# Patient Record
Sex: Male | Born: 1940
Health system: Southern US, Community
[De-identification: ages and names within clinical notes are randomized; demographics above are authoritative.]

## PROBLEM LIST (undated history)

## (undated) DIAGNOSIS — I82409 Acute embolism and thrombosis of unspecified deep veins of unspecified lower extremity: Secondary | ICD-10-CM

## (undated) DIAGNOSIS — I10 Essential (primary) hypertension: Secondary | ICD-10-CM

## (undated) DIAGNOSIS — M1990 Unspecified osteoarthritis, unspecified site: Secondary | ICD-10-CM

## (undated) DIAGNOSIS — I350 Nonrheumatic aortic (valve) stenosis: Secondary | ICD-10-CM

## (undated) DIAGNOSIS — I739 Peripheral vascular disease, unspecified: Secondary | ICD-10-CM

## (undated) DIAGNOSIS — R079 Chest pain, unspecified: Secondary | ICD-10-CM

## (undated) DIAGNOSIS — L98499 Non-pressure chronic ulcer of skin of other sites with unspecified severity: Secondary | ICD-10-CM

## (undated) DIAGNOSIS — H9319 Tinnitus, unspecified ear: Secondary | ICD-10-CM

## (undated) DIAGNOSIS — Z8719 Personal history of other diseases of the digestive system: Secondary | ICD-10-CM

## (undated) DIAGNOSIS — R011 Cardiac murmur, unspecified: Secondary | ICD-10-CM

## (undated) DIAGNOSIS — Z973 Presence of spectacles and contact lenses: Secondary | ICD-10-CM

## (undated) DIAGNOSIS — IMO0002 Reserved for concepts with insufficient information to code with codable children: Secondary | ICD-10-CM

## (undated) DIAGNOSIS — I2699 Other pulmonary embolism without acute cor pulmonale: Secondary | ICD-10-CM

## (undated) DIAGNOSIS — I4891 Unspecified atrial fibrillation: Secondary | ICD-10-CM

## (undated) DIAGNOSIS — Z972 Presence of dental prosthetic device (complete) (partial): Secondary | ICD-10-CM

## (undated) DIAGNOSIS — Z86718 Personal history of other venous thrombosis and embolism: Secondary | ICD-10-CM

## (undated) DIAGNOSIS — I639 Cerebral infarction, unspecified: Secondary | ICD-10-CM

## (undated) DIAGNOSIS — N400 Enlarged prostate without lower urinary tract symptoms: Secondary | ICD-10-CM

## (undated) DIAGNOSIS — M329 Systemic lupus erythematosus, unspecified: Secondary | ICD-10-CM

## (undated) DIAGNOSIS — K219 Gastro-esophageal reflux disease without esophagitis: Secondary | ICD-10-CM

## (undated) DIAGNOSIS — B029 Zoster without complications: Secondary | ICD-10-CM

## (undated) HISTORY — DX: Chest pain, unspecified: R07.9

## (undated) HISTORY — PX: HERNIA REPAIR: SHX51

## (undated) HISTORY — DX: Benign prostatic hyperplasia without lower urinary tract symptoms: N40.0

## (undated) HISTORY — PX: KNEE ARTHROSCOPY: SUR90

## (undated) HISTORY — DX: Peripheral vascular disease, unspecified: I73.9

## (undated) HISTORY — DX: Nonrheumatic aortic (valve) stenosis: I35.0

## (undated) HISTORY — PX: IVC FILTER INSERTION: CATH118245

## (undated) HISTORY — DX: Essential (primary) hypertension: I10

## (undated) HISTORY — DX: Personal history of other venous thrombosis and embolism: Z86.718

## (undated) HISTORY — PX: MULTIPLE TOOTH EXTRACTIONS: SHX2053

## (undated) HISTORY — PX: OTHER SURGICAL HISTORY: SHX169

---

## 2006-05-22 ENCOUNTER — Ambulatory Visit (HOSPITAL_COMMUNITY): Admission: RE | Admit: 2006-05-22 | Discharge: 2006-05-22 | Payer: Self-pay | Admitting: General Surgery

## 2007-06-19 ENCOUNTER — Ambulatory Visit (HOSPITAL_BASED_OUTPATIENT_CLINIC_OR_DEPARTMENT_OTHER): Admission: RE | Admit: 2007-06-19 | Discharge: 2007-06-19 | Payer: Self-pay | Admitting: Orthopedic Surgery

## 2008-10-01 DIAGNOSIS — Z8673 Personal history of transient ischemic attack (TIA), and cerebral infarction without residual deficits: Secondary | ICD-10-CM

## 2008-10-01 DIAGNOSIS — I639 Cerebral infarction, unspecified: Secondary | ICD-10-CM

## 2008-10-01 HISTORY — DX: Personal history of transient ischemic attack (TIA), and cerebral infarction without residual deficits: Z86.73

## 2008-10-01 HISTORY — DX: Cerebral infarction, unspecified: I63.9

## 2009-03-13 ENCOUNTER — Ambulatory Visit: Payer: Self-pay | Admitting: Cardiovascular Disease

## 2009-03-13 ENCOUNTER — Inpatient Hospital Stay (HOSPITAL_COMMUNITY): Admission: EM | Admit: 2009-03-13 | Discharge: 2009-03-16 | Payer: Self-pay | Admitting: Cardiology

## 2009-03-13 ENCOUNTER — Encounter: Payer: Self-pay | Admitting: Cardiology

## 2009-03-14 ENCOUNTER — Encounter: Payer: Self-pay | Admitting: Cardiology

## 2009-03-24 ENCOUNTER — Telehealth (INDEPENDENT_AMBULATORY_CARE_PROVIDER_SITE_OTHER): Payer: Self-pay

## 2009-03-28 ENCOUNTER — Encounter: Payer: Self-pay | Admitting: Cardiovascular Disease

## 2009-03-28 ENCOUNTER — Ambulatory Visit: Payer: Self-pay

## 2009-03-30 ENCOUNTER — Ambulatory Visit: Payer: Self-pay | Admitting: Cardiology

## 2009-06-07 ENCOUNTER — Encounter: Admission: RE | Admit: 2009-06-07 | Discharge: 2009-06-07 | Payer: Self-pay | Admitting: Rheumatology

## 2009-06-22 DIAGNOSIS — R079 Chest pain, unspecified: Secondary | ICD-10-CM

## 2009-06-22 DIAGNOSIS — I10 Essential (primary) hypertension: Secondary | ICD-10-CM | POA: Insufficient documentation

## 2009-11-25 ENCOUNTER — Ambulatory Visit (HOSPITAL_COMMUNITY): Admission: RE | Admit: 2009-11-25 | Discharge: 2009-11-25 | Payer: Self-pay | Admitting: General Surgery

## 2010-12-20 LAB — BASIC METABOLIC PANEL
Calcium: 9.5 mg/dL (ref 8.4–10.5)
GFR calc Af Amer: >60 mL/min (ref >60)
GFR calc non Af Amer: >60 mL/min (ref >60)
Sodium: 138 mEq/L (ref 135–145)

## 2010-12-20 LAB — CBC
Hemoglobin: 12.8 g/dL — ABNORMAL LOW (ref 13.0–17.0)
RBC: 4.3 MIL/uL (ref 4.22–5.81)
RDW: 15.6 % — ABNORMAL HIGH (ref 11.5–15.5)
WBC: 4.8 10*3/uL (ref 4.0–10.5)

## 2010-12-20 LAB — APTT: aPTT: 33 seconds (ref 24–37)

## 2010-12-20 LAB — PROTIME-INR: INR: 1.16 (ref 0.00–1.49)

## 2011-01-08 LAB — LIPID PANEL
HDL: 23 mg/dL — ABNORMAL LOW (ref >39)
LDL Cholesterol: 69 mg/dL (ref 0–99)
Total CHOL/HDL Ratio: 4.5 RATIO
Triglycerides: 54 mg/dL (ref <150)
VLDL: 11 mg/dL (ref 0–40)

## 2011-01-08 LAB — CARDIAC PANEL(CRET KIN+CKTOT+MB+TROPI)
CK, MB: 8 ng/mL — ABNORMAL HIGH (ref 0.3–4.0)
Relative Index: 6.5 — ABNORMAL HIGH (ref 0.0–2.5)
Relative Index: 6.8 — ABNORMAL HIGH (ref 0.0–2.5)
Total CK: 164 U/L (ref 7–232)
Troponin I: 0.01 ng/mL (ref 0.00–0.06)

## 2011-01-08 LAB — URINALYSIS, ROUTINE W REFLEX MICROSCOPIC
Nitrite: NEGATIVE
Specific Gravity, Urine: 1.023 (ref 1.005–1.030)
Urobilinogen, UA: 0.2 mg/dL (ref 0.0–1.0)
pH: 6 (ref 5.0–8.0)

## 2011-01-08 LAB — COMPREHENSIVE METABOLIC PANEL
ALT: 39 U/L (ref 0–53)
Alkaline Phosphatase: 57 U/L (ref 39–117)
CO2: 26 mEq/L (ref 19–32)
Calcium: 8.9 mg/dL (ref 8.4–10.5)
GFR calc non Af Amer: >60 mL/min (ref >60)
Glucose, Bld: 135 mg/dL — ABNORMAL HIGH (ref 70–99)
Potassium: 3.7 mEq/L (ref 3.5–5.1)
Sodium: 135 mEq/L (ref 135–145)

## 2011-01-08 LAB — BASIC METABOLIC PANEL
BUN: 10 mg/dL (ref 6–23)
BUN: 19 mg/dL (ref 6–23)
Calcium: 8.1 mg/dL — ABNORMAL LOW (ref 8.4–10.5)
GFR calc Af Amer: >60 mL/min (ref >60)
GFR calc non Af Amer: >60 mL/min (ref >60)
GFR calc non Af Amer: >60 mL/min (ref >60)
Glucose, Bld: 111 mg/dL — ABNORMAL HIGH (ref 70–99)
Potassium: 3.5 mEq/L (ref 3.5–5.1)
Potassium: 3.6 mEq/L (ref 3.5–5.1)
Sodium: 138 mEq/L (ref 135–145)
Sodium: 133 mEq/L — ABNORMAL LOW (ref 135–145)

## 2011-01-08 LAB — CBC
HCT: 32.7 % — ABNORMAL LOW (ref 39.0–52.0)
Hemoglobin: 11.3 g/dL — ABNORMAL LOW (ref 13.0–17.0)
MCHC: 34.2 g/dL (ref 30.0–36.0)
MCHC: 34.5 g/dL (ref 30.0–36.0)
Platelets: 140 10*3/uL — ABNORMAL LOW (ref 150–400)
RDW: 14.9 % (ref 11.5–15.5)
RDW: 15.2 % (ref 11.5–15.5)

## 2011-01-08 LAB — ANA: Anti Nuclear Antibody(ANA): POSITIVE — AB

## 2011-01-08 LAB — DIFFERENTIAL
Basophils Absolute: 0 10*3/uL (ref 0.0–0.1)
Basophils Absolute: 0 10*3/uL (ref 0.0–0.1)
Basophils Relative: 0 % (ref 0–1)
Basophils Relative: 0 % (ref 0–1)
Eosinophils Relative: 2 % (ref 0–5)
Lymphocytes Relative: 15 % (ref 12–46)
Monocytes Absolute: 0.8 10*3/uL (ref 0.1–1.0)
Neutro Abs: 6.4 10*3/uL (ref 1.7–7.7)
Neutrophils Relative %: 76 % (ref 43–77)

## 2011-01-08 LAB — PROTIME-INR: Prothrombin Time: 14.1 seconds (ref 11.6–15.2)

## 2011-01-08 LAB — HEPARIN LEVEL (UNFRACTIONATED)
Heparin Unfractionated: 0.23 IU/mL — ABNORMAL LOW (ref 0.30–0.70)
Heparin Unfractionated: 0.43 IU/mL (ref 0.30–0.70)

## 2011-01-08 LAB — CULTURE, BLOOD (ROUTINE X 2)

## 2011-01-08 LAB — MAGNESIUM: Magnesium: 2 mg/dL (ref 1.5–2.5)

## 2011-01-08 LAB — ANTI-NUCLEAR AB-TITER (ANA TITER)

## 2011-02-13 NOTE — Op Note (Signed)
NAME:  Jose Bush, Jose Bush NO.:  [id number]   MEDICAL RECORD NO.:  [id number]          PATIENT TYPE:  AMB   LOCATION:  [facility]                         FACILITY:  [facility]   PHYSICIAN:  Marlowe Kays, M.D.  DATE OF BIRTH:  July 20, 1941   DATE OF PROCEDURE:  06/19/2007  DATE OF DISCHARGE:                               OPERATIVE REPORT   PREOPERATIVE DIAGNOSES:  1. Torn medial meniscus.  2. Osteoarthritis right knee.   POSTOPERATIVE DIAGNOSES:  1. Torn medial meniscus.  2. Osteoarthritis right knee.   OPERATION:  Right knee arthroscopy with  1. Partial medial meniscectomy.  2. Generalized joint debridement and removal of several floaters.   SURGEON:  Marlowe Kays, M.D.   ASSISTANT:  Nurse.   ANESTHESIA:  General anesthesia, Burnett Corrente, M.D.   DESCRIPTION OF PROCEDURE:  He has bilaterally painful knees.  Recent MRI  of the right knee has demonstrated osteoarthritis and a posterior horn  tear of the medial meniscus.  Accordingly, he is here today for the  above-mentioned surgery.  It was thoroughly discussed with him and his  wife that the arthroscopic surgery was designed to take care of the  problems created by the torn medial meniscus and his knee may not be  appreciably improved with regard to the arthritis.   PROCEDURE:  Satisfactory general anesthesia, Ace wrap and knee support  to left lower extremity, pneumatic tourniquet to right lower extremity,  the leg Esmarch out non sterilely, thigh stabilizer applied.  Right leg  was prepped with DuraPrep from stabilizer to ankle and draped in a  sterile field.  Time-out performed.  Superior medial saline inflow.  First through an anteromedial portal, the lateral compartment of the  knee joint was evaluated.  His lateral joint was quite tight.  He had  some grade 1 to 2 wear of the lateral femoral condyle and lateral tibial  plateau, both of which I debrided down.  Looking up the lateral gutter  and  suprapatellar area, he had at least one loose fragment of articular  cartilage floating which I removed and of good bit of wear on the  superior portion of the lateral femoral condyle and the intercondylar  notch which I shaved down.  Pictures were taken. His patella did not  require any treatment.  I then reversed portals.  He had some scuffing  of the anterior third of the medial meniscus which was stable on  probing.  He had grade 3/4 wear of the medial femoral condyle which I  shaved down until smooth.  Posteriorly, he had significant fraying of a  major portion of the posterior half of the medial meniscus which I  shaved down to a stable rim on probing.  The knee joint was then  irrigated until clear and all fluid possible removed.  The drainage  portals were closed with 4-0 nylon.  I injected 20 mL of 0.25% Marcaine  with adrenalin and 4 mg of  morphine through the inflow apparatus which was removed and this portal  closed with 4-0 nylon as well.  Betadine and Adaptic and dry  sterile  dressing were applied.  Tourniquet was released.  He tolerated the  procedure well and at time of dictation was on the way to the recovery  room satisfactory condition with no known complications.           ______________________________  Marlowe Kays, M.D.     JA/MEDQ  D:  06/19/2007  T:  06/19/2007  Job:  [id number]

## 2011-02-13 NOTE — Discharge Summary (Signed)
NAME:  Jose Bush, Jose Bush NO.:  [id number]   MEDICAL RECORD NO.:  [id number]          PATIENT TYPE:  INP   LOCATION:  6526                         FACILITY:  [facility]   PHYSICIAN:  Rollene Rotunda, MD, FACCDATE OF BIRTH:  1940-11-12   DATE OF ADMISSION:  03/13/2009  DATE OF DISCHARGE:  03/16/2009                               DISCHARGE SUMMARY   PROCEDURES:  1. CT angiogram of the chest.  2. A 2-D echocardiogram.   PRIMARY FINAL DISCHARGE DIAGNOSIS:  Pneumonia.   SECONDARY DIAGNOSES:  1. Atypical chest pain with elevated CK-MBs and normal troponins,      outpatient stress testing plans.  2. Remote history of peptic ulcer disease.  3. Family history of coronary artery disease in his brother.  4. Remote history of tobacco use.  5. Hypertension.  6. Hyponatremia, resolved.   TIME OF DISCHARGE:  35 minutes.   HOSPITAL COURSE:  Jose Bush is a 70 year old male with no previous  history of coronary artery disease.  He went to [facility] with  left-sided chest pain and was transferred to [facility] for further  evaluation and treatment.   His chest x-ray showed possible pneumonia.  There was concern for PE  because his pain was atypical, so a CT angiogram of the chest was  performed.  There was no evidence of PE, but he had emphysema with  dependent, left greater than right lower lobe airspace opacities felt to  represent atelectasis or pneumonia with small amount of pleural fluid on  the left.  He was febrile and was started on antibiotics.   His fevers gradually improved.  He was coughing some, but this gradually  improved.  An ANA was positive and CRP was also elevated at 6.3.  This  could be followed as an outpatient.  His BNP was less than 30.  A sed  rate was also mildly elevated at 28.  A lipid profile showed total  cholesterol 103, triglycerides 54, HDL 23, LDL 69, so increasing his  activity is preferable with no medical therapy at this time.  An ANA  titer was 1:320.  The pattern was speckled.  The result may be  clinically significant and this will be followed as an outpatient.   By March 16, 2009, his O2 saturation was 90% or better on room air even  with ambulation.  He had no temperatures greater than 100 Fahrenheit.  His hyponatremia had resolved.  He had a beta-blocker added to his  medication regimen and was tolerating this well.  He was evaluated by  Dr. Antoine Poche who felt he could be changed to p.o. antibiotics (from  Zosyn to Avelox) and safely discharged home with outpatient stress  testing and followup.   DISCHARGE INSTRUCTIONS:  1. His activity level is to be increased gradually.  2. He is to do no lifting for 2 weeks.  3. He is to stick to a low-sodium heart-healthy diet.  4. He is to follow up with Dr. Antoine Poche in [place] on April 18, 2009      at 4:15.  5. He is to follow  up with his family physician as needed.  6. He is to get a stress test on March 28, 2009 at 8 a.m. with no food      or drink after midnight or before, but meds are okay with a sip of      water.   DISCHARGE MEDICATIONS:  1. Prilosec 20 mg daily.  2. Avelox 400 mg daily for 5 days.  3. Toprol-XL 25 mg a day.  4. Aspirin 81 mg a day.  5. Lisinopril HCT 20/25 mg daily.      Theodore Demark, PA-C      Rollene Rotunda, MD, [facility]  Electronically Signed    RB/MEDQ  D:  03/16/2009  T:  03/17/2009  Job:  [id number]

## 2011-02-13 NOTE — H&P (Signed)
NAME:  Jose Bush, Jose Bush NO.:  [id number]   MEDICAL RECORD NO.:  [id number]          PATIENT TYPE:  INP   LOCATION:  2910                         FACILITY:  [facility]   PHYSICIAN:  Brayton El, MD    DATE OF BIRTH:  12-May-1941   DATE OF ADMISSION:  03/13/2009  DATE OF DISCHARGE:                              HISTORY & PHYSICAL   CHIEF COMPLAINT:  Chest pain.   HISTORY OF PRESENT ILLNESS:  Mr. Jose Bush is a 70 year old white male with  past medical history significant for hypertension and history of tobacco  use who is presenting upon transfer from [facility] after 12  hours of unremitting left-sided chest discomfort.  The patient states  that the pain started this morning over his left anterior and lateral  chest.  He states that it has been sharp in nature and unremitting.  It  does not radiate and does not have any associated symptoms.  The patient  states that it is significantly worsened by deep inspiration.  The  patient denies any history of similar pain prior to today and has  otherwise been in his normal state of health.  Specifically, he denies  any lower extremity edema, PND, orthopnea, fevers, chills, cough,  hematochezia or melena.  In [facility] emergency room, the patient was  given aspirin, bolused with heparin and started on a heparin drip for  possible acute coronary syndrome as the patient's CK-MB was 19.  However, the patient's troponin was 0.01.  His EKG at that time was  unremarkable.  Upon arrival here to [facility], the patient complaining  of persistent 7/10 left-sided chest discomfort that has been somewhat  responsive to morphine and nitroglycerin.   PAST MEDICAL HISTORY:  As above.  In addition, the patient states he was  diagnosed with ulcer several years ago but has not had any difficulty  with that recently.   FAMILY HISTORY:  He has a brother who had bypass surgery in his early  51s.  However, there does not appear to be premature  coronary disease.   SOCIAL HISTORY:  Patient has extensive smoking history.  However, he  quit smoking in 1985.  He does not drink alcohol.   ALLERGIES:  NO KNOWN DRUG ALLERGIES ALTHOUGH HE STATES THAT HE THINKS HE  WAS ALLERGIC TO A TETANUS SHOT IN THE PAST.   MEDICATIONS:  The patient takes Prilosec and a blood pressure medicine  that he is unable to name.   REVIEW OF SYSTEMS:  As in HPI.  All other systems were reviewed and are  negative.   PHYSICAL EXAMINATION:  VITAL SIGNS:  Temperature 100.8, pulse 83, blood  pressure 138/77.  He is satting 96% on 2 liters nasal cannula.  GENERAL:  He is mildly uncomfortable appearing.  HEENT:  Nonfocal.  NECK:  There is no JVD.  There are no carotid bruits.  There is no  lymphadenopathy.  HEART:  Regular rate and rhythm without murmur, rub or gallop.  S1-S2  are normal.  LUNGS: There are bibasilar crackles.  ABDOMEN:  Soft, nontender, nondistended.  EXTREMITIES:  Without  edema.  SKIN:  Warm and dry.  NEUROLOGICAL:  Exam is nonfocal.  Pulses:  The patient is 2+ bilateral  carotid and radial pulses.  PSYCHIATRIC:  The patient is appropriate with normal levels of insight.   LABORATORY DATA:  From the outside hospital, sodium 135, potassium 4.1,  chloride 102, BUN 10, creatinine 0.77, glucose 102.  White count 7.7,  hemoglobin 14.0 rheumatic 40.7, platelet count 170.  His total CK was  219, CK-MB was 19.7.  His troponin was 0.01.  EKG independently  interpreted by myself demonstrates some baseline artifact, normal sinus  rhythm, and nonspecific T-wave abnormality.   ASSESSMENT:  A 70 year old white male with tobacco use and hypertension  as risk factors for coronary disease presenting with atypical chest  pain, an increased CK-MB, normal troponin and unremarkable EKG.  The  differential diagnosis includes acute coronary syndrome, pulmonary  embolism, dissection or musculoskeletal origin.  The patient's  symptomatology is not really  consistent with acute coronary syndrome.  However, it does remain on the differential.  The patient's low grade  temperature does raise concern for infectious etiology such as  pneumonia.  However, low grade fever could also be secondary to  pulmonary embolism.   PLAN:  1. Cardiac.  We will rule the patient out for myocardial infarction.      He has received aspirin, heparin bolus and is on the nitroglycerin      drip.  If the patient's troponin were to become positive, we would      initiate dual antiplatelet therapy by adding on a 2b/3 inhibitor.      Will also, at this time, add a statin to his medical regimen.  If      the patient were to rule in with positive troponin (which he should      upon next lab draw if this is indeed secondary to acute coronary      syndrome), he would be a suitable candidate for left heart      catheterization in the morning.  Aortic dissection is on the      differential, but much more lower down.  We will, however, check      blood pressures in bilateral arms at this time.  2. Pulmonary.  If the patient's first troponin is not significantly      positive, we will order a contrasted CT scan of the chest in order      to rule out pulmonary embolism.  A CT scan will also be helpful to      identify any potential infiltrate or pleural effusion that could be      the source of the patient's chest pain and/or low grade fever.  The      patient is currently on heparin which would be treating a pulmonary      embolism if indeed this is the etiology for his pain.  3. Low grade fever.  The patient has a temperature of 100.8.  This may      be secondary to an infectious etiology or prior pulmonary embolism.      We will check blood cultures x2, urinalysis and start with a chest      x-ray.  If the patient's pulmonary imaging reveals an infiltrate,      we will start antibiotic coverage for community-acquired pneumonia.  4. Prophylaxis.  The patient is currently on  IV heparin which will      service as his DVT prophylaxis.  Brayton El, MD  Electronically Signed     SGA/MEDQ  D:  03/13/2009  T:  03/14/2009  Job:  [id number]

## 2011-02-13 NOTE — Assessment & Plan Note (Signed)
[facility]                          [facility] OFFICE NOTE   Jose Bush, Jose Bush                      MRN:          [id number]  DATE:03/30/2009                            DOB:          [birth year]    REASON FOR PRESENTATION:  Evaluate the patient with chest pain.   HISTORY OF PRESENT ILLNESS:  The patient was hospitalized on March 13, 2009, with chest discomfort.  He had a negative troponin though his CK-  MB was slightly elevated during that hospitalization.  However, he had  no evidence of EKG changes consistent with obstructive coronary artery  disease.  His pain was quite pleuritic in nature.  There was a  questionable pneumonia on chest x-ray and he was treated for this with  initial improvement in his chest pain.  He was relatively painfree at  the time of discharge.  He did have an angiogram, which demonstrated no  evidence of pulmonary embolism.  Since discharge, he did have a stress  perfusion study demonstrating well-preserved ejection fraction of 70%  with no evidence of ischemia or infarct.  Of note, the patient did have  an echocardiogram as well on March 14, 2009, demonstrating no pericardial  effusion or regional wall motion abnormalities.   Since going home, he has had some left-sided chest pain.  He points to  the small lump under his skin.  He describes that from there, which is  an area below his left rib in axillary line, he has pain that radiates  up his chest.  It can be 5/6 in intensity.  It was severe the night  before this presentation.  It is positional.  It seems to be better when  he lies flat.  It is worse lying on his left side.  He has taken some  Tylenol to get rid of this.  He is noticing at night, but not during the  day.  He cannot bring it on with activity.  Three days ago, he thought  he had a low grade temperature and chills.  He has had mild cough  nonproductive.  He is not short of breath.  He is able to  use his  spirometer at home without difficulties.  He has not had any nausea or  vomiting.  He has had no edema or weight gain.   PAST MEDICAL HISTORY:  1. Peptic ulcer disease.  2. Hypertension.  3. Previous hyponatremia.   ALLERGIES:  None, although he thought he had a reaction to TETANUS SHOT  in the past.   MEDICATIONS:  Prilosec 20 mg over-the-counter, Toprol 25 mg daily,  aspirin 81 mg daily, lisinopril and hydrochlorothiazide 20/25 daily,  multivitamin, saw palmetto, fish oil.   REVIEW OF SYSTEMS:  As stated in the HPI, otherwise negative for all  other systems.   PHYSICAL EXAMINATION:  GENERAL:  The patient is in no distress.  VITAL SIGNS:  Blood pressure 130/72, heart rate 85 and regular, 96%  saturation on room air, weight 200 pounds.  HEENT:  Eyelids unremarkable.  Pupils equal, round, and reactive to  light.  Fundi  not visualized.  Oral mucosa unremarkable.  NECK:  No jugular venous distention at 45 degrees.  Carotid upstroke  brisk and symmetrical.  No bruits.  No thyromegaly.  LYMPHATICS:  No cervical, axillary, inguinal adenopathy.  LUNGS:  Clear to auscultation bilaterally.  BACK:  No costovertebral angle tenderness.  CHEST:  There is a slight soft 2 x 3 cm nodule in the axillary line  below the ribs, most likely a lipoma.  HEART:  PMI not displaced or sustained.  S1 and S2 within normal limits.  No S3.  No S4.  No clicks.  No rubs.  No murmurs.  ABDOMEN:  Flat, positive bowel sounds, normal in frequency and pitch.  No bruits.  No rebound, guarding, or midline pulsatile mass.  No  hepatomegaly or splenomegaly.  SKIN:  No rashes.  No nodules.  EXTREMITIES:  2+ pulses throughout.  No edema.  No cyanosis.  No  clubbing.  NEUROLOGIC:  Oriented to person, place, and time.  Cranial nerves II  through XII grossly intact.  Motor grossly intact.   EKG:  Sinus rhythm, rate 83, axis within normal limits, intervals within  normal limits, probable early repolarization  pattern, no acute ST-T wave  changes.   ASSESSMENT AND PLAN:  1. Chest discomfort.  The patient has chest discomfort.  He does not      have any anginal quality to it.  He has a negative stress perfusion      study.  For now, it seems to be more musculoskeletal.  However, if      this persists and there is no other clear etiology, I would want to      reevaluate him.  I do not believe there is any evidence of      pericarditis.  There is no rub.  There are no clear ST changes on      EKG though there is a slight early repolarization pattern.  I will      discuss this with Dr. Dimas Aguas.  I have asked the patient to call and      have a followup with him.  If his pain persists or recurs, I would      be happy to see him back.  2. Hypertension.  Blood pressure is well controlled and he will      continue the medications as listed.     Rollene Rotunda, MD, [facility]  Electronically Signed    JH/MedQ  DD: 03/30/2009  DT: 03/31/2009  Job #: [id number]

## 2011-02-16 NOTE — H&P (Signed)
NAME:  KEYGAN, DUMOND NO.:  [id number]   MEDICAL RECORD NO.:  [id number]         PATIENT TYPE:  AMB   LOCATION:                                FACILITY:  [facility]   PHYSICIAN:  Dalia Heading, M.D.  DATE OF BIRTH:  1940/10/25   DATE OF ADMISSION:  DATE OF DISCHARGE:  LH                                HISTORY & PHYSICAL   CHIEF COMPLAINT:  Dysphagia.   HISTORY OF PRESENT ILLNESS:  The patient is a 70 year old white male who is  referred for endoscopic evaluation.  He needs an EGD for dysphagia.  He has  been having dysphagia with some solid food intermittently for over a year.  He does not smoke.  Does have a history of GERD.  He has never been treated  for this.  No abdominal pain, weight loss, nausea, vomiting, diarrhea,  constipation, melena, hematochezia have been noted.  He has had a  colonoscopy in the past.   CURRENT MEDICATIONS:  None.   ALLERGIES:  NO KNOWN DRUG ALLERGIES.   PAST MEDICAL HISTORY:  Unremarkable.   PAST SURGICAL HISTORY:  Unremarkable.   REVIEW OF SYSTEMS:  Noncontributory.   PHYSICAL EXAMINATION:  GENERAL APPEARANCE:  The patient is a well-developed,  well-nourished white male in no acute distress.  NECK:  Neck is supple without lymphadenopathy.  LUNGS:  Clear to auscultation with equal breath sounds bilaterally.  HEART:  Examination reveals a regular rate and rhythm without S3, S4 or  murmurs.  ABDOMEN:  The abdomen is soft, nontender, nondistended.  No  hepatosplenomegaly or masses noted.   IMPRESSION:  Dysphagia.   PLAN:  The patient is scheduled for an EGD with possible dilatation on  May 22, 2006.  The risks and benefits of the procedure including bleeding  and perforation were fully explained to the patient, gave informed consent.      Dalia Heading, M.D.  Electronically Signed     MAJ/MEDQ  D:  05/16/2006  T:  05/16/2006  Job:  [id number]   cc:   Selinda Flavin  Fax: [phone]

## 2011-03-28 ENCOUNTER — Encounter: Payer: Self-pay | Admitting: Cardiology

## 2011-07-12 LAB — I-STAT 8, (EC8 V) (CONVERTED LAB)
BUN: 21
Bicarbonate: 22.6
Glucose, Bld: 93
TCO2: 24
pCO2, Ven: 31.2 — ABNORMAL LOW
pH, Ven: 7.469 — ABNORMAL HIGH

## 2012-11-28 ENCOUNTER — Other Ambulatory Visit (HOSPITAL_COMMUNITY): Payer: Self-pay | Admitting: Rheumatology

## 2012-12-02 ENCOUNTER — Ambulatory Visit (HOSPITAL_COMMUNITY)
Admission: RE | Admit: 2012-12-02 | Discharge: 2012-12-02 | Disposition: A | Discharge status: Home / Self Care | Payer: Medicare Other | Source: Ambulatory Visit | Attending: Rheumatology | Admitting: Rheumatology

## 2012-12-02 DIAGNOSIS — Z13820 Encounter for screening for osteoporosis: Secondary | ICD-10-CM | POA: Insufficient documentation

## 2012-12-02 DIAGNOSIS — IMO0002 Reserved for concepts with insufficient information to code with codable children: Secondary | ICD-10-CM | POA: Insufficient documentation

## 2013-02-01 ENCOUNTER — Encounter (HOSPITAL_COMMUNITY): Payer: Self-pay | Admitting: Emergency Medicine

## 2013-02-01 ENCOUNTER — Emergency Department (HOSPITAL_COMMUNITY): Payer: Medicare Other

## 2013-02-01 ENCOUNTER — Inpatient Hospital Stay (HOSPITAL_COMMUNITY)
Admission: EM | Admit: 2013-02-01 | Discharge: 2013-02-06 | DRG: 803 | Disposition: A | Discharge status: Home / Self Care | Payer: Medicare Other | Attending: Internal Medicine | Admitting: Internal Medicine

## 2013-02-01 DIAGNOSIS — M329 Systemic lupus erythematosus, unspecified: Secondary | ICD-10-CM | POA: Diagnosis present

## 2013-02-01 DIAGNOSIS — R5383 Other fatigue: Secondary | ICD-10-CM

## 2013-02-01 DIAGNOSIS — IMO0002 Reserved for concepts with insufficient information to code with codable children: Secondary | ICD-10-CM | POA: Diagnosis present

## 2013-02-01 DIAGNOSIS — D696 Thrombocytopenia, unspecified: Secondary | ICD-10-CM | POA: Diagnosis present

## 2013-02-01 DIAGNOSIS — D709 Neutropenia, unspecified: Principal | ICD-10-CM | POA: Diagnosis present

## 2013-02-01 DIAGNOSIS — R5081 Fever presenting with conditions classified elsewhere: Secondary | ICD-10-CM | POA: Diagnosis present

## 2013-02-01 DIAGNOSIS — I82409 Acute embolism and thrombosis of unspecified deep veins of unspecified lower extremity: Secondary | ICD-10-CM | POA: Diagnosis present

## 2013-02-01 DIAGNOSIS — R509 Fever, unspecified: Secondary | ICD-10-CM

## 2013-02-01 DIAGNOSIS — R410 Disorientation, unspecified: Secondary | ICD-10-CM

## 2013-02-01 DIAGNOSIS — Z8249 Family history of ischemic heart disease and other diseases of the circulatory system: Secondary | ICD-10-CM

## 2013-02-01 DIAGNOSIS — R270 Ataxia, unspecified: Secondary | ICD-10-CM

## 2013-02-01 DIAGNOSIS — D61818 Other pancytopenia: Secondary | ICD-10-CM | POA: Diagnosis present

## 2013-02-01 DIAGNOSIS — R6889 Other general symptoms and signs: Secondary | ICD-10-CM | POA: Diagnosis present

## 2013-02-01 DIAGNOSIS — R51 Headache: Secondary | ICD-10-CM | POA: Diagnosis present

## 2013-02-01 DIAGNOSIS — R531 Weakness: Secondary | ICD-10-CM | POA: Diagnosis present

## 2013-02-01 DIAGNOSIS — R519 Headache, unspecified: Secondary | ICD-10-CM | POA: Diagnosis present

## 2013-02-01 DIAGNOSIS — Z87891 Personal history of nicotine dependence: Secondary | ICD-10-CM

## 2013-02-01 DIAGNOSIS — Z86718 Personal history of other venous thrombosis and embolism: Secondary | ICD-10-CM

## 2013-02-01 DIAGNOSIS — I82402 Acute embolism and thrombosis of unspecified deep veins of left lower extremity: Secondary | ICD-10-CM

## 2013-02-01 DIAGNOSIS — Z7901 Long term (current) use of anticoagulants: Secondary | ICD-10-CM

## 2013-02-01 DIAGNOSIS — I10 Essential (primary) hypertension: Secondary | ICD-10-CM | POA: Diagnosis present

## 2013-02-01 HISTORY — DX: Systemic lupus erythematosus, unspecified: M32.9

## 2013-02-01 HISTORY — DX: Reserved for concepts with insufficient information to code with codable children: IMO0002

## 2013-02-01 HISTORY — DX: Acute embolism and thrombosis of unspecified deep veins of unspecified lower extremity: I82.409

## 2013-02-01 LAB — CBC WITH DIFFERENTIAL/PLATELET
Basophils Relative: 1 % (ref 0–1)
Eosinophils Absolute: 0 10*3/uL (ref 0.0–0.7)
Eosinophils Relative: 0 % (ref 0–5)
HCT: 38.1 % — ABNORMAL LOW (ref 39.0–52.0)
Hemoglobin: 13.3 g/dL (ref 13.0–17.0)
MCH: 30.4 pg (ref 26.0–34.0)
MCHC: 34.9 g/dL (ref 30.0–36.0)
MCV: 87.2 fL (ref 78.0–100.0)
Monocytes Absolute: 0.2 10*3/uL (ref 0.1–1.0)
Monocytes Relative: 5 % (ref 3–12)
Neutro Abs: 3.6 10*3/uL (ref 1.7–7.7)
RDW: 15.1 % (ref 11.5–15.5)

## 2013-02-01 LAB — URINALYSIS, ROUTINE W REFLEX MICROSCOPIC
Bilirubin Urine: NEGATIVE
Hgb urine dipstick: NEGATIVE
Leukocytes, UA: NEGATIVE
Nitrite: NEGATIVE
Protein, ur: NEGATIVE mg/dL
Specific Gravity, Urine: 1.019 (ref 1.005–1.030)

## 2013-02-01 LAB — COMPREHENSIVE METABOLIC PANEL
ALT: 53 U/L (ref 0–53)
AST: 61 U/L — ABNORMAL HIGH (ref 0–37)
Albumin: 3.4 g/dL — ABNORMAL LOW (ref 3.5–5.2)
CO2: 22 mEq/L (ref 19–32)
Calcium: 9 mg/dL (ref 8.4–10.5)
Chloride: 100 mEq/L (ref 96–112)
GFR calc non Af Amer: 82 mL/min — ABNORMAL LOW (ref >90)
Sodium: 134 mEq/L — ABNORMAL LOW (ref 135–145)

## 2013-02-01 LAB — PROTIME-INR: INR: 1.72 — ABNORMAL HIGH (ref 0.00–1.49)

## 2013-02-01 LAB — POCT I-STAT TROPONIN I: Troponin i, poc: 0.01 ng/mL (ref 0.00–0.08)

## 2013-02-01 MED ORDER — WARFARIN SODIUM 2.5 MG PO TABS
2.5000 mg | ORAL_TABLET | Freq: Once | ORAL | Status: AC
  Administered 2013-02-02: 2.5 mg via ORAL
  Filled 2013-02-01: qty 1

## 2013-02-01 MED ORDER — SODIUM CHLORIDE 0.9 % IV SOLN
INTRAVENOUS | Status: DC
Start: 2013-02-01 — End: 2013-02-06
  Administered 2013-02-02 – 2013-02-03 (×3): via INTRAVENOUS
  Administered 2013-02-04: 1000 mL via INTRAVENOUS
  Administered 2013-02-05: 23:00:00 via INTRAVENOUS

## 2013-02-01 MED ORDER — WARFARIN - PHARMACIST DOSING INPATIENT
Freq: Every day | Status: DC
  Administered 2013-02-02: 19:00:00

## 2013-02-01 MED ORDER — FISH OIL 1200 MG PO CAPS: 1.0000 | ORAL_CAPSULE | Freq: Every morning | ORAL | Status: DC

## 2013-02-01 MED ORDER — DOXYCYCLINE HYCLATE 100 MG PO TABS
100.0000 mg | ORAL_TABLET | Freq: Two times a day (BID) | ORAL | Status: DC
  Administered 2013-02-02 – 2013-02-04 (×6): 100 mg via ORAL
  Filled 2013-02-01 (×7): qty 1

## 2013-02-01 MED ORDER — NITROGLYCERIN 0.4 MG SL SUBL: 0.4000 mg | SUBLINGUAL_TABLET | SUBLINGUAL | Status: DC | PRN

## 2013-02-01 MED ORDER — OMEGA-3-ACID ETHYL ESTERS 1 G PO CAPS
1.0000 g | ORAL_CAPSULE | Freq: Every day | ORAL | Status: DC
  Administered 2013-02-02 – 2013-02-06 (×5): 1 g via ORAL
  Filled 2013-02-01 (×5): qty 1

## 2013-02-01 MED ORDER — LISINOPRIL 10 MG PO TABS
10.0000 mg | ORAL_TABLET | Freq: Every day | ORAL | Status: DC
  Administered 2013-02-02 – 2013-02-06 (×5): 10 mg via ORAL
  Filled 2013-02-01 (×5): qty 1

## 2013-02-01 MED ORDER — PANTOPRAZOLE SODIUM 40 MG PO TBEC
40.0000 mg | DELAYED_RELEASE_TABLET | Freq: Every day | ORAL | Status: DC
  Administered 2013-02-02 – 2013-02-06 (×5): 40 mg via ORAL
  Filled 2013-02-01 (×5): qty 1

## 2013-02-01 MED ORDER — HYDROXYCHLOROQUINE SULFATE 200 MG PO TABS
200.0000 mg | ORAL_TABLET | Freq: Two times a day (BID) | ORAL | Status: DC
  Administered 2013-02-02 – 2013-02-04 (×5): 200 mg via ORAL
  Filled 2013-02-01 (×6): qty 1

## 2013-02-01 NOTE — H&P (Signed)
[facility] History and Physical  Jose Bush ZOX:[id number] DOB: 07-14-1941 DOA: 02/01/2013  Referring physician: ER Physician. PCP: Selinda Flavin, MD  Specialists: Dr. Kellie Simmering rheumatologist.  Chief Complaint: Weakness.  HPI: Jose Bush is a 72 y.o. male history of lupus, DVT on Coumadin, hypertension was brought to the ER after patient has been having weakness headache. Earlier today around 8:30 AM patient had gone to the church and over there patient was found to be confused weak and slow in doing things. Patient's gait was not normal. But patient did not lose consciousness or did not have any loss of function of the upper or lower extremities. Patient has been experiencing headache and neck pain for the last 3 days and subjective feeling of chills and rigors. His headache is mostly on the frontal area. Denies any associated blurred vision difficulty speaking or swallowing. Patient states that he has been experiencing chills and rigors last 2 nights but there was no recorded fever. Denies any chest pain has had mild shortness of breath denies any productive cough nausea vomiting abdominal pain diarrhea or dysuria. Denies any insect bites. In the ER patient was found to be afebrile and nonfocal and his symptoms are largely resolved. CT head was negative for any acute. Labs revealed mild thrombocytopenia. UA chest x-ray unremarkable and EKG shows normal sinus rhythm. Patient at this time will be admitted for further management.  Patient has history of lupus and was under remission until 3 months ago when he started having chest pain and as per patient chest x-ray showed infiltrates and patient was placed on steroids and was eventually started on Plaquenil and is taken off steroids recently last one month ago.  Review of Systems: As presented in the history of presenting illness, rest negative.  Past Medical History  Diagnosis Date  . HTN (hypertension)   . Chest pain, unspecified    . DVT (deep venous thrombosis)   . Lupus    Past Surgical History  Procedure Laterality Date  . Hernia repair     Social History:  reports that he has quit smoking. He does not have any smokeless tobacco history on file. He reports that he does not drink alcohol or use illicit drugs. Lives at home. where does patient live-- Can do ADLs. Can patient participate in ADLs?  No Known Allergies  Family History  Problem Relation Age of Onset  . Hypertension Other   . Stroke Mother       Prior to Admission medications   Medication Sig Start Date End Date Taking? Authorizing Provider  Calcium Carbonate-Vitamin D (CALCIUM + D PO) Take 1 tablet by mouth every morning.   Yes Historical Provider, MD  hydroxychloroquine (PLAQUENIL) 200 MG tablet Take 200 mg by mouth 2 (two) times daily.   Yes Historical Provider, MD  lisinopril-hydrochlorothiazide (PRINZIDE,ZESTORETIC) 10-12.5 MG per tablet Take 1 tablet by mouth every morning.   Yes Historical Provider, MD  Multiple Vitamin (MULTIVITAMIN WITH MINERALS) TABS Take 1 tablet by mouth every morning.   Yes Historical Provider, MD  nitroGLYCERIN (NITROSTAT) 0.4 MG SL tablet Place 0.4 mg under the tongue every 5 (five) minutes as needed for chest pain. x3 doses as needed for chest pain   Yes Historical Provider, MD  Omega-3 Fatty Acids (FISH OIL) 1200 MG CAPS Take 1 capsule by mouth every morning.   Yes Historical Provider, MD  omeprazole (PRILOSEC) 20 MG capsule Take 20 mg by mouth every morning.   Yes Historical Provider, MD  warfarin (COUMADIN) 5 MG tablet Take 5 mg by mouth every morning.   Yes Historical Provider, MD   Physical Exam: Filed Vitals:   02/01/13 2000 02/01/13 2100 02/01/13 2130 02/01/13 2200  BP:  104/47 126/69 137/73  Pulse: 75 67 73 81  Temp:      TempSrc:      Resp: 21 26 24 25   SpO2: 96% 97% 99% 99%     General:  Well-developed and nourished.  Eyes: Anicteric no pallor.  ENT: No discharge from the ears eyes nose or  mouth.  Neck: No neck rigidity. No mass felt.  Cardiovascular: S1-S2 heard.  Respiratory: No rhonchi or crepitations.  Abdomen: Soft nontender bowel sounds present.  Skin: No rash.  Musculoskeletal: No edema.  Psychiatric: Appears normal.  Neurologic: Alert awake oriented to time place and person. Moves all extremities 5 x 5. No facial symmetry. Tongue is midline. PERRLA positive.  Labs on Admission:  Basic Metabolic Panel:  Recent Labs Lab 02/01/13 1734  NA 134*  K 3.7  CL 100  CO2 22  GLUCOSE 138*  BUN 21  CREATININE 0.94  CALCIUM 9.0   Liver Function Tests:  Recent Labs Lab 02/01/13 1734  AST 61*  ALT 53  ALKPHOS 46  BILITOT 0.6  PROT 6.8  ALBUMIN 3.4*   No results found for this basename: LIPASE, AMYLASE,  in the last 168 hours No results found for this basename: AMMONIA,  in the last 168 hours CBC:  Recent Labs Lab 02/01/13 1734  WBC 4.1  NEUTROABS 3.6  HGB 13.3  HCT 38.1*  MCV 87.2  PLT 100*   Cardiac Enzymes: No results found for this basename: CKTOTAL, CKMB, CKMBINDEX, TROPONINI,  in the last 168 hours  BNP (last 3 results) No results found for this basename: PROBNP,  in the last 8760 hours CBG: No results found for this basename: GLUCAP,  in the last 168 hours  Radiological Exams on Admission: Dg Chest 2 View  02/01/2013  *RADIOLOGY REPORT*  Clinical Data: Altered mental status.  CHEST - 2 VIEW  Comparison: 03/25/2012  Findings: Heart is normal size.  Stable mild chronic linear densities of the lungs, likely scarring.  No acute opacities or effusions.  No acute bony abnormality.  IMPRESSION: No acute cardiopulmonary disease.   Original Report Authenticated By: Charlett Nose, M.D.    Ct Head Wo Contrast  02/01/2013  *RADIOLOGY REPORT*  Clinical Data: Headache, dizziness  CT HEAD WITHOUT CONTRAST  Technique:  Contiguous axial images were obtained from the base of the skull through the vertex without contrast.  Comparison: Brain MRI 01/05/2010   Findings: No skull fracture is noted.  Paranasal sinuses mastoid air cells are unremarkable.  No intracranial hemorrhage, mass effect or midline shift.  No acute infarction.  No mass lesion is noted on this unenhanced scan.  Moderate cerebral atrophy. Small lacunar infarct left parietal lobe posteriorly is stable.  Stable periventricular and patchy subcortical chronic white matter disease.  IMPRESSION: No acute intracranial abnormality.  Stable atrophy and chronic white matter disease.   Original Report Authenticated By: Natasha Mead, M.D.     EKG: Independently reviewed. Normal sinus rhythm.  Assessment/Plan Principal Problem:   Weakness Active Problems:   HYPERTENSION, UNSPECIFIED   DVT (deep venous thrombosis)   Rigors   Headache   Lupus   Thrombocytopenia   1. Weakness with chills and rigors - patient is afebrile and nonfocal. At this time we have to primarily rule out any infectious source for  which blood cultures have been ordered. Since patient also has thrombocytopenia I have ordered Lyme titers and [facility] spotted fever titers and placed patient empirically on doxycycline. Given the history of lupus we will also check for any Lupus exacerbation for which C3 and C4 levels along with sedimentation rate has been ordered. TIA/stroke is also on the differentials. I have discussed with neurologist on-call Dr. Amada Jupiter. At this time they have recommended MRI brain and if positive for strokes in consult neurology. Since patient has generalized weakness and nonfocal this time stroke may be less likely but will be in the differential. Closely follow neuro checks and swallow evaluation. 2. Thrombocytopenia - see #1. Closely follow CBC with differentials. Patient is on doxycycline for now. Patient's headache is mild and on exam patient has no neck rigidity and patient is afebrile as explained earlier. Closely follow clinically. 3. Hypertension  - continue home medications. 4. History of DVT  - Coumadin per pharmacy.    Code Status: Full code.  Family Communication: Patient's wife at the bedside.  Disposition Plan: Admit to inpatient.    Cordera Stineman N. [facility] Pager [phone].  If 7PM-7AM, please contact night-coverage [email or web address] Password [facility] 02/01/2013, 10:21 PM

## 2013-02-01 NOTE — Progress Notes (Signed)
ANTICOAGULATION CONSULT NOTE - Initial Consult  Pharmacy Consult for warfarin Indication: H/o DVT  No Known Allergies  Patient Measurements:    Vital Signs: Temp: 98.4 F (36.9 C) (05/04 1700) Temp src: Oral (05/04 1700) BP: 137/73 mmHg (05/04 2200) Pulse Rate: 81 (05/04 2200)  Labs:  Recent Labs  02/01/13 1734  HGB 13.3  HCT 38.1*  PLT 100*  LABPROT 19.6*  INR 1.72*  CREATININE 0.94    The CrCl is unknown because both a height and weight (above a minimum accepted value) are required for this calculation.   Medical History: Past Medical History  Diagnosis Date  . HTN (hypertension)   . Chest pain, unspecified   . DVT (deep venous thrombosis)   . Lupus     Medications:  Scheduled:  . doxycycline  100 mg Oral Q12H  . [START ON 02/02/2013] hydroxychloroquine  200 mg Oral BID  . [START ON 02/02/2013] lisinopril  10 mg Oral Daily  . [START ON 02/02/2013] omega-3 acid ethyl esters  1 g Oral Daily  . [START ON 02/02/2013] pantoprazole  40 mg Oral Daily  . [DISCONTINUED] Fish Oil  1 capsule Oral q morning - 10a    Assessment: 72 yo male with h/o DVT (2010) presented with weakness and headache. Pharmacy to manage Coumadin. INR 1.72. Home Coumadin regimen of 5mg  daily with last dose taken earlier today.   Goal of Therapy:  INR 2-3 Monitor platelets by anticoagulation protocol: Yes   Plan:  1. Coumadin 2.5mg  po today (total of 7.5mg  Coumadin for today) 2. Daily PT / INR 3. Coumadin education with pharmacist 4. Could consider bridging with heparin until INR is at-goal.  Emeline Gins 02/01/2013,11:23 PM

## 2013-02-01 NOTE — ED Provider Notes (Signed)
History     CSN: [id number]  Arrival date & time 02/01/13  1654   First MD Initiated Contact with Patient 02/01/13 1702      Chief Complaint  Patient presents with  . Weakness  . Headache    (Consider location/radiation/quality/duration/timing/severity/associated sxs/prior treatment) Patient is a 72 y.o. male presenting with weakness and headaches. The history is provided by the patient.  Weakness Associated symptoms include chest pain and headaches. Pertinent negatives include no abdominal pain and no shortness of breath.  Headache Associated symptoms: fatigue and nausea   Associated symptoms: no abdominal pain, no back pain, no diarrhea, no pain, no neck stiffness, no numbness and no vomiting    patient states he's been feeling sick for the last 3 days. He states he has had a dull headache. He states it comes and goes. States will come on and go away. He states he's also had episodes of shaking in his whole body. He states he will come and go. He states he feels as if he has chills. States he also has had some confusion. His wife states that his been having more difficulty picking out clothes than he normally would.no difficulty seen. No cough. He states he has had some urinary frequency. No abdominal pain. He states he has had some occasional dull chest pain. He is on Coumadin for previous DVT. He is also had difficulty walking. Family member states he was shuffling in his gait.  Past Medical History  Diagnosis Date  . HTN (hypertension)   . Chest pain, unspecified   . DVT (deep venous thrombosis)     History reviewed. No pertinent past surgical history.  Family History  Problem Relation Age of Onset  . Hypertension Other     History  Substance Use Topics  . Smoking status: Former Games developer  . Smokeless tobacco: Not on file  . Alcohol Use: No      Review of Systems  Constitutional: Positive for chills and fatigue. Negative for activity change and appetite change.  HENT:  Negative for neck stiffness.   Eyes: Negative for pain.  Respiratory: Negative for chest tightness and shortness of breath.   Cardiovascular: Positive for chest pain. Negative for leg swelling.  Gastrointestinal: Positive for nausea. Negative for vomiting, abdominal pain and diarrhea.  Genitourinary: Negative for flank pain.  Musculoskeletal: Negative for back pain.  Skin: Negative for rash.  Neurological: Positive for weakness and headaches. Negative for numbness.  Psychiatric/Behavioral: Negative for behavioral problems.    Allergies  Review of patient's allergies indicates no known allergies.  Home Medications   Current Outpatient Rx  Name  Route  Sig  Dispense  Refill  . Calcium Carbonate-Vitamin D (CALCIUM + D PO)   Oral   Take 1 tablet by mouth every morning.         . hydroxychloroquine (PLAQUENIL) 200 MG tablet   Oral   Take 200 mg by mouth 2 (two) times daily.         Marland Kitchen lisinopril-hydrochlorothiazide (PRINZIDE,ZESTORETIC) 10-12.5 MG per tablet   Oral   Take 1 tablet by mouth every morning.         . Multiple Vitamin (MULTIVITAMIN WITH MINERALS) TABS   Oral   Take 1 tablet by mouth every morning.         . nitroGLYCERIN (NITROSTAT) 0.4 MG SL tablet   Sublingual   Place 0.4 mg under the tongue every 5 (five) minutes as needed for chest pain. x3 doses as needed for chest  pain         . Omega-3 Fatty Acids (FISH OIL) 1200 MG CAPS   Oral   Take 1 capsule by mouth every morning.         Marland Kitchen omeprazole (PRILOSEC) 20 MG capsule   Oral   Take 20 mg by mouth every morning.         . warfarin (COUMADIN) 5 MG tablet   Oral   Take 5 mg by mouth every morning.           BP 126/65  Pulse 69  Temp(Src) 98.4 F (36.9 C) (Oral)  Resp 26  SpO2 97%  Physical Exam  Nursing note and vitals reviewed. Constitutional: He is oriented to person, place, and time. He appears well-developed and well-nourished.  HENT:  Head: Normocephalic and atraumatic.   Bilateral TMs normal  Eyes: EOM are normal. Pupils are equal, round, and reactive to light.  Neck: Normal range of motion. Neck supple.  No meningeal signs.  Cardiovascular: Normal rate, regular rhythm and normal heart sounds.   No murmur heard. Pulmonary/Chest: Effort normal and breath sounds normal.  Abdominal: Soft. Bowel sounds are normal. He exhibits no distension and no mass. There is no tenderness. There is no rebound and no guarding.  Musculoskeletal: Normal range of motion. He exhibits edema.  Minimal bilateral lower extremity pitting edema.  Neurological: He is alert and oriented to person, place, and time. No cranial nerve deficit.  No nystagmus. Finger-nose negative bilaterally. No Romberg.  Skin: Skin is warm and dry.  Psychiatric: He has a normal mood and affect.    ED Course  Procedures (including critical care time)  Labs Reviewed  CBC WITH DIFFERENTIAL - Abnormal; Notable for the following:    HCT 38.1 (*)    Platelets 100 (*)    Neutrophils Relative 87 (*)    Lymphocytes Relative 8 (*)    Lymphs Abs 0.3 (*)    All other components within normal limits  COMPREHENSIVE METABOLIC PANEL - Abnormal; Notable for the following:    Sodium 134 (*)    Glucose, Bld 138 (*)    Albumin 3.4 (*)    AST 61 (*)    GFR calc non Af Amer 82 (*)    All other components within normal limits  PROTIME-INR - Abnormal; Notable for the following:    Prothrombin Time 19.6 (*)    INR 1.72 (*)    All other components within normal limits  URINALYSIS, ROUTINE W REFLEX MICROSCOPIC  POCT I-STAT TROPONIN I   Dg Chest 2 View  02/01/2013  *RADIOLOGY REPORT*  Clinical Data: Altered mental status.  CHEST - 2 VIEW  Comparison: 03/25/2012  Findings: Heart is normal size.  Stable mild chronic linear densities of the lungs, likely scarring.  No acute opacities or effusions.  No acute bony abnormality.  IMPRESSION: No acute cardiopulmonary disease.   Original Report Authenticated By: Charlett Nose,  M.D.    Ct Head Wo Contrast  02/01/2013  *RADIOLOGY REPORT*  Clinical Data: Headache, dizziness  CT HEAD WITHOUT CONTRAST  Technique:  Contiguous axial images were obtained from the base of the skull through the vertex without contrast.  Comparison: Brain MRI 01/05/2010  Findings: No skull fracture is noted.  Paranasal sinuses mastoid air cells are unremarkable.  No intracranial hemorrhage, mass effect or midline shift.  No acute infarction.  No mass lesion is noted on this unenhanced scan.  Moderate cerebral atrophy. Small lacunar infarct left parietal lobe posteriorly is stable.  Stable periventricular and patchy subcortical chronic white matter disease.  IMPRESSION: No acute intracranial abnormality.  Stable atrophy and chronic white matter disease.   Original Report Authenticated By: Natasha Mead, M.D.      No diagnosis found.   Date: 02/01/2013  Rate: 93  Rhythm: normal sinus rhythm  QRS Axis: normal  Intervals: normal  ST/T Wave abnormalities: normal  Conduction Disutrbances:none  Narrative Interpretation:   Old EKG Reviewed: none available    MDM  Patient presents with some altered mental status, headache, and previous shuffling gait. Lab work is overall reassuring. Head CT is negative. With previous altered mental status and also the shuffling gait patient will need evaluation by internal medicine and likely admission.        Juliet Rude. Rubin Payor, MD 02/04/13 [phone]

## 2013-02-01 NOTE — ED Notes (Signed)
Pt sts balance problems with frontal HA and chills x 3 days; pt sts recent treatment for DVT on left side and having some weakness per norm in that leg; no obvious neuro deficits

## 2013-02-01 NOTE — ED Notes (Signed)
Pt reports since Friday night he started to get chills and generalized weakness, then started to feel a little better yesterday but then last night it came back. This morning having same symptoms, but felt like he was walking slower than normal and unable to walk as far. Pt in nad, skin warm and dry, resp e/u.

## 2013-02-02 ENCOUNTER — Inpatient Hospital Stay (HOSPITAL_COMMUNITY): Payer: Medicare Other

## 2013-02-02 DIAGNOSIS — F29 Unspecified psychosis not due to a substance or known physiological condition: Secondary | ICD-10-CM

## 2013-02-02 DIAGNOSIS — M329 Systemic lupus erythematosus, unspecified: Secondary | ICD-10-CM

## 2013-02-02 DIAGNOSIS — R509 Fever, unspecified: Secondary | ICD-10-CM | POA: Diagnosis present

## 2013-02-02 DIAGNOSIS — D709 Neutropenia, unspecified: Secondary | ICD-10-CM | POA: Diagnosis present

## 2013-02-02 LAB — RAPID URINE DRUG SCREEN, HOSP PERFORMED
Barbiturates: NOT DETECTED
Tetrahydrocannabinol: NOT DETECTED

## 2013-02-02 LAB — CBC WITH DIFFERENTIAL/PLATELET
Basophils Absolute: 0 10*3/uL (ref 0.0–0.1)
HCT: 37.2 % — ABNORMAL LOW (ref 39.0–52.0)
Lymphocytes Relative: 16 % (ref 12–46)
Lymphs Abs: 0.4 10*3/uL — ABNORMAL LOW (ref 0.7–4.0)
Neutro Abs: 1.8 10*3/uL (ref 1.7–7.7)
Platelets: 71 10*3/uL — ABNORMAL LOW (ref 150–400)
RBC: 4.29 MIL/uL (ref 4.22–5.81)
RDW: 15.1 % (ref 11.5–15.5)
WBC: 2.4 10*3/uL — ABNORMAL LOW (ref 4.0–10.5)

## 2013-02-02 LAB — COMPREHENSIVE METABOLIC PANEL
ALT: 65 U/L — ABNORMAL HIGH (ref 0–53)
AST: 79 U/L — ABNORMAL HIGH (ref 0–37)
Alkaline Phosphatase: 45 U/L (ref 39–117)
CO2: 25 mEq/L (ref 19–32)
Chloride: 101 mEq/L (ref 96–112)
GFR calc non Af Amer: 80 mL/min — ABNORMAL LOW (ref >90)
Glucose, Bld: 116 mg/dL — ABNORMAL HIGH (ref 70–99)
Sodium: 135 mEq/L (ref 135–145)
Total Bilirubin: 0.9 mg/dL (ref 0.3–1.2)

## 2013-02-02 LAB — HEMOGLOBIN A1C
Hgb A1c MFr Bld: 5.5 % (ref <5.7)
Mean Plasma Glucose: 111 mg/dL (ref <117)

## 2013-02-02 LAB — LIPID PANEL
Cholesterol: 124 mg/dL (ref 0–200)
VLDL: 25 mg/dL (ref 0–40)

## 2013-02-02 LAB — PROTIME-INR: INR: 1.5 — ABNORMAL HIGH (ref 0.00–1.49)

## 2013-02-02 MED ORDER — WARFARIN SODIUM 7.5 MG PO TABS
7.5000 mg | ORAL_TABLET | Freq: Once | ORAL | Status: AC
  Administered 2013-02-02: 7.5 mg via ORAL
  Filled 2013-02-02: qty 1

## 2013-02-02 MED ORDER — ACETAMINOPHEN 325 MG PO TABS
325.0000 mg | ORAL_TABLET | Freq: Once | ORAL | Status: AC
  Administered 2013-02-02: 325 mg via ORAL

## 2013-02-02 MED ORDER — ACETAMINOPHEN 325 MG PO TABS
650.0000 mg | ORAL_TABLET | Freq: Four times a day (QID) | ORAL | Status: DC | PRN
  Administered 2013-02-02 – 2013-02-05 (×4): 650 mg via ORAL
  Filled 2013-02-02 (×4): qty 2

## 2013-02-02 MED ORDER — ACETAMINOPHEN 325 MG PO TABS
975.0000 mg | ORAL_TABLET | Freq: Once | ORAL | Status: AC
  Administered 2013-02-02: 975 mg via ORAL
  Filled 2013-02-02: qty 3

## 2013-02-02 NOTE — Progress Notes (Signed)
Pt being observed to have two episodes of rigor with temperature of 98.5, Tyelenol given,reassured and will continue to monitor.

## 2013-02-02 NOTE — Progress Notes (Signed)
Pt's temp read 103 this morning,PA Randall Reidler( on call) paged and notified,ordered tab tylenol 925mg  same given at 0624,pt put on oxygen at 2LNC,will continue to monitor,family at bedside. Jose Bush, Jose Bush

## 2013-02-02 NOTE — Progress Notes (Signed)
ANTICOAGULATION CONSULT NOTE - Follow-up  Pharmacy Consult for warfarin Indication: H/o DVT  No Known Allergies  Patient Measurements: Height: 5\' 8"  (172.7 cm) Weight: 211 lb 13.8 oz (96.1 kg) IBW/kg (Calculated) : 68.4  Vital Signs: Temp: 98.9 F (37.2 C) (05/05 1120) Temp src: Oral (05/05 1120) BP: 116/59 mmHg (05/05 1120) Pulse Rate: 80 (05/05 1120)  Labs:  Recent Labs  02/01/13 1734 02/02/13 0530  HGB 13.3 13.1  HCT 38.1* 37.2*  PLT 100* 71*  LABPROT 19.6* 17.7*  INR 1.72* 1.50*  CREATININE 0.94 0.99    Estimated Creatinine Clearance: 75.8 ml/min (by C-G formula based on Cr of 0.99).  Assessment: 72 yo male with h/o DVT (2010) presented with weakness and headache. He continues on coumadin but his INR is low at 1.5. No bleeding noted. Pt is anemic and thromboctyopenic. Of note, he was started on doxycycline which may increase the INR.   Goal of Therapy:  INR 2-3   Plan:  1. Repeat coumadin 7.5mg  PO x 1 tonight 2. F/u AM INR  Lysle Pearl, PharmD, BCPS Pager # [phone] 02/02/2013 11:49 AM

## 2013-02-02 NOTE — Progress Notes (Signed)
[facility] PROGRESS NOTE Assessment/Plan: Neutropenia/ Fever/Weakness: -  Spiking fevers over night. - Vanc zosyn 5.4.2014. Neutropenic. - blood cultu pending, CXR, U/A negative till date. - C3 and C4 complement along with ESR pending.   - feels better today  Lupus - cont plaquenil.   Thrombocytopenia - stable, follow up with rheumatologist.   DVT (deep venous thrombosis) - cont coumadin.  HYPERTENSION, UNSPECIFIED - stable cont home meds.   Code Status: full Family Communication: none  Disposition Plan: inpatient 3-4 days.   Consultants:  none  Procedures:  CXR  CT head  BC    Antibiotics:  Vanc and zosyn 5.4.2014  HPI/Subjective: No complains feels better today  Objective: Filed Vitals:   02/01/13 2130 02/01/13 2200 02/02/13 0026 02/02/13 0554  BP: 126/69 137/73 138/68 127/66  Pulse: 73 81 91 98  Temp:   98.3 F (36.8 C) 103 F (39.4 C)  TempSrc:   Oral Oral  Resp: 24 25 26 24   Height:   5\' 8"  (1.727 m)   Weight:   96.1 kg (211 lb 13.8 oz)   SpO2: 99% 99% 99% 97%    Intake/Output Summary (Last 24 hours) at 02/02/13 1022 Last data filed at 02/02/13 0659  Gross per 24 hour  Intake    170 ml  Output    320 ml  Net   -150 ml   Filed Weights   02/02/13 0026  Weight: 96.1 kg (211 lb 13.8 oz)    Exam:  General: Alert, awake, oriented x3, in no acute distress.slow in his answers.  HEENT: No bruits, no goiter.  Heart: Regular rate and rhythm, without murmurs, rubs, gallops.  Lungs: Good air movement, bilateral air movement.  Abdomen: Soft, nontender, nondistended, positive bowel sounds.  Neuro: Grossly intact, nonfocal.   Data Reviewed: Basic Metabolic Panel:  Recent Labs Lab 02/01/13 1734 02/02/13 0530  NA 134* 135  K 3.7 4.1  CL 100 101  CO2 22 25  GLUCOSE 138* 116*  BUN 21 20  CREATININE 0.94 0.99  CALCIUM 9.0 8.6   Liver Function Tests:  Recent Labs Lab 02/01/13 1734 02/02/13 0530  AST 61* 79*  ALT 53  65*  ALKPHOS 46 45  BILITOT 0.6 0.9  PROT 6.8 6.5  ALBUMIN 3.4* 3.3*   No results found for this basename: LIPASE, AMYLASE,  in the last 168 hours No results found for this basename: AMMONIA,  in the last 168 hours CBC:  Recent Labs Lab 02/01/13 1734 02/02/13 0530  WBC 4.1 2.4*  NEUTROABS 3.6 1.8  HGB 13.3 13.1  HCT 38.1* 37.2*  MCV 87.2 86.7  PLT 100* 71*   Cardiac Enzymes: No results found for this basename: CKTOTAL, CKMB, CKMBINDEX, TROPONINI,  in the last 168 hours BNP (last 3 results) No results found for this basename: PROBNP,  in the last 8760 hours CBG: No results found for this basename: GLUCAP,  in the last 168 hours  No results found for this or any previous visit (from the past 240 hour(s)).   Studies: Dg Chest 2 View  02/01/2013  *RADIOLOGY REPORT*  Clinical Data: Altered mental status.  CHEST - 2 VIEW  Comparison: 03/25/2012  Findings: Heart is normal size.  Stable mild chronic linear densities of the lungs, likely scarring.  No acute opacities or effusions.  No acute bony abnormality.  IMPRESSION: No acute cardiopulmonary disease.   Original Report Authenticated By: Charlett Nose, M.D.    Ct Head Wo Contrast  02/01/2013  *RADIOLOGY REPORT*  Clinical Data: Headache, dizziness  CT HEAD WITHOUT CONTRAST  Technique:  Contiguous axial images were obtained from the base of the skull through the vertex without contrast.  Comparison: Brain MRI 01/05/2010  Findings: No skull fracture is noted.  Paranasal sinuses mastoid air cells are unremarkable.  No intracranial hemorrhage, mass effect or midline shift.  No acute infarction.  No mass lesion is noted on this unenhanced scan.  Moderate cerebral atrophy. Small lacunar infarct left parietal lobe posteriorly is stable.  Stable periventricular and patchy subcortical chronic white matter disease.  IMPRESSION: No acute intracranial abnormality.  Stable atrophy and chronic white matter disease.   Original Report Authenticated By: Natasha Mead, M.D.     Scheduled Meds: . doxycycline  100 mg Oral Q12H  . hydroxychloroquine  200 mg Oral BID  . lisinopril  10 mg Oral Daily  . omega-3 acid ethyl esters  1 g Oral Daily  . pantoprazole  40 mg Oral Daily  . Warfarin - Pharmacist Dosing Inpatient   Does not apply q1800   Continuous Infusions: . sodium chloride 75 mL/hr at 02/02/13 0008     [name]  [facility] Pager [phone]. If 8PM-8AM, please contact night-coverage at [email or web address], password [facility] 02/02/2013, 10:22 AM  LOS: 1 day

## 2013-02-03 ENCOUNTER — Inpatient Hospital Stay (HOSPITAL_COMMUNITY): Payer: Medicare Other

## 2013-02-03 ENCOUNTER — Encounter (HOSPITAL_COMMUNITY): Payer: Self-pay | Admitting: Radiology

## 2013-02-03 DIAGNOSIS — I82409 Acute embolism and thrombosis of unspecified deep veins of unspecified lower extremity: Secondary | ICD-10-CM

## 2013-02-03 LAB — GLUCOSE, CAPILLARY
Glucose-Capillary: 121 mg/dL — ABNORMAL HIGH (ref 70–99)
Glucose-Capillary: 89 mg/dL (ref 70–99)

## 2013-02-03 LAB — PROTIME-INR
INR: 1.29 (ref 0.00–1.49)
Prothrombin Time: 15.8 seconds — ABNORMAL HIGH (ref 11.6–15.2)

## 2013-02-03 MED ORDER — IOHEXOL 350 MG/ML SOLN
100.0000 mL | Freq: Once | INTRAVENOUS | Status: AC | PRN
  Administered 2013-02-03: 100 mL via INTRAVENOUS

## 2013-02-03 MED ORDER — ENOXAPARIN SODIUM 100 MG/ML ~~LOC~~ SOLN
100.0000 mg | Freq: Two times a day (BID) | SUBCUTANEOUS | Status: DC
  Administered 2013-02-03: 20 mg via SUBCUTANEOUS
  Filled 2013-02-03 (×3): qty 1

## 2013-02-03 MED ORDER — WARFARIN SODIUM 10 MG PO TABS
10.0000 mg | ORAL_TABLET | Freq: Once | ORAL | Status: AC
  Administered 2013-02-03: 10 mg via ORAL
  Filled 2013-02-03: qty 1

## 2013-02-03 MED ORDER — ENOXAPARIN SODIUM 40 MG/0.4ML ~~LOC~~ SOLN
40.0000 mg | SUBCUTANEOUS | Status: DC
  Administered 2013-02-03: 40 mg via SUBCUTANEOUS
  Filled 2013-02-03: qty 0.4

## 2013-02-03 MED ORDER — ENOXAPARIN SODIUM 80 MG/0.8ML ~~LOC~~ SOLN: 80.0000 mg | Freq: Once | SUBCUTANEOUS | Status: DC

## 2013-02-03 MED ORDER — ENOXAPARIN SODIUM 80 MG/0.8ML ~~LOC~~ SOLN
80.0000 mg | Freq: Once | SUBCUTANEOUS | Status: AC
  Administered 2013-02-03: 80 mg via SUBCUTANEOUS
  Filled 2013-02-03: qty 0.8

## 2013-02-03 NOTE — Progress Notes (Signed)
[facility] PROGRESS NOTE Assessment/Plan: Neutropenia/ Fever/Weakness: -  Spiking fevers over night. - Vanc zosyn 5.4.2014. Neutropenic. - blood cultu pending, CXR no infiltrate, U/A negative till date. - C3 and C4 complement along with ESR 7 -  Unclear etiology sub therapeutic INR on admission rule out DVT, PE. - serum quatifieron, HIV.  Lupus - cont plaquenil.   Thrombocytopenia - stable, follow up with rheumatologist.   DVT (deep venous thrombosis) - cont coumadin.  HYPERTENSION, UNSPECIFIED - stable cont home meds.   Code Status: full Family Communication: none  Disposition Plan: inpatient 3-4 days.   Consultants:  none  Procedures:  CXR  CT head  BC    Antibiotics:  Vanc and zosyn 5.4.2014  HPI/Subjective: No complains feels better today  Objective: Filed Vitals:   02/02/13 1840 02/02/13 2200 02/03/13 0200 02/03/13 0600  BP: 138/65 159/85 120/59 122/74  Pulse: 101 62 71 88  Temp: 101 F (38.3 C) 100.1 F (37.8 C) 98.7 F (37.1 C) 99.7 F (37.6 C)  TempSrc: Oral Oral Oral Oral  Resp: 20 20 20 20   Height:      Weight:      SpO2: 97% 100% 98% 100%    Intake/Output Summary (Last 24 hours) at 02/03/13 1016 Last data filed at 02/03/13 0845  Gross per 24 hour  Intake   3170 ml  Output    301 ml  Net   2869 ml   Filed Weights   02/02/13 0026  Weight: 96.1 kg (211 lb 13.8 oz)    Exam:  General: Alert, awake, oriented x3, in no acute distress.slow in his answers.  HEENT: No bruits, no goiter.  Heart: Regular rate and rhythm, without murmurs, rubs, gallops.  Lungs: Good air movement, bilateral air movement.  Abdomen: Soft, nontender, nondistended, positive bowel sounds.  Neuro: Grossly intact, nonfocal.   Data Reviewed: Basic Metabolic Panel:  Recent Labs Lab 02/01/13 1734 02/02/13 0530  NA 134* 135  K 3.7 4.1  CL 100 101  CO2 22 25  GLUCOSE 138* 116*  BUN 21 20  CREATININE 0.94 0.99  CALCIUM 9.0 8.6   Liver  Function Tests:  Recent Labs Lab 02/01/13 1734 02/02/13 0530  AST 61* 79*  ALT 53 65*  ALKPHOS 46 45  BILITOT 0.6 0.9  PROT 6.8 6.5  ALBUMIN 3.4* 3.3*   No results found for this basename: LIPASE, AMYLASE,  in the last 168 hours No results found for this basename: AMMONIA,  in the last 168 hours CBC:  Recent Labs Lab 02/01/13 1734 02/02/13 0530  WBC 4.1 2.4*  NEUTROABS 3.6 1.8  HGB 13.3 13.1  HCT 38.1* 37.2*  MCV 87.2 86.7  PLT 100* 71*   Cardiac Enzymes: No results found for this basename: CKTOTAL, CKMB, CKMBINDEX, TROPONINI,  in the last 168 hours BNP (last 3 results) No results found for this basename: PROBNP,  in the last 8760 hours CBG:  Recent Labs Lab 02/02/13 2116 02/03/13 0640  GLUCAP 121* 89    Recent Results (from the past 240 hour(s))  CULTURE, BLOOD (ROUTINE X 2)     Status: None   Collection Time    02/01/13 10:10 PM      Result Value Range Status   Specimen Description BLOOD RIGHT ARM   Final   Special Requests BOTTLES DRAWN AEROBIC AND ANAEROBIC 10CC EACH   Final   Culture  Setup Time 02/02/2013 02:05   Final   Culture     Final   Value:  BLOOD CULTURE RECEIVED NO GROWTH TO DATE CULTURE WILL BE HELD FOR 5 DAYS BEFORE ISSUING A FINAL NEGATIVE REPORT   Report Status PENDING   Incomplete  CULTURE, BLOOD (ROUTINE X 2)     Status: None   Collection Time    02/01/13 10:20 PM      Result Value Range Status   Specimen Description BLOOD RIGHT HAND   Final   Special Requests BOTTLES DRAWN AEROBIC ONLY 10CC   Final   Culture  Setup Time 02/02/2013 02:05   Final   Culture     Final   Value:        BLOOD CULTURE RECEIVED NO GROWTH TO DATE CULTURE WILL BE HELD FOR 5 DAYS BEFORE ISSUING A FINAL NEGATIVE REPORT   Report Status PENDING   Incomplete     Studies: Dg Chest 2 View  02/01/2013  *RADIOLOGY REPORT*  Clinical Data: Altered mental status.  CHEST - 2 VIEW  Comparison: 03/25/2012  Findings: Heart is normal size.  Stable mild chronic linear  densities of the lungs, likely scarring.  No acute opacities or effusions.  No acute bony abnormality.  IMPRESSION: No acute cardiopulmonary disease.   Original Report Authenticated By: Charlett Nose, M.D.    Ct Head Wo Contrast  02/01/2013  *RADIOLOGY REPORT*  Clinical Data: Headache, dizziness  CT HEAD WITHOUT CONTRAST  Technique:  Contiguous axial images were obtained from the base of the skull through the vertex without contrast.  Comparison: Brain MRI 01/05/2010  Findings: No skull fracture is noted.  Paranasal sinuses mastoid air cells are unremarkable.  No intracranial hemorrhage, mass effect or midline shift.  No acute infarction.  No mass lesion is noted on this unenhanced scan.  Moderate cerebral atrophy. Small lacunar infarct left parietal lobe posteriorly is stable.  Stable periventricular and patchy subcortical chronic white matter disease.  IMPRESSION: No acute intracranial abnormality.  Stable atrophy and chronic white matter disease.   Original Report Authenticated By: Natasha Mead, M.D.    Mri Brain Without Contrast  02/02/2013  *RADIOLOGY REPORT*  Clinical Data: Episode of confusion and delayed response.  Stroke.  MRI HEAD WITHOUT CONTRAST  Technique:  Multiplanar, multiecho pulse sequences of the brain and surrounding structures were obtained according to standard protocol without intravenous contrast.  Comparison: CT head without contrast 02/01/2013.  Findings: No acute infarct, hemorrhage, mass lesion is present. The atrophy and white matter disease is advanced for age.  The ventricles are proportionate to the degree of atrophy.  No hemorrhage or mass lesion is evident.  Flow is present in the major intracranial arteries.  The globes and orbits are intact.  Fluid levels present in the left maxillary sinus.  Mucosal thickening is asymmetric in the left anterior ethmoid air cells and frontal sinus.  The sphenoid sinuses are clear.  There is some fluid in the right mastoid air cells.  The visualized  salivary glands are within normal limits.  IMPRESSION:  1.  Atrophy and advanced white matter disease bilaterally.  This is nonspecific, but likely reflects the sequelae of chronic microvascular ischemia. 2.  No acute intracranial abnormality. 3.  Left anterior paranasal sinus disease with a fluid level on the left maxillary sinus suggesting acute sinusitis. 4.  Right mastoid effusion.  No obstructing nasopharyngeal lesion is evident.   Original Report Authenticated By: Marin Roberts, M.D.     Scheduled Meds: . doxycycline  100 mg Oral Q12H  . hydroxychloroquine  200 mg Oral BID  . lisinopril  10 mg  Oral Daily  . omega-3 acid ethyl esters  1 g Oral Daily  . pantoprazole  40 mg Oral Daily  . warfarin  10 mg Oral Once  . Warfarin - Pharmacist Dosing Inpatient   Does not apply q1800   Continuous Infusions: . sodium chloride 75 mL/hr at 02/02/13 1807     [name]  [facility] Pager [phone]. If 8PM-8AM, please contact night-coverage at [email or web address], password [facility] 02/03/2013, 10:16 AM  LOS: 2 days

## 2013-02-03 NOTE — Progress Notes (Signed)
*  Preliminary Results* Bilateral lower extremity venous duplex completed. There is evidence of chronic vs subacute deep vein thrombosis involving the left popliteal and left posterior veins. There is no evidence of acute deep vein thrombosis involving bilateral lower extremities.  02/03/2013 1:34 PM Gertie Fey, RDMS, RDCS

## 2013-02-03 NOTE — Progress Notes (Addendum)
ANTICOAGULATION CONSULT NOTE - Follow-up  Pharmacy Consult for warfarin Indication: H/o DVT  No Known Allergies  Patient Measurements: Height: 5\' 8"  (172.7 cm) Weight: 211 lb 13.8 oz (96.1 kg) IBW/kg (Calculated) : 68.4  Vital Signs: Temp: 99.7 F (37.6 C) (05/06 0600) Temp src: Oral (05/06 0600) BP: 122/74 mmHg (05/06 0600) Pulse Rate: 88 (05/06 0600)  Labs:  Recent Labs  02/01/13 1734 02/02/13 0530 02/03/13 0540  HGB 13.3 13.1  --   HCT 38.1* 37.2*  --   PLT 100* 71*  --   LABPROT 19.6* 17.7* 15.8*  INR 1.72* 1.50* 1.29  CREATININE 0.94 0.99  --     Estimated Creatinine Clearance: 75.8 ml/min (by C-G formula based on Cr of 0.99).  Assessment: 73 yo male with h/o DVT (2010) presented with weakness and headache. He continues on coumadin but his INR is dropping and is now 1.29. No bleeding noted. Pt is anemic and thromboctyopenic as of 5/5. Unclear why pts INR is dropping. He is been received a higher dose of coumadin than he normally takes and he is on doxycycline which can increase the INR.   Goal of Therapy:  INR 2-3   Plan:  1. Coumadin 10mg  PO x 1 - give at noon to better assess effect on INR 2. F/u AM INR 3. Consider bridging but pt is thrombocytopenic (plts 71) as of 5/5  Lysle Pearl, PharmD, BCPS Pager # [phone] 02/03/2013 10:16 AM  Addendum: Adding lovenox for VTE prophylaxis. Will need to watch closely due to thrombocytopenia.   Plan: 1. Lovenox 40mg  SQ Q24H 2. CBC Q72H while on lovenox  Lysle Pearl, PharmD, BCPS Pager # [phone] 02/03/2013 11:06 AM  Addendum:  Bilateral LE venous doppler showed chronic vs subacute DVT involving the left popliteal and left posterior veins. Pharmacy is consulted to change lovenox from VTE prophylactic dose to treatment dose. Patient received lovenox 40mg  sq at 1200, and 10mg  coumadin at 1200. Scr 0.99, est. Crcl ~ 75 ml/min  Plan:  - Lovenox 100mg  sq q 12hrs first dose 2200 tonight - F/u AM INR and CBC,  watch plt closely.

## 2013-02-04 DIAGNOSIS — D61811 Other drug-induced pancytopenia: Secondary | ICD-10-CM

## 2013-02-04 DIAGNOSIS — D61818 Other pancytopenia: Secondary | ICD-10-CM

## 2013-02-04 LAB — CBC
HCT: 34.3 % — ABNORMAL LOW (ref 39.0–52.0)
Hemoglobin: 11.9 g/dL — ABNORMAL LOW (ref 13.0–17.0)
MCV: 86.4 fL (ref 78.0–100.0)
RBC: 3.97 MIL/uL — ABNORMAL LOW (ref 4.22–5.81)
WBC: 1.9 10*3/uL — ABNORMAL LOW (ref 4.0–10.5)

## 2013-02-04 LAB — SAVE SMEAR

## 2013-02-04 LAB — QUANTIFERON TB GOLD ASSAY (BLOOD)
Interferon Gamma Release Assay: NEGATIVE
Mitogen value: 8.71 IU/mL
Quantiferon Nil Value: 2.23 IU/mL
TB Ag value: 2.32 IU/mL
TB Antigen Minus Nil Value: 0.09 IU/mL

## 2013-02-04 LAB — GLUCOSE, CAPILLARY

## 2013-02-04 LAB — PROTIME-INR
INR: 1.58 — ABNORMAL HIGH (ref 0.00–1.49)
Prothrombin Time: 18.4 s — ABNORMAL HIGH (ref 11.6–15.2)

## 2013-02-04 MED ORDER — WARFARIN SODIUM 10 MG PO TABS
10.0000 mg | ORAL_TABLET | Freq: Once | ORAL | Status: AC
  Administered 2013-02-04: 10 mg via ORAL
  Filled 2013-02-04: qty 1

## 2013-02-04 MED ORDER — LEVOFLOXACIN IN D5W 750 MG/150ML IV SOLN
750.0000 mg | INTRAVENOUS | Status: DC
  Administered 2013-02-04 – 2013-02-05 (×2): 750 mg via INTRAVENOUS
  Filled 2013-02-04 (×3): qty 150

## 2013-02-04 MED ORDER — VANCOMYCIN HCL 10 G IV SOLR
1250.0000 mg | Freq: Two times a day (BID) | INTRAVENOUS | Status: DC
  Filled 2013-02-04 (×2): qty 1250

## 2013-02-04 NOTE — Progress Notes (Signed)
Patient evaluated for long-term disease management services with [facility] as a benefit of his [employer]. Spoke with patient and wife at bedside to explain services. However, both do not feel patient needs [facility] Care Management at current time. Left brochure with contact information for them to call in future if they should change their mind.  Raiford Noble, MSN-Ed, RN,BSN, [facility], [phone]

## 2013-02-04 NOTE — Progress Notes (Signed)
[facility] PROGRESS NOTE  Jose Bush OZH:[id number] DOB: 1940-12-02 DOA: 02/01/2013 PCP: Selinda Flavin, MD And Assessment/Plan: #1. Fever/neutropenic fever Questionable etiology. Patient with decreasing cell lines. Patient still with fevers. Urinalysis was negative. Blood cultures with no growth to date. CT of the chest was negative for any acute infiltrate. Will repeat UA with cultures and sensitivities. Will check blood cultures x2. Will discontinue doxycycline secondary to pancytopenia. Will start patient on IV Levaquin empirically. Follow. If patient continues to spike fevers without any known source May need to consult with ID for further evaluation and management.  #2 pancytopenia Patient does have a pancytopenia with his counts have been dropping since admission. Patient's white count is at 1.9 today. Hemoglobin is 11.9. And platelets at 47. Will discontinue full dose Lovenox and Coumadin for now. LDH is elevated at 464 and haptoglobin is decreased at 41.? Hemolysis. Peripheral smear is pending. Will discontinue doxycycline as this may lead to hemolytic anemia, neutropenia, thrombocytopenia. Will discontinue plaquenil for now. We'll consult with hematology for further evaluation and management. If no significant improvement in his counts in the next couple a days may consider bone marrow biopsy per interventional radiology.  #3 history of lupus Will hold plaquenil for now secondary to pancytopenia. Once counts have improved may resume.  #4 history of DVT Patient with prior history of DVT and also history of lupus was on Coumadin. However patient's platelet count is at 47 today. Will discontinue Coumadin. Will discontinue full dose Lovenox. We'll eyes I are to place an IVC filter for now. Follow.  #5 hypertension Stable. Continue current regimen.   Code Status: Full. Family Communication: Updated patient and wife at bedside. Disposition Plan: Home when medically  stable.   Consultants:  Hematology: Dr. Arlis Porta  02/04/2013  Procedures:  CT anterior chest 02/03/2013  CT head 02/01/2013  MRI of the head 02/02/2013  Lower extremity Dopplers 02/03/2013  Chest x-ray 02/01/2013  Antibiotics:  Doxycycline 02/01/2013 2 02/04/2013  HPI/Subjective: No complaints. Patient denies any bleeding. Patient states last night he had urinary frequency.  Objective: Filed Vitals:   02/04/13 0150 02/04/13 0502 02/04/13 0955 02/04/13 1335  BP: 105/62 135/71 127/61 113/64  Pulse: 70 85 89 79  Temp: 99.4 F (37.4 C) 98.6 F (37 C) 99 F (37.2 C) 100.6 F (38.1 C)  TempSrc: Oral Oral Oral Oral  Resp: 20 22 20 20   Height:      Weight:      SpO2: 99% 98% 98% 100%    Intake/Output Summary (Last 24 hours) at 02/04/13 1731 Last data filed at 02/04/13 1300  Gross per 24 hour  Intake    600 ml  Output      0 ml  Net    600 ml   Filed Weights   02/02/13 0026  Weight: 96.1 kg (211 lb 13.8 oz)    Exam:   General:  NAD  Cardiovascular: RRR  Respiratory: CTAB  Abdomen: Soft, nontender, nondistended, positive bowel sounds.  Extremities: No clubbing cyanosis or edema.  Data Reviewed: Basic Metabolic Panel:  Recent Labs Lab 02/01/13 1734 02/02/13 0530  NA 134* 135  K 3.7 4.1  CL 100 101  CO2 22 25  GLUCOSE 138* 116*  BUN 21 20  CREATININE 0.94 0.99  CALCIUM 9.0 8.6   Liver Function Tests:  Recent Labs Lab 02/01/13 1734 02/02/13 0530  AST 61* 79*  ALT 53 65*  ALKPHOS 46 45  BILITOT 0.6 0.9  PROT 6.8 6.5  ALBUMIN  3.4* 3.3*   No results found for this basename: LIPASE, AMYLASE,  in the last 168 hours No results found for this basename: AMMONIA,  in the last 168 hours CBC:  Recent Labs Lab 02/01/13 1734 02/02/13 0530 02/04/13 0500  WBC 4.1 2.4* 1.9*  NEUTROABS 3.6 1.8  --   HGB 13.3 13.1 11.9*  HCT 38.1* 37.2* 34.3*  MCV 87.2 86.7 86.4  PLT 100* 71* 47*   Cardiac Enzymes: No results found for this  basename: CKTOTAL, CKMB, CKMBINDEX, TROPONINI,  in the last 168 hours BNP (last 3 results) No results found for this basename: PROBNP,  in the last 8760 hours CBG:  Recent Labs Lab 02/03/13 1627 02/03/13 2100 02/04/13 0637 02/04/13 1142 02/04/13 1648  GLUCAP 119* 127* 96 118* 133*    Recent Results (from the past 240 hour(s))  CULTURE, BLOOD (ROUTINE X 2)     Status: None   Collection Time    02/01/13 10:10 PM      Result Value Range Status   Specimen Description BLOOD RIGHT ARM   Final   Special Requests BOTTLES DRAWN AEROBIC AND ANAEROBIC 10CC EACH   Final   Culture  Setup Time 02/02/2013 02:05   Final   Culture     Final   Value:        BLOOD CULTURE RECEIVED NO GROWTH TO DATE CULTURE WILL BE HELD FOR 5 DAYS BEFORE ISSUING A FINAL NEGATIVE REPORT   Report Status PENDING   Incomplete  CULTURE, BLOOD (ROUTINE X 2)     Status: None   Collection Time    02/01/13 10:20 PM      Result Value Range Status   Specimen Description BLOOD RIGHT HAND   Final   Special Requests BOTTLES DRAWN AEROBIC ONLY 10CC   Final   Culture  Setup Time 02/02/2013 02:05   Final   Culture     Final   Value:        BLOOD CULTURE RECEIVED NO GROWTH TO DATE CULTURE WILL BE HELD FOR 5 DAYS BEFORE ISSUING A FINAL NEGATIVE REPORT   Report Status PENDING   Incomplete     Studies: Ct Angio Chest Pe W/cm &/or Wo Cm  02/03/2013  *RADIOLOGY REPORT*  Clinical Data: Fever, tachycardia  CT ANGIOGRAPHY CHEST  Technique:  Multidetector CT imaging of the chest using the standard protocol during bolus administration of intravenous contrast. Multiplanar reconstructed images including MIPs were obtained and reviewed to evaluate the vascular anatomy.  Contrast: OMNIPAQUE IOHEXOL 350 MG/ML SOLN  Comparison: Chest x-ray obtained 02/01/2013; chest CTs 03/09/2012  Findings:  Mediastinum: Unremarkable CT appearance of the thyroid gland.  No suspicious mediastinal or hilar adenopathy.  No soft tissue mediastinal mass.  The  thoracic esophagus is unremarkable. Nonspecific 9 mm para esophageal lymph node just above the GE junction remains stable dating back to January 2011 and is therefore likely reactive/benign.  Heart/Vascular: Adequate opacification of the pulmonary arteries to the proximal subsegmental level.  No central filling defect to suggest acute pulmonary embolus.  Conventional three-vessel arch anatomy.  No aneurysmal dilatation.  Normal-caliber central and main pulmonary arteries.  Scattered atherosclerotic calcifications in the transverse aorta and also within the coronary arteries. Mild cardiomegaly.  Lungs/Pleura: Mild to moderate combined paraseptal and centrilobular emphysema predominately affecting the upper lungs with some focal bullous change versus pulmonary cyst formation in the medial posterior right lower lobe.  Linear atelectasis versus scarring noted in the bilateral upper and lower lobes.  Mild lower  lobe and central bronchial wall thickening.  Upper Abdomen: Stable 2.6 cm hypoattenuating lesion in the medial dome of the liver dating back to at least January 2011.  Bones: No acute fracture or aggressive appearing lytic or blastic osseous lesion.  IMPRESSION:  1.  Negative for pulmonary embolus, pneumonia or other acute cardiopulmonary process. 2.  Stable areas of chronic atelectasis versus scarring in the bilateral upper and lower lobes 3.  Mild - moderate emphysema and chronic bronchial wall thickening consistent with COPD 4.  Atherosclerosis including coronary artery disease 5.  Mild cardiomegaly   Original Report Authenticated By: Malachy Moan, M.D.     Scheduled Meds: . doxycycline  100 mg Oral Q12H  . hydroxychloroquine  200 mg Oral BID  . levofloxacin (LEVAQUIN) IV  750 mg Intravenous Q24H  . lisinopril  10 mg Oral Daily  . omega-3 acid ethyl esters  1 g Oral Daily  . pantoprazole  40 mg Oral Daily   Continuous Infusions: . sodium chloride 75 mL/hr at 02/03/13 1045    Principal  Problem:   Weakness Active Problems:   HYPERTENSION, UNSPECIFIED   DVT (deep venous thrombosis)   Rigors   Headache   Lupus   Thrombocytopenia   Fever   Neutropenia   Other pancytopenia    Time spent: > 35 mins    [facility]  [facility] Pager [phone]. If 7PM-7AM, please contact night-coverage at [email or web address], password [facility] 02/04/2013, 5:31 PM  LOS: 3 days

## 2013-02-04 NOTE — Progress Notes (Addendum)
ANTIBIOTIC CONSULT NOTE - INITIAL  Pharmacy Consult for Vancomycin Indication: Neutropenic Fever  No Known Allergies  Patient Measurements: Height: 5\' 8"  (172.7 cm) Weight: 211 lb 13.8 oz (96.1 kg) IBW/kg (Calculated) : 68.4   Vital Signs: Temp: 100.6 F (38.1 C) (05/07 1335) Temp src: Oral (05/07 1335) BP: 113/64 mmHg (05/07 1335) Pulse Rate: 79 (05/07 1335)    Recent Labs  02/02/13 0530 02/04/13 0500  WBC 2.4* 1.9*  HGB 13.1 11.9*  PLT 71* 47*  CREATININE 0.99  --    Estimated Creatinine Clearance: 75.8 ml/min (by C-G formula based on Cr of 0.99).   Medical History: Past Medical History  Diagnosis Date  . HTN (hypertension)   . Chest pain, unspecified   . DVT (deep venous thrombosis)   . Lupus     Assessment: 71yoM being started on Vancomycin for neutropenic fever. WBC 1.9, Tmax 101.1'F, Scr 0.99, Wt 96.1kg.  Antibiotics: Doxycycline (PO)  5/5 >> Vancomycin 5/7 >> Levaquin 5/7 >>  Cultures: Blood x2 (5/4) - pending  Blood x 2 (5/7) - sent  Goal of Therapy:  Vancomycin trough 15-20 mcg/ml Clinical improvement  Plan:  1) Vancomycin 1250mg  IV Q12 hours 2) Monitor renal function, clinical status, cultures, LOT, vancomycin trough at steady state   Benjaman Pott, PharmD, BCPS 02/04/2013   5:52 PM

## 2013-02-04 NOTE — Progress Notes (Signed)
Patient has been asking all day to speak with his doctor about his progress.  Dr. Has not seen patient according to patient.  Paged Dr. Janee Morn to let him know patient is requesting to speak with him.  Lance Bosch, RN

## 2013-02-04 NOTE — Consult Note (Signed)
Jose Bush  [phone]   ONCOLOGY  HOSPITAL CONSULTATION NOTE  Jose Bush                                MR#: [id number]  DOB: 30-Aug-1941                       CSN#: [id number]  Referring MD: [facility]  Teaching Service  Dr.   Valera Castle MD: Julieanne Manson Rheumatology: Dr. Kellie Simmering  Reason for Consult: Thrombocytopenia   NWG:NFAOZHY Jose Bush Jose Bush is a 72 y.o. white male  With multiple medical issues listed below, including a recent history of LLE  DVT in 2010 requiring coumadin 5 mg daily since, managed by his PCP, Lupus on Plaquenil 200 mg bid (and steroids until 4 weeks ago) asked to see for evaluation of thrombocytopenia. In review, he was admitted on 02/01/2013 with generalized weakness and severe frontal headache complicated with confusion,fever of 101 F and rigors 2 nights prior to admission. CT of the head without contrast  And MRI brain w/o contrast were negative for acute disease. CXR was negative for active disease.EKG was wnl. Labs taken at the ED were remarkable for thrombocytopenia, with platelet count of 100k. WBC was 4.1 with ANC 3.6, normal mCV 87.2, H/H 13.3/38.1. Bili was normal at 0.6. INR was 1.72 (subtherapeutic). Coumadin 2.5 mg was added   After cultures drawn, he was initiated on Doxycycline, until RMSF returns negative.   Vanco IV is given  as well.    Bilateral LE venous doppler showed chronic vs subacute DVT involving the left popliteal and left posterior veins.  CT angio chest on 5/6 was negative for PE. NO masses. Pharmacy changed Lovenox from VTE prophylactic dose to treatment dose( Patient had  received lovenox 40mg  sq at 1200, and 10mg  coumadin at 1200 prior to the findings) , while INR was still subtherapeutic at 1.29. On 5/5 his CBC values dropped as follows: WBC 2.4, ANC 1.8, H/H 13.1/37.2 and platelets 71k.ESR was 7. TB titer was negative.  Today, his counts continued to drop. WBC 1.9, H/H 11.9/34.3, platelets 47. INR  improved to 1.58. HIV negative.  serum quantiferon pending. DIC and HIT are pending. Haptoglobin is 41.LDH is 464. In looking at data available through 2010, platelets were in the range of 150-164. No other information is available for review at this time.    Smear has been ordered for review.   No family history of hematological disorders. No gum bleed. No epistaxis or hemoptysis. Denies easy bruising. He takes a baby ASA daily on chronic basis. Patient states that has never had a hematological evaluation prior to this admission. Never had a bone marrow biopsy. Patient states that labs drawn as OP were esentially normal.   No blood in urine or in stool.  We were kindly asked to see the patient with recommendations  PMH:  Past Medical History  Diagnosis Date  . HTN (hypertension)   . Chest pain, unspecified   . DVT (deep venous thrombosis)   . Lupus 2010       PUD  03/2010  Surgeries:  Past Surgical History  Procedure Laterality Date  . Hernia repair R 2011    R partial meniscectomy 06/2007  Allergies: No Known Allergies  Medications:   . doxycycline  100 mg Oral Q12H  . hydroxychloroquine  200 mg Oral BID  . lisinopril  10  mg Oral Daily  . omega-3 acid ethyl esters  1 g Oral Daily  . pantoprazole  40 mg Oral Daily  . warfarin  10 mg Oral Once  . Warfarin - Pharmacist Dosing Inpatient   Does not apply q1800   ZOX:[id number], nitroGLYCERIN  ROS: See HPI for significant positives,rest of ROS negative.  Family History:    Family History  Problem Relation Age of Onset  . Hypertension Other   . Stroke Mother     No family history of bleeding disorders.  Social History:  reports that he quit smoking about 34 years ago. His smoking use included Cigarettes. He smoked 0.00 packs per day. He has never used smokeless tobacco. He reports that he does not drink alcohol or use illicit drugs. Married, 2 children, retired Scientist, water quality. Lives in [place],  [place]  Physical Exam    Filed Vitals:   02/04/13 1335  BP: 113/64  Pulse: 79  Temp: 100.6 F (38.1 C)  Resp: 20     Filed Weights   02/02/13 0026  Weight: 211 lb 13.8 oz (96.1 kg)    General:  43 -year-old  in no acute distress A. and O. x3  well-developed and ill-appearing HEENT: Normocephalic, atraumatic, PERRLA. Oral cavity without thrush or lesions. No gingival bleeding. Neck supple. no thyromegaly, no cervical or supraclavicular adenopathy  Lungs clear bilaterally . No wheezing, rhonchi or rales. No axillary masses. Breasts: not examined. Cardiac regular rate and rhythm,no murmur , rubs or gallops Abdomen obese, soft nontender , bowel sounds x4. No HSM. No masses palpable.  GU/rectal: deferred. Extremities no clubbing cyanosis or edema. No bruising or petechial rash. No cord.  Musculoskeletal: no spinal tenderness.  Neuro: Non Focal  Labs:     Recent Labs Lab 02/01/13 1734 02/02/13 0530 02/04/13 0500  WBC 4.1 2.4* 1.9*  HGB 13.3 13.1 11.9*  HCT 38.1* 37.2* 34.3*  PLT 100* 71* 47*  MCV 87.2 86.7 86.4  MCH 30.4 30.5 30.0  MCHC 34.9 35.2 34.7  RDW 15.1 15.1 15.5  LYMPHSABS 0.3* 0.4*  --   MONOABS 0.2 0.2  --   EOSABS 0.0 0.0  --   BASOSABS 0.0 0.0  --          Recent Labs Lab 02/01/13 1734 02/02/13 0530  NA 134* 135  K 3.7 4.1  CL 100 101  CO2 22 25  GLUCOSE 138* 116*  BUN 21 20  CREATININE 0.94 0.99  CALCIUM 9.0 8.6  AST 61* 79*  ALT 53 65*  ALKPHOS 46 45  BILITOT 0.6 0.9        Component Value Date/Time   BILITOT 0.9 02/02/2013 0530      Recent Labs Lab 02/01/13 1734 02/02/13 0530 02/03/13 0540 02/04/13 0500  INR 1.72* 1.50* 1.29 1.58*    No results found for this basename: DDIMER,  in the last 72 hours   Anemia panel:  No results found for this basename: VITAMINB12, FOLATE, FERRITIN, TIBC, IRON, RETICCTPCT,  in the last 72 hours  Urinalysis    Component Value Date/Time   COLORURINE YELLOW 02/01/2013 1900   APPEARANCEUR  CLEAR 02/01/2013 1900   LABSPEC 1.019 02/01/2013 1900   PHURINE 6.0 02/01/2013 1900   GLUCOSEU NEGATIVE 02/01/2013 1900   HGBUR NEGATIVE 02/01/2013 1900   BILIRUBINUR NEGATIVE 02/01/2013 1900   KETONESUR NEGATIVE 02/01/2013 1900   PROTEINUR NEGATIVE 02/01/2013 1900   UROBILINOGEN 0.2 02/01/2013 1900   NITRITE NEGATIVE 02/01/2013 1900   LEUKOCYTESUR NEGATIVE 02/01/2013  1900    Drugs of Abuse     Component Value Date/Time   LABOPIA NONE DETECTED 02/01/2013 2334   COCAINSCRNUR NONE DETECTED 02/01/2013 2334   LABBENZ NONE DETECTED 02/01/2013 2334   AMPHETMU NONE DETECTED 02/01/2013 2334   THCU NONE DETECTED 02/01/2013 2334   LABBARB NONE DETECTED 02/01/2013 2334     Imaging Studies:  Dg Chest 2 View  02/01/2013  *RADIOLOGY REPORT*  Clinical Data: Altered mental status.  CHEST - 2 VIEW  Comparison: 03/25/2012  Findings: Heart is normal size.  Stable mild chronic linear densities of the lungs, likely scarring.  No acute opacities or effusions.  No acute bony abnormality.  IMPRESSION: No acute cardiopulmonary disease.   Original Report Authenticated By: Charlett Nose, M.D.    Ct Head Wo Contrast  02/01/2013  *RADIOLOGY REPORT*  Clinical Data: Headache, dizziness  CT HEAD WITHOUT CONTRAST  Technique:  Contiguous axial images were obtained from the base of the skull through the vertex without contrast.  Comparison: Brain MRI 01/05/2010  Findings: No skull fracture is noted.  Paranasal sinuses mastoid air cells are unremarkable.  No intracranial hemorrhage, mass effect or midline shift.  No acute infarction.  No mass lesion is noted on this unenhanced scan.  Moderate cerebral atrophy. Small lacunar infarct left parietal lobe posteriorly is stable.  Stable periventricular and patchy subcortical chronic white matter disease.  IMPRESSION: No acute intracranial abnormality.  Stable atrophy and chronic white matter disease.   Original Report Authenticated By: Natasha Mead, M.D.    Ct Angio Chest Pe W/cm &/or Wo Cm  02/03/2013  *RADIOLOGY  REPORT*  Clinical Data: Fever, tachycardia  CT ANGIOGRAPHY CHEST  Technique:  Multidetector CT imaging of the chest using the standard protocol during bolus administration of intravenous contrast. Multiplanar reconstructed images including MIPs were obtained and reviewed to evaluate the vascular anatomy.  Contrast: OMNIPAQUE IOHEXOL 350 MG/ML SOLN  Comparison: Chest x-ray obtained 02/01/2013; chest CTs 03/09/2012  Findings:  Mediastinum: Unremarkable CT appearance of the thyroid gland.  No suspicious mediastinal or hilar adenopathy.  No soft tissue mediastinal mass.  The thoracic esophagus is unremarkable. Nonspecific 9 mm para esophageal lymph node just above the GE junction remains stable dating back to January 2011 and is therefore likely reactive/benign.  Heart/Vascular: Adequate opacification of the pulmonary arteries to the proximal subsegmental level.  No central filling defect to suggest acute pulmonary embolus.  Conventional three-vessel arch anatomy.  No aneurysmal dilatation.  Normal-caliber central and main pulmonary arteries.  Scattered atherosclerotic calcifications in the transverse aorta and also within the coronary arteries. Mild cardiomegaly.  Lungs/Pleura: Mild to moderate combined paraseptal and centrilobular emphysema predominately affecting the upper lungs with some focal bullous change versus pulmonary cyst formation in the medial posterior right lower lobe.  Linear atelectasis versus scarring noted in the bilateral upper and lower lobes.  Mild lower lobe and central bronchial wall thickening.  Upper Abdomen: Stable 2.6 cm hypoattenuating lesion in the medial dome of the liver dating back to at least January 2011.  Bones: No acute fracture or aggressive appearing lytic or blastic osseous lesion.  IMPRESSION:  1.  Negative for pulmonary embolus, pneumonia or other acute cardiopulmonary process. 2.  Stable areas of chronic atelectasis versus scarring in the bilateral upper and lower lobes  3.  Mild - moderate emphysema and chronic bronchial wall thickening consistent with COPD 4.  Atherosclerosis including coronary artery disease 5.  Mild cardiomegaly   Original Report Authenticated By: Malachy Moan, M.D.  Mri Brain Without Contrast  02/02/2013  *RADIOLOGY REPORT*  Clinical Data: Episode of confusion and delayed response.  Stroke.  MRI HEAD WITHOUT CONTRAST  Technique:  Multiplanar, multiecho pulse sequences of the brain and surrounding structures were obtained according to standard protocol without intravenous contrast.  Comparison: CT head without contrast 02/01/2013.  Findings: No acute infarct, hemorrhage, mass lesion is present. The atrophy and white matter disease is advanced for age.  The ventricles are proportionate to the degree of atrophy.  No hemorrhage or mass lesion is evident.  Flow is present in the major intracranial arteries.  The globes and orbits are intact.  Fluid levels present in the left maxillary sinus.  Mucosal thickening is asymmetric in the left anterior ethmoid air cells and frontal sinus.  The sphenoid sinuses are clear.  There is some fluid in the right mastoid air cells.  The visualized salivary glands are within normal limits.  IMPRESSION:  1.  Atrophy and advanced white matter disease bilaterally.  This is nonspecific, but likely reflects the sequelae of chronic microvascular ischemia. 2.  No acute intracranial abnormality. 3.  Left anterior paranasal sinus disease with a fluid level on the left maxillary sinus suggesting acute sinusitis. 4.  Right mastoid effusion.  No obstructing nasopharyngeal lesion is evident.   Original Report Authenticated By: Marin Roberts, M.D.       A/P: 72 y.o. male   Admitted with recent history of DVT in 2010 requiring coumadin 5 mg daily, Lupus on Plaquenil 200 mg bid (and steroids until 4 weeks ago) asked to see for evaluation of thrombocytopenia.  Contributing factors include acute illness (cultures pending,  plus/minus ?viral source), anticoagulation among other etiologies.Smear has been ordered for review. DIC panel,   HIT panel pending.   Dr. Arbutus Ped   is to see the patient following this consult with recommendations regarding diagnosis and  further workup studies. An addendum to this note is to be written.  Thank you for the referral.  Jose Bush 02/04/2013 2:38 PM  Hematology/oncology attending: The patient seen and examined. I agree with the above note. His wife is at the bedside. He was admitted a few days ago with fever as well as fatigue and weakness. He was started on treatment with doxycycline for questionable Lyme disease. He also has swelling of the left lower extremity and repeat duplex of the lower extremity showed evidence of chronic versus subacute deep venous thrombosis of the left popliteal and posterior tibial veins. The patient is on chronic treatment with anticoagulation with Coumadin for history of deep venous thrombosis and lupus. He was switched to Lovenox on admission. On admission his CBC on 02/01/2013 showed normal white blood count of 4.1, normal hemoglobin 13.3 and hematocrit 38.1 as well as platelets count of 100,000. Has also been on treatment with Plaquenil for the lupus. Repeat CBC on 02/02/2013 showed good and his white blood count to 2.4 and platelets to 71,000, then on 02/04/2013 his white blood count this further down to 1.9, hemoglobin was down to 11.9, hematocrit 34.3 and platelets count further down to 4700.  I was consulted today to evaluate this patient and give recommendation regarding his pancytopenia. Peripheral blood smear today showed low white blood count as well as neutrophil count in addition to low platelets count but no other significant abnormalities specifically no schistocytes or blasts.  The patient had several studies performed today including haptoglobin that was slightly lower than the normal range, HIT panel is still pending, but Lovenox was  discontinued. The etiology of his pancytopenia is unclear at this point but this most likely drug-induced plus/minus.  Doxycycline can cause neutropenia, thrombocytopenia as well as hemolytic anemia and I advised Dr. Janee Morn to discontinue this medication. The patient is currently on Levaquin for the questionable sepsis. He is scheduled for her IVC filter placement by interventional radiology for the recurrent deep venous thrombosis while on anticoagulation. I will continue to monitor the patient closely for the next 1- 2 days and with no improvement or worsening of his pancytopenia, I would consider the patient for a bone marrow biopsy and aspirate by interventional radiology. I would consider sending the bone marrow sample for her morphology, flow cytometry and cytogenetics. Continue to hold Plaquenil. I discussed my recommendation with the patient and his wife as well as Dr. Janee Morn. I will continue to monitor his blood counts closely and intervene when needed. Thank you so much for allowing me to participate in the care of Mr. Studstill. I will continue to follow the patient with you on as-needed basis.

## 2013-02-04 NOTE — Progress Notes (Signed)
Pt. Refusing to have safety alarms even after education.   Thane Edu, RN

## 2013-02-04 NOTE — Progress Notes (Signed)
ANTICOAGULATION CONSULT NOTE - Follow-up  Pharmacy Consult for warfarin Indication: H/o DVT  No Known Allergies  Patient Measurements: Height: 5\' 8"  (172.7 cm) Weight: 211 lb 13.8 oz (96.1 kg) IBW/kg (Calculated) : 68.4  Vital Signs: Temp: 98.6 F (37 C) (05/07 0502) Temp src: Oral (05/07 0502) BP: 135/71 mmHg (05/07 0502) Pulse Rate: 85 (05/07 0502)  Labs:  Recent Labs  02/01/13 1734 02/02/13 0530 02/03/13 0540 02/04/13 0500  HGB 13.3 13.1  --  11.9*  HCT 38.1* 37.2*  --  34.3*  PLT 100* 71*  --  47*  LABPROT 19.6* 17.7* 15.8* 18.4*  INR 1.72* 1.50* 1.29 1.58*  CREATININE 0.94 0.99  --   --     Estimated Creatinine Clearance: 75.8 ml/min (by C-G formula based on Cr of 0.99).  Assessment: 72 yo male with h/o DVT (2010) presented with weakness and headache. He continues on coumadin but INR remains low at 1.58. No bleeding noted. Pt is anemic and plts are continuing to drop. Plts are 47 today. Full dose lovenox has been discontinued.   Goal of Therapy:  INR 2-3   Plan:  1. Repeat Coumadin 10mg  PO x 1 - give at noon to better assess effect on INR 2. F/u AM INR  Lysle Pearl, PharmD, BCPS Pager # [phone] 02/04/2013 8:25 AM

## 2013-02-05 ENCOUNTER — Encounter (HOSPITAL_COMMUNITY): Payer: Self-pay | Admitting: Radiology

## 2013-02-05 ENCOUNTER — Inpatient Hospital Stay (HOSPITAL_COMMUNITY): Payer: Medicare Other

## 2013-02-05 LAB — CBC WITH DIFFERENTIAL/PLATELET
Basophils Relative: 2 % — ABNORMAL HIGH (ref 0–1)
Eosinophils Absolute: 0 10*3/uL (ref 0.0–0.7)
Eosinophils Relative: 2 % (ref 0–5)
HCT: 31.4 % — ABNORMAL LOW (ref 39.0–52.0)
Hemoglobin: 11 g/dL — ABNORMAL LOW (ref 13.0–17.0)
MCH: 30.1 pg (ref 26.0–34.0)
MCHC: 35 g/dL (ref 30.0–36.0)
MCV: 86 fL (ref 78.0–100.0)
Monocytes Absolute: 0.4 10*3/uL (ref 0.1–1.0)
Neutro Abs: 1.1 10*3/uL — ABNORMAL LOW (ref 1.7–7.7)
Neutrophils Relative %: 43 % (ref 43–77)
RBC: 3.65 MIL/uL — ABNORMAL LOW (ref 4.22–5.81)

## 2013-02-05 LAB — URINALYSIS, ROUTINE W REFLEX MICROSCOPIC
Bilirubin Urine: NEGATIVE
Ketones, ur: NEGATIVE mg/dL
Leukocytes, UA: NEGATIVE
Nitrite: NEGATIVE
Specific Gravity, Urine: 1.014 (ref 1.005–1.030)
Urobilinogen, UA: 1 mg/dL (ref 0.0–1.0)
pH: 6.5 (ref 5.0–8.0)

## 2013-02-05 LAB — BASIC METABOLIC PANEL
BUN: 14 mg/dL (ref 6–23)
CO2: 22 mEq/L (ref 19–32)
Chloride: 110 mEq/L (ref 96–112)
Glucose, Bld: 99 mg/dL (ref 70–99)
Potassium: 3.7 mEq/L (ref 3.5–5.1)
Sodium: 140 mEq/L (ref 135–145)

## 2013-02-05 LAB — GLUCOSE, CAPILLARY
Glucose-Capillary: 88 mg/dL (ref 70–99)
Glucose-Capillary: 116 mg/dL — ABNORMAL HIGH (ref 70–99)
Glucose-Capillary: 92 mg/dL (ref 70–99)

## 2013-02-05 LAB — PROTIME-INR
INR: 1.94 — ABNORMAL HIGH (ref 0.00–1.49)
Prothrombin Time: 21.4 seconds — ABNORMAL HIGH (ref 11.6–15.2)

## 2013-02-05 MED ORDER — FENTANYL CITRATE 0.05 MG/ML IJ SOLN
INTRAMUSCULAR | Status: AC | PRN
  Administered 2013-02-05: 50 ug via INTRAVENOUS

## 2013-02-05 MED ORDER — MIDAZOLAM HCL 2 MG/2ML IJ SOLN
INTRAMUSCULAR | Status: AC | PRN
  Administered 2013-02-05: 1 mg via INTRAVENOUS

## 2013-02-05 MED ORDER — IOHEXOL 300 MG/ML  SOLN
100.0000 mL | Freq: Once | INTRAMUSCULAR | Status: AC | PRN
  Administered 2013-02-05: 40 mL via INTRAVENOUS

## 2013-02-05 NOTE — H&P (Signed)
Jose Bush is an 72 y.o. male.   Chief Complaint: Known LLE DVT 2010 Treated with coumadin New LLE swelling; recent doppler shows chronic vs subacute  Thrombus at L popliteal and L posterior tibial veins Switched to Lovenox on admission; off Lovenox now secondary to pancytopenia Restarted coumadin yesterday Request for retrievable Inferior Vena Cava Filter placement Admitted for fever; neutropenia HPI: HTN; DVT; Lupus  Past Medical History  Diagnosis Date  . HTN (hypertension)   . Chest pain, unspecified   . DVT (deep venous thrombosis)   . Lupus     Past Surgical History  Procedure Laterality Date  . Hernia repair      Family History  Problem Relation Age of Onset  . Hypertension Other   . Stroke Mother    Social History:  reports that he quit smoking about 34 years ago. His smoking use included Cigarettes. He smoked 0.00 packs per day. He has never used smokeless tobacco. He reports that he does not drink alcohol or use illicit drugs.  Allergies: No Known Allergies  Medications Prior to Admission  Medication Sig Dispense Refill  . Calcium Carbonate-Vitamin D (CALCIUM + D PO) Take 1 tablet by mouth every morning.      . hydroxychloroquine (PLAQUENIL) 200 MG tablet Take 200 mg by mouth 2 (two) times daily.      Marland Kitchen lisinopril-hydrochlorothiazide (PRINZIDE,ZESTORETIC) 10-12.5 MG per tablet Take 1 tablet by mouth every morning.      . Multiple Vitamin (MULTIVITAMIN WITH MINERALS) TABS Take 1 tablet by mouth every morning.      . nitroGLYCERIN (NITROSTAT) 0.4 MG SL tablet Place 0.4 mg under the tongue every 5 (five) minutes as needed for chest pain. x3 doses as needed for chest pain      . Omega-3 Fatty Acids (FISH OIL) 1200 MG CAPS Take 1 capsule by mouth every morning.      Marland Kitchen omeprazole (PRILOSEC) 20 MG capsule Take 20 mg by mouth every morning.      . warfarin (COUMADIN) 5 MG tablet Take 5 mg by mouth every morning.        Results for orders placed during the hospital  encounter of 02/01/13 (from the past 48 hour(s))  HIV ANTIBODY (ROUTINE TESTING)     Status: None   Collection Time    02/03/13 11:00 AM      Result Value Range   HIV NON REACTIVE  NON REACTIVE  QUANTIFERON TB GOLD ASSAY     Status: None   Collection Time    02/03/13 11:00 AM      Result Value Range   Interferon Gamma Release Assay NEGATIVE  NEGATIVE   Comment:     TB Ag value 2.32     Quantiferon Nil Value 2.23     Mitogen value 8.71     TB Antigen Minus Nil Value 0.09     Comment: (NOTE)     A positive result is indicated by a TB Ag value minus Nil value     greater than or equal to 0.35 IU/mL and the TB Ag value minus Nil     value must be greater than or equal to 25% of the Nil value. There may     be insufficient information in these values to differentiate between     some negative and some indeterminate test values.     The QuaniFERON TB Gold assay is intended for use as an aid in the     diagnosis of TB  infection. Negative results suggest that there is no     TB infection. In patients with high suspicion of exposure, a negative     test should be repeated. A positive test can support the diagnosis of     infection with Mycobacterium tuberculosis. Among individuals without     tuberculosis infection, a positive test may be due to exposure to M.     kansasii, M. szulgai or M. marinum.     The performance of the FDA approved QuantiFERON(R)-TB Gold test has     not been extensively evaluated with specimens from the following     groups of individuals:     A. Individuals younger than 73 years of age.     B. Pregnant women.     C. Individuals who have impaired or altered immune function such as     those who have HIV infection or AIDS, those who have     transplantation managed with immunosuppressive drugs (e.g. managed     with immunosuppressive drugs (e.g. corticosteroids, methotrexate,     azathioprine, cancer chemotherapy), and those who have other     clinical  conditions: diabetes, silicosis, chronic renal failure,     hematological disorders (e.g., leukemia and lymphomas), and other     specific malignancies (e.g., carcinoma of the head and neck or     lung).  GLUCOSE, CAPILLARY     Status: Abnormal   Collection Time    02/03/13 11:59 AM      Result Value Range   Glucose-Capillary 112 (*) 70 - 99 mg/dL  GLUCOSE, CAPILLARY     Status: Abnormal   Collection Time    02/03/13  4:27 PM      Result Value Range   Glucose-Capillary 119 (*) 70 - 99 mg/dL   Comment 1 Documented in Chart     Comment 2 Notify RN    GLUCOSE, CAPILLARY     Status: Abnormal   Collection Time    02/03/13  9:00 PM      Result Value Range   Glucose-Capillary 127 (*) 70 - 99 mg/dL   Comment 1 Documented in Chart     Comment 2 Notify RN    PROTIME-INR     Status: Abnormal   Collection Time    02/04/13  5:00 AM      Result Value Range   Prothrombin Time 18.4 (*) 11.6 - 15.2 seconds   INR 1.58 (*) 0.00 - 1.49  CBC     Status: Abnormal   Collection Time    02/04/13  5:00 AM      Result Value Range   WBC 1.9 (*) 4.0 - 10.5 K/uL   RBC 3.97 (*) 4.22 - 5.81 MIL/uL   Hemoglobin 11.9 (*) 13.0 - 17.0 g/dL   HCT 24.4 (*) 01.0 - 27.2 %   MCV 86.4  78.0 - 100.0 fL   MCH 30.0  26.0 - 34.0 pg   MCHC 34.7  30.0 - 36.0 g/dL   RDW 53.6  64.4 - 03.4 %   Platelets 47 (*) 150 - 400 K/uL   Comment: PLATELET COUNT CONFIRMED BY SMEAR     REPEATED TO VERIFY  GLUCOSE, CAPILLARY     Status: None   Collection Time    02/04/13  6:37 AM      Result Value Range   Glucose-Capillary 96  70 - 99 mg/dL   Comment 1 Documented in Chart     Comment 2 Notify RN  LACTATE DEHYDROGENASE     Status: Abnormal   Collection Time    02/04/13 10:12 AM      Result Value Range   LDH 464 (*) 94 - 250 U/L   Comment: HEMOLYSIS AT THIS LEVEL MAY AFFECT RESULT  HAPTOGLOBIN     Status: Abnormal   Collection Time    02/04/13 10:12 AM      Result Value Range   Haptoglobin 41 (*) 45 - 215 mg/dL  SAVE  SMEAR     Status: None   Collection Time    02/04/13 10:12 AM      Result Value Range   Smear Review SMEAR STAINED AND AVAILABLE FOR REVIEW    GLUCOSE, CAPILLARY     Status: Abnormal   Collection Time    02/04/13 11:42 AM      Result Value Range   Glucose-Capillary 118 (*) 70 - 99 mg/dL  GLUCOSE, CAPILLARY     Status: Abnormal   Collection Time    02/04/13  4:48 PM      Result Value Range   Glucose-Capillary 133 (*) 70 - 99 mg/dL   Comment 1 Notify RN     Comment 2 Documented in Chart    GLUCOSE, CAPILLARY     Status: Abnormal   Collection Time    02/04/13  9:29 PM      Result Value Range   Glucose-Capillary 116 (*) 70 - 99 mg/dL   Comment 1 Documented in Chart     Comment 2 Notify RN    URINALYSIS, ROUTINE W REFLEX MICROSCOPIC     Status: None   Collection Time    02/04/13 11:45 PM      Result Value Range   Color, Urine YELLOW  YELLOW   APPearance CLEAR  CLEAR   Specific Gravity, Urine 1.014  1.005 - 1.030   pH 6.5  5.0 - 8.0   Glucose, UA NEGATIVE  NEGATIVE mg/dL   Hgb urine dipstick NEGATIVE  NEGATIVE   Bilirubin Urine NEGATIVE  NEGATIVE   Ketones, ur NEGATIVE  NEGATIVE mg/dL   Protein, ur NEGATIVE  NEGATIVE mg/dL   Urobilinogen, UA 1.0  0.0 - 1.0 mg/dL   Nitrite NEGATIVE  NEGATIVE   Leukocytes, UA NEGATIVE  NEGATIVE   Comment: MICROSCOPIC NOT DONE ON URINES WITH NEGATIVE PROTEIN, BLOOD, LEUKOCYTES, NITRITE, OR GLUCOSE <1000 mg/dL.  GLUCOSE, CAPILLARY     Status: None   Collection Time    02/05/13  6:38 AM      Result Value Range   Glucose-Capillary 96  70 - 99 mg/dL   Comment 1 Documented in Chart     Comment 2 Notify RN    PROTIME-INR     Status: Abnormal   Collection Time    02/05/13  6:40 AM      Result Value Range   Prothrombin Time 21.4 (*) 11.6 - 15.2 seconds   INR 1.94 (*) 0.00 - 1.49  CBC WITH DIFFERENTIAL     Status: Abnormal (Preliminary result)   Collection Time    02/05/13  6:40 AM      Result Value Range   WBC 2.4 (*) 4.0 - 10.5 K/uL   RBC  3.65 (*) 4.22 - 5.81 MIL/uL   Hemoglobin 11.0 (*) 13.0 - 17.0 g/dL   HCT 16.1 (*) 09.6 - 04.5 %   MCV 86.0  78.0 - 100.0 fL   MCH 30.1  26.0 - 34.0 pg   MCHC 35.0  30.0 - 36.0 g/dL  RDW 15.5  11.5 - 15.5 %   Platelets 53 (*) 150 - 400 K/uL   Neutrophils Relative PENDING  43 - 77 %   Neutro Abs PENDING  1.7 - 7.7 K/uL   Band Neutrophils PENDING  0 - 10 %   Lymphocytes Relative PENDING  12 - 46 %   Lymphs Abs PENDING  0.7 - 4.0 K/uL   Monocytes Relative PENDING  3 - 12 %   Monocytes Absolute PENDING  0.1 - 1.0 K/uL   Eosinophils Relative PENDING  0 - 5 %   Eosinophils Absolute PENDING  0.0 - 0.7 K/uL   Basophils Relative PENDING  0 - 1 %   Basophils Absolute PENDING  0.0 - 0.1 K/uL   WBC Morphology PENDING     RBC Morphology PENDING     Smear Review PENDING     nRBC PENDING  0 /100 WBC   Metamyelocytes Relative PENDING     Myelocytes PENDING     Promyelocytes Absolute PENDING     Blasts PENDING     Ct Angio Chest Pe W/cm &/or Wo Cm  02/03/2013  *RADIOLOGY REPORT*  Clinical Data: Fever, tachycardia  CT ANGIOGRAPHY CHEST  Technique:  Multidetector CT imaging of the chest using the standard protocol during bolus administration of intravenous contrast. Multiplanar reconstructed images including MIPs were obtained and reviewed to evaluate the vascular anatomy.  Contrast: OMNIPAQUE IOHEXOL 350 MG/ML SOLN  Comparison: Chest x-ray obtained 02/01/2013; chest CTs 03/09/2012  Findings:  Mediastinum: Unremarkable CT appearance of the thyroid gland.  No suspicious mediastinal or hilar adenopathy.  No soft tissue mediastinal mass.  The thoracic esophagus is unremarkable. Nonspecific 9 mm para esophageal lymph node just above the GE junction remains stable dating back to January 2011 and is therefore likely reactive/benign.  Heart/Vascular: Adequate opacification of the pulmonary arteries to the proximal subsegmental level.  No central filling defect to suggest acute pulmonary embolus.   Conventional three-vessel arch anatomy.  No aneurysmal dilatation.  Normal-caliber central and main pulmonary arteries.  Scattered atherosclerotic calcifications in the transverse aorta and also within the coronary arteries. Mild cardiomegaly.  Lungs/Pleura: Mild to moderate combined paraseptal and centrilobular emphysema predominately affecting the upper lungs with some focal bullous change versus pulmonary cyst formation in the medial posterior right lower lobe.  Linear atelectasis versus scarring noted in the bilateral upper and lower lobes.  Mild lower lobe and central bronchial wall thickening.  Upper Abdomen: Stable 2.6 cm hypoattenuating lesion in the medial dome of the liver dating back to at least January 2011.  Bones: No acute fracture or aggressive appearing lytic or blastic osseous lesion.  IMPRESSION:  1.  Negative for pulmonary embolus, pneumonia or other acute cardiopulmonary process. 2.  Stable areas of chronic atelectasis versus scarring in the bilateral upper and lower lobes 3.  Mild - moderate emphysema and chronic bronchial wall thickening consistent with COPD 4.  Atherosclerosis including coronary artery disease 5.  Mild cardiomegaly   Original Report Authenticated By: Malachy Moan, M.D.     Review of Systems  Constitutional: Negative for fever.  Respiratory: Negative for shortness of breath.   Cardiovascular: Negative for chest pain.  Gastrointestinal: Negative for nausea, vomiting and abdominal pain.  Neurological: Negative for headaches.    Blood pressure 108/67, pulse 70, temperature 99.4 F (37.4 C), temperature source Oral, resp. rate 20, height 5\' 8"  (1.727 m), weight 211 lb 13.8 oz (96.1 kg), SpO2 98.00%. Physical Exam  Constitutional: He is oriented to person,  place, and time. He appears well-developed and well-nourished.  Cardiovascular: Normal rate, regular rhythm and normal heart sounds.   No murmur heard. Respiratory: Effort normal and breath sounds normal. He  has no wheezes.  GI: Soft. Bowel sounds are normal. There is no tenderness.  Musculoskeletal: Normal range of motion.  Neurological: He is alert and oriented to person, place, and time.  Skin: Skin is warm and dry.  Psychiatric: He has a normal mood and affect. His behavior is normal. Judgment and thought content normal.     Assessment/Plan New LLE swelling on coumadin Doppler - chronic vs subacute LLE thrombus Switched to Lovenox but pancytopenia so dc'd Being restarted on coumadin; but subtherapeutic Scheduled for retrievable IVC filter placement Pt and wife aware of procedure benefits and risks and agreeable to proceed consent signed and in chart    Monico Sudduth A 02/05/2013, 8:13 AM

## 2013-02-05 NOTE — ED Notes (Signed)
O2 d/c'd 

## 2013-02-05 NOTE — ED Notes (Signed)
MD at bedside. Procedure explained.  Questions answered.

## 2013-02-05 NOTE — H&P (Signed)
Agree 

## 2013-02-05 NOTE — Progress Notes (Signed)
[facility] PROGRESS NOTE    Assessment/Plan: Fever/neutropenic fever  - Questionable etiology. Patient with decreasing cell lines.  - Afebrile. Urinalysis was negative. Blood cultures with no growth to date.  - CT of the chest was negative for any acute infiltrate.  - dc'd doxycycline secondary to pancytopenia. Started on IV Levaquin empirically.  - ct angio 5.6.2014 negative for PE, doppler positive for subacute DVT. IV filter 5.8.2014.  Pancytopenia  - White count is at 1.9  5.7.2014 improved today after discontinuation of doxy. -  Hemoglobin is 11.0. Platelets at increased.  LDH is elevated at 464 and haptoglobin is decreased at 41.? Hemolysis. Peripheral smear is pending. - lyme titers negative. - hematology, rec BM bx and IVC fliter for recurrent DVT while on anticoagulation.  History of lupus  - Will hold plaquenil for now secondary to pancytopenia. Once counts have improved may resume.   Hypertension  Stable. Continue current regimen  Code Status: full  Family Communication: none  Disposition Plan: home in am    Consultants:  Hematology: Dr. Arlis Porta 02/04/2013 Procedures:  CT anterior chest 02/03/2013  CT head 02/01/2013  MRI of the head 02/02/2013  Lower extremity Dopplers 02/03/2013  Chest x-ray 02/01/2013 Antibiotics:  Doxycycline 02/01/2013 2 02/04/2013 levaquin 5.7.2014  HPI/Subjective: Feels good, want to go home  Objective: Filed Vitals:   02/04/13 2126 02/05/13 0120 02/05/13 0519 02/05/13 1003  BP: 126/65 119/64 108/67 125/70  Pulse: 69 70 70 69  Temp: 98.6 F (37 C) 99.1 F (37.3 C) 99.4 F (37.4 C)   TempSrc: Oral Oral Oral   Resp: 18 20 20 16   Height:      Weight:      SpO2: 98% 97% 98% 97%    Intake/Output Summary (Last 24 hours) at 02/05/13 1014 Last data filed at 02/04/13 1300  Gross per 24 hour  Intake    240 ml  Output      0 ml  Net    240 ml   Filed Weights   02/02/13 0026  Weight: 96.1 kg (211 lb 13.8  oz)    Exam:  General: Alert, awake, oriented x3, in no acute distress.  HEENT: No bruits, no goiter.  Heart: Regular rate and rhythm, without murmurs, rubs, gallops.  Lungs: Good air movement, clear to auscultation. Abdomen: Soft, nontender, nondistended, positive bowel sounds.  Neuro: Grossly intact, nonfocal.   Data Reviewed: Basic Metabolic Panel:  Recent Labs Lab 02/01/13 1734 02/02/13 0530 02/05/13 0640  NA 134* 135 140  K 3.7 4.1 3.7  CL 100 101 110  CO2 22 25 22   GLUCOSE 138* 116* 99  BUN 21 20 14   CREATININE 0.94 0.99 0.75  CALCIUM 9.0 8.6 8.1*   Liver Function Tests:  Recent Labs Lab 02/01/13 1734 02/02/13 0530  AST 61* 79*  ALT 53 65*  ALKPHOS 46 45  BILITOT 0.6 0.9  PROT 6.8 6.5  ALBUMIN 3.4* 3.3*   No results found for this basename: LIPASE, AMYLASE,  in the last 168 hours No results found for this basename: AMMONIA,  in the last 168 hours CBC:  Recent Labs Lab 02/01/13 1734 02/02/13 0530 02/04/13 0500 02/05/13 0640  WBC 4.1 2.4* 1.9* 2.4*  NEUTROABS 3.6 1.8  --  1.1*  HGB 13.3 13.1 11.9* 11.0*  HCT 38.1* 37.2* 34.3* 31.4*  MCV 87.2 86.7 86.4 86.0  PLT 100* 71* 47* 53*   Cardiac Enzymes: No results found for this basename: CKTOTAL, CKMB, CKMBINDEX, TROPONINI,  in the last  168 hours BNP (last 3 results) No results found for this basename: PROBNP,  in the last 8760 hours CBG:  Recent Labs Lab 02/04/13 0637 02/04/13 1142 02/04/13 1648 02/04/13 2129 02/05/13 0638  GLUCAP 96 118* 133* 116* 96    Recent Results (from the past 240 hour(s))  CULTURE, BLOOD (ROUTINE X 2)     Status: None   Collection Time    02/01/13 10:10 PM      Result Value Range Status   Specimen Description BLOOD RIGHT ARM   Final   Special Requests BOTTLES DRAWN AEROBIC AND ANAEROBIC 10CC EACH   Final   Culture  Setup Time 02/02/2013 02:05   Final   Culture     Final   Value:        BLOOD CULTURE RECEIVED NO GROWTH TO DATE CULTURE WILL BE HELD FOR 5 DAYS  BEFORE ISSUING A FINAL NEGATIVE REPORT   Report Status PENDING   Incomplete  CULTURE, BLOOD (ROUTINE X 2)     Status: None   Collection Time    02/01/13 10:20 PM      Result Value Range Status   Specimen Description BLOOD RIGHT HAND   Final   Special Requests BOTTLES DRAWN AEROBIC ONLY 10CC   Final   Culture  Setup Time 02/02/2013 02:05   Final   Culture     Final   Value:        BLOOD CULTURE RECEIVED NO GROWTH TO DATE CULTURE WILL BE HELD FOR 5 DAYS BEFORE ISSUING A FINAL NEGATIVE REPORT   Report Status PENDING   Incomplete     Studies: Ct Angio Chest Pe W/cm &/or Wo Cm  02/03/2013  *RADIOLOGY REPORT*  Clinical Data: Fever, tachycardia  CT ANGIOGRAPHY CHEST  Technique:  Multidetector CT imaging of the chest using the standard protocol during bolus administration of intravenous contrast. Multiplanar reconstructed images including MIPs were obtained and reviewed to evaluate the vascular anatomy.  Contrast: OMNIPAQUE IOHEXOL 350 MG/ML SOLN  Comparison: Chest x-ray obtained 02/01/2013; chest CTs 03/09/2012  Findings:  Mediastinum: Unremarkable CT appearance of the thyroid gland.  No suspicious mediastinal or hilar adenopathy.  No soft tissue mediastinal mass.  The thoracic esophagus is unremarkable. Nonspecific 9 mm para esophageal lymph node just above the GE junction remains stable dating back to January 2011 and is therefore likely reactive/benign.  Heart/Vascular: Adequate opacification of the pulmonary arteries to the proximal subsegmental level.  No central filling defect to suggest acute pulmonary embolus.  Conventional three-vessel arch anatomy.  No aneurysmal dilatation.  Normal-caliber central and main pulmonary arteries.  Scattered atherosclerotic calcifications in the transverse aorta and also within the coronary arteries. Mild cardiomegaly.  Lungs/Pleura: Mild to moderate combined paraseptal and centrilobular emphysema predominately affecting the upper lungs with some focal bullous  change versus pulmonary cyst formation in the medial posterior right lower lobe.  Linear atelectasis versus scarring noted in the bilateral upper and lower lobes.  Mild lower lobe and central bronchial wall thickening.  Upper Abdomen: Stable 2.6 cm hypoattenuating lesion in the medial dome of the liver dating back to at least January 2011.  Bones: No acute fracture or aggressive appearing lytic or blastic osseous lesion.  IMPRESSION:  1.  Negative for pulmonary embolus, pneumonia or other acute cardiopulmonary process. 2.  Stable areas of chronic atelectasis versus scarring in the bilateral upper and lower lobes 3.  Mild - moderate emphysema and chronic bronchial wall thickening consistent with COPD 4.  Atherosclerosis including coronary artery  disease 5.  Mild cardiomegaly   Original Report Authenticated By: Malachy Moan, M.D.     Scheduled Meds: . levofloxacin (LEVAQUIN) IV  750 mg Intravenous Q24H  . lisinopril  10 mg Oral Daily  . omega-3 acid ethyl esters  1 g Oral Daily  . pantoprazole  40 mg Oral Daily   Continuous Infusions: . sodium chloride 1,000 mL (02/04/13 1906)     Marinda Elk  [facility] Pager [phone]. If 8PM-8AM, please contact night-coverage at [email or web address], password [facility] 02/05/2013, 10:14 AM  LOS: 4 days

## 2013-02-05 NOTE — Procedures (Signed)
Procedure:  IVC filter placement Findings:  Normally patent IVC.  [place] IVC filter placed in infrarenal IVC.

## 2013-02-06 LAB — GLUCOSE, CAPILLARY: Glucose-Capillary: 101 mg/dL — ABNORMAL HIGH (ref 70–99)

## 2013-02-06 LAB — HEPARIN INDUCED THROMBOCYTOPENIA PNL
Heparin Induced Plt Ab: NEGATIVE
Patient O.D.: 0.092
UFH Low Dose 0.1 IU/mL: 0 % Release
UFH Low Dose 0.5 IU/mL: 0 % Release

## 2013-02-06 LAB — URINE CULTURE

## 2013-02-06 LAB — PROTIME-INR: INR: 2.08 — ABNORMAL HIGH (ref 0.00–1.49)

## 2013-02-06 MED ORDER — LEVOFLOXACIN 500 MG PO TABS: 500.0000 mg | ORAL_TABLET | Freq: Every day | ORAL | Status: AC

## 2013-02-06 NOTE — Care Management Note (Signed)
    Page 1 of 1   02/06/2013     3:58:12 PM   CARE MANAGEMENT NOTE 02/06/2013  Patient:  Jose Bush, Jose Bush   Account Number:  [id number]  Date Initiated:  02/02/2013  Documentation initiated by:  [facility]  Subjective/Objective Assessment:   admitted with weakness, confusion  lives with spouse     Action/Plan:   iVC filter placed 02/05/13   Anticipated DC Date:  02/05/2013   Anticipated DC Plan:  HOME/SELF CARE      DC Planning Services  CM consult      Choice offered to / List presented to:             Status of service:  Completed, signed off Medicare Important Message given?   (If response is "NO", the following Medicare IM given date fields will be blank) Date Medicare IM given:   Date Additional Medicare IM given:    Discharge Disposition:  HOME/SELF CARE  Per UR Regulation:  Reviewed for med. necessity/level of care/duration of stay  If discussed at Long Length of Stay Meetings, dates discussed:    Comments:

## 2013-02-06 NOTE — Progress Notes (Signed)
D/c instructions reviewed with pt and wife. Copy of instructions and script given to pt. Education handout given on thrombocytopenia, pt admitted with fever and weakness, dx with DVT and placement of IVC filter placed. (no stroke event). See MD progress notes and H&P. Pt d/c via wheelchair with belongings with wife, escorted by unit NT.

## 2013-02-06 NOTE — Discharge Summary (Signed)
Physician Discharge Summary  Jose Bush:[id number] DOB: 07/01/1941 DOA: 02/01/2013  PCP: Selinda Flavin, MD  Admit date: 02/01/2013 Discharge date: 02/06/2013  Time spent: 60 minutes  Recommendations for Outpatient Follow-up:  1. Patient is to followup with PCP one week post discharge. On followup and she'll need a CBC checked to followup on his pancytopenia. Patient's PCP will need to decide whether to resume patient's Coumadin as this has been discontinued secondary to his thrombocytopenia. Patient had an IVC filter placed during this hospitalization. 2. Patient is to followup with his rheumatologist Dr. Kellie Simmering, one week post discharge. Patient's Plaquenil was discontinued secondary to his pancytopenia. Patient's Coumadin was also discontinued as well. Will defer resumption of patient's Plaquenil to his rheumatologist.  Discharge Diagnoses:  Principal Problem:   Fever Active Problems:   HYPERTENSION, UNSPECIFIED   DVT (deep venous thrombosis)   Weakness   Rigors   Headache   Lupus   Thrombocytopenia   Neutropenia   Other pancytopenia   Discharge Condition: Stable and improved  Diet recommendation: Regular  Filed Weights   02/02/13 0026  Weight: 96.1 kg (211 lb 13.8 oz)    History of present illness:  Jose Bush is a 72 y.o. male history of lupus, DVT on Coumadin, hypertension was brought to the ER after patient has been having weakness headache. Earlier today around 8:30 AM patient had gone to the church and over there patient was found to be confused weak and slow in doing things. Patient's gait was not normal. But patient did not lose consciousness or did not have any loss of function of the upper or lower extremities. Patient has been experiencing headache and neck pain for the last 3 days and subjective feeling of chills and rigors. His headache is mostly on the frontal area. Denies any associated blurred vision difficulty speaking or swallowing. Patient states  that he has been experiencing chills and rigors last 2 nights but there was no recorded fever. Denies any chest pain has had mild shortness of breath denies any productive cough nausea vomiting abdominal pain diarrhea or dysuria. Denies any insect bites. In the ER patient was found to be afebrile and nonfocal and his symptoms are largely resolved. CT head was negative for any acute. Labs revealed mild thrombocytopenia. UA chest x-ray unremarkable and EKG shows normal sinus rhythm. Patient at this time will be admitted for further management.  Patient has history of lupus and was under remission until 3 months ago when he started having chest pain and as per patient chest x-ray showed infiltrates and patient was placed on steroids and was eventually started on Plaquenil and is taken off steroids recently last one month ago   Hospital Course:  #1. Fever/neutropenic fever  Questionable etiology. Patient was admitted with confusion generalized weakness some chills riders and fever. Patient did not have any nuchal rigidity. Patient was pan cultured. Blood cultures which were obtained with no growth to date. Urinalysis which was done was negative. Repeat blood cultures were obtained with no growth to date. CT of the chest was negative for any acute infiltrate. HIV was negative. IgM for RMSF was also negative. Lyme titers were also negative. MRI of the head and CT scan of the head which were done were negative. Patient during the hospitalization was noted to have a pancytopenia as well. He was initially placed on doxycycline on admission. Doxycycline was discontinued as was felt this could have led to patient's pancytopenia as well as his back and now.  Patient was empirically started on IV Levaquin. Patient's fevers resolved and was afebrile x48 hours prior to discharge. Patient be discharged home on 5 more days of oral Levaquin to complete a one-week course of antibiotic therapy. Patient will followup with PCP as  outpatient.  #2 pancytopenia  During the hospitalization patient was noted to have a pancytopenia with a decrease in all cell lines since admission. Patient's platelets drop to as low was 47. His white count went as low as 1.9. In a hemoglobin of 11.9. Patient was noted to be on full dose Lovenox on admission due to a subtherapeutic INR. Lovenox was subsequently discontinued. Patient's Coumadin was also discontinued. And a HITT panel was obtained.  LDH is elevated at 464 and haptoglobin is decreased at 41.? Hemolysis. Peripheral smear with 53 platelets. Doxycycline and plaquenil were discontinued. Patient did not have any bleeding during the hospitalization. Hematology consultation was obtained and patient was seen in consultation by Dr. Arlis Porta. Patient improved and patient's pancytopenia improved. Patient's white count went up to 2.4 and his platelet count went up to 53 with a hemoglobin of 11. Patient did not have any signs or symptoms of bleeding. Patient will be discharged home off his Plaquenil and Coumadin. Patient will followup with PCP as outpatient. On followup she'll need a CBC done. Will defer resumption of patient's Coumadin and Plaquenil to his PCP and his rheumatologist. An IVC filter was placed during this hospitalization. #3 history of lupus  Remained stable throughout the hospitalization. Due to patient's pancytopenia during the hospitalization patient's Plaquenil was discontinued. Patient will followup with his rheumatologist as outpatient who will decide when to resume his plaquenil. #4 history of DVT  Patient with prior history of DVT and also history of lupus was on Coumadin. However patient's platelet count dropped to 47 and patient was pancytopenic. Patient on admission was started on full dose Lovenox due to a subtherapeutic INR. However due to patient's thrombocytopenia Lovenox and his Coumadin was discontinued. An IVC filter was subsequently placed on 02/05/2013. Patient  will be discharged of his Coumadin. Patient will followup with PCP one week post discharge. His CBC will need to be obtained on followup to followup on patient's platelets. He'll be deferred to patient's PCP as to whether to resume patient's Coumadin.  #5 hypertension  Stable. Continue current regimen.   The rest of patient's chronic medical issues remained stable throughout the hospitalization.     Procedures: CT anterior chest 02/03/2013  CT head 02/01/2013  MRI of the head 02/02/2013  Lower extremity Dopplers 02/03/2013  Chest x-ray 02/01/2013 IVC filter 02/05/2013 Dr. Fredia Sorrow     Consultations: Hematology: Dr. Arlis Porta 02/04/2013   Discharge Exam: Filed Vitals:   02/05/13 2120 02/06/13 0131 02/06/13 0500 02/06/13 0900  BP: 123/77 133/67 116/59 131/69  Pulse: 61 62  66  Temp: 98 F (36.7 C) 98 F (36.7 C) 97.9 F (36.6 C) 98 F (36.7 C)  TempSrc: Oral Oral Oral Oral  Resp: 18 18 18 16   Height:      Weight:      SpO2: 99% 98% 98% 100%    General: NAD Cardiovascular: RRR Respiratory: CTAB  Discharge Instructions      Discharge Orders   Future Orders Complete By Expires     Diet general  As directed     Discharge instructions  As directed     Comments:      Follow up with Selinda Flavin, MD in 1 week. Follow up with Dr  Truslow in 1 week.    Increase activity slowly  As directed         Medication List    STOP taking these medications       hydroxychloroquine 200 MG tablet  Commonly known as:  PLAQUENIL     warfarin 5 MG tablet  Commonly known as:  COUMADIN      TAKE these medications       CALCIUM + D PO  Take 1 tablet by mouth every morning.     Fish Oil 1200 MG Caps  Take 1 capsule by mouth every morning.     levofloxacin 500 MG tablet  Commonly known as:  LEVAQUIN  Take 1 tablet (500 mg total) by mouth daily. Take for 5 days then stop.     lisinopril-hydrochlorothiazide 10-12.5 MG per tablet  Commonly known as:   PRINZIDE,ZESTORETIC  Take 1 tablet by mouth every morning.     multivitamin with minerals Tabs  Take 1 tablet by mouth every morning.     NITROSTAT 0.4 MG SL tablet  Generic drug:  nitroGLYCERIN  Place 0.4 mg under the tongue every 5 (five) minutes as needed for chest pain. x3 doses as needed for chest pain     omeprazole 20 MG capsule  Commonly known as:  PRILOSEC  Take 20 mg by mouth every morning.       No Known Allergies Follow-up Information   Follow up with Selinda Flavin, MD. Schedule an appointment as soon as possible for a visit in 1 week.   Contact information:   [address] [place] [address] [phone]       Follow up with Donnal Moat, MD. Schedule an appointment as soon as possible for a visit in 1 week.   Contact information:   [address] [place] [address] [phone]        The results of significant diagnostics from this hospitalization (including imaging, microbiology, ancillary and laboratory) are listed below for reference.    Significant Diagnostic Studies: Dg Chest 2 View  02/01/2013  *RADIOLOGY REPORT*  Clinical Data: Altered mental status.  CHEST - 2 VIEW  Comparison: 03/25/2012  Findings: Heart is normal size.  Stable mild chronic linear densities of the lungs, likely scarring.  No acute opacities or effusions.  No acute bony abnormality.  IMPRESSION: No acute cardiopulmonary disease.   Original Report Authenticated By: Charlett Nose, M.D.    Ct Head Wo Contrast  02/01/2013  *RADIOLOGY REPORT*  Clinical Data: Headache, dizziness  CT HEAD WITHOUT CONTRAST  Technique:  Contiguous axial images were obtained from the base of the skull through the vertex without contrast.  Comparison: Brain MRI 01/05/2010  Findings: No skull fracture is noted.  Paranasal sinuses mastoid air cells are unremarkable.  No intracranial hemorrhage, mass effect or midline shift.  No acute infarction.  No mass lesion is noted on this unenhanced scan.  Moderate  cerebral atrophy. Small lacunar infarct left parietal lobe posteriorly is stable.  Stable periventricular and patchy subcortical chronic white matter disease.  IMPRESSION: No acute intracranial abnormality.  Stable atrophy and chronic white matter disease.   Original Report Authenticated By: Natasha Mead, M.D.    Ct Angio Chest Pe W/cm &/or Wo Cm  02/03/2013  *RADIOLOGY REPORT*  Clinical Data: Fever, tachycardia  CT ANGIOGRAPHY CHEST  Technique:  Multidetector CT imaging of the chest using the standard protocol during bolus administration of intravenous contrast. Multiplanar reconstructed images including MIPs were obtained and reviewed to evaluate  the vascular anatomy.  Contrast: OMNIPAQUE IOHEXOL 350 MG/ML SOLN  Comparison: Chest x-ray obtained 02/01/2013; chest CTs 03/09/2012  Findings:  Mediastinum: Unremarkable CT appearance of the thyroid gland.  No suspicious mediastinal or hilar adenopathy.  No soft tissue mediastinal mass.  The thoracic esophagus is unremarkable. Nonspecific 9 mm para esophageal lymph node just above the GE junction remains stable dating back to January 2011 and is therefore likely reactive/benign.  Heart/Vascular: Adequate opacification of the pulmonary arteries to the proximal subsegmental level.  No central filling defect to suggest acute pulmonary embolus.  Conventional three-vessel arch anatomy.  No aneurysmal dilatation.  Normal-caliber central and main pulmonary arteries.  Scattered atherosclerotic calcifications in the transverse aorta and also within the coronary arteries. Mild cardiomegaly.  Lungs/Pleura: Mild to moderate combined paraseptal and centrilobular emphysema predominately affecting the upper lungs with some focal bullous change versus pulmonary cyst formation in the medial posterior right lower lobe.  Linear atelectasis versus scarring noted in the bilateral upper and lower lobes.  Mild lower lobe and central bronchial wall thickening.  Upper Abdomen: Stable 2.6 cm  hypoattenuating lesion in the medial dome of the liver dating back to at least January 2011.  Bones: No acute fracture or aggressive appearing lytic or blastic osseous lesion.  IMPRESSION:  1.  Negative for pulmonary embolus, pneumonia or other acute cardiopulmonary process. 2.  Stable areas of chronic atelectasis versus scarring in the bilateral upper and lower lobes 3.  Mild - moderate emphysema and chronic bronchial wall thickening consistent with COPD 4.  Atherosclerosis including coronary artery disease 5.  Mild cardiomegaly   Original Report Authenticated By: Malachy Moan, M.D.    Mri Brain Without Contrast  02/02/2013  *RADIOLOGY REPORT*  Clinical Data: Episode of confusion and delayed response.  Stroke.  MRI HEAD WITHOUT CONTRAST  Technique:  Multiplanar, multiecho pulse sequences of the brain and surrounding structures were obtained according to standard protocol without intravenous contrast.  Comparison: CT head without contrast 02/01/2013.  Findings: No acute infarct, hemorrhage, mass lesion is present. The atrophy and white matter disease is advanced for age.  The ventricles are proportionate to the degree of atrophy.  No hemorrhage or mass lesion is evident.  Flow is present in the major intracranial arteries.  The globes and orbits are intact.  Fluid levels present in the left maxillary sinus.  Mucosal thickening is asymmetric in the left anterior ethmoid air cells and frontal sinus.  The sphenoid sinuses are clear.  There is some fluid in the right mastoid air cells.  The visualized salivary glands are within normal limits.  IMPRESSION:  1.  Atrophy and advanced white matter disease bilaterally.  This is nonspecific, but likely reflects the sequelae of chronic microvascular ischemia. 2.  No acute intracranial abnormality. 3.  Left anterior paranasal sinus disease with a fluid level on the left maxillary sinus suggesting acute sinusitis. 4.  Right mastoid effusion.  No obstructing nasopharyngeal  lesion is evident.   Original Report Authenticated By: Marin Roberts, M.D.    Ir Ivc Filter Plmt / S&i /img Guid/mod Sed  02/05/2013  *RADIOLOGY REPORT*  Clinical Data:  Left lower extremity DVT despite anticoagulation. There is contraindication to Lovenox and heparin therapy due to development of pancytopenia and significant thrombocytopenia.  The patient requires an IVC filter.  1.  ULTRASOUND GUIDANCE FOR VASCULAR ACCESS OF THE RIGHT INTERNAL JUGULAR VEIN. 2.  IVC VENOGRAM 3.  PERCUTANEOUS IVC FILTER PLACEMENT  Sedation:  1.0 mg IV Versed; 50 mcg IV  Fentanyl.  Total Moderate Sedation Time:  11 minutes.  Contrast:  40 ml Omnipaque-300  Fluoroscopy Time: 1 minute and 12 seconds  Procedure:  The procedure, risks, benefits, and alternatives were explained to the patient.  Questions regarding the procedure were encouraged and answered.  The patient understands and consents to the procedure.  The right neck was prepped with Betadine in a sterile fashion, and a sterile drape was applied covering the operative field.  A sterile gown and sterile gloves were used for the procedure.  Local anesthesia was provided with 1% Lidocaine.  Under direct ultrasound guidance, a 21 gauge needle was advanced into the right internal jugular vein with ultrasound image documentation performed.  After securing access with a micropuncture dilator, a guidewire was advanced into the inferior vena cava.  A deployment sheath was advanced over the guidewire. This was utilized to perform IVC venography.  The deployment sheath was further positioned in an appropriate location for filter deployment.  A Bard Denali IVC filter was then advanced in the sheath.  This was then fully deployed in the infrarenal IVC.  Final filter position was confirmed with a fluoroscopic spot image.  Contrast injection was also performed through the sheath under fluoroscopy to confirm patency of the IVC at the level of the filter.  After the procedure the sheath  was removed and hemostasis obtained with manual compression.  Complications: None  Findings:  IVC venography demonstrates a normal caliber IVC with no evidence of thrombus.  Renal veins are identified bilaterally.  The IVC filter was successfully positioned below the level of the renal veins and is appropriately oriented.  This IVC filter has both permanent and retrievable indications.  IMPRESSION:  Placement of percutaneous IVC filter in infrarenal IVC.  IVC venogram shows no evidence of IVC thrombus and normal caliber of the inferior vena cava.  This filter does have both permanent and retrievable indications.   Original Report Authenticated By: Irish Lack, M.D.     Microbiology: Recent Results (from the past 240 hour(s))  CULTURE, BLOOD (ROUTINE X 2)     Status: None   Collection Time    02/01/13 10:10 PM      Result Value Range Status   Specimen Description BLOOD RIGHT ARM   Final   Special Requests BOTTLES DRAWN AEROBIC AND ANAEROBIC 10CC EACH   Final   Culture  Setup Time 02/02/2013 02:05   Final   Culture     Final   Value:        BLOOD CULTURE RECEIVED NO GROWTH TO DATE CULTURE WILL BE HELD FOR 5 DAYS BEFORE ISSUING A FINAL NEGATIVE REPORT   Report Status PENDING   Incomplete  CULTURE, BLOOD (ROUTINE X 2)     Status: None   Collection Time    02/01/13 10:20 PM      Result Value Range Status   Specimen Description BLOOD RIGHT HAND   Final   Special Requests BOTTLES DRAWN AEROBIC ONLY 10CC   Final   Culture  Setup Time 02/02/2013 02:05   Final   Culture     Final   Value:        BLOOD CULTURE RECEIVED NO GROWTH TO DATE CULTURE WILL BE HELD FOR 5 DAYS BEFORE ISSUING A FINAL NEGATIVE REPORT   Report Status PENDING   Incomplete  CULTURE, BLOOD (ROUTINE X 2)     Status: None   Collection Time    02/04/13  7:46 PM      Result Value  Range Status   Specimen Description BLOOD LEFT ARM   Final   Special Requests BOTTLES DRAWN AEROBIC AND ANAEROBIC 10CC   Final   Culture  Setup Time  02/05/2013 06:03   Final   Culture     Final   Value:        BLOOD CULTURE RECEIVED NO GROWTH TO DATE CULTURE WILL BE HELD FOR 5 DAYS BEFORE ISSUING A FINAL NEGATIVE REPORT   Report Status PENDING   Incomplete  CULTURE, BLOOD (ROUTINE X 2)     Status: None   Collection Time    02/04/13  7:52 PM      Result Value Range Status   Specimen Description BLOOD LEFT HAND   Final   Special Requests BOTTLES DRAWN AEROBIC ONLY 10CC   Final   Culture  Setup Time 02/05/2013 06:03   Final   Culture     Final   Value:        BLOOD CULTURE RECEIVED NO GROWTH TO DATE CULTURE WILL BE HELD FOR 5 DAYS BEFORE ISSUING A FINAL NEGATIVE REPORT   Report Status PENDING   Incomplete  URINE CULTURE     Status: None   Collection Time    02/04/13 11:45 PM      Result Value Range Status   Specimen Description URINE, RANDOM   Final   Special Requests NONE   Final   Culture  Setup Time 02/05/2013 01:20   Final   Colony Count NO GROWTH   Final   Culture NO GROWTH   Final   Report Status 02/06/2013 FINAL   Final     Labs: Basic Metabolic Panel:  Recent Labs Lab 02/01/13 1734 02/02/13 0530 02/05/13 0640  NA 134* 135 140  K 3.7 4.1 3.7  CL 100 101 110  CO2 22 25 22   GLUCOSE 138* 116* 99  BUN 21 20 14   CREATININE 0.94 0.99 0.75  CALCIUM 9.0 8.6 8.1*   Liver Function Tests:  Recent Labs Lab 02/01/13 1734 02/02/13 0530  AST 61* 79*  ALT 53 65*  ALKPHOS 46 45  BILITOT 0.6 0.9  PROT 6.8 6.5  ALBUMIN 3.4* 3.3*   No results found for this basename: LIPASE, AMYLASE,  in the last 168 hours No results found for this basename: AMMONIA,  in the last 168 hours CBC:  Recent Labs Lab 02/01/13 1734 02/02/13 0530 02/04/13 0500 02/05/13 0640  WBC 4.1 2.4* 1.9* 2.4*  NEUTROABS 3.6 1.8  --  1.1*  HGB 13.3 13.1 11.9* 11.0*  HCT 38.1* 37.2* 34.3* 31.4*  MCV 87.2 86.7 86.4 86.0  PLT 100* 71* 47* 53*   Cardiac Enzymes: No results found for this basename: CKTOTAL, CKMB, CKMBINDEX, TROPONINI,  in the last  168 hours BNP: BNP (last 3 results) No results found for this basename: PROBNP,  in the last 8760 hours CBG:  Recent Labs Lab 02/05/13 1117 02/05/13 1630 02/05/13 2123 02/06/13 0812 02/06/13 1150  GLUCAP 88 116* 92 117* 101*       Signed:  Donyell Ding  [facility] 02/06/2013, 2:33 PM

## 2013-02-08 LAB — CULTURE, BLOOD (ROUTINE X 2): Culture: NO GROWTH

## 2013-02-11 LAB — CULTURE, BLOOD (ROUTINE X 2): Culture: NO GROWTH

## 2013-05-28 ENCOUNTER — Ambulatory Visit (HOSPITAL_COMMUNITY)
Admission: RE | Admit: 2013-05-28 | Discharge: 2013-05-28 | Disposition: A | Discharge status: Home / Self Care | Payer: Medicare Other | Source: Ambulatory Visit | Attending: Rheumatology | Admitting: Rheumatology

## 2013-05-28 ENCOUNTER — Other Ambulatory Visit (HOSPITAL_COMMUNITY): Payer: Self-pay | Admitting: Rheumatology

## 2013-05-28 DIAGNOSIS — M5137 Other intervertebral disc degeneration, lumbosacral region: Secondary | ICD-10-CM | POA: Insufficient documentation

## 2013-05-28 DIAGNOSIS — M545 Low back pain, unspecified: Secondary | ICD-10-CM | POA: Insufficient documentation

## 2013-05-28 DIAGNOSIS — M51379 Other intervertebral disc degeneration, lumbosacral region without mention of lumbar back pain or lower extremity pain: Secondary | ICD-10-CM | POA: Insufficient documentation

## 2013-10-20 ENCOUNTER — Ambulatory Visit
Admission: RE | Admit: 2013-10-20 | Discharge: 2013-10-20 | Disposition: A | Discharge status: Home / Self Care | Payer: Medicare HMO | Source: Ambulatory Visit | Attending: Rheumatology | Admitting: Rheumatology

## 2013-10-20 ENCOUNTER — Other Ambulatory Visit: Payer: Self-pay | Admitting: Rheumatology

## 2013-10-20 DIAGNOSIS — M25562 Pain in left knee: Principal | ICD-10-CM

## 2013-10-20 DIAGNOSIS — M25561 Pain in right knee: Secondary | ICD-10-CM

## 2014-10-04 DIAGNOSIS — I82499 Acute embolism and thrombosis of other specified deep vein of unspecified lower extremity: Secondary | ICD-10-CM | POA: Diagnosis not present

## 2014-10-07 ENCOUNTER — Emergency Department (HOSPITAL_COMMUNITY): Payer: Commercial Managed Care - HMO

## 2014-10-07 ENCOUNTER — Encounter (HOSPITAL_COMMUNITY): Payer: Self-pay | Admitting: *Deleted

## 2014-10-07 ENCOUNTER — Emergency Department (HOSPITAL_COMMUNITY)
Admission: EM | Admit: 2014-10-07 | Discharge: 2014-10-07 | Disposition: A | Discharge status: Home / Self Care | Payer: Commercial Managed Care - HMO | Attending: Emergency Medicine | Admitting: Emergency Medicine

## 2014-10-07 DIAGNOSIS — Z79899 Other long term (current) drug therapy: Secondary | ICD-10-CM | POA: Insufficient documentation

## 2014-10-07 DIAGNOSIS — R079 Chest pain, unspecified: Secondary | ICD-10-CM | POA: Diagnosis not present

## 2014-10-07 DIAGNOSIS — J439 Emphysema, unspecified: Secondary | ICD-10-CM | POA: Diagnosis not present

## 2014-10-07 DIAGNOSIS — Z86718 Personal history of other venous thrombosis and embolism: Secondary | ICD-10-CM | POA: Insufficient documentation

## 2014-10-07 DIAGNOSIS — Z8739 Personal history of other diseases of the musculoskeletal system and connective tissue: Secondary | ICD-10-CM | POA: Insufficient documentation

## 2014-10-07 DIAGNOSIS — Z7901 Long term (current) use of anticoagulants: Secondary | ICD-10-CM | POA: Diagnosis not present

## 2014-10-07 DIAGNOSIS — R0789 Other chest pain: Secondary | ICD-10-CM | POA: Insufficient documentation

## 2014-10-07 DIAGNOSIS — Z7952 Long term (current) use of systemic steroids: Secondary | ICD-10-CM | POA: Diagnosis not present

## 2014-10-07 DIAGNOSIS — I10 Essential (primary) hypertension: Secondary | ICD-10-CM | POA: Diagnosis not present

## 2014-10-07 DIAGNOSIS — J9811 Atelectasis: Secondary | ICD-10-CM | POA: Diagnosis not present

## 2014-10-07 DIAGNOSIS — Z87891 Personal history of nicotine dependence: Secondary | ICD-10-CM | POA: Diagnosis not present

## 2014-10-07 LAB — PROTIME-INR
INR: 3.04 — ABNORMAL HIGH (ref 0.00–1.49)
Prothrombin Time: 31.7 seconds — ABNORMAL HIGH (ref 11.6–15.2)

## 2014-10-07 LAB — BASIC METABOLIC PANEL
ANION GAP: 5 (ref 5–15)
BUN: 19 mg/dL (ref 6–23)
CHLORIDE: 106 meq/L (ref 96–112)
CO2: 25 mmol/L (ref 19–32)
CREATININE: 0.73 mg/dL (ref 0.50–1.35)
Calcium: 9.2 mg/dL (ref 8.4–10.5)
GFR calc Af Amer: >90 mL/min (ref >90)
GFR calc non Af Amer: 89 mL/min — ABNORMAL LOW (ref >90)
GLUCOSE: 110 mg/dL — AB (ref 70–99)
Potassium: 3.9 mmol/L (ref 3.5–5.1)
Sodium: 136 mmol/L (ref 135–145)

## 2014-10-07 LAB — CBC
HCT: 41.9 % (ref 39.0–52.0)
Hemoglobin: 13.9 g/dL (ref 13.0–17.0)
MCH: 29.7 pg (ref 26.0–34.0)
MCHC: 33.2 g/dL (ref 30.0–36.0)
MCV: 89.5 fL (ref 78.0–100.0)
Platelets: 150 10*3/uL (ref 150–400)
RBC: 4.68 MIL/uL (ref 4.22–5.81)
RDW: 14.7 % (ref 11.5–15.5)
WBC: 7.8 10*3/uL (ref 4.0–10.5)

## 2014-10-07 LAB — TROPONIN I: Troponin I: <0.03 ng/mL (ref <0.031)

## 2014-10-07 MED ORDER — SODIUM CHLORIDE 0.9 % IV SOLN
INTRAVENOUS | Status: DC
  Administered 2014-10-07: 13:00:00 via INTRAVENOUS

## 2014-10-07 MED ORDER — HYDROCODONE-ACETAMINOPHEN 5-325 MG PO TABS: 1.0000 | ORAL_TABLET | Freq: Four times a day (QID) | ORAL | Status: DC | PRN

## 2014-10-07 MED ORDER — IOHEXOL 350 MG/ML SOLN
100.0000 mL | Freq: Once | INTRAVENOUS | Status: AC | PRN
  Administered 2014-10-07: 100 mL via INTRAVENOUS

## 2014-10-07 NOTE — ED Notes (Signed)
C/o pain to right chest for one week.  Pt says pain increases when turning in bed or moving suddenly or with cough.  Rates pain 2.  History of DVT to left leg.  Pt currently on Coumadin 5 mg daily.  Last INR was Monday and was 2.7.

## 2014-10-07 NOTE — ED Notes (Signed)
Patient reports CP x 1 week, right sided; worse with movement or deep breaths

## 2014-10-07 NOTE — ED Notes (Signed)
MD at bedside. 

## 2014-10-07 NOTE — ED Provider Notes (Signed)
This chart was scribed for Jose Maw Jayra Choyce, DO by Tonye Royalty, ED Scribe. This patient was seen in room [facility]18/[facility]18   TIME SEEN: 11:23 AM  CHIEF COMPLAINT: chest pain  HPI: Jose Bush is a 74 y.o. male with history of lupus, hypertension, left lower extremity DVT on Coumadin who presents to the Emergency Department complaining of right-sided chest pain with onset 5 days ago. He notes prior DVT to his left leg for which he takes warfarin; he denies prior PE. He describes the pain as aching and states it feels similar to prior tendonitis to his neck. He states pain becomes sharper with movement of his right arm, palpation, and cough, but states it is not painful while he is at rest at this time. He denies pain with deep breath or exertion. He states his cough is not productive. He denies SOB, nausea, vomiting, diaphoresis, dizziness, or fever.  ROS: See HPI Constitutional: no fever, diaphoresis Eyes: no drainage  ENT: no runny nose   Cardiovascular:  positive chest pain  Resp: no SOB, positive cough GI: no nausea, vomiting GU: no dysuria Integumentary: no rash  Allergy: no hives  Musculoskeletal: no leg swelling  Neurological: no slurred speech, dizziness ROS otherwise negative  PAST MEDICAL HISTORY/PAST SURGICAL HISTORY:  Past Medical History  Diagnosis Date  . HTN (hypertension)   . Chest pain, unspecified   . DVT (deep venous thrombosis)   . Lupus     MEDICATIONS:  Prior to Admission medications   Medication Sig Start Date End Date Taking? Authorizing Provider  Calcium Carbonate-Vitamin D (CALCIUM + D PO) Take 1 tablet by mouth every morning.   Yes Historical Provider, MD  hydroxychloroquine (PLAQUENIL) 200 MG tablet Take 1 tablet by mouth 2 (two) times daily. 09/27/14  Yes Historical Provider, MD  lisinopril-hydrochlorothiazide (PRINZIDE,ZESTORETIC) 10-12.5 MG per tablet Take 1 tablet by mouth every morning.   Yes Historical Provider, MD  Multiple Vitamin (MULTIVITAMIN WITH  MINERALS) TABS Take 1 tablet by mouth every morning.   Yes Historical Provider, MD  nitroGLYCERIN (NITROSTAT) 0.4 MG SL tablet Place 0.4 mg under the tongue every 5 (five) minutes as needed for chest pain. x3 doses as needed for chest pain   Yes Historical Provider, MD  omeprazole (PRILOSEC) 20 MG capsule Take 20 mg by mouth every morning.   Yes Historical Provider, MD  predniSONE (DELTASONE) 10 MG tablet Take 1 tablet by mouth 2 (two) times daily. 09/21/14  Yes Historical Provider, MD  tamsulosin (FLOMAX) 0.4 MG CAPS capsule Take 1 capsule by mouth daily. 09/22/14  Yes Historical Provider, MD  traMADol (ULTRAM) 50 MG tablet Take 1 tablet by mouth 2 (two) times daily as needed. 09/23/14  Yes Historical Provider, MD  warfarin (COUMADIN) 5 MG tablet Take 5 mg by mouth daily.  08/30/14  Yes Historical Provider, MD    ALLERGIES:  No Known Allergies  SOCIAL HISTORY:  History  Substance Use Topics  . Smoking status: Former Smoker    Types: Cigarettes    Quit date: 02/02/1979  . Smokeless tobacco: Never Used  . Alcohol Use: No    FAMILY HISTORY: Family History  Problem Relation Age of Onset  . Hypertension Other   . Stroke Mother     EXAM: BP 149/75 mmHg  Pulse 62  Resp 19  SpO2 99% CONSTITUTIONAL: Alert and oriented and responds appropriately to questions. Well-appearing; well-nourished HEAD: Normocephalic EYES: Conjunctivae clear, PERRL ENT: normal nose; no rhinorrhea; moist mucous membranes; pharynx without lesions noted NECK:  Supple, no meningismus, no LAD  CARD: RRR; S1 and S2 appreciated; no murmurs, no clicks, no rubs, no gallops; Mild tenderness to palpation over anterior lateral right chest wall at nipple line; no erythema, ecchymosis, crepitus, or deformity RESP: Normal chest excursion without splinting or tachypnea; breath sounds clear and equal bilaterally; no wheezes, no rhonchi, no rales,  ABD/GI: Normal bowel sounds; non-distended; soft, non-tender, no rebound, no  guarding BACK:  The back appears normal and is non-tender to palpation, there is no CVA tenderness EXT: Normal ROM in all joints; non-tender to palpation; no edema; normal capillary refill; no cyanosis; no calf tenderness or swelling   SKIN: Normal color for age and race; warm NEURO: Moves all extremities equally PSYCH: The patient's mood and manner are appropriate. Grooming and personal hygiene are appropriate.  MEDICAL DECISION MAKING: Patient here with right-sided chest pain. States he was concerned he may have a pulmonary embolus. No signs or symptoms of infection. He does have lupus and is on Plaquenil. Will obtain labs, CT of his chest. Patient declines pain medicine at this time.  ED PROGRESS: Patient's labs are unremarkable. Troponin negative.  EKG nonischemic. INR is therapeutic at 3.04. CT of his chest shows no pulmonary embolus, infiltrate or edema. Likely musculoskeletal chest wall pain. We'll discharge with prescription for Vicodin. Discussed return precautions and supportive care instructions. Patient verbalizes understanding and is comfortable with plan.     EKG Interpretation  Date/Time:  Thursday October 07 2014 10:39:38 EST Ventricular Rate:  60 PR Interval:  207 QRS Duration: 90 QT Interval:  407 QTC Calculation: 407 R Axis:   28 Text Interpretation:  Sinus rhythm RSR' in V1 or V2, probably normal variant Borderline ST elevation, lateral leads Baseline wander in lead(s) V1 No significant change since last tracing Confirmed by Rahmel Nedved,  DO, Johnchristopher Sarvis [phone]([id number]) on 10/07/2014 2:37:16 PM        I personally performed the services described in this documentation, which was scribed in my presence. The recorded information has been reviewed and is accurate.    Jose MawKristen N Kayly Kriegel, DO 10/07/14 1501

## 2014-10-07 NOTE — Discharge Instructions (Signed)

## 2014-11-01 DIAGNOSIS — M1712 Unilateral primary osteoarthritis, left knee: Secondary | ICD-10-CM | POA: Diagnosis not present

## 2014-11-11 ENCOUNTER — Ambulatory Visit: Payer: Self-pay | Admitting: Orthopedic Surgery

## 2014-11-11 NOTE — Progress Notes (Signed)
Preoperative surgical orders have been place into the Epic hospital system for Clearnce HastenWilliam L Dobransky on 11/11/2014, 12:39 PM  by Patrica DuelPERKINS, Jasmeen Fritsch for surgery on 12-13-2014.  Preop Total Knee orders including Experal, IV Tylenol, and IV Decadron as long as there are no contraindications to the above medications. Avel Peacerew Braeden Dolinski, PA-C

## 2014-11-17 DIAGNOSIS — I10 Essential (primary) hypertension: Secondary | ICD-10-CM | POA: Diagnosis not present

## 2014-11-17 DIAGNOSIS — M1990 Unspecified osteoarthritis, unspecified site: Secondary | ICD-10-CM | POA: Diagnosis not present

## 2014-11-17 DIAGNOSIS — I82499 Acute embolism and thrombosis of other specified deep vein of unspecified lower extremity: Secondary | ICD-10-CM | POA: Diagnosis not present

## 2014-11-19 DIAGNOSIS — M3210 Systemic lupus erythematosus, organ or system involvement unspecified: Secondary | ICD-10-CM | POA: Diagnosis not present

## 2014-11-19 DIAGNOSIS — H3531 Nonexudative age-related macular degeneration: Secondary | ICD-10-CM | POA: Diagnosis not present

## 2014-12-06 NOTE — Progress Notes (Signed)
Ct angio 1/16 epic ekg 1/16 epic Clearance Dr Dimas AguasHoward with note 2/16 chart Clearance Dr Kellie Simmeringruslow with note 12/15 on chart

## 2014-12-06 NOTE — Patient Instructions (Addendum)
Your procedure is scheduled on:  12/13/14  MONDAY  Report to [facility]-- MAIN ENTRANCE- FOLLOW SIGNS TO SHORT STAY CENTER Short Stay Center at   0940    AM.   Call this number if you have problems the morning of surgery: [phone]        Do not eat food  after Midnight. Sunday NIGHT--- MAY HAVE CLEAR LIQUIDS Monday MORNING UNTIL 0640 AM-- THEN NOTHING BY MOUTH   Take these medicines the morning of surgery with A SIP OF WATER:Omeprazole May take Nitroglycerin/ Tramadol if needed  .  Contacts, dentures or partial plates, or metal hairpins  can not be worn to surgery. Your family will be responsible for glasses, dentures, hearing aides while you are in surgery  Leave suitcase in the car. After surgery it may be brought to your room.  For patients admitted to the hospital, checkout time is 11:00 AM day of  discharge.         [facility] IS NOT RESPONSIBLE FOR ANY VALUABLES  Patients discharged the day of surgery will not be allowed to drive home. IF going home the day of surgery, you must have a driver and someone to stay with you for the first 24 hours                                                                  [facility] - Preparing for Surgery Before surgery, you can play an important role.  Because skin is not sterile, your skin needs to be as free of germs as possible.  You can reduce the number of germs on your skin by washing with CHG (chlorahexidine gluconate) soap before surgery.  CHG is an antiseptic cleaner which kills germs and bonds with the skin to continue killing germs even after washing. Please DO NOT use if you have an allergy to CHG or antibacterial soaps.  If your skin becomes reddened/irritated stop using the CHG and inform your nurse when you arrive at Short Stay. Do not shave (including legs and underarms) for at least 48 hours prior to the first CHG shower.  You may shave your face/neck. Please follow these instructions carefully:  1.  Shower with  CHG Soap the night before surgery and the  morning of Surgery.  2.  If you choose to wash your hair, wash your hair first as usual with your  normal  shampoo.  3.  After you shampoo, rinse your hair and body thoroughly to remove the  shampoo.                           4.  Use CHG as you would any other liquid soap.  You can apply chg directly  to the skin and wash                       Gently with a scrungie or clean washcloth.  5.  Apply the CHG Soap to your body ONLY FROM THE NECK DOWN.   Do not use on face/ open  Wound or open sores. Avoid contact with eyes, ears mouth and genitals (private parts).                       Wash face,  Genitals (private parts) with your normal soap.             6.  Wash thoroughly, paying special attention to the area where your surgery  will be performed.  7.  Thoroughly rinse your body with warm water from the neck down.  8.  DO NOT shower/wash with your normal soap after using and rinsing off  the CHG Soap.                9.  Pat yourself dry with a clean towel.            10.  Wear clean pajamas.            11.  Place clean sheets on your bed the night of your first shower and do not  sleep with pets. Day of Surgery : Do not apply any lotions/deodorants the morning of surgery.  Please wear clean clothes to the hospital/surgery center.  FAILURE TO FOLLOW THESE INSTRUCTIONS MAY RESULT IN THE CANCELLATION OF YOUR SURGERY PATIENT SIGNATURE_________________________________  NURSE SIGNATURE__________________________________  ________________________________________________________________________    CLEAR LIQUID DIET   Foods Allowed                                                                     Foods Excluded  Coffee and tea, regular and decaf                             liquids that you cannot  Plain Jell-O in any flavor                                             see through such as: Fruit ices (not with fruit pulp)                                      milk, soups, orange juice  Iced Popsicles                                    All solid food Carbonated beverages, regular and diet                                    Cranberry, grape and apple juices Sports drinks like Gatorade Lightly seasoned clear broth or consume(fat free) Sugar, honey syrup  Sample Menu Breakfast                                Lunch  Supper Cranberry juice                    Beef broth                            Chicken broth Jell-O                                     Grape juice                           Apple juice Coffee or tea                        Jell-O                                      Popsicle                                                Coffee or tea                        Coffee or tea  _____________________________________________________________________    Incentive Spirometer  An incentive spirometer is a tool that can help keep your lungs clear and active. This tool measures how well you are filling your lungs with each breath. Taking long deep breaths may help reverse or decrease the chance of developing breathing (pulmonary) problems (especially infection) following:  A long period of time when you are unable to move or be active. BEFORE THE PROCEDURE   If the spirometer includes an indicator to show your best effort, your nurse or respiratory therapist will set it to a desired goal.  If possible, sit up straight or lean slightly forward. Try not to slouch.  Hold the incentive spirometer in an upright position. INSTRUCTIONS FOR USE  1. Sit on the edge of your bed if possible, or sit up as far as you can in bed or on a chair. 2. Hold the incentive spirometer in an upright position. 3. Breathe out normally. 4. Place the mouthpiece in your mouth and seal your lips tightly around it. 5. Breathe in slowly and as deeply as possible, raising the piston or the ball toward the top of  the column. 6. Hold your breath for 3-5 seconds or for as long as possible. Allow the piston or ball to fall to the bottom of the column. 7. Remove the mouthpiece from your mouth and breathe out normally. 8. Rest for a few seconds and repeat Steps 1 through 7 at least 10 times every 1-2 hours when you are awake. Take your time and take a few normal breaths between deep breaths. 9. The spirometer may include an indicator to show your best effort. Use the indicator as a goal to work toward during each repetition. 10. After each set of 10 deep breaths, practice coughing to be sure your lungs are clear. If you have an incision (the cut made at the time of surgery), support your incision when coughing by placing a pillow or rolled up towels firmly against  it. Once you are able to get out of bed, walk around indoors and cough well. You may stop using the incentive spirometer when instructed by your caregiver.  RISKS AND COMPLICATIONS  Take your time so you do not get dizzy or light-headed.  If you are in pain, you may need to take or ask for pain medication before doing incentive spirometry. It is harder to take a deep breath if you are having pain. AFTER USE  Rest and breathe slowly and easily.  It can be helpful to keep track of a log of your progress. Your caregiver can provide you with a simple table to help with this. If you are using the spirometer at home, follow these instructions: SEEK MEDICAL CARE IF:   You are having difficultly using the spirometer.  You have trouble using the spirometer as often as instructed.  Your pain medication is not giving enough relief while using the spirometer.  You develop fever of 100.5 F (38.1 C) or higher. SEEK IMMEDIATE MEDICAL CARE IF:   You cough up bloody sputum that had not been present before.  You develop fever of 102 F (38.9 C) or greater.  You develop worsening pain at or near the incision site. MAKE SURE YOU:   Understand these  instructions.  Will watch your condition.  Will get help right away if you are not doing well or get worse. Document Released: 01/28/2007 Document Revised: 12/10/2011 Document Reviewed: 03/31/2007 [employer] Patient Information 2014 [employer]   ________________________________________________________________________ [facility] Before surgery, you can play an important role.  Because skin is not sterile, your skin needs to be as free of germs as possible.  You can reduce the number of germs on your skin by washing with CHG (chlorahexidine gluconate) soap before surgery.  CHG is an antiseptic cleaner which kills germs and bonds with the skin to continue killing germs even after washing. Please DO NOT use if you have an allergy to CHG or antibacterial soaps.  If your skin becomes reddened/irritated stop using the CHG and inform your nurse when you arrive at Short Stay. Do not shave (including legs and underarms) for at least 48 hours prior to the first CHG shower.  You may shave your face/neck. Please follow these instructions carefully:  1.  Shower with CHG Soap the night before surgery and the  morning of Surgery.  2.  If you choose to wash your hair, wash your hair first as usual with your  normal  shampoo.  3.  After you shampoo, rinse your hair and body thoroughly to remove the  shampoo.                           4.  Use CHG as you would any other liquid soap.  You can apply chg directly  to the skin and wash                       Gently with a scrungie or clean washcloth.  5.  Apply the CHG Soap to your body ONLY FROM THE NECK DOWN.   Do not use on face/ open                           Wound or open sores. Avoid contact with eyes, ears mouth and genitals (private parts).  Wash face,  Genitals (private parts) with your normal soap.             6.  Wash thoroughly, paying special attention to the area where your surgery  will be performed.  7.   Thoroughly rinse your body with warm water from the neck down.  8.  DO NOT shower/wash with your normal soap after using and rinsing off  the CHG Soap.                9.  Pat yourself dry with a clean towel.            10.  Wear clean pajamas.            11.  Place clean sheets on your bed the night of your first shower and do not  sleep with pets. Day of Surgery : Do not apply any lotions/deodorants the morning of surgery.  Please wear clean clothes to the hospital/surgery center.  FAILURE TO FOLLOW THESE INSTRUCTIONS MAY RESULT IN THE CANCELLATION OF YOUR SURGERY PATIENT SIGNATURE_________________________________  NURSE SIGNATURE__________________________________  ________________________________________________________________________

## 2014-12-07 ENCOUNTER — Encounter (INDEPENDENT_AMBULATORY_CARE_PROVIDER_SITE_OTHER): Payer: Self-pay

## 2014-12-07 ENCOUNTER — Encounter (HOSPITAL_COMMUNITY)
Admission: RE | Admit: 2014-12-07 | Discharge: 2014-12-07 | Disposition: A | Discharge status: Home / Self Care | Payer: Commercial Managed Care - HMO | Source: Ambulatory Visit | Attending: Orthopedic Surgery | Admitting: Orthopedic Surgery

## 2014-12-07 ENCOUNTER — Encounter (HOSPITAL_COMMUNITY): Payer: Self-pay

## 2014-12-07 DIAGNOSIS — Z01818 Encounter for other preprocedural examination: Secondary | ICD-10-CM | POA: Insufficient documentation

## 2014-12-07 DIAGNOSIS — M179 Osteoarthritis of knee, unspecified: Secondary | ICD-10-CM | POA: Insufficient documentation

## 2014-12-07 HISTORY — DX: Gastro-esophageal reflux disease without esophagitis: K21.9

## 2014-12-07 HISTORY — DX: Unspecified osteoarthritis, unspecified site: M19.90

## 2014-12-07 HISTORY — DX: Cerebral infarction, unspecified: I63.9

## 2014-12-07 LAB — CBC
HEMATOCRIT: 39 % (ref 39.0–52.0)
Hemoglobin: 13.1 g/dL (ref 13.0–17.0)
MCH: 29.9 pg (ref 26.0–34.0)
MCHC: 33.6 g/dL (ref 30.0–36.0)
MCV: 89 fL (ref 78.0–100.0)
PLATELETS: 148 10*3/uL — AB (ref 150–400)
RBC: 4.38 MIL/uL (ref 4.22–5.81)
RDW: 14.3 % (ref 11.5–15.5)
WBC: 5.4 10*3/uL (ref 4.0–10.5)

## 2014-12-07 LAB — COMPREHENSIVE METABOLIC PANEL
ALBUMIN: 4.1 g/dL (ref 3.5–5.2)
ALT: 33 U/L (ref 0–53)
ANION GAP: 4 — AB (ref 5–15)
AST: 34 U/L (ref 0–37)
Alkaline Phosphatase: 59 U/L (ref 39–117)
BILIRUBIN TOTAL: 0.5 mg/dL (ref 0.3–1.2)
BUN: 15 mg/dL (ref 6–23)
CO2: 26 mmol/L (ref 19–32)
CREATININE: 0.71 mg/dL (ref 0.50–1.35)
Calcium: 9.1 mg/dL (ref 8.4–10.5)
Chloride: 108 mmol/L (ref 96–112)
GFR calc Af Amer: >90 mL/min (ref >90)
GFR calc non Af Amer: >90 mL/min (ref >90)
GLUCOSE: 95 mg/dL (ref 70–99)
Potassium: 4.1 mmol/L (ref 3.5–5.1)
SODIUM: 138 mmol/L (ref 135–145)
Total Protein: 6.9 g/dL (ref 6.0–8.3)

## 2014-12-07 LAB — SURGICAL PCR SCREEN
MRSA, PCR: NEGATIVE
Staphylococcus aureus: NEGATIVE

## 2014-12-07 LAB — PROTIME-INR
INR: 2.3 — ABNORMAL HIGH (ref 0.00–1.49)
Prothrombin Time: 25.5 seconds — ABNORMAL HIGH (ref 11.6–15.2)

## 2014-12-07 LAB — URINALYSIS, ROUTINE W REFLEX MICROSCOPIC
BILIRUBIN URINE: NEGATIVE
Glucose, UA: NEGATIVE mg/dL
HGB URINE DIPSTICK: NEGATIVE
Ketones, ur: NEGATIVE mg/dL
Leukocytes, UA: NEGATIVE
NITRITE: NEGATIVE
PROTEIN: NEGATIVE mg/dL
SPECIFIC GRAVITY, URINE: 1.012 (ref 1.005–1.030)
UROBILINOGEN UA: 0.2 mg/dL (ref 0.0–1.0)
pH: 6 (ref 5.0–8.0)

## 2014-12-07 LAB — APTT: APTT: 41 s — AB (ref 24–37)

## 2014-12-07 NOTE — Progress Notes (Signed)
States was given instructions regarding last dose of coumadin by Dr Aluisio/ Kenard Gowerrew.  To stop all meds but prolisec and bp meds after today

## 2014-12-07 NOTE — Progress Notes (Signed)
   12/07/14 1040  OBSTRUCTIVE SLEEP APNEA  Have you ever been diagnosed with sleep apnea through a sleep study? No  Do you snore loudly (loud enough to be heard through closed doors)?  0  Do you often feel tired, fatigued, or sleepy during the daytime? 0  Has anyone observed you stop breathing during your sleep? 0  Do you have, or are you being treated for high blood pressure? 1  BMI more than 35 kg/m2? 0  Age over 75 years old? 1  Neck circumference greater than 40 cm/16 inches? 1  Gender: 1

## 2014-12-08 LAB — ABO/RH: ABO/RH(D): O POS

## 2014-12-12 ENCOUNTER — Ambulatory Visit: Payer: Self-pay | Admitting: Orthopedic Surgery

## 2014-12-12 NOTE — H&P (Signed)
Jose Bush DOB: October 07, 1940 Married / Language: English / Race: White Male Date of Admission:  12/13/2014 CC:  Left Knee Pain History of Present Illness (Alexzandrew L. Perkins III PA-C; 11/28/2014 5:16 PM) The patient is a 74 year old male who comes in for a preoperative History and Physical. The patient is scheduled for a left total knee arthroplasty to be performed by Dr. Gus Rankin. Aluisio, MD at [facility] on 12-13-2014. The patient is a 74 year old male who presents for follow up of their knee. The patient is being followed for their left (worse than right) knee pain and osteoarthritis. They are now several months out from Euflexxa series in the right knee. Symptoms reported today include: pain and aching. The patient feels that they are doing poorly and report their pain level to be moderate to severe. Current treatment includes: pain medications (tramadol and Tylenol). The patient has not gotten any relief of their symptoms with viscosupplementation. He is ready to proceed with knee replacement and would like to start with the left knee. Unfortunately, both knees are getting worse. The Visco supplement injections in the right knee did not help. He wants to get the right knee fixed also after he gets the left one done. They have been treated conservatively in the past for the above stated problem and despite conservative measures, they continue to have progressive pain and severe functional limitations and dysfunction. They have failed non-operative management including home exercise, medications, and injections. It is felt that they would benefit from undergoing total joint replacement. Risks and benefits of the procedure have been discussed with the patient and they elect to proceed with surgery. There are no active contraindications to surgery such as ongoing infection or rapidly progressive neurological disease.   Problem List/Past Medical (Alexzandrew Tessie Fass, III PA-C;  11/28/2014 5:17 PM) Lupus (M32.9) Primary osteoarthritis of left knee (M17.12) Gastroesophageal Reflux Disease Hiatal Hernia Heart murmur High blood pressure Blood Clot DVT Left Leg Cerebrovascular Accident Ulcer disease Lupus Anticoagulant Disorder Tinnitus Shingles  Allergies  No Known Drug Allergies  Family History (Alexzandrew L Perkins, III PA-C; 11/19/2014 3:54 PM) Cerebrovascular Accident Mother. Congestive Heart Failure Father.  Social History (Alexzandrew Tessie Fass, III PA-C; 11/28/2014 5:12 PM) Tobacco use Former smoker. 11/23/2013: smoke(d) 1/2 pack(s) per day Current work status retired Biochemist, clinical weekly; does running / walking and other Children 2 Former drinker 11/23/2013: In the past drank Not under pain contract Tobacco / smoke exposure 11/23/2013: no No history of drug/alcohol rehab Living situation live with spouse Marital status married Advance Directives Living Will, Healthcare POA  Medication History (Alexzandrew Tessie Fass, III PA-C; 11/19/2014 3:54 PM) Tylenol (  Capsule, Oral) Active. Flomax (0.4MG  Capsule, Oral) Active. TraMADol HCl (  Tablet, Oral) Active. Lisinopril-Hydrochlorothiazide (10-12.5MG  Tablet, Oral) Active. Omeprazole (  Capsule DR, Oral) Active. Warfarin Sodium (  Tablet, Oral) Active. Aspirin Adult Low Strength (  Tablet Chewable, Oral) Active. Calcium "900" w/D (Oral) Active. Nasal Allergy Control (5.2MG /ACT Aerosol Soln, Nasal as needed) Active. Hydroxychloroquine Sulfate (  Tablet, Oral) Active. Saline (as needed) Active.  Past Surgical History (Alexzandrew Tessie Fass, III PA-C; 11/19/2014 3:56 PM) Arthroscopy of Knee Date: 2007. right Inguinal Hernia Repair Date: 2010. laparoscopic: right Vena Cava Filter Placement Date: 01/2013.   Review of Systems (Alexzandrew L. Perkins III PA-C; 11/19/2014 3:54 PM) General Not Present- Chills, Fatigue, Fever, Memory Loss,  Night Sweats, Weight Gain and Weight Loss. Skin Not Present- Eczema, Hives, Itching, Lesions and Rash. HEENT Not Present- Dentures, Double Vision, Headache,  Hearing Loss, Tinnitus and Visual Loss. Respiratory Not Present- Allergies, Chronic Cough, Coughing up blood, Shortness of breath at rest and Shortness of breath with exertion. Cardiovascular Not Present- Chest Pain, Difficulty Breathing Lying Down, Murmur, Palpitations, Racing/skipping heartbeats and Swelling. Gastrointestinal Not Present- Abdominal Pain, Bloody Stool, Constipation, Diarrhea, Difficulty Swallowing, Heartburn, Jaundice, Loss of appetitie, Nausea and Vomiting. Male Genitourinary Not Present- Blood in Urine, Discharge, Flank Pain, Incontinence, Painful Urination, Urgency, Urinary frequency, Urinary Retention, Urinating at Night and Weak urinary stream. Musculoskeletal Present- Joint Pain. Not Present- Back Pain, Joint Swelling, Morning Stiffness, Muscle Pain, Muscle Weakness and Spasms. Neurological Not Present- Blackout spells, Difficulty with balance, Dizziness, Paralysis, Tremor and Weakness. Psychiatric Not Present- Insomnia.   Vitals (Alexzandrew L. Perkins III PA-C; 11/19/2014 4:00 PM) 11/19/2014 3:59 PM Weight: 205 lb Height: 69in Body Surface Area: 2.09 m Body Mass Index: 30.27 kg/m  BP: 122/64 (Sitting, Right Arm, Standard)   Physical Exam (Alexzandrew L. Perkins III PA-C; 11/28/2014 5:13 PM) General Mental Status -Alert, cooperative and good historian. General Appearance-pleasant, Not in acute distress. Orientation-Oriented X3. Build & Nutrition-Well nourished and Well developed.  Head and Neck Head-normocephalic, atraumatic . Neck Global Assessment - supple, no bruit auscultated on the right, no bruit auscultated on the left. Note: upper and lower denture plates   Eye Vision-Wears corrective lenses. Pupil - Bilateral-Regular and Round. Motion - Bilateral-EOMI.  Chest and Lung  Exam Auscultation Breath sounds - clear at anterior chest wall and clear at posterior chest wall. Adventitious sounds - No Adventitious sounds.  Cardiovascular Auscultation Rhythm - Regular rate and rhythm. Heart Sounds - S1 WNL and S2 WNL. Murmurs & Other Heart Sounds - Auscultation of the heart reveals - No Murmurs.  Abdomen Palpation/Percussion Tenderness - Abdomen is non-tender to palpation. Rigidity (guarding) - Abdomen is soft. Auscultation Auscultation of the abdomen reveals - Bowel sounds normal.  Male Genitourinary Note: Not done, not pertinent to present illness   Musculoskeletal Note: On exam he is alert and oriented in no apparent distress. Both knees show no effusion. There is marked crepitus on range of motion of both knees. Range being about 5 to 115. There is no instability noted.   Assessment & Plan (Alexzandrew L. Perkins III PA-C; 11/28/2014 5:14 PM) Primary osteoarthritis of left knee (M17.12) Primary osteoarthritis of right knee (M17.11) Note:Surgical Plans: Left Total Knee Repalcement  Disposition: Home  PCP: Dr. Selinda FlavinKevin Howard - Patient has been seen preoperatively and felt to be stable for surgery.  Topical TXA - Hisotry of Blood Clot  Anesthesia Issues: None  Signed electronically by Lauraine RinneAlexzandrew L Perkins, III PA-C

## 2014-12-13 ENCOUNTER — Encounter (HOSPITAL_COMMUNITY)
Admission: RE | Disposition: A | Discharge status: Home Health | Payer: Self-pay | Source: Ambulatory Visit | Attending: Orthopedic Surgery

## 2014-12-13 ENCOUNTER — Inpatient Hospital Stay (HOSPITAL_COMMUNITY)
Admission: RE | Admit: 2014-12-13 | Discharge: 2014-12-15 | DRG: 470 | Disposition: A | Discharge status: Home Health | Payer: Commercial Managed Care - HMO | Source: Ambulatory Visit | Attending: Orthopedic Surgery | Admitting: Orthopedic Surgery

## 2014-12-13 ENCOUNTER — Inpatient Hospital Stay (HOSPITAL_COMMUNITY): Payer: Commercial Managed Care - HMO | Admitting: Anesthesiology

## 2014-12-13 ENCOUNTER — Encounter (HOSPITAL_COMMUNITY): Payer: Self-pay | Admitting: *Deleted

## 2014-12-13 DIAGNOSIS — Z86718 Personal history of other venous thrombosis and embolism: Secondary | ICD-10-CM | POA: Diagnosis not present

## 2014-12-13 DIAGNOSIS — M25562 Pain in left knee: Secondary | ICD-10-CM | POA: Diagnosis present

## 2014-12-13 DIAGNOSIS — Z7901 Long term (current) use of anticoagulants: Secondary | ICD-10-CM | POA: Diagnosis not present

## 2014-12-13 DIAGNOSIS — Z7982 Long term (current) use of aspirin: Secondary | ICD-10-CM | POA: Diagnosis not present

## 2014-12-13 DIAGNOSIS — M329 Systemic lupus erythematosus, unspecified: Secondary | ICD-10-CM | POA: Diagnosis not present

## 2014-12-13 DIAGNOSIS — I10 Essential (primary) hypertension: Secondary | ICD-10-CM | POA: Diagnosis present

## 2014-12-13 DIAGNOSIS — Z8673 Personal history of transient ischemic attack (TIA), and cerebral infarction without residual deficits: Secondary | ICD-10-CM | POA: Diagnosis not present

## 2014-12-13 DIAGNOSIS — K219 Gastro-esophageal reflux disease without esophagitis: Secondary | ICD-10-CM | POA: Diagnosis not present

## 2014-12-13 DIAGNOSIS — M179 Osteoarthritis of knee, unspecified: Secondary | ICD-10-CM | POA: Diagnosis present

## 2014-12-13 DIAGNOSIS — Z87891 Personal history of nicotine dependence: Secondary | ICD-10-CM | POA: Diagnosis not present

## 2014-12-13 DIAGNOSIS — M1710 Unilateral primary osteoarthritis, unspecified knee: Secondary | ICD-10-CM | POA: Diagnosis present

## 2014-12-13 DIAGNOSIS — M1712 Unilateral primary osteoarthritis, left knee: Secondary | ICD-10-CM | POA: Diagnosis not present

## 2014-12-13 DIAGNOSIS — M170 Bilateral primary osteoarthritis of knee: Secondary | ICD-10-CM | POA: Diagnosis not present

## 2014-12-13 HISTORY — PX: TOTAL KNEE ARTHROPLASTY: SHX125

## 2014-12-13 LAB — PROTIME-INR
INR: 1.07 (ref 0.00–1.49)
Prothrombin Time: 14 seconds (ref 11.6–15.2)

## 2014-12-13 LAB — TYPE AND SCREEN
ABO/RH(D): O POS
Antibody Screen: NEGATIVE

## 2014-12-13 SURGERY — ARTHROPLASTY, KNEE, TOTAL
Anesthesia: Spinal | Site: Knee | Laterality: Left

## 2014-12-13 MED ORDER — TAMSULOSIN HCL 0.4 MG PO CAPS
0.4000 mg | ORAL_CAPSULE | Freq: Every morning | ORAL | Status: DC
  Administered 2014-12-14 – 2014-12-15 (×2): 0.4 mg via ORAL
  Filled 2014-12-13 (×2): qty 1

## 2014-12-13 MED ORDER — SODIUM CHLORIDE 0.9 % IJ SOLN
INTRAMUSCULAR | Status: DC | PRN
  Administered 2014-12-13: 30 mL

## 2014-12-13 MED ORDER — SODIUM CHLORIDE 0.9 % IV SOLN
INTRAVENOUS | Status: DC
  Administered 2014-12-13 – 2014-12-14 (×2): via INTRAVENOUS

## 2014-12-13 MED ORDER — DEXTROSE 5 % IV SOLN
500.0000 mg | Freq: Four times a day (QID) | INTRAVENOUS | Status: DC | PRN
  Administered 2014-12-13: 500 mg via INTRAVENOUS
  Filled 2014-12-13 (×2): qty 5

## 2014-12-13 MED ORDER — CEFAZOLIN SODIUM-DEXTROSE 2-3 GM-% IV SOLR
INTRAVENOUS | Status: AC
  Filled 2014-12-13: qty 50

## 2014-12-13 MED ORDER — PROPOFOL 10 MG/ML IV BOLUS
INTRAVENOUS | Status: AC
  Filled 2014-12-13: qty 20

## 2014-12-13 MED ORDER — WARFARIN SODIUM 5 MG PO TABS
5.0000 mg | ORAL_TABLET | Freq: Once | ORAL | Status: AC
  Administered 2014-12-13: 5 mg via ORAL
  Filled 2014-12-13: qty 1

## 2014-12-13 MED ORDER — FENTANYL CITRATE 0.05 MG/ML IJ SOLN
INTRAMUSCULAR | Status: DC | PRN
  Administered 2014-12-13: 50 ug via INTRAVENOUS

## 2014-12-13 MED ORDER — FLEET ENEMA 7-19 GM/118ML RE ENEM: 1.0000 | ENEMA | Freq: Once | RECTAL | Status: AC | PRN

## 2014-12-13 MED ORDER — SODIUM CHLORIDE 0.9 % IV SOLN
INTRAVENOUS | Status: DC
  Administered 2014-12-13 (×3): via INTRAVENOUS

## 2014-12-13 MED ORDER — TRANEXAMIC ACID 100 MG/ML IV SOLN
2000.0000 mg | Freq: Once | INTRAVENOUS | Status: DC
  Filled 2014-12-13: qty 20

## 2014-12-13 MED ORDER — ACETAMINOPHEN 650 MG RE SUPP: 650.0000 mg | Freq: Four times a day (QID) | RECTAL | Status: DC | PRN

## 2014-12-13 MED ORDER — DIPHENHYDRAMINE HCL 12.5 MG/5ML PO ELIX: 12.5000 mg | ORAL_SOLUTION | ORAL | Status: DC | PRN

## 2014-12-13 MED ORDER — TRANEXAMIC ACID 100 MG/ML IV SOLN
2000.0000 mg | INTRAVENOUS | Status: DC | PRN
  Administered 2014-12-13: 2000 mg via TOPICAL

## 2014-12-13 MED ORDER — BUPIVACAINE LIPOSOME 1.3 % IJ SUSP
INTRAMUSCULAR | Status: DC | PRN
  Administered 2014-12-13: 20 mL

## 2014-12-13 MED ORDER — MORPHINE SULFATE 2 MG/ML IJ SOLN
1.0000 mg | INTRAMUSCULAR | Status: DC | PRN
  Administered 2014-12-13 – 2014-12-14 (×2): 2 mg via INTRAVENOUS
  Administered 2014-12-15: 1 mg via INTRAVENOUS
  Filled 2014-12-13 (×4): qty 1

## 2014-12-13 MED ORDER — OXYCODONE HCL 5 MG PO TABS
5.0000 mg | ORAL_TABLET | ORAL | Status: DC | PRN
  Administered 2014-12-13 – 2014-12-15 (×13): 10 mg via ORAL
  Filled 2014-12-13 (×13): qty 2

## 2014-12-13 MED ORDER — FENTANYL CITRATE 0.05 MG/ML IJ SOLN
INTRAMUSCULAR | Status: AC
  Filled 2014-12-13: qty 2

## 2014-12-13 MED ORDER — DEXAMETHASONE SODIUM PHOSPHATE 10 MG/ML IJ SOLN
INTRAMUSCULAR | Status: AC
  Filled 2014-12-13: qty 1

## 2014-12-13 MED ORDER — MIDAZOLAM HCL 5 MG/5ML IJ SOLN
INTRAMUSCULAR | Status: DC | PRN
  Administered 2014-12-13: 2 mg via INTRAVENOUS

## 2014-12-13 MED ORDER — METHOCARBAMOL 500 MG PO TABS
500.0000 mg | ORAL_TABLET | Freq: Four times a day (QID) | ORAL | Status: DC | PRN
  Administered 2014-12-13 – 2014-12-15 (×3): 500 mg via ORAL
  Filled 2014-12-13 (×3): qty 1

## 2014-12-13 MED ORDER — TRAMADOL HCL 50 MG PO TABS
50.0000 mg | ORAL_TABLET | Freq: Four times a day (QID) | ORAL | Status: DC | PRN
  Administered 2014-12-15: 100 mg via ORAL
  Filled 2014-12-13: qty 2

## 2014-12-13 MED ORDER — PANTOPRAZOLE SODIUM 40 MG PO TBEC
40.0000 mg | DELAYED_RELEASE_TABLET | Freq: Every day | ORAL | Status: DC
  Filled 2014-12-13: qty 1

## 2014-12-13 MED ORDER — MEPERIDINE HCL 50 MG/ML IJ SOLN: 6.2500 mg | INTRAMUSCULAR | Status: DC | PRN

## 2014-12-13 MED ORDER — PROPOFOL INFUSION 10 MG/ML OPTIME
INTRAVENOUS | Status: DC | PRN
  Administered 2014-12-13: 140 ug/kg/min via INTRAVENOUS

## 2014-12-13 MED ORDER — BUPIVACAINE LIPOSOME 1.3 % IJ SUSP
20.0000 mL | Freq: Once | INTRAMUSCULAR | Status: DC
Start: 2014-12-13 — End: 2014-12-13
  Filled 2014-12-13: qty 20

## 2014-12-13 MED ORDER — ACETAMINOPHEN 325 MG PO TABS: 650.0000 mg | ORAL_TABLET | Freq: Four times a day (QID) | ORAL | Status: DC | PRN

## 2014-12-13 MED ORDER — POLYETHYLENE GLYCOL 3350 17 G PO PACK
17.0000 g | PACK | Freq: Every day | ORAL | Status: DC | PRN
Start: 2014-12-13 — End: 2014-12-15
  Administered 2014-12-14: 17 g via ORAL
  Filled 2014-12-13: qty 1

## 2014-12-13 MED ORDER — SODIUM CHLORIDE 0.9 % IV SOLN
INTRAVENOUS | Status: DC
  Administered 2014-12-13: 1000 mL via INTRAVENOUS

## 2014-12-13 MED ORDER — BISACODYL 10 MG RE SUPP: 10.0000 mg | Freq: Every day | RECTAL | Status: DC | PRN

## 2014-12-13 MED ORDER — BUPIVACAINE HCL (PF) 0.25 % IJ SOLN
INTRAMUSCULAR | Status: AC
  Filled 2014-12-13: qty 30

## 2014-12-13 MED ORDER — DEXAMETHASONE SODIUM PHOSPHATE 10 MG/ML IJ SOLN
10.0000 mg | Freq: Once | INTRAMUSCULAR | Status: AC
  Administered 2014-12-14: 10 mg via INTRAVENOUS
  Filled 2014-12-13: qty 1

## 2014-12-13 MED ORDER — SODIUM CHLORIDE 0.9 % IJ SOLN
INTRAMUSCULAR | Status: AC
  Filled 2014-12-13: qty 50

## 2014-12-13 MED ORDER — CEFAZOLIN SODIUM-DEXTROSE 2-3 GM-% IV SOLR
2.0000 g | Freq: Four times a day (QID) | INTRAVENOUS | Status: AC
  Administered 2014-12-13 – 2014-12-14 (×2): 2 g via INTRAVENOUS
  Filled 2014-12-13 (×2): qty 50

## 2014-12-13 MED ORDER — ONDANSETRON HCL 4 MG/2ML IJ SOLN: 4.0000 mg | Freq: Four times a day (QID) | INTRAMUSCULAR | Status: DC | PRN

## 2014-12-13 MED ORDER — BUPIVACAINE IN DEXTROSE 0.75-8.25 % IT SOLN
INTRATHECAL | Status: DC | PRN
  Administered 2014-12-13: 1.6 mL via INTRATHECAL

## 2014-12-13 MED ORDER — ONDANSETRON HCL 4 MG PO TABS: 4.0000 mg | ORAL_TABLET | Freq: Four times a day (QID) | ORAL | Status: DC | PRN

## 2014-12-13 MED ORDER — ACETAMINOPHEN 500 MG PO TABS
1000.0000 mg | ORAL_TABLET | Freq: Four times a day (QID) | ORAL | Status: AC
  Administered 2014-12-13 – 2014-12-14 (×4): 1000 mg via ORAL
  Filled 2014-12-13 (×4): qty 2

## 2014-12-13 MED ORDER — DOCUSATE SODIUM 100 MG PO CAPS
100.0000 mg | ORAL_CAPSULE | Freq: Two times a day (BID) | ORAL | Status: DC
  Administered 2014-12-13 – 2014-12-15 (×4): 100 mg via ORAL

## 2014-12-13 MED ORDER — WARFARIN - PHARMACIST DOSING INPATIENT: Freq: Every day | Status: DC

## 2014-12-13 MED ORDER — DEXAMETHASONE SODIUM PHOSPHATE 10 MG/ML IJ SOLN: 10.0000 mg | Freq: Once | INTRAMUSCULAR | Status: DC

## 2014-12-13 MED ORDER — BUPIVACAINE HCL 0.25 % IJ SOLN
INTRAMUSCULAR | Status: DC | PRN
  Administered 2014-12-13: 20 mL

## 2014-12-13 MED ORDER — CEFAZOLIN SODIUM-DEXTROSE 2-3 GM-% IV SOLR
2.0000 g | INTRAVENOUS | Status: AC
  Administered 2014-12-13: 2 g via INTRAVENOUS

## 2014-12-13 MED ORDER — PROMETHAZINE HCL 25 MG/ML IJ SOLN: 6.2500 mg | INTRAMUSCULAR | Status: DC | PRN

## 2014-12-13 MED ORDER — ACETAMINOPHEN 10 MG/ML IV SOLN
1000.0000 mg | Freq: Once | INTRAVENOUS | Status: AC
  Administered 2014-12-13: 1000 mg via INTRAVENOUS
  Filled 2014-12-13: qty 100

## 2014-12-13 MED ORDER — NITROGLYCERIN 0.4 MG SL SUBL: 0.4000 mg | SUBLINGUAL_TABLET | SUBLINGUAL | Status: DC | PRN

## 2014-12-13 MED ORDER — METOCLOPRAMIDE HCL 5 MG/ML IJ SOLN
5.0000 mg | Freq: Three times a day (TID) | INTRAMUSCULAR | Status: DC | PRN
Start: 2014-12-13 — End: 2014-12-15

## 2014-12-13 MED ORDER — ENOXAPARIN SODIUM 30 MG/0.3ML ~~LOC~~ SOLN
30.0000 mg | Freq: Two times a day (BID) | SUBCUTANEOUS | Status: DC
  Administered 2014-12-14 – 2014-12-15 (×3): 30 mg via SUBCUTANEOUS
  Filled 2014-12-13 (×5): qty 0.3

## 2014-12-13 MED ORDER — MENTHOL 3 MG MT LOZG: 1.0000 | LOZENGE | OROMUCOSAL | Status: DC | PRN

## 2014-12-13 MED ORDER — PROPOFOL 10 MG/ML IV BOLUS
INTRAVENOUS | Status: DC | PRN
  Administered 2014-12-13: 30 mg via INTRAVENOUS

## 2014-12-13 MED ORDER — PHENOL 1.4 % MT LIQD
1.0000 | OROMUCOSAL | Status: DC | PRN
  Filled 2014-12-13: qty 177

## 2014-12-13 MED ORDER — METOCLOPRAMIDE HCL 10 MG PO TABS
5.0000 mg | ORAL_TABLET | Freq: Three times a day (TID) | ORAL | Status: DC | PRN
Start: 2014-12-13 — End: 2014-12-15

## 2014-12-13 MED ORDER — SODIUM CHLORIDE 0.9 % IR SOLN
Status: DC | PRN
  Administered 2014-12-13: 1000 mL

## 2014-12-13 MED ORDER — FENTANYL CITRATE 0.05 MG/ML IJ SOLN
25.0000 ug | INTRAMUSCULAR | Status: DC | PRN
  Administered 2014-12-13: 25 ug via INTRAVENOUS
  Administered 2014-12-13: 50 ug via INTRAVENOUS

## 2014-12-13 MED ORDER — MIDAZOLAM HCL 2 MG/2ML IJ SOLN
INTRAMUSCULAR | Status: AC
  Filled 2014-12-13: qty 2

## 2014-12-13 SURGICAL SUPPLY — 66 items
BAG DECANTER FOR FLEXI CONT (MISCELLANEOUS) ×3 IMPLANT
BAG SPEC THK2 15X12 ZIP CLS (MISCELLANEOUS) ×1
BAG ZIPLOCK 12X15 (MISCELLANEOUS) ×3 IMPLANT
BANDAGE ELASTIC 6 VELCRO ST LF (GAUZE/BANDAGES/DRESSINGS) ×3 IMPLANT
BANDAGE ESMARK 6X9 LF (GAUZE/BANDAGES/DRESSINGS) ×1 IMPLANT
BLADE SAG 18X100X1.27 (BLADE) ×3 IMPLANT
BLADE SAW SGTL 11.0X1.19X90.0M (BLADE) ×3 IMPLANT
BNDG CMPR 9X6 STRL LF SNTH (GAUZE/BANDAGES/DRESSINGS) ×1
BNDG ESMARK 6X9 LF (GAUZE/BANDAGES/DRESSINGS) ×3
BOWL SMART MIX CTS (DISPOSABLE) ×3 IMPLANT
CAP KNEE TOTAL 3 SIGMA ×2 IMPLANT
CEMENT HV SMART SET (Cement) ×6 IMPLANT
CLOSURE WOUND 1/2 X4 (GAUZE/BANDAGES/DRESSINGS) ×1
CUFF TOURN SGL QUICK 34 (TOURNIQUET CUFF) ×3
CUFF TRNQT CYL 34X4X40X1 (TOURNIQUET CUFF) ×1 IMPLANT
DECANTER SPIKE VIAL GLASS SM (MISCELLANEOUS) ×3 IMPLANT
DRAPE EXTREMITY T 121X128X90 (DRAPE) ×3 IMPLANT
DRAPE POUCH INSTRU U-SHP 10X18 (DRAPES) ×3 IMPLANT
DRAPE U-SHAPE 47X51 STRL (DRAPES) ×3 IMPLANT
DRSG ADAPTIC 3X8 NADH LF (GAUZE/BANDAGES/DRESSINGS) ×3 IMPLANT
DRSG PAD ABDOMINAL 8X10 ST (GAUZE/BANDAGES/DRESSINGS) IMPLANT
DURAPREP 26ML APPLICATOR (WOUND CARE) ×3 IMPLANT
ELECT REM PT RETURN 9FT ADLT (ELECTROSURGICAL) ×3
ELECTRODE REM PT RTRN 9FT ADLT (ELECTROSURGICAL) ×1 IMPLANT
EVACUATOR 1/8 PVC DRAIN (DRAIN) ×3 IMPLANT
FACESHIELD WRAPAROUND (MASK) ×15 IMPLANT
FACESHIELD WRAPAROUND OR TEAM (MASK) ×5 IMPLANT
GAUZE SPONGE 4X4 12PLY STRL (GAUZE/BANDAGES/DRESSINGS) ×3 IMPLANT
GLOVE BIO SURGEON STRL SZ7.5 (GLOVE) IMPLANT
GLOVE BIO SURGEON STRL SZ8 (GLOVE) ×3 IMPLANT
GLOVE BIOGEL PI IND STRL 6.5 (GLOVE) IMPLANT
GLOVE BIOGEL PI IND STRL 8 (GLOVE) ×1 IMPLANT
GLOVE BIOGEL PI INDICATOR 6.5 (GLOVE)
GLOVE BIOGEL PI INDICATOR 8 (GLOVE) ×2
GLOVE SURG SS PI 6.5 STRL IVOR (GLOVE) IMPLANT
GOWN STRL REUS W/TWL LRG LVL3 (GOWN DISPOSABLE) ×3 IMPLANT
GOWN STRL REUS W/TWL XL LVL3 (GOWN DISPOSABLE) IMPLANT
HANDPIECE INTERPULSE COAX TIP (DISPOSABLE) ×3
IMMOBILIZER KNEE 20 (SOFTGOODS) ×2 IMPLANT
IMMOBILIZER KNEE 20 THIGH 36 (SOFTGOODS) IMPLANT
KIT BASIN OR (CUSTOM PROCEDURE TRAY) ×3 IMPLANT
MANIFOLD NEPTUNE II (INSTRUMENTS) ×3 IMPLANT
NDL SAFETY ECLIPSE 18X1.5 (NEEDLE) ×2 IMPLANT
NEEDLE HYPO 18GX1.5 SHARP (NEEDLE) ×6
NS IRRIG 1000ML POUR BTL (IV SOLUTION) ×3 IMPLANT
PACK TOTAL JOINT (CUSTOM PROCEDURE TRAY) ×3 IMPLANT
PAD ABD 8X10 STRL (GAUZE/BANDAGES/DRESSINGS) ×2 IMPLANT
PADDING CAST COTTON 6X4 STRL (CAST SUPPLIES) ×5 IMPLANT
PEN SKIN MARKING BROAD (MISCELLANEOUS) ×3 IMPLANT
POSITIONER SURGICAL ARM (MISCELLANEOUS) ×3 IMPLANT
SET HNDPC FAN SPRY TIP SCT (DISPOSABLE) ×1 IMPLANT
STRIP CLOSURE SKIN 1/2X4 (GAUZE/BANDAGES/DRESSINGS) ×3 IMPLANT
SUCTION FRAZIER 12FR DISP (SUCTIONS) ×3 IMPLANT
SUT MNCRL AB 4-0 PS2 18 (SUTURE) ×3 IMPLANT
SUT VIC AB 2-0 CT1 27 (SUTURE) ×9
SUT VIC AB 2-0 CT1 TAPERPNT 27 (SUTURE) ×3 IMPLANT
SUT VLOC 180 0 24IN GS25 (SUTURE) ×3 IMPLANT
SYR 20CC LL (SYRINGE) ×3 IMPLANT
SYR 50ML LL SCALE MARK (SYRINGE) ×3 IMPLANT
TOWEL OR 17X26 10 PK STRL BLUE (TOWEL DISPOSABLE) ×3 IMPLANT
TOWEL OR NON WOVEN STRL DISP B (DISPOSABLE) IMPLANT
TRAY FOLEY CATH 14FRSI W/METER (CATHETERS) ×1 IMPLANT
TRAY FOLEY CATH 16FRSI W/METER (SET/KITS/TRAYS/PACK) ×2 IMPLANT
WATER STERILE IRR 1500ML POUR (IV SOLUTION) ×3 IMPLANT
WRAP KNEE MAXI GEL POST OP (GAUZE/BANDAGES/DRESSINGS) ×3 IMPLANT
YANKAUER SUCT BULB TIP 10FT TU (MISCELLANEOUS) ×3 IMPLANT

## 2014-12-13 NOTE — Progress Notes (Addendum)
ANTICOAGULATION CONSULT NOTE - Initial Consult  Pharmacy Consult for warfarin Indication: VTE prophylaxis  No Known Allergies  Patient Measurements: Height:  (172.7 cm) Weight: 200 lb (90.719 kg) IBW/kg (Calculated) : 68.4 Heparin Dosing Weight: n/a  Vital Signs: Temp: 98.3 F (36.8 C) (03/14 2155) Temp Source: Oral (03/14 2155) BP: 111/57 mmHg (03/14 2155) Pulse Rate: 64 (03/14 2155)  Labs:  Recent Labs  12/13/14 0950  LABPROT 14.0  INR 1.07    Estimated Creatinine Clearance: 88.6 mL/min (by C-G formula based on Cr of 0.71).   Medical History: Past Medical History  Diagnosis Date  . HTN (hypertension)   . Chest pain, unspecified   . DVT (deep venous thrombosis)   . Lupus   . Stroke 2010    'MILD" per pt-  weakness left side  . GERD (gastroesophageal reflux disease)   . Arthritis     Medications:  Prescriptions prior to admission  Medication Sig Dispense Refill Last Dose  . acetaminophen (TYLENOL) 500 MG tablet Take 1,000 mg by mouth every 6 (six) hours as needed for moderate pain or headache.   Past Week at Unknown time  . Calcium Carbonate-Vitamin D (CALCIUM + D PO) Take 1 tablet by mouth every morning.   Past Week at Unknown time  . hydroxychloroquine (PLAQUENIL) 200 MG tablet Take 1 tablet by mouth 2 (two) times daily.   12/01/2014 at 2000  . lisinopril-hydrochlorothiazide (PRINZIDE,ZESTORETIC) 10-12.5 MG per tablet Take 1 tablet by mouth every morning.   12/13/2014 at 0630  . Menthol, Topical Analgesic, (ICY HOT EX) Apply 1 each topically daily as needed (left knee.). Patch.   Past Week at Unknown time  . Multiple Vitamin (MULTIVITAMIN WITH MINERALS) TABS Take 1 tablet by mouth every morning.   Past Week at Unknown time  . nitroGLYCERIN (NITROSTAT) 0.4 MG SL tablet Place 0.4 mg under the tongue every 5 (five) minutes as needed for chest pain. x3 doses as needed for chest pain   never  . omeprazole (PRILOSEC) 20 MG capsule Take 20 mg by mouth every  morning.   Past Week at Unknown time  . tamsulosin (FLOMAX) 0.4 MG CAPS capsule Take 1 capsule by mouth every morning.    Past Week at Unknown time  . traMADol (ULTRAM) 50 MG tablet Take 1 tablet by mouth 2 (two) times daily as needed for moderate pain.    Past Week at Unknown time  . warfarin (COUMADIN) 5 MG tablet Take 5 mg by mouth every morning. STATES DAST DOSE WILL BE 12/08/14   12/09/2014 at 0800  . HYDROcodone-acetaminophen (NORCO/VICODIN) 5-325 MG per tablet Take 1 tablet by mouth every 6 (six) hours as needed. (Patient taking differently: Take 1 tablet by mouth every 6 (six) hours as needed for moderate pain. ) 15 tablet 0   . predniSONE (DELTASONE) 10 MG tablet Take 1 tablet by mouth 2 (two) times daily as needed (lupus flare up.).    More than a month at Unknown time   Scheduled:  . acetaminophen  1,000 mg Oral 4 times per day  .  ceFAZolin (ANCEF) IV  2 g Intravenous Q6H  . [START ON 12/14/2014] dexamethasone  10 mg Intravenous Once  . docusate sodium  100 mg Oral BID  . [START ON 12/14/2014] enoxaparin (LOVENOX) injection  30 mg Subcutaneous Q12H  . fentaNYL      . [START ON 12/14/2014] pantoprazole  40 mg Oral Daily  . [START ON 12/14/2014] tamsulosin  0.4 mg Oral q morning -  10a  . Warfarin - Pharmacist Dosing Inpatient   Does not apply q1800   Infusions:  . sodium chloride 100 mL/hr at 12/13/14 1536   PRN: [START ON 12/14/2014] acetaminophen **OR** [START ON 12/14/2014] acetaminophen, bisacodyl, diphenhydrAMINE, menthol-cetylpyridinium **OR** phenol, methocarbamol **OR** methocarbamol (ROBAXIN)  IV, metoCLOPramide **OR** metoCLOPramide (REGLAN) injection, morphine injection, nitroGLYCERIN, ondansetron **OR** ondansetron (ZOFRAN) IV, oxyCODONE, polyethylene glycol, sodium phosphate, traMADol  Assessment: 73yoM s/p R TKA on 3/14.  Pt with lupus anticoagulant syndrome and history of DVT, on warfarin 5 mg daily at home.  Last dose 3/9.  To resume warfarin per pharmacy for VTE  prophylaxis.  CBC from 3/8 wnl INR subtherapeutic today Also receiving enoxaparin 30 mg bid, to start 3/15   Goal of Therapy:  INR 2-3   Plan:  Warfarin 5 mg PO x 1 tonight Daily INR, CBC at least q72h while on warfarin Monitor for signs of bleeding   Bernadene Personrew Jossette Zirbel, PharmD Pager: [phone] 12/13/2014, 10:45 PM

## 2014-12-13 NOTE — Anesthesia Procedure Notes (Signed)
Spinal Patient location during procedure: OR Staffing Anesthesiologist: Haidyn Chadderdon Performed by: anesthesiologist  Preanesthetic Checklist Completed: patient identified, site marked, surgical consent, pre-op evaluation, timeout performed, IV checked, risks and benefits discussed and monitors and equipment checked Spinal Block Patient position: sitting Prep: Betadine Patient monitoring: heart rate, continuous pulse ox and blood pressure Approach: right paramedian Location: L3-4 Injection technique: single-shot Needle Needle type: Spinocan  Needle gauge: 22 G Needle length: 9 cm Additional Notes Expiration date of kit checked and confirmed. Patient tolerated procedure well, without complications.     

## 2014-12-13 NOTE — Progress Notes (Signed)
Utilization review completed.  

## 2014-12-13 NOTE — Op Note (Signed)
Pre-operative diagnosis- Osteoarthritis  Left knee(s)  Post-operative diagnosis- Osteoarthritis Left knee(s)  Procedure-  Left  Total Knee Arthroplasty  Surgeon- Jose RankinFrank V. Kalil Woessner, MD  Assistant- Avel Peacerew perkins, PA-C   Anesthesia-  Spinal  EBL-* No blood loss amount entered *   Drains Hemovac  Tourniquet time-  Total Tourniquet Time Documented: Thigh (Left) - 32 minutes Total: Thigh (Left) - 32 minutes     Complications- None  Condition-PACU - hemodynamically stable.   Brief Clinical Note   Jose Bush is a 74 y.o. year old male with end stage OA of his left knee with progressively worsening pain and dysfunction. He has constant pain, with activity and at rest and significant functional deficits with difficulties even with ADLs. He has had extensive non-op management including analgesics, injections of cortisone and viscosupplements, and home exercise program, but remains in significant pain with significant dysfunction. Radiographs show bone on bone arthritis medial and patellofemoral. He presents now for left Total Knee Arthroplasty.     Procedure in detail---   The patient is brought into the operating room and positioned supine on the operating table. After successful administration of  Spinal,   a tourniquet is placed high on the  Left thigh(s) and the lower extremity is prepped and draped in the usual sterile fashion. Time out is performed by the operating team and then the  Left lower extremity is wrapped in Esmarch, knee flexed and the tourniquet inflated to 300 mmHg.       A midline incision is made with a ten blade through the subcutaneous tissue to the level of the extensor mechanism. A fresh blade is used to make a medial parapatellar arthrotomy. Soft tissue over the proximal medial tibia is subperiosteally elevated to the joint line with a knife and into the semimembranosus bursa with a Cobb elevator. Soft tissue over the proximal lateral tibia is elevated with attention  being paid to avoiding the patellar tendon on the tibial tubercle. The patella is everted, knee flexed 90 degrees and the ACL and PCL are removed. Findings are bone on bone medial and patellofemoral with large global osteophytes.        The drill is used to create a starting hole in the distal femur and the canal is thoroughly irrigated with sterile saline to remove the fatty contents. The 5 degree Bilateral  valgus alignment guide is placed into the femoral canal and the distal femoral cutting block is pinned to remove 10 mm off the distal femur. Resection is made with an oscillating saw.      The tibia is subluxed forward and the menisci are removed. The extramedullary alignment guide is placed referencing proximally at the medial aspect of the tibial tubercle and distally along the second metatarsal axis and tibial crest. The block is pinned to remove 2mm off the more deficient medial  side. Resection is made with an oscillating saw. Size 5is the most appropriate size for the tibia and the proximal tibia is prepared with the modular drill and keel punch for that size.      The femoral sizing guide is placed and size 5 is most appropriate. Rotation is marked off the epicondylar axis and confirmed by creating a rectangular flexion gap at 90 degrees. The size 5 cutting block is pinned in this rotation and the anterior, posterior and chamfer cuts are made with the oscillating saw. The intercondylar block is then placed and that cut is made.      Trial size 5  tibial component, trial size 5 posterior stabilized femur and a 10  mm posterior stabilized rotating platform insert trial is placed. Full extension is achieved with excellent varus/valgus and anterior/posterior balance throughout full range of motion. The patella is everted and thickness measured to be 27  mm. Free hand resection is taken to 15 mm, a 41 template is placed, lug holes are drilled, trial patella is placed, and it tracks normally. Osteophytes  are removed off the posterior femur with the trial in place. All trials are removed and the cut bone surfaces prepared with pulsatile lavage. Cement is mixed and once ready for implantation, the size 5 tibial implant, size  5 posterior stabilized femoral component, and the size 41 patella are cemented in place and the patella is held with the clamp. The trial insert is placed and the knee held in full extension. The Exparel (20 ml mixed with 30 ml saline) and .25% Bupivicaine, are injected into the extensor mechanism, posterior capsule, medial and lateral gutters and subcutaneous tissues.  All extruded cement is removed and once the cement is hard the permanent 10 mm posterior stabilized rotating platform insert is placed into the tibial tray.      The wound is copiously irrigated with saline solution and the extensor mechanism closed over a hemovac drain with #1 V-loc suture. The tourniquet is released for a total tourniquet time of 32  minutes. Flexion against gravity is 140 degrees and the patella tracks normally. Subcutaneous tissue is closed with 2.0 vicryl and subcuticular with running 4.0 Monocryl. The incision is cleaned and dried and steri-strips and a bulky sterile dressing are applied. The limb is placed into a knee immobilizer and the patient is awakened and transported to recovery in stable condition.      Please note that a surgical assistant was a medical necessity for this procedure in order to perform it in a safe and expeditious manner. Surgical assistant was necessary to retract the ligaments and vital neurovascular structures to prevent injury to them and also necessary for proper positioning of the limb to allow for anatomic placement of the prosthesis.   Jose Rankin Kentley Cedillo, MD    12/13/2014, 1:35 PM

## 2014-12-13 NOTE — Interval H&P Note (Signed)
History and Physical Interval Note:  12/13/2014 11:36 AM  Jose Bush  has presented today for surgery, with the diagnosis of OA OF LEFT KNEE  The various methods of treatment have been discussed with the patient and family. After consideration of risks, benefits and other options for treatment, the patient has consented to  Procedure(s): LEFT TOTAL KNEE ARTHROPLASTY (Left) as a surgical intervention .  The patient's history has been reviewed, patient examined, no change in status, stable for surgery.  I have reviewed the patient's chart and labs.  Questions were answered to the patient's satisfaction.     Loanne DrillingALUISIO,Camira Geidel V

## 2014-12-13 NOTE — H&P (View-Only) (Signed)
Jose Bush DOB: 01/08/1941 Married / Language: English / Race: White Male Date of Admission:  12/13/2014 CC:  Left Knee Pain History of Present Illness (Jose Bush; 11/28/2014 5:16 PM) The patient is a 73 year old male who comes in for a preoperative History and Physical. The patient is scheduled for a left total knee arthroplasty to be performed by Dr. Frank V. Aluisio, MD at [facility] on 12-13-2014. The patient is a 73 year old male who presents for follow up of their knee. The patient is being followed for their left (worse than right) knee pain and osteoarthritis. They are now several months out from Euflexxa series in the right knee. Symptoms reported today include: pain and aching. The patient feels that they are doing poorly and report their pain level to be moderate to severe. Current treatment includes: pain medications (tramadol and Tylenol). The patient has not gotten any relief of their symptoms with viscosupplementation. He is ready to proceed with knee replacement and would like to start with the left knee. Unfortunately, both knees are getting worse. The Visco supplement injections in the right knee did not help. He wants to get the right knee fixed also after he gets the left one done. They have been treated conservatively in the past for the above stated problem and despite conservative measures, they continue to have progressive pain and severe functional limitations and dysfunction. They have failed non-operative management including home exercise, medications, and injections. It is felt that they would benefit from undergoing total joint replacement. Risks and benefits of the procedure have been discussed with the patient and they elect to proceed with surgery. There are no active contraindications to surgery such as ongoing infection or rapidly progressive neurological disease.   Problem List/Past Medical (Jose L Perkins, III Bush;  11/28/2014 5:17 PM) Lupus (M32.9) Primary osteoarthritis of left knee (M17.12) Gastroesophageal Reflux Disease Hiatal Hernia Heart murmur High blood pressure Blood Clot DVT Left Leg Cerebrovascular Accident Ulcer disease Lupus Anticoagulant Disorder Tinnitus Shingles  Allergies  No Known Drug Allergies  Family History (Jose L Perkins, III Bush; 11/19/2014 3:54 PM) Cerebrovascular Accident Mother. Congestive Heart Failure Father.  Social History (Jose L Perkins, III Bush; 11/28/2014 5:12 PM) Tobacco use Former smoker. 11/23/2013: smoke(d) 1/2 pack(s) per day Current work status retired Exercise Exercises weekly; does running / walking and other Children 2 Former drinker 11/23/2013: In the past drank Not under pain contract Tobacco / smoke exposure 11/23/2013: no No history of drug/alcohol rehab Living situation live with spouse Marital status married Advance Directives Living Will, Healthcare POA  Medication History (Jose L Perkins, III Bush; 11/19/2014 3:54 PM) Tylenol (500MG Capsule, Oral) Active. Flomax (0.4MG Capsule, Oral) Active. TraMADol HCl (50MG Tablet, Oral) Active. Lisinopril-Hydrochlorothiazide (10-12.5MG Tablet, Oral) Active. Omeprazole (20MG Capsule DR, Oral) Active. Warfarin Sodium (5MG Tablet, Oral) Active. Aspirin Adult Low Strength (81MG Tablet Chewable, Oral) Active. Calcium "900" w/D (Oral) Active. Nasal Allergy Control (5.2MG/ACT Aerosol Soln, Nasal as needed) Active. Hydroxychloroquine Sulfate (200MG Tablet, Oral) Active. Saline (as needed) Active.  Past Surgical History (Jose L Perkins, III Bush; 11/19/2014 3:56 PM) Arthroscopy of Knee Date: 2007. right Inguinal Hernia Repair Date: 2010. laparoscopic: right Vena Cava Filter Placement Date: 01/2013.   Review of Systems (Jose Bush; 11/19/2014 3:54 PM) General Not Present- Chills, Fatigue, Fever, Memory Loss,  Night Sweats, Weight Gain and Weight Loss. Skin Not Present- Eczema, Hives, Itching, Lesions and Rash. HEENT Not Present- Dentures, Double Vision, Headache,   Hearing Loss, Tinnitus and Visual Loss. Respiratory Not Present- Allergies, Chronic Cough, Coughing up blood, Shortness of breath at rest and Shortness of breath with exertion. Cardiovascular Not Present- Chest Pain, Difficulty Breathing Lying Down, Murmur, Palpitations, Racing/skipping heartbeats and Swelling. Gastrointestinal Not Present- Abdominal Pain, Bloody Stool, Constipation, Diarrhea, Difficulty Swallowing, Heartburn, Jaundice, Loss of appetitie, Nausea and Vomiting. Male Genitourinary Not Present- Blood in Urine, Discharge, Flank Pain, Incontinence, Painful Urination, Urgency, Urinary frequency, Urinary Retention, Urinating at Night and Weak urinary stream. Musculoskeletal Present- Joint Pain. Not Present- Back Pain, Joint Swelling, Morning Stiffness, Muscle Pain, Muscle Weakness and Spasms. Neurological Not Present- Blackout spells, Difficulty with balance, Dizziness, Paralysis, Tremor and Weakness. Psychiatric Not Present- Insomnia.   Vitals (Jose Bush; 11/19/2014 4:00 PM) 11/19/2014 3:59 PM Weight: 205 lb Height: 69in Body Surface Area: 2.09 m Body Mass Index: 30.27 kg/m  BP: 122/64 (Sitting, Right Arm, Standard)   Physical Exam (Jose Bush; 11/28/2014 5:13 PM) General Mental Status -Alert, cooperative and good historian. General Appearance-pleasant, Not in acute distress. Orientation-Oriented X3. Build & Nutrition-Well nourished and Well developed.  Head and Neck Head-normocephalic, atraumatic . Neck Global Assessment - supple, no bruit auscultated on the right, no bruit auscultated on the left. Note: upper and lower denture plates   Eye Vision-Wears corrective lenses. Pupil - Bilateral-Regular and Round. Motion - Bilateral-EOMI.  Chest and Lung  Exam Auscultation Breath sounds - clear at anterior chest wall and clear at posterior chest wall. Adventitious sounds - No Adventitious sounds.  Cardiovascular Auscultation Rhythm - Regular rate and rhythm. Heart Sounds - S1 WNL and S2 WNL. Murmurs & Other Heart Sounds - Auscultation of the heart reveals - No Murmurs.  Abdomen Palpation/Percussion Tenderness - Abdomen is non-tender to palpation. Rigidity (guarding) - Abdomen is soft. Auscultation Auscultation of the abdomen reveals - Bowel sounds normal.  Male Genitourinary Note: Not done, not pertinent to present illness   Musculoskeletal Note: On exam he is alert and oriented in no apparent distress. Both knees show no effusion. There is marked crepitus on range of motion of both knees. Range being about 5 to 115. There is no instability noted.   Assessment & Plan (Jose Bush; 11/28/2014 5:14 PM) Primary osteoarthritis of left knee (M17.12) Primary osteoarthritis of right knee (M17.11) Note:Surgical Plans: Left Total Knee Repalcement  Disposition: Home  PCP: Dr. Kevin Howard - Patient has been seen preoperatively and felt to be stable for surgery.  Topical TXA - Hisotry of Blood Clot  Anesthesia Issues: None  Signed electronically by Jose L Perkins, III Bush 

## 2014-12-13 NOTE — Transfer of Care (Signed)
Immediate Anesthesia Transfer of Care Note  Patient: Jose Bush  Procedure(s) Performed: Procedure(s): LEFT TOTAL KNEE ARTHROPLASTY (Left)  Patient Location: PACU  Anesthesia Type:Regional and Spinal  Level of Consciousness: awake, sedated and patient cooperative  Airway & Oxygen Therapy: Patient Spontanous Breathing and Patient connected to face mask oxygen  Post-op Assessment: Report given to RN and Post -op Vital signs reviewed and stable  Post vital signs: Reviewed and stable  Last Vitals:  Filed Vitals:   12/13/14 0928  BP: 135/65  Pulse: 63  Temp: 36.4 C  Resp: 20    Complications: No apparent anesthesia complications

## 2014-12-13 NOTE — Anesthesia Postprocedure Evaluation (Signed)
  Anesthesia Post-op Note  Patient: Jose Bush  Procedure(s) Performed: Procedure(s) (LRB): LEFT TOTAL KNEE ARTHROPLASTY (Left)  Patient Location: PACU  Anesthesia Type: Spinal  Level of Consciousness: awake and alert   Airway and Oxygen Therapy: Patient Spontanous Breathing  Post-op Pain: mild  Post-op Assessment: Post-op Vital signs reviewed, Patient's Cardiovascular Status Stable, Respiratory Function Stable, Patent Airway and No signs of Nausea or vomiting  Last Vitals:  Filed Vitals:   12/13/14 1530  BP: 131/65  Pulse: 58  Temp: 36.8 C  Resp: 16    Post-op Vital Signs: stable   Complications: No apparent anesthesia complications

## 2014-12-13 NOTE — Anesthesia Preprocedure Evaluation (Signed)
Anesthesia Evaluation  Patient identified by MRN, date of birth, ID band Patient awake    Reviewed: Allergy & Precautions, NPO status , Patient's Chart, lab work & pertinent test results  Airway Mallampati: II  TM Distance: >3 FB Neck ROM: Full    Dental no notable dental hx.    Pulmonary neg pulmonary ROS, former smoker,  breath sounds clear to auscultation  Pulmonary exam normal       Cardiovascular hypertension, Pt. on medications DVT Rhythm:Regular Rate:Normal     Neuro/Psych CVA, Residual Symptoms negative psych ROS   GI/Hepatic negative GI ROS, Neg liver ROS,   Endo/Other  negative endocrine ROS  Renal/GU negative Renal ROS  negative genitourinary   Musculoskeletal Lupus   Abdominal   Peds negative pediatric ROS (+)  Hematology Lupus anticoagulant   Anesthesia Other Findings   Reproductive/Obstetrics negative OB ROS                             Anesthesia Physical Anesthesia Plan  ASA: III  Anesthesia Plan: Spinal   Post-op Pain Management:    Induction:   Airway Management Planned: Simple Face Mask  Additional Equipment:   Intra-op Plan:   Post-operative Plan:   Informed Consent: I have reviewed the patients History and Physical, chart, labs and discussed the procedure including the risks, benefits and alternatives for the proposed anesthesia with the patient or authorized representative who has indicated his/her understanding and acceptance.   Dental advisory given  Plan Discussed with: CRNA  Anesthesia Plan Comments:         Anesthesia Quick Evaluation

## 2014-12-14 ENCOUNTER — Encounter (HOSPITAL_COMMUNITY): Payer: Self-pay | Admitting: Orthopedic Surgery

## 2014-12-14 LAB — CBC
HCT: 32.5 % — ABNORMAL LOW (ref 39.0–52.0)
Hemoglobin: 10.9 g/dL — ABNORMAL LOW (ref 13.0–17.0)
MCH: 29.9 pg (ref 26.0–34.0)
MCHC: 33.5 g/dL (ref 30.0–36.0)
MCV: 89.3 fL (ref 78.0–100.0)
PLATELETS: 155 10*3/uL (ref 150–400)
RBC: 3.64 MIL/uL — ABNORMAL LOW (ref 4.22–5.81)
RDW: 14.3 % (ref 11.5–15.5)
WBC: 9.4 10*3/uL (ref 4.0–10.5)

## 2014-12-14 LAB — BASIC METABOLIC PANEL
Anion gap: 8 (ref 5–15)
BUN: 14 mg/dL (ref 6–23)
CO2: 23 mmol/L (ref 19–32)
Calcium: 8.4 mg/dL (ref 8.4–10.5)
Chloride: 110 mmol/L (ref 96–112)
Creatinine, Ser: 0.75 mg/dL (ref 0.50–1.35)
GFR calc non Af Amer: 88 mL/min — ABNORMAL LOW (ref >90)
Glucose, Bld: 154 mg/dL — ABNORMAL HIGH (ref 70–99)
POTASSIUM: 3.7 mmol/L (ref 3.5–5.1)
Sodium: 141 mmol/L (ref 135–145)

## 2014-12-14 LAB — PROTIME-INR
INR: 1.24 (ref 0.00–1.49)
PROTHROMBIN TIME: 15.7 s — AB (ref 11.6–15.2)

## 2014-12-14 MED ORDER — TRAMADOL HCL 50 MG PO TABS: 50.0000 mg | ORAL_TABLET | Freq: Four times a day (QID) | ORAL | Status: DC | PRN

## 2014-12-14 MED ORDER — OXYCODONE HCL 5 MG PO TABS: 5.0000 mg | ORAL_TABLET | ORAL | Status: DC | PRN

## 2014-12-14 MED ORDER — OMEPRAZOLE 20 MG PO CPDR
20.0000 mg | DELAYED_RELEASE_CAPSULE | Freq: Every morning | ORAL | Status: DC
  Administered 2014-12-14 – 2014-12-15 (×2): 20 mg via ORAL
  Filled 2014-12-14 (×2): qty 1

## 2014-12-14 MED ORDER — WARFARIN SODIUM 5 MG PO TABS
5.0000 mg | ORAL_TABLET | Freq: Once | ORAL | Status: AC
  Administered 2014-12-14: 5 mg via ORAL
  Filled 2014-12-14: qty 1

## 2014-12-14 MED ORDER — ENOXAPARIN SODIUM 30 MG/0.3ML ~~LOC~~ SOLN: 30.0000 mg | Freq: Two times a day (BID) | SUBCUTANEOUS | Status: DC

## 2014-12-14 MED ORDER — METHOCARBAMOL 500 MG PO TABS: 500.0000 mg | ORAL_TABLET | Freq: Four times a day (QID) | ORAL | Status: DC | PRN

## 2014-12-14 MED ORDER — WARFARIN SODIUM 5 MG PO TABS: 5.0000 mg | ORAL_TABLET | Freq: Every morning | ORAL | Status: DC

## 2014-12-14 NOTE — Progress Notes (Signed)
   Subjective: 1 Day Post-Op Procedure(s) (LRB): LEFT TOTAL KNEE ARTHROPLASTY (Left) Patient reports pain as mild.   Patient seen in rounds with Dr. Lequita HaltAluisio. Rough night with no sleep. Patient is well, but has had some minor complaints of pain in the knee, requiring pain medications We will start therapy today.  Plan is to go Home after hospital stay.  Objective: Vital signs in last 24 hours: Temp:  [97.2 F (36.2 C)-98.3 F (36.8 C)] 97.6 F (36.4 C) (03/15 0655) Pulse Rate:  [51-78] 70 (03/15 0655) Resp:  [10-20] 18 (03/15 0739) BP: (110-140)/(57-72) 123/69 mmHg (03/15 0655) SpO2:  [96 %-100 %] 97 % (03/15 0739) Weight:  [90.719 kg (200 lb)-94.348 kg (208 lb)] 90.719 kg (200 lb) (03/14 1557)  Intake/Output from previous day:  Intake/Output Summary (Last 24 hours) at 12/14/14 0845 Last data filed at 12/14/14 0750  Gross per 24 hour  Intake   4500 ml  Output   2780 ml  Net   1720 ml    Intake/Output this shift: Total I/O In: -  Out: 1080 [Urine:1075; Drains:5]  Labs:  Recent Labs  12/14/14 0440  HGB 10.9*    Recent Labs  12/14/14 0440  WBC 9.4  RBC 3.64*  HCT 32.5*  PLT 155    Recent Labs  12/14/14 0440  NA 141  K 3.7  CL 110  CO2 23  BUN 14  CREATININE 0.75  GLUCOSE 154*  CALCIUM 8.4    Recent Labs  12/13/14 0950 12/14/14 0440  INR 1.07 1.24    EXAM General - Patient is Alert, Appropriate and Oriented Extremity - Neurovascular intact Sensation intact distally Dorsiflexion/Plantar flexion intact Dressing - dressing C/D/I Motor Function - intact, moving foot and toes well on exam.  Hemovac pulled without difficulty.  Past Medical History  Diagnosis Date  . HTN (hypertension)   . Chest pain, unspecified   . DVT (deep venous thrombosis)   . Lupus   . Stroke 2010    'MILD" per pt-  weakness left side  . GERD (gastroesophageal reflux disease)   . Arthritis     Assessment/Plan: 1 Day Post-Op Procedure(s) (LRB): LEFT TOTAL KNEE  ARTHROPLASTY (Left) Principal Problem:   OA (osteoarthritis) of knee  Estimated body mass index is 30.42 kg/(m^2) as calculated from the following:   Height as of this encounter: 5\' 8"  (1.727 m).   Weight as of this encounter: 90.719 kg (200 lb). Advance diet Up with therapy Plan for discharge tomorrow Discharge home with home health  DVT Prophylaxis - Lovenox and Coumadin  History of Blood Clot Lupus Anticoagulant Disorder Weight-Bearing as tolerated to left leg D/C O2 and Pulse OX and try on Room Air  Avel Peacerew Perkins, PA-C Orthopaedic Surgery 12/14/2014, 8:45 AM

## 2014-12-14 NOTE — Evaluation (Signed)
Physical Therapy Evaluation Patient Details Name: Jose Bush MRN: [id number] DOB: May 20, 1941 Today's Date: 12/14/2014   History of Present Illness  74 yo male s/p L TKA  Clinical Impression  Pt admitted with above diagnosis. Pt currently with functional limitations due to the deficits listed below (see PT Problem List).  Pt will benefit from skilled PT to increase their independence and safety with mobility to allow discharge to the venue listed below.  Plan is for home with HHPT, wife assist prn     Follow Up Recommendations Home health PT    Equipment Recommendations  None recommended by PT    Recommendations for Other Services       Precautions / Restrictions Precautions Precautions: Knee Precaution Comments: I SLR today, KI not used Required Braces or Orthoses: Knee Immobilizer - Left Knee Immobilizer - Left: Discontinue once straight leg raise with < 10 degree lag Restrictions Weight Bearing Restrictions: No Other Position/Activity Restrictions: WBAT      Mobility  Bed Mobility Overal bed mobility: Needs Assistance Bed Mobility: Supine to Sit     Supine to sit: Min guard     General bed mobility comments: cues for technique  Transfers Overall transfer level: Needs assistance Equipment used: Rolling walker (2 wheeled) Transfers: Sit to/from Stand Sit to Stand: Min assist;Min guard         General transfer comment: cues for hand placement and LLE position  Ambulation/Gait Ambulation/Gait assistance: Min guard Ambulation Distance (Feet): 90 Feet Assistive device: Rolling walker (2 wheeled) Gait Pattern/deviations: Antalgic;Step-to pattern     General Gait Details: cues for step length and RW position  Stairs            Wheelchair Mobility    Modified Rankin (Stroke Patients Only)       Balance                                             Pertinent Vitals/Pain Pain Assessment: 0-10 Pain Score: 2  Pain  Location: L knee Pain Descriptors / Indicators: Discomfort Pain Intervention(s): Limited activity within patient's tolerance;Monitored during session;Repositioned;Premedicated before session;Ice applied    Home Living Family/patient expects to be discharged to:: Private residence Living Arrangements: Spouse/significant other     Home Access: Stairs to enter Entrance Stairs-Rails: None (suction handle on side) Entrance Stairs-Number of Steps: 2 Home Layout: One level Home Equipment: Toilet riser;Walker - 2 wheels;Bedside commode;Cane - single point      Prior Function     Gait / Transfers Assistance Needed: cane and wlaker just before surgery           Hand Dominance        Extremity/Trunk Assessment   Upper Extremity Assessment: Defer to OT evaluation           Lower Extremity Assessment: LLE deficits/detail   LLE Deficits / Details: hip flexion and knee extension 2+/5; ankle WFL     Communication   Communication: No difficulties  Cognition Arousal/Alertness: Awake/alert Behavior During Therapy: WFL for tasks assessed/performed Overall Cognitive Status: Within Functional Limits for tasks assessed                      General Comments      Exercises Total Joint Exercises Ankle Circles/Pumps: AROM;Both;10 reps Quad Sets: 10 reps;Both;Strengthening Straight Leg Raises: AROM;Left;10 reps      Assessment/Plan  PT Assessment Patient needs continued PT services  PT Diagnosis Difficulty walking   PT Problem List Decreased strength;Decreased range of motion;Decreased mobility;Decreased knowledge of use of DME  PT Treatment Interventions DME instruction;Gait training;Functional mobility training;Stair training;Therapeutic activities;Patient/family education;Therapeutic exercise   PT Goals (Current goals can be found in the Care Plan section) Acute Rehab PT Goals Patient Stated Goal: back to I, active lifestyle PT Goal Formulation: With  patient Time For Goal Achievement: 12/17/14 Potential to Achieve Goals: Good    Frequency 7X/week   Barriers to discharge        Co-evaluation               End of Session Equipment Utilized During Treatment: Gait belt Activity Tolerance: Patient tolerated treatment well Patient left: in chair;with call bell/phone within reach;with family/visitor present           Time: 0917-0942 PT Time Calculation (min) (ACUTE ONLY): 25 min   Charges:   PT Evaluation $Initial PT Evaluation Tier I: 1 Procedure PT Treatments $Gait Training: 8-22 mins   PT G Codes:        Jose Bush 2014/12/25, 10:36 AM

## 2014-12-14 NOTE — Progress Notes (Signed)
Physical Therapy Treatment Patient Details Name: Jose Bush MRN: [id number] DOB: [birth year] Today's Date: 12/14/2014    History of Present Illness 74 yo male s/p L TKA    PT Comments    Pt making good progress.  Cues for increasing range during gait cycle.  Stair training tomorrow and con't to recommend HHPT.  Follow Up Recommendations  Home health PT     Equipment Recommendations  None recommended by PT    Recommendations for Other Services       Precautions / Restrictions Precautions Precautions: Knee Precaution Comments: I SLR today, KI not used Required Braces or Orthoses: Knee Immobilizer - Left Knee Immobilizer - Left: Discontinue once straight leg raise with < 10 degree lag Restrictions Weight Bearing Restrictions: No Other Position/Activity Restrictions: WBAT    Mobility  Bed Mobility Overal bed mobility: Needs Assistance Bed Mobility: Sit to Supine      Sit to supine: Min assist   General bed mobility comments: A for lifting L leg onto bed  Transfers Overall transfer level: Needs assistance Equipment used: Rolling walker (2 wheeled) Transfers: Sit to/from Stand Sit to Stand: Min guard;Supervision         General transfer comment: min/guard from recliner, S from toilet  Ambulation/Gait Ambulation/Gait assistance: Min guard Ambulation Distance (Feet): 100 Feet Assistive device: Rolling walker (2 wheeled) Gait Pattern/deviations: Step-to pattern;Antalgic Gait velocity: decreased   General Gait Details: cues to push RW and not pick up.  Keeps knee in flexed position throughout gait cycle and needs cues for increase range   Stairs            Wheelchair Mobility    Modified Rankin (Stroke Patients Only)       Balance                                    Cognition Arousal/Alertness: Awake/alert Behavior During Therapy: WFL for tasks assessed/performed Overall Cognitive Status: Within Functional Limits for tasks  assessed                      Exercises Total Joint Exercises Ankle Circles/Pumps: AROM;Both;10 reps Quad Sets: 10 reps;Both;Strengthening Heel Slides: AROM;Left;10 reps;Supine Straight Leg Raises: AROM;Left;10 reps    General Comments General comments (skin integrity, edema, etc.): min assist standing for clothing management.      Pertinent Vitals/Pain Pain Assessment: 0-10 Pain Score: 8  Pain Location: L knee Pain Descriptors / Indicators: Aching;Sore Pain Intervention(s): Repositioned;Ice applied;Patient requesting pain meds-RN notified    Home Living Family/patient expects to be discharged to:: Private residence Living Arrangements: Spouse/significant other     Home Access: Stairs to enter Entrance Stairs-Rails: None (suction handle on side) Home Layout: One level Home Equipment: Toilet riser;Walker - 2 wheels;Cane - single point;Shower seat      Prior Function    Gait / Transfers Assistance Needed: cane and wlaker just before surgery       PT Goals (current goals can now be found in the care plan section) Acute Rehab PT Goals Patient Stated Goal: return to independence. PT Goal Formulation: With patient Time For Goal Achievement: 12/17/14 Potential to Achieve Goals: Good Progress towards PT goals: Progressing toward goals    Frequency  7X/week    PT Plan Current plan remains appropriate    Co-evaluation             End of Session Equipment Utilized During Treatment:  Gait belt Activity Tolerance: Patient tolerated treatment well Patient left: in bed;with call bell/phone within reach;with family/visitor present;Other (comment) (with ortho tech in to set up on CPM)     Time: 1610-9604 PT Time Calculation (min) (ACUTE ONLY): 22 min  Charges:  $Gait Training: 8-22 mins                    G Codes:      Shelli Portilla LUBECK 12/14/2014, 2:26 PM

## 2014-12-14 NOTE — Care Management Note (Addendum)
    Page 1 of 2   12/15/2014     11:03:13 AM CARE MANAGEMENT NOTE 12/15/2014  Patient:  Jose Bush, Jose Bush   Account Number:  [id number]  Date Initiated:  12/14/2014  Documentation initiated by:  [facility]  Subjective/Objective Assessment:   adm: LEFT TOTAL KNEE ARTHROPLASTY (Left)     Action/Plan:   discharge planning   Anticipated DC Date:  12/15/2014   Anticipated DC Plan:  [place]  CM consult      [facility] Choice  HOME HEALTH   Choice offered to / List presented to:  C-1 Patient        [place] arranged  [place]      [place].   Status of service:  Completed, signed off Medicare Important Message given?   (If response is "NO", the following Medicare IM given date fields will be blank) Date Medicare IM given:   Medicare IM given by:   Date Additional Medicare IM given:   Additional Medicare IM given by:    Discharge Disposition:  [place]  Per UR Regulation:    If discussed at Long Length of Stay Meetings, dates discussed:    Comments:  12/15/14 10:55 CM received call from RN to please add on [facility] for INR checks per [facility] protocol.  CM called referral to [facility] rep, Stephanie.  No other CM needs were communicated. Mariane Masters, BSN, Jearld Lesch [phone]. 12/14/14 08:30 CM met with pt in room to offer choice of home health agency.  Pt chooses [facility] to render HHPT. Address and contact information verified with pt.  Pt has rolling walker, 3n1, and shower stool at home.  Referral called to [facility] rep, Kristen.  No other CM needs were communicated. Mariane Masters, BSN, CM [phone].

## 2014-12-14 NOTE — Evaluation (Signed)
Occupational Therapy Evaluation Patient Details Name: Jose Bush MRN: [id number] DOB: [birth year] Today's Date: 12/14/2014    History of Present Illness 74 yo male s/p L TKA   Clinical Impression   Pt doing well. Practiced up to 3in1. Will follow on acute to progress ADL independence.     Follow Up Recommendations  No OT follow up;Supervision/Assistance - 24 hour    Equipment Recommendations  None recommended by OT    Recommendations for Other Services       Precautions / Restrictions Precautions Precautions: Knee Precaution Comments: I SLR today, KI not used Required Braces or Orthoses: Knee Immobilizer - Left Knee Immobilizer - Left: Discontinue once straight leg raise with < 10 degree lag Restrictions Weight Bearing Restrictions: No Other Position/Activity Restrictions: WBAT      Mobility Bed Mobility             Transfers Overall transfer level: Needs assistance Equipment used: Rolling walker (2 wheeled) Transfers: Sit to/from Stand Sit to Stand: Min guard;Min assist         General transfer comment: cues for hand placement and LLE position    Balance                                            ADL Overall ADL's : Needs assistance/impaired Eating/Feeding: Independent;Sitting   Grooming: Wash/dry hands;Set up;Sitting   Upper Body Bathing: Set up;Sitting   Lower Body Bathing: Minimal assistance;Sit to/from stand   Upper Body Dressing : Set up;Sitting   Lower Body Dressing: Minimal assistance;Sit to/from stand   Toilet Transfer: Minimal assistance;Ambulation;BSC;RW   Toileting- Clothing Manipulation and Hygiene: Minimal assistance;Sit to/from stand         General ADL Comments: Pt has a toilet riser with handles at home and states he has sat on it and it works well and his feet do touch the floor. discussed importance of feet touching the floor when he sits on riser to help with safety with transfers on and off  riser. He has a shower chair that also has handles. Educated on sequence for LB dressing and pt able to don/doff L sock with some increased time. Pt doing well. Wife present.      Vision     Perception     Praxis      Pertinent Vitals/Pain Pain Assessment: 0-10 Pain Score: 6  Pain Location: L knee Pain Descriptors / Indicators: Aching Pain Intervention(s): Repositioned;Patient requesting pain meds-RN notified;Ice applied     Hand Dominance     Extremity/Trunk Assessment Upper Extremity Assessment Upper Extremity Assessment: Overall WFL for tasks assessed          Communication Communication Communication: No difficulties   Cognition Arousal/Alertness: Awake/alert Behavior During Therapy: WFL for tasks assessed/performed Overall Cognitive Status: Within Functional Limits for tasks assessed                     General Comments       Exercises       Shoulder Instructions      Home Living Family/patient expects to be discharged to:: Private residence Living Arrangements: Spouse/significant other     Home Access: Stairs to enter Secretary/administratorntrance Stairs-Number of Steps: 2 Entrance Stairs-Rails: None (suction handle on side) Home Layout: One level     Bathroom Shower/Tub: Producer, television/film/videoWalk-in shower   Bathroom Toilet: Standard     Home  Equipment: Toilet riser;Walker - 2 wheels;Cane - single point;Shower seat          Prior Functioning/Environment    Gait / Transfers Assistance Needed: cane and wlaker just before surgery          OT Diagnosis: Generalized weakness   OT Problem List: Decreased strength;Decreased knowledge of use of DME or AE   OT Treatment/Interventions: Self-care/ADL training;Patient/family education;Therapeutic activities;DME and/or AE instruction    OT Goals(Current goals can be found in the care plan section) Acute Rehab OT Goals Patient Stated Goal: return to independence. OT Goal Formulation: With patient/family Time For Goal  Achievement: 12/21/14 Potential to Achieve Goals: Good  OT Frequency: Min 2X/week   Barriers to D/C:            Co-evaluation              End of Session Equipment Utilized During Treatment: Rolling walker  Activity Tolerance: Patient tolerated treatment well Patient left: in chair;with call bell/phone within reach;with family/visitor present   Time: 5784-6962 OT Time Calculation (min): 23 min Charges:  OT General Charges $OT Visit: 1 Procedure OT Evaluation $Initial OT Evaluation Tier I: 1 Procedure G-Codes:    Lennox Laity  [phone] 12/14/2014, 12:28 PM

## 2014-12-14 NOTE — Progress Notes (Signed)
ANTICOAGULATION CONSULT NOTE - Follow Up Consult  Pharmacy Consult for coumadin Indication: VTE prophylaxis; lupus anticoagulant syndrome and history of DVT  No Known Allergies  Patient Measurements: Height: 5\' 8"  (172.7 cm) Weight: 200 lb (90.719 kg) IBW/kg (Calculated) : 68.4  Vital Signs: Temp: 97.8 F (36.6 C) (03/15 1030) Temp Source: Oral (03/15 1030) BP: 147/65 mmHg (03/15 1030) Pulse Rate: 63 (03/15 1030)  Labs:  Recent Labs  12/13/14 0950 12/14/14 0440  HGB  --  10.9*  HCT  --  32.5*  PLT  --  155  LABPROT 14.0 15.7*  INR 1.07 1.24  CREATININE  --  0.75    Estimated Creatinine Clearance: 88.6 mL/min (by C-G formula based on Cr of 0.75).  Assessment: Patient is a 74 y.o M on coumadin PTA for lupus anticoagulant syndrome and history of DVT.  S/p R TKA on 3/14.  Coumadin resumed post procedure on 3/14.  INR 1.24 today. Hgb down 10.9, plt ok, no bleeding documented.  Home coumadin regimen: 5mg  daily  Goal of Therapy:  INR 2-3    Plan:  - coumadin 5mg  PO x1 today - continue lovenox 30mg  SQ q12h until INR > or = 1.8  Reggie Bise P 12/14/2014,10:59 AM

## 2014-12-15 LAB — CBC
HEMATOCRIT: 31.4 % — AB (ref 39.0–52.0)
Hemoglobin: 10.4 g/dL — ABNORMAL LOW (ref 13.0–17.0)
MCH: 29.8 pg (ref 26.0–34.0)
MCHC: 33.1 g/dL (ref 30.0–36.0)
MCV: 90 fL (ref 78.0–100.0)
PLATELETS: 149 10*3/uL — AB (ref 150–400)
RBC: 3.49 MIL/uL — ABNORMAL LOW (ref 4.22–5.81)
RDW: 14.5 % (ref 11.5–15.5)
WBC: 8.2 10*3/uL (ref 4.0–10.5)

## 2014-12-15 LAB — BASIC METABOLIC PANEL
ANION GAP: 10 (ref 5–15)
BUN: 13 mg/dL (ref 6–23)
CALCIUM: 8.7 mg/dL (ref 8.4–10.5)
CHLORIDE: 109 mmol/L (ref 96–112)
CO2: 23 mmol/L (ref 19–32)
CREATININE: 0.71 mg/dL (ref 0.50–1.35)
Glucose, Bld: 145 mg/dL — ABNORMAL HIGH (ref 70–99)
Potassium: 3.6 mmol/L (ref 3.5–5.1)
Sodium: 142 mmol/L (ref 135–145)

## 2014-12-15 LAB — PROTIME-INR
INR: 1.35 (ref 0.00–1.49)
PROTHROMBIN TIME: 16.8 s — AB (ref 11.6–15.2)

## 2014-12-15 MED ORDER — HYDROCHLOROTHIAZIDE 12.5 MG PO CAPS
12.5000 mg | ORAL_CAPSULE | Freq: Every day | ORAL | Status: DC
  Administered 2014-12-15: 12.5 mg via ORAL
  Filled 2014-12-15: qty 1

## 2014-12-15 MED ORDER — LISINOPRIL 10 MG PO TABS
10.0000 mg | ORAL_TABLET | Freq: Every day | ORAL | Status: DC
  Administered 2014-12-15: 10 mg via ORAL
  Filled 2014-12-15: qty 1

## 2014-12-15 MED ORDER — WARFARIN SODIUM 5 MG PO TABS
5.0000 mg | ORAL_TABLET | Freq: Once | ORAL | Status: DC
  Filled 2014-12-15: qty 1

## 2014-12-15 MED ORDER — LISINOPRIL-HYDROCHLOROTHIAZIDE 10-12.5 MG PO TABS: 1.0000 | ORAL_TABLET | Freq: Every morning | ORAL | Status: DC

## 2014-12-15 NOTE — Progress Notes (Signed)
Pt being d/c home with [facility]. No equipment needs. Pt capable of verbalizing medications, dressing changes, signs and symptoms of infection, and follow-up appointments. Remains hemodynamically stable. No signs and symptoms of distress. Educated pt to return to ER in the case of SOB, dizziness, or chest pain.

## 2014-12-15 NOTE — Progress Notes (Signed)
ANTICOAGULATION CONSULT NOTE - Follow Up  Pharmacy Consult for Coumadin Indication: VTE prophylaxis; lupus anticoagulant syndrome and history of DVT  No Known Allergies  Patient Measurements: Height: 5\' 8"  (172.7 cm) Weight: 200 lb (90.719 kg) IBW/kg (Calculated) : 68.4  Vital Signs: Temp: 98.8 F (37.1 C) (03/16 0641) Temp Source: Oral (03/16 0641) BP: 161/72 mmHg (03/16 0654) Pulse Rate: 83 (03/16 0654)  Labs:  Recent Labs  12/13/14 0950 12/14/14 0440 12/15/14 0444  HGB  --  10.9* 10.4*  HCT  --  32.5* 31.4*  PLT  --  155 149*  LABPROT 14.0 15.7* 16.8*  INR 1.07 1.24 1.35  CREATININE  --  0.75 0.71    Estimated Creatinine Clearance: 88.6 mL/min (by C-G formula based on Cr of 0.71).  Assessment: Patient is a 74 y.o M on coumadin PTA for lupus anticoagulant syndrome and history of DVT.  S/p R TKA on 3/14.  Coumadin resumed post procedure on 3/14.  Home coumadin regimen: 5mg  daily.  3/15: INR 1.24. Coumadin 5mg .  Today, 12/15/2014:  INR rising on home regimen.   CBC stable. No bleeding reported/documented.  Tolerating diet.  Goal of Therapy:  INR 2-3   Plan:  Repeat Coumadin 5mg  PO x1 today. Continue lovenox 30mg  SQ q12h until INR > or = 1.8. Reinforced the importance of close monitoring of INR in this post-op period.  Charolotte Ekeom Bently Wyss, PharmD, pager [phone]. 12/15/2014,9:21 AM.

## 2014-12-15 NOTE — Progress Notes (Signed)
   Subjective: 2 Days Post-Op Procedure(s) (LRB): LEFT TOTAL KNEE ARTHROPLASTY (Left) Patient reports pain as mild.   Patient seen in rounds with Dr. Lequita HaltAluisio. Patient is well, but has had some minor complaints of pain in the knee, requiring pain medications Patient is ready to go home  Objective: Vital signs in last 24 hours: Temp:  [97.8 F (36.6 C)-98.8 F (37.1 C)] 98.8 F (37.1 C) (03/16 0641) Pulse Rate:  [63-91] 83 (03/16 0654) Resp:  [16-18] 16 (03/16 0800) BP: (141-180)/(65-76) 161/72 mmHg (03/16 0654) SpO2:  [96 %-99 %] 98 % (03/16 0641)  Intake/Output from previous day:  Intake/Output Summary (Last 24 hours) at 12/15/14 0818 Last data filed at 12/14/14 2200  Gross per 24 hour  Intake 1743.83 ml  Output    475 ml  Net 1268.83 ml   Labs:  Recent Labs  12/14/14 0440 12/15/14 0444  HGB 10.9* 10.4*    Recent Labs  12/14/14 0440 12/15/14 0444  WBC 9.4 8.2  RBC 3.64* 3.49*  HCT 32.5* 31.4*  PLT 155 149*    Recent Labs  12/14/14 0440 12/15/14 0444  NA 141 142  K 3.7 3.6  CL 110 109  CO2 23 23  BUN 14 13  CREATININE 0.75 0.71  GLUCOSE 154* 145*  CALCIUM 8.4 8.7    Recent Labs  12/14/14 0440 12/15/14 0444  INR 1.24 1.35    EXAM: General - Patient is Alert, Appropriate and Oriented Extremity - Neurovascular intact Sensation intact distally Incision - clean, dry, no drainage Motor Function - intact, moving foot and toes well on exam.   Assessment/Plan: 2 Days Post-Op Procedure(s) (LRB): LEFT TOTAL KNEE ARTHROPLASTY (Left) Procedure(s) (LRB): LEFT TOTAL KNEE ARTHROPLASTY (Left) Past Medical History  Diagnosis Date  . HTN (hypertension)   . Chest pain, unspecified   . DVT (deep venous thrombosis)   . Lupus   . Stroke 2010    'MILD" per pt-  weakness left side  . GERD (gastroesophageal reflux disease)   . Arthritis    Principal Problem:   OA (osteoarthritis) of knee  Estimated body mass index is 30.42 kg/(m^2) as calculated  from the following:   Height as of this encounter: 5\' 8"  (1.727 m).   Weight as of this encounter: 90.719 kg (200 lb). Up with therapy Discharge home with home health Diet - Cardiac diet Follow up - in 2 weeks Activity - WBAT Disposition - Home Condition Upon Discharge - Good D/C Meds - See DC Summary DVT Prophylaxis - Lovenox and Coumadin  Jose Peacerew Perkins, PA-C Orthopaedic Surgery 12/15/2014, 8:18 AM

## 2014-12-15 NOTE — Discharge Summary (Signed)
Physician Discharge Summary   Patient ID: Jose Bush MRN: [id number] DOB/AGE: Jul 18, 1941 74 y.o.  Admit date: 12/13/2014 Discharge date: 12/15/2014  Primary Diagnosis:  Osteoarthritis Left knee(s) Admission Diagnoses:  Past Medical History  Diagnosis Date  . HTN (hypertension)   . Chest pain, unspecified   . DVT (deep venous thrombosis)   . Lupus   . Stroke 2010    'MILD" per pt-  weakness left side  . GERD (gastroesophageal reflux disease)   . Arthritis    Discharge Diagnoses:   Principal Problem:   OA (osteoarthritis) of knee  Estimated body mass index is 30.42 kg/(m^2) as calculated from the following:   Height as of this encounter: _0  (1.727 m).   Weight as of this encounter: 90.719 kg (200 lb).  Procedure:  Procedure(s) (LRB): LEFT TOTAL KNEE ARTHROPLASTY (Left)   Consults: None  HPI: Jose Bush is a 74 y.o. year old male with end stage OA of his left knee with progressively worsening pain and dysfunction. He has constant pain, with activity and at rest and significant functional deficits with difficulties even with ADLs. He has had extensive non-op management including analgesics, injections of cortisone and viscosupplements, and home exercise program, but remains in significant pain with significant dysfunction. Radiographs show bone on bone arthritis medial and patellofemoral. He presents now for left Total Knee Arthroplasty.  Laboratory Data: Admission on 12/13/2014, Discharged on 12/15/2014  Component Date Value Ref Range Status  . Prothrombin Time 12/13/2014 14.0  11.6 - 15.2 seconds Final  . INR 12/13/2014 1.07  0.00 - 1.49 Final  . WBC 12/14/2014 9.4  4.0 - 10.5 K/uL Final  . RBC 12/14/2014 3.64* 4.22 - 5.81 MIL/uL Final  . Hemoglobin 12/14/2014 10.9* 13.0 - 17.0 g/dL Final  . HCT 12/14/2014 32.5* 39.0 - 52.0 % Final  . MCV 12/14/2014 89.3  78.0 - 100.0 fL Final  . MCH 12/14/2014 29.9  26.0 - 34.0 pg Final  . MCHC 12/14/2014 33.5  30.0 - 36.0  g/dL Final  . RDW 12/14/2014 14.3  11.5 - 15.5 % Final  . Platelets 12/14/2014 155  150 - 400 K/uL Final  . Sodium 12/14/2014 141  135 - 145 mmol/L Final  . Potassium 12/14/2014 3.7  3.5 - 5.1 mmol/L Final  . Chloride 12/14/2014 110  96 - 112 mmol/L Final  . CO2 12/14/2014 23  19 - 32 mmol/L Final  . Glucose, Bld 12/14/2014 154* 70 - 99 mg/dL Final  . BUN 12/14/2014 14  6 - 23 mg/dL Final  . Creatinine, Ser 12/14/2014 0.75  0.50 - 1.35 mg/dL Final  . Calcium 12/14/2014 8.4  8.4 - 10.5 mg/dL Final  . GFR calc non Af Amer 12/14/2014 88* >90 mL/min Final  . GFR calc Af Amer 12/14/2014 >90  >90 mL/min Final   Comment: (NOTE) The eGFR has been calculated using the CKD EPI equation. This calculation has not been validated in all clinical situations. eGFR's persistently <90 mL/min signify possible Chronic Kidney Disease.   . Anion gap 12/14/2014 8  5 - 15 Final  . Prothrombin Time 12/14/2014 15.7* 11.6 - 15.2 seconds Final  . INR 12/14/2014 1.24  0.00 - 1.49 Final  . WBC 12/15/2014 8.2  4.0 - 10.5 K/uL Final  . RBC 12/15/2014 3.49* 4.22 - 5.81 MIL/uL Final  . Hemoglobin 12/15/2014 10.4* 13.0 - 17.0 g/dL Final  . HCT 12/15/2014 31.4* 39.0 - 52.0 % Final  . MCV 12/15/2014 90.0  78.0 - 100.0 fL  Final  . MCH 12/15/2014 29.8  26.0 - 34.0 pg Final  . MCHC 12/15/2014 33.1  30.0 - 36.0 g/dL Final  . RDW 12/15/2014 14.5  11.5 - 15.5 % Final  . Platelets 12/15/2014 149* 150 - 400 K/uL Final  . Sodium 12/15/2014 142  135 - 145 mmol/L Final  . Potassium 12/15/2014 3.6  3.5 - 5.1 mmol/L Final  . Chloride 12/15/2014 109  96 - 112 mmol/L Final  . CO2 12/15/2014 23  19 - 32 mmol/L Final  . Glucose, Bld 12/15/2014 145* 70 - 99 mg/dL Final  . BUN 12/15/2014 13  6 - 23 mg/dL Final  . Creatinine, Ser 12/15/2014 0.71  0.50 - 1.35 mg/dL Final  . Calcium 12/15/2014 8.7  8.4 - 10.5 mg/dL Final  . GFR calc non Af Amer 12/15/2014 >90  >90 mL/min Final  . GFR calc Af Amer 12/15/2014 >90  >90 mL/min Final    Comment: (NOTE) The eGFR has been calculated using the CKD EPI equation. This calculation has not been validated in all clinical situations. eGFR's persistently <90 mL/min signify possible Chronic Kidney Disease.   . Anion gap 12/15/2014 10  5 - 15 Final  . Prothrombin Time 12/15/2014 16.8* 11.6 - 15.2 seconds Final  . INR 12/15/2014 1.35  0.00 - 1.49 Final  Hospital Outpatient Visit on 12/07/2014  Component Date Value Ref Range Status  . aPTT 12/07/2014 41* 24 - 37 seconds Final   Comment:        IF BASELINE aPTT IS ELEVATED, SUGGEST PATIENT RISK ASSESSMENT BE USED TO DETERMINE APPROPRIATE ANTICOAGULANT THERAPY.   . WBC 12/07/2014 5.4  4.0 - 10.5 K/uL Final  . RBC 12/07/2014 4.38  4.22 - 5.81 MIL/uL Final  . Hemoglobin 12/07/2014 13.1  13.0 - 17.0 g/dL Final  . HCT 12/07/2014 39.0  39.0 - 52.0 % Final  . MCV 12/07/2014 89.0  78.0 - 100.0 fL Final  . MCH 12/07/2014 29.9  26.0 - 34.0 pg Final  . MCHC 12/07/2014 33.6  30.0 - 36.0 g/dL Final  . RDW 12/07/2014 14.3  11.5 - 15.5 % Final  . Platelets 12/07/2014 148* 150 - 400 K/uL Final  . Sodium 12/07/2014 138  135 - 145 mmol/L Final  . Potassium 12/07/2014 4.1  3.5 - 5.1 mmol/L Final  . Chloride 12/07/2014 108  96 - 112 mmol/L Final  . CO2 12/07/2014 26  19 - 32 mmol/L Final  . Glucose, Bld 12/07/2014 95  70 - 99 mg/dL Final  . BUN 12/07/2014 15  6 - 23 mg/dL Final  . Creatinine, Ser 12/07/2014 0.71  0.50 - 1.35 mg/dL Final  . Calcium 12/07/2014 9.1  8.4 - 10.5 mg/dL Final  . Total Protein 12/07/2014 6.9  6.0 - 8.3 g/dL Final  . Albumin 12/07/2014 4.1  3.5 - 5.2 g/dL Final  . AST 12/07/2014 34  0 - 37 U/L Final  . ALT 12/07/2014 33  0 - 53 U/L Final  . Alkaline Phosphatase 12/07/2014 59  39 - 117 U/L Final  . Total Bilirubin 12/07/2014 0.5  0.3 - 1.2 mg/dL Final  . GFR calc non Af Amer 12/07/2014 >90  >90 mL/min Final  . GFR calc Af Amer 12/07/2014 >90  >90 mL/min Final   Comment: (NOTE) The eGFR has been calculated using the  CKD EPI equation. This calculation has not been validated in all clinical situations. eGFR's persistently <90 mL/min signify possible Chronic Kidney Disease.   . Anion gap 12/07/2014 4* 5 -  15 Final  . Prothrombin Time 12/07/2014 25.5* 11.6 - 15.2 seconds Final  . INR 12/07/2014 2.30* 0.00 - 1.49 Final  . ABO/RH(D) 12/07/2014 O POS   Final  . Antibody Screen 12/07/2014 NEG   Final  . Sample Expiration 12/07/2014 12/16/2014   Final  . MRSA, PCR 12/07/2014 NEGATIVE  NEGATIVE Final  . Staphylococcus aureus 12/07/2014 NEGATIVE  NEGATIVE Final   Comment:        The Xpert SA Assay (FDA approved for NASAL specimens in patients over 64 years of age), is one component of a comprehensive surveillance program.  Test performance has been validated by [facility] for patients greater than or equal to 70 year old. It is not intended to diagnose infection nor to guide or monitor treatment. Performed at [facility]   . Color, Urine 12/07/2014 YELLOW  YELLOW Final  . APPearance 12/07/2014 CLEAR  CLEAR Final  . Specific Gravity, Urine 12/07/2014 1.012  1.005 - 1.030 Final  . pH 12/07/2014 6.0  5.0 - 8.0 Final  . Glucose, UA 12/07/2014 NEGATIVE  NEGATIVE mg/dL Final  . Hgb urine dipstick 12/07/2014 NEGATIVE  NEGATIVE Final  . Bilirubin Urine 12/07/2014 NEGATIVE  NEGATIVE Final  . Ketones, ur 12/07/2014 NEGATIVE  NEGATIVE mg/dL Final  . Protein, ur 12/07/2014 NEGATIVE  NEGATIVE mg/dL Final  . Urobilinogen, UA 12/07/2014 0.2  0.0 - 1.0 mg/dL Final  . Nitrite 12/07/2014 NEGATIVE  NEGATIVE Final  . Leukocytes, UA 12/07/2014 NEGATIVE  NEGATIVE Final   MICROSCOPIC NOT DONE ON URINES WITH NEGATIVE PROTEIN, BLOOD, LEUKOCYTES, NITRITE, OR GLUCOSE <1000 mg/dL.  . ABO/RH(D) 12/07/2014 O POS   Final     X-Rays:No results found.  EKG: Orders placed or performed during the hospital encounter of 10/07/14  . EKG 12-Lead  . EKG 12-Lead  . ED EKG (<22mns upon arrival to the ED)  . ED EKG  (<147ms upon arrival to the ED)  . EKG     Hospital Course: Jose Bush a [age].[facility]. [facility]2014 and underwent Procedure(s): LEFT TOTAL KNEE ARTHROPLASTY.  Patient tolerated the procedure well and was later transferred to the recovery room and then to the orthopaedic floor for postoperative care.  They were given PO and IV analgesics for pain control following their surgery.  They were given 24 hours of postoperative antibiotics of  Anti-infectives    Start     Dose/Rate Route Frequency Ordered Stop   12/13/14 1830  ceFAZolin (ANCEF) IVPB 2 g/50 mL premix     2 g 100 mL/hr over 30 Minutes Intravenous Every 6 hours 12/13/14 1524 12/14/14 0046   12/13/14 0928  ceFAZolin (ANCEF) IVPB 2 g/50 mL premix     2 g 100 mL/hr over 30 Minutes Intravenous On call to O.R. 12/13/14 0985923/14/16 1225     and started on DVT prophylaxis in the form of Lovenox and Coumadin.   PT and OT were ordered for total joint protocol.  Discharge planning consulted to help with postop disposition and equipment needs.  Patient had a rough night on the evening of surgery with no sleep.  They started to get up OOB with therapy on day one. Hemovac drain was pulled without difficulty.  Continued to work with therapy into day two.  Dressing was changed on day two and the incision was healing well.  Patient was seen in rounds and was ready to go home.  Discharge home with home health Diet - Cardiac diet Follow up - in 2 weeks Activity - WBAT Disposition - Home Condition Upon Discharge - Good D/C Meds - See DC Summary DVT Prophylaxis - Lovenox and Coumadin      Discharge Instructions    Call MD / Call 911    Complete by:  As directed   If you experience chest pain or shortness of breath, CALL 911 and be transported to the hospital emergency room.  If you develope a fever above 101 F, pus (white drainage) or increased drainage or redness  at the wound, or calf pain, call your surgeon's office.     Change dressing    Complete by:  As directed   Change dressing daily with sterile 4 x 4 inch gauze dressing and apply TED hose. Do not submerge the incision under water.     Constipation Prevention    Complete by:  As directed   Drink plenty of fluids.  Prune juice may be helpful.  You may use a stool softener, such as Colace (over the counter) 100 mg twice a day.  Use MiraLax (over the counter) for constipation as needed.     Diet - low sodium heart healthy    Complete by:  As directed      Discharge instructions    Complete by:  As directed   Pick up stool softner and laxative for home use following surgery while on pain medications. Do not submerge incision under water. Please use good hand washing techniques while changing dressing each day. May shower starting three days after surgery. Please use a clean towel to pat the incision dry following showers. Continue to use ice for pain and swelling after surgery. Do not use any lotions or creams on the incision until instructed by your surgeon.  Postoperative Constipation Protocol  Constipation - defined medically as fewer than three stools per week and severe constipation as less than one stool per week.  One of the most common issues patients have following surgery is constipation.  Even if you have a regular bowel pattern at home, your normal regimen is likely to be disrupted due to multiple reasons following surgery.  Combination of anesthesia, postoperative narcotics, change in appetite and fluid intake all can affect your bowels.  In order to avoid complications following surgery, here are some recommendations in order to help you during your recovery period.  Colace (docusate) - Pick up an over-the-counter form of Colace or another stool softener and take twice a day as long as you are requiring postoperative pain medications.  Take with a full glass of water daily.  If you  experience loose stools or diarrhea, hold the colace until you stool forms back up.  If your symptoms do not get better within 1 week or if they get worse, check with your doctor.  Dulcolax (bisacodyl) - Pick up over-the-counter and take as directed by the product packaging as needed to assist with the movement of your bowels.  Take with a full glass of water.  Use this product as needed if not relieved by Colace only.   MiraLax (polyethylene glycol) - Pick up over-the-counter to have on hand.  MiraLax is a solution that will increase the amount of water in your bowels to assist with bowel movements.  Take as directed and can mix with a glass of water, juice, soda, coffee, or tea.  Take if you go more than two days without a movement. Do not  use MiraLax more than once per day. Call your doctor if you are still constipated or irregular after using this medication for 7 days in a row.  If you continue to have problems with postoperative constipation, please contact the office for further assistance and recommendations.  If you experience "the worst abdominal pain ever" or develop nausea or vomiting, please contact the office immediatly for further recommendations for treatment.  Take Coumadin for three weeks for postoperative protocol and then the patient may resume their previous Coumadin home regimen.  The dose may need to be adjusted based upon the INR.  Please follow the INR and titrate Coumadin dose for a therapeutic range between 2.0 and 3.0 INR.  After completing the three weeks of Coumadin, the patient may resume their previous Coumadin home regimen.  Continue Lovenox injections until the INR is therapeutic at or greater than 2.0.  When INR reaches the therapeutic level of equal to or greater than 2.0, the patient may discontinue the Lovenox injections.     Do not put a pillow under the knee. Place it under the heel.    Complete by:  As directed      Do not sit on low chairs, stoools or toilet  seats, as it may be difficult to get up from low surfaces    Complete by:  As directed      Driving restrictions    Complete by:  As directed   No driving until released by the physician.     Increase activity slowly as tolerated    Complete by:  As directed      Lifting restrictions    Complete by:  As directed   No lifting until released by the physician.     Patient may shower    Complete by:  As directed   You may shower without a dressing once there is no drainage.  Do not wash over the wound.  If drainage remains, do not shower until drainage stops.     TED hose    Complete by:  As directed   Use stockings (TED hose) for 3 weeks on both leg(s).  You may remove them at night for sleeping.     Weight bearing as tolerated    Complete by:  As directed   Laterality:  left  Extremity:  Lower            Medication List    STOP taking these medications        CALCIUM + D PO     HYDROcodone-acetaminophen 5-325 MG per tablet  Commonly known as:  NORCO/VICODIN     hydroxychloroquine 200 MG tablet  Commonly known as:  PLAQUENIL     multivitamin with minerals Tabs tablet      TAKE these medications        acetaminophen 500 MG tablet  Commonly known as:  TYLENOL  Take 1,000 mg by mouth every 6 (six) hours as needed for moderate pain or headache.     enoxaparin 30 MG/0.3ML injection  Commonly known as:  LOVENOX  Inject 0.3 mLs (30 mg total) into the skin every 12 (twelve) hours. Continue Lovenox injections until the INR is therapeutic at or greater than 2.0.  When INR reaches the therapeutic level of equal to or greater than 2.0, the patient may discontinue the Lovenox injections.     ICY HOT EX  Apply 1 each topically daily as needed (left knee.). Patch.  Notes to Patient:  Do not  apply over incision.     lisinopril-hydrochlorothiazide 10-12.5 MG per tablet  Commonly known as:  PRINZIDE,ZESTORETIC  Take 1 tablet by mouth every morning.     methocarbamol 500 MG tablet    Commonly known as:  ROBAXIN  Take 1 tablet (500 mg total) by mouth every 6 (six) hours as needed for muscle spasms.     NITROSTAT 0.4 MG SL tablet  Generic drug:  nitroGLYCERIN  Place 0.4 mg under the tongue every 5 (five) minutes as needed for chest pain. x3 doses as needed for chest pain     omeprazole 20 MG capsule  Commonly known as:  PRILOSEC  Take 20 mg by mouth every morning.     oxyCODONE 5 MG immediate release tablet  Commonly known as:  Oxy IR/ROXICODONE  Take 1-2 tablets (5-10 mg total) by mouth every 3 (three) hours as needed for moderate pain, severe pain or breakthrough pain.     predniSONE 10 MG tablet  Commonly known as:  DELTASONE  Take 1 tablet by mouth 2 (two) times daily as needed (lupus flare up.).     tamsulosin 0.4 MG Caps capsule  Commonly known as:  FLOMAX  Take 1 capsule by mouth every morning.     traMADol 50 MG tablet  Commonly known as:  ULTRAM  Take 1-2 tablets (50-100 mg total) by mouth every 6 (six) hours as needed (mild pain).     warfarin 5 MG tablet  Commonly known as:  COUMADIN  Take 1 tablet (5 mg total) by mouth every morning. Take Coumadin for three weeks for postoperative protocol and then the patient may resume their previous Coumadin home regimen.  The dose may need to be adjusted based upon the INR.  Please follow the INR and titrate Coumadin dose for a therapeutic range between 2.0 and 3.0 INR.  After completing the three weeks of Coumadin, the patient may resume their previous Coumadin home regimen.       Follow-up Information    Follow up with [facility].   Why:  home health physical therapy and nurse for INR checks   Contact information:   [address] [place] [address] [phone]       Follow up with Gearlean Alf, MD. Schedule an appointment as soon as possible for a visit in 2 weeks.   Specialty:  Orthopedic Surgery   Why:  Call office at [phone] to setup appointment in two weeks with  Dr. Denman George information:   [address] [place] [address] [place] [address] [phone]       Signed: Arlee Muslim, PA-C Orthopaedic Surgery 12/23/2014, 10:13 AM

## 2014-12-15 NOTE — Progress Notes (Signed)
Physical Therapy Treatment Patient Details Name: Jose Bush MRN: [id number] DOB: 1941/08/20 Today's Date: 12/15/2014    History of Present Illness 74 yo male s/p L TKA    PT Comments    Pt issued illustrated handout and reviewed HEP, stairs, and use of ice with patient and wife.  Pt able to negotiate 2 stairs with rail and cane with min/guard.  Pt is scheduled to be d/c today.  Follow Up Recommendations  Home health PT     Equipment Recommendations  None recommended by PT    Recommendations for Other Services       Precautions / Restrictions Precautions Precautions: Knee Precaution Booklet Issued: Yes (comment) Precaution Comments: I SLR today, KI not used Required Braces or Orthoses: Knee Immobilizer - Left Knee Immobilizer - Left: Discontinue once straight leg raise with < 10 degree lag Restrictions Weight Bearing Restrictions: No Other Position/Activity Restrictions: WBAT    Mobility  Bed Mobility                  Transfers   Equipment used: Rolling walker (2 wheeled) Transfers: Sit to/from Stand Sit to Stand: Supervision            Ambulation/Gait Ambulation/Gait assistance: Min guard Ambulation Distance (Feet): 115 Feet Assistive device: Rolling walker (2 wheeled) Gait Pattern/deviations: Step-to pattern;Decreased stance time - left Gait velocity: decreased   General Gait Details: Cues to keep RW on ground and for heel to toe pattern.  Pt tends to walk with L LE "stiff legged"  gait pattern but with cues it improved  as gait progressed.   Stairs Stairs: Yes Stairs assistance: Min guard Stair Management: One rail Left;With cane;Step to pattern Number of Stairs: 2 General stair comments: cues for proper sequence  Wheelchair Mobility    Modified Rankin (Stroke Patients Only)       Balance                                    Cognition Arousal/Alertness: Awake/alert Behavior During Therapy: WFL for tasks  assessed/performed Overall Cognitive Status: Within Functional Limits for tasks assessed                      Exercises Total Joint Exercises Ankle Circles/Pumps: AROM;Both;10 reps Quad Sets: 10 reps;Both;Strengthening Heel Slides: AROM;Left;5 reps Hip ABduction/ADduction: AROM;Left;5 reps Straight Leg Raises: AROM;Left;10 reps    General Comments        Pertinent Vitals/Pain Pain Score: 4  Pain Location: L knee Pain Descriptors / Indicators: Sore Pain Intervention(s): Limited activity within patient's tolerance;Ice applied    Home Living                      Prior Function            PT Goals (current goals can now be found in the care plan section) Acute Rehab PT Goals Patient Stated Goal: return to independence. PT Goal Formulation: With patient Time For Goal Achievement: 12/17/14 Potential to Achieve Goals: Good Progress towards PT goals: Progressing toward goals    Frequency  7X/week    PT Plan Current plan remains appropriate    Co-evaluation             End of Session Equipment Utilized During Treatment: Gait belt Activity Tolerance: Patient tolerated treatment well Patient left: in chair;with call bell/phone within reach;with family/visitor present  Time: [id number] PT Time Calculation (min) (ACUTE ONLY): 25 min  Charges:  $Gait Training: 8-22 mins $Therapeutic Exercise: 8-22 mins                    G Codes:      Alazay Leicht LUBECK 12/15/2014, 9:57 AM

## 2014-12-15 NOTE — Discharge Instructions (Signed)
° °Dr. Frank Aluisio °Total Joint Specialist °[facility] °[address] °[place], [place] [address] °[phone] ° °TOTAL KNEE REPLACEMENT POSTOPERATIVE DIRECTIONS ° ° ° °Knee Rehabilitation, Guidelines Following Surgery  °Results after knee surgery are often greatly improved when you follow the exercise, range of motion and muscle strengthening exercises prescribed by your doctor. Safety measures are also important to protect the knee from further injury. Any time any of these exercises cause you to have increased pain or swelling in your knee joint, decrease the amount until you are comfortable again and slowly increase them. If you have problems or questions, call your caregiver or physical therapist for advice.  ° °HOME CARE INSTRUCTIONS  °Remove items at home which could result in a fall. This includes throw rugs or furniture in walking pathways.  °Continue medications as instructed at time of discharge. °You may have some home medications which will be placed on hold until you complete the course of blood thinner medication.  °You may start showering once you are discharged home but do not submerge the incision under water. Just pat the incision dry and apply a dry gauze dressing on daily. °Walk with walker as instructed.  °You may resume a sexual relationship in one month or when given the OK by  your doctor.  °· Use walker as long as suggested by your caregivers. °· Avoid periods of inactivity such as sitting longer than an hour when not asleep. This helps prevent blood clots.  °You may put full weight on your legs and walk as much as is comfortable.  °You may return to work once you are cleared by your doctor.  °Do not drive a car for 6 weeks or until released by you surgeon.  °· Do not drive while taking narcotics.  °Wear the elastic stockings for three weeks following surgery during the day but you may remove then at night. °Make sure you keep all of your appointments after your  operation with all of your doctors and caregivers. You should call the office at the above phone number and make an appointment for approximately two weeks after the date of your surgery. °Change the dressing daily and reapply a dry dressing each time. °Please pick up a stool softener and laxative for home use as long as you are requiring pain medications. °· ICE to the affected knee every three hours for 30 minutes at a time and then as needed for pain and swelling.  Continue to use ice on the knee for pain and swelling from surgery. You may notice swelling that will progress down to the foot and ankle.  This is normal after surgery.  Elevate the leg when you are not up walking on it.   °It is important for you to complete the blood thinner medication as prescribed by your doctor. °· Continue to use the breathing machine which will help keep your temperature down.  It is common for your temperature to cycle up and down following surgery, especially at night when you are not up moving around and exerting yourself.  The breathing machine keeps your lungs expanded and your temperature down. ° °RANGE OF MOTION AND STRENGTHENING EXERCISES  °Rehabilitation of the knee is important following a knee injury or an operation. After just a few days of immobilization, the muscles of the thigh which control the knee become weakened and shrink (atrophy). Knee exercises are designed to build up the tone and strength of the thigh muscles and to improve knee   motion. Often times heat used for twenty to thirty minutes before working out will loosen up your tissues and help with improving the range of motion but do not use heat for the first two weeks following surgery. These exercises can be done on a training (exercise) mat, on the floor, on a table or on a bed. Use what ever works the best and is most comfortable for you Knee exercises include:  °Leg Lifts - While your knee is still immobilized in a splint or cast, you can do  straight leg raises. Lift the leg to 60 degrees, hold for 3 sec, and slowly lower the leg. Repeat 10-20 times 2-3 times daily. Perform this exercise against resistance later as your knee gets better.  °Quad and Hamstring Sets - Tighten up the muscle on the front of the thigh (Quad) and hold for 5-10 sec. Repeat this 10-20 times hourly. Hamstring sets are done by pushing the foot backward against an object and holding for 5-10 sec. Repeat as with quad sets.  °A rehabilitation program following serious knee injuries can speed recovery and prevent re-injury in the future due to weakened muscles. Contact your doctor or a physical therapist for more information on knee rehabilitation.  ° °SKILLED REHAB INSTRUCTIONS: °If the patient is transferred to a skilled rehab facility following release from the hospital, a list of the current medications will be sent to the facility for the patient to continue.  When discharged from the skilled rehab facility, please have the facility set up the patient's Home Health Physical Therapy prior to being released. Also, the skilled facility will be responsible for providing the patient with their medications at time of release from the facility to include their pain medication, the muscle relaxants, and their blood thinner medication. If the patient is still at the rehab facility at time of the two week follow up appointment, the skilled rehab facility will also need to assist the patient in arranging follow up appointment in our office and any transportation needs. ° °MAKE SURE YOU:  °Understand these instructions.  °Will watch your condition.  °Will get help right away if you are not doing well or get worse.  ° ° °Pick up stool softner and laxative for home use following surgery while on pain medications. °Do not submerge incision under water. °Please use good hand washing techniques while changing dressing each day. °May shower starting three days after surgery. °Please use a clean  towel to pat the incision dry following showers. °Continue to use ice for pain and swelling after surgery. °Do not use any lotions or creams on the incision until instructed by your surgeon. ° °Take Coumadin for three weeks for postoperative protocol and then the patient may resume their previous Coumadin home regimen.  The dose may need to be adjusted based upon the INR.  Please follow the INR and titrate Coumadin dose for a therapeutic range between 2.0 and 3.0 INR.  After completing the three weeks of Coumadin, the patient may resume their previous Coumadin home regimen. ° °Continue Lovenox injections until the INR is therapeutic at or greater than 2.0.  When INR reaches the therapeutic level of equal to or greater than 2.0, the patient may discontinue the Lovenox injections. ° °Postoperative Constipation Protocol ° °Constipation - defined medically as fewer than three stools per week and severe constipation as less than one stool per week. ° °One of the most common issues patients have following surgery is constipation.  Even if you have   a regular bowel pattern at home, your normal regimen is likely to be disrupted due to multiple reasons following surgery.  Combination of anesthesia, postoperative narcotics, change in appetite and fluid intake all can affect your bowels.  In order to avoid complications following surgery, here are some recommendations in order to help you during your recovery period. ° °Colace (docusate) - Pick up an over-the-counter form of Colace or another stool softener and take twice a day as long as you are requiring postoperative pain medications.  Take with a full glass of water daily.  If you experience loose stools or diarrhea, hold the colace until you stool forms back up.  If your symptoms do not get better within 1 week or if they get worse, check with your doctor. ° °Dulcolax (bisacodyl) - Pick up over-the-counter and take as directed by the product packaging as needed to assist  with the movement of your bowels.  Take with a full glass of water.  Use this product as needed if not relieved by Colace only.  ° °MiraLax (polyethylene glycol) - Pick up over-the-counter to have on hand.  MiraLax is a solution that will increase the amount of water in your bowels to assist with bowel movements.  Take as directed and can mix with a glass of water, juice, soda, coffee, or tea.  Take if you go more than two days without a movement. °Do not use MiraLax more than once per day. Call your doctor if you are still constipated or irregular after using this medication for 7 days in a row. ° °If you continue to have problems with postoperative constipation, please contact the office for further assistance and recommendations.  If you experience "the worst abdominal pain ever" or develop nausea or vomiting, please contact the office immediatly for further recommendations for treatment. ° °

## 2014-12-15 NOTE — Progress Notes (Signed)
Occupational Therapy Treatment Patient Details Name: Jose Bush MRN: [id number] DOB: 1940/12/01 Today's Date: 12/15/2014    History of present illness 74 yo male s/p L TKA   OT comments  Pt moving well. For d/c today. All education completed with pt and wife. Wife able to help PRN at d/c. No further questions by pt regarding ADL.   Follow Up Recommendations  No OT follow up;Supervision/Assistance - 24 hour    Equipment Recommendations  None recommended by OT    Recommendations for Other Services      Precautions / Restrictions Precautions Precautions: Knee Precaution Booklet Issued: Yes (comment) Precaution Comments: I SLR today, KI not used Required Braces or Orthoses: Knee Immobilizer - Left Knee Immobilizer - Left: Discontinue once straight leg raise with < 10 degree lag Restrictions Weight Bearing Restrictions: No Other Position/Activity Restrictions: WBAT       Mobility Bed Mobility                  Transfers Overall transfer level: Needs assistance Equipment used: Rolling walker (2 wheeled) Transfers: Sit to/from Stand Sit to Stand: Min guard         General transfer comment: verbal cues for hand placement.    Balance                                   ADL                           Toilet Transfer: Min guard;Ambulation;RW       Tub/ Shower Transfer: Walk-in shower;Min Systems analystguard;Rolling walker     General ADL Comments: Educated pt and wife on functional transfers including toilet transfer and pt is min guard assist with walker. Min cues to not pick up the walker and for proper walker distance to self with taking steps. Practiced shower transfer and discussed placement of shower chair and how wife should assist.       Vision                     Perception     Praxis      Cognition   Behavior During Therapy: WFL for tasks assessed/performed Overall Cognitive Status: Within Functional Limits for tasks  assessed                       Extremity/Trunk Assessment                  Shoulder Instructions       General Comments      Pertinent Vitals/ Pain       Pain Assessment: 0-10 Pain Score: 6  Pain Location: L knee Pain Descriptors / Indicators: Aching Pain Intervention(s): Repositioned;Ice applied  Home Living                                          Prior Functioning/Environment              Frequency Min 2X/week     Progress Toward Goals  OT Goals(current goals can now be found in the care plan section)  Progress towards OT goals: Progressing toward goals  Acute Rehab OT Goals Patient Stated Goal: return to independence.  Plan Discharge plan remains appropriate  Co-evaluation                 End of Session Equipment Utilized During Treatment: Rolling walker CPM Left Knee CPM Left Knee: Off   Activity Tolerance Patient tolerated treatment well   Patient Left in chair;with call bell/phone within reach;with family/visitor present   Nurse Communication          Time: 1010-1026 OT Time Calculation (min): 16 min  Charges: OT General Charges $OT Visit: 1 Procedure OT Treatments $Therapeutic Activity: 8-22 mins  Lennox Laity  [phone] 12/15/2014, 10:29 AM

## 2014-12-16 DIAGNOSIS — I10 Essential (primary) hypertension: Secondary | ICD-10-CM | POA: Diagnosis not present

## 2014-12-16 DIAGNOSIS — Z96652 Presence of left artificial knee joint: Secondary | ICD-10-CM | POA: Diagnosis not present

## 2014-12-16 DIAGNOSIS — Z471 Aftercare following joint replacement surgery: Secondary | ICD-10-CM | POA: Diagnosis not present

## 2014-12-16 DIAGNOSIS — M25562 Pain in left knee: Secondary | ICD-10-CM | POA: Diagnosis not present

## 2014-12-16 DIAGNOSIS — Z7901 Long term (current) use of anticoagulants: Secondary | ICD-10-CM | POA: Diagnosis not present

## 2014-12-16 DIAGNOSIS — D6862 Lupus anticoagulant syndrome: Secondary | ICD-10-CM | POA: Diagnosis not present

## 2014-12-16 DIAGNOSIS — M1711 Unilateral primary osteoarthritis, right knee: Secondary | ICD-10-CM | POA: Diagnosis not present

## 2014-12-16 DIAGNOSIS — M329 Systemic lupus erythematosus, unspecified: Secondary | ICD-10-CM | POA: Diagnosis not present

## 2014-12-16 DIAGNOSIS — Z5181 Encounter for therapeutic drug level monitoring: Secondary | ICD-10-CM | POA: Diagnosis not present

## 2014-12-17 DIAGNOSIS — D6862 Lupus anticoagulant syndrome: Secondary | ICD-10-CM | POA: Diagnosis not present

## 2014-12-17 DIAGNOSIS — M329 Systemic lupus erythematosus, unspecified: Secondary | ICD-10-CM | POA: Diagnosis not present

## 2014-12-17 DIAGNOSIS — Z7901 Long term (current) use of anticoagulants: Secondary | ICD-10-CM | POA: Diagnosis not present

## 2014-12-17 DIAGNOSIS — Z5181 Encounter for therapeutic drug level monitoring: Secondary | ICD-10-CM | POA: Diagnosis not present

## 2014-12-17 DIAGNOSIS — Z471 Aftercare following joint replacement surgery: Secondary | ICD-10-CM | POA: Diagnosis not present

## 2014-12-17 DIAGNOSIS — Z96652 Presence of left artificial knee joint: Secondary | ICD-10-CM | POA: Diagnosis not present

## 2014-12-17 DIAGNOSIS — I10 Essential (primary) hypertension: Secondary | ICD-10-CM | POA: Diagnosis not present

## 2014-12-17 DIAGNOSIS — M25562 Pain in left knee: Secondary | ICD-10-CM | POA: Diagnosis not present

## 2014-12-17 DIAGNOSIS — M1711 Unilateral primary osteoarthritis, right knee: Secondary | ICD-10-CM | POA: Diagnosis not present

## 2014-12-18 DIAGNOSIS — M1711 Unilateral primary osteoarthritis, right knee: Secondary | ICD-10-CM | POA: Diagnosis not present

## 2014-12-18 DIAGNOSIS — I10 Essential (primary) hypertension: Secondary | ICD-10-CM | POA: Diagnosis not present

## 2014-12-18 DIAGNOSIS — Z7901 Long term (current) use of anticoagulants: Secondary | ICD-10-CM | POA: Diagnosis not present

## 2014-12-18 DIAGNOSIS — Z5181 Encounter for therapeutic drug level monitoring: Secondary | ICD-10-CM | POA: Diagnosis not present

## 2014-12-18 DIAGNOSIS — M329 Systemic lupus erythematosus, unspecified: Secondary | ICD-10-CM | POA: Diagnosis not present

## 2014-12-18 DIAGNOSIS — D6862 Lupus anticoagulant syndrome: Secondary | ICD-10-CM | POA: Diagnosis not present

## 2014-12-18 DIAGNOSIS — Z471 Aftercare following joint replacement surgery: Secondary | ICD-10-CM | POA: Diagnosis not present

## 2014-12-18 DIAGNOSIS — M25562 Pain in left knee: Secondary | ICD-10-CM | POA: Diagnosis not present

## 2014-12-18 DIAGNOSIS — Z96652 Presence of left artificial knee joint: Secondary | ICD-10-CM | POA: Diagnosis not present

## 2014-12-20 DIAGNOSIS — Z5181 Encounter for therapeutic drug level monitoring: Secondary | ICD-10-CM | POA: Diagnosis not present

## 2014-12-20 DIAGNOSIS — D6862 Lupus anticoagulant syndrome: Secondary | ICD-10-CM | POA: Diagnosis not present

## 2014-12-20 DIAGNOSIS — Z7901 Long term (current) use of anticoagulants: Secondary | ICD-10-CM | POA: Diagnosis not present

## 2014-12-20 DIAGNOSIS — I10 Essential (primary) hypertension: Secondary | ICD-10-CM | POA: Diagnosis not present

## 2014-12-20 DIAGNOSIS — Z96652 Presence of left artificial knee joint: Secondary | ICD-10-CM | POA: Diagnosis not present

## 2014-12-20 DIAGNOSIS — Z471 Aftercare following joint replacement surgery: Secondary | ICD-10-CM | POA: Diagnosis not present

## 2014-12-20 DIAGNOSIS — M1711 Unilateral primary osteoarthritis, right knee: Secondary | ICD-10-CM | POA: Diagnosis not present

## 2014-12-20 DIAGNOSIS — M25562 Pain in left knee: Secondary | ICD-10-CM | POA: Diagnosis not present

## 2014-12-20 DIAGNOSIS — M329 Systemic lupus erythematosus, unspecified: Secondary | ICD-10-CM | POA: Diagnosis not present

## 2014-12-21 DIAGNOSIS — M25562 Pain in left knee: Secondary | ICD-10-CM | POA: Diagnosis not present

## 2014-12-21 DIAGNOSIS — Z5181 Encounter for therapeutic drug level monitoring: Secondary | ICD-10-CM | POA: Diagnosis not present

## 2014-12-21 DIAGNOSIS — Z471 Aftercare following joint replacement surgery: Secondary | ICD-10-CM | POA: Diagnosis not present

## 2014-12-21 DIAGNOSIS — I10 Essential (primary) hypertension: Secondary | ICD-10-CM | POA: Diagnosis not present

## 2014-12-21 DIAGNOSIS — M329 Systemic lupus erythematosus, unspecified: Secondary | ICD-10-CM | POA: Diagnosis not present

## 2014-12-21 DIAGNOSIS — M1711 Unilateral primary osteoarthritis, right knee: Secondary | ICD-10-CM | POA: Diagnosis not present

## 2014-12-21 DIAGNOSIS — Z96652 Presence of left artificial knee joint: Secondary | ICD-10-CM | POA: Diagnosis not present

## 2014-12-21 DIAGNOSIS — Z7901 Long term (current) use of anticoagulants: Secondary | ICD-10-CM | POA: Diagnosis not present

## 2014-12-21 DIAGNOSIS — D6862 Lupus anticoagulant syndrome: Secondary | ICD-10-CM | POA: Diagnosis not present

## 2014-12-23 DIAGNOSIS — M329 Systemic lupus erythematosus, unspecified: Secondary | ICD-10-CM | POA: Diagnosis not present

## 2014-12-23 DIAGNOSIS — M1711 Unilateral primary osteoarthritis, right knee: Secondary | ICD-10-CM | POA: Diagnosis not present

## 2014-12-23 DIAGNOSIS — I10 Essential (primary) hypertension: Secondary | ICD-10-CM | POA: Diagnosis not present

## 2014-12-23 DIAGNOSIS — Z471 Aftercare following joint replacement surgery: Secondary | ICD-10-CM | POA: Diagnosis not present

## 2014-12-23 DIAGNOSIS — Z5181 Encounter for therapeutic drug level monitoring: Secondary | ICD-10-CM | POA: Diagnosis not present

## 2014-12-23 DIAGNOSIS — Z96652 Presence of left artificial knee joint: Secondary | ICD-10-CM | POA: Diagnosis not present

## 2014-12-23 DIAGNOSIS — D6862 Lupus anticoagulant syndrome: Secondary | ICD-10-CM | POA: Diagnosis not present

## 2014-12-23 DIAGNOSIS — M25562 Pain in left knee: Secondary | ICD-10-CM | POA: Diagnosis not present

## 2014-12-23 DIAGNOSIS — Z7901 Long term (current) use of anticoagulants: Secondary | ICD-10-CM | POA: Diagnosis not present

## 2014-12-24 DIAGNOSIS — Z5181 Encounter for therapeutic drug level monitoring: Secondary | ICD-10-CM | POA: Diagnosis not present

## 2014-12-24 DIAGNOSIS — Z7901 Long term (current) use of anticoagulants: Secondary | ICD-10-CM | POA: Diagnosis not present

## 2014-12-24 DIAGNOSIS — Z471 Aftercare following joint replacement surgery: Secondary | ICD-10-CM | POA: Diagnosis not present

## 2014-12-24 DIAGNOSIS — M329 Systemic lupus erythematosus, unspecified: Secondary | ICD-10-CM | POA: Diagnosis not present

## 2014-12-24 DIAGNOSIS — M1711 Unilateral primary osteoarthritis, right knee: Secondary | ICD-10-CM | POA: Diagnosis not present

## 2014-12-24 DIAGNOSIS — D6862 Lupus anticoagulant syndrome: Secondary | ICD-10-CM | POA: Diagnosis not present

## 2014-12-24 DIAGNOSIS — Z96652 Presence of left artificial knee joint: Secondary | ICD-10-CM | POA: Diagnosis not present

## 2014-12-24 DIAGNOSIS — M25562 Pain in left knee: Secondary | ICD-10-CM | POA: Diagnosis not present

## 2014-12-24 DIAGNOSIS — I10 Essential (primary) hypertension: Secondary | ICD-10-CM | POA: Diagnosis not present

## 2014-12-27 DIAGNOSIS — M329 Systemic lupus erythematosus, unspecified: Secondary | ICD-10-CM | POA: Diagnosis not present

## 2014-12-27 DIAGNOSIS — Z7901 Long term (current) use of anticoagulants: Secondary | ICD-10-CM | POA: Diagnosis not present

## 2014-12-27 DIAGNOSIS — Z5181 Encounter for therapeutic drug level monitoring: Secondary | ICD-10-CM | POA: Diagnosis not present

## 2014-12-27 DIAGNOSIS — M25562 Pain in left knee: Secondary | ICD-10-CM | POA: Diagnosis not present

## 2014-12-27 DIAGNOSIS — Z96652 Presence of left artificial knee joint: Secondary | ICD-10-CM | POA: Diagnosis not present

## 2014-12-27 DIAGNOSIS — M1711 Unilateral primary osteoarthritis, right knee: Secondary | ICD-10-CM | POA: Diagnosis not present

## 2014-12-27 DIAGNOSIS — D6862 Lupus anticoagulant syndrome: Secondary | ICD-10-CM | POA: Diagnosis not present

## 2014-12-27 DIAGNOSIS — I10 Essential (primary) hypertension: Secondary | ICD-10-CM | POA: Diagnosis not present

## 2014-12-27 DIAGNOSIS — Z471 Aftercare following joint replacement surgery: Secondary | ICD-10-CM | POA: Diagnosis not present

## 2014-12-29 DIAGNOSIS — Z5181 Encounter for therapeutic drug level monitoring: Secondary | ICD-10-CM | POA: Diagnosis not present

## 2014-12-29 DIAGNOSIS — M25562 Pain in left knee: Secondary | ICD-10-CM | POA: Diagnosis not present

## 2014-12-29 DIAGNOSIS — M1711 Unilateral primary osteoarthritis, right knee: Secondary | ICD-10-CM | POA: Diagnosis not present

## 2014-12-29 DIAGNOSIS — D6862 Lupus anticoagulant syndrome: Secondary | ICD-10-CM | POA: Diagnosis not present

## 2014-12-29 DIAGNOSIS — Z471 Aftercare following joint replacement surgery: Secondary | ICD-10-CM | POA: Diagnosis not present

## 2014-12-29 DIAGNOSIS — M329 Systemic lupus erythematosus, unspecified: Secondary | ICD-10-CM | POA: Diagnosis not present

## 2014-12-29 DIAGNOSIS — I10 Essential (primary) hypertension: Secondary | ICD-10-CM | POA: Diagnosis not present

## 2014-12-29 DIAGNOSIS — Z7901 Long term (current) use of anticoagulants: Secondary | ICD-10-CM | POA: Diagnosis not present

## 2014-12-29 DIAGNOSIS — Z96652 Presence of left artificial knee joint: Secondary | ICD-10-CM | POA: Diagnosis not present

## 2014-12-30 DIAGNOSIS — M25562 Pain in left knee: Secondary | ICD-10-CM | POA: Diagnosis not present

## 2014-12-30 DIAGNOSIS — M329 Systemic lupus erythematosus, unspecified: Secondary | ICD-10-CM | POA: Diagnosis not present

## 2014-12-30 DIAGNOSIS — Z5181 Encounter for therapeutic drug level monitoring: Secondary | ICD-10-CM | POA: Diagnosis not present

## 2014-12-30 DIAGNOSIS — Z7901 Long term (current) use of anticoagulants: Secondary | ICD-10-CM | POA: Diagnosis not present

## 2014-12-30 DIAGNOSIS — I10 Essential (primary) hypertension: Secondary | ICD-10-CM | POA: Diagnosis not present

## 2014-12-30 DIAGNOSIS — Z96652 Presence of left artificial knee joint: Secondary | ICD-10-CM | POA: Diagnosis not present

## 2014-12-30 DIAGNOSIS — D6862 Lupus anticoagulant syndrome: Secondary | ICD-10-CM | POA: Diagnosis not present

## 2014-12-30 DIAGNOSIS — Z471 Aftercare following joint replacement surgery: Secondary | ICD-10-CM | POA: Diagnosis not present

## 2014-12-30 DIAGNOSIS — M1711 Unilateral primary osteoarthritis, right knee: Secondary | ICD-10-CM | POA: Diagnosis not present

## 2014-12-31 DIAGNOSIS — Z471 Aftercare following joint replacement surgery: Secondary | ICD-10-CM | POA: Diagnosis not present

## 2014-12-31 DIAGNOSIS — M25562 Pain in left knee: Secondary | ICD-10-CM | POA: Diagnosis not present

## 2014-12-31 DIAGNOSIS — Z7901 Long term (current) use of anticoagulants: Secondary | ICD-10-CM | POA: Diagnosis not present

## 2014-12-31 DIAGNOSIS — M329 Systemic lupus erythematosus, unspecified: Secondary | ICD-10-CM | POA: Diagnosis not present

## 2014-12-31 DIAGNOSIS — Z96652 Presence of left artificial knee joint: Secondary | ICD-10-CM | POA: Diagnosis not present

## 2014-12-31 DIAGNOSIS — Z5181 Encounter for therapeutic drug level monitoring: Secondary | ICD-10-CM | POA: Diagnosis not present

## 2014-12-31 DIAGNOSIS — M1711 Unilateral primary osteoarthritis, right knee: Secondary | ICD-10-CM | POA: Diagnosis not present

## 2014-12-31 DIAGNOSIS — D6862 Lupus anticoagulant syndrome: Secondary | ICD-10-CM | POA: Diagnosis not present

## 2014-12-31 DIAGNOSIS — I10 Essential (primary) hypertension: Secondary | ICD-10-CM | POA: Diagnosis not present

## 2015-01-04 DIAGNOSIS — Z471 Aftercare following joint replacement surgery: Secondary | ICD-10-CM | POA: Diagnosis not present

## 2015-01-04 DIAGNOSIS — Z96652 Presence of left artificial knee joint: Secondary | ICD-10-CM | POA: Diagnosis not present

## 2015-01-04 DIAGNOSIS — R2689 Other abnormalities of gait and mobility: Secondary | ICD-10-CM | POA: Diagnosis not present

## 2015-01-06 DIAGNOSIS — I82499 Acute embolism and thrombosis of other specified deep vein of unspecified lower extremity: Secondary | ICD-10-CM | POA: Diagnosis not present

## 2015-01-07 DIAGNOSIS — R2689 Other abnormalities of gait and mobility: Secondary | ICD-10-CM | POA: Diagnosis not present

## 2015-01-07 DIAGNOSIS — Z471 Aftercare following joint replacement surgery: Secondary | ICD-10-CM | POA: Diagnosis not present

## 2015-01-07 DIAGNOSIS — Z96652 Presence of left artificial knee joint: Secondary | ICD-10-CM | POA: Diagnosis not present

## 2015-01-11 DIAGNOSIS — R2689 Other abnormalities of gait and mobility: Secondary | ICD-10-CM | POA: Diagnosis not present

## 2015-01-11 DIAGNOSIS — Z471 Aftercare following joint replacement surgery: Secondary | ICD-10-CM | POA: Diagnosis not present

## 2015-01-11 DIAGNOSIS — Z96652 Presence of left artificial knee joint: Secondary | ICD-10-CM | POA: Diagnosis not present

## 2015-01-13 DIAGNOSIS — R2689 Other abnormalities of gait and mobility: Secondary | ICD-10-CM | POA: Diagnosis not present

## 2015-01-13 DIAGNOSIS — Z471 Aftercare following joint replacement surgery: Secondary | ICD-10-CM | POA: Diagnosis not present

## 2015-01-13 DIAGNOSIS — Z96652 Presence of left artificial knee joint: Secondary | ICD-10-CM | POA: Diagnosis not present

## 2015-01-18 DIAGNOSIS — M1711 Unilateral primary osteoarthritis, right knee: Secondary | ICD-10-CM | POA: Diagnosis not present

## 2015-01-18 DIAGNOSIS — Z471 Aftercare following joint replacement surgery: Secondary | ICD-10-CM | POA: Diagnosis not present

## 2015-01-18 DIAGNOSIS — Z96652 Presence of left artificial knee joint: Secondary | ICD-10-CM | POA: Diagnosis not present

## 2015-01-18 DIAGNOSIS — R2689 Other abnormalities of gait and mobility: Secondary | ICD-10-CM | POA: Diagnosis not present

## 2015-01-21 DIAGNOSIS — Z96652 Presence of left artificial knee joint: Secondary | ICD-10-CM | POA: Diagnosis not present

## 2015-01-21 DIAGNOSIS — Z471 Aftercare following joint replacement surgery: Secondary | ICD-10-CM | POA: Diagnosis not present

## 2015-01-21 DIAGNOSIS — R2689 Other abnormalities of gait and mobility: Secondary | ICD-10-CM | POA: Diagnosis not present

## 2015-01-25 DIAGNOSIS — I82499 Acute embolism and thrombosis of other specified deep vein of unspecified lower extremity: Secondary | ICD-10-CM | POA: Diagnosis not present

## 2015-01-25 DIAGNOSIS — M329 Systemic lupus erythematosus, unspecified: Secondary | ICD-10-CM | POA: Diagnosis not present

## 2015-02-01 DIAGNOSIS — I82499 Acute embolism and thrombosis of other specified deep vein of unspecified lower extremity: Secondary | ICD-10-CM | POA: Diagnosis not present

## 2015-02-18 ENCOUNTER — Ambulatory Visit: Payer: Self-pay | Admitting: Orthopedic Surgery

## 2015-02-18 NOTE — H&P (Signed)
Jose Bush DOB: 05/13/1941 Married / Language: English / Race: White Male Date of Admission: 03/21/2015 CC: Right Knee Pain History of Present Illness  The patient is a 73 year old male who comes in today for a preoperative History and Physical. The patient is scheduled for a right total knee arthroplasty to be performed by Dr. Frank V. Aluisio, MD on 03-21-2015. The patient is a 73 year old male presenting for a post-operative visit and his H&P for the upcoming surgery. The patient comes in now 9 weeks out from left total knee arthroplasty. The patient states that he is doing very well at this time. The pain is under excellent control at this time and describe their pain as mild and mostly on the inner side. They are currently on Tylenol for their pain. He was completed his outpatient physical therapy. The patient feels that they are progressing well at this time. He is having more pain in the right knee than the left. He is really pleased with how his left knee is doing. The right knee is the main thing giving him trouble. He feels good about the left knee and is looking forward to getting the right knee operated on at this time. He is ready to proceed at this time. They have been treated conservatively in the past for the above stated problem and despite conservative measures, they continue to have progressive pain and severe functional limitations and dysfunction. They have failed non-operative management including home exercise, medications, and injections. It is felt that they would benefit from undergoing total joint replacement. Risks and benefits of the procedure have been discussed with the patient and they elect to proceed with surgery. There are no active contraindications to surgery such as ongoing infection or rapidly progressive neurological disease.   Problem List/Past Medical  Lupus (M32.9) Primary osteoarthritis of left knee (M17.12) Aftercare following left knee joint  replacement surgery (Z47.1) Status post total left knee replacement (Z96.652) Tinnitus Hiatal Hernia Shingles Ulcer disease Cerebrovascular Accident Lupus Anticoagulant Disorder Blood Clot DVT Left Leg High blood pressure Gastroesophageal Reflux Disease Heart murmur  Allergies  No Known Drug Allergies  Family History Cerebrovascular Accident Mother. Congestive Heart Failure Father.  Social History  Exercise Exercises weekly; does running / walking and other Children 2 Former drinker 11/23/2013: In the past drank Current work status retired Tobacco use Former smoker. 11/23/2013: smoke(d) 1/2 pack(s) per day Not under pain contract Advance Directives Living Will, Healthcare POA Marital status married Tobacco / smoke exposure 11/23/2013: no No history of drug/alcohol rehab Living situation live with spouse  Medication History Tylenol (500MG Capsule, Oral) Active. Flomax (0.4MG Capsule, Oral) Active. Lisinopril-Hydrochlorothiazide (10-12.5MG Tablet, Oral) Active. Omeprazole (20MG Capsule DR, Oral) Active. Warfarin Sodium (5MG Tablet, Oral) Active. Nasal Allergy Control (5.2MG/ACT Aerosol Soln, Nasal as needed) Active. Hydroxychloroquine Sulfate (200MG Tablet, Oral) Active. Aspirin EC (81MG Tablet DR, Oral) Active.  Past Surgical History Inguinal Hernia Repair Date: 2010. laparoscopic: right Vena Cava Filter Placement Date: 01/2013. Arthroscopy of Knee Date: 2007. right Total Knee Replacement - Left Date: 2016.   Review of Systems General Not Present- Chills, Fatigue, Fever, Memory Loss, Night Sweats, Weight Gain and Weight Loss. Skin Not Present- Eczema, Hives, Itching, Lesions and Rash. HEENT Not Present- Dentures, Double Vision, Headache, Hearing Loss, Tinnitus and Visual Loss. Respiratory Not Present- Allergies, Chronic Cough, Coughing up blood, Shortness of breath at rest and Shortness of breath with exertion. Cardiovascular  Not Present- Chest Pain, Difficulty Breathing Lying Down, Murmur, Palpitations, Racing/skipping   heartbeats and Swelling. Gastrointestinal Not Present- Abdominal Pain, Bloody Stool, Constipation, Diarrhea, Difficulty Swallowing, Heartburn, Jaundice, Loss of appetitie, Nausea and Vomiting. Male Genitourinary Not Present- Blood in Urine, Discharge, Flank Pain, Incontinence, Painful Urination, Urgency, Urinary frequency, Urinary Retention, Urinating at Night and Weak urinary stream. Musculoskeletal Present- Joint Pain and Muscle Weakness. Not Present- Back Pain, Joint Swelling, Morning Stiffness, Muscle Pain and Spasms. Neurological Not Present- Blackout spells, Difficulty with balance, Dizziness, Paralysis, Tremor and Weakness. Psychiatric Not Present- Insomnia.   Vitals Weight: 200 lb Height: 68in Body Surface Area: 2.04 m Body Mass Index: 30.41 kg/m  BP: 128/62 (Sitting, Right Arm, Standard)  Physical Exam General Mental Status -Alert, cooperative and good historian. General Appearance-pleasant, Not in acute distress. Orientation-Oriented X3. Build & Nutrition-Well nourished and Well developed.  Head and Neck Head-normocephalic, atraumatic . Neck Global Assessment - supple, no bruit auscultated on the right, no bruit auscultated on the left. Note: upper and lower denture plates   Eye Vision-Wears corrective lenses. Pupil - Bilateral-Regular and Round. Motion - Bilateral-EOMI.  Chest and Lung Exam Auscultation Breath sounds - clear at anterior chest wall and clear at posterior chest wall. Adventitious sounds - No Adventitious sounds.  Cardiovascular Auscultation Rhythm - Regular rate and rhythm. Heart Sounds - S1 WNL and S2 WNL. Murmurs & Other Heart Sounds - Auscultation of the heart reveals - No Murmurs.  Abdomen Palpation/Percussion Tenderness - Abdomen is non-tender to palpation. Rigidity (guarding) - Abdomen is soft. Auscultation Auscultation  of the abdomen reveals - Bowel sounds normal.  Male Genitourinary Note: Not done, not pertinent to present illness   Musculoskeletal Note: Well-developed male, in no distress. His left knee looks excellent. There is no swelling. His range is 5 to 120. There is no tenderness or instability. Incision has healed well. Right knee marked crepitus on range of motion range 10 to 125, tender medial greater than lateral, no instability.  RADIOGRAPHS AP and lateral of the left knee shows prosthesis to be in excellent position with no periprosthetic abnormalities.  Assessment & Plan Primary osteoarthritis of right knee (M17.11) Status post total left knee replacement (Z96.652) Note:Surgical Plans: Right Total Knee Replacement  Disposition: Home  PCP: Dr. Kevin Howard  Topical TXA  Anesthesia Issues: None  Signed electronically by Alexzandrew L Perkins, III PA-C 

## 2015-02-18 NOTE — H&P (Signed)
Jose Bush DOB: 03/06/1941 Married / Language: English / Race: White Male Date of Admission: 03/21/2015 CC: Right Knee Pain History of Present Illness  The patient is a 74 year old male who comes in today for a preoperative History and Physical. The patient is scheduled for a right total knee arthroplasty to be performed by Dr. Gus RankinFrank V. Aluisio, MD on 03-21-2015. The patient is a 74 year old male presenting for a post-operative visit and his H&P for the upcoming surgery. The patient comes in now 9 weeks out from left total knee arthroplasty. The patient states that he is doing very well at this time. The pain is under excellent control at this time and describe their pain as mild and mostly on the inner side. They are currently on Tylenol for their pain. He was completed his outpatient physical therapy. The patient feels that they are progressing well at this time. He is having more pain in the right knee than the left. He is really pleased with how his left knee is doing. The right knee is the main thing giving him trouble. He feels good about the left knee and is looking forward to getting the right knee operated on at this time. He is ready to proceed at this time. They have been treated conservatively in the past for the above stated problem and despite conservative measures, they continue to have progressive pain and severe functional limitations and dysfunction. They have failed non-operative management including home exercise, medications, and injections. It is felt that they would benefit from undergoing total joint replacement. Risks and benefits of the procedure have been discussed with the patient and they elect to proceed with surgery. There are no active contraindications to surgery such as ongoing infection or rapidly progressive neurological disease.   Problem List/Past Medical  Lupus (M32.9) Primary osteoarthritis of left knee (M17.12) Aftercare following left knee joint  replacement surgery (Z47.1) Status post total left knee replacement (E45.409(Z96.652) Tinnitus Hiatal Hernia Shingles Ulcer disease Cerebrovascular Accident Lupus Anticoagulant Disorder Blood Clot DVT Left Leg High blood pressure Gastroesophageal Reflux Disease Heart murmur  Allergies  No Known Drug Allergies  Family History Cerebrovascular Accident Mother. Congestive Heart Failure Father.  Social History  Exercise Exercises weekly; does running / walking and other Children 2 Former drinker 11/23/2013: In the past drank Current work status retired Tobacco use Former smoker. 11/23/2013: smoke(d) 1/2 pack(s) per day Not under pain contract Advance Directives Living Will, Healthcare POA Marital status married Tobacco / smoke exposure 11/23/2013: no No history of drug/alcohol rehab Living situation live with spouse  Medication History Tylenol (500MG  Capsule, Oral) Active. Flomax (0.4MG  Capsule, Oral) Active. Lisinopril-Hydrochlorothiazide (10-12.5MG  Tablet, Oral) Active. Omeprazole (20MG  Capsule DR, Oral) Active. Warfarin Sodium (5MG  Tablet, Oral) Active. Nasal Allergy Control (5.2MG /ACT Aerosol Soln, Nasal as needed) Active. Hydroxychloroquine Sulfate (200MG  Tablet, Oral) Active. Aspirin EC (81MG  Tablet DR, Oral) Active.  Past Surgical History Inguinal Hernia Repair Date: 2010. laparoscopic: right Vena Cava Filter Placement Date: 01/2013. Arthroscopy of Knee Date: 2007. right Total Knee Replacement - Left Date: 2016.   Review of Systems General Not Present- Chills, Fatigue, Fever, Memory Loss, Night Sweats, Weight Gain and Weight Loss. Skin Not Present- Eczema, Hives, Itching, Lesions and Rash. HEENT Not Present- Dentures, Double Vision, Headache, Hearing Loss, Tinnitus and Visual Loss. Respiratory Not Present- Allergies, Chronic Cough, Coughing up blood, Shortness of breath at rest and Shortness of breath with exertion. Cardiovascular  Not Present- Chest Pain, Difficulty Breathing Lying Down, Murmur, Palpitations, Racing/skipping  heartbeats and Swelling. Gastrointestinal Not Present- Abdominal Pain, Bloody Stool, Constipation, Diarrhea, Difficulty Swallowing, Heartburn, Jaundice, Loss of appetitie, Nausea and Vomiting. Male Genitourinary Not Present- Blood in Urine, Discharge, Flank Pain, Incontinence, Painful Urination, Urgency, Urinary frequency, Urinary Retention, Urinating at Night and Weak urinary stream. Musculoskeletal Present- Joint Pain and Muscle Weakness. Not Present- Back Pain, Joint Swelling, Morning Stiffness, Muscle Pain and Spasms. Neurological Not Present- Blackout spells, Difficulty with balance, Dizziness, Paralysis, Tremor and Weakness. Psychiatric Not Present- Insomnia.   Vitals Weight: 200 lb Height: 68in Body Surface Area: 2.04 m Body Mass Index: 30.41 kg/m  BP: 128/62 (Sitting, Right Arm, Standard)  Physical Exam General Mental Status -Alert, cooperative and good historian. General Appearance-pleasant, Not in acute distress. Orientation-Oriented X3. Build & Nutrition-Well nourished and Well developed.  Head and Neck Head-normocephalic, atraumatic . Neck Global Assessment - supple, no bruit auscultated on the right, no bruit auscultated on the left. Note: upper and lower denture plates   Eye Vision-Wears corrective lenses. Pupil - Bilateral-Regular and Round. Motion - Bilateral-EOMI.  Chest and Lung Exam Auscultation Breath sounds - clear at anterior chest wall and clear at posterior chest wall. Adventitious sounds - No Adventitious sounds.  Cardiovascular Auscultation Rhythm - Regular rate and rhythm. Heart Sounds - S1 WNL and S2 WNL. Murmurs & Other Heart Sounds - Auscultation of the heart reveals - No Murmurs.  Abdomen Palpation/Percussion Tenderness - Abdomen is non-tender to palpation. Rigidity (guarding) - Abdomen is soft. Auscultation Auscultation  of the abdomen reveals - Bowel sounds normal.  Male Genitourinary Note: Not done, not pertinent to present illness   Musculoskeletal Note: Well-developed male, in no distress. His left knee looks excellent. There is no swelling. His range is 5 to 120. There is no tenderness or instability. Incision has healed well. Right knee marked crepitus on range of motion range 10 to 125, tender medial greater than lateral, no instability.  RADIOGRAPHS AP and lateral of the left knee shows prosthesis to be in excellent position with no periprosthetic abnormalities.  Assessment & Plan Primary osteoarthritis of right knee (M17.11) Status post total left knee replacement (Y78.295(Z96.652) Note:Surgical Plans: Right Total Knee Replacement  Disposition: Home  PCP: Dr. Selinda FlavinKevin Howard  Topical TXA  Anesthesia Issues: None  Signed electronically by Lauraine RinneAlexzandrew L Allysia Ingles, III PA-C

## 2015-03-02 DIAGNOSIS — G629 Polyneuropathy, unspecified: Secondary | ICD-10-CM | POA: Diagnosis not present

## 2015-03-02 DIAGNOSIS — I82499 Acute embolism and thrombosis of other specified deep vein of unspecified lower extremity: Secondary | ICD-10-CM | POA: Diagnosis not present

## 2015-03-02 DIAGNOSIS — M1711 Unilateral primary osteoarthritis, right knee: Secondary | ICD-10-CM | POA: Diagnosis not present

## 2015-03-02 DIAGNOSIS — I10 Essential (primary) hypertension: Secondary | ICD-10-CM | POA: Diagnosis not present

## 2015-03-08 ENCOUNTER — Ambulatory Visit: Payer: Self-pay | Admitting: Orthopedic Surgery

## 2015-03-08 NOTE — Progress Notes (Signed)
Please put orders in Epic surgery 03-21-15 pre op 03-14-15 Thanks 

## 2015-03-14 ENCOUNTER — Encounter (HOSPITAL_COMMUNITY): Payer: Self-pay

## 2015-03-14 ENCOUNTER — Encounter (HOSPITAL_COMMUNITY)
Admission: RE | Admit: 2015-03-14 | Discharge: 2015-03-14 | Disposition: A | Discharge status: Home / Self Care | Payer: Commercial Managed Care - HMO | Source: Ambulatory Visit | Attending: Orthopedic Surgery | Admitting: Orthopedic Surgery

## 2015-03-14 DIAGNOSIS — Z0183 Encounter for blood typing: Secondary | ICD-10-CM | POA: Insufficient documentation

## 2015-03-14 DIAGNOSIS — M179 Osteoarthritis of knee, unspecified: Secondary | ICD-10-CM | POA: Diagnosis not present

## 2015-03-14 DIAGNOSIS — Z01812 Encounter for preprocedural laboratory examination: Secondary | ICD-10-CM | POA: Insufficient documentation

## 2015-03-14 HISTORY — DX: Personal history of other diseases of the digestive system: Z87.19

## 2015-03-14 HISTORY — DX: Zoster without complications: B02.9

## 2015-03-14 HISTORY — DX: Tinnitus, unspecified ear: H93.19

## 2015-03-14 HISTORY — DX: Non-pressure chronic ulcer of skin of other sites with unspecified severity: L98.499

## 2015-03-14 HISTORY — DX: Cardiac murmur, unspecified: R01.1

## 2015-03-14 LAB — COMPREHENSIVE METABOLIC PANEL
ALT: 30 U/L (ref 17–63)
AST: 33 U/L (ref 15–41)
Albumin: 4 g/dL (ref 3.5–5.0)
Alkaline Phosphatase: 56 U/L (ref 38–126)
Anion gap: 5 (ref 5–15)
BUN: 15 mg/dL (ref 6–20)
CALCIUM: 9.2 mg/dL (ref 8.9–10.3)
CO2: 27 mmol/L (ref 22–32)
CREATININE: 0.73 mg/dL (ref 0.61–1.24)
Chloride: 109 mmol/L (ref 101–111)
GFR calc non Af Amer: >60 mL/min (ref >60)
GLUCOSE: 75 mg/dL (ref 65–99)
Potassium: 4.2 mmol/L (ref 3.5–5.1)
Sodium: 141 mmol/L (ref 135–145)
Total Bilirubin: 0.5 mg/dL (ref 0.3–1.2)
Total Protein: 6.9 g/dL (ref 6.5–8.1)

## 2015-03-14 LAB — URINALYSIS, ROUTINE W REFLEX MICROSCOPIC
BILIRUBIN URINE: NEGATIVE
GLUCOSE, UA: NEGATIVE mg/dL
HGB URINE DIPSTICK: NEGATIVE
Ketones, ur: NEGATIVE mg/dL
Leukocytes, UA: NEGATIVE
Nitrite: NEGATIVE
Protein, ur: NEGATIVE mg/dL
SPECIFIC GRAVITY, URINE: 1.01 (ref 1.005–1.030)
Urobilinogen, UA: 0.2 mg/dL (ref 0.0–1.0)
pH: 6 (ref 5.0–8.0)

## 2015-03-14 LAB — CBC
HEMATOCRIT: 37.3 % — AB (ref 39.0–52.0)
HEMOGLOBIN: 12 g/dL — AB (ref 13.0–17.0)
MCH: 27.8 pg (ref 26.0–34.0)
MCHC: 32.2 g/dL (ref 30.0–36.0)
MCV: 86.5 fL (ref 78.0–100.0)
PLATELETS: 181 10*3/uL (ref 150–400)
RBC: 4.31 MIL/uL (ref 4.22–5.81)
RDW: 14.9 % (ref 11.5–15.5)
WBC: 5.3 10*3/uL (ref 4.0–10.5)

## 2015-03-14 LAB — PROTIME-INR
INR: 2.34 — AB (ref 0.00–1.49)
PROTHROMBIN TIME: 25.4 s — AB (ref 11.6–15.2)

## 2015-03-14 LAB — SURGICAL PCR SCREEN
MRSA, PCR: NEGATIVE
STAPHYLOCOCCUS AUREUS: NEGATIVE

## 2015-03-14 LAB — APTT: APTT: 43 s — AB (ref 24–37)

## 2015-03-14 NOTE — Progress Notes (Signed)
PT and PTT results in epic per PAT visit 03/14/2015 sent to Dr Lequita Halt

## 2015-03-14 NOTE — Patient Instructions (Signed)
DIMARI CONFER  03/14/2015   Your procedure is scheduled on: Monday March 21, 2015   Report to [facility] Main  Entrance and follow signs to               Short Stay Center arrive at 5:00 AM.  Call this number if you have problems the morning of surgery [phone]   Remember: ONLY 1 PERSON MAY GO WITH YOU TO SHORT STAY TO GET  READY MORNING OF YOUR SURGERY.  Do not eat food or drink liquids :After Midnight.     Take these medicines the morning of surgery with A SIP OF WATER: Omeprazole (Prilosec)                               You may not have any metal on your body including hair pins and              piercings  Do not wear jewelry,colognes, lotions, powders or  deodorant             Men may shave face and neck.   Do not bring valuables to the hospital. [facility] IS NOT             RESPONSIBLE   FOR VALUABLES.  Contacts, dentures or bridgework may not be worn into surgery.  Leave suitcase in the car. After surgery it may be brought to your room.                  Please read over the following fact sheets you were given:MRSA FACT SHEET;INCENTIVE SPIROMETER;BLOOD TRANSFUSION FACT SHEET              DO NOT REMOVE BLUE BLOOD BRACLET AFTER BEING PLACED ON WRIST TILL AFTER SURGERY _____________________________________________________________________             [facility] - Preparing for Surgery Before surgery, you can play an important role.  Because skin is not sterile, your skin needs to be as free of germs as possible.  You can reduce the number of germs on your skin by washing with CHG (chlorahexidine gluconate) soap before surgery.  CHG is an antiseptic cleaner which kills germs and bonds with the skin to continue killing germs even after washing. Please DO NOT use if you have an allergy to CHG or antibacterial soaps.  If your skin becomes reddened/irritated stop using the CHG and inform your nurse when you arrive at Short Stay. Do not shave  (including legs and underarms) for at least 48 hours prior to the first CHG shower.  You may shave your face/neck. Please follow these instructions carefully:  1.  Shower with CHG Soap the night before surgery and the  morning of Surgery.  2.  If you choose to wash your hair, wash your hair first as usual with your  normal  shampoo.  3.  After you shampoo, rinse your hair and body thoroughly to remove the  shampoo.                           4.  Use CHG as you would any other liquid soap.  You can apply chg directly  to the skin and wash  Gently with a scrungie or clean washcloth.  5.  Apply the CHG Soap to your body ONLY FROM THE NECK DOWN.   Do not use on face/ open                           Wound or open sores. Avoid contact with eyes, ears mouth and genitals (private parts).                       Wash face,  Genitals (private parts) with your normal soap.             6.  Wash thoroughly, paying special attention to the area where your surgery  will be performed.  7.  Thoroughly rinse your body with warm water from the neck down.  8.  DO NOT shower/wash with your normal soap after using and rinsing off  the CHG Soap.                9.  Pat yourself dry with a clean towel.            10.  Wear clean pajamas.            11.  Place clean sheets on your bed the night of your first shower and do not  sleep with pets. Day of Surgery : Do not apply any lotions/deodorants the morning of surgery.  Please wear clean clothes to the hospital/surgery center.  FAILURE TO FOLLOW THESE INSTRUCTIONS MAY RESULT IN THE CANCELLATION OF YOUR SURGERY PATIENT SIGNATURE_________________________________  NURSE SIGNATURE__________________________________  ________________________________________________________________________   Adam Phenix  An incentive spirometer is a tool that can help keep your lungs clear and active. This tool measures how well you are filling your lungs with  each breath. Taking long deep breaths may help reverse or decrease the chance of developing breathing (pulmonary) problems (especially infection) following:  A long period of time when you are unable to move or be active. BEFORE THE PROCEDURE   If the spirometer includes an indicator to show your best effort, your nurse or respiratory therapist will set it to a desired goal.  If possible, sit up straight or lean slightly forward. Try not to slouch.  Hold the incentive spirometer in an upright position. INSTRUCTIONS FOR USE   Sit on the edge of your bed if possible, or sit up as far as you can in bed or on a chair.  Hold the incentive spirometer in an upright position.  Breathe out normally.  Place the mouthpiece in your mouth and seal your lips tightly around it.  Breathe in slowly and as deeply as possible, raising the piston or the ball toward the top of the column.  Hold your breath for 3-5 seconds or for as long as possible. Allow the piston or ball to fall to the bottom of the column.  Remove the mouthpiece from your mouth and breathe out normally.  Rest for a few seconds and repeat Steps 1 through 7 at least 10 times every 1-2 hours when you are awake. Take your time and take a few normal breaths between deep breaths.  The spirometer may include an indicator to show your best effort. Use the indicator as a goal to work toward during each repetition.  After each set of 10 deep breaths, practice coughing to be sure your lungs are clear. If you have an incision (the cut made at the time of surgery),  support your incision when coughing by placing a pillow or rolled up towels firmly against it. Once you are able to get out of bed, walk around indoors and cough well. You may stop using the incentive spirometer when instructed by your caregiver.  RISKS AND COMPLICATIONS  Take your time so you do not get dizzy or light-headed.  If you are in pain, you may need to take or ask for  pain medication before doing incentive spirometry. It is harder to take a deep breath if you are having pain. AFTER USE  Rest and breathe slowly and easily.  It can be helpful to keep track of a log of your progress. Your caregiver can provide you with a simple table to help with this. If you are using the spirometer at home, follow these instructions: [place] IF:   You are having difficultly using the spirometer.  You have trouble using the spirometer as often as instructed.  Your pain medication is not giving enough relief while using the spirometer.  You develop fever of 100.5 F (38.1 C) or higher. SEEK IMMEDIATE MEDICAL CARE IF:   You cough up bloody sputum that had not been present before.  You develop fever of 102 F (38.9 C) or greater.  You develop worsening pain at or near the incision site. MAKE SURE YOU:   Understand these instructions.  Will watch your condition.  Will get help right away if you are not doing well or get worse. Document Released: 01/28/2007 Document Revised: 12/10/2011 Document Reviewed: 03/31/2007 ExitCare Patient Information 2014 ExitCare, [place].   ________________________________________________________________________  WHAT IS A BLOOD TRANSFUSION? Blood Transfusion Information  A transfusion is the replacement of blood or some of its parts. Blood is made up of multiple cells which provide different functions.  Red blood cells carry oxygen and are used for blood loss replacement.  White blood cells fight against infection.  Platelets control bleeding.  Plasma helps clot blood.  Other blood products are available for specialized needs, such as hemophilia or other clotting disorders. BEFORE THE TRANSFUSION  Who gives blood for transfusions?   Healthy volunteers who are fully evaluated to make sure their blood is safe. This is blood bank blood. Transfusion therapy is the safest it has ever been in the practice of medicine.  Before blood is taken from a donor, a complete history is taken to make sure that person has no history of diseases nor engages in risky social behavior (examples are intravenous drug use or sexual activity with multiple partners). The donor's travel history is screened to minimize risk of transmitting infections, such as malaria. The donated blood is tested for signs of infectious diseases, such as HIV and hepatitis. The blood is then tested to be sure it is compatible with you in order to minimize the chance of a transfusion reaction. If you or a relative donates blood, this is often done in anticipation of surgery and is not appropriate for emergency situations. It takes many days to process the donated blood. RISKS AND COMPLICATIONS Although transfusion therapy is very safe and saves many lives, the main dangers of transfusion include:   Getting an infectious disease.  Developing a transfusion reaction. This is an allergic reaction to something in the blood you were given. Every precaution is taken to prevent this. The decision to have a blood transfusion has been considered carefully by your caregiver before blood is given. Blood is not given unless the benefits outweigh the risks. AFTER THE TRANSFUSION  Right after receiving a blood transfusion, you will usually feel much better and more energetic. This is especially true if your red blood cells have gotten low (anemic). The transfusion raises the level of the red blood cells which carry oxygen, and this usually causes an energy increase.  The nurse administering the transfusion will monitor you carefully for complications. HOME CARE INSTRUCTIONS  No special instructions are needed after a transfusion. You may find your energy is better. Speak with your caregiver about any limitations on activity for underlying diseases you may have. SEEK MEDICAL CARE IF:   Your condition is not improving after your transfusion.  You develop redness or  irritation at the intravenous (IV) site. SEEK IMMEDIATE MEDICAL CARE IF:  Any of the following symptoms occur over the next 12 hours:  Shaking chills.  You have a temperature by mouth above 102 F (38.9 C), not controlled by medicine.  Chest, back, or muscle pain.  People around you feel you are not acting correctly or are confused.  Shortness of breath or difficulty breathing.  Dizziness and fainting.  You get a rash or develop hives.  You have a decrease in urine output.  Your urine turns a dark color or changes to pink, red, or brown. Any of the following symptoms occur over the next 10 days:  You have a temperature by mouth above 102 F (38.9 C), not controlled by medicine.  Shortness of breath.  Weakness after normal activity.  The white part of the eye turns yellow (jaundice).  You have a decrease in the amount of urine or are urinating less often.  Your urine turns a dark color or changes to pink, red, or brown. Document Released: 09/14/2000 Document Revised: 12/10/2011 Document Reviewed: 05/03/2008 [facility] Patient Information 2014 [place], [place].  _______________________________________________________________________

## 2015-03-14 NOTE — Progress Notes (Signed)
EKG / epic 10/07/2014 CXR epic 10/07/2014  ECHO 03/14/2009 per epic  Surgical clearance per chart per Dr Selinda Flavin 03/02/2015  OV note per chart per [facility] 03/02/2015

## 2015-03-20 ENCOUNTER — Encounter (HOSPITAL_COMMUNITY): Payer: Self-pay | Admitting: Anesthesiology

## 2015-03-20 NOTE — Anesthesia Preprocedure Evaluation (Addendum)
Anesthesia Evaluation  Patient identified by MRN, date of birth, ID band Patient awake    Reviewed: Allergy & Precautions, NPO status , Patient's Chart, lab work & pertinent test results  Airway Mallampati: II  TM Distance: >3 FB Neck ROM: Full    Dental no notable dental hx.    Pulmonary neg pulmonary ROS, former smoker,  breath sounds clear to auscultation  Pulmonary exam normal       Cardiovascular hypertension, Pt. on medications Normal cardiovascular exam+ Valvular Problems/Murmurs Rhythm:Regular Rate:Normal     Neuro/Psych  Headaches, CVA negative psych ROS   GI/Hepatic Neg liver ROS, hiatal hernia, GERD-  ,  Endo/Other  negative endocrine ROS  Renal/GU negative Renal ROS  negative genitourinary   Musculoskeletal  (+) Arthritis -,   Abdominal   Peds negative pediatric ROS (+)  Hematology  (+) anemia ,   Anesthesia Other Findings   Reproductive/Obstetrics negative OB ROS                          Anesthesia Physical Anesthesia Plan  ASA: III  Anesthesia Plan: Spinal   Post-op Pain Management:    Induction: Intravenous  Airway Management Planned: Natural Airway  Additional Equipment:   Intra-op Plan:   Post-operative Plan:   Informed Consent: I have reviewed the patients History and Physical, chart, labs and discussed the procedure including the risks, benefits and alternatives for the proposed anesthesia with the patient or authorized representative who has indicated his/her understanding and acceptance.   Dental advisory given  Plan Discussed with: CRNA  Anesthesia Plan Comments: (Discussed risks and benefits of and differences between spinal and general. Discussed risks of spinal including headache, backache, failure, bleeding, infection, and nerve damage. Patient consents to spinal. Questions answered. Coagulation studies and platelet count acceptable.)         Anesthesia Quick Evaluation

## 2015-03-21 ENCOUNTER — Inpatient Hospital Stay (HOSPITAL_COMMUNITY): Payer: Commercial Managed Care - HMO | Admitting: Anesthesiology

## 2015-03-21 ENCOUNTER — Encounter (HOSPITAL_COMMUNITY)
Admission: RE | Disposition: A | Discharge status: Home / Self Care | Payer: Self-pay | Source: Ambulatory Visit | Attending: Orthopedic Surgery

## 2015-03-21 ENCOUNTER — Encounter (HOSPITAL_COMMUNITY): Payer: Self-pay | Admitting: *Deleted

## 2015-03-21 ENCOUNTER — Inpatient Hospital Stay (HOSPITAL_COMMUNITY)
Admission: RE | Admit: 2015-03-21 | Discharge: 2015-03-23 | DRG: 470 | Disposition: A | Discharge status: Home / Self Care | Payer: Commercial Managed Care - HMO | Source: Ambulatory Visit | Attending: Orthopedic Surgery | Admitting: Orthopedic Surgery

## 2015-03-21 DIAGNOSIS — Z7982 Long term (current) use of aspirin: Secondary | ICD-10-CM | POA: Diagnosis not present

## 2015-03-21 DIAGNOSIS — M1711 Unilateral primary osteoarthritis, right knee: Secondary | ICD-10-CM | POA: Diagnosis not present

## 2015-03-21 DIAGNOSIS — M170 Bilateral primary osteoarthritis of knee: Secondary | ICD-10-CM | POA: Diagnosis not present

## 2015-03-21 DIAGNOSIS — Z7901 Long term (current) use of anticoagulants: Secondary | ICD-10-CM

## 2015-03-21 DIAGNOSIS — Z96653 Presence of artificial knee joint, bilateral: Secondary | ICD-10-CM | POA: Diagnosis not present

## 2015-03-21 DIAGNOSIS — M329 Systemic lupus erythematosus, unspecified: Secondary | ICD-10-CM | POA: Diagnosis not present

## 2015-03-21 DIAGNOSIS — M179 Osteoarthritis of knee, unspecified: Secondary | ICD-10-CM | POA: Diagnosis present

## 2015-03-21 DIAGNOSIS — Z8673 Personal history of transient ischemic attack (TIA), and cerebral infarction without residual deficits: Secondary | ICD-10-CM

## 2015-03-21 DIAGNOSIS — M1710 Unilateral primary osteoarthritis, unspecified knee: Secondary | ICD-10-CM | POA: Diagnosis present

## 2015-03-21 DIAGNOSIS — M25561 Pain in right knee: Secondary | ICD-10-CM | POA: Diagnosis present

## 2015-03-21 DIAGNOSIS — I10 Essential (primary) hypertension: Secondary | ICD-10-CM | POA: Diagnosis not present

## 2015-03-21 DIAGNOSIS — Z87891 Personal history of nicotine dependence: Secondary | ICD-10-CM

## 2015-03-21 DIAGNOSIS — K219 Gastro-esophageal reflux disease without esophagitis: Secondary | ICD-10-CM | POA: Diagnosis not present

## 2015-03-21 DIAGNOSIS — Z86718 Personal history of other venous thrombosis and embolism: Secondary | ICD-10-CM

## 2015-03-21 DIAGNOSIS — R011 Cardiac murmur, unspecified: Secondary | ICD-10-CM | POA: Diagnosis not present

## 2015-03-21 HISTORY — PX: TOTAL KNEE ARTHROPLASTY: SHX125

## 2015-03-21 LAB — TYPE AND SCREEN
ABO/RH(D): O POS
ANTIBODY SCREEN: NEGATIVE

## 2015-03-21 LAB — APTT: APTT: 29 s (ref 24–37)

## 2015-03-21 LAB — PROTIME-INR
INR: 1.21 (ref 0.00–1.49)
Prothrombin Time: 15.5 seconds — ABNORMAL HIGH (ref 11.6–15.2)

## 2015-03-21 SURGERY — ARTHROPLASTY, KNEE, TOTAL
Anesthesia: Spinal | Site: Knee | Laterality: Right

## 2015-03-21 MED ORDER — FENTANYL CITRATE (PF) 100 MCG/2ML IJ SOLN
INTRAMUSCULAR | Status: DC | PRN
  Administered 2015-03-21: 100 ug via INTRAVENOUS

## 2015-03-21 MED ORDER — METHOCARBAMOL 1000 MG/10ML IJ SOLN
500.0000 mg | Freq: Four times a day (QID) | INTRAMUSCULAR | Status: DC | PRN
  Administered 2015-03-21: 500 mg via INTRAVENOUS
  Filled 2015-03-21 (×2): qty 5

## 2015-03-21 MED ORDER — DOCUSATE SODIUM 100 MG PO CAPS
100.0000 mg | ORAL_CAPSULE | Freq: Two times a day (BID) | ORAL | Status: DC
  Administered 2015-03-21 – 2015-03-23 (×4): 100 mg via ORAL

## 2015-03-21 MED ORDER — OXYCODONE HCL 5 MG PO TABS
5.0000 mg | ORAL_TABLET | ORAL | Status: DC | PRN
  Administered 2015-03-21 (×3): 10 mg via ORAL
  Administered 2015-03-21: 5 mg via ORAL
  Administered 2015-03-21: 10 mg via ORAL
  Administered 2015-03-21: 5 mg via ORAL
  Administered 2015-03-22 – 2015-03-23 (×7): 10 mg via ORAL
  Filled 2015-03-21 (×2): qty 2
  Filled 2015-03-21: qty 1
  Filled 2015-03-21 (×5): qty 2
  Filled 2015-03-21: qty 1
  Filled 2015-03-21 (×4): qty 2

## 2015-03-21 MED ORDER — CHLORHEXIDINE GLUCONATE 4 % EX LIQD: 60.0000 mL | Freq: Once | CUTANEOUS | Status: DC

## 2015-03-21 MED ORDER — LACTATED RINGERS IV SOLN: INTRAVENOUS | Status: DC

## 2015-03-21 MED ORDER — MORPHINE SULFATE 2 MG/ML IJ SOLN
1.0000 mg | INTRAMUSCULAR | Status: DC | PRN
  Administered 2015-03-21 (×2): 1 mg via INTRAVENOUS
  Administered 2015-03-21 – 2015-03-22 (×2): 2 mg via INTRAVENOUS
  Filled 2015-03-21 (×4): qty 1

## 2015-03-21 MED ORDER — METOCLOPRAMIDE HCL 10 MG PO TABS: 5.0000 mg | ORAL_TABLET | Freq: Three times a day (TID) | ORAL | Status: DC | PRN

## 2015-03-21 MED ORDER — BUPIVACAINE LIPOSOME 1.3 % IJ SUSP
20.0000 mL | Freq: Once | INTRAMUSCULAR | Status: DC
  Filled 2015-03-21: qty 20

## 2015-03-21 MED ORDER — SODIUM CHLORIDE 0.9 % IV SOLN
INTRAVENOUS | Status: DC
  Administered 2015-03-21 (×2): via INTRAVENOUS

## 2015-03-21 MED ORDER — CEFAZOLIN SODIUM-DEXTROSE 2-3 GM-% IV SOLR
2.0000 g | Freq: Four times a day (QID) | INTRAVENOUS | Status: AC
  Administered 2015-03-21 (×2): 2 g via INTRAVENOUS
  Filled 2015-03-21 (×2): qty 50

## 2015-03-21 MED ORDER — METHOCARBAMOL 500 MG PO TABS
500.0000 mg | ORAL_TABLET | Freq: Four times a day (QID) | ORAL | Status: DC | PRN
  Administered 2015-03-21 – 2015-03-23 (×6): 500 mg via ORAL
  Filled 2015-03-21 (×6): qty 1

## 2015-03-21 MED ORDER — PANTOPRAZOLE SODIUM 40 MG PO TBEC
40.0000 mg | DELAYED_RELEASE_TABLET | Freq: Every day | ORAL | Status: DC
  Administered 2015-03-21 – 2015-03-22 (×2): 40 mg via ORAL
  Filled 2015-03-21 (×3): qty 1

## 2015-03-21 MED ORDER — CEFAZOLIN SODIUM-DEXTROSE 2-3 GM-% IV SOLR
2.0000 g | INTRAVENOUS | Status: AC
  Administered 2015-03-21: 2 g via INTRAVENOUS

## 2015-03-21 MED ORDER — MIDAZOLAM HCL 5 MG/5ML IJ SOLN
INTRAMUSCULAR | Status: DC | PRN
  Administered 2015-03-21: 2 mg via INTRAVENOUS

## 2015-03-21 MED ORDER — FLEET ENEMA 7-19 GM/118ML RE ENEM: 1.0000 | ENEMA | Freq: Once | RECTAL | Status: AC | PRN

## 2015-03-21 MED ORDER — MEPERIDINE HCL 50 MG/ML IJ SOLN: 6.2500 mg | INTRAMUSCULAR | Status: DC | PRN

## 2015-03-21 MED ORDER — DEXAMETHASONE SODIUM PHOSPHATE 10 MG/ML IJ SOLN
INTRAMUSCULAR | Status: AC
  Filled 2015-03-21: qty 1

## 2015-03-21 MED ORDER — PROPOFOL 10 MG/ML IV BOLUS
INTRAVENOUS | Status: AC
  Filled 2015-03-21: qty 20

## 2015-03-21 MED ORDER — ACETAMINOPHEN 10 MG/ML IV SOLN
INTRAVENOUS | Status: AC
  Filled 2015-03-21: qty 100

## 2015-03-21 MED ORDER — TRAMADOL HCL 50 MG PO TABS
50.0000 mg | ORAL_TABLET | Freq: Four times a day (QID) | ORAL | Status: DC | PRN
  Administered 2015-03-22 – 2015-03-23 (×4): 100 mg via ORAL
  Filled 2015-03-21 (×4): qty 2

## 2015-03-21 MED ORDER — ONDANSETRON HCL 4 MG PO TABS: 4.0000 mg | ORAL_TABLET | Freq: Four times a day (QID) | ORAL | Status: DC | PRN

## 2015-03-21 MED ORDER — SODIUM CHLORIDE 0.9 % IV SOLN
INTRAVENOUS | Status: DC
Start: 2015-03-21 — End: 2015-03-21

## 2015-03-21 MED ORDER — STERILE WATER FOR IRRIGATION IR SOLN
Status: DC | PRN
  Administered 2015-03-21: 1500 mL

## 2015-03-21 MED ORDER — NITROGLYCERIN 0.4 MG SL SUBL: 0.4000 mg | SUBLINGUAL_TABLET | SUBLINGUAL | Status: DC | PRN

## 2015-03-21 MED ORDER — FENTANYL CITRATE (PF) 100 MCG/2ML IJ SOLN
INTRAMUSCULAR | Status: AC
  Filled 2015-03-21: qty 2

## 2015-03-21 MED ORDER — PROMETHAZINE HCL 25 MG/ML IJ SOLN: 6.2500 mg | INTRAMUSCULAR | Status: DC | PRN

## 2015-03-21 MED ORDER — BISACODYL 10 MG RE SUPP: 10.0000 mg | Freq: Every day | RECTAL | Status: DC | PRN

## 2015-03-21 MED ORDER — MIDAZOLAM HCL 2 MG/2ML IJ SOLN
INTRAMUSCULAR | Status: AC
  Filled 2015-03-21: qty 2

## 2015-03-21 MED ORDER — WARFARIN - PHARMACIST DOSING INPATIENT: Freq: Every day | Status: DC

## 2015-03-21 MED ORDER — TAMSULOSIN HCL 0.4 MG PO CAPS
0.4000 mg | ORAL_CAPSULE | Freq: Every morning | ORAL | Status: DC
  Administered 2015-03-22 – 2015-03-23 (×2): 0.4 mg via ORAL
  Filled 2015-03-21 (×2): qty 1

## 2015-03-21 MED ORDER — BUPIVACAINE LIPOSOME 1.3 % IJ SUSP
INTRAMUSCULAR | Status: DC | PRN
  Administered 2015-03-21: 20 mL

## 2015-03-21 MED ORDER — FENTANYL CITRATE (PF) 100 MCG/2ML IJ SOLN: 25.0000 ug | INTRAMUSCULAR | Status: DC | PRN

## 2015-03-21 MED ORDER — PROPOFOL INFUSION 10 MG/ML OPTIME
INTRAVENOUS | Status: DC | PRN
  Administered 2015-03-21: 100 ug/kg/min via INTRAVENOUS

## 2015-03-21 MED ORDER — CEFAZOLIN SODIUM-DEXTROSE 2-3 GM-% IV SOLR
INTRAVENOUS | Status: AC
  Filled 2015-03-21: qty 50

## 2015-03-21 MED ORDER — SODIUM CHLORIDE 0.9 % IJ SOLN
INTRAMUSCULAR | Status: AC
  Filled 2015-03-21: qty 50

## 2015-03-21 MED ORDER — PHENOL 1.4 % MT LIQD
1.0000 | OROMUCOSAL | Status: DC | PRN
  Filled 2015-03-21: qty 177

## 2015-03-21 MED ORDER — ENOXAPARIN SODIUM 30 MG/0.3ML ~~LOC~~ SOLN
30.0000 mg | Freq: Two times a day (BID) | SUBCUTANEOUS | Status: DC
  Administered 2015-03-22 – 2015-03-23 (×3): 30 mg via SUBCUTANEOUS
  Filled 2015-03-21 (×5): qty 0.3

## 2015-03-21 MED ORDER — ACETAMINOPHEN 10 MG/ML IV SOLN
1000.0000 mg | Freq: Once | INTRAVENOUS | Status: AC
  Administered 2015-03-21: 1000 mg via INTRAVENOUS
  Filled 2015-03-21: qty 100

## 2015-03-21 MED ORDER — BUPIVACAINE IN DEXTROSE 0.75-8.25 % IT SOLN
INTRATHECAL | Status: DC | PRN
  Administered 2015-03-21: 1.8 mL via INTRATHECAL

## 2015-03-21 MED ORDER — 0.9 % SODIUM CHLORIDE (POUR BTL) OPTIME
TOPICAL | Status: DC | PRN
  Administered 2015-03-21: 1000 mL

## 2015-03-21 MED ORDER — METOCLOPRAMIDE HCL 5 MG/ML IJ SOLN
5.0000 mg | Freq: Three times a day (TID) | INTRAMUSCULAR | Status: DC | PRN
  Administered 2015-03-21: 10 mg via INTRAVENOUS
  Filled 2015-03-21: qty 2

## 2015-03-21 MED ORDER — BUPIVACAINE HCL (PF) 0.25 % IJ SOLN
INTRAMUSCULAR | Status: AC
  Filled 2015-03-21: qty 30

## 2015-03-21 MED ORDER — WARFARIN SODIUM 7.5 MG PO TABS
7.5000 mg | ORAL_TABLET | Freq: Once | ORAL | Status: AC
  Administered 2015-03-21: 7.5 mg via ORAL
  Filled 2015-03-21: qty 1

## 2015-03-21 MED ORDER — ACETAMINOPHEN 325 MG PO TABS: 650.0000 mg | ORAL_TABLET | Freq: Four times a day (QID) | ORAL | Status: DC | PRN

## 2015-03-21 MED ORDER — LACTATED RINGERS IV SOLN
INTRAVENOUS | Status: DC | PRN
Start: 2015-03-21 — End: 2015-03-21
  Administered 2015-03-21 (×2): via INTRAVENOUS

## 2015-03-21 MED ORDER — ONDANSETRON HCL 4 MG/2ML IJ SOLN: 4.0000 mg | Freq: Four times a day (QID) | INTRAMUSCULAR | Status: DC | PRN

## 2015-03-21 MED ORDER — SODIUM CHLORIDE 0.9 % IJ SOLN
INTRAMUSCULAR | Status: DC | PRN
  Administered 2015-03-21: 30 mL

## 2015-03-21 MED ORDER — DEXAMETHASONE SODIUM PHOSPHATE 10 MG/ML IJ SOLN
10.0000 mg | Freq: Once | INTRAMUSCULAR | Status: AC
  Administered 2015-03-21: 10 mg via INTRAVENOUS

## 2015-03-21 MED ORDER — MENTHOL 3 MG MT LOZG: 1.0000 | LOZENGE | OROMUCOSAL | Status: DC | PRN

## 2015-03-21 MED ORDER — ACETAMINOPHEN 500 MG PO TABS
1000.0000 mg | ORAL_TABLET | Freq: Four times a day (QID) | ORAL | Status: AC
  Administered 2015-03-21 – 2015-03-22 (×4): 1000 mg via ORAL
  Filled 2015-03-21 (×4): qty 2

## 2015-03-21 MED ORDER — TRANEXAMIC ACID 1000 MG/10ML IV SOLN
2000.0000 mg | Freq: Once | INTRAVENOUS | Status: DC
  Filled 2015-03-21: qty 20

## 2015-03-21 MED ORDER — DIPHENHYDRAMINE HCL 12.5 MG/5ML PO ELIX: 12.5000 mg | ORAL_SOLUTION | ORAL | Status: DC | PRN

## 2015-03-21 MED ORDER — ACETAMINOPHEN 650 MG RE SUPP: 650.0000 mg | Freq: Four times a day (QID) | RECTAL | Status: DC | PRN

## 2015-03-21 MED ORDER — POLYETHYLENE GLYCOL 3350 17 G PO PACK
17.0000 g | PACK | Freq: Every day | ORAL | Status: DC | PRN
  Administered 2015-03-23: 17 g via ORAL
  Filled 2015-03-21: qty 1

## 2015-03-21 MED ORDER — DEXAMETHASONE SODIUM PHOSPHATE 10 MG/ML IJ SOLN
10.0000 mg | Freq: Once | INTRAMUSCULAR | Status: AC
  Administered 2015-03-22: 10 mg via INTRAVENOUS
  Filled 2015-03-21 (×2): qty 1

## 2015-03-21 SURGICAL SUPPLY — 68 items
BAG DECANTER FOR FLEXI CONT (MISCELLANEOUS) ×3 IMPLANT
BAG SPEC THK2 15X12 ZIP CLS (MISCELLANEOUS) ×1
BAG ZIPLOCK 12X15 (MISCELLANEOUS) ×3 IMPLANT
BANDAGE ELASTIC 6 VELCRO ST LF (GAUZE/BANDAGES/DRESSINGS) ×3 IMPLANT
BANDAGE ESMARK 6X9 LF (GAUZE/BANDAGES/DRESSINGS) ×1 IMPLANT
BLADE SAG 18X100X1.27 (BLADE) ×3 IMPLANT
BLADE SAW SGTL 11.0X1.19X90.0M (BLADE) ×3 IMPLANT
BNDG CMPR 82X61 PLY HI ABS (GAUZE/BANDAGES/DRESSINGS) ×1
BNDG CMPR 9X6 STRL LF SNTH (GAUZE/BANDAGES/DRESSINGS) ×1
BNDG CONFORM 6X.82 1P STRL (GAUZE/BANDAGES/DRESSINGS) ×2 IMPLANT
BNDG ESMARK 6X9 LF (GAUZE/BANDAGES/DRESSINGS) ×3
BOWL SMART MIX CTS (DISPOSABLE) ×3 IMPLANT
CAP KNEE TOTAL 3 SIGMA ×2 IMPLANT
CEMENT HV SMART SET (Cement) ×6 IMPLANT
CLOSURE STERI-STRIP 1/4X4 (GAUZE/BANDAGES/DRESSINGS) ×2 IMPLANT
CLOSURE WOUND 1/2 X4 (GAUZE/BANDAGES/DRESSINGS) ×2
CUFF TOURN SGL QUICK 34 (TOURNIQUET CUFF) ×3
CUFF TRNQT CYL 34X4X40X1 (TOURNIQUET CUFF) ×1 IMPLANT
DECANTER SPIKE VIAL GLASS SM (MISCELLANEOUS) ×3 IMPLANT
DRAPE EXTREMITY T 121X128X90 (DRAPE) ×3 IMPLANT
DRAPE POUCH INSTRU U-SHP 10X18 (DRAPES) ×3 IMPLANT
DRAPE U-SHAPE 47X51 STRL (DRAPES) ×3 IMPLANT
DRSG ADAPTIC 3X8 NADH LF (GAUZE/BANDAGES/DRESSINGS) ×3 IMPLANT
DRSG PAD ABDOMINAL 8X10 ST (GAUZE/BANDAGES/DRESSINGS) ×3 IMPLANT
DURAPREP 26ML APPLICATOR (WOUND CARE) ×3 IMPLANT
ELECT REM PT RETURN 9FT ADLT (ELECTROSURGICAL) ×3
ELECTRODE REM PT RTRN 9FT ADLT (ELECTROSURGICAL) ×1 IMPLANT
EVACUATOR 1/8 PVC DRAIN (DRAIN) ×3 IMPLANT
FACESHIELD WRAPAROUND (MASK) ×15 IMPLANT
FACESHIELD WRAPAROUND OR TEAM (MASK) ×5 IMPLANT
GAUZE SPONGE 4X4 12PLY STRL (GAUZE/BANDAGES/DRESSINGS) ×3 IMPLANT
GLOVE BIO SURGEON STRL SZ7.5 (GLOVE) IMPLANT
GLOVE BIO SURGEON STRL SZ8 (GLOVE) ×3 IMPLANT
GLOVE BIOGEL PI IND STRL 6.5 (GLOVE) IMPLANT
GLOVE BIOGEL PI IND STRL 8 (GLOVE) ×1 IMPLANT
GLOVE BIOGEL PI INDICATOR 6.5 (GLOVE)
GLOVE BIOGEL PI INDICATOR 8 (GLOVE) ×2
GLOVE SURG SS PI 6.5 STRL IVOR (GLOVE) IMPLANT
GOWN STRL REUS W/TWL LRG LVL3 (GOWN DISPOSABLE) ×3 IMPLANT
GOWN STRL REUS W/TWL XL LVL3 (GOWN DISPOSABLE) IMPLANT
HANDPIECE INTERPULSE COAX TIP (DISPOSABLE) ×3
IMMOBILIZER KNEE 20 (SOFTGOODS) ×3
IMMOBILIZER KNEE 20 THIGH 36 (SOFTGOODS) ×1 IMPLANT
KIT BASIN OR (CUSTOM PROCEDURE TRAY) ×3 IMPLANT
MANIFOLD NEPTUNE II (INSTRUMENTS) ×3 IMPLANT
NDL SAFETY ECLIPSE 18X1.5 (NEEDLE) ×2 IMPLANT
NEEDLE HYPO 18GX1.5 SHARP (NEEDLE) ×6
NS IRRIG 1000ML POUR BTL (IV SOLUTION) ×3 IMPLANT
PACK TOTAL JOINT (CUSTOM PROCEDURE TRAY) ×3 IMPLANT
PAD ABD 8X10 STRL (GAUZE/BANDAGES/DRESSINGS) ×3 IMPLANT
PADDING CAST COTTON 6X4 STRL (CAST SUPPLIES) ×9 IMPLANT
PEN SKIN MARKING BROAD (MISCELLANEOUS) ×3 IMPLANT
POSITIONER SURGICAL ARM (MISCELLANEOUS) ×3 IMPLANT
SET HNDPC FAN SPRY TIP SCT (DISPOSABLE) ×1 IMPLANT
STRIP CLOSURE SKIN 1/2X4 (GAUZE/BANDAGES/DRESSINGS) ×4 IMPLANT
SUCTION FRAZIER 12FR DISP (SUCTIONS) ×3 IMPLANT
SUT MNCRL AB 4-0 PS2 18 (SUTURE) ×3 IMPLANT
SUT VIC AB 2-0 CT1 27 (SUTURE) ×9
SUT VIC AB 2-0 CT1 TAPERPNT 27 (SUTURE) ×3 IMPLANT
SUT VLOC 180 0 24IN GS25 (SUTURE) ×3 IMPLANT
SYR 20CC LL (SYRINGE) ×3 IMPLANT
SYR 50ML LL SCALE MARK (SYRINGE) ×3 IMPLANT
TOWEL OR 17X26 10 PK STRL BLUE (TOWEL DISPOSABLE) ×3 IMPLANT
TOWEL OR NON WOVEN STRL DISP B (DISPOSABLE) IMPLANT
TRAY FOLEY W/METER SILVER 14FR (SET/KITS/TRAYS/PACK) ×3 IMPLANT
WATER STERILE IRR 1500ML POUR (IV SOLUTION) ×3 IMPLANT
WRAP KNEE MAXI GEL POST OP (GAUZE/BANDAGES/DRESSINGS) ×3 IMPLANT
YANKAUER SUCT BULB TIP 10FT TU (MISCELLANEOUS) ×3 IMPLANT

## 2015-03-21 NOTE — Interval H&P Note (Signed)
History and Physical Interval Note:  03/21/2015 6:47 AM  Jose Bush  has presented today for surgery, with the diagnosis of right knee osteoarthritis  The various methods of treatment have been discussed with the patient and family. After consideration of risks, benefits and other options for treatment, the patient has consented to  Procedure(s): RIGHT TOTAL KNEE ARTHROPLASTY (Right) as a surgical intervention .  The patient's history has been reviewed, patient examined, no change in status, stable for surgery.  I have reviewed the patient's chart and labs.  Questions were answered to the patient's satisfaction.     Loanne Drilling

## 2015-03-21 NOTE — Anesthesia Postprocedure Evaluation (Signed)
  Anesthesia Post-op Note  Patient: Jose Bush  Procedure(s) Performed: Procedure(s) (LRB): RIGHT TOTAL KNEE ARTHROPLASTY (Right)  Patient Location: PACU  Anesthesia Type: Spinal  Level of Consciousness: awake and alert   Airway and Oxygen Therapy: Patient Spontanous Breathing  Post-op Pain: mild  Post-op Assessment: Post-op Vital signs reviewed, Patient's Cardiovascular Status Stable, Respiratory Function Stable, Patent Airway and No signs of Nausea or vomiting  Last Vitals:  Filed Vitals:   03/21/15 1152  BP: 145/74  Pulse: 60  Temp: 36.7 C  Resp: 16    Post-op Vital Signs: stable   Complications: No apparent anesthesia complications

## 2015-03-21 NOTE — Anesthesia Procedure Notes (Signed)
Spinal  Start time: 03/21/2015 7:29 AM Staffing Anesthesiologist: Phillips Grout Resident/CRNA: [place], Haillie Radu C Performed by: resident/CRNA  Preanesthetic Checklist Completed: patient identified, site marked, surgical consent, pre-op evaluation, timeout performed, IV checked, risks and benefits discussed and monitors and equipment checked Spinal Block Patient position: sitting Prep: Betadine and site prepped and draped Patient monitoring: heart rate, cardiac monitor, continuous pulse ox and blood pressure Approach: right paramedian Location: L3-4 Injection technique: single-shot Needle Needle gauge: 22 G

## 2015-03-21 NOTE — Transfer of Care (Signed)
Immediate Anesthesia Transfer of Care Note  Patient: Jose Bush  Procedure(s) Performed: Procedure(s): RIGHT TOTAL KNEE ARTHROPLASTY (Right)  Patient Location: PACU  Anesthesia Type:Spinal  Level of Consciousness: awake, alert  and oriented  Airway & Oxygen Therapy: Patient Spontanous Breathing and Patient connected to face mask oxygen  Post-op Assessment: Report given to RN and Post -op Vital signs reviewed and stable  Post vital signs: Reviewed and stable  Last Vitals:  Filed Vitals:   03/21/15 0550  BP: 133/65  Pulse: 66  Temp: 36.4 C  Resp: 16    Complications: No apparent anesthesia complications

## 2015-03-21 NOTE — Op Note (Signed)
Pre-operative diagnosis- Osteoarthritis  Right knee(s)  Post-operative diagnosis- Osteoarthritis Right knee(s)  Procedure-  Right  Total Knee Arthroplasty  Surgeon- Jose Rankin. Precious Segall, MD  Assistant- Dimitri Ped, PA-C   Anesthesia-  Spinal  EBL-* No blood loss amount entered *   Drains Hemovac  Tourniquet time-  Total Tourniquet Time Documented: Thigh (Right) - 32 minutes Total: Thigh (Right) - 32 minutes     Complications- None  Condition-PACU - hemodynamically stable.   Brief Clinical Note  Jose Bush is a 74 y.o. year old male with end stage OA of his right knee with progressively worsening pain and dysfunction. He has constant pain, with activity and at rest and significant functional deficits with difficulties even with ADLs. He has had extensive non-op management including analgesics, injections of cortisone and viscosupplements, and home exercise program, but remains in significant pain with significant dysfunction. Radiographs show bone on bone arthritis medial and patellofemoral. He presents now for right Total Knee Arthroplasty.    Procedure in detail---   The patient is brought into the operating room and positioned supine on the operating table. After successful administration of  Spinal,   a tourniquet is placed high on the  Right thigh(s) and the lower extremity is prepped and draped in the usual sterile fashion. Time out is performed by the operating team and then the  Right lower extremity is wrapped in Esmarch, knee flexed and the tourniquet inflated to 300 mmHg.       A midline incision is made with a ten blade through the subcutaneous tissue to the level of the extensor mechanism. A fresh blade is used to make a medial parapatellar arthrotomy. Soft tissue over the proximal medial tibia is subperiosteally elevated to the joint line with a knife and into the semimembranosus bursa with a Cobb elevator. Soft tissue over the proximal lateral tibia is elevated with  attention being paid to avoiding the patellar tendon on the tibial tubercle. The patella is everted, knee flexed 90 degrees and the ACL and PCL are removed. Findings are bone on bone medial and patellofemoral with large global osteophytes.        The drill is used to create a starting hole in the distal femur and the canal is thoroughly irrigated with sterile saline to remove the fatty contents. The 5 degree Right  valgus alignment guide is placed into the femoral canal and the distal femoral cutting block is pinned to remove 10 mm off the distal femur. Resection is made with an oscillating saw.      The tibia is subluxed forward and the menisci are removed. The extramedullary alignment guide is placed referencing proximally at the medial aspect of the tibial tubercle and distally along the second metatarsal axis and tibial crest. The block is pinned to remove 38mm off the more deficient medial  side. Resection is made with an oscillating saw. Size 5is the most appropriate size for the tibia and the proximal tibia is prepared with the modular drill and keel punch for that size.      The femoral sizing guide is placed and size 5 is most appropriate. Rotation is marked off the epicondylar axis and confirmed by creating a rectangular flexion gap at 90 degrees. The size 5 cutting block is pinned in this rotation and the anterior, posterior and chamfer cuts are made with the oscillating saw. The intercondylar block is then placed and that cut is made.      Trial size 5 tibial component,  trial size 5 posterior stabilized femur and a 12.5  mm posterior stabilized rotating platform insert trial is placed. Full extension is achieved with excellent varus/valgus and anterior/posterior balance throughout full range of motion. The patella is everted and thickness measured to be 27  mm. Free hand resection is taken to 15 mm, a 41 template is placed, lug holes are drilled, trial patella is placed, and it tracks normally.  Osteophytes are removed off the posterior femur with the trial in place. All trials are removed and the cut bone surfaces prepared with pulsatile lavage. Cement is mixed and once ready for implantation, the size 4 tibial implant, size  5 posterior stabilized femoral component, and the size 41 patella are cemented in place and the patella is held with the clamp. The trial insert is placed and the knee held in full extension. The Exparel (20 ml mixed with 30 ml saline) and .25% Bupivicaine, are injected into the extensor mechanism, posterior capsule, medial and lateral gutters and subcutaneous tissues.  All extruded cement is removed and once the cement is hard the permanent 12.5 mm posterior stabilized rotating platform insert is placed into the tibial tray.      The wound is copiously irrigated with saline solution and the extensor mechanism closed over a hemovac drain with #1 V-loc suture. The tourniquet is released for a total tourniquet time of 32  minutes. Flexion against gravity is 140 degrees and the patella tracks normally. Subcutaneous tissue is closed with 2.0 vicryl and subcuticular with running 4.0 Monocryl. The incision is cleaned and dried and steri-strips and a bulky sterile dressing are applied. The limb is placed into a knee immobilizer and the patient is awakened and transported to recovery in stable condition.      Please note that a surgical assistant was a medical necessity for this procedure in order to perform it in a safe and expeditious manner. Surgical assistant was necessary to retract the ligaments and vital neurovascular structures to prevent injury to them and also necessary for proper positioning of the limb to allow for anatomic placement of the prosthesis.   Jose Rankin Laberta Wilbon, MD    03/21/2015, 8:21 AM

## 2015-03-21 NOTE — Evaluation (Signed)
Physical Therapy Evaluation Patient Details Name: Jose Bush MRN: [id number] DOB: 24-May-1941 Today's Date: 03/21/2015   History of Present Illness  Pt is a 74 year old male s/p R TKA with hx of L TKA 3 months ago.  Clinical Impression  Pt is s/p R TKA resulting in the deficits listed below (see PT Problem List).  Pt will benefit from skilled PT to increase their independence and safety with mobility to allow discharge to the venue listed below.  Pt mobilizing well POD #0 and plans to d/c home with spouse.     Follow Up Recommendations Home health PT    Equipment Recommendations  None recommended by PT    Recommendations for Other Services       Precautions / Restrictions Precautions Precautions: Knee Required Braces or Orthoses: Knee Immobilizer - Right Restrictions Other Position/Activity Restrictions: WBAT      Mobility  Bed Mobility Overal bed mobility: Needs Assistance Bed Mobility: Supine to Sit     Supine to sit: Min assist;HOB elevated     General bed mobility comments: assist for R LE  Transfers Overall transfer level: Needs assistance Equipment used: Rolling walker (2 wheeled) Transfers: Sit to/from Stand Sit to Stand: Min assist         General transfer comment: verbal cue for UE and LE positioning, assist to rise and steady  Ambulation/Gait Ambulation/Gait assistance: Min guard Ambulation Distance (Feet): 40 Feet Assistive device: Rolling walker (2 wheeled) Gait Pattern/deviations: Step-to pattern;Antalgic     General Gait Details: nausea with ambulation limiting distance, verbal cues for RW positioning and sequence  Stairs            Wheelchair Mobility    Modified Rankin (Stroke Patients Only)       Balance                                             Pertinent Vitals/Pain Pain Assessment: 0-10 Pain Score: 3  Pain Location: R knee Pain Descriptors / Indicators: Aching Pain Intervention(s): Limited  activity within patient's tolerance;Monitored during session;Repositioned    Home Living Family/patient expects to be discharged to:: Private residence Living Arrangements: Spouse/significant other Available Help at Discharge: Family Type of Home: House Home Access: Stairs to enter Entrance Stairs-Rails: Right Entrance Stairs-Number of Steps: 2 Home Layout: One level Home Equipment: Toilet riser;Walker - 2 wheels;Cane - single point;Shower seat      Prior Function Level of Independence: Independent               Hand Dominance        Extremity/Trunk Assessment               Lower Extremity Assessment: RLE deficits/detail RLE Deficits / Details: unable to perform SLR, fair quad contraction, maintained KI       Communication   Communication: No difficulties  Cognition Arousal/Alertness: Awake/alert Behavior During Therapy: WFL for tasks assessed/performed Overall Cognitive Status: Within Functional Limits for tasks assessed                      General Comments      Exercises        Assessment/Plan    PT Assessment Patient needs continued PT services  PT Diagnosis Difficulty walking;Acute pain   PT Problem List Decreased strength;Decreased range of motion;Decreased mobility;Decreased knowledge of use of DME;Pain  PT Treatment Interventions Functional mobility training;Stair training;Gait training;DME instruction;Patient/family education;Therapeutic activities;Therapeutic exercise   PT Goals (Current goals can be found in the Care Plan section) Acute Rehab PT Goals PT Goal Formulation: With patient Time For Goal Achievement: 03/26/15 Potential to Achieve Goals: Good    Frequency 7X/week   Barriers to discharge        Co-evaluation               End of Session Equipment Utilized During Treatment: Gait belt;Right knee immobilizer Activity Tolerance: Patient tolerated treatment well Patient left: in chair;with call bell/phone  within reach;with family/visitor present Nurse Communication: Mobility status         Time: 0352-4818 PT Time Calculation (min) (ACUTE ONLY): 20 min   Charges:   PT Evaluation $Initial PT Evaluation Tier I: 1 Procedure     PT G Codes:        Porshea Janowski,KATHrine E 03/21/2015, 3:12 PM Zenovia Jarred, PT, DPT 03/21/2015 Pager: [phone]

## 2015-03-21 NOTE — Progress Notes (Signed)
ANTICOAGULATION CONSULT NOTE - Initial Consult  Pharmacy Consult for warfarin Indication: DVT and VTE prophylaxis  No Known Allergies  Patient Measurements: Height: 5\' 8"  (172.7 cm) Weight: 209 lb (94.802 kg) IBW/kg (Calculated) : 68.4  Vital Signs: Temp: 97.7 F (36.5 C) (06/20 0954) Temp Source: Oral (06/20 0954) BP: 138/69 mmHg (06/20 0954) Pulse Rate: 57 (06/20 0954)  Labs:  Recent Labs  03/21/15 0545  APTT 29  LABPROT 15.5*  INR 1.21    Estimated Creatinine Clearance: 90.5 mL/min (by C-G formula based on Cr of 0.73).   Medical History: Past Medical History  Diagnosis Date  . HTN (hypertension)   . Chest pain, unspecified   . DVT (deep venous thrombosis)   . Lupus   . Stroke 2010    'MILD" per pt-  weakness left side  . GERD (gastroesophageal reflux disease)   . Arthritis   . Heart murmur     hx of   . Tinnitus   . Lupus   . History of hiatal hernia   . Shingles     hx of   . Ulcer of abdomen wall     Medications:  Scheduled:  . acetaminophen  1,000 mg Oral 4 times per day  .  ceFAZolin (ANCEF) IV  2 g Intravenous Q6H  . [START ON 03/22/2015] dexamethasone  10 mg Intravenous Once  . docusate sodium  100 mg Oral BID  . [START ON 03/22/2015] enoxaparin (LOVENOX) injection  30 mg Subcutaneous Q12H  . pantoprazole  40 mg Oral Daily  . [START ON 03/22/2015] tamsulosin  0.4 mg Oral q morning - 10a   Infusions:  . sodium chloride 100 mL/hr at 03/21/15 7017    Assessment: 74 yo s/p R TKA on 6/20 was on warfarin PTA for hx DVT at a dose of 5mg  daily to restart post op. Also to start Lovenox 30mg  q12 until INR >= 1.8. Baseline labs good  Goal of Therapy:  INR 2-3 Monitor platelets by anticoagulation protocol: Yes   Plan:  1) 7.5mg  warfarin tonight at 6pm 2) Daily INR   Hessie Knows, PharmD, BCPS Pager [phone] 03/21/2015 10:37 AM

## 2015-03-21 NOTE — Progress Notes (Signed)
Utilization review completed.  

## 2015-03-22 ENCOUNTER — Encounter (HOSPITAL_COMMUNITY): Payer: Self-pay | Admitting: Orthopedic Surgery

## 2015-03-22 LAB — CBC
HCT: 29.9 % — ABNORMAL LOW (ref 39.0–52.0)
HEMOGLOBIN: 9.9 g/dL — AB (ref 13.0–17.0)
MCH: 28.2 pg (ref 26.0–34.0)
MCHC: 33.1 g/dL (ref 30.0–36.0)
MCV: 85.2 fL (ref 78.0–100.0)
Platelets: 167 10*3/uL (ref 150–400)
RBC: 3.51 MIL/uL — ABNORMAL LOW (ref 4.22–5.81)
RDW: 15.2 % (ref 11.5–15.5)
WBC: 9.6 10*3/uL (ref 4.0–10.5)

## 2015-03-22 LAB — BASIC METABOLIC PANEL
Anion gap: 9 (ref 5–15)
BUN: 17 mg/dL (ref 6–20)
CO2: 24 mmol/L (ref 22–32)
Calcium: 8.4 mg/dL — ABNORMAL LOW (ref 8.9–10.3)
Chloride: 104 mmol/L (ref 101–111)
Creatinine, Ser: 0.69 mg/dL (ref 0.61–1.24)
GFR calc non Af Amer: >60 mL/min (ref >60)
Glucose, Bld: 144 mg/dL — ABNORMAL HIGH (ref 65–99)
POTASSIUM: 3.6 mmol/L (ref 3.5–5.1)
Sodium: 137 mmol/L (ref 135–145)

## 2015-03-22 LAB — PROTIME-INR
INR: 1.27 (ref 0.00–1.49)
Prothrombin Time: 16.1 seconds — ABNORMAL HIGH (ref 11.6–15.2)

## 2015-03-22 MED ORDER — WARFARIN SODIUM 7.5 MG PO TABS
7.5000 mg | ORAL_TABLET | Freq: Once | ORAL | Status: AC
  Administered 2015-03-22: 7.5 mg via ORAL
  Filled 2015-03-22: qty 1

## 2015-03-22 MED ORDER — METHOCARBAMOL 500 MG PO TABS: 500.0000 mg | ORAL_TABLET | Freq: Four times a day (QID) | ORAL | Status: DC | PRN

## 2015-03-22 MED ORDER — WARFARIN SODIUM 5 MG PO TABS: 5.0000 mg | ORAL_TABLET | Freq: Every morning | ORAL | Status: DC

## 2015-03-22 MED ORDER — OXYCODONE HCL 5 MG PO TABS: 5.0000 mg | ORAL_TABLET | ORAL | Status: DC | PRN

## 2015-03-22 MED ORDER — NON FORMULARY
20.0000 mg | Freq: Every day | Status: DC
Start: 2015-03-22 — End: 2015-03-22

## 2015-03-22 MED ORDER — OMEPRAZOLE 20 MG PO CPDR
20.0000 mg | DELAYED_RELEASE_CAPSULE | Freq: Every day | ORAL | Status: DC
  Administered 2015-03-22 – 2015-03-23 (×2): 20 mg via ORAL
  Filled 2015-03-22 (×2): qty 1

## 2015-03-22 MED ORDER — TRAMADOL HCL 50 MG PO TABS: 50.0000 mg | ORAL_TABLET | Freq: Four times a day (QID) | ORAL | Status: DC | PRN

## 2015-03-22 MED ORDER — ENOXAPARIN SODIUM 30 MG/0.3ML ~~LOC~~ SOLN: 30.0000 mg | Freq: Two times a day (BID) | SUBCUTANEOUS | Status: DC

## 2015-03-22 NOTE — Plan of Care (Signed)
Problem: Consults Goal: Diagnosis- Total Joint Replacement Outcome: Completed/Met Date Met:  03/22/15 Primary Total Knee RIGHT     

## 2015-03-22 NOTE — Care Management Note (Signed)
Case Management Note  Patient Details  Name: Jose Bush MRN: [id number] Date of Birth: May 30, 1941  Subjective/Objective:                   RIGHT TOTAL KNEE ARTHROPLASTY (Right)  Action/Plan: Discharge planning  Expected Discharge Date:  03/23/15               Expected Discharge Plan:  [place]  In-House Referral:     Discharge planning Services  CM Consult  Post Acute Care Choice:  Home Health Choice offered to:  Patient  DME Arranged:    DME Agency:  [place]:  PT [facility] Agency:  [place]  Status of Service:  Completed, signed off  Medicare Important Message Given:    Date Medicare IM Given:    Medicare IM give by:    Date Additional Medicare IM Given:    Additional Medicare Important Message give by:     If discussed at [place] of Stay Meetings, dates discussed:    Additional Comments: CM met with pt to offer choice of home health agency.  Pt chooses [facility] to render HHPT and specifically Jose Bush and Jose Bush as therapists.  Address and contact information verified by pt.  Referral called to [facility] rep, Kristen.  Pt has both rolling walker and 3n1 at home.  No other CM needs were communicated. Dellie Catholic, RN 03/22/2015, 10:42 AM

## 2015-03-22 NOTE — Progress Notes (Signed)
Physical Therapy Treatment Patient Details Name: Jose Bush MRN: [id number] DOB: January 04, 1941 Today's Date: 03/22/2015    History of Present Illness Pt is a 74 year old male s/p R TKA with hx of L TKA 3 months ago.    PT Comments    POD # 1 pm session.  Assisted with amb a second time a greater distance.  Increased c/o pain as meds are due now.  Amb to nurses station and back.  Applied ICE.  Notified RN request for pain meds.   Follow Up Recommendations  Home health PT     Equipment Recommendations  None recommended by PT (has equipment from prior)    Recommendations for Other Services       Precautions / Restrictions Precautions Precautions: Knee Precaution Comments: pt able to perform SLR do did not use KI Required Braces or Orthoses: Knee Immobilizer - Right Restrictions Weight Bearing Restrictions: No Other Position/Activity Restrictions: WBAT    Mobility  Bed Mobility               General bed mobility comments: Pt up in chair  Transfers Overall transfer level: Needs assistance Equipment used: Rolling walker (2 wheeled) Transfers: Sit to/from Stand Sit to Stand: Min guard;Supervision Stand pivot transfers: Supervision       General transfer comment: one VC on safety with turns  Ambulation/Gait Ambulation/Gait assistance: Min guard Ambulation Distance (Feet): 65 Feet Assistive device: Rolling walker (2 wheeled) Gait Pattern/deviations: Step-to pattern;Decreased stance time - right;Trunk flexed Gait velocity: decreased   General Gait Details: 50% VC's on proper sequencing and proper walker to self distance.    Stairs            Wheelchair Mobility    Modified Rankin (Stroke Patients Only)       Balance Overall balance assessment: Needs assistance                                  Cognition Arousal/Alertness: Awake/alert Behavior During Therapy: WFL for tasks assessed/performed Overall Cognitive Status: Within  Functional Limits for tasks assessed                      Exercises      General Comments        Pertinent Vitals/Pain Pain Assessment: 0-10 Pain Score: 2  Pain Location: R knee Pain Descriptors / Indicators: Aching;Sore Pain Intervention(s): Premedicated before session;Monitored during session;Repositioned;Ice applied    Home Living Family/patient expects to be discharged to:: Private residence Living Arrangements: Spouse/significant other Available Help at Discharge: Family Type of Home: House Home Access: Stairs to enter Entrance Stairs-Rails: Right Home Layout: One level Home Equipment: Toilet riser;Walker - 2 wheels;Cane - single point;Shower seat;Grab bars - tub/shower;Hand held shower head      Prior Function Level of Independence: Independent          PT Goals (current goals can now be found in the care plan section) Progress towards PT goals: Progressing toward goals    Frequency  7X/week    PT Plan      Co-evaluation             End of Session Equipment Utilized During Treatment: Gait belt Activity Tolerance: Patient tolerated treatment well Patient left: in chair;with call bell/phone within reach;with family/visitor present     Time: 4098-1191 PT Time Calculation (min) (ACUTE ONLY): 12 min  Charges:  $Gait Training: 8-22 mins  G Codes:      Rica Koyanagi  PTA [facility]  Acute  Rehab Pager      [phone]

## 2015-03-22 NOTE — Discharge Summary (Signed)
Physician Discharge Summary   Patient ID: SEVERN GODDARD MRN: [id number] DOB/AGE: [id number] 75 y.o.  Admit date: 03/21/2015 Discharge date:   Primary Diagnosis: Primary osteoarthritis right knee  Admission Diagnoses:  Past Medical History  Diagnosis Date  . HTN (hypertension)   . Chest pain, unspecified   . DVT (deep venous thrombosis)   . Lupus   . Stroke 2010    'MILD" per pt-  weakness left side  . GERD (gastroesophageal reflux disease)   . Arthritis   . Heart murmur     hx of   . Tinnitus   . Lupus   . History of hiatal hernia   . Shingles     hx of   . Ulcer of abdomen wall    Discharge Diagnoses:   Principal Problem:   OA (osteoarthritis) of knee  Estimated body mass index is 31.79 kg/(m^2) as calculated from the following:   Height as of this encounter: $RemoveBeforeD'5\' 8"'[id number]$  (1.727 m).   Weight as of this encounter: 94.802 kg (209 lb).  Procedure:  Procedure(s) (LRB): RIGHT TOTAL KNEE ARTHROPLASTY (Right)   Consults: None  HPI: The patient is a 73 year old male presenting for a post-operative visit and his H&P for the upcoming surgery. The patient comes in now 9 weeks out from left total knee arthroplasty. The patient states that he is doing very well at this time. The pain is under excellent control at this time and describe their pain as mild and mostly on the inner side. They are currently on Tylenol for their pain. He was completed his outpatient physical therapy. The patient feels that they are progressing well at this time. He is having more pain in the right knee than the left. He is really pleased with how his left knee is doing. The right knee is the main thing giving him trouble. He feels good about the left knee and is looking forward to getting the right knee operated on at this time. He is ready to proceed at this time. Laboratory Data: Admission on 03/21/2015  Component Date Value Ref Range Status  . aPTT 03/21/2015 29  24 - 37 seconds Final  . Prothrombin Time  03/21/2015 15.5* 11.6 - 15.2 seconds Final  . INR 03/21/2015 1.21  0.00 - 1.49 Final  . WBC 03/22/2015 9.6  4.0 - 10.5 K/uL Final  . RBC 03/22/2015 3.51* 4.22 - 5.81 MIL/uL Final  . Hemoglobin 03/22/2015 9.9* 13.0 - 17.0 g/dL Final  . HCT 03/22/2015 29.9* 39.0 - 52.0 % Final  . MCV 03/22/2015 85.2  78.0 - 100.0 fL Final  . MCH 03/22/2015 28.2  26.0 - 34.0 pg Final  . MCHC 03/22/2015 33.1  30.0 - 36.0 g/dL Final  . RDW 03/22/2015 15.2  11.5 - 15.5 % Final  . Platelets 03/22/2015 167  150 - 400 K/uL Final  . Sodium 03/22/2015 137  135 - 145 mmol/L Final  . Potassium 03/22/2015 3.6  3.5 - 5.1 mmol/L Final  . Chloride 03/22/2015 104  101 - 111 mmol/L Final  . CO2 03/22/2015 24  22 - 32 mmol/L Final  . Glucose, Bld 03/22/2015 144* 65 - 99 mg/dL Final  . BUN 03/22/2015 17  6 - 20 mg/dL Final  . Creatinine, Ser 03/22/2015 0.69  0.61 - 1.24 mg/dL Final  . Calcium 03/22/2015 8.4* 8.9 - 10.3 mg/dL Final  . GFR calc non Af Amer 03/22/2015 >60  >60 mL/min Final  . GFR calc Af Amer 03/22/2015 >60  >  60 mL/min Final   Comment: (NOTE) The eGFR has been calculated using the CKD EPI equation. This calculation has not been validated in all clinical situations. eGFR's persistently <60 mL/min signify possible Chronic Kidney Disease.   . Anion gap 03/22/2015 9  5 - 15 Final  . Prothrombin Time 03/22/2015 16.1* 11.6 - 15.2 seconds Final  . INR 03/22/2015 1.27  0.00 - 1.49 Final  Hospital Outpatient Visit on 03/14/2015  Component Date Value Ref Range Status  . aPTT 03/14/2015 43* 24 - 37 seconds Final   Comment:        IF BASELINE aPTT IS ELEVATED, SUGGEST PATIENT RISK ASSESSMENT BE USED TO DETERMINE APPROPRIATE ANTICOAGULANT THERAPY.   . WBC 03/14/2015 5.3  4.0 - 10.5 K/uL Final  . RBC 03/14/2015 4.31  4.22 - 5.81 MIL/uL Final  . Hemoglobin 03/14/2015 12.0* 13.0 - 17.0 g/dL Final  . HCT 03/14/2015 37.3* 39.0 - 52.0 % Final  . MCV 03/14/2015 86.5  78.0 - 100.0 fL Final  . MCH 03/14/2015 27.8   26.0 - 34.0 pg Final  . MCHC 03/14/2015 32.2  30.0 - 36.0 g/dL Final  . RDW 03/14/2015 14.9  11.5 - 15.5 % Final  . Platelets 03/14/2015 181  150 - 400 K/uL Final  . Sodium 03/14/2015 141  135 - 145 mmol/L Final  . Potassium 03/14/2015 4.2  3.5 - 5.1 mmol/L Final  . Chloride 03/14/2015 109  101 - 111 mmol/L Final  . CO2 03/14/2015 27  22 - 32 mmol/L Final  . Glucose, Bld 03/14/2015 75  65 - 99 mg/dL Final  . BUN 03/14/2015 15  6 - 20 mg/dL Final  . Creatinine, Ser 03/14/2015 0.73  0.61 - 1.24 mg/dL Final  . Calcium 03/14/2015 9.2  8.9 - 10.3 mg/dL Final  . Total Protein 03/14/2015 6.9  6.5 - 8.1 g/dL Final  . Albumin 03/14/2015 4.0  3.5 - 5.0 g/dL Final  . AST 03/14/2015 33  15 - 41 U/L Final  . ALT 03/14/2015 30  17 - 63 U/L Final  . Alkaline Phosphatase 03/14/2015 56  38 - 126 U/L Final  . Total Bilirubin 03/14/2015 0.5  0.3 - 1.2 mg/dL Final  . GFR calc non Af Amer 03/14/2015 >60  >60 mL/min Final  . GFR calc Af Amer 03/14/2015 >60  >60 mL/min Final   Comment: (NOTE) The eGFR has been calculated using the CKD EPI equation. This calculation has not been validated in all clinical situations. eGFR's persistently <60 mL/min signify possible Chronic Kidney Disease.   . Anion gap 03/14/2015 5  5 - 15 Final  . Prothrombin Time 03/14/2015 25.4* 11.6 - 15.2 seconds Final  . INR 03/14/2015 2.34* 0.00 - 1.49 Final  . ABO/RH(D) 03/14/2015 O POS   Final  . Antibody Screen 03/14/2015 NEG   Final  . Sample Expiration 03/14/2015 03/24/2015   Final  . Color, Urine 03/14/2015 YELLOW  YELLOW Final  . APPearance 03/14/2015 CLEAR  CLEAR Final  . Specific Gravity, Urine 03/14/2015 1.010  1.005 - 1.030 Final  . pH 03/14/2015 6.0  5.0 - 8.0 Final  . Glucose, UA 03/14/2015 NEGATIVE  NEGATIVE mg/dL Final  . Hgb urine dipstick 03/14/2015 NEGATIVE  NEGATIVE Final  . Bilirubin Urine 03/14/2015 NEGATIVE  NEGATIVE Final  . Ketones, ur 03/14/2015 NEGATIVE  NEGATIVE mg/dL Final  . Protein, ur 03/14/2015  NEGATIVE  NEGATIVE mg/dL Final  . Urobilinogen, UA 03/14/2015 0.2  0.0 - 1.0 mg/dL Final  . Nitrite 03/14/2015 NEGATIVE  NEGATIVE Final  . Leukocytes, UA 03/14/2015 NEGATIVE  NEGATIVE Final   MICROSCOPIC NOT DONE ON URINES WITH NEGATIVE PROTEIN, BLOOD, LEUKOCYTES, NITRITE, OR GLUCOSE <1000 mg/dL.  Marland Kitchen MRSA, PCR 03/14/2015 NEGATIVE  NEGATIVE Final  . Staphylococcus aureus 03/14/2015 NEGATIVE  NEGATIVE Final   Comment:        The Xpert SA Assay (FDA approved for NASAL specimens in patients over 48 years of age), is one component of a comprehensive surveillance program.  Test performance has been validated by [facility] for patients greater than or equal to 48 year old. It is not intended to diagnose infection nor to guide or monitor treatment.      X-Rays:No results found.  EKG: Orders placed or performed during the hospital encounter of 10/07/14  . EKG 12-Lead  . EKG 12-Lead  . ED EKG (<57mns upon arrival to the ED)  . ED EKG (<174ms upon arrival to the ED)  . EKG     Hospital Course: WiCLESTER CHLEBOWSKIs a [age].[facility]. [facility] and underwent Procedure(s): RIGHT TOTAL KNEE ARTHROPLASTY.  Patient tolerated the procedure well and was later transferred to the recovery room and then to the orthopaedic floor for postoperative care.  They were given PO and IV analgesics for pain control following their surgery.  They were given 24 hours of postoperative antibiotics of  Anti-infectives    Start     Dose/Rate Route Frequency Ordered Stop   03/21/15 1400  ceFAZolin (ANCEF) IVPB 2 g/50 mL premix     2 g 100 mL/hr over 30 Minutes Intravenous Every 6 hours 03/21/15 1002 03/21/15 2111   03/21/15 0519  ceFAZolin (ANCEF) IVPB 2 g/50 mL premix     2 g 100 mL/hr over 30 Minutes Intravenous On call to O.R. 03/21/15 0596756/20/16 0732     and started on DVT prophylaxis in the form of Coumadin.   PT and OT were  ordered for total joint protocol.  Discharge planning consulted to help with postop disposition and equipment needs.  Patient had a good night on the evening of surgery.  They started to get up OOB with therapy on day one. Hemovac drain was pulled without difficulty.  Continued to work with therapy into day two.  Dressing was changed on day two and the incision was clean without erythema or exudate.  By day three, the patient had progressed with therapy and meeting their goals.  Incision was healing well.  Patient was seen in rounds and was ready to go home.   Diet: Regular diet Activity:WBAT Follow-up:in 2 weeks Disposition - Home Discharged Condition: good   Discharge Instructions    Call MD / Call 911    Complete by:  As directed   If you experience chest pain or shortness of breath, CALL 911 and be transported to the hospital emergency room.  If you develope a fever above 101 F, pus (white drainage) or increased drainage or redness at the wound, or calf pain, call your surgeon's office.     Change dressing    Complete by:  As directed   Change dressing daily with sterile 4 x 4 inch gauze dressing and apply TED hose. Do not submerge the incision under water.     Constipation Prevention    Complete by:  As directed   Drink plenty of fluids.  Prune juice may be helpful.  You may use a stool  softener, such as Colace (over the counter) 100 mg twice a day.  Use MiraLax (over the counter) for constipation as needed.     Diet - low sodium heart healthy    Complete by:  As directed      Discharge instructions    Complete by:  As directed   Pick up stool softner and laxative for home use following surgery while on pain medications. Do not submerge incision under water. Please use good hand washing techniques while changing dressing each day. May shower starting three days after surgery. Please use a clean towel to pat the incision dry following showers. Continue to use ice for pain and swelling  after surgery. Do not use any lotions or creams on the incision until instructed by your surgeon.  Take Coumadin for three weeks for postoperative protocol and then the patient may resume their previous Coumadin home regimen.  The dose may need to be adjusted based upon the INR.  Please follow the INR and titrate Coumadin dose for a therapeutic range between 2.0 and 3.0 INR.  After completing the three weeks of Coumadin, the patient may resume their previous Coumadin home regimen.  Continue Lovenox injections until the INR is therapeutic at or greater than 2.0.  When INR reaches the therapeutic level of equal to or greater than 2.0, the patient may discontinue the Lovenox injections.  Postoperative Constipation Protocol  Constipation - defined medically as fewer than three stools per week and severe constipation as less than one stool per week.  One of the most common issues patients have following surgery is constipation.  Even if you have a regular bowel pattern at home, your normal regimen is likely to be disrupted due to multiple reasons following surgery.  Combination of anesthesia, postoperative narcotics, change in appetite and fluid intake all can affect your bowels.  In order to avoid complications following surgery, here are some recommendations in order to help you during your recovery period.  Colace (docusate) - Pick up an over-the-counter form of Colace or another stool softener and take twice a day as long as you are requiring postoperative pain medications.  Take with a full glass of water daily.  If you experience loose stools or diarrhea, hold the colace until you stool forms back up.  If your symptoms do not get better within 1 week or if they get worse, check with your doctor.  Dulcolax (bisacodyl) - Pick up over-the-counter and take as directed by the product packaging as needed to assist with the movement of your bowels.  Take with a full glass of water.  Use this product as  needed if not relieved by Colace only.   MiraLax (polyethylene glycol) - Pick up over-the-counter to have on hand.  MiraLax is a solution that will increase the amount of water in your bowels to assist with bowel movements.  Take as directed and can mix with a glass of water, juice, soda, coffee, or tea.  Take if you go more than two days without a movement. Do not use MiraLax more than once per day. Call your doctor if you are still constipated or irregular after using this medication for 7 days in a row.  If you continue to have problems with postoperative constipation, please contact the office for further assistance and recommendations.  If you experience "the worst abdominal pain ever" or develop nausea or vomiting, please contact the office immediatly for further recommendations for treatment.     Do not put  a pillow under the knee. Place it under the heel.    Complete by:  As directed      Do not sit on low chairs, stoools or toilet seats, as it may be difficult to get up from low surfaces    Complete by:  As directed      Driving restrictions    Complete by:  As directed   No driving until released by the physician.     Increase activity slowly as tolerated    Complete by:  As directed      Lifting restrictions    Complete by:  As directed   No lifting until released by the physician.     Patient may shower    Complete by:  As directed   You may shower without a dressing once there is no drainage.  Do not wash over the wound.  If drainage remains, do not shower until drainage stops.     TED hose    Complete by:  As directed   Use stockings (TED hose) for 3 weeks on both leg(s).  You may remove them at night for sleeping.     Weight bearing as tolerated    Complete by:  As directed   Laterality:  right  Extremity:  Lower            Medication List    STOP taking these medications        PLAQUENIL 200 MG tablet  Generic drug:  hydroxychloroquine      TAKE these  medications        acetaminophen 500 MG tablet  Commonly known as:  TYLENOL  Take 1,000 mg by mouth every 6 (six) hours as needed for moderate pain or headache.     aspirin 81 MG tablet  Take 81 mg by mouth daily.     enoxaparin 30 MG/0.3ML injection  Commonly known as:  LOVENOX  Inject 0.3 mLs (30 mg total) into the skin every 12 (twelve) hours. Continue Lovenox injections until the INR is therapeutic at or greater than 2.0.  When INR reaches the therapeutic level of equal to or greater than 2.0, the patient may discontinue the Lovenox injections.     ICY HOT EX  Apply 1 each topically daily as needed (left knee.).     lisinopril-hydrochlorothiazide 10-12.5 MG per tablet  Commonly known as:  PRINZIDE,ZESTORETIC  Take 1 tablet by mouth every morning.     methocarbamol 500 MG tablet  Commonly known as:  ROBAXIN  Take 1 tablet (500 mg total) by mouth every 6 (six) hours as needed for muscle spasms.     NITROSTAT 0.4 MG SL tablet  Generic drug:  nitroGLYCERIN  Place 0.4 mg under the tongue every 5 (five) minutes as needed for chest pain. x3 doses as needed for chest pain     omeprazole 20 MG capsule  Commonly known as:  PRILOSEC  Take 20 mg by mouth every morning.     oxyCODONE 5 MG immediate release tablet  Commonly known as:  Oxy IR/ROXICODONE  Take 1-2 tablets (5-10 mg total) by mouth every 3 (three) hours as needed for moderate pain, severe pain or breakthrough pain.     tamsulosin 0.4 MG Caps capsule  Commonly known as:  FLOMAX  Take 1 capsule by mouth every morning.     traMADol 50 MG tablet  Commonly known as:  ULTRAM  Take 1-2 tablets (50-100 mg total) by mouth every 6 (six) hours as  needed (mild pain).     warfarin 5 MG tablet  Commonly known as:  COUMADIN  Take 1 tablet (5 mg total) by mouth every morning. Take Coumadin for three weeks for postoperative protocol and then the patient may resume their previous Coumadin home regimen.  The dose may need to be adjusted  based upon the INR.  Please follow the INR and titrate Coumadin dose for a therapeutic range between 2.0 and 3.0 INR.  After completing the three weeks of Coumadin, the patient may resume their previous Coumadin home regimen.           Follow-up Information    Follow up with [facility].   Why:  home health physical therapy   Contact information:   [address] [place] [address] [phone]       Follow up with Gearlean Alf, MD. Schedule an appointment as soon as possible for a visit on 04/07/2015.   Specialty:  Orthopedic Surgery   Why:  Call office at [phone] to setup appointment on Thursday 04/07/2015 with Dr. Wynelle Link.   Contact information:   [address] [place] [address] [phone]       Signed: Arlee Muslim, PA-C Orthopaedic Surgery 03/22/2015, 8:57 PM

## 2015-03-22 NOTE — Discharge Instructions (Signed)
° °Dr. Frank Aluisio °Total Joint Specialist °[facility] °[address] °[place], [place] [address] °[phone] ° °TOTAL KNEE REPLACEMENT POSTOPERATIVE DIRECTIONS ° °Knee Rehabilitation, Guidelines Following Surgery  °Results after knee surgery are often greatly improved when you follow the exercise, range of motion and muscle strengthening exercises prescribed by your doctor. Safety measures are also important to protect the knee from further injury. Any time any of these exercises cause you to have increased pain or swelling in your knee joint, decrease the amount until you are comfortable again and slowly increase them. If you have problems or questions, call your caregiver or physical therapist for advice.  ° °HOME CARE INSTRUCTIONS  °Remove items at home which could result in a fall. This includes throw rugs or furniture in walking pathways.  °· ICE to the affected knee every three hours for 30 minutes at a time and then as needed for pain and swelling.  Continue to use ice on the knee for pain and swelling from surgery. You may notice swelling that will progress down to the foot and ankle.  This is normal after surgery.  Elevate the leg when you are not up walking on it.   °· Continue to use the breathing machine which will help keep your temperature down.  It is common for your temperature to cycle up and down following surgery, especially at night when you are not up moving around and exerting yourself.  The breathing machine keeps your lungs expanded and your temperature down. °· Do not place pillow under knee, focus on keeping the knee straight while resting ° °DIET °You may resume your previous home diet once your are discharged from the hospital. ° °DRESSING / WOUND CARE / SHOWERING °You may shower 3 days after surgery, but keep the wounds dry during showering.  You may use an occlusive plastic wrap (Press'n Seal for example), NO SOAKING/SUBMERGING IN THE BATHTUB.  If the  bandage gets wet, change with a clean dry gauze.  If the incision gets wet, pat the wound dry with a clean towel. °You may start showering once you are discharged home but do not submerge the incision under water. Just pat the incision dry and apply a dry gauze dressing on daily. °Change the surgical dressing daily and reapply a dry dressing each time. ° °ACTIVITY °Walk with your walker as instructed. °Use walker as long as suggested by your caregivers. °Avoid periods of inactivity such as sitting longer than an hour when not asleep. This helps prevent blood clots.  °You may resume a sexual relationship in one month or when given the OK by your doctor.  °You may return to work once you are cleared by your doctor.  °Do not drive a car for 6 weeks or until released by you surgeon.  °Do not drive while taking narcotics. ° °WEIGHT BEARING °Weight bearing as tolerated with assist device (walker, cane, etc) as directed, use it as long as suggested by your surgeon or therapist, typically at least 4-6 weeks. ° °POSTOPERATIVE CONSTIPATION PROTOCOL °Constipation - defined medically as fewer than three stools per week and severe constipation as less than one stool per week. ° °One of the most common issues patients have following surgery is constipation.  Even if you have a regular bowel pattern at home, your normal regimen is likely to be disrupted due to multiple reasons following surgery.  Combination of anesthesia, postoperative narcotics, change in appetite and fluid intake all can affect your bowels.    In order to avoid complications following surgery, here are some recommendations in order to help you during your recovery period. ° °Colace (docusate) - Pick up an over-the-counter form of Colace or another stool softener and take twice a day as long as you are requiring postoperative pain medications.  Take with a full glass of water daily.  If you experience loose stools or diarrhea, hold the colace until you stool forms  back up.  If your symptoms do not get better within 1 week or if they get worse, check with your doctor. ° °Dulcolax (bisacodyl) - Pick up over-the-counter and take as directed by the product packaging as needed to assist with the movement of your bowels.  Take with a full glass of water.  Use this product as needed if not relieved by Colace only.  ° °MiraLax (polyethylene glycol) - Pick up over-the-counter to have on hand.  MiraLax is a solution that will increase the amount of water in your bowels to assist with bowel movements.  Take as directed and can mix with a glass of water, juice, soda, coffee, or tea.  Take if you go more than two days without a movement. °Do not use MiraLax more than once per day. Call your doctor if you are still constipated or irregular after using this medication for 7 days in a row. ° °If you continue to have problems with postoperative constipation, please contact the office for further assistance and recommendations.  If you experience "the worst abdominal pain ever" or develop nausea or vomiting, please contact the office immediatly for further recommendations for treatment. ° °ITCHING ° If you experience itching with your medications, try taking only a single pain pill, or even half a pain pill at a time.  You can also use Benadryl over the counter for itching or also to help with sleep.  ° °TED HOSE STOCKINGS °Wear the elastic stockings on both legs for three weeks following surgery during the day but you may remove then at night for sleeping. ° °MEDICATIONS °See your medication summary on the “After Visit Summary” that the nursing staff will review with you prior to discharge.  You may have some home medications which will be placed on hold until you complete the course of blood thinner medication.  It is important for you to complete the blood thinner medication as prescribed by your surgeon.  Continue your approved medications as instructed at time of  discharge. ° °PRECAUTIONS °If you experience chest pain or shortness of breath - call 911 immediately for transfer to the hospital emergency department.  °If you develop a fever greater that 101 F, purulent drainage from wound, increased redness or drainage from wound, foul odor from the wound/dressing, or calf pain - CONTACT YOUR SURGEON.   °                                                °FOLLOW-UP APPOINTMENTS °Make sure you keep all of your appointments after your operation with your surgeon and caregivers. You should call the office at the above phone number and make an appointment for approximately two weeks after the date of your surgery or on the date instructed by your surgeon outlined in the "After Visit Summary". ° ° °RANGE OF MOTION AND STRENGTHENING EXERCISES  °Rehabilitation of the knee is important following a knee injury or   an operation. After just a few days of immobilization, the muscles of the thigh which control the knee become weakened and shrink (atrophy). Knee exercises are designed to build up the tone and strength of the thigh muscles and to improve knee motion. Often times heat used for twenty to thirty minutes before working out will loosen up your tissues and help with improving the range of motion but do not use heat for the first two weeks following surgery. These exercises can be done on a training (exercise) mat, on the floor, on a table or on a bed. Use what ever works the best and is most comfortable for you Knee exercises include:  °Leg Lifts - While your knee is still immobilized in a splint or cast, you can do straight leg raises. Lift the leg to 60 degrees, hold for 3 sec, and slowly lower the leg. Repeat 10-20 times 2-3 times daily. Perform this exercise against resistance later as your knee gets better.  °Quad and Hamstring Sets - Tighten up the muscle on the front of the thigh (Quad) and hold for 5-10 sec. Repeat this 10-20 times hourly. Hamstring sets are done by pushing the  foot backward against an object and holding for 5-10 sec. Repeat as with quad sets.  °· Leg Slides: Lying on your back, slowly slide your foot toward your buttocks, bending your knee up off the floor (only go as far as is comfortable). Then slowly slide your foot back down until your leg is flat on the floor again. °· Angel Wings: Lying on your back spread your legs to the side as far apart as you can without causing discomfort.  °A rehabilitation program following serious knee injuries can speed recovery and prevent re-injury in the future due to weakened muscles. Contact your doctor or a physical therapist for more information on knee rehabilitation.  ° °IF YOU ARE TRANSFERRED TO A SKILLED REHAB FACILITY °If the patient is transferred to a skilled rehab facility following release from the hospital, a list of the current medications will be sent to the facility for the patient to continue.  When discharged from the skilled rehab facility, please have the facility set up the patient's Home Health Physical Therapy prior to being released. Also, the skilled facility will be responsible for providing the patient with their medications at time of release from the facility to include their pain medication, the muscle relaxants, and their blood thinner medication. If the patient is still at the rehab facility at time of the two week follow up appointment, the skilled rehab facility will also need to assist the patient in arranging follow up appointment in our office and any transportation needs. ° °MAKE SURE YOU:  °Understand these instructions.  °Get help right away if you are not doing well or get worse.  ° ° °Pick up stool softner and laxative for home use following surgery while on pain medications. °Do not submerge incision under water. °Please use good hand washing techniques while changing dressing each day. °May shower starting three days after surgery. °Please use a clean towel to pat the incision dry following  showers. °Continue to use ice for pain and swelling after surgery. °Do not use any lotions or creams on the incision until instructed by your surgeon. ° °Take Coumadin for three weeks for postoperative protocol and then the patient may resume their previous Coumadin home regimen.  The dose may need to be adjusted based upon the INR.  Please follow the INR   and titrate Coumadin dose for a therapeutic range between 2.0 and 3.0 INR.  After completing the three weeks of Coumadin, the patient may resume their previous Coumadin home regimen. ° °Continue Lovenox injections until the INR is therapeutic at or greater than 2.0.  When INR reaches the therapeutic level of equal to or greater than 2.0, the patient may discontinue the Lovenox injections. ° °

## 2015-03-22 NOTE — Progress Notes (Signed)
   Subjective: 1 Day Post-Op Procedure(s) (LRB): RIGHT TOTAL KNEE ARTHROPLASTY (Right) Patient reports pain as moderate.   Patient seen in rounds with Dr. Lequita Halt.  Patient had a rough night withy very little sleep. Patient is well, but has had some minor complaints of pain in the knee, requiring pain medications We will resume therapy today. He was able to walk about 40 feet yesterday, the day of surgery. Plan is to go Home after hospital stay.  Objective: Vital signs in last 24 hours: Temp:  [97.3 F (36.3 C)-98.7 F (37.1 C)] 98.1 F (36.7 C) (06/21 0656) Pulse Rate:  [51-76] 76 (06/21 0656) Resp:  [12-18] 16 (06/21 0656) BP: (110-156)/(57-90) 126/72 mmHg (06/21 0656) SpO2:  [96 %-100 %] 98 % (06/21 0656) Weight:  [94.802 kg (209 lb)] 94.802 kg (209 lb) (06/20 0954)  Intake/Output from previous day:  Intake/Output Summary (Last 24 hours) at 03/22/15 0825 Last data filed at 03/22/15 0700  Gross per 24 hour  Intake   3970 ml  Output   3325 ml  Net    645 ml    Intake/Output this shift: UOP over 1000 since MN  Labs:  Recent Labs  03/22/15 0455  HGB 9.9*    Recent Labs  03/22/15 0455  WBC 9.6  RBC 3.51*  HCT 29.9*  PLT 167    Recent Labs  03/22/15 0455  NA 137  K 3.6  CL 104  CO2 24  BUN 17  CREATININE 0.69  GLUCOSE 144*  CALCIUM 8.4*    Recent Labs  03/21/15 0545 03/22/15 0455  INR 1.21 1.27    EXAM General - Patient is Alert, Appropriate and Oriented Extremity - Neurovascular intact Sensation intact distally Dorsiflexion/Plantar flexion intact Dressing - dressing C/D/I Motor Function - intact, moving foot and toes well on exam.  Hemovac pulled without difficulty.  Past Medical History  Diagnosis Date  . HTN (hypertension)   . Chest pain, unspecified   . DVT (deep venous thrombosis)   . Lupus   . Stroke 2010    'MILD" per pt-  weakness left side  . GERD (gastroesophageal reflux disease)   . Arthritis   . Heart murmur     hx of    . Tinnitus   . Lupus   . History of hiatal hernia   . Shingles     hx of   . Ulcer of abdomen wall     Assessment/Plan: 1 Day Post-Op Procedure(s) (LRB): RIGHT TOTAL KNEE ARTHROPLASTY (Right) Principal Problem:   OA (osteoarthritis) of knee  Estimated body mass index is 31.79 kg/(m^2) as calculated from the following:   Height as of this encounter: 5\' 8"  (1.727 m).   Weight as of this encounter: 94.802 kg (209 lb). Advance diet Up with therapy Plan for discharge tomorrow Discharge home with home health  DVT Prophylaxis - Lovenox and Coumadin Weight-Bearing as tolerated to right leg D/C O2 and Pulse OX and try on Room Air  Avel Peace, PA-C Orthopaedic Surgery 03/22/2015, 8:25 AM

## 2015-03-22 NOTE — Progress Notes (Signed)
Physical Therapy Treatment Patient Details Name: Jose Bush MRN: [id number] DOB: September 16, 1941 Today's Date: 03/22/2015    History of Present Illness Pt is a 74 year old male s/p R TKA with hx of L TKA 3 months ago.    PT Comments    POD # 1 am session.  Pt OOB in recliner able to perform SLR so did not apply KI.  Assisted with amb a greater distance in hallway then returned to room to perform TKR TE's followed by ICE.  Pt plans to D/C to home tomorrow.  Follow Up Recommendations  Home health PT     Equipment Recommendations  None recommended by PT (has equipment from prior)    Recommendations for Other Services       Precautions / Restrictions Precautions Precautions: Knee Precaution Comments: pt able to perform SLR do did not use KI Required Braces or Orthoses: Knee Immobilizer - Right Restrictions Weight Bearing Restrictions: No Other Position/Activity Restrictions: WBAT    Mobility  Bed Mobility               General bed mobility comments: Pt up in chair  Transfers Overall transfer level: Needs assistance Equipment used: Rolling walker (2 wheeled) Transfers: Sit to/from Stand Sit to Stand: Min guard;Supervision Stand pivot transfers: Supervision       General transfer comment: one VC on safety with turns  Ambulation/Gait Ambulation/Gait assistance: Min guard Ambulation Distance (Feet): 50 Feet Assistive device: Rolling walker (2 wheeled) Gait Pattern/deviations: Step-to pattern;Decreased stance time - right;Trunk flexed Gait velocity: decreased   General Gait Details: 50% VC's on proper sequencing and proper walker to self distance.    Stairs            Wheelchair Mobility    Modified Rankin (Stroke Patients Only)       Balance Overall balance assessment: Needs assistance                                  Cognition Arousal/Alertness: Awake/alert Behavior During Therapy: WFL for tasks assessed/performed Overall  Cognitive Status: Within Functional Limits for tasks assessed                      Exercises   Total Knee Replacement TE's 10 reps B LE ankle pumps 10 reps towel squeezes 10 reps knee presses 10 reps heel slides  10 reps SAQ's 10 reps SLR's 10 reps ABD Followed by ICE     General Comments        Pertinent Vitals/Pain Pain Assessment: 0-10 Pain Score: 2  Pain Location: R knee Pain Descriptors / Indicators: Aching;Sore Pain Intervention(s): Premedicated before session;Monitored during session;Repositioned;Ice applied    Home Living Family/patient expects to be discharged to:: Private residence Living Arrangements: Spouse/significant other Available Help at Discharge: Family Type of Home: House Home Access: Stairs to enter Entrance Stairs-Rails: Right Home Layout: One level Home Equipment: Toilet riser;Walker - 2 wheels;Cane - single point;Shower seat;Grab bars - tub/shower;Hand held shower head      Prior Function Level of Independence: Independent          PT Goals (current goals can now be found in the care plan section) Progress towards PT goals: Progressing toward goals    Frequency  7X/week    PT Plan      Co-evaluation             End of Session Equipment Utilized During  Treatment: Gait belt Activity Tolerance: Patient tolerated treatment well Patient left: in chair;with call bell/phone within reach;with family/visitor present     Time: 1975-8832 PT Time Calculation (min) (ACUTE ONLY): 25 min  Charges:  $Gait Training: 8-22 mins $Therapeutic Exercise: 8-22 mins                    G Codes:      Felecia Shelling  PTA [facility]  Acute  Rehab Pager      [phone]

## 2015-03-22 NOTE — Progress Notes (Signed)
Occupational Therapy Evaluation Patient Details Name: Jose Bush MRN: [id number] DOB: 1941/04/12 Today's Date: 03/22/2015    History of Present Illness Pt is a 74 year old male s/p R TKA with hx of L TKA 3 months ago.   Clinical Impression   Completed all education regarding compensatory techniques for ADL and functional mobility for ADL. Pt able to return demonstrate. No further OT needed. OT signing off.     Follow Up Recommendations  No OT follow up;Supervision - Intermittent    Equipment Recommendations  None recommended by OT    Recommendations for Other Services       Precautions / Restrictions Precautions Precautions: Knee Required Braces or Orthoses: Knee Immobilizer - Right Restrictions Weight Bearing Restrictions: No Other Position/Activity Restrictions: WBAT      Mobility Bed Mobility               General bed mobility comments: Pt up in chair  Transfers Overall transfer level: Needs assistance Equipment used: Standard walker Transfers: Sit to/from Stand;Stand Pivot Transfers Sit to Stand: Supervision Stand pivot transfers: Supervision       General transfer comment: good hand positioning    Balance Overall balance assessment: Needs assistance           Standing balance-Leahy Scale: Fair                              ADL Overall ADL's : Needs assistance/impaired                                     Functional mobility during ADLs: Supervision/safety;Rolling walker General ADL Comments: wife assisted with ADL this am. Completed education on shower transers. cues for safety . Pt able to return demonstrate safer technique     Vision     Perception     Praxis      Pertinent Vitals/Pain Pain Assessment: 0-10 Pain Score: 3  Pain Location: R knee Pain Descriptors / Indicators: Aching Pain Intervention(s): Limited activity within patient's tolerance;Repositioned;Ice applied     Hand Dominance      Extremity/Trunk Assessment Upper Extremity Assessment Upper Extremity Assessment: Overall WFL for tasks assessed   Lower Extremity Assessment Lower Extremity Assessment: Defer to PT evaluation   Cervical / Trunk Assessment Cervical / Trunk Assessment: Normal   Communication Communication Communication: No difficulties   Cognition Arousal/Alertness: Awake/alert Behavior During Therapy: WFL for tasks assessed/performed Overall Cognitive Status: Within Functional Limits for tasks assessed                     General Comments       Exercises       Shoulder Instructions      Home Living Family/patient expects to be discharged to:: Private residence Living Arrangements: Spouse/significant other Available Help at Discharge: Family Type of Home: House Home Access: Stairs to enter Secretary/administrator of Steps: 2 Entrance Stairs-Rails: Right Home Layout: One level     Bathroom Shower/Tub: Producer, television/film/video: Standard Bathroom Accessibility: Yes How Accessible: Accessible via walker Home Equipment: Toilet riser;Walker - 2 wheels;Cane - single point;Shower seat;Grab bars - tub/shower;Hand held shower head          Prior Functioning/Environment Level of Independence: Independent             OT Diagnosis: Generalized weakness;Acute pain   OT  Problem List: Decreased strength;Decreased range of motion;Decreased activity tolerance;Impaired balance (sitting and/or standing);Decreased safety awareness;Decreased knowledge of use of DME or AE;Pain   OT Treatment/Interventions:      OT Goals(Current goals can be found in the care plan section) Acute Rehab OT Goals OT Goal Formulation: All assessment and education complete, DC therapy  OT Frequency:     Barriers to D/C:            Co-evaluation              End of Session Equipment Utilized During Treatment: Rolling walker CPM Right Knee CPM Right Knee: Off Nurse Communication:  Mobility status  Activity Tolerance: Patient tolerated treatment well Patient left: in chair;with call bell/phone within reach;with family/visitor present   Time: 0902-0917 OT Time Calculation (min): 15 min Charges:  OT General Charges $OT Visit: 1 Procedure OT Evaluation $Initial OT Evaluation Tier I: 1 Procedure G-Codes:    Jose Bush,Jose Bush 04-12-15, 9:24 AM   Jose Bush, OTR/L  [phone] April 12, 2015

## 2015-03-22 NOTE — Progress Notes (Signed)
ANTICOAGULATION CONSULT NOTE - Follow Up  Pharmacy Consult for warfarin Indication: DVT and VTE prophylaxis  No Known Allergies  Patient Measurements: Height: 5\' 8"  (172.7 cm) Weight: 209 lb (94.802 kg) IBW/kg (Calculated) : 68.4  Vital Signs: Temp: 98.1 F (36.7 C) (06/21 0656) Temp Source: Oral (06/21 0656) BP: 126/72 mmHg (06/21 0656) Pulse Rate: 76 (06/21 0656)  Labs:  Recent Labs  03/21/15 0545 03/22/15 0455  HGB  --  9.9*  HCT  --  29.9*  PLT  --  167  APTT 29  --   LABPROT 15.5* 16.1*  INR 1.21 1.27  CREATININE  --  0.69    Estimated Creatinine Clearance: 90.5 mL/min (by C-G formula based on Cr of 0.69).   Medical History: Past Medical History  Diagnosis Date  . HTN (hypertension)   . Chest pain, unspecified   . DVT (deep venous thrombosis)   . Lupus   . Stroke 2010    'MILD" per pt-  weakness left side  . GERD (gastroesophageal reflux disease)   . Arthritis   . Heart murmur     hx of   . Tinnitus   . Lupus   . History of hiatal hernia   . Shingles     hx of   . Ulcer of abdomen wall     Medications:  Scheduled:  . docusate sodium  100 mg Oral BID  . enoxaparin (LOVENOX) injection  30 mg Subcutaneous Q12H  . omeprazole  20 mg Oral Daily  . tamsulosin  0.4 mg Oral q morning - 10a  . Warfarin - Pharmacist Dosing Inpatient   Does not apply q1800   Infusions:  . sodium chloride Stopped (03/22/15 0834)    Assessment: 74 yo s/p R TKA on 6/20 was on warfarin PTA for hx DVT at a dose of 5mg  daily to restart post op. Also to start Lovenox 30mg  q12 until INR >= 1.8. Baseline labs good  Today, 03/22/2015  INR subtherapeutic as expected POD1 and 1 dose of 7.5mg  warfarin given  No reported bleeding  Hgb dropped POD1  Goal of Therapy:  INR 2-3 Monitor platelets by anticoagulation protocol: Yes   Plan:  1) Repeat 7.5mg  warfarin tonight at 6pm 2) Daily INR 3) Lovenox until INR >= 1.8   Hessie Knows, PharmD, BCPS Pager  [phone] 03/22/2015 9:56 AM

## 2015-03-23 LAB — CBC
HEMATOCRIT: 32.6 % — AB (ref 39.0–52.0)
Hemoglobin: 10.3 g/dL — ABNORMAL LOW (ref 13.0–17.0)
MCH: 26.8 pg (ref 26.0–34.0)
MCHC: 31.6 g/dL (ref 30.0–36.0)
MCV: 84.9 fL (ref 78.0–100.0)
PLATELETS: 164 10*3/uL (ref 150–400)
RBC: 3.84 MIL/uL — AB (ref 4.22–5.81)
RDW: 15.4 % (ref 11.5–15.5)
WBC: 9.1 10*3/uL (ref 4.0–10.5)

## 2015-03-23 LAB — BASIC METABOLIC PANEL
ANION GAP: 9 (ref 5–15)
BUN: 16 mg/dL (ref 6–20)
CALCIUM: 8.8 mg/dL — AB (ref 8.9–10.3)
CO2: 24 mmol/L (ref 22–32)
Chloride: 106 mmol/L (ref 101–111)
Creatinine, Ser: 0.74 mg/dL (ref 0.61–1.24)
GFR calc Af Amer: >60 mL/min (ref >60)
Glucose, Bld: 113 mg/dL — ABNORMAL HIGH (ref 65–99)
Potassium: 3.9 mmol/L (ref 3.5–5.1)
SODIUM: 139 mmol/L (ref 135–145)

## 2015-03-23 LAB — PROTIME-INR
INR: 1.54 — AB (ref 0.00–1.49)
PROTHROMBIN TIME: 18.6 s — AB (ref 11.6–15.2)

## 2015-03-23 NOTE — Progress Notes (Signed)
ANTICOAGULATION CONSULT NOTE - Follow Up  Pharmacy Consult for warfarin Indication: DVT and VTE prophylaxis  No Known Allergies  Patient Measurements: Height: 5\' 8"  (172.7 cm) Weight: 209 lb (94.802 kg) IBW/kg (Calculated) : 68.4  Vital Signs: Temp: 99.5 F (37.5 C) (06/22 0545) Temp Source: Oral (06/22 0545) BP: 160/72 mmHg (06/22 0545) Pulse Rate: 82 (06/22 0507)  Labs:  Recent Labs  03/21/15 0545 03/22/15 0455 03/23/15 0431  HGB  --  9.9* 10.3*  HCT  --  29.9* 32.6*  PLT  --  167 164  APTT 29  --   --   LABPROT 15.5* 16.1* 18.6*  INR 1.21 1.27 1.54*  CREATININE  --  0.69 0.74   Estimated Creatinine Clearance: 90.5 mL/min (by C-G formula based on Cr of 0.74).  Medical History: Past Medical History  Diagnosis Date  . HTN (hypertension)   . Chest pain, unspecified   . DVT (deep venous thrombosis)   . Lupus   . Stroke 2010    'MILD" per pt-  weakness left side  . GERD (gastroesophageal reflux disease)   . Arthritis   . Heart murmur     hx of   . Tinnitus   . Lupus   . History of hiatal hernia   . Shingles     hx of   . Ulcer of abdomen wall    Medications:  Scheduled:  . docusate sodium  100 mg Oral BID  . enoxaparin (LOVENOX) injection  30 mg Subcutaneous Q12H  . omeprazole  20 mg Oral Daily  . tamsulosin  0.4 mg Oral q morning - 10a  . Warfarin - Pharmacist Dosing Inpatient   Does not apply q1800   Infusions:  . sodium chloride Stopped (03/22/15 0834)   Assessment: 73 yo s/p R TKA on 6/20 was on warfarin PTA for hx DVT at a dose of 5mg  daily to restart post op. Also to start Lovenox 30mg  q12 until INR >= 1.8. Baseline labs good  Today, 03/23/2015  INR 1.54 POD2 and 2 doses of 7.5mg  warfarin given  No reported bleeding  Hgb dropped POD1  Goal of Therapy:  INR 2-3 Monitor platelets by anticoagulation protocol: Yes   Plan:  1) Repeat 7.5mg  warfarin today at home, then resume 5mg  Warfarin daily 2) Plan INR per home health tomorrow 3)  Lovenox until INR >= 1.8, script written  Otho Bellows PharmD Pager [phone] 03/23/2015, 9:41 AM

## 2015-03-23 NOTE — Progress Notes (Signed)
Rn reviewed discharge instructions with patient and family. Patient educated about coumadin/lovenox regimen. All questions answered.   Paperwork and prescriptions given.   NT rolled patient down in wheelchair with all belongings to family car.

## 2015-03-23 NOTE — Progress Notes (Signed)
Physical Therapy Treatment Patient Details Name: Jose Bush MRN: [id number] DOB: [birth year] Today's Date: 03/23/2015    History of Present Illness Pt is a 74 year old male s/p R TKA with hx of L TKA 3 months ago.    PT Comments    POD # 2 pt progressing well and eager to D/C to home.  Had family amb pt in hallway wiith instruction on safe handling plus negotiating stairs.  Pt given handout HEP and instructed on freq plus use of ICE.   Follow Up Recommendations  Home health PT     Equipment Recommendations  None recommended by PT    Recommendations for Other Services       Precautions / Restrictions Precautions Precautions: Knee;Fall Precaution Comments: pt able to perform SLR do did not use KI Restrictions Weight Bearing Restrictions: No Other Position/Activity Restrictions: WBAT    Mobility  Bed Mobility               General bed mobility comments: Pt up in chair  Transfers Overall transfer level: Modified independent Equipment used: Rolling walker (2 wheeled) Transfers: Sit to/from Stand Sit to Stand: Modified independent (Device/Increase time)         General transfer comment: good safety cognition and use of hands to steady self  Ambulation/Gait Ambulation/Gait assistance: Supervision Ambulation Distance (Feet): 75 Feet Assistive device: Rolling walker (2 wheeled) Gait Pattern/deviations: Step-to pattern     General Gait Details: one VC to reduce gait speed to increase safety   Stairs Stairs: Yes Stairs assistance: Supervision Stair Management: Two rails;Forwards Number of Stairs: 2 General stair comments: with family and only initial VC on proper sequencing and walker safety  Wheelchair Mobility    Modified Rankin (Stroke Patients Only)       Balance                                    Cognition Arousal/Alertness: Awake/alert Behavior During Therapy: WFL for tasks assessed/performed Overall Cognitive Status:  Within Functional Limits for tasks assessed                      Exercises      General Comments        Pertinent Vitals/Pain Pain Assessment: 0-10 Pain Score: 3  Pain Location: R knee Pain Descriptors / Indicators: Aching;Sore Pain Intervention(s): Monitored during session;Premedicated before session;Repositioned;Ice applied    Home Living                      Prior Function            PT Goals (current goals can now be found in the care plan section) Progress towards PT goals: Progressing toward goals    Frequency  7X/week    PT Plan      Co-evaluation             End of Session Equipment Utilized During Treatment: Gait belt Activity Tolerance: Patient tolerated treatment well Patient left: in chair;with call bell/phone within reach;with family/visitor present     Time: 1048-1100 PT Time Calculation (min) (ACUTE ONLY): 12 min  Charges:  $Gait Training: 8-22 mins                    G Codes:      Felecia Shelling  PTA [facility]  Acute  Rehab Pager  [phone]  

## 2015-03-23 NOTE — Progress Notes (Signed)
   Subjective: 2 Days Post-Op Procedure(s) (LRB): RIGHT TOTAL KNEE ARTHROPLASTY (Right) Patient reports pain as mild.   Patient seen in rounds for Dr. Lequita Halt. Patient is well, and has had no acute complaints or problems Patient is ready to go home  Objective: Vital signs in last 24 hours: Temp:  [98.2 F (36.8 C)-99.9 F (37.7 C)] 99.5 F (37.5 C) (06/22 0545) Pulse Rate:  [63-82] 82 (06/22 0507) Resp:  [16-18] 18 (06/22 0507) BP: (136-168)/(65-81) 160/72 mmHg (06/22 0545) SpO2:  [95 %-97 %] 95 % (06/22 0507)  Intake/Output from previous day:  Intake/Output Summary (Last 24 hours) at 03/23/15 0931 Last data filed at 03/22/15 2200  Gross per 24 hour  Intake    240 ml  Output      0 ml  Net    240 ml    Labs:  Recent Labs  03/22/15 0455 03/23/15 0431  HGB 9.9* 10.3*    Recent Labs  03/22/15 0455 03/23/15 0431  WBC 9.6 9.1  RBC 3.51* 3.84*  HCT 29.9* 32.6*  PLT 167 164    Recent Labs  03/22/15 0455 03/23/15 0431  NA 137 139  K 3.6 3.9  CL 104 106  CO2 24 24  BUN 17 16  CREATININE 0.69 0.74  GLUCOSE 144* 113*  CALCIUM 8.4* 8.8*    Recent Labs  03/22/15 0455 03/23/15 0431  INR 1.27 1.54*    EXAM: General - Patient is Alert, Appropriate and Oriented Extremity - Neurovascular intact Sensation intact distally Incision - clean, dry Motor Function - intact, moving foot and toes well on exam.   Assessment/Plan: 2 Days Post-Op Procedure(s) (LRB): RIGHT TOTAL KNEE ARTHROPLASTY (Right) Procedure(s) (LRB): RIGHT TOTAL KNEE ARTHROPLASTY (Right) Past Medical History  Diagnosis Date  . HTN (hypertension)   . Chest pain, unspecified   . DVT (deep venous thrombosis)   . Lupus   . Stroke 2010    'MILD" per pt-  weakness left side  . GERD (gastroesophageal reflux disease)   . Arthritis   . Heart murmur     hx of   . Tinnitus   . Lupus   . History of hiatal hernia   . Shingles     hx of   . Ulcer of abdomen wall    Principal Problem:   OA  (osteoarthritis) of knee  Estimated body mass index is 31.79 kg/(m^2) as calculated from the following:   Height as of this encounter: 5\' 8"  (1.727 m).   Weight as of this encounter: 94.802 kg (209 lb).  Diet: Regular diet Activity:WBAT Follow-up:in 2 weeks Disposition - Home Discharged Condition: good  Avel Peace, PA-C Orthopaedic Surgery 03/23/2015, 9:31 AM

## 2015-03-24 DIAGNOSIS — M329 Systemic lupus erythematosus, unspecified: Secondary | ICD-10-CM | POA: Diagnosis not present

## 2015-03-24 DIAGNOSIS — Z7901 Long term (current) use of anticoagulants: Secondary | ICD-10-CM | POA: Diagnosis not present

## 2015-03-24 DIAGNOSIS — Z96651 Presence of right artificial knee joint: Secondary | ICD-10-CM | POA: Diagnosis not present

## 2015-03-24 DIAGNOSIS — I10 Essential (primary) hypertension: Secondary | ICD-10-CM | POA: Diagnosis not present

## 2015-03-24 DIAGNOSIS — Z87891 Personal history of nicotine dependence: Secondary | ICD-10-CM | POA: Diagnosis not present

## 2015-03-24 DIAGNOSIS — Z5181 Encounter for therapeutic drug level monitoring: Secondary | ICD-10-CM | POA: Diagnosis not present

## 2015-03-24 DIAGNOSIS — Z471 Aftercare following joint replacement surgery: Secondary | ICD-10-CM | POA: Diagnosis not present

## 2015-03-24 DIAGNOSIS — Z8673 Personal history of transient ischemic attack (TIA), and cerebral infarction without residual deficits: Secondary | ICD-10-CM | POA: Diagnosis not present

## 2015-03-25 DIAGNOSIS — Z8673 Personal history of transient ischemic attack (TIA), and cerebral infarction without residual deficits: Secondary | ICD-10-CM | POA: Diagnosis not present

## 2015-03-25 DIAGNOSIS — Z7901 Long term (current) use of anticoagulants: Secondary | ICD-10-CM | POA: Diagnosis not present

## 2015-03-25 DIAGNOSIS — M329 Systemic lupus erythematosus, unspecified: Secondary | ICD-10-CM | POA: Diagnosis not present

## 2015-03-25 DIAGNOSIS — Z87891 Personal history of nicotine dependence: Secondary | ICD-10-CM | POA: Diagnosis not present

## 2015-03-25 DIAGNOSIS — Z471 Aftercare following joint replacement surgery: Secondary | ICD-10-CM | POA: Diagnosis not present

## 2015-03-25 DIAGNOSIS — Z96651 Presence of right artificial knee joint: Secondary | ICD-10-CM | POA: Diagnosis not present

## 2015-03-25 DIAGNOSIS — Z5181 Encounter for therapeutic drug level monitoring: Secondary | ICD-10-CM | POA: Diagnosis not present

## 2015-03-25 DIAGNOSIS — I10 Essential (primary) hypertension: Secondary | ICD-10-CM | POA: Diagnosis not present

## 2015-03-28 DIAGNOSIS — Z96651 Presence of right artificial knee joint: Secondary | ICD-10-CM | POA: Diagnosis not present

## 2015-03-28 DIAGNOSIS — Z8673 Personal history of transient ischemic attack (TIA), and cerebral infarction without residual deficits: Secondary | ICD-10-CM | POA: Diagnosis not present

## 2015-03-28 DIAGNOSIS — M329 Systemic lupus erythematosus, unspecified: Secondary | ICD-10-CM | POA: Diagnosis not present

## 2015-03-28 DIAGNOSIS — I10 Essential (primary) hypertension: Secondary | ICD-10-CM | POA: Diagnosis not present

## 2015-03-28 DIAGNOSIS — Z5181 Encounter for therapeutic drug level monitoring: Secondary | ICD-10-CM | POA: Diagnosis not present

## 2015-03-28 DIAGNOSIS — Z7901 Long term (current) use of anticoagulants: Secondary | ICD-10-CM | POA: Diagnosis not present

## 2015-03-28 DIAGNOSIS — Z87891 Personal history of nicotine dependence: Secondary | ICD-10-CM | POA: Diagnosis not present

## 2015-03-28 DIAGNOSIS — Z471 Aftercare following joint replacement surgery: Secondary | ICD-10-CM | POA: Diagnosis not present

## 2015-03-31 DIAGNOSIS — Z87891 Personal history of nicotine dependence: Secondary | ICD-10-CM | POA: Diagnosis not present

## 2015-03-31 DIAGNOSIS — Z5181 Encounter for therapeutic drug level monitoring: Secondary | ICD-10-CM | POA: Diagnosis not present

## 2015-03-31 DIAGNOSIS — Z96651 Presence of right artificial knee joint: Secondary | ICD-10-CM | POA: Diagnosis not present

## 2015-03-31 DIAGNOSIS — Z7901 Long term (current) use of anticoagulants: Secondary | ICD-10-CM | POA: Diagnosis not present

## 2015-03-31 DIAGNOSIS — Z471 Aftercare following joint replacement surgery: Secondary | ICD-10-CM | POA: Diagnosis not present

## 2015-03-31 DIAGNOSIS — M329 Systemic lupus erythematosus, unspecified: Secondary | ICD-10-CM | POA: Diagnosis not present

## 2015-03-31 DIAGNOSIS — Z8673 Personal history of transient ischemic attack (TIA), and cerebral infarction without residual deficits: Secondary | ICD-10-CM | POA: Diagnosis not present

## 2015-03-31 DIAGNOSIS — I10 Essential (primary) hypertension: Secondary | ICD-10-CM | POA: Diagnosis not present

## 2015-04-01 DIAGNOSIS — Z471 Aftercare following joint replacement surgery: Secondary | ICD-10-CM | POA: Diagnosis not present

## 2015-04-01 DIAGNOSIS — Z87891 Personal history of nicotine dependence: Secondary | ICD-10-CM | POA: Diagnosis not present

## 2015-04-01 DIAGNOSIS — Z8673 Personal history of transient ischemic attack (TIA), and cerebral infarction without residual deficits: Secondary | ICD-10-CM | POA: Diagnosis not present

## 2015-04-01 DIAGNOSIS — I10 Essential (primary) hypertension: Secondary | ICD-10-CM | POA: Diagnosis not present

## 2015-04-01 DIAGNOSIS — Z5181 Encounter for therapeutic drug level monitoring: Secondary | ICD-10-CM | POA: Diagnosis not present

## 2015-04-01 DIAGNOSIS — Z7901 Long term (current) use of anticoagulants: Secondary | ICD-10-CM | POA: Diagnosis not present

## 2015-04-01 DIAGNOSIS — Z96651 Presence of right artificial knee joint: Secondary | ICD-10-CM | POA: Diagnosis not present

## 2015-04-01 DIAGNOSIS — M329 Systemic lupus erythematosus, unspecified: Secondary | ICD-10-CM | POA: Diagnosis not present

## 2015-04-04 DIAGNOSIS — Z8673 Personal history of transient ischemic attack (TIA), and cerebral infarction without residual deficits: Secondary | ICD-10-CM | POA: Diagnosis not present

## 2015-04-04 DIAGNOSIS — M329 Systemic lupus erythematosus, unspecified: Secondary | ICD-10-CM | POA: Diagnosis not present

## 2015-04-04 DIAGNOSIS — I10 Essential (primary) hypertension: Secondary | ICD-10-CM | POA: Diagnosis not present

## 2015-04-04 DIAGNOSIS — Z96651 Presence of right artificial knee joint: Secondary | ICD-10-CM | POA: Diagnosis not present

## 2015-04-04 DIAGNOSIS — Z471 Aftercare following joint replacement surgery: Secondary | ICD-10-CM | POA: Diagnosis not present

## 2015-04-04 DIAGNOSIS — Z87891 Personal history of nicotine dependence: Secondary | ICD-10-CM | POA: Diagnosis not present

## 2015-04-04 DIAGNOSIS — Z7901 Long term (current) use of anticoagulants: Secondary | ICD-10-CM | POA: Diagnosis not present

## 2015-04-04 DIAGNOSIS — Z5181 Encounter for therapeutic drug level monitoring: Secondary | ICD-10-CM | POA: Diagnosis not present

## 2015-04-05 DIAGNOSIS — Z7901 Long term (current) use of anticoagulants: Secondary | ICD-10-CM | POA: Diagnosis not present

## 2015-04-05 DIAGNOSIS — Z96651 Presence of right artificial knee joint: Secondary | ICD-10-CM | POA: Diagnosis not present

## 2015-04-05 DIAGNOSIS — Z471 Aftercare following joint replacement surgery: Secondary | ICD-10-CM | POA: Diagnosis not present

## 2015-04-05 DIAGNOSIS — I10 Essential (primary) hypertension: Secondary | ICD-10-CM | POA: Diagnosis not present

## 2015-04-05 DIAGNOSIS — M329 Systemic lupus erythematosus, unspecified: Secondary | ICD-10-CM | POA: Diagnosis not present

## 2015-04-05 DIAGNOSIS — Z8673 Personal history of transient ischemic attack (TIA), and cerebral infarction without residual deficits: Secondary | ICD-10-CM | POA: Diagnosis not present

## 2015-04-05 DIAGNOSIS — Z87891 Personal history of nicotine dependence: Secondary | ICD-10-CM | POA: Diagnosis not present

## 2015-04-05 DIAGNOSIS — Z5181 Encounter for therapeutic drug level monitoring: Secondary | ICD-10-CM | POA: Diagnosis not present

## 2015-04-08 DIAGNOSIS — Z8673 Personal history of transient ischemic attack (TIA), and cerebral infarction without residual deficits: Secondary | ICD-10-CM | POA: Diagnosis not present

## 2015-04-08 DIAGNOSIS — Z471 Aftercare following joint replacement surgery: Secondary | ICD-10-CM | POA: Diagnosis not present

## 2015-04-08 DIAGNOSIS — M329 Systemic lupus erythematosus, unspecified: Secondary | ICD-10-CM | POA: Diagnosis not present

## 2015-04-08 DIAGNOSIS — Z87891 Personal history of nicotine dependence: Secondary | ICD-10-CM | POA: Diagnosis not present

## 2015-04-08 DIAGNOSIS — Z7901 Long term (current) use of anticoagulants: Secondary | ICD-10-CM | POA: Diagnosis not present

## 2015-04-08 DIAGNOSIS — Z5181 Encounter for therapeutic drug level monitoring: Secondary | ICD-10-CM | POA: Diagnosis not present

## 2015-04-08 DIAGNOSIS — Z96651 Presence of right artificial knee joint: Secondary | ICD-10-CM | POA: Diagnosis not present

## 2015-04-08 DIAGNOSIS — I10 Essential (primary) hypertension: Secondary | ICD-10-CM | POA: Diagnosis not present

## 2015-04-11 DIAGNOSIS — I82499 Acute embolism and thrombosis of other specified deep vein of unspecified lower extremity: Secondary | ICD-10-CM | POA: Diagnosis not present

## 2015-04-12 DIAGNOSIS — Z96651 Presence of right artificial knee joint: Secondary | ICD-10-CM | POA: Diagnosis not present

## 2015-04-12 DIAGNOSIS — Z471 Aftercare following joint replacement surgery: Secondary | ICD-10-CM | POA: Diagnosis not present

## 2015-04-12 DIAGNOSIS — R2689 Other abnormalities of gait and mobility: Secondary | ICD-10-CM | POA: Diagnosis not present

## 2015-04-14 DIAGNOSIS — Z471 Aftercare following joint replacement surgery: Secondary | ICD-10-CM | POA: Diagnosis not present

## 2015-04-14 DIAGNOSIS — R2689 Other abnormalities of gait and mobility: Secondary | ICD-10-CM | POA: Diagnosis not present

## 2015-04-14 DIAGNOSIS — Z96651 Presence of right artificial knee joint: Secondary | ICD-10-CM | POA: Diagnosis not present

## 2015-04-19 DIAGNOSIS — Z96651 Presence of right artificial knee joint: Secondary | ICD-10-CM | POA: Diagnosis not present

## 2015-04-19 DIAGNOSIS — Z471 Aftercare following joint replacement surgery: Secondary | ICD-10-CM | POA: Diagnosis not present

## 2015-04-19 DIAGNOSIS — R2689 Other abnormalities of gait and mobility: Secondary | ICD-10-CM | POA: Diagnosis not present

## 2015-04-22 DIAGNOSIS — R2689 Other abnormalities of gait and mobility: Secondary | ICD-10-CM | POA: Diagnosis not present

## 2015-04-22 DIAGNOSIS — Z96651 Presence of right artificial knee joint: Secondary | ICD-10-CM | POA: Diagnosis not present

## 2015-04-22 DIAGNOSIS — Z471 Aftercare following joint replacement surgery: Secondary | ICD-10-CM | POA: Diagnosis not present

## 2015-04-26 DIAGNOSIS — Z471 Aftercare following joint replacement surgery: Secondary | ICD-10-CM | POA: Diagnosis not present

## 2015-04-26 DIAGNOSIS — R2689 Other abnormalities of gait and mobility: Secondary | ICD-10-CM | POA: Diagnosis not present

## 2015-04-26 DIAGNOSIS — Z96651 Presence of right artificial knee joint: Secondary | ICD-10-CM | POA: Diagnosis not present

## 2015-04-28 DIAGNOSIS — Z471 Aftercare following joint replacement surgery: Secondary | ICD-10-CM | POA: Diagnosis not present

## 2015-04-28 DIAGNOSIS — Z96651 Presence of right artificial knee joint: Secondary | ICD-10-CM | POA: Diagnosis not present

## 2015-05-23 DIAGNOSIS — I82499 Acute embolism and thrombosis of other specified deep vein of unspecified lower extremity: Secondary | ICD-10-CM | POA: Diagnosis not present

## 2015-06-20 DIAGNOSIS — K219 Gastro-esophageal reflux disease without esophagitis: Secondary | ICD-10-CM | POA: Diagnosis not present

## 2015-06-20 DIAGNOSIS — M1990 Unspecified osteoarthritis, unspecified site: Secondary | ICD-10-CM | POA: Diagnosis not present

## 2015-06-20 DIAGNOSIS — Z0001 Encounter for general adult medical examination with abnormal findings: Secondary | ICD-10-CM | POA: Diagnosis not present

## 2015-06-20 DIAGNOSIS — I10 Essential (primary) hypertension: Secondary | ICD-10-CM | POA: Diagnosis not present

## 2015-06-23 DIAGNOSIS — Z79899 Other long term (current) drug therapy: Secondary | ICD-10-CM | POA: Diagnosis not present

## 2015-06-23 DIAGNOSIS — R079 Chest pain, unspecified: Secondary | ICD-10-CM | POA: Diagnosis not present

## 2015-06-23 DIAGNOSIS — M542 Cervicalgia: Secondary | ICD-10-CM | POA: Diagnosis not present

## 2015-06-23 DIAGNOSIS — M329 Systemic lupus erythematosus, unspecified: Secondary | ICD-10-CM | POA: Diagnosis not present

## 2015-06-23 DIAGNOSIS — M7531 Calcific tendinitis of right shoulder: Secondary | ICD-10-CM | POA: Diagnosis not present

## 2015-07-01 DIAGNOSIS — G629 Polyneuropathy, unspecified: Secondary | ICD-10-CM | POA: Diagnosis not present

## 2015-07-01 DIAGNOSIS — G2581 Restless legs syndrome: Secondary | ICD-10-CM | POA: Diagnosis not present

## 2015-07-01 DIAGNOSIS — Z0001 Encounter for general adult medical examination with abnormal findings: Secondary | ICD-10-CM | POA: Diagnosis not present

## 2015-07-01 DIAGNOSIS — N400 Enlarged prostate without lower urinary tract symptoms: Secondary | ICD-10-CM | POA: Diagnosis not present

## 2015-07-01 DIAGNOSIS — M1990 Unspecified osteoarthritis, unspecified site: Secondary | ICD-10-CM | POA: Diagnosis not present

## 2015-07-01 DIAGNOSIS — I82499 Acute embolism and thrombosis of other specified deep vein of unspecified lower extremity: Secondary | ICD-10-CM | POA: Diagnosis not present

## 2015-07-01 DIAGNOSIS — K219 Gastro-esophageal reflux disease without esophagitis: Secondary | ICD-10-CM | POA: Diagnosis not present

## 2015-07-01 DIAGNOSIS — I10 Essential (primary) hypertension: Secondary | ICD-10-CM | POA: Diagnosis not present

## 2015-07-01 DIAGNOSIS — M329 Systemic lupus erythematosus, unspecified: Secondary | ICD-10-CM | POA: Diagnosis not present

## 2015-07-26 DIAGNOSIS — Z471 Aftercare following joint replacement surgery: Secondary | ICD-10-CM | POA: Diagnosis not present

## 2015-07-26 DIAGNOSIS — Z96652 Presence of left artificial knee joint: Secondary | ICD-10-CM | POA: Diagnosis not present

## 2015-08-01 DIAGNOSIS — H524 Presbyopia: Secondary | ICD-10-CM | POA: Diagnosis not present

## 2015-08-01 DIAGNOSIS — H353131 Nonexudative age-related macular degeneration, bilateral, early dry stage: Secondary | ICD-10-CM | POA: Diagnosis not present

## 2015-08-01 DIAGNOSIS — H25813 Combined forms of age-related cataract, bilateral: Secondary | ICD-10-CM | POA: Diagnosis not present

## 2015-08-12 DIAGNOSIS — I82499 Acute embolism and thrombosis of other specified deep vein of unspecified lower extremity: Secondary | ICD-10-CM | POA: Diagnosis not present

## 2015-09-09 DIAGNOSIS — I82499 Acute embolism and thrombosis of other specified deep vein of unspecified lower extremity: Secondary | ICD-10-CM | POA: Diagnosis not present

## 2015-09-17 DIAGNOSIS — J0101 Acute recurrent maxillary sinusitis: Secondary | ICD-10-CM | POA: Diagnosis not present

## 2015-09-17 DIAGNOSIS — R062 Wheezing: Secondary | ICD-10-CM | POA: Diagnosis not present

## 2015-10-10 DIAGNOSIS — R062 Wheezing: Secondary | ICD-10-CM | POA: Diagnosis not present

## 2015-10-10 DIAGNOSIS — R05 Cough: Secondary | ICD-10-CM | POA: Diagnosis not present

## 2015-10-17 DIAGNOSIS — H6123 Impacted cerumen, bilateral: Secondary | ICD-10-CM | POA: Diagnosis not present

## 2015-10-17 DIAGNOSIS — J0101 Acute recurrent maxillary sinusitis: Secondary | ICD-10-CM | POA: Diagnosis not present

## 2015-10-17 DIAGNOSIS — I82499 Acute embolism and thrombosis of other specified deep vein of unspecified lower extremity: Secondary | ICD-10-CM | POA: Diagnosis not present

## 2015-10-19 DIAGNOSIS — S20212A Contusion of left front wall of thorax, initial encounter: Secondary | ICD-10-CM | POA: Diagnosis not present

## 2015-10-21 DIAGNOSIS — I82499 Acute embolism and thrombosis of other specified deep vein of unspecified lower extremity: Secondary | ICD-10-CM | POA: Diagnosis not present

## 2015-12-02 DIAGNOSIS — I82499 Acute embolism and thrombosis of other specified deep vein of unspecified lower extremity: Secondary | ICD-10-CM | POA: Diagnosis not present

## 2015-12-05 DIAGNOSIS — Z79899 Other long term (current) drug therapy: Secondary | ICD-10-CM | POA: Diagnosis not present

## 2015-12-05 DIAGNOSIS — H3530 Unspecified macular degeneration: Secondary | ICD-10-CM | POA: Diagnosis not present

## 2015-12-05 DIAGNOSIS — M329 Systemic lupus erythematosus, unspecified: Secondary | ICD-10-CM | POA: Diagnosis not present

## 2015-12-05 DIAGNOSIS — R079 Chest pain, unspecified: Secondary | ICD-10-CM | POA: Diagnosis not present

## 2015-12-05 DIAGNOSIS — M25551 Pain in right hip: Secondary | ICD-10-CM | POA: Diagnosis not present

## 2015-12-19 DIAGNOSIS — I10 Essential (primary) hypertension: Secondary | ICD-10-CM | POA: Diagnosis not present

## 2015-12-19 DIAGNOSIS — K219 Gastro-esophageal reflux disease without esophagitis: Secondary | ICD-10-CM | POA: Diagnosis not present

## 2015-12-19 DIAGNOSIS — Z86718 Personal history of other venous thrombosis and embolism: Secondary | ICD-10-CM | POA: Diagnosis not present

## 2015-12-19 DIAGNOSIS — M79661 Pain in right lower leg: Secondary | ICD-10-CM | POA: Diagnosis not present

## 2015-12-19 DIAGNOSIS — M7989 Other specified soft tissue disorders: Secondary | ICD-10-CM | POA: Diagnosis not present

## 2015-12-19 DIAGNOSIS — R600 Localized edema: Secondary | ICD-10-CM | POA: Diagnosis not present

## 2015-12-19 DIAGNOSIS — Z7901 Long term (current) use of anticoagulants: Secondary | ICD-10-CM | POA: Diagnosis not present

## 2015-12-19 DIAGNOSIS — M79605 Pain in left leg: Secondary | ICD-10-CM | POA: Diagnosis not present

## 2015-12-19 DIAGNOSIS — Z79899 Other long term (current) drug therapy: Secondary | ICD-10-CM | POA: Diagnosis not present

## 2015-12-21 DIAGNOSIS — M1611 Unilateral primary osteoarthritis, right hip: Secondary | ICD-10-CM | POA: Diagnosis not present

## 2015-12-21 DIAGNOSIS — G629 Polyneuropathy, unspecified: Secondary | ICD-10-CM | POA: Diagnosis not present

## 2015-12-21 DIAGNOSIS — R600 Localized edema: Secondary | ICD-10-CM | POA: Diagnosis not present

## 2015-12-21 DIAGNOSIS — M7989 Other specified soft tissue disorders: Secondary | ICD-10-CM | POA: Diagnosis not present

## 2015-12-21 DIAGNOSIS — M79604 Pain in right leg: Secondary | ICD-10-CM | POA: Diagnosis not present

## 2015-12-21 DIAGNOSIS — G2581 Restless legs syndrome: Secondary | ICD-10-CM | POA: Diagnosis not present

## 2015-12-21 DIAGNOSIS — I10 Essential (primary) hypertension: Secondary | ICD-10-CM | POA: Diagnosis not present

## 2016-01-02 DIAGNOSIS — M1611 Unilateral primary osteoarthritis, right hip: Secondary | ICD-10-CM | POA: Diagnosis not present

## 2016-01-12 DIAGNOSIS — I82499 Acute embolism and thrombosis of other specified deep vein of unspecified lower extremity: Secondary | ICD-10-CM | POA: Diagnosis not present

## 2016-01-25 DIAGNOSIS — M1611 Unilateral primary osteoarthritis, right hip: Secondary | ICD-10-CM | POA: Diagnosis not present

## 2016-02-23 DIAGNOSIS — I82499 Acute embolism and thrombosis of other specified deep vein of unspecified lower extremity: Secondary | ICD-10-CM | POA: Diagnosis not present

## 2016-02-29 DIAGNOSIS — M1611 Unilateral primary osteoarthritis, right hip: Secondary | ICD-10-CM | POA: Diagnosis not present

## 2016-02-29 DIAGNOSIS — Z471 Aftercare following joint replacement surgery: Secondary | ICD-10-CM | POA: Diagnosis not present

## 2016-02-29 DIAGNOSIS — Z96653 Presence of artificial knee joint, bilateral: Secondary | ICD-10-CM | POA: Diagnosis not present

## 2016-03-22 DIAGNOSIS — M50322 Other cervical disc degeneration at C5-C6 level: Secondary | ICD-10-CM | POA: Diagnosis not present

## 2016-04-05 DIAGNOSIS — I82499 Acute embolism and thrombosis of other specified deep vein of unspecified lower extremity: Secondary | ICD-10-CM | POA: Diagnosis not present

## 2016-05-16 DIAGNOSIS — I82499 Acute embolism and thrombosis of other specified deep vein of unspecified lower extremity: Secondary | ICD-10-CM | POA: Diagnosis not present

## 2016-06-12 DIAGNOSIS — M329 Systemic lupus erythematosus, unspecified: Secondary | ICD-10-CM | POA: Diagnosis not present

## 2016-06-12 DIAGNOSIS — R079 Chest pain, unspecified: Secondary | ICD-10-CM | POA: Diagnosis not present

## 2016-06-12 DIAGNOSIS — Z79899 Other long term (current) drug therapy: Secondary | ICD-10-CM | POA: Diagnosis not present

## 2016-06-12 DIAGNOSIS — M542 Cervicalgia: Secondary | ICD-10-CM | POA: Diagnosis not present

## 2016-06-13 DIAGNOSIS — I82499 Acute embolism and thrombosis of other specified deep vein of unspecified lower extremity: Secondary | ICD-10-CM | POA: Diagnosis not present

## 2016-06-13 DIAGNOSIS — Z23 Encounter for immunization: Secondary | ICD-10-CM | POA: Diagnosis not present

## 2016-07-10 DIAGNOSIS — M329 Systemic lupus erythematosus, unspecified: Secondary | ICD-10-CM | POA: Diagnosis not present

## 2016-07-10 DIAGNOSIS — G629 Polyneuropathy, unspecified: Secondary | ICD-10-CM | POA: Diagnosis not present

## 2016-07-10 DIAGNOSIS — K219 Gastro-esophageal reflux disease without esophagitis: Secondary | ICD-10-CM | POA: Diagnosis not present

## 2016-07-10 DIAGNOSIS — I10 Essential (primary) hypertension: Secondary | ICD-10-CM | POA: Diagnosis not present

## 2016-07-10 DIAGNOSIS — G2581 Restless legs syndrome: Secondary | ICD-10-CM | POA: Diagnosis not present

## 2016-07-13 DIAGNOSIS — M1611 Unilateral primary osteoarthritis, right hip: Secondary | ICD-10-CM | POA: Diagnosis not present

## 2016-07-13 DIAGNOSIS — G2581 Restless legs syndrome: Secondary | ICD-10-CM | POA: Diagnosis not present

## 2016-07-13 DIAGNOSIS — I82499 Acute embolism and thrombosis of other specified deep vein of unspecified lower extremity: Secondary | ICD-10-CM | POA: Diagnosis not present

## 2016-07-13 DIAGNOSIS — M1711 Unilateral primary osteoarthritis, right knee: Secondary | ICD-10-CM | POA: Diagnosis not present

## 2016-07-13 DIAGNOSIS — G629 Polyneuropathy, unspecified: Secondary | ICD-10-CM | POA: Diagnosis not present

## 2016-07-13 DIAGNOSIS — I10 Essential (primary) hypertension: Secondary | ICD-10-CM | POA: Diagnosis not present

## 2016-07-13 DIAGNOSIS — Z0000 Encounter for general adult medical examination without abnormal findings: Secondary | ICD-10-CM | POA: Diagnosis not present

## 2016-08-01 DIAGNOSIS — M3210 Systemic lupus erythematosus, organ or system involvement unspecified: Secondary | ICD-10-CM | POA: Diagnosis not present

## 2016-08-01 DIAGNOSIS — Z79899 Other long term (current) drug therapy: Secondary | ICD-10-CM | POA: Diagnosis not present

## 2016-08-01 DIAGNOSIS — H353131 Nonexudative age-related macular degeneration, bilateral, early dry stage: Secondary | ICD-10-CM | POA: Diagnosis not present

## 2016-08-01 DIAGNOSIS — H25813 Combined forms of age-related cataract, bilateral: Secondary | ICD-10-CM | POA: Diagnosis not present

## 2016-08-14 DIAGNOSIS — H25013 Cortical age-related cataract, bilateral: Secondary | ICD-10-CM | POA: Diagnosis not present

## 2016-08-14 DIAGNOSIS — H25811 Combined forms of age-related cataract, right eye: Secondary | ICD-10-CM | POA: Diagnosis not present

## 2016-08-14 DIAGNOSIS — H25812 Combined forms of age-related cataract, left eye: Secondary | ICD-10-CM | POA: Diagnosis not present

## 2016-08-22 DIAGNOSIS — Z1212 Encounter for screening for malignant neoplasm of rectum: Secondary | ICD-10-CM | POA: Diagnosis not present

## 2016-08-22 DIAGNOSIS — I82499 Acute embolism and thrombosis of other specified deep vein of unspecified lower extremity: Secondary | ICD-10-CM | POA: Diagnosis not present

## 2016-08-29 DIAGNOSIS — H2512 Age-related nuclear cataract, left eye: Secondary | ICD-10-CM | POA: Diagnosis not present

## 2016-08-29 DIAGNOSIS — H25812 Combined forms of age-related cataract, left eye: Secondary | ICD-10-CM | POA: Diagnosis not present

## 2016-08-30 DIAGNOSIS — L03032 Cellulitis of left toe: Secondary | ICD-10-CM | POA: Diagnosis not present

## 2016-08-30 DIAGNOSIS — H25812 Combined forms of age-related cataract, left eye: Secondary | ICD-10-CM | POA: Diagnosis not present

## 2016-08-30 DIAGNOSIS — Z6833 Body mass index (BMI) 33.0-33.9, adult: Secondary | ICD-10-CM | POA: Diagnosis not present

## 2016-09-11 DIAGNOSIS — K921 Melena: Secondary | ICD-10-CM | POA: Diagnosis not present

## 2016-09-11 NOTE — H&P (Signed)
  NTS SOAP Note  Vital Signs:  Vitals as of: 09/11/2016: Systolic 151: Diastolic 76: Heart Rate 67: Temp 98.78F (Temporal): Height 545ft 8in: Weight 217Lbs 0 Ounces: BMI 32.99   BMI : 32.99 kg/m2  Subjective: This 75 year old male presents for of melena.  Was referred by Dr. Dimas AguasHoward due to recent h/o melena.  Patient states this was noted several weeks ago.  Does not have blood in stools currently.  Denies any family h/o colon cancer, weight changes, abdominal pain, diarrhea, constipation.  States he has had hemorrhoidal issues in the past.  Last had a TCS 13 years ago.  Is on Coumadin for DVT in remote past.  Review of Symptoms:  Constitutional:negative Head:negative Eyes:negative Nose/Mouth/Throat:negative Cardiovascular:negative Respiratory:negative Gastrointestinnegative Genitourinary:negative joint, neck, and back pain Skin:negative Hematolgic/Lymphatic:easy bruising, easy bleeding Allergic/Immunologic:negative   Past Medical History:Reviewed  Past Medical History  Surgical History: 2 knee replacements, hernia surgery, filter placement Medical Problems: HTN, DVT 6 years ago, HTN Allergies: nkda Medications: coumadin, omeprazole, hydroxycholoroquine, lisinopril   Social History:Reviewed  Social History  Preferred Language: English Race:  White Ethnicity: Not Hispanic / Latino Age: [age] year Marital Status:  M Alcohol: no   Smoking Status: Former smoker reviewed on 09/11/2016 Started Date:  Stopped Date: 10/02/1983 Functional Status reviewed on 09/11/2016 ------------------------------------------------ Bathing: Normal Cooking: Normal Dressing: Normal Driving: Normal Eating: Normal Managing Meds: Normal Oral Care: Normal Shopping: Normal Toileting: Normal Transferring: Normal Walking: Normal Cognitive Status reviewed on 09/11/2016 ------------------------------------------------ Attention: Normal Decision Making: Normal Language:  Normal Memory: Normal Motor: Normal Perception: Normal Problem Solving: Normal Visual and Spatial: Normal   Family History:Reviewed  Family Health History Mother, Unknown; Stroke (CVA);  Father, Unknown; Heart disease;     Objective Information: General:Well appearing, well nourished in no distress. Skin:no rash or prominent lesions Head:Atraumatic; no masses; no abnormalities Neck:Supple without lymphadenopathy.  Heart:RRR, no murmur or gallop.  Normal S1, S2.  No S3, S4.  Lungs:CTA bilaterally, no wheezes, rhonchi, rales.  Breathing unlabored. Abdomen:Soft, NT/ND, normal bowel sounds, no HSM, no masses.  No peritoneal signs. deferred to procedure Dr. Jeannette HowHoward's notes reviewed. Assessment:Melena  Diagnoses: 578.1  K92.1 Melena (Melena)  Procedures: [id number] - OFFICE OUTPATIENT NEW 30 MINUTES    Plan:  Scheduled for TCS on 09/18/16.  Stop coumadin 12/16.  Trilyte prescribed.   Patient Education:Alternative treatments to surgery were discussed with patient (and family).Risks and benefits  of procedure including bleeding and perforation were fully explained to the patient (and family) who gave informed consent. Patient/family questions were addressed.  Follow-up:Pending Surgery

## 2016-09-12 DIAGNOSIS — Z6833 Body mass index (BMI) 33.0-33.9, adult: Secondary | ICD-10-CM | POA: Diagnosis not present

## 2016-09-12 DIAGNOSIS — S39012A Strain of muscle, fascia and tendon of lower back, initial encounter: Secondary | ICD-10-CM | POA: Diagnosis not present

## 2016-09-18 ENCOUNTER — Encounter (HOSPITAL_COMMUNITY)
Admission: RE | Disposition: A | Discharge status: Home / Self Care | Payer: Self-pay | Source: Ambulatory Visit | Attending: General Surgery

## 2016-09-18 ENCOUNTER — Encounter (HOSPITAL_COMMUNITY): Payer: Self-pay | Admitting: *Deleted

## 2016-09-18 ENCOUNTER — Ambulatory Visit (HOSPITAL_COMMUNITY)
Admission: RE | Admit: 2016-09-18 | Discharge: 2016-09-18 | Disposition: A | Discharge status: Home / Self Care | Payer: Commercial Managed Care - HMO | Source: Ambulatory Visit | Attending: General Surgery | Admitting: General Surgery

## 2016-09-18 DIAGNOSIS — I10 Essential (primary) hypertension: Secondary | ICD-10-CM | POA: Insufficient documentation

## 2016-09-18 DIAGNOSIS — Z79899 Other long term (current) drug therapy: Secondary | ICD-10-CM | POA: Diagnosis not present

## 2016-09-18 DIAGNOSIS — Z7901 Long term (current) use of anticoagulants: Secondary | ICD-10-CM | POA: Diagnosis not present

## 2016-09-18 DIAGNOSIS — Z87891 Personal history of nicotine dependence: Secondary | ICD-10-CM | POA: Insufficient documentation

## 2016-09-18 DIAGNOSIS — Z86718 Personal history of other venous thrombosis and embolism: Secondary | ICD-10-CM | POA: Diagnosis not present

## 2016-09-18 DIAGNOSIS — K921 Melena: Secondary | ICD-10-CM | POA: Diagnosis not present

## 2016-09-18 HISTORY — PX: COLONOSCOPY: SHX5424

## 2016-09-18 SURGERY — COLONOSCOPY
Anesthesia: Moderate Sedation

## 2016-09-18 MED ORDER — MIDAZOLAM HCL 5 MG/5ML IJ SOLN
INTRAMUSCULAR | Status: DC | PRN
  Administered 2016-09-18: 1 mg via INTRAVENOUS
  Administered 2016-09-18: 2 mg via INTRAVENOUS

## 2016-09-18 MED ORDER — SODIUM CHLORIDE 0.9 % IV SOLN
INTRAVENOUS | Status: DC
  Administered 2016-09-18: 07:00:00 via INTRAVENOUS

## 2016-09-18 MED ORDER — MIDAZOLAM HCL 5 MG/5ML IJ SOLN
INTRAMUSCULAR | Status: AC
  Filled 2016-09-18: qty 10

## 2016-09-18 MED ORDER — MEPERIDINE HCL 50 MG/ML IJ SOLN
INTRAMUSCULAR | Status: DC | PRN
  Administered 2016-09-18: 50 mg via INTRAVENOUS

## 2016-09-18 MED ORDER — MEPERIDINE HCL 50 MG/ML IJ SOLN
INTRAMUSCULAR | Status: AC
  Filled 2016-09-18: qty 1

## 2016-09-18 NOTE — Discharge Instructions (Signed)
Colonoscopy, Adult, Care After  This sheet gives you information about how to care for yourself after your procedure. Your health care provider may also give you more specific instructions. If you have problems or questions, contact your health care provider.  What can I expect after the procedure?  After the procedure, it is common to have:  · A small amount of blood in your stool for 24 hours after the procedure.  · Some gas.  · Mild abdominal cramping or bloating.    Follow these instructions at home:  General instructions     · For the first 24 hours after the procedure:  ? Do not drive or use machinery.  ? Do not sign important documents.  ? Do not drink alcohol.  ? Do your regular daily activities at a slower pace than normal.  ? Eat soft, easy-to-digest foods.  ? Rest often.  · Take over-the-counter or prescription medicines only as told by your health care provider.  · It is up to you to get the results of your procedure. Ask your health care provider, or the department performing the procedure, when your results will be ready.  Relieving cramping and bloating   · Try walking around when you have cramps or feel bloated.  · Apply heat to your abdomen as told by your health care provider. Use a heat source that your health care provider recommends, such as a moist heat pack or a heating pad.  ? Place a towel between your skin and the heat source.  ? Leave the heat on for 20-30 minutes.  ? Remove the heat if your skin turns bright red. This is especially important if you are unable to feel pain, heat, or cold. You may have a greater risk of getting burned.  Eating and drinking   · Drink enough fluid to keep your urine clear or pale yellow.  · Resume your normal diet as instructed by your health care provider. Avoid heavy or fried foods that are hard to digest.  · Avoid drinking alcohol for as long as instructed by your health care provider.  Contact a health care provider if:  · You have blood in your stool 2-3  days after the procedure.  Get help right away if:  · You have more than a small spotting of blood in your stool.  · You pass large blood clots in your stool.  · Your abdomen is swollen.  · You have nausea or vomiting.  · You have a fever.  · You have increasing abdominal pain that is not relieved with medicine.  This information is not intended to replace advice given to you by your health care provider. Make sure you discuss any questions you have with your health care provider.  Document Released: 05/01/2004 Document Revised: 06/11/2016 Document Reviewed: 11/29/2015  Elsevier Interactive Patient Education © 2017 [employer]

## 2016-09-18 NOTE — Interval H&P Note (Signed)
History and Physical Interval Note:  09/18/2016 7:25 AM  Jose Bush  has presented today for surgery, with the diagnosis of melena  The various methods of treatment have been discussed with the patient and family. After consideration of risks, benefits and other options for treatment, the patient has consented to  Procedure(s): COLONOSCOPY (N/A) as a surgical intervention .  The patient's history has been reviewed, patient examined, no change in status, stable for surgery.  I have reviewed the patient's chart and labs.  Questions were answered to the patient's satisfaction.     Franky MachoJENKINS,Korbyn Vanes A

## 2016-09-18 NOTE — Op Note (Signed)
[facility] Patient Name: Jose Bush Procedure Date: 09/18/2016 7:22 AM MRN: [id number] Date of Birth: 02/14/1941 Attending MD: Franky Macho , MD CSN: [id number] Age: 75 Admit Type: Outpatient Procedure:                Colonoscopy Indications:              Melena Providers:                Franky Macho, MD, Nena Polio, RN, Birder Robson,                            Technician Referring MD:              Medicines:                Midazolam 3 mg IV, Meperidine 50 mg IV Complications:            No immediate complications. Estimated blood loss:                            None. Estimated Blood Loss:     Estimated blood loss: none. Procedure:                Pre-Anesthesia Assessment:                           - Prior to the procedure, a History and Physical                            was performed, and patient medications and                            allergies were reviewed. The patient is competent.                            The risks and benefits of the procedure and the                            sedation options and risks were discussed with the                            patient. All questions were answered and informed                            consent was obtained. Patient identification and                            proposed procedure were verified by the physician                            and the nurse in the procedure room. Mental Status                            Examination: alert and oriented. Airway                            Examination: normal  oropharyngeal airway and neck                            mobility. Respiratory Examination: clear to                            auscultation. CV Examination: RRR, no murmurs, no                            S3 or S4. Prophylactic Antibiotics: The patient                            does not require prophylactic antibiotics. Prior                            Anticoagulants: The patient has taken Coumadin           (warfarin), last dose was 4 days prior to                            procedure. ASA Grade Assessment: III - A patient                            with severe systemic disease. After reviewing the                            risks and benefits, the patient was deemed in                            satisfactory condition to undergo the procedure.                            The anesthesia plan was to use moderate sedation /                            analgesia (conscious sedation). Immediately prior                            to administration of medications, the patient was                            re-assessed for adequacy to receive sedatives. The                            heart rate, respiratory rate, oxygen saturations,                            blood pressure, adequacy of pulmonary ventilation,                            and response to care were monitored throughout the                            procedure. The physical status of the patient was  re-assessed after the procedure.                           After obtaining informed consent, the colonoscope                            was passed under direct vision. Throughout the                            procedure, the patient's blood pressure, pulse, and                            oxygen saturations were monitored continuously. The                            [id number]) scope was introduced through                            the anus and advanced to the the cecum, identified                            by the appendiceal orifice, ileocecal valve and                            palpation. No anatomical landmarks were                            photographed. The colonoscopy was performed without                            difficulty. The patient tolerated the procedure                            well. The quality of the bowel preparation was                            adequate. The total duration of  the procedure was                            14 minutes. Scope In: 7:30:28 AM Scope Out: 7:41:04 AM Scope Withdrawal Time: 0 hours 6 minutes 51 seconds  Total Procedure Duration: 0 hours 10 minutes 36 seconds  Findings:      The perianal and digital rectal examinations were normal.      The entire examined colon appeared normal on direct and retroflexion       views. Impression:               - The entire examined colon is normal on direct and                            retroflexion views.                           - No specimens collected. Moderate Sedation:      Moderate (conscious) sedation was administered by the  endoscopy nurse       and supervised by the endoscopist. The following parameters were       monitored: oxygen saturation, heart rate, blood pressure, and response       to care. Total physician intraservice time was 14 minutes. Recommendation:           - Written discharge instructions were provided to                            the patient.                           - The signs and symptoms of potential delayed                            complications were discussed with the patient.                           - Patient has a contact number available for                            emergencies.                           - Return to normal activities tomorrow.                           - Resume previous diet.                           - Continue present medications.                           - Post-Procedure Resumption of Anticoagulants:                            Restart warfarin today [Dose] [Route] [Frequency]                            [Days Before INR] [Dosing] [INR Range].                           - No repeat colonoscopy due to age. Procedure Code(s):        --- Professional ---                           [phone], Colonoscopy, flexible; diagnostic, including                            collection of specimen(s) by brushing or washing,                            when  performed (separate procedure)                           G0500, Moderate sedation services provided by the  same physician or other qualified health care                            professional performing a gastrointestinal                            endoscopic service that sedation supports,                            requiring the presence of an independent trained                            observer to assist in the monitoring of the                            patient's level of consciousness and physiological                            status; initial 15 minutes of intra-service time;                            patient age 78 years or older (additional time may                            be reported with [id number], as appropriate) Diagnosis Code(s):        --- Professional ---                           K92.1, Melena (includes Hematochezia) CPT copyright 2016 American [employer] Association. All rights reserved. The codes documented in this report are preliminary and upon coder review may  be revised to meet current compliance requirements. Franky MachoMark Brileigh Sevcik, MD Franky MachoMark Whitney Hillegass, MD 09/18/2016 7:45:37 AM This report has been signed electronically. Number of Addenda: 0

## 2016-09-20 ENCOUNTER — Encounter (HOSPITAL_COMMUNITY): Payer: Self-pay | Admitting: General Surgery

## 2016-09-20 DIAGNOSIS — H2511 Age-related nuclear cataract, right eye: Secondary | ICD-10-CM | POA: Diagnosis not present

## 2016-09-20 DIAGNOSIS — H25811 Combined forms of age-related cataract, right eye: Secondary | ICD-10-CM | POA: Diagnosis not present

## 2016-09-20 HISTORY — PX: CATARACT EXTRACTION: SUR2

## 2016-09-25 DIAGNOSIS — I82499 Acute embolism and thrombosis of other specified deep vein of unspecified lower extremity: Secondary | ICD-10-CM | POA: Diagnosis not present

## 2016-10-07 ENCOUNTER — Other Ambulatory Visit (HOSPITAL_COMMUNITY)
Admission: RE | Admit: 2016-10-07 | Discharge: 2016-10-07 | Disposition: A | Discharge status: Home / Self Care | Payer: Commercial Managed Care - HMO | Source: Other Acute Inpatient Hospital | Attending: Ophthalmology | Admitting: Ophthalmology

## 2016-10-07 ENCOUNTER — Other Ambulatory Visit: Payer: Self-pay | Admitting: Ophthalmology

## 2016-10-07 DIAGNOSIS — H44001 Unspecified purulent endophthalmitis, right eye: Secondary | ICD-10-CM | POA: Insufficient documentation

## 2016-10-10 DIAGNOSIS — I82499 Acute embolism and thrombosis of other specified deep vein of unspecified lower extremity: Secondary | ICD-10-CM | POA: Diagnosis not present

## 2016-10-10 LAB — EYE CULTURE

## 2016-10-15 DIAGNOSIS — L723 Sebaceous cyst: Secondary | ICD-10-CM | POA: Diagnosis not present

## 2016-10-23 DIAGNOSIS — I82499 Acute embolism and thrombosis of other specified deep vein of unspecified lower extremity: Secondary | ICD-10-CM | POA: Diagnosis not present

## 2016-10-30 DIAGNOSIS — I82499 Acute embolism and thrombosis of other specified deep vein of unspecified lower extremity: Secondary | ICD-10-CM | POA: Diagnosis not present

## 2016-11-06 DIAGNOSIS — H59031 Cystoid macular edema following cataract surgery, right eye: Secondary | ICD-10-CM | POA: Diagnosis not present

## 2016-11-06 DIAGNOSIS — I82499 Acute embolism and thrombosis of other specified deep vein of unspecified lower extremity: Secondary | ICD-10-CM | POA: Diagnosis not present

## 2016-12-04 DIAGNOSIS — H353131 Nonexudative age-related macular degeneration, bilateral, early dry stage: Secondary | ICD-10-CM | POA: Diagnosis not present

## 2016-12-18 DIAGNOSIS — I82499 Acute embolism and thrombosis of other specified deep vein of unspecified lower extremity: Secondary | ICD-10-CM | POA: Diagnosis not present

## 2017-01-08 DIAGNOSIS — I82499 Acute embolism and thrombosis of other specified deep vein of unspecified lower extremity: Secondary | ICD-10-CM | POA: Diagnosis not present

## 2017-01-08 DIAGNOSIS — Z6831 Body mass index (BMI) 31.0-31.9, adult: Secondary | ICD-10-CM | POA: Diagnosis not present

## 2017-01-08 DIAGNOSIS — I10 Essential (primary) hypertension: Secondary | ICD-10-CM | POA: Diagnosis not present

## 2017-01-08 DIAGNOSIS — G2581 Restless legs syndrome: Secondary | ICD-10-CM | POA: Diagnosis not present

## 2017-01-29 DIAGNOSIS — I82499 Acute embolism and thrombosis of other specified deep vein of unspecified lower extremity: Secondary | ICD-10-CM | POA: Diagnosis not present

## 2017-02-01 DIAGNOSIS — Z96652 Presence of left artificial knee joint: Secondary | ICD-10-CM | POA: Diagnosis not present

## 2017-02-01 DIAGNOSIS — Z471 Aftercare following joint replacement surgery: Secondary | ICD-10-CM | POA: Diagnosis not present

## 2017-02-01 DIAGNOSIS — M1611 Unilateral primary osteoarthritis, right hip: Secondary | ICD-10-CM | POA: Diagnosis not present

## 2017-02-01 DIAGNOSIS — Z96653 Presence of artificial knee joint, bilateral: Secondary | ICD-10-CM | POA: Diagnosis not present

## 2017-02-01 DIAGNOSIS — Z96651 Presence of right artificial knee joint: Secondary | ICD-10-CM | POA: Diagnosis not present

## 2017-02-12 DIAGNOSIS — H5989 Other postprocedural complications and disorders of eye and adnexa, not elsewhere classified: Secondary | ICD-10-CM | POA: Diagnosis not present

## 2017-02-28 DIAGNOSIS — H04123 Dry eye syndrome of bilateral lacrimal glands: Secondary | ICD-10-CM | POA: Diagnosis not present

## 2017-02-28 DIAGNOSIS — H1132 Conjunctival hemorrhage, left eye: Secondary | ICD-10-CM | POA: Diagnosis not present

## 2017-03-12 DIAGNOSIS — H1131 Conjunctival hemorrhage, right eye: Secondary | ICD-10-CM | POA: Diagnosis not present

## 2017-03-12 DIAGNOSIS — H5989 Other postprocedural complications and disorders of eye and adnexa, not elsewhere classified: Secondary | ICD-10-CM | POA: Diagnosis not present

## 2017-03-13 DIAGNOSIS — I82499 Acute embolism and thrombosis of other specified deep vein of unspecified lower extremity: Secondary | ICD-10-CM | POA: Diagnosis not present

## 2017-03-18 DIAGNOSIS — Z0100 Encounter for examination of eyes and vision without abnormal findings: Secondary | ICD-10-CM | POA: Diagnosis not present

## 2017-04-08 NOTE — Progress Notes (Signed)
Please place orders in EPIC as patient is being scheduled for a pre-op appointment! Thank you! 

## 2017-04-09 ENCOUNTER — Ambulatory Visit: Payer: Self-pay | Admitting: Orthopedic Surgery

## 2017-04-09 DIAGNOSIS — H5989 Other postprocedural complications and disorders of eye and adnexa, not elsewhere classified: Secondary | ICD-10-CM | POA: Diagnosis not present

## 2017-04-11 DIAGNOSIS — Z6832 Body mass index (BMI) 32.0-32.9, adult: Secondary | ICD-10-CM | POA: Diagnosis not present

## 2017-04-11 DIAGNOSIS — I82503 Chronic embolism and thrombosis of unspecified deep veins of lower extremity, bilateral: Secondary | ICD-10-CM | POA: Diagnosis not present

## 2017-04-24 DIAGNOSIS — I82499 Acute embolism and thrombosis of other specified deep vein of unspecified lower extremity: Secondary | ICD-10-CM | POA: Diagnosis not present

## 2017-04-24 NOTE — Patient Instructions (Signed)
Jose Bush  04/24/2017   Your procedure is scheduled on: 05/01/2017    Report to [facility] Main  Entrance   Report to admitting at  0600 AM   Call this number if you have problems the morning of surgery  [phone]   Remember: ONLY 1 PERSON MAY GO WITH YOU TO SHORT STAY TO GET  READY MORNING OF YOUR SURGERY.  Do not eat food or drink liquids :After Midnight.     Take these medicines the morning of surgery with A SIP OF WATER: Prilosec                                 You may not have any metal on your body including hair pins and              piercings  Do not wear jewelry, , lotions, powders or perfumes, deodorant                      Men may shave face and neck.   Do not bring valuables to the hospital. [facility] IS NOT             RESPONSIBLE   FOR VALUABLES.  Contacts, dentures or bridgework may not be worn into surgery.  Leave suitcase in the car. After surgery it may be brought to your room.                     Please read over the following fact sheets you were given: _____________________________________________________________________             [facility] Pre[facility] Before surgery, you can play an important role.  Because skin is not sterile, your skin needs to be as free of germs as possible.  You can reduce the number of germs on your skin by washing with CHG (chlorahexidine gluconate) soap before surgery.  CHG is an antiseptic cleaner which kills germs and bonds with the skin to continue killing germs even after washing. Please DO NOT use if you have an allergy to CHG or antibacterial soaps.  If your skin becomes reddened/irritated stop using the CHG and inform your nurse when you arrive at Short Stay. Do not shave (including legs and underarms) for at least 48 hours prior to the first CHG shower.  You may shave your face/neck. Please follow these instructions carefully:  1.  Shower with CHG Soap the night before  surgery and the  morning of Surgery.  2.  If you choose to wash your hair, wash your hair first as usual with your  normal  shampoo.  3.  After you shampoo, rinse your hair and body thoroughly to remove the  shampoo.                           4.  Use CHG as you would any other liquid soap.  You can apply chg directly  to the skin and wash                       Gently with a scrungie or clean washcloth.  5.  Apply the CHG Soap to your body ONLY FROM THE NECK DOWN.   Do not use on face/ open  Wound or open sores. Avoid contact with eyes, ears mouth and genitals (private parts).                       Wash face,  Genitals (private parts) with your normal soap.             6.  Wash thoroughly, paying special attention to the area where your surgery  will be performed.  7.  Thoroughly rinse your body with warm water from the neck down.  8.  DO NOT shower/wash with your normal soap after using and rinsing off  the CHG Soap.                9.  Pat yourself dry with a clean towel.            10.  Wear clean pajamas.            11.  Place clean sheets on your bed the night of your first shower and do not  sleep with pets. Day of Surgery : Do not apply any lotions/deodorants the morning of surgery.  Please wear clean clothes to the hospital/surgery center.  FAILURE TO FOLLOW THESE INSTRUCTIONS MAY RESULT IN THE CANCELLATION OF YOUR SURGERY PATIENT SIGNATURE_________________________________  NURSE SIGNATURE__________________________________  ________________________________________________________________________  WHAT IS A BLOOD TRANSFUSION? Blood Transfusion Information  A transfusion is the replacement of blood or some of its parts. Blood is made up of multiple cells which provide different functions.  Red blood cells carry oxygen and are used for blood loss replacement.  White blood cells fight against infection.  Platelets control bleeding.  Plasma helps clot  blood.  Other blood products are available for specialized needs, such as hemophilia or other clotting disorders. BEFORE THE TRANSFUSION  Who gives blood for transfusions?   Healthy volunteers who are fully evaluated to make sure their blood is safe. This is blood bank blood. Transfusion therapy is the safest it has ever been in the practice of medicine. Before blood is taken from a donor, a complete history is taken to make sure that person has no history of diseases nor engages in risky social behavior (examples are intravenous drug use or sexual activity with multiple partners). The donor's travel history is screened to minimize risk of transmitting infections, such as malaria. The donated blood is tested for signs of infectious diseases, such as HIV and hepatitis. The blood is then tested to be sure it is compatible with you in order to minimize the chance of a transfusion reaction. If you or a relative donates blood, this is often done in anticipation of surgery and is not appropriate for emergency situations. It takes many days to process the donated blood. RISKS AND COMPLICATIONS Although transfusion therapy is very safe and saves many lives, the main dangers of transfusion include:   Getting an infectious disease.  Developing a transfusion reaction. This is an allergic reaction to something in the blood you were given. Every precaution is taken to prevent this. The decision to have a blood transfusion has been considered carefully by your caregiver before blood is given. Blood is not given unless the benefits outweigh the risks. AFTER THE TRANSFUSION  Right after receiving a blood transfusion, you will usually feel much better and more energetic. This is especially true if your red blood cells have gotten low (anemic). The transfusion raises the level of the red blood cells which carry oxygen, and this usually causes an energy increase.  The  nurse administering the transfusion will monitor  you carefully for complications. HOME CARE INSTRUCTIONS  No special instructions are needed after a transfusion. You may find your energy is better. Speak with your caregiver about any limitations on activity for underlying diseases you may have. SEEK MEDICAL CARE IF:   Your condition is not improving after your transfusion.  You develop redness or irritation at the intravenous (IV) site. SEEK IMMEDIATE MEDICAL CARE IF:  Any of the following symptoms occur over the next 12 hours:  Shaking chills.  You have a temperature by mouth above 102 F (38.9 C), not controlled by medicine.  Chest, back, or muscle pain.  People around you feel you are not acting correctly or are confused.  Shortness of breath or difficulty breathing.  Dizziness and fainting.  You get a rash or develop hives.  You have a decrease in urine output.  Your urine turns a dark color or changes to pink, red, or brown. Any of the following symptoms occur over the next 10 days:  You have a temperature by mouth above 102 F (38.9 C), not controlled by medicine.  Shortness of breath.  Weakness after normal activity.  The white part of the eye turns yellow (jaundice).  You have a decrease in the amount of urine or are urinating less often.  Your urine turns a dark color or changes to pink, red, or brown. Document Released: 09/14/2000 Document Revised: 12/10/2011 Document Reviewed: 05/03/2008 ExitCare Patient Information 2014 [place].  _______________________________________________________________________  Incentive Spirometer  An incentive spirometer is a tool that can help keep your lungs clear and active. This tool measures how well you are filling your lungs with each breath. Taking long deep breaths may help reverse or decrease the chance of developing breathing (pulmonary) problems (especially infection) following:  A long period of time when you are unable to move or be active. BEFORE THE  PROCEDURE   If the spirometer includes an indicator to show your best effort, your nurse or respiratory therapist will set it to a desired goal.  If possible, sit up straight or lean slightly forward. Try not to slouch.  Hold the incentive spirometer in an upright position. INSTRUCTIONS FOR USE  1. Sit on the edge of your bed if possible, or sit up as far as you can in bed or on a chair. 2. Hold the incentive spirometer in an upright position. 3. Breathe out normally. 4. Place the mouthpiece in your mouth and seal your lips tightly around it. 5. Breathe in slowly and as deeply as possible, raising the piston or the ball toward the top of the column. 6. Hold your breath for 3-5 seconds or for as long as possible. Allow the piston or ball to fall to the bottom of the column. 7. Remove the mouthpiece from your mouth and breathe out normally. 8. Rest for a few seconds and repeat Steps 1 through 7 at least 10 times every 1-2 hours when you are awake. Take your time and take a few normal breaths between deep breaths. 9. The spirometer may include an indicator to show your best effort. Use the indicator as a goal to work toward during each repetition. 10. After each set of 10 deep breaths, practice coughing to be sure your lungs are clear. If you have an incision (the cut made at the time of surgery), support your incision when coughing by placing a pillow or rolled up towels firmly against it. Once you are able to get  out of bed, walk around indoors and cough well. You may stop using the incentive spirometer when instructed by your caregiver.  RISKS AND COMPLICATIONS  Take your time so you do not get dizzy or light-headed.  If you are in pain, you may need to take or ask for pain medication before doing incentive spirometry. It is harder to take a deep breath if you are having pain. AFTER USE  Rest and breathe slowly and easily.  It can be helpful to keep track of a log of your progress. Your  caregiver can provide you with a simple table to help with this. If you are using the spirometer at home, follow these instructions: [place] IF:   You are having difficultly using the spirometer.  You have trouble using the spirometer as often as instructed.  Your pain medication is not giving enough relief while using the spirometer.  You develop fever of 100.5 F (38.1 C) or higher. SEEK IMMEDIATE MEDICAL CARE IF:   You cough up bloody sputum that had not been present before.  You develop fever of 102 F (38.9 C) or greater.  You develop worsening pain at or near the incision site. MAKE SURE YOU:   Understand these instructions.  Will watch your condition.  Will get help right away if you are not doing well or get worse. Document Released: 01/28/2007 Document Revised: 12/10/2011 Document Reviewed: 03/31/2007 [employer] Patient Information 2014 [place], [place].   ________________________________________________________________________

## 2017-04-26 ENCOUNTER — Encounter (HOSPITAL_COMMUNITY)
Admission: RE | Admit: 2017-04-26 | Discharge: 2017-04-26 | Disposition: A | Discharge status: Home / Self Care | Payer: Medicare HMO | Source: Ambulatory Visit | Attending: Orthopedic Surgery | Admitting: Orthopedic Surgery

## 2017-04-26 ENCOUNTER — Encounter (HOSPITAL_COMMUNITY): Payer: Self-pay

## 2017-04-26 DIAGNOSIS — Z01810 Encounter for preprocedural cardiovascular examination: Secondary | ICD-10-CM | POA: Insufficient documentation

## 2017-04-26 DIAGNOSIS — M1611 Unilateral primary osteoarthritis, right hip: Secondary | ICD-10-CM | POA: Insufficient documentation

## 2017-04-26 DIAGNOSIS — Z01818 Encounter for other preprocedural examination: Secondary | ICD-10-CM | POA: Diagnosis not present

## 2017-04-26 DIAGNOSIS — I440 Atrioventricular block, first degree: Secondary | ICD-10-CM | POA: Insufficient documentation

## 2017-04-26 LAB — SURGICAL PCR SCREEN
MRSA, PCR: NEGATIVE
STAPHYLOCOCCUS AUREUS: NEGATIVE

## 2017-04-26 LAB — CBC
HCT: 39.7 % (ref 39.0–52.0)
Hemoglobin: 13.5 g/dL (ref 13.0–17.0)
MCH: 29.1 pg (ref 26.0–34.0)
MCHC: 34 g/dL (ref 30.0–36.0)
MCV: 85.6 fL (ref 78.0–100.0)
PLATELETS: 167 10*3/uL (ref 150–400)
RBC: 4.64 MIL/uL (ref 4.22–5.81)
RDW: 15.3 % (ref 11.5–15.5)
WBC: 5.4 10*3/uL (ref 4.0–10.5)

## 2017-04-26 LAB — COMPREHENSIVE METABOLIC PANEL
ALBUMIN: 4.1 g/dL (ref 3.5–5.0)
ALT: 37 U/L (ref 17–63)
AST: 42 U/L — AB (ref 15–41)
Alkaline Phosphatase: 48 U/L (ref 38–126)
Anion gap: 10 (ref 5–15)
BUN: 19 mg/dL (ref 6–20)
CHLORIDE: 105 mmol/L (ref 101–111)
CO2: 24 mmol/L (ref 22–32)
Calcium: 9.3 mg/dL (ref 8.9–10.3)
Creatinine, Ser: 0.77 mg/dL (ref 0.61–1.24)
GFR calc Af Amer: >60 mL/min (ref >60)
Glucose, Bld: 86 mg/dL (ref 65–99)
POTASSIUM: 4.2 mmol/L (ref 3.5–5.1)
SODIUM: 139 mmol/L (ref 135–145)
Total Bilirubin: 0.6 mg/dL (ref 0.3–1.2)
Total Protein: 7.2 g/dL (ref 6.5–8.1)

## 2017-04-26 LAB — APTT: APTT: 41 s — AB (ref 24–36)

## 2017-04-26 LAB — PROTIME-INR
INR: 1.85
PROTHROMBIN TIME: 21.6 s — AB (ref 11.4–15.2)

## 2017-04-26 NOTE — Progress Notes (Signed)
Final EKG done 04/26/17-in epic

## 2017-04-26 NOTE — Progress Notes (Signed)
PT and PTT faxed via epic to Dr Lequita HaltAluisio.   Will redraw PT/INR am of surgery .

## 2017-04-26 NOTE — Progress Notes (Addendum)
LOV-DR Caryn BeeKevin Howard-04/11/17-PCP on chart - clearance in note

## 2017-04-30 ENCOUNTER — Ambulatory Visit: Payer: Self-pay | Admitting: Orthopedic Surgery

## 2017-04-30 NOTE — H&P (Deleted)
  The note originally documented on this encounter has been moved the the encounter in which it belongs.  

## 2017-04-30 NOTE — H&P (Signed)
Jose Bush DOB: [birth year] Married / Language: English / Race: White Male Date of Admission:  05/01/2017 CC:  Right hip pain History of Present Illness  The patient is a 76 year old male who comes in  for a preoperative History and Physical. The patient is scheduled for a right total hip arthroplasty (anterior) to be performed by Dr. Gus RankinFrank V. Aluisio, MD at [facility] on 05/01/2017. The patient is a 76 year old male who presented for follow up of their hip. The patient is being followed for their right hip pain and osteoarthritis. They are over a year out from intra-articular injection. Symptoms reported include: pain, aching, stiffness (worsened over the past 3-4 months), grinding and difficulty ambulating. The patient feels that they are doing poorly and report their pain level to be moderate. The following medication has been used for pain control: Tylenol. The patient has reported improvement of their symptoms with: Cortisone injections (but really only helped for a few weeks). He notes that he has started to notice some occasional pain in the left hip as well. He would like to go ahead and proceed witht the right hip replacement. Unfortunately, his right hip is getting progressively worse. He has had more functional limitations and more pain. It is starting to limit what he can and cannot do. It is keeping him awake at night. He has some pain in the left hip, but nowhere near as bad as the right. They have been treated conservatively in the past for the above stated problem and despite conservative measures, they continue to have progressive pain and severe functional limitations and dysfunction. They have failed non-operative management including home exercise, medications, and injections. It is felt that they would benefit from undergoing total joint replacement. Risks and benefits of the procedure have been discussed with the patient and they elect to proceed with surgery. There are  no active contraindications to surgery such as ongoing infection or rapidly progressive neurological disease.   Problem List/Past Medical  Degeneration of intervertebral disc at C5-C6 level (M50.322)  Primary osteoarthritis of left knee (M17.12)  Primary osteoarthritis of right hip (M16.11)  Status post total right knee replacement (Z96.651)  High blood pressure  Gastroesophageal Reflux Disease  Blood Clot  DVT Left Leg Lupus Anticoagulant Disorder  Heart murmur  Shingles  Hiatal Hernia  Cerebrovascular Accident  Ulcer disease  Tinnitus  Dentures  Hemorrhoids  Enlarged prostate    Allergies  No Known Drug Allergies  Family History Congestive Heart Failure  Father. Cerebrovascular Accident  Mother. Father  Deceased. age 77; Congestive heart failure Mother  Deceased. age 77  Social History Tobacco use  Former smoker. 11/23/2013: smoke(d) 1/2 pack(s) per day Former drinker  11/23/2013: In the past drank Current work status  retired Tobacco / smoke exposure  11/23/2013: no No history of drug/alcohol rehab  Living situation  live with spouse Exercise  Exercises weekly; does running / walking and other Not under pain contract  Marital status  married Children  2 Advance Directives  Living Will, Healthcare POA Post-Surgical Plans  Home With Family.  Medication History Tylenol (500MG  Capsule, Oral) Active. Claritin (10MG  Tablet, Oral) Active. Plaquenil (200MG  Tablet, Oral) Active. Warfarin Sodium (5MG  Tablet, Oral) Active. Omeprazole (20MG  Capsule DR, Oral) Active. Lisinopril-Hydrochlorothiazide (10-12.5MG  Tablet, Oral) Active. Aspirin EC (81MG  Tablet DR, Oral) Active. Centrum Silver 50+Men (Oral) Active. Stool Softener (100MG  Capsule, Oral) Active. Ester C (Oral) Active. Krill Oil Omega-3 (Oral) Specific strength unknown - Active.  Past Surgical  History Arthroscopy of Knee  Date: 2007. right Vena Cava Filter Placement   Date: 01/2013. Inguinal Hernia Repair  Date: 2010. laparoscopic: right Total Knee Replacement - Left  Date: 2016. Cataract Extraction-Bilateral   Review of Systems General Not Present- Chills, Fatigue, Fever, Memory Loss, Night Sweats, Weight Gain and Weight Loss. Skin Not Present- Eczema, Hives, Itching, Lesions and Rash. HEENT Not Present- Dentures, Double Vision, Headache, Hearing Loss, Tinnitus and Visual Loss. Respiratory Not Present- Allergies, Chronic Cough, Coughing up blood, Shortness of breath at rest and Shortness of breath with exertion. Cardiovascular Not Present- Chest Pain, Difficulty Breathing Lying Down, Murmur, Palpitations, Racing/skipping heartbeats and Swelling. Gastrointestinal Not Present- Abdominal Pain, Bloody Stool, Constipation, Diarrhea, Difficulty Swallowing, Heartburn, Jaundice, Loss of appetitie, Nausea and Vomiting. Male Genitourinary Not Present- Blood in Urine, Discharge, Flank Pain, Incontinence, Painful Urination, Urgency, Urinary frequency, Urinary Retention, Urinating at Night and Weak urinary stream. Musculoskeletal Present- Joint Pain. Not Present- Back Pain, Joint Swelling, Morning Stiffness, Muscle Pain, Muscle Weakness and Spasms. Neurological Not Present- Blackout spells, Difficulty with balance, Dizziness, Paralysis, Tremor and Weakness. Psychiatric Not Present- Insomnia.  Vitals Weight: 212 lb Height: 68in Weight was reported by patient. Height was reported by patient. Body Surface Area: 2.1 m Body Mass Index: 32.23 kg/m  Pulse: 76 (Regular)  BP: 132/62 (Sitting, Left Arm, Standard)   Physical Exam  General Mental Status -Alert, cooperative and good historian. General Appearance-pleasant, Not in acute distress. Orientation-Oriented X3. Build & Nutrition-Well nourished and Well developed.  Head and Neck Head-normocephalic, atraumatic . Neck Global Assessment - supple, no bruit auscultated on the right, no bruit  auscultated on the left.  Eye Pupil - Bilateral-Regular and Round. Motion - Bilateral-EOMI.  ENMT Note: upper and lower dentures   Chest and Lung Exam Auscultation Breath sounds - clear at anterior chest wall and clear at posterior chest wall. Adventitious sounds - No Adventitious sounds.  Cardiovascular Auscultation Rhythm - Regular rate and rhythm. Heart Sounds - S1 WNL and S2 WNL. Murmurs & Other Heart Sounds: Murmur 1 - Location - Aortic Area and Tricuspid Area. Timing - Mid-systolic. Grade - II/VI.  Abdomen Inspection Contour - Generalized moderate distention. Palpation/Percussion Tenderness - Abdomen is non-tender to palpation. Rigidity (guarding) - Abdomen is soft. Auscultation Auscultation of the abdomen reveals - Bowel sounds normal.  Male Genitourinary Note: Not done, not pertinent to present illness   Musculoskeletal Note: His right can be flexed to 90 with no internal rotation, about 20 of external rotation and 10 of abduction. His gait pattern is significantly antalgic on the right. Left hip can be flexed to 110, rotated in 20, out 30, abduct 30 without discomfort. Both knees show no swelling. Range about 0 to 125 on each side. There is no tenderness or instability.   Assessment & Plan  Status post total left knee replacement (Z61.096(Z96.652) Status post total right knee replacement (E45.409(Z96.651) Primary osteoarthritis of right hip (M16.11)  Note:Surgical Plans: Right Total Hip Replacement - Anterior Approach  Disposition: Home with family, HHPT  PCP: Dr. Selinda FlavinKevin howard - pending  Topical TXA - History of Blood Clot  Anesthesia Issues: None  Patient was instructed on what medications to stop prior to surgery.  Signed electronically by Lauraine RinneAlexzandrew L Bulah Lurie, III PA-C

## 2017-05-01 ENCOUNTER — Inpatient Hospital Stay (HOSPITAL_COMMUNITY): Payer: Medicare HMO

## 2017-05-01 ENCOUNTER — Inpatient Hospital Stay (HOSPITAL_COMMUNITY)
Admission: RE | Admit: 2017-05-01 | Discharge: 2017-05-03 | DRG: 470 | Disposition: A | Discharge status: Home Health | Payer: Medicare HMO | Source: Ambulatory Visit | Attending: Orthopedic Surgery | Admitting: Orthopedic Surgery

## 2017-05-01 ENCOUNTER — Encounter (HOSPITAL_COMMUNITY)
Admission: RE | Disposition: A | Discharge status: Home Health | Payer: Self-pay | Source: Ambulatory Visit | Attending: Orthopedic Surgery

## 2017-05-01 ENCOUNTER — Inpatient Hospital Stay (HOSPITAL_COMMUNITY): Payer: Medicare HMO | Admitting: Anesthesiology

## 2017-05-01 ENCOUNTER — Encounter (HOSPITAL_COMMUNITY): Payer: Self-pay | Admitting: *Deleted

## 2017-05-01 DIAGNOSIS — M50322 Other cervical disc degeneration at C5-C6 level: Secondary | ICD-10-CM | POA: Diagnosis present

## 2017-05-01 DIAGNOSIS — I10 Essential (primary) hypertension: Secondary | ICD-10-CM | POA: Diagnosis present

## 2017-05-01 DIAGNOSIS — Z8673 Personal history of transient ischemic attack (TIA), and cerebral infarction without residual deficits: Secondary | ICD-10-CM | POA: Diagnosis not present

## 2017-05-01 DIAGNOSIS — K449 Diaphragmatic hernia without obstruction or gangrene: Secondary | ICD-10-CM | POA: Diagnosis present

## 2017-05-01 DIAGNOSIS — Z87891 Personal history of nicotine dependence: Secondary | ICD-10-CM

## 2017-05-01 DIAGNOSIS — N400 Enlarged prostate without lower urinary tract symptoms: Secondary | ICD-10-CM | POA: Diagnosis present

## 2017-05-01 DIAGNOSIS — R112 Nausea with vomiting, unspecified: Secondary | ICD-10-CM | POA: Diagnosis not present

## 2017-05-01 DIAGNOSIS — D6862 Lupus anticoagulant syndrome: Secondary | ICD-10-CM | POA: Diagnosis not present

## 2017-05-01 DIAGNOSIS — K219 Gastro-esophageal reflux disease without esophagitis: Secondary | ICD-10-CM | POA: Diagnosis present

## 2017-05-01 DIAGNOSIS — M1611 Unilateral primary osteoarthritis, right hip: Secondary | ICD-10-CM | POA: Diagnosis not present

## 2017-05-01 DIAGNOSIS — Z96641 Presence of right artificial hip joint: Secondary | ICD-10-CM | POA: Diagnosis not present

## 2017-05-01 DIAGNOSIS — Z79899 Other long term (current) drug therapy: Secondary | ICD-10-CM | POA: Diagnosis not present

## 2017-05-01 DIAGNOSIS — R531 Weakness: Secondary | ICD-10-CM | POA: Diagnosis not present

## 2017-05-01 DIAGNOSIS — Z86718 Personal history of other venous thrombosis and embolism: Secondary | ICD-10-CM

## 2017-05-01 DIAGNOSIS — R079 Chest pain, unspecified: Secondary | ICD-10-CM | POA: Diagnosis not present

## 2017-05-01 DIAGNOSIS — R011 Cardiac murmur, unspecified: Secondary | ICD-10-CM | POA: Diagnosis present

## 2017-05-01 DIAGNOSIS — Z7901 Long term (current) use of anticoagulants: Secondary | ICD-10-CM | POA: Diagnosis not present

## 2017-05-01 DIAGNOSIS — Z7982 Long term (current) use of aspirin: Secondary | ICD-10-CM | POA: Diagnosis not present

## 2017-05-01 DIAGNOSIS — Z96649 Presence of unspecified artificial hip joint: Secondary | ICD-10-CM

## 2017-05-01 DIAGNOSIS — Z471 Aftercare following joint replacement surgery: Secondary | ICD-10-CM | POA: Diagnosis not present

## 2017-05-01 DIAGNOSIS — M169 Osteoarthritis of hip, unspecified: Secondary | ICD-10-CM | POA: Diagnosis present

## 2017-05-01 HISTORY — PX: TOTAL HIP ARTHROPLASTY: SHX124

## 2017-05-01 LAB — TYPE AND SCREEN
ABO/RH(D): O POS
ANTIBODY SCREEN: NEGATIVE

## 2017-05-01 LAB — PROTIME-INR
INR: 1.01
PROTHROMBIN TIME: 13.3 s (ref 11.4–15.2)

## 2017-05-01 SURGERY — ARTHROPLASTY, HIP, TOTAL, ANTERIOR APPROACH
Anesthesia: Spinal | Site: Hip | Laterality: Right

## 2017-05-01 MED ORDER — MENTHOL 3 MG MT LOZG: 1.0000 | LOZENGE | OROMUCOSAL | Status: DC | PRN

## 2017-05-01 MED ORDER — ACETAMINOPHEN 325 MG PO TABS: 650.0000 mg | ORAL_TABLET | Freq: Four times a day (QID) | ORAL | Status: DC | PRN

## 2017-05-01 MED ORDER — TRAMADOL HCL 50 MG PO TABS
50.0000 mg | ORAL_TABLET | Freq: Four times a day (QID) | ORAL | Status: DC | PRN
  Administered 2017-05-01: 23:00:00 100 mg via ORAL
  Filled 2017-05-01: qty 2

## 2017-05-01 MED ORDER — CHLORHEXIDINE GLUCONATE 4 % EX LIQD: 60.0000 mL | Freq: Once | CUTANEOUS | Status: DC

## 2017-05-01 MED ORDER — TRANEXAMIC ACID 1000 MG/10ML IV SOLN
INTRAVENOUS | Status: AC | PRN
  Administered 2017-05-01: 2000 mg via TOPICAL

## 2017-05-01 MED ORDER — BISACODYL 10 MG RE SUPP: 10.0000 mg | Freq: Every day | RECTAL | Status: DC | PRN

## 2017-05-01 MED ORDER — CEFAZOLIN SODIUM-DEXTROSE 2-4 GM/100ML-% IV SOLN
INTRAVENOUS | Status: AC
  Filled 2017-05-01: qty 100

## 2017-05-01 MED ORDER — HYDROMORPHONE HCL-NACL 0.5-0.9 MG/ML-% IV SOSY
PREFILLED_SYRINGE | INTRAVENOUS | Status: AC
  Filled 2017-05-01: qty 2

## 2017-05-01 MED ORDER — PHENYLEPHRINE HCL 10 MG/ML IJ SOLN
INTRAMUSCULAR | Status: DC | PRN
  Administered 2017-05-01 (×2): 80 ug via INTRAVENOUS

## 2017-05-01 MED ORDER — LACTATED RINGERS IV SOLN
INTRAVENOUS | Status: DC
  Administered 2017-05-01 (×3): via INTRAVENOUS

## 2017-05-01 MED ORDER — ONDANSETRON HCL 4 MG/2ML IJ SOLN
4.0000 mg | Freq: Four times a day (QID) | INTRAMUSCULAR | Status: DC | PRN
  Administered 2017-05-01: 4 mg via INTRAVENOUS
  Filled 2017-05-01: qty 2

## 2017-05-01 MED ORDER — MIDAZOLAM HCL 5 MG/5ML IJ SOLN
INTRAMUSCULAR | Status: DC | PRN
  Administered 2017-05-01: 2 mg via INTRAVENOUS

## 2017-05-01 MED ORDER — CEFAZOLIN SODIUM-DEXTROSE 2-4 GM/100ML-% IV SOLN
2.0000 g | INTRAVENOUS | Status: AC
  Administered 2017-05-01: 2 g via INTRAVENOUS

## 2017-05-01 MED ORDER — ACETAMINOPHEN 650 MG RE SUPP: 650.0000 mg | Freq: Four times a day (QID) | RECTAL | Status: DC | PRN

## 2017-05-01 MED ORDER — PHENOL 1.4 % MT LIQD: 1.0000 | OROMUCOSAL | Status: DC | PRN

## 2017-05-01 MED ORDER — PHENYLEPHRINE 40 MCG/ML (10ML) SYRINGE FOR IV PUSH (FOR BLOOD PRESSURE SUPPORT)
PREFILLED_SYRINGE | INTRAVENOUS | Status: AC
  Filled 2017-05-01: qty 10

## 2017-05-01 MED ORDER — TRANEXAMIC ACID 1000 MG/10ML IV SOLN
2000.0000 mg | Freq: Once | INTRAVENOUS | Status: DC
  Filled 2017-05-01: qty 20

## 2017-05-01 MED ORDER — DOCUSATE SODIUM 100 MG PO CAPS
100.0000 mg | ORAL_CAPSULE | Freq: Two times a day (BID) | ORAL | Status: DC
  Administered 2017-05-01 – 2017-05-03 (×3): 100 mg via ORAL
  Filled 2017-05-01 (×4): qty 1

## 2017-05-01 MED ORDER — METOCLOPRAMIDE HCL 5 MG/ML IJ SOLN
5.0000 mg | Freq: Three times a day (TID) | INTRAMUSCULAR | Status: DC | PRN
Start: 2017-05-01 — End: 2017-05-03
  Administered 2017-05-01 – 2017-05-02 (×2): 10 mg via INTRAVENOUS
  Filled 2017-05-01 (×2): qty 2

## 2017-05-01 MED ORDER — FENTANYL CITRATE (PF) 100 MCG/2ML IJ SOLN
INTRAMUSCULAR | Status: DC | PRN
  Administered 2017-05-01: 100 ug via INTRAVENOUS

## 2017-05-01 MED ORDER — ACETAMINOPHEN 500 MG PO TABS
1000.0000 mg | ORAL_TABLET | Freq: Four times a day (QID) | ORAL | Status: AC
  Administered 2017-05-01 – 2017-05-02 (×4): 1000 mg via ORAL
  Filled 2017-05-01 (×4): qty 2

## 2017-05-01 MED ORDER — ONDANSETRON HCL 4 MG/2ML IJ SOLN
INTRAMUSCULAR | Status: AC
  Filled 2017-05-01: qty 2

## 2017-05-01 MED ORDER — PROMETHAZINE HCL 25 MG/ML IJ SOLN: 6.2500 mg | INTRAMUSCULAR | Status: DC | PRN

## 2017-05-01 MED ORDER — METHOCARBAMOL 1000 MG/10ML IJ SOLN
500.0000 mg | Freq: Four times a day (QID) | INTRAVENOUS | Status: DC | PRN
  Administered 2017-05-01: 500 mg via INTRAVENOUS
  Filled 2017-05-01: qty 550

## 2017-05-01 MED ORDER — BUPIVACAINE IN DEXTROSE 0.75-8.25 % IT SOLN
INTRATHECAL | Status: DC | PRN
  Administered 2017-05-01: 1.8 mL via INTRATHECAL

## 2017-05-01 MED ORDER — WARFARIN SODIUM 5 MG PO TABS
7.5000 mg | ORAL_TABLET | Freq: Once | ORAL | Status: AC
  Administered 2017-05-01: 7.5 mg via ORAL
  Filled 2017-05-01: qty 1

## 2017-05-01 MED ORDER — MIDAZOLAM HCL 2 MG/2ML IJ SOLN
INTRAMUSCULAR | Status: AC
  Filled 2017-05-01: qty 2

## 2017-05-01 MED ORDER — ONDANSETRON HCL 4 MG PO TABS: 4.0000 mg | ORAL_TABLET | Freq: Four times a day (QID) | ORAL | Status: DC | PRN

## 2017-05-01 MED ORDER — DIPHENHYDRAMINE HCL 12.5 MG/5ML PO ELIX: 12.5000 mg | ORAL_SOLUTION | ORAL | Status: DC | PRN

## 2017-05-01 MED ORDER — METOCLOPRAMIDE HCL 5 MG PO TABS
5.0000 mg | ORAL_TABLET | Freq: Three times a day (TID) | ORAL | Status: DC | PRN
  Administered 2017-05-03: 05:00:00 5 mg via ORAL
  Filled 2017-05-01: qty 2

## 2017-05-01 MED ORDER — POLYETHYLENE GLYCOL 3350 17 G PO PACK
17.0000 g | PACK | Freq: Every day | ORAL | Status: DC | PRN
  Administered 2017-05-02 – 2017-05-03 (×2): 17 g via ORAL
  Filled 2017-05-01 (×2): qty 1

## 2017-05-01 MED ORDER — ONDANSETRON HCL 4 MG/2ML IJ SOLN
INTRAMUSCULAR | Status: DC | PRN
  Administered 2017-05-01: 4 mg via INTRAVENOUS

## 2017-05-01 MED ORDER — PROPOFOL 10 MG/ML IV BOLUS
INTRAVENOUS | Status: AC
  Filled 2017-05-01: qty 20

## 2017-05-01 MED ORDER — OXYCODONE HCL 5 MG PO TABS
5.0000 mg | ORAL_TABLET | ORAL | Status: DC | PRN
  Administered 2017-05-01 – 2017-05-03 (×11): 10 mg via ORAL
  Filled 2017-05-01 (×11): qty 2

## 2017-05-01 MED ORDER — PROPOFOL 10 MG/ML IV BOLUS
INTRAVENOUS | Status: AC
  Filled 2017-05-01: qty 40

## 2017-05-01 MED ORDER — ACETAMINOPHEN 10 MG/ML IV SOLN
INTRAVENOUS | Status: AC
  Filled 2017-05-01: qty 100

## 2017-05-01 MED ORDER — NITROGLYCERIN 0.4 MG SL SUBL: 0.4000 mg | SUBLINGUAL_TABLET | SUBLINGUAL | Status: DC | PRN

## 2017-05-01 MED ORDER — METHOCARBAMOL 500 MG PO TABS
500.0000 mg | ORAL_TABLET | Freq: Four times a day (QID) | ORAL | Status: DC | PRN
  Administered 2017-05-01 – 2017-05-03 (×2): 500 mg via ORAL
  Filled 2017-05-01 (×3): qty 1

## 2017-05-01 MED ORDER — SODIUM CHLORIDE 0.9 % IV SOLN
INTRAVENOUS | Status: DC
  Administered 2017-05-01: 12:00:00 via INTRAVENOUS

## 2017-05-01 MED ORDER — DEXAMETHASONE SODIUM PHOSPHATE 10 MG/ML IJ SOLN
INTRAMUSCULAR | Status: AC
  Filled 2017-05-01: qty 1

## 2017-05-01 MED ORDER — HYDROMORPHONE HCL-NACL 0.5-0.9 MG/ML-% IV SOSY
0.2500 mg | PREFILLED_SYRINGE | INTRAVENOUS | Status: DC | PRN
  Administered 2017-05-01 (×2): 0.5 mg via INTRAVENOUS

## 2017-05-01 MED ORDER — FENTANYL CITRATE (PF) 100 MCG/2ML IJ SOLN
INTRAMUSCULAR | Status: AC
  Filled 2017-05-01: qty 2

## 2017-05-01 MED ORDER — MORPHINE SULFATE (PF) 4 MG/ML IV SOLN
1.0000 mg | INTRAVENOUS | Status: DC | PRN
  Administered 2017-05-01 – 2017-05-02 (×2): 1 mg via INTRAVENOUS
  Filled 2017-05-01 (×2): qty 1

## 2017-05-01 MED ORDER — BUPIVACAINE HCL (PF) 0.25 % IJ SOLN
INTRAMUSCULAR | Status: DC | PRN
  Administered 2017-05-01: 30 mL

## 2017-05-01 MED ORDER — PANTOPRAZOLE SODIUM 40 MG PO TBEC
40.0000 mg | DELAYED_RELEASE_TABLET | Freq: Every day | ORAL | Status: DC
  Administered 2017-05-02 – 2017-05-03 (×2): 40 mg via ORAL
  Filled 2017-05-01 (×2): qty 1

## 2017-05-01 MED ORDER — ACETAMINOPHEN 10 MG/ML IV SOLN
1000.0000 mg | Freq: Once | INTRAVENOUS | Status: AC
  Administered 2017-05-01: 1000 mg via INTRAVENOUS

## 2017-05-01 MED ORDER — BUPIVACAINE HCL (PF) 0.25 % IJ SOLN
INTRAMUSCULAR | Status: AC
  Filled 2017-05-01: qty 30

## 2017-05-01 MED ORDER — DEXAMETHASONE SODIUM PHOSPHATE 10 MG/ML IJ SOLN
10.0000 mg | Freq: Once | INTRAMUSCULAR | Status: AC
  Administered 2017-05-01: 10 mg via INTRAVENOUS

## 2017-05-01 MED ORDER — FLEET ENEMA 7-19 GM/118ML RE ENEM: 1.0000 | ENEMA | Freq: Once | RECTAL | Status: DC | PRN

## 2017-05-01 MED ORDER — DEXAMETHASONE SODIUM PHOSPHATE 10 MG/ML IJ SOLN
10.0000 mg | Freq: Once | INTRAMUSCULAR | Status: AC
  Administered 2017-05-02: 09:00:00 10 mg via INTRAVENOUS
  Filled 2017-05-01: qty 1

## 2017-05-01 MED ORDER — PROPOFOL 500 MG/50ML IV EMUL
INTRAVENOUS | Status: DC | PRN
  Administered 2017-05-01: 75 ug/kg/min via INTRAVENOUS

## 2017-05-01 MED ORDER — CEFAZOLIN SODIUM-DEXTROSE 2-4 GM/100ML-% IV SOLN
2.0000 g | Freq: Four times a day (QID) | INTRAVENOUS | Status: AC
  Administered 2017-05-01 (×2): 2 g via INTRAVENOUS
  Filled 2017-05-01 (×2): qty 100

## 2017-05-01 MED ORDER — WARFARIN - PHARMACIST DOSING INPATIENT: Freq: Every day | Status: DC

## 2017-05-01 SURGICAL SUPPLY — 37 items
BAG DECANTER FOR FLEXI CONT (MISCELLANEOUS) ×3 IMPLANT
BAG SPEC THK2 15X12 ZIP CLS (MISCELLANEOUS)
BAG ZIPLOCK 12X15 (MISCELLANEOUS) IMPLANT
BLADE SAG 18X100X1.27 (BLADE) ×3 IMPLANT
CAPT HIP TOTAL 2 ×2 IMPLANT
CLOSURE WOUND 1/2 X4 (GAUZE/BANDAGES/DRESSINGS) ×1
CLOTH BEACON ORANGE TIMEOUT ST (SAFETY) ×3 IMPLANT
COVER PERINEAL POST (MISCELLANEOUS) ×3 IMPLANT
COVER SURGICAL LIGHT HANDLE (MISCELLANEOUS) ×3 IMPLANT
DECANTER SPIKE VIAL GLASS SM (MISCELLANEOUS) ×3 IMPLANT
DRAPE STERI IOBAN 125X83 (DRAPES) ×3 IMPLANT
DRAPE U-SHAPE 47X51 STRL (DRAPES) ×6 IMPLANT
DRSG ADAPTIC 3X8 NADH LF (GAUZE/BANDAGES/DRESSINGS) ×3 IMPLANT
DRSG MEPILEX BORDER 4X4 (GAUZE/BANDAGES/DRESSINGS) ×3 IMPLANT
DRSG MEPILEX BORDER 4X8 (GAUZE/BANDAGES/DRESSINGS) ×3 IMPLANT
DURAPREP 26ML APPLICATOR (WOUND CARE) ×3 IMPLANT
ELECT REM PT RETURN 15FT ADLT (MISCELLANEOUS) ×3 IMPLANT
EVACUATOR 1/8 PVC DRAIN (DRAIN) ×3 IMPLANT
GLOVE BIO SURGEON STRL SZ7.5 (GLOVE) ×3 IMPLANT
GLOVE BIO SURGEON STRL SZ8 (GLOVE) ×6 IMPLANT
GLOVE BIOGEL PI IND STRL 8 (GLOVE) ×2 IMPLANT
GLOVE BIOGEL PI INDICATOR 8 (GLOVE) ×4
GOWN STRL REUS W/TWL LRG LVL3 (GOWN DISPOSABLE) ×3 IMPLANT
GOWN STRL REUS W/TWL XL LVL3 (GOWN DISPOSABLE) ×3 IMPLANT
NS IRRIG 1000ML POUR BTL (IV SOLUTION) ×3 IMPLANT
PACK ANTERIOR HIP CUSTOM (KITS) ×3 IMPLANT
STRIP CLOSURE SKIN 1/2X4 (GAUZE/BANDAGES/DRESSINGS) ×2 IMPLANT
SUT ETHIBOND NAB CT1 #1 30IN (SUTURE) ×3 IMPLANT
SUT MNCRL AB 4-0 PS2 18 (SUTURE) ×3 IMPLANT
SUT STRATAFIX 0 PDS 27 VIOLET (SUTURE) ×3
SUT VIC AB 2-0 CT1 27 (SUTURE) ×6
SUT VIC AB 2-0 CT1 TAPERPNT 27 (SUTURE) ×2 IMPLANT
SUTURE STRATFX 0 PDS 27 VIOLET (SUTURE) ×1 IMPLANT
SYR 50ML LL SCALE MARK (SYRINGE) IMPLANT
TRAY FOLEY W/METER SILVER 16FR (SET/KITS/TRAYS/PACK) ×3 IMPLANT
WATER STERILE IRR 1000ML POUR (IV SOLUTION) ×4 IMPLANT
YANKAUER SUCT BULB TIP 10FT TU (MISCELLANEOUS) ×3 IMPLANT

## 2017-05-01 NOTE — Progress Notes (Signed)
ANTICOAGULATION CONSULT NOTE   Pharmacy Consult for Warfarin Indication: hx DVT, VTE prophylaxis s/p THA  No Known Allergies  Patient Measurements: Height: 5\' 8"  (172.7 cm) Weight: 211 lb (95.7 kg) IBW/kg (Calculated) : 68.4  Vital Signs: Temp: 97.5 F (36.4 C) (08/01 1130) Temp Source: Oral (08/01 0617) BP: 135/71 (08/01 1130) Pulse Rate: 52 (08/01 1130)  Labs:  Recent Labs  05/01/17 0630  LABPROT 13.3  INR 1.01   Estimated Creatinine Clearance: 88.1 mL/min (by C-G formula based on SCr of 0.77 mg/dL).  Medical History: Past Medical History:  Diagnosis Date  . Arthritis   . Chest pain, unspecified   . DVT (deep venous thrombosis) (HCC)   . GERD (gastroesophageal reflux disease)   . Heart murmur    hx of   . History of hiatal hernia   . HTN (hypertension)   . Lupus   . Lupus   . Shingles    hx of   . Stroke [facility](HCC) 2010   'MILD" per pt-  weakness left side  . Tinnitus   . Ulcer of abdomen wall (HCC)    Medications:  Scheduled:  . acetaminophen  1,000 mg Oral Q6H  . [START ON 05/02/2017] dexamethasone  10 mg Intravenous Once  . docusate sodium  100 mg Oral BID  . HYDROmorphone      . [START ON 05/02/2017] pantoprazole  40 mg Oral Daily   Assessment: 75 yoM s/p R THA, anterior approach. Hx of DVT on chronic Warfarin 5mg  daily, LD 7/26. Admit INT = 1.01  Resume Warfarin per Rx  Goal of Therapy:  INR 2-3 Monitor platelets by anticoagulation protocol: Yes   Plan:   Daily Protime/INR  Warfarin 7.5mg  today at 1800  Otho BellowsGreen, Valery Chance L PharmD Pager [phone] 05/01/2017, 11:58 AM

## 2017-05-01 NOTE — Anesthesia Postprocedure Evaluation (Signed)
Anesthesia Post Note  Patient: Jose Bush  Procedure(s) Performed: Procedure(s) (LRB): RIGHT TOTAL HIP ARTHROPLASTY ANTERIOR APPROACH (Right)     Patient location during evaluation: PACU Anesthesia Type: Spinal Level of consciousness: oriented and awake and alert Pain management: pain level controlled Vital Signs Assessment: post-procedure vital signs reviewed and stable Respiratory status: spontaneous breathing, respiratory function stable and patient connected to nasal cannula oxygen Cardiovascular status: blood pressure returned to baseline and stable Postop Assessment: no headache and no backache Anesthetic complications: no    Last Vitals:  Vitals:   05/01/17 1330 05/01/17 1444  BP: (!) 143/66 (!) 141/70  Pulse: (!) 53 (!) 54  Resp: 16 16  Temp: (!) 36.4 C (!) 36.3 C    Last Pain:  Vitals:   05/01/17 1536  TempSrc:   PainSc: 7                  Daryana Whirley S

## 2017-05-01 NOTE — Op Note (Signed)
OPERATIVE REPORT- TOTAL HIP ARTHROPLASTY   PREOPERATIVE DIAGNOSIS: Osteoarthritis of the Right hip.   POSTOPERATIVE DIAGNOSIS: Osteoarthritis of the Right  hip.   PROCEDURE: Right total hip arthroplasty, anterior approach.   SURGEON: Ollen GrossFrank Dinisha Cai, MD   ASSISTANT: Diana EvesJustin Festa PA-S  ANESTHESIA:  Spinal  ESTIMATED BLOOD LOSS:-625 ml   DRAINS: Hemovac x1.   COMPLICATIONS: None   CONDITION: PACU - hemodynamically stable.   BRIEF CLINICAL NOTE: Jose Bush is a 76 y.o. male who has advanced end-  stage arthritis of their Right  hip with progressively worsening pain and  dysfunction.The patient has failed nonoperative management and presents for  total hip arthroplasty.   PROCEDURE IN DETAIL: After successful administration of spinal  anesthetic, the traction boots [facility] were placed on both  feet and the [facility] was [facility], boots placed into the leg  holders. The Right hip was then isolated from the perineum with plastic  drapes and prepped and draped in the usual sterile fashion. ASIS and  greater trochanter were marked and a oblique incision was made, starting  at about 1 cm lateral and 2 cm distal to the ASIS and coursing towards  the anterior cortex of the femur. The skin was cut with a 10 blade  through subcutaneous tissue to the level of the fascia overlying the  tensor fascia lata muscle. The fascia was then incised in line with the  incision at the junction of the anterior third and posterior 2/3rd. The  muscle was teased off the fascia and then the interval between the TFL  and the rectus was developed. The Hohmann retractor was then placed at  the top of the femoral neck over the capsule. The vessels overlying the  capsule were cauterized and the fat on top of the capsule was removed.  A Hohmann retractor was then placed anterior underneath the rectus  femoris to give exposure to the entire anterior capsule. A T-shaped   capsulotomy was performed. The edges were tagged and the femoral head  was identified.       Osteophytes are removed off the superior acetabulum.  The femoral neck was then cut in situ with an oscillating saw. Traction  was then applied to the left lower extremity utilizing the [facility]  traction. The femoral head was then removed. Retractors were placed  around the acetabulum and then circumferential removal of the labrum was  performed. Osteophytes were also removed. Reaming starts at 47 mm to  medialize and  Increased in 2 mm increments to 51 mm. We reamed in  approximately 40 degrees of abduction, 20 degrees anteversion. A 52 mm  pinnacle acetabular shell was then impacted in anatomic position under  fluoroscopic guidance with excellent purchase. We did not need to place  any additional dome screws. A 32 mm neutral + 4 marathon liner was then  placed into the acetabular shell.       The femoral lift was then placed along the lateral aspect of the femur  just distal to the vastus ridge. The leg was  externally rotated and capsule  was stripped off the inferior aspect of the femoral neck down to the  level of the lesser trochanter, this was done with electrocautery. The femur was lifted after this was performed. The  leg was then placed in an extended and adducted position essentially delivering the femur. We also removed the capsule superiorly and the piriformis from the piriformis fossa  to gain excellent exposure of the  proximal femur. Rongeur was used to remove some cancellous bone to get  into the lateral portion of the proximal femur for placement of the  initial starter reamer. The starter broaches was placed  the starter broach  and was shown to go down the center of the canal. Broaching  with the  Corail system was then performed starting at size 8, coursing  Up to size 12. A size 12 had excellent torsional and rotational  and axial stability. The trial high offset neck was then  placed  with a 32 + 1 trial head. The hip was then reduced. We confirmed that  the stem was in the canal both on AP and lateral x-rays. It also has excellent sizing. The hip was reduced with outstanding stability through full extension and full external rotation.. AP pelvis was taken and the leg lengths were measured and found to be equal. Hip was then dislocated again and the femoral head and neck removed. The  femoral broach was removed. Size 12 Corail stem with a high offset  neck was then impacted into the femur following native anteversion. Has  excellent purchase in the canal. Excellent torsional and rotational and  axial stability. It is confirmed to be in the canal on AP and lateral  fluoroscopic views. The 32 + 1 ceramic head was placed and the hip  reduced with outstanding stability. Again AP pelvis was taken and it  confirmed that the leg lengths were equal. The wound was then copiously  irrigated with saline solution and the capsule reattached and repaired  with Ethibond suture. 30 ml of .25% Bupivicaine was  injected into the capsule and into the edge of the tensor fascia lata as well as subcutaneous tissue. The fascia overlying the tensor fascia lata was then closed with a running #1 V-Loc. Subcu was closed with interrupted 2-0 Vicryl and subcuticular running 4-0 Monocryl. Incision was cleaned  and dried. Steri-Strips and a bulky sterile dressing applied. Hemovac  drain was hooked to suction and then the patient was awakened and transported to  recovery in stable condition.        Please note that a surgical assistant was a medical necessity for this procedure to perform it in a safe and expeditious manner. Assistant was necessary to provide appropriate retraction of vital neurovascular structures and to prevent femoral fracture and allow for anatomic placement of the prosthesis.  [name], M.D.

## 2017-05-01 NOTE — Evaluation (Signed)
Physical Therapy Evaluation Patient Details Name: Jose Bush MRN: [id number] DOB: Jul 05, 1941 Today's Date: 05/01/2017   History of Present Illness  Pt s/p R THR and with hx of Bil TKR, Lupus and CVA  Clinical Impression  Pt s/p R THR and presents with decreased R LE strength/ROM and post op pain and nausea limiting functional mobility.  Pt should progress to dc home with family assist.    Follow Up Recommendations Home health PT;DC plan and follow up therapy as arranged by surgeon    Equipment Recommendations  None recommended by PT    Recommendations for Other Services       Precautions / Restrictions Precautions Precautions: Fall Restrictions Weight Bearing Restrictions: No Other Position/Activity Restrictions: WBAT      Mobility  Bed Mobility Overal bed mobility: Needs Assistance Bed Mobility: Supine to Sit     Supine to sit: Mod assist     General bed mobility comments: Increased time with cues for sequence and use of L LE to self assist.  Physical assist to manage R LE and to bring trunk to upright  Transfers Overall transfer level: Needs assistance Equipment used: Rolling walker (2 wheeled) Transfers: Sit to/from Stand Sit to Stand: Min assist;Mod assist;From elevated surface         General transfer comment: cues for LE management and use of UEs to self assist  Ambulation/Gait Ambulation/Gait assistance: Min assist Ambulation Distance (Feet): 3 Feet Assistive device: Rolling walker (2 wheeled) Gait Pattern/deviations: Step-to pattern;Decreased step length - right;Decreased step length - left;Shuffle;Trunk flexed Gait velocity: decr Gait velocity interpretation: Below normal speed for age/gender General Gait Details: cues for sequence, posture and position from [employer]  Stairs            Wheelchair Mobility    Modified Rankin (Stroke Patients Only)       Balance                                             Pertinent  Vitals/Pain Pain Assessment: 0-10 Pain Score: 4  Pain Location: R hip Pain Descriptors / Indicators: Aching;Sore Pain Intervention(s): Limited activity within patient's tolerance;Monitored during session;Premedicated before session;Ice applied    Home Living Family/patient expects to be discharged to:: Private residence Living Arrangements: Spouse/significant other Available Help at Discharge: Family Type of Home: House Home Access: Stairs to enter Entrance Stairs-Rails: Right Entrance Stairs-Number of Steps: 2 Home Layout: One level Home Equipment: Environmental consultantWalker - 2 wheels;Cane - single point;Shower seat;Bedside commode      Prior Function Level of Independence: Independent               Hand Dominance        Extremity/Trunk Assessment   Upper Extremity Assessment Upper Extremity Assessment: Overall WFL for tasks assessed    Lower Extremity Assessment Lower Extremity Assessment: RLE deficits/detail       Communication   Communication: No difficulties  Cognition Arousal/Alertness: Awake/alert Behavior During Therapy: WFL for tasks assessed/performed Overall Cognitive Status: Within Functional Limits for tasks assessed                                        General Comments      Exercises Total Joint Exercises Ankle Circles/Pumps: AROM;Both;15 reps;Supine   Assessment/Plan    PT  Assessment Patient needs continued PT services  PT Problem List Decreased strength;Decreased range of motion;Decreased activity tolerance;Decreased balance;Decreased mobility;Decreased knowledge of use of DME;Pain       PT Treatment Interventions DME instruction;Gait training;Stair training;Functional mobility training;Therapeutic activities;Therapeutic exercise;Patient/family education    PT Goals (Current goals can be found in the Care Plan section)  Acute Rehab PT Goals Patient Stated Goal: Regain IND and walk without pain PT Goal Formulation: With patient Time  For Goal Achievement: 05/05/17 Potential to Achieve Goals: Good    Frequency 7X/week   Barriers to discharge        Co-evaluation               AM-PAC PT "6 Clicks" Daily Activity  Outcome Measure Difficulty turning over in bed (including adjusting bedclothes, sheets and blankets)?: Total Difficulty moving from lying on back to sitting on the side of the bed? : Total Difficulty sitting down on and standing up from a chair with arms (e.g., wheelchair, bedside commode, etc,.)?: Total Help needed moving to and from a bed to chair (including a wheelchair)?: A Lot Help needed walking in hospital room?: A Lot Help needed climbing 3-5 steps with a railing? : A Lot 6 Click Score: 9    End of Session Equipment Utilized During Treatment: Gait belt Activity Tolerance: Patient limited by fatigue;Other (comment) (nausea) Patient left: in chair;with call bell/phone within reach;with nursing/sitter in room;with family/visitor present Nurse Communication: Mobility status PT Visit Diagnosis: Difficulty in walking, not elsewhere classified (R26.2)    Time: [id number] PT Time Calculation (min) (ACUTE ONLY): 25 min   Charges:   PT Evaluation $PT Eval Low Complexity: 1 Low PT Treatments $Therapeutic Activity: 8-22 mins   PT G Codes:        Pg [phone]   Hermelinda Diegel 05/01/2017, 5:20 PM

## 2017-05-01 NOTE — Anesthesia Procedure Notes (Signed)
Spinal  Patient location during procedure: OR Start time: 05/01/2017 8:31 AM End time: 05/01/2017 8:31 AM Staffing Anesthesiologist: Marquice Uddin, Iona Beard Performed: anesthesiologist  Preanesthetic Checklist Completed: patient identified, site marked, surgical consent, pre-op evaluation, timeout performed, IV checked, risks and benefits discussed and monitors and equipment checked Spinal Block Patient position: sitting Prep: Betadine Patient monitoring: heart rate, continuous pulse ox and blood pressure Injection technique: single-shot Needle Needle type: Sprotte  Needle gauge: 24 G Needle length: 9 cm Additional Notes Expiration date of kit checked and confirmed. Patient tolerated procedure well, without complications.

## 2017-05-01 NOTE — Anesthesia Preprocedure Evaluation (Signed)
Anesthesia Evaluation  Patient identified by MRN, date of birth, ID band Patient awake    Reviewed: Allergy & Precautions, NPO status , Patient's Chart, lab work & pertinent test results  Airway Mallampati: II  TM Distance: >3 FB Neck ROM: Full    Dental no notable dental hx.    Pulmonary neg pulmonary ROS, former smoker,    Pulmonary exam normal breath sounds clear to auscultation       Cardiovascular hypertension, + DVT  Normal cardiovascular exam Rhythm:Regular Rate:Normal     Neuro/Psych negative neurological ROS  negative psych ROS   GI/Hepatic Neg liver ROS, GERD  ,  Endo/Other  negative endocrine ROS  Renal/GU negative Renal ROS  negative genitourinary   Musculoskeletal negative musculoskeletal ROS (+)   Abdominal   Peds negative pediatric ROS (+)  Hematology negative hematology ROS (+)   Anesthesia Other Findings   Reproductive/Obstetrics negative OB ROS                             Anesthesia Physical Anesthesia Plan  ASA: III  Anesthesia Plan: Spinal   Post-op Pain Management:    Induction: Intravenous  PONV Risk Score and Plan: 1 and Ondansetron and Dexamethasone  Airway Management Planned: Simple Face Mask  Additional Equipment:   Intra-op Plan:   Post-operative Plan:   Informed Consent: I have reviewed the patients History and Physical, chart, labs and discussed the procedure including the risks, benefits and alternatives for the proposed anesthesia with the patient or authorized representative who has indicated his/her understanding and acceptance.   Dental advisory given  Plan Discussed with: CRNA and Surgeon  Anesthesia Plan Comments:         Anesthesia Quick Evaluation

## 2017-05-01 NOTE — Transfer of Care (Signed)
Immediate Anesthesia Transfer of Care Note  Patient: Jose Bush  Procedure(s) Performed: Procedure(s): RIGHT TOTAL HIP ARTHROPLASTY ANTERIOR APPROACH (Right)  Patient Location: PACU  Anesthesia Type:Spinal  Level of Consciousness: awake, alert  and oriented  Airway & Oxygen Therapy: Patient Spontanous Breathing and Patient connected to face mask oxygen  Post-op Assessment: Report given to RN and Post -op Vital signs reviewed and stable  Post vital signs: Reviewed and stable  Last Vitals:  Vitals:   05/01/17 0617  BP: 126/68  Pulse: 60  Resp: 18  Temp: 36.8 C    Last Pain:  Vitals:   05/01/17 0617  TempSrc: Oral         Complications: No apparent anesthesia complications

## 2017-05-02 LAB — BASIC METABOLIC PANEL
ANION GAP: 6 (ref 5–15)
BUN: 19 mg/dL (ref 6–20)
CHLORIDE: 106 mmol/L (ref 101–111)
CO2: 25 mmol/L (ref 22–32)
Calcium: 8.6 mg/dL — ABNORMAL LOW (ref 8.9–10.3)
Creatinine, Ser: 0.8 mg/dL (ref 0.61–1.24)
GFR calc non Af Amer: >60 mL/min (ref >60)
Glucose, Bld: 157 mg/dL — ABNORMAL HIGH (ref 65–99)
POTASSIUM: 4.1 mmol/L (ref 3.5–5.1)
SODIUM: 137 mmol/L (ref 135–145)

## 2017-05-02 LAB — PROTIME-INR
INR: 1.12
PROTHROMBIN TIME: 14.4 s (ref 11.4–15.2)

## 2017-05-02 LAB — CBC
HEMATOCRIT: 30.5 % — AB (ref 39.0–52.0)
HEMOGLOBIN: 10.3 g/dL — AB (ref 13.0–17.0)
MCH: 29.2 pg (ref 26.0–34.0)
MCHC: 33.8 g/dL (ref 30.0–36.0)
MCV: 86.4 fL (ref 78.0–100.0)
Platelets: 145 10*3/uL — ABNORMAL LOW (ref 150–400)
RBC: 3.53 MIL/uL — ABNORMAL LOW (ref 4.22–5.81)
RDW: 15.3 % (ref 11.5–15.5)
WBC: 10 10*3/uL (ref 4.0–10.5)

## 2017-05-02 MED ORDER — WARFARIN SODIUM 5 MG PO TABS
7.5000 mg | ORAL_TABLET | Freq: Once | ORAL | Status: AC
  Administered 2017-05-02: 17:00:00 7.5 mg via ORAL
  Filled 2017-05-02: qty 1

## 2017-05-02 MED ORDER — ENOXAPARIN SODIUM 30 MG/0.3ML ~~LOC~~ SOLN
30.0000 mg | Freq: Two times a day (BID) | SUBCUTANEOUS | Status: DC
  Administered 2017-05-02 – 2017-05-03 (×3): 30 mg via SUBCUTANEOUS
  Filled 2017-05-02 (×3): qty 0.3

## 2017-05-02 NOTE — Progress Notes (Signed)
Physical Therapy Treatment Patient Details Name: Jose [name] MRN: [id number] DOB: Jul 27, 1941 Today's Date: 05/02/2017    History of Present Illness Pt s/p R THR and with hx of Bil TKR, Lupus and CVA    PT Comments    POD # 1 pm session Pt amb only 15 feet from bathroom back to bed this afternoon due to fatigue attempting to void (unsuccessful).  Assisted back to bed and applied ICE.   Follow Up Recommendations  Home health PT;DC plan and follow up therapy as arranged by surgeon     Equipment Recommendations  None recommended by PT    Recommendations for Other Services       Precautions / Restrictions Precautions Precautions: Fall Restrictions Weight Bearing Restrictions: No Other Position/Activity Restrictions: WBAT    Mobility  Bed Mobility Overal bed mobility: Needs Assistance Bed Mobility: Sit to Supine     Supine to sit: Mod assist Sit to supine: Mod assist   General bed mobility comments: assisted back to bed supporting B LE's  Transfers Overall transfer level: Needs assistance Equipment used: Rolling walker (2 wheeled) Transfers: Sit to/from Stand Sit to Stand: Min assist;Mod assist;From elevated surface         General transfer comment: cues for LE management and use of UEs to self assist  Ambulation/Gait Ambulation/Gait assistance: Min assist Ambulation Distance (Feet): 12 Feet Assistive device: Rolling walker (2 wheeled) Gait Pattern/deviations: Step-to pattern;Decreased step length - right;Decreased step length - left;Shuffle;Trunk flexed Gait velocity: decr   General Gait Details: amb from bathroom only due to fatigue   Stairs            Wheelchair Mobility    Modified Rankin (Stroke Patients Only)       Balance                                            Cognition Arousal/Alertness: Awake/alert Behavior During Therapy: WFL for tasks assessed/performed Overall Cognitive Status: Within Functional Limits  for tasks assessed                                        Exercises      General Comments        Pertinent Vitals/Pain Pain Assessment: 0-10 Pain Score: 3  Pain Location: R hip Pain Descriptors / Indicators: Aching;Sore;Operative site guarding Pain Intervention(s): Premedicated before session;Repositioned;Monitored during session;Ice applied    Home Living                      Prior Function            PT Goals (current goals can now be found in the care plan section) Progress towards PT goals: Progressing toward goals    Frequency    7X/week      PT Plan Current plan remains appropriate    Co-evaluation              AM-PAC PT "6 Clicks" Daily Activity  Outcome Measure  Difficulty turning over in bed (including adjusting bedclothes, sheets and blankets)?: Total Difficulty moving from lying on back to sitting on the side of the bed? : Total Difficulty sitting down on and standing up from a chair with arms (e.g., wheelchair, bedside commode, etc,.)?: Total Help needed moving to  and from a bed to chair (including a wheelchair)?: A Lot Help needed walking in hospital room?: A Lot Help needed climbing 3-5 steps with a railing? : A Lot 6 Click Score: 9    End of Session Equipment Utilized During Treatment: Gait belt Activity Tolerance: Patient tolerated treatment well Patient left: with call bell/phone within reach;with family/visitor present;in chair;with chair alarm set Nurse Communication: Mobility status PT Visit Diagnosis: Difficulty in walking, not elsewhere classified (R26.2)     Time: 1417-1430 PT Time Calculation (min) (ACUTE ONLY): 13 min  Charges:  $Gait Training: 8-22 mins                    G Codes:       {Ermagene Saidi  PTA [facility]  Acute  Rehab Pager      [phone]

## 2017-05-02 NOTE — Progress Notes (Signed)
Physical Therapy Treatment Patient Details Name: Jose Bush MRN: [id number] DOB: 1940-10-11 Today's Date: 05/02/2017    History of Present Illness Pt s/p R THR and with hx of Bil TKR, Lupus and CVA    PT Comments    POD # 1 am session Assisted OOB to amb a greater distance then performed some THR TE's followed by ICE.     Follow Up Recommendations  Home health PT;DC plan and follow up therapy as arranged by surgeon     Equipment Recommendations  None recommended by PT    Recommendations for Other Services       Precautions / Restrictions Precautions Precautions: Fall Restrictions Weight Bearing Restrictions: No Other Position/Activity Restrictions: WBAT    Mobility  Bed Mobility Overal bed mobility: Needs Assistance Bed Mobility: Supine to Sit     Supine to sit: Mod assist        Transfers Overall transfer level: Needs assistance Equipment used: Rolling walker (2 wheeled) Transfers: Sit to/from Stand Sit to Stand: Min assist;Mod assist;From elevated surface         General transfer comment: cues for LE management and use of UEs to self assist  Ambulation/Gait Ambulation/Gait assistance: Min assist Ambulation Distance (Feet): 38 Feet Assistive device: Rolling walker (2 wheeled) Gait Pattern/deviations: Step-to pattern;Decreased step length - right;Decreased step length - left;Shuffle;Trunk flexed Gait velocity: decr   General Gait Details: cues for sequence, posture and position from [employer]   Stairs            Wheelchair Mobility    Modified Rankin (Stroke Patients Only)       Balance                                            Cognition Arousal/Alertness: Awake/alert Behavior During Therapy: WFL for tasks assessed/performed Overall Cognitive Status: Within Functional Limits for tasks assessed                                        Exercises   Total Hip Replacement TE's 10 reps ankle pumps 10  reps knee presses 10 reps heel slides 10 reps SAQ's 10 reps ABD Followed by ICE     General Comments        Pertinent Vitals/Pain Pain Assessment: 0-10 Pain Score: 3  Pain Location: R hip Pain Descriptors / Indicators: Aching;Sore;Operative site guarding Pain Intervention(s): Premedicated before session;Repositioned;Monitored during session;Ice applied    Home Living                      Prior Function            PT Goals (current goals can now be found in the care plan section) Progress towards PT goals: Progressing toward goals    Frequency    7X/week      PT Plan Current plan remains appropriate    Co-evaluation              AM-PAC PT "6 Clicks" Daily Activity  Outcome Measure  Difficulty turning over in bed (including adjusting bedclothes, sheets and blankets)?: Total Difficulty moving from lying on back to sitting on the side of the bed? : Total Difficulty sitting down on and standing up from a chair with arms (e.g., wheelchair, bedside commode, etc,.)?:  Total Help needed moving to and from a bed to chair (including a wheelchair)?: A Lot Help needed walking in hospital room?: A Lot Help needed climbing 3-5 steps with a railing? : A Lot 6 Click Score: 9    End of Session Equipment Utilized During Treatment: Gait belt Activity Tolerance: Patient tolerated treatment well Patient left: with call bell/phone within reach;with family/visitor present;in chair;with chair alarm set Nurse Communication: Mobility status PT Visit Diagnosis: Difficulty in walking, not elsewhere classified (R26.2)     Time: 0950-1020 PT Time Calculation (min) (ACUTE ONLY): 30 min  Charges:  $Gait Training: 8-22 mins $Therapeutic Exercise: 8-22 mins                    G Codes:       Felecia ShellingLori Benjimin Hadden  PTA [facility]  Acute  Rehab Pager      [phone]

## 2017-05-02 NOTE — Progress Notes (Signed)
Discharge planning, spoke with patient and spouse at bedside. Have chosen [facility] for HH PT. Contacted [facility] for referral. Has RW and 3n1. [phone] 

## 2017-05-02 NOTE — Progress Notes (Signed)
   Subjective: 1 Day Post-Op Procedure(s) (LRB): RIGHT TOTAL HIP ARTHROPLASTY ANTERIOR APPROACH (Right) Patient reports pain as mild.   Patient seen in rounds by Dr. Lequita HaltAluisio. Patient is well, but has had some minor complaints of pain in the hip, requiring pain medications We will resume therapy today. He took a few steps yesterday in the room. Plan is to go Home after hospital stay.  Objective: Vital signs in last 24 hours: Temp:  [97.3 F (36.3 C)-98.7 F (37.1 C)] 98.4 F (36.9 C) (08/02 0557) Pulse Rate:  [50-70] 60 (08/02 0557) Resp:  [13-17] 16 (08/02 0557) BP: (92-158)/(55-77) 114/55 (08/02 0557) SpO2:  [93 %-100 %] 98 % (08/02 0557)  Intake/Output from previous day:  Intake/Output Summary (Last 24 hours) at 05/02/17 0805 Last data filed at 05/02/17 0600  Gross per 24 hour  Intake             4545 ml  Output             2345 ml  Net             2200 ml    Intake/Output this shift: No intake/output data recorded.  Labs:  Recent Labs  05/02/17 0540  HGB 10.3*    Recent Labs  05/02/17 0540  WBC 10.0  RBC 3.53*  HCT 30.5*  PLT 145*    Recent Labs  05/02/17 0540  NA 137  K 4.1  CL 106  CO2 25  BUN 19  CREATININE 0.80  GLUCOSE 157*  CALCIUM 8.6*    Recent Labs  05/01/17 0630 05/02/17 0540  INR 1.01 1.12    EXAM General - Patient is Alert, Appropriate and Oriented Extremity - Neurovascular intact Sensation intact distally Dressing - dressing C/D/I Motor Function - intact, moving foot and toes well on exam.   Hemovac pulled without difficulty.  Past Medical History:  Diagnosis Date  . Arthritis   . Chest pain, unspecified   . DVT (deep venous thrombosis) (HCC)   . GERD (gastroesophageal reflux disease)   . Heart murmur    hx of   . History of hiatal hernia   . HTN (hypertension)   . Lupus   . Lupus   . Shingles    hx of   . Stroke [facility]) 2010   'MILD" per pt-  weakness left side  . Tinnitus   . Ulcer of abdomen wall (HCC)       Assessment/Plan: 1 Day Post-Op Procedure(s) (LRB): RIGHT TOTAL HIP ARTHROPLASTY ANTERIOR APPROACH (Right) Active Problems:   OA (osteoarthritis) of hip  Estimated body mass index is 32.08 kg/m as calculated from the following:   Height as of this encounter: 5\' 8"  (1.727 m).   Weight as of this encounter: 95.7 kg (211 lb). Advance diet Up with therapy Plan for discharge tomorrow Discharge home with home health  DVT Prophylaxis - Lovenox and Coumadin Weight Bearing As Tolerated right Leg Hemovac Pulled Begin Therapy  Avel Peacerew Perkins, PA-C Orthopaedic Surgery 05/02/2017, 8:05 AM

## 2017-05-02 NOTE — Progress Notes (Signed)
ANTICOAGULATION CONSULT NOTE   Pharmacy Consult for Warfarin Indication: hx DVT, VTE prophylaxis s/p THA  No Known Allergies  Patient Measurements: Height: 5\' 8"  (172.7 cm) Weight: 211 lb (95.7 kg) IBW/kg (Calculated) : 68.4  Vital Signs: Temp: 98.2 F (36.8 C) (08/02 1041) Temp Source: Oral (08/02 1041) BP: 126/63 (08/02 1041) Pulse Rate: 63 (08/02 1041)  Labs:  Recent Labs  05/01/17 0630 05/02/17 0540  HGB  --  10.3*  HCT  --  30.5*  PLT  --  145*  LABPROT 13.3 14.4  INR 1.01 1.12  CREATININE  --  0.80   Estimated Creatinine Clearance: 88.1 mL/min (by C-G formula based on SCr of 0.8 mg/dL).  Medications:  Scheduled:  . docusate sodium  100 mg Oral BID  . enoxaparin (LOVENOX) injection  30 mg Subcutaneous Q12H  . pantoprazole  40 mg Oral Daily  . Warfarin - Pharmacist Dosing Inpatient   Does not apply q1800   Assessment: 89 yoM s/p R THA, anterior approach. Hx of DVT on chronic Warfarin 5mg  daily, LD 7/26. Admit INT = 1.01.  Pharmacy is consulted to resume warfarin dosing.  Today, 05/02/2017:  INR 1.12, subtherapeutic as expected after only one dose  CBC: Hgb 10.3, Plt 145k  No bleeding or complications reported, hemovac drain pulled.    Diet: regular  Lovenox 30 mg SQ q12h prophylaxis   Goal of Therapy:  INR 2-3 Monitor platelets by anticoagulation protocol: Yes   Plan:  Warfarin 7.5 mg PO x 1. Daily PT/INR. Monitor for signs and symptoms of bleeding.   Lynann Beaverhristine Efrata Brunner PharmD, BCPS Pager [phone] 05/02/2017 11:38 AM

## 2017-05-03 LAB — CBC
HEMATOCRIT: 33.3 % — AB (ref 39.0–52.0)
Hemoglobin: 11.1 g/dL — ABNORMAL LOW (ref 13.0–17.0)
MCH: 29 pg (ref 26.0–34.0)
MCHC: 33.3 g/dL (ref 30.0–36.0)
MCV: 86.9 fL (ref 78.0–100.0)
Platelets: 168 10*3/uL (ref 150–400)
RBC: 3.83 MIL/uL — ABNORMAL LOW (ref 4.22–5.81)
RDW: 15.3 % (ref 11.5–15.5)
WBC: 7.8 10*3/uL (ref 4.0–10.5)

## 2017-05-03 LAB — BASIC METABOLIC PANEL
ANION GAP: 8 (ref 5–15)
BUN: 19 mg/dL (ref 6–20)
CALCIUM: 8.9 mg/dL (ref 8.9–10.3)
CO2: 25 mmol/L (ref 22–32)
Chloride: 105 mmol/L (ref 101–111)
Creatinine, Ser: 0.73 mg/dL (ref 0.61–1.24)
GFR calc Af Amer: >60 mL/min (ref >60)
GFR calc non Af Amer: >60 mL/min (ref >60)
GLUCOSE: 117 mg/dL — AB (ref 65–99)
POTASSIUM: 3.9 mmol/L (ref 3.5–5.1)
Sodium: 138 mmol/L (ref 135–145)

## 2017-05-03 LAB — PROTIME-INR
INR: 1.31
Prothrombin Time: 16.4 seconds — ABNORMAL HIGH (ref 11.4–15.2)

## 2017-05-03 MED ORDER — BISACODYL 10 MG RE SUPP
10.0000 mg | Freq: Once | RECTAL | Status: AC
  Administered 2017-05-03: 10 mg via RECTAL
  Filled 2017-05-03: qty 1

## 2017-05-03 MED ORDER — ENOXAPARIN SODIUM 40 MG/0.4ML ~~LOC~~ SOLN: 40.0000 mg | SUBCUTANEOUS | 0 refills | Status: DC

## 2017-05-03 MED ORDER — TRAMADOL HCL 50 MG PO TABS: 50.0000 mg | ORAL_TABLET | Freq: Four times a day (QID) | ORAL | 0 refills | Status: DC | PRN

## 2017-05-03 MED ORDER — TIZANIDINE HCL 4 MG PO TABS: 4.0000 mg | ORAL_TABLET | Freq: Three times a day (TID) | ORAL | 0 refills | Status: DC | PRN

## 2017-05-03 MED ORDER — OXYCODONE HCL 5 MG PO TABS: 5.0000 mg | ORAL_TABLET | ORAL | 0 refills | Status: DC | PRN

## 2017-05-03 NOTE — Progress Notes (Signed)
ANTICOAGULATION CONSULT NOTE   Pharmacy Consult for Warfarin Indication: hx DVT, VTE prophylaxis s/p THA  No Known Allergies  Patient Measurements: Height: 5\' 8"  (172.7 cm) Weight: 211 lb (95.7 kg) IBW/kg (Calculated) : 68.4  Vital Signs: Temp: 98.6 F (37 C) (08/03 0521) Temp Source: Oral (08/03 0521) BP: 144/63 (08/03 0521) Pulse Rate: 75 (08/03 0521)  Labs:  Recent Labs  05/01/17 0630 05/02/17 0540 05/03/17 0523  HGB  --  10.3* 11.1*  HCT  --  30.5* 33.3*  PLT  --  145* 168  LABPROT 13.3 14.4 16.4*  INR 1.01 1.12 1.31  CREATININE  --  0.80 0.73   Estimated Creatinine Clearance: 88.1 mL/min (by C-G formula based on SCr of 0.73 mg/dL).  Medications:  Scheduled:  . docusate sodium  100 mg Oral BID  . enoxaparin (LOVENOX) injection  30 mg Subcutaneous Q12H  . pantoprazole  40 mg Oral Daily  . Warfarin - Pharmacist Dosing Inpatient   Does not apply q1800   Assessment: [age] yoM s/p R THA, anterior approach. Hx of DVT on chronic Warfarin 5mg  daily, LD 7/26. Admit INT = 1.01.  Pharmacy is consulted to resume warfarin dosing.  Today, 05/03/2017:  INR 1.31, subtherapeutic but increased   CBC: Hgb stable at 11.1, Plt 268k  No bleeding or complications reported, hemovac drain pulled.    Diet: regular  Lovenox 30 mg SQ q12h prophylaxis   Goal of Therapy:  INR 2-3 Monitor platelets by anticoagulation protocol: Yes   Plan:   Warfarin 7.5 mg PO x 1 tonight, then resume prior dosing of 5 mg daily  Daily PT/INR while inpatient.  F/u outpatient INR w/in one week.  Monitor for signs and symptoms of bleeding.   Lynann Beaverhristine Baylin Cabal PharmD, BCPS Pager [phone] 05/03/2017 12:13 PM

## 2017-05-03 NOTE — Discharge Summary (Signed)
Physician Discharge Summary   Patient ID: Jose Bush MRN: [id number] DOB/AGE: 1941-02-02 76 y.o.  Admit date: 05/01/2017 Discharge date: 05/03/2017  Primary Diagnosis:  Osteoarthritis of the Right hip.   Admission Diagnoses:  Past Medical History:  Diagnosis Date  . Arthritis   . Chest pain, unspecified   . DVT (deep venous thrombosis) ([place])   . GERD (gastroesophageal reflux disease)   . Heart murmur    hx of   . History of hiatal hernia   . HTN (hypertension)   . Lupus   . Lupus   . Shingles    hx of   . Stroke [facility]) 2010   'MILD" per pt-  weakness left side  . Tinnitus   . Ulcer of abdomen wall [facility])    Discharge Diagnoses:   Active Problems:   OA (osteoarthritis) of hip  Estimated body mass index is 32.08 kg/m as calculated from the following:   Height as of this encounter: 5' 8"  (1.727 m).   Weight as of this encounter: 95.7 kg (211 lb).  Procedure(s) (LRB): RIGHT TOTAL HIP ARTHROPLASTY ANTERIOR APPROACH (Right)   Consults: None  HPI: Jose Bush is a 76 y.o. male who has advanced end-  stage arthritis of their Right  hip with progressively worsening pain and  dysfunction.The patient has failed nonoperative management and presents for  total hip arthroplasty.   Laboratory Data: Admission on 05/01/2017  Component Date Value Ref Range Status  . Prothrombin Time 05/01/2017 13.3  11.4 - 15.2 seconds Final  . INR 05/01/2017 1.01   Final  . WBC 05/02/2017 10.0  4.0 - 10.5 K/uL Final  . RBC 05/02/2017 3.53* 4.22 - 5.81 MIL/uL Final  . Hemoglobin 05/02/2017 10.3* 13.0 - 17.0 g/dL Final  . HCT 05/02/2017 30.5* 39.0 - 52.0 % Final  . MCV 05/02/2017 86.4  78.0 - 100.0 fL Final  . MCH 05/02/2017 29.2  26.0 - 34.0 pg Final  . MCHC 05/02/2017 33.8  30.0 - 36.0 g/dL Final  . RDW 05/02/2017 15.3  11.5 - 15.5 % Final  . Platelets 05/02/2017 145* 150 - 400 K/uL Final  . Sodium 05/02/2017 137  135 - 145 mmol/L Final  . Potassium 05/02/2017 4.1  3.5 - 5.1 mmol/L  Final  . Chloride 05/02/2017 106  101 - 111 mmol/L Final  . CO2 05/02/2017 25  22 - 32 mmol/L Final  . Glucose, Bld 05/02/2017 157* 65 - 99 mg/dL Final  . BUN 05/02/2017 19  6 - 20 mg/dL Final  . Creatinine, Ser 05/02/2017 0.80  0.61 - 1.24 mg/dL Final  . Calcium 05/02/2017 8.6* 8.9 - 10.3 mg/dL Final  . GFR calc non Af Amer 05/02/2017 >60  >60 mL/min Final  . GFR calc Af Amer 05/02/2017 >60  >60 mL/min Final   Comment: (NOTE) The eGFR has been calculated using the CKD EPI equation. This calculation has not been validated in all clinical situations. eGFR's persistently <60 mL/min signify possible Chronic Kidney Disease.   . Anion gap 05/02/2017 6  5 - 15 Final  . Prothrombin Time 05/02/2017 14.4  11.4 - 15.2 seconds Final  . INR 05/02/2017 1.12   Final  . WBC 05/03/2017 7.8  4.0 - 10.5 K/uL Final  . RBC 05/03/2017 3.83* 4.22 - 5.81 MIL/uL Final  . Hemoglobin 05/03/2017 11.1* 13.0 - 17.0 g/dL Final  . HCT 05/03/2017 33.3* 39.0 - 52.0 % Final  . MCV 05/03/2017 86.9  78.0 - 100.0 fL Final  . MCH  05/03/2017 29.0  26.0 - 34.0 pg Final  . MCHC 05/03/2017 33.3  30.0 - 36.0 g/dL Final  . RDW 05/03/2017 15.3  11.5 - 15.5 % Final  . Platelets 05/03/2017 168  150 - 400 K/uL Final  . Sodium 05/03/2017 138  135 - 145 mmol/L Final  . Potassium 05/03/2017 3.9  3.5 - 5.1 mmol/L Final  . Chloride 05/03/2017 105  101 - 111 mmol/L Final  . CO2 05/03/2017 25  22 - 32 mmol/L Final  . Glucose, Bld 05/03/2017 117* 65 - 99 mg/dL Final  . BUN 05/03/2017 19  6 - 20 mg/dL Final  . Creatinine, Ser 05/03/2017 0.73  0.61 - 1.24 mg/dL Final  . Calcium 05/03/2017 8.9  8.9 - 10.3 mg/dL Final  . GFR calc non Af Amer 05/03/2017 >60  >60 mL/min Final  . GFR calc Af Amer 05/03/2017 >60  >60 mL/min Final   Comment: (NOTE) The eGFR has been calculated using the CKD EPI equation. This calculation has not been validated in all clinical situations. eGFR's persistently <60 mL/min signify possible Chronic  Kidney Disease.   . Anion gap 05/03/2017 8  5 - 15 Final  . Prothrombin Time 05/03/2017 16.4* 11.4 - 15.2 seconds Final  . INR 05/03/2017 1.31   Final  Hospital Outpatient Visit on 04/26/2017  Component Date Value Ref Range Status  . aPTT 04/26/2017 41* 24 - 36 seconds Final   Comment:        IF BASELINE aPTT IS ELEVATED, SUGGEST PATIENT RISK ASSESSMENT BE USED TO DETERMINE APPROPRIATE ANTICOAGULANT THERAPY.   . WBC 04/26/2017 5.4  4.0 - 10.5 K/uL Final  . RBC 04/26/2017 4.64  4.22 - 5.81 MIL/uL Final  . Hemoglobin 04/26/2017 13.5  13.0 - 17.0 g/dL Final  . HCT 04/26/2017 39.7  39.0 - 52.0 % Final  . MCV 04/26/2017 85.6  78.0 - 100.0 fL Final  . MCH 04/26/2017 29.1  26.0 - 34.0 pg Final  . MCHC 04/26/2017 34.0  30.0 - 36.0 g/dL Final  . RDW 04/26/2017 15.3  11.5 - 15.5 % Final  . Platelets 04/26/2017 167  150 - 400 K/uL Final  . Sodium 04/26/2017 139  135 - 145 mmol/L Final  . Potassium 04/26/2017 4.2  3.5 - 5.1 mmol/L Final  . Chloride 04/26/2017 105  101 - 111 mmol/L Final  . CO2 04/26/2017 24  22 - 32 mmol/L Final  . Glucose, Bld 04/26/2017 86  65 - 99 mg/dL Final  . BUN 04/26/2017 19  6 - 20 mg/dL Final  . Creatinine, Ser 04/26/2017 0.77  0.61 - 1.24 mg/dL Final  . Calcium 04/26/2017 9.3  8.9 - 10.3 mg/dL Final  . Total Protein 04/26/2017 7.2  6.5 - 8.1 g/dL Final  . Albumin 04/26/2017 4.1  3.5 - 5.0 g/dL Final  . AST 04/26/2017 42* 15 - 41 U/L Final  . ALT 04/26/2017 37  17 - 63 U/L Final  . Alkaline Phosphatase 04/26/2017 48  38 - 126 U/L Final  . Total Bilirubin 04/26/2017 0.6  0.3 - 1.2 mg/dL Final  . GFR calc non Af Amer 04/26/2017 >60  >60 mL/min Final  . GFR calc Af Amer 04/26/2017 >60  >60 mL/min Final   Comment: (NOTE) The eGFR has been calculated using the CKD EPI equation. This calculation has not been validated in all clinical situations. eGFR's persistently <60 mL/min signify possible Chronic Kidney Disease.   . Anion gap 04/26/2017 10  5 - 15 Final  .  Prothrombin Time 04/26/2017 21.6* 11.4 - 15.2 seconds Final  . INR 04/26/2017 1.85   Final  . ABO/RH(D) 04/26/2017 O POS   Final  . Antibody Screen 04/26/2017 NEG   Final  . Sample Expiration 04/26/2017 05/04/2017   Final  . Extend sample reason 04/26/2017 NO TRANSFUSIONS OR PREGNANCY IN THE PAST 3 MONTHS   Final  . MRSA, PCR 04/26/2017 NEGATIVE  NEGATIVE Final  . Staphylococcus aureus 04/26/2017 NEGATIVE  NEGATIVE Final   Comment:        The Xpert SA Assay (FDA approved for NASAL specimens in patients over 61 years of age), is one component of a comprehensive surveillance program.  Test performance has been validated by [facility] for patients greater than or equal to 25 year old. It is not intended to diagnose infection nor to guide or monitor treatment.      X-Rays:Dg Pelvis Portable  Result Date: 05/01/2017 CLINICAL DATA:  Status post total hip replacement on the right. EXAM: PORTABLE PELVIS 1-2 VIEWS COMPARISON:  None. FINDINGS: Frontal view obtained. There is a total hip replacement on the right with prosthetic components well-seated. No acute fracture or dislocation. There is mild narrowing of the left hip joint. A surgical drain is noted on the right with soft tissue air on the right, expected postoperative findings. Sacroiliac joints appear unremarkable bilaterally. IMPRESSION: Total hip replacement on the right with prosthetic components well-seated. No acute fracture or dislocation. Slight narrowing left hip joint. Electronically Signed   By: Lowella Grip III M.D.   On: 05/01/2017 10:50   Dg C-arm 1-60 Min-no Report  Result Date: 05/01/2017 Fluoroscopy was utilized by the requesting physician.  No radiographic interpretation.    EKG: Orders placed or performed during the hospital encounter of 04/26/17  . EKG 12 lead  . EKG 12 lead     Hospital Course: Patient was admitted to [facility] and taken to the OR and underwent the above state procedure without  complications.  Patient tolerated the procedure well and was later transferred to the recovery room and then to the orthopaedic floor for postoperative care.  They were given PO and IV analgesics for pain control following their surgery.  They were given 24 hours of postoperative antibiotics of  Anti-infectives    Start     Dose/Rate Route Frequency Ordered Stop   05/01/17 1600  ceFAZolin (ANCEF) IVPB 2g/100 mL premix     2 g 200 mL/hr over 30 Minutes Intravenous Every 6 hours 05/01/17 1136 05/01/17 2311   05/01/17 0618  ceFAZolin (ANCEF) 2-4 GM/100ML-% IVPB    Comments:  Bridget Hartshorn   : cabinet override      05/01/17 0618 05/01/17 0823   05/01/17 0617  ceFAZolin (ANCEF) IVPB 2g/100 mL premix     2 g 200 mL/hr over 30 Minutes Intravenous On call to O.R. 05/01/17 7510 05/01/17 0853     and started on DVT prophylaxis in the form of Lovenox and Coumadin.   PT and OT were ordered for total hip protocol.  The patient was allowed to be WBAT with therapy. Discharge planning was consulted to help with postop disposition and equipment needs.  Patient had a decent night on the evening of surgery and took a few steps in the room that night.  They started to get up OOB with therapy on day one.  Hemovac drain was pulled without difficulty.  INR onPOD 1 was 1.12.  Continued to work with therapy into day two.  Dressing  was changed on day two and the incision was healing well. Patient was seen in rounds by Dr. Wynelle Link and was ready to go home.  INR at time of discharge was 1.31.  He was sent home with three days of Lovenox for coverage until the INR is back up.  Diet: Cardiac diet Activity:WBAT Follow-up:in 2 weeks Disposition - Home Discharged Condition: stable   Discharge Instructions    Call MD / Call 911    Complete by:  As directed    If you experience chest pain or shortness of breath, CALL 911 and be transported to the hospital emergency room.  If you develope a fever above 101 F, pus (white  drainage) or increased drainage or redness at the wound, or calf pain, call your surgeon's office.   Change dressing    Complete by:  As directed    You may change your dressing dressing daily with sterile 4 x 4 inch gauze dressing and paper tape.  Do not submerge the incision under water.   Constipation Prevention    Complete by:  As directed    Drink plenty of fluids.  Prune juice may be helpful.  You may use a stool softener, such as Colace (over the counter) 100 mg twice a day.  Use MiraLax (over the counter) for constipation as needed.   Diet - low sodium heart healthy    Complete by:  As directed    Discharge instructions    Complete by:  As directed    Take Loveonx injections daily at home on Saturday 8/4, Sunday 8/5, and Monday 8/6.  Resume the Coumadin home dosing following discharge from the hospital.  Pick up stool softner and laxative for home use following surgery while on pain medications. Do not submerge incision under water. Please use good hand washing techniques while changing dressing each day. May shower starting three days after surgery. Please use a clean towel to pat the incision dry following showers. Continue to use ice for pain and swelling after surgery. Do not use any lotions or creams on the incision until instructed by your surgeon.  Wear both TED hose on both legs during the day every day for three weeks, but may remove the TED hose at night at home.  Postoperative Constipation Protocol  Constipation - defined medically as fewer than three stools per week and severe constipation as less than one stool per week.  One of the most common issues patients have following surgery is constipation.  Even if you have a regular bowel pattern at home, your normal regimen is likely to be disrupted due to multiple reasons following surgery.  Combination of anesthesia, postoperative narcotics, change in appetite and fluid intake all can affect your bowels.  In order to  avoid complications following surgery, here are some recommendations in order to help you during your recovery period.  Colace (docusate) - Pick up an over-the-counter form of Colace or another stool softener and take twice a day as long as you are requiring postoperative pain medications.  Take with a full glass of water daily.  If you experience loose stools or diarrhea, hold the colace until you stool forms back up.  If your symptoms do not get better within 1 week or if they get worse, check with your doctor.  Dulcolax (bisacodyl) - Pick up over-the-counter and take as directed by the product packaging as needed to assist with the movement of your bowels.  Take with a full glass of water.  Use this product as needed if not relieved by Colace only.   MiraLax (polyethylene glycol) - Pick up over-the-counter to have on hand.  MiraLax is a solution that will increase the amount of water in your bowels to assist with bowel movements.  Take as directed and can mix with a glass of water, juice, soda, coffee, or tea.  Take if you go more than two days without a movement. Do not use MiraLax more than once per day. Call your doctor if you are still constipated or irregular after using this medication for 7 days in a row.  If you continue to have problems with postoperative constipation, please contact the office for further assistance and recommendations.  If you experience "the worst abdominal pain ever" or develop nausea or vomiting, please contact the office immediatly for further recommendations for treatment.   Do not sit on low chairs, stoools or toilet seats, as it may be difficult to get up from low surfaces    Complete by:  As directed    Driving restrictions    Complete by:  As directed    No driving until released by the physician.   Increase activity slowly as tolerated    Complete by:  As directed    Lifting restrictions    Complete by:  As directed    No lifting until released by the  physician.   Patient may shower    Complete by:  As directed    You may shower without a dressing once there is no drainage.  Do not wash over the wound.  If drainage remains, do not shower until drainage stops.   TED hose    Complete by:  As directed    Use stockings (TED hose) for 3 weeks on both leg(s).  You may remove them at night for sleeping.   Weight bearing as tolerated    Complete by:  As directed    Laterality:  right   Extremity:  Lower     Allergies as of 05/03/2017   No Known Allergies     Medication List    STOP taking these medications   hydroxychloroquine 200 MG tablet Commonly known as:  PLAQUENIL     TAKE these medications   acetaminophen 500 MG tablet Commonly known as:  TYLENOL Take 1,000 mg by mouth every 6 (six) hours as needed for moderate pain or headache.   aspirin EC 81 MG tablet Take 81 mg by mouth daily.   CALCIUM 600 + D PO Take 1 tablet by mouth daily.   docusate sodium 100 MG capsule Commonly known as:  COLACE Take 100 mg by mouth 2 (two) times daily as needed for mild constipation.   enoxaparin 40 MG/0.4ML injection Commonly known as:  LOVENOX Inject 0.4 mLs (40 mg total) into the skin daily. Take daily for the next three days - Sat., Sun., and Monday.   Fish Oil 1000 MG Cpdr Take 1,000 mg by mouth daily.   ICY HOT EX Apply 1 application topically 2 (two) times daily as needed (left knee.).   lisinopril-hydrochlorothiazide 10-12.5 MG tablet Commonly known as:  PRINZIDE,ZESTORETIC Take 1 tablet by mouth daily with breakfast.   multivitamin with minerals Tabs tablet Take 1 tablet by mouth daily. Centrum Silver   NITROSTAT 0.4 MG SL tablet Generic drug:  nitroGLYCERIN Place 0.4 mg under the tongue every 5 (five) minutes as needed for chest pain. x3 doses as needed for chest pain   omeprazole 20 MG capsule  Commonly known as:  PRILOSEC Take 20 mg by mouth daily with breakfast.   oxyCODONE 5 MG immediate release tablet Commonly  known as:  Oxy IR/ROXICODONE Take 1-2 tablets (5-10 mg total) by mouth every 4 (four) hours as needed for moderate pain or severe pain.   tiZANidine 4 MG tablet Commonly known as:  ZANAFLEX Take 1 tablet (4 mg total) by mouth every 8 (eight) hours as needed for muscle spasms.   traMADol 50 MG tablet Commonly known as:  ULTRAM Take 1-2 tablets (50-100 mg total) by mouth every 6 (six) hours as needed for moderate pain.   URINOZINC PO Take 1 capsule by mouth daily.   warfarin 5 MG tablet Commonly known as:  COUMADIN Take 1 tablet (5 mg total) by mouth every morning. Take Coumadin for three weeks for postoperative protocol and then the patient may resume their previous Coumadin home regimen.  The dose may need to be adjusted based upon the INR.  Please follow the INR and titrate Coumadin dose for a therapeutic range between 2.0 and 3.0 INR.  After completing the three weeks of Coumadin, the patient may resume their previous Coumadin home regimen. What changed:  when to take this  additional instructions      Follow-up Information    [facility] Follow up.   Specialty:  [place] Why:  physical therapy Contact information: [address] [place] [address] [phone]        [name], MD. Schedule an appointment as soon as possible for a visit on 05/16/2017.   Specialty:  Orthopedic Surgery Why:  Follow up with Dian Situ on Thursday 05/16/2017. Call for appointment. Contact information: [address] [place] [address] [phone]           Signed: Arlee Muslim, PA-C Orthopaedic Surgery 05/03/2017, 7:40 AM

## 2017-05-03 NOTE — Discharge Instructions (Signed)
° °Dr. Frank Aluisio °Total Joint Specialist °[facility] °[address] °[place], [place] [address] °[phone] ° °ANTERIOR APPROACH TOTAL HIP REPLACEMENT POSTOPERATIVE DIRECTIONS ° ° °Hip Rehabilitation, Guidelines Following Surgery  °The results of a hip operation are greatly improved after range of motion and muscle strengthening exercises. Follow all safety measures which are given to protect your hip. If any of these exercises cause increased pain or swelling in your joint, decrease the amount until you are comfortable again. Then slowly increase the exercises. Call your caregiver if you have problems or questions.  ° °HOME CARE INSTRUCTIONS  °Remove items at home which could result in a fall. This includes throw rugs or furniture in walking pathways.  °· ICE to the affected hip every three hours for 30 minutes at a time and then as needed for pain and swelling.  Continue to use ice on the hip for pain and swelling from surgery. You may notice swelling that will progress down to the foot and ankle.  This is normal after surgery.  Elevate the leg when you are not up walking on it.   °· Continue to use the breathing machine which will help keep your temperature down.  It is common for your temperature to cycle up and down following surgery, especially at night when you are not up moving around and exerting yourself.  The breathing machine keeps your lungs expanded and your temperature down. ° ° °DIET °You may resume your previous home diet once your are discharged from the hospital. ° °DRESSING / WOUND CARE / SHOWERING °You may shower 3 days after surgery, but keep the wounds dry during showering.  You may use an occlusive plastic wrap (Press'n Seal for example), NO SOAKING/SUBMERGING IN THE BATHTUB.  If the bandage gets wet, change with a clean dry gauze.  If the incision gets wet, pat the wound dry with a clean towel. °You may start showering once you are discharged home but do not  submerge the incision under water. Just pat the incision dry and apply a dry gauze dressing on daily. °Change the surgical dressing daily and reapply a dry dressing each time. ° °ACTIVITY °Walk with your walker as instructed. °Use walker as long as suggested by your caregivers. °Avoid periods of inactivity such as sitting longer than an hour when not asleep. This helps prevent blood clots.  °You may resume a sexual relationship in one month or when given the OK by your doctor.  °You may return to work once you are cleared by your doctor.  °Do not drive a car for 6 weeks or until released by you surgeon.  °Do not drive while taking narcotics. ° °WEIGHT BEARING °Weight bearing as tolerated with assist device (walker, cane, etc) as directed, use it as long as suggested by your surgeon or therapist, typically at least 4-6 weeks. ° °POSTOPERATIVE CONSTIPATION PROTOCOL °Constipation - defined medically as fewer than three stools per week and severe constipation as less than one stool per week. ° °One of the most common issues patients have following surgery is constipation.  Even if you have a regular bowel pattern at home, your normal regimen is likely to be disrupted due to multiple reasons following surgery.  Combination of anesthesia, postoperative narcotics, change in appetite and fluid intake all can affect your bowels.  In order to avoid complications following surgery, here are some recommendations in order to help you during your recovery period. ° °Colace (docusate) - Pick up an over-the-counter   form of Colace or another stool softener and take twice a day as long as you are requiring postoperative pain medications.  Take with a full glass of water daily.  If you experience loose stools or diarrhea, hold the colace until you stool forms back up.  If your symptoms do not get better within 1 week or if they get worse, check with your doctor. ° °Dulcolax (bisacodyl) - Pick up over-the-counter and take as directed  by the product packaging as needed to assist with the movement of your bowels.  Take with a full glass of water.  Use this product as needed if not relieved by Colace only.  ° °MiraLax (polyethylene glycol) - Pick up over-the-counter to have on hand.  MiraLax is a solution that will increase the amount of water in your bowels to assist with bowel movements.  Take as directed and can mix with a glass of water, juice, soda, coffee, or tea.  Take if you go more than two days without a movement. °Do not use MiraLax more than once per day. Call your doctor if you are still constipated or irregular after using this medication for 7 days in a row. ° °If you continue to have problems with postoperative constipation, please contact the office for further assistance and recommendations.  If you experience "the worst abdominal pain ever" or develop nausea or vomiting, please contact the office immediatly for further recommendations for treatment. ° °ITCHING ° If you experience itching with your medications, try taking only a single pain pill, or even half a pain pill at a time.  You can also use Benadryl over the counter for itching or also to help with sleep.  ° °TED HOSE STOCKINGS °Wear the elastic stockings on both legs for three weeks following surgery during the day but you may remove then at night for sleeping. ° °MEDICATIONS °See your medication summary on the “After Visit Summary” that the nursing staff will review with you prior to discharge.  You may have some home medications which will be placed on hold until you complete the course of blood thinner medication.  It is important for you to complete the blood thinner medication as prescribed by your surgeon.  Continue your approved medications as instructed at time of discharge. ° °PRECAUTIONS °If you experience chest pain or shortness of breath - call 911 immediately for transfer to the hospital emergency department.  °If you develop a fever greater that 101 F,  purulent drainage from wound, increased redness or drainage from wound, foul odor from the wound/dressing, or calf pain - CONTACT YOUR SURGEON.   °                                                °FOLLOW-UP APPOINTMENTS °Make sure you keep all of your appointments after your operation with your surgeon and caregivers. You should call the office at the above phone number and make an appointment for approximately two weeks after the date of your surgery or on the date instructed by your surgeon outlined in the "After Visit Summary". ° °RANGE OF MOTION AND STRENGTHENING EXERCISES  °These exercises are designed to help you keep full movement of your hip joint. Follow your caregiver's or physical therapist's instructions. Perform all exercises about fifteen times, three times per day or as directed. Exercise both hips, even if you   have had only one joint replacement. These exercises can be done on a training (exercise) mat, on the floor, on a table or on a bed. Use whatever works the best and is most comfortable for you. Use music or television while you are exercising so that the exercises are a pleasant break in your day. This will make your life better with the exercises acting as a break in routine you can look forward to.  Lying on your back, slowly slide your foot toward your buttocks, raising your knee up off the floor. Then slowly slide your foot back down until your leg is straight again.  Lying on your back spread your legs as far apart as you can without causing discomfort.  Lying on your side, raise your upper leg and foot straight up from the floor as far as is comfortable. Slowly lower the leg and repeat.  Lying on your back, tighten up the muscle in the front of your thigh (quadriceps muscles). You can do this by keeping your leg straight and trying to raise your heel off the floor. This helps strengthen the largest muscle supporting your knee.  Lying on your back, tighten up the muscles of your  buttocks both with the legs straight and with the knee bent at a comfortable angle while keeping your heel on the floor.   IF YOU ARE TRANSFERRED TO A SKILLED REHAB FACILITY If the patient is transferred to a skilled rehab facility following release from the hospital, a list of the current medications will be sent to the facility for the patient to continue.  When discharged from the skilled rehab facility, please have the facility set up the patient's Home Health Physical Therapy prior to being released. Also, the skilled facility will be responsible for providing the patient with their medications at time of release from the facility to include their pain medication, the muscle relaxants, and their blood thinner medication. If the patient is still at the rehab facility at time of the two week follow up appointment, the skilled rehab facility will also need to assist the patient in arranging follow up appointment in our office and any transportation needs.  MAKE SURE YOU:  Understand these instructions.  Get help right away if you are not doing well or get worse.    Pick up stool softner and laxative for home use following surgery while on pain medications. Do not submerge incision under water. Please use good hand washing techniques while changing dressing each day. May shower starting three days after surgery. Please use a clean towel to pat the incision dry following showers. Continue to use ice for pain and swelling after surgery. Do not use any lotions or creams on the incision until instructed by your surgeon.  Take Loveonx injections daily at home on Saturday 8/4, Sunday 8/5, and Monday 8/6.  Resume the Coumadin home dosing following discharge from the hospital.

## 2017-05-03 NOTE — Progress Notes (Signed)
Physical Therapy Treatment Patient Details Name: Jose HastenWilliam L Stencil MRN: [id number] DOB: 1941/06/24 Today's Date: 05/03/2017    History of Present Illness Pt s/p R THR and with hx of Bil TKR, Lupus and CVA    PT Comments    POD # 2 Assisted with amb in hallway, practices stairs with spouse then performed some THR TE's followed by ICE.   Follow Up Recommendations  Home health PT;DC plan and follow up therapy as arranged by surgeon     Equipment Recommendations  None recommended by PT    Recommendations for Other Services       Precautions / Restrictions Precautions Precautions: Fall Restrictions Weight Bearing Restrictions: No Other Position/Activity Restrictions: WBAT    Mobility  Bed Mobility               General bed mobility comments: OOB in recliner  Transfers Overall transfer level: Needs assistance Equipment used: Rolling walker (2 wheeled) Transfers: Sit to/from Stand Sit to Stand: Supervision;Min guard         General transfer comment: cues for LE management and use of UEs to self assist  Ambulation/Gait Ambulation/Gait assistance: Supervision;Min guard Ambulation Distance (Feet): 15 Feet Assistive device: Rolling walker (2 wheeled) Gait Pattern/deviations: Step-to pattern;Decreased step length - right;Decreased step length - left;Shuffle;Trunk flexed Gait velocity: decreased   General Gait Details: limited distance due to c/o fatigue.  Pt reports poor sleeping last night.    Stairs Stairs: Yes   Stair Management: No rails;Step to pattern;Forwards;With walker Number of Stairs: 2 General stair comments: with spouse present "hands on" assist and 25% VC's on proper walker placement and sequencing  Wheelchair Mobility    Modified Rankin (Stroke Patients Only)       Balance                                            Cognition Arousal/Alertness: Awake/alert Behavior During Therapy: WFL for tasks  assessed/performed Overall Cognitive Status: Within Functional Limits for tasks assessed                                        Exercises   Total Hip Replacement TE's 10 reps ankle pumps 10 reps knee presses 10 reps heel slides 10 reps SAQ's 10 reps ABD Followed by ICE     General Comments        Pertinent Vitals/Pain Pain Assessment: 0-10 Pain Score: 3  Pain Location: R hip Pain Descriptors / Indicators: Aching;Sore;Operative site guarding Pain Intervention(s): Monitored during session;Repositioned;Ice applied    Home Living                      Prior Function            PT Goals (current goals can now be found in the care plan section) Progress towards PT goals: Progressing toward goals    Frequency    7X/week      PT Plan Current plan remains appropriate    Co-evaluation              AM-PAC PT "6 Clicks" Daily Activity  Outcome Measure  Difficulty turning over in bed (including adjusting bedclothes, sheets and blankets)?: Total Difficulty moving from lying on back to sitting on the side of the bed? :  Total Difficulty sitting down on and standing up from a chair with arms (e.g., wheelchair, bedside commode, etc,.)?: Total Help needed moving to and from a bed to chair (including a wheelchair)?: A Little Help needed walking in hospital room?: A Little Help needed climbing 3-5 steps with a railing? : A Lot 6 Click Score: 11    End of Session Equipment Utilized During Treatment: Gait belt Activity Tolerance: Patient tolerated treatment well Patient left: in chair;with call bell/phone within reach Nurse Communication:  (pt ready for D/C to home) PT Visit Diagnosis: Difficulty in walking, not elsewhere classified (R26.2)     Time: [id number] PT Time Calculation (min) (ACUTE ONLY): 26 min  Charges:  $Gait Training: 8-22 mins $Therapeutic Exercise: 8-22 mins                    G Codes:       Felecia ShellingLori Aunika Kirsten  PTA [facility]   Acute  Rehab Pager      [phone]

## 2017-05-03 NOTE — Progress Notes (Signed)
   Subjective: 2 Days Post-Op Procedure(s) (LRB): RIGHT TOTAL HIP ARTHROPLASTY ANTERIOR APPROACH (Right) Patient reports pain as mild.   Patient seen in rounds with Dr. Lequita HaltAluisio.  Patient sitting up in chair. Episode of nausea/vomiting last night.  None so far this morning. Patient is well, but has had some minor complaints of pain in the hip, requiring pain medications Patient is ready to go home later today if does well and no more nausea.  Objective: Vital signs in last 24 hours: Temp:  [98 F (36.7 C)-98.6 F (37 C)] 98.6 F (37 C) (08/03 0521) Pulse Rate:  [63-94] 75 (08/03 0521) Resp:  [16] 16 (08/03 0521) BP: (117-144)/(63-93) 144/63 (08/03 0521) SpO2:  [94 %-98 %] 95 % (08/03 0521)  Intake/Output from previous day:  Intake/Output Summary (Last 24 hours) at 05/03/17 0723 Last data filed at 05/03/17 0600  Gross per 24 hour  Intake          1592.33 ml  Output              650 ml  Net           942.33 ml    Intake/Output this shift: No intake/output data recorded.  Labs:  Recent Labs  05/02/17 0540 05/03/17 0523  HGB 10.3* 11.1*    Recent Labs  05/02/17 0540 05/03/17 0523  WBC 10.0 7.8  RBC 3.53* 3.83*  HCT 30.5* 33.3*  PLT 145* 168    Recent Labs  05/02/17 0540 05/03/17 0523  NA 137 138  K 4.1 3.9  CL 106 105  CO2 25 25  BUN 19 19  CREATININE 0.80 0.73  GLUCOSE 157* 117*  CALCIUM 8.6* 8.9    Recent Labs  05/02/17 0540 05/03/17 0523  INR 1.12 1.31    EXAM: General - Patient is Alert, Appropriate and Oriented Extremity - Neurovascular intact Sensation intact distally Intact pulses distally Dorsiflexion/Plantar flexion intact Incision - clean, dry, no drainage Motor Function - intact, moving foot and toes well on exam.   Assessment/Plan: 2 Days Post-Op Procedure(s) (LRB): RIGHT TOTAL HIP ARTHROPLASTY ANTERIOR APPROACH (Right) Procedure(s) (LRB): RIGHT TOTAL HIP ARTHROPLASTY ANTERIOR APPROACH (Right) Past Medical History:    Diagnosis Date  . Arthritis   . Chest pain, unspecified   . DVT (deep venous thrombosis) (HCC)   . GERD (gastroesophageal reflux disease)   . Heart murmur    hx of   . History of hiatal hernia   . HTN (hypertension)   . Lupus   . Lupus   . Shingles    hx of   . Stroke [facility](HCC) 2010   'MILD" per pt-  weakness left side  . Tinnitus   . Ulcer of abdomen wall (HCC)    Active Problems:   OA (osteoarthritis) of hip  Estimated body mass index is 32.08 kg/m as calculated from the following:   Height as of this encounter: 5\' 8"  (1.727 m).   Weight as of this encounter: 95.7 kg (211 lb). Up with therapy Discharge home with home health Diet - Cardiac diet Follow up - in 2 weeks Activity - WBAT Disposition - Home Condition Upon Discharge - home if improved D/C Meds - See DC Summary DVT Prophylaxis - Lovenox and Coumadin  INR is 1.31 - Will send home with three days of Lovenox injections.  Avel Peacerew Marlina Cataldi, PA-C Orthopaedic Surgery 05/03/2017, 7:23 AM

## 2017-05-04 DIAGNOSIS — Z471 Aftercare following joint replacement surgery: Secondary | ICD-10-CM | POA: Diagnosis not present

## 2017-05-04 DIAGNOSIS — L930 Discoid lupus erythematosus: Secondary | ICD-10-CM | POA: Diagnosis not present

## 2017-05-04 DIAGNOSIS — M810 Age-related osteoporosis without current pathological fracture: Secondary | ICD-10-CM | POA: Diagnosis not present

## 2017-05-04 DIAGNOSIS — I10 Essential (primary) hypertension: Secondary | ICD-10-CM | POA: Diagnosis not present

## 2017-05-04 DIAGNOSIS — I69354 Hemiplegia and hemiparesis following cerebral infarction affecting left non-dominant side: Secondary | ICD-10-CM | POA: Diagnosis not present

## 2017-05-04 DIAGNOSIS — M50322 Other cervical disc degeneration at C5-C6 level: Secondary | ICD-10-CM | POA: Diagnosis not present

## 2017-05-07 DIAGNOSIS — I10 Essential (primary) hypertension: Secondary | ICD-10-CM | POA: Diagnosis not present

## 2017-05-07 DIAGNOSIS — M810 Age-related osteoporosis without current pathological fracture: Secondary | ICD-10-CM | POA: Diagnosis not present

## 2017-05-07 DIAGNOSIS — I69354 Hemiplegia and hemiparesis following cerebral infarction affecting left non-dominant side: Secondary | ICD-10-CM | POA: Diagnosis not present

## 2017-05-07 DIAGNOSIS — Z471 Aftercare following joint replacement surgery: Secondary | ICD-10-CM | POA: Diagnosis not present

## 2017-05-07 DIAGNOSIS — I82503 Chronic embolism and thrombosis of unspecified deep veins of lower extremity, bilateral: Secondary | ICD-10-CM | POA: Diagnosis not present

## 2017-05-07 DIAGNOSIS — L930 Discoid lupus erythematosus: Secondary | ICD-10-CM | POA: Diagnosis not present

## 2017-05-07 DIAGNOSIS — M50322 Other cervical disc degeneration at C5-C6 level: Secondary | ICD-10-CM | POA: Diagnosis not present

## 2017-05-09 DIAGNOSIS — M50322 Other cervical disc degeneration at C5-C6 level: Secondary | ICD-10-CM | POA: Diagnosis not present

## 2017-05-09 DIAGNOSIS — L930 Discoid lupus erythematosus: Secondary | ICD-10-CM | POA: Diagnosis not present

## 2017-05-09 DIAGNOSIS — M810 Age-related osteoporosis without current pathological fracture: Secondary | ICD-10-CM | POA: Diagnosis not present

## 2017-05-09 DIAGNOSIS — Z471 Aftercare following joint replacement surgery: Secondary | ICD-10-CM | POA: Diagnosis not present

## 2017-05-09 DIAGNOSIS — I10 Essential (primary) hypertension: Secondary | ICD-10-CM | POA: Diagnosis not present

## 2017-05-09 DIAGNOSIS — I69354 Hemiplegia and hemiparesis following cerebral infarction affecting left non-dominant side: Secondary | ICD-10-CM | POA: Diagnosis not present

## 2017-05-10 DIAGNOSIS — L930 Discoid lupus erythematosus: Secondary | ICD-10-CM | POA: Diagnosis not present

## 2017-05-10 DIAGNOSIS — I69354 Hemiplegia and hemiparesis following cerebral infarction affecting left non-dominant side: Secondary | ICD-10-CM | POA: Diagnosis not present

## 2017-05-10 DIAGNOSIS — I82499 Acute embolism and thrombosis of other specified deep vein of unspecified lower extremity: Secondary | ICD-10-CM | POA: Diagnosis not present

## 2017-05-10 DIAGNOSIS — I10 Essential (primary) hypertension: Secondary | ICD-10-CM | POA: Diagnosis not present

## 2017-05-10 DIAGNOSIS — Z471 Aftercare following joint replacement surgery: Secondary | ICD-10-CM | POA: Diagnosis not present

## 2017-05-10 DIAGNOSIS — M50322 Other cervical disc degeneration at C5-C6 level: Secondary | ICD-10-CM | POA: Diagnosis not present

## 2017-05-10 DIAGNOSIS — M810 Age-related osteoporosis without current pathological fracture: Secondary | ICD-10-CM | POA: Diagnosis not present

## 2017-05-14 DIAGNOSIS — M50322 Other cervical disc degeneration at C5-C6 level: Secondary | ICD-10-CM | POA: Diagnosis not present

## 2017-05-14 DIAGNOSIS — L930 Discoid lupus erythematosus: Secondary | ICD-10-CM | POA: Diagnosis not present

## 2017-05-14 DIAGNOSIS — I69354 Hemiplegia and hemiparesis following cerebral infarction affecting left non-dominant side: Secondary | ICD-10-CM | POA: Diagnosis not present

## 2017-05-14 DIAGNOSIS — Z471 Aftercare following joint replacement surgery: Secondary | ICD-10-CM | POA: Diagnosis not present

## 2017-05-14 DIAGNOSIS — I82503 Chronic embolism and thrombosis of unspecified deep veins of lower extremity, bilateral: Secondary | ICD-10-CM | POA: Diagnosis not present

## 2017-05-14 DIAGNOSIS — I10 Essential (primary) hypertension: Secondary | ICD-10-CM | POA: Diagnosis not present

## 2017-05-14 DIAGNOSIS — M810 Age-related osteoporosis without current pathological fracture: Secondary | ICD-10-CM | POA: Diagnosis not present

## 2017-05-15 DIAGNOSIS — Z96641 Presence of right artificial hip joint: Secondary | ICD-10-CM | POA: Diagnosis not present

## 2017-05-15 DIAGNOSIS — Z96651 Presence of right artificial knee joint: Secondary | ICD-10-CM | POA: Diagnosis not present

## 2017-05-15 DIAGNOSIS — Z471 Aftercare following joint replacement surgery: Secondary | ICD-10-CM | POA: Diagnosis not present

## 2017-05-15 DIAGNOSIS — R2689 Other abnormalities of gait and mobility: Secondary | ICD-10-CM | POA: Diagnosis not present

## 2017-05-15 DIAGNOSIS — M1611 Unilateral primary osteoarthritis, right hip: Secondary | ICD-10-CM | POA: Diagnosis not present

## 2017-05-17 DIAGNOSIS — R2689 Other abnormalities of gait and mobility: Secondary | ICD-10-CM | POA: Diagnosis not present

## 2017-05-17 DIAGNOSIS — Z471 Aftercare following joint replacement surgery: Secondary | ICD-10-CM | POA: Diagnosis not present

## 2017-05-17 DIAGNOSIS — I82499 Acute embolism and thrombosis of other specified deep vein of unspecified lower extremity: Secondary | ICD-10-CM | POA: Diagnosis not present

## 2017-05-17 DIAGNOSIS — M1611 Unilateral primary osteoarthritis, right hip: Secondary | ICD-10-CM | POA: Diagnosis not present

## 2017-05-17 DIAGNOSIS — Z96641 Presence of right artificial hip joint: Secondary | ICD-10-CM | POA: Diagnosis not present

## 2017-05-17 DIAGNOSIS — Z96651 Presence of right artificial knee joint: Secondary | ICD-10-CM | POA: Diagnosis not present

## 2017-05-20 DIAGNOSIS — M1611 Unilateral primary osteoarthritis, right hip: Secondary | ICD-10-CM | POA: Diagnosis not present

## 2017-05-20 DIAGNOSIS — Z96651 Presence of right artificial knee joint: Secondary | ICD-10-CM | POA: Diagnosis not present

## 2017-05-20 DIAGNOSIS — Z471 Aftercare following joint replacement surgery: Secondary | ICD-10-CM | POA: Diagnosis not present

## 2017-05-20 DIAGNOSIS — Z96641 Presence of right artificial hip joint: Secondary | ICD-10-CM | POA: Diagnosis not present

## 2017-05-20 DIAGNOSIS — R2689 Other abnormalities of gait and mobility: Secondary | ICD-10-CM | POA: Diagnosis not present

## 2017-05-23 DIAGNOSIS — Z471 Aftercare following joint replacement surgery: Secondary | ICD-10-CM | POA: Diagnosis not present

## 2017-05-23 DIAGNOSIS — R2689 Other abnormalities of gait and mobility: Secondary | ICD-10-CM | POA: Diagnosis not present

## 2017-05-23 DIAGNOSIS — Z96651 Presence of right artificial knee joint: Secondary | ICD-10-CM | POA: Diagnosis not present

## 2017-05-23 DIAGNOSIS — M1611 Unilateral primary osteoarthritis, right hip: Secondary | ICD-10-CM | POA: Diagnosis not present

## 2017-05-23 DIAGNOSIS — Z96641 Presence of right artificial hip joint: Secondary | ICD-10-CM | POA: Diagnosis not present

## 2017-05-28 DIAGNOSIS — Z96641 Presence of right artificial hip joint: Secondary | ICD-10-CM | POA: Diagnosis not present

## 2017-05-28 DIAGNOSIS — Z96651 Presence of right artificial knee joint: Secondary | ICD-10-CM | POA: Diagnosis not present

## 2017-05-28 DIAGNOSIS — M1611 Unilateral primary osteoarthritis, right hip: Secondary | ICD-10-CM | POA: Diagnosis not present

## 2017-05-28 DIAGNOSIS — R2689 Other abnormalities of gait and mobility: Secondary | ICD-10-CM | POA: Diagnosis not present

## 2017-05-28 DIAGNOSIS — Z471 Aftercare following joint replacement surgery: Secondary | ICD-10-CM | POA: Diagnosis not present

## 2017-05-31 DIAGNOSIS — Z96651 Presence of right artificial knee joint: Secondary | ICD-10-CM | POA: Diagnosis not present

## 2017-05-31 DIAGNOSIS — Z471 Aftercare following joint replacement surgery: Secondary | ICD-10-CM | POA: Diagnosis not present

## 2017-05-31 DIAGNOSIS — M1611 Unilateral primary osteoarthritis, right hip: Secondary | ICD-10-CM | POA: Diagnosis not present

## 2017-05-31 DIAGNOSIS — I82499 Acute embolism and thrombosis of other specified deep vein of unspecified lower extremity: Secondary | ICD-10-CM | POA: Diagnosis not present

## 2017-05-31 DIAGNOSIS — R2689 Other abnormalities of gait and mobility: Secondary | ICD-10-CM | POA: Diagnosis not present

## 2017-05-31 DIAGNOSIS — Z96641 Presence of right artificial hip joint: Secondary | ICD-10-CM | POA: Diagnosis not present

## 2017-07-09 DIAGNOSIS — M1611 Unilateral primary osteoarthritis, right hip: Secondary | ICD-10-CM | POA: Diagnosis not present

## 2017-07-10 DIAGNOSIS — L723 Sebaceous cyst: Secondary | ICD-10-CM | POA: Diagnosis not present

## 2017-07-10 DIAGNOSIS — I10 Essential (primary) hypertension: Secondary | ICD-10-CM | POA: Diagnosis not present

## 2017-07-10 DIAGNOSIS — K219 Gastro-esophageal reflux disease without esophagitis: Secondary | ICD-10-CM | POA: Diagnosis not present

## 2017-07-10 DIAGNOSIS — I82499 Acute embolism and thrombosis of other specified deep vein of unspecified lower extremity: Secondary | ICD-10-CM | POA: Diagnosis not present

## 2017-07-10 DIAGNOSIS — I82503 Chronic embolism and thrombosis of unspecified deep veins of lower extremity, bilateral: Secondary | ICD-10-CM | POA: Diagnosis not present

## 2017-07-10 DIAGNOSIS — Z23 Encounter for immunization: Secondary | ICD-10-CM | POA: Diagnosis not present

## 2017-07-16 DIAGNOSIS — K219 Gastro-esophageal reflux disease without esophagitis: Secondary | ICD-10-CM | POA: Diagnosis not present

## 2017-07-16 DIAGNOSIS — I10 Essential (primary) hypertension: Secondary | ICD-10-CM | POA: Diagnosis not present

## 2017-07-16 DIAGNOSIS — M320 Drug-induced systemic lupus erythematosus: Secondary | ICD-10-CM | POA: Diagnosis not present

## 2017-07-16 DIAGNOSIS — R2689 Other abnormalities of gait and mobility: Secondary | ICD-10-CM | POA: Diagnosis not present

## 2017-07-16 DIAGNOSIS — Z6830 Body mass index (BMI) 30.0-30.9, adult: Secondary | ICD-10-CM | POA: Diagnosis not present

## 2017-07-16 DIAGNOSIS — Z23 Encounter for immunization: Secondary | ICD-10-CM | POA: Diagnosis not present

## 2017-07-16 DIAGNOSIS — Z96641 Presence of right artificial hip joint: Secondary | ICD-10-CM | POA: Diagnosis not present

## 2017-07-16 DIAGNOSIS — Z96651 Presence of right artificial knee joint: Secondary | ICD-10-CM | POA: Diagnosis not present

## 2017-07-16 DIAGNOSIS — Z471 Aftercare following joint replacement surgery: Secondary | ICD-10-CM | POA: Diagnosis not present

## 2017-07-16 DIAGNOSIS — M1611 Unilateral primary osteoarthritis, right hip: Secondary | ICD-10-CM | POA: Diagnosis not present

## 2017-07-16 DIAGNOSIS — I82503 Chronic embolism and thrombosis of unspecified deep veins of lower extremity, bilateral: Secondary | ICD-10-CM | POA: Diagnosis not present

## 2017-07-16 DIAGNOSIS — Z0001 Encounter for general adult medical examination with abnormal findings: Secondary | ICD-10-CM | POA: Diagnosis not present

## 2017-07-19 DIAGNOSIS — R2689 Other abnormalities of gait and mobility: Secondary | ICD-10-CM | POA: Diagnosis not present

## 2017-07-19 DIAGNOSIS — Z96641 Presence of right artificial hip joint: Secondary | ICD-10-CM | POA: Diagnosis not present

## 2017-07-19 DIAGNOSIS — Z471 Aftercare following joint replacement surgery: Secondary | ICD-10-CM | POA: Diagnosis not present

## 2017-07-19 DIAGNOSIS — Z96651 Presence of right artificial knee joint: Secondary | ICD-10-CM | POA: Diagnosis not present

## 2017-07-19 DIAGNOSIS — M1611 Unilateral primary osteoarthritis, right hip: Secondary | ICD-10-CM | POA: Diagnosis not present

## 2017-07-23 DIAGNOSIS — Z471 Aftercare following joint replacement surgery: Secondary | ICD-10-CM | POA: Diagnosis not present

## 2017-07-23 DIAGNOSIS — M1611 Unilateral primary osteoarthritis, right hip: Secondary | ICD-10-CM | POA: Diagnosis not present

## 2017-07-23 DIAGNOSIS — Z96641 Presence of right artificial hip joint: Secondary | ICD-10-CM | POA: Diagnosis not present

## 2017-07-23 DIAGNOSIS — R2689 Other abnormalities of gait and mobility: Secondary | ICD-10-CM | POA: Diagnosis not present

## 2017-07-23 DIAGNOSIS — Z96651 Presence of right artificial knee joint: Secondary | ICD-10-CM | POA: Diagnosis not present

## 2017-07-26 DIAGNOSIS — M1611 Unilateral primary osteoarthritis, right hip: Secondary | ICD-10-CM | POA: Diagnosis not present

## 2017-07-26 DIAGNOSIS — R2241 Localized swelling, mass and lump, right lower limb: Secondary | ICD-10-CM | POA: Diagnosis not present

## 2017-07-26 DIAGNOSIS — M7989 Other specified soft tissue disorders: Secondary | ICD-10-CM | POA: Diagnosis not present

## 2017-07-26 DIAGNOSIS — Z96641 Presence of right artificial hip joint: Secondary | ICD-10-CM | POA: Diagnosis not present

## 2017-07-26 DIAGNOSIS — Z471 Aftercare following joint replacement surgery: Secondary | ICD-10-CM | POA: Diagnosis not present

## 2017-07-26 DIAGNOSIS — R2689 Other abnormalities of gait and mobility: Secondary | ICD-10-CM | POA: Diagnosis not present

## 2017-07-26 DIAGNOSIS — Z96651 Presence of right artificial knee joint: Secondary | ICD-10-CM | POA: Diagnosis not present

## 2017-07-31 DIAGNOSIS — Z471 Aftercare following joint replacement surgery: Secondary | ICD-10-CM | POA: Diagnosis not present

## 2017-08-02 DIAGNOSIS — Z471 Aftercare following joint replacement surgery: Secondary | ICD-10-CM | POA: Diagnosis not present

## 2017-08-02 DIAGNOSIS — Z96641 Presence of right artificial hip joint: Secondary | ICD-10-CM | POA: Diagnosis not present

## 2017-08-02 DIAGNOSIS — Z96651 Presence of right artificial knee joint: Secondary | ICD-10-CM | POA: Diagnosis not present

## 2017-08-02 DIAGNOSIS — R2689 Other abnormalities of gait and mobility: Secondary | ICD-10-CM | POA: Diagnosis not present

## 2017-08-02 DIAGNOSIS — M1611 Unilateral primary osteoarthritis, right hip: Secondary | ICD-10-CM | POA: Diagnosis not present

## 2017-08-06 DIAGNOSIS — M1611 Unilateral primary osteoarthritis, right hip: Secondary | ICD-10-CM | POA: Diagnosis not present

## 2017-08-06 DIAGNOSIS — R2689 Other abnormalities of gait and mobility: Secondary | ICD-10-CM | POA: Diagnosis not present

## 2017-08-06 DIAGNOSIS — Z471 Aftercare following joint replacement surgery: Secondary | ICD-10-CM | POA: Diagnosis not present

## 2017-08-06 DIAGNOSIS — Z96651 Presence of right artificial knee joint: Secondary | ICD-10-CM | POA: Diagnosis not present

## 2017-08-06 DIAGNOSIS — Z96641 Presence of right artificial hip joint: Secondary | ICD-10-CM | POA: Diagnosis not present

## 2017-08-09 DIAGNOSIS — Z96641 Presence of right artificial hip joint: Secondary | ICD-10-CM | POA: Diagnosis not present

## 2017-08-09 DIAGNOSIS — M1611 Unilateral primary osteoarthritis, right hip: Secondary | ICD-10-CM | POA: Diagnosis not present

## 2017-08-09 DIAGNOSIS — Z471 Aftercare following joint replacement surgery: Secondary | ICD-10-CM | POA: Diagnosis not present

## 2017-08-09 DIAGNOSIS — Z96651 Presence of right artificial knee joint: Secondary | ICD-10-CM | POA: Diagnosis not present

## 2017-08-09 DIAGNOSIS — R2689 Other abnormalities of gait and mobility: Secondary | ICD-10-CM | POA: Diagnosis not present

## 2017-08-28 DIAGNOSIS — I82499 Acute embolism and thrombosis of other specified deep vein of unspecified lower extremity: Secondary | ICD-10-CM | POA: Diagnosis not present

## 2017-09-05 DIAGNOSIS — M1611 Unilateral primary osteoarthritis, right hip: Secondary | ICD-10-CM | POA: Diagnosis not present

## 2017-09-05 DIAGNOSIS — Z96641 Presence of right artificial hip joint: Secondary | ICD-10-CM | POA: Diagnosis not present

## 2017-09-05 DIAGNOSIS — Z471 Aftercare following joint replacement surgery: Secondary | ICD-10-CM | POA: Diagnosis not present

## 2017-09-05 DIAGNOSIS — Z96651 Presence of right artificial knee joint: Secondary | ICD-10-CM | POA: Diagnosis not present

## 2017-09-05 DIAGNOSIS — R2689 Other abnormalities of gait and mobility: Secondary | ICD-10-CM | POA: Diagnosis not present

## 2017-09-11 ENCOUNTER — Encounter: Payer: Self-pay | Admitting: Family Medicine

## 2017-09-11 DIAGNOSIS — M329 Systemic lupus erythematosus, unspecified: Secondary | ICD-10-CM | POA: Diagnosis not present

## 2017-09-11 DIAGNOSIS — R5383 Other fatigue: Secondary | ICD-10-CM | POA: Diagnosis not present

## 2017-09-11 DIAGNOSIS — Z6830 Body mass index (BMI) 30.0-30.9, adult: Secondary | ICD-10-CM | POA: Diagnosis not present

## 2017-09-25 DIAGNOSIS — I82499 Acute embolism and thrombosis of other specified deep vein of unspecified lower extremity: Secondary | ICD-10-CM | POA: Diagnosis not present

## 2017-10-08 DIAGNOSIS — D509 Iron deficiency anemia, unspecified: Secondary | ICD-10-CM | POA: Diagnosis not present

## 2017-10-09 DIAGNOSIS — I82503 Chronic embolism and thrombosis of unspecified deep veins of lower extremity, bilateral: Secondary | ICD-10-CM | POA: Diagnosis not present

## 2017-10-09 DIAGNOSIS — I10 Essential (primary) hypertension: Secondary | ICD-10-CM | POA: Diagnosis not present

## 2017-10-09 DIAGNOSIS — K219 Gastro-esophageal reflux disease without esophagitis: Secondary | ICD-10-CM | POA: Diagnosis not present

## 2017-10-09 DIAGNOSIS — M329 Systemic lupus erythematosus, unspecified: Secondary | ICD-10-CM | POA: Diagnosis not present

## 2017-10-09 DIAGNOSIS — Z6830 Body mass index (BMI) 30.0-30.9, adult: Secondary | ICD-10-CM | POA: Diagnosis not present

## 2017-10-14 DIAGNOSIS — H353111 Nonexudative age-related macular degeneration, right eye, early dry stage: Secondary | ICD-10-CM | POA: Diagnosis not present

## 2017-10-14 DIAGNOSIS — H5989 Other postprocedural complications and disorders of eye and adnexa, not elsewhere classified: Secondary | ICD-10-CM | POA: Diagnosis not present

## 2017-10-15 ENCOUNTER — Ambulatory Visit: Payer: Medicare HMO | Admitting: General Surgery

## 2017-10-15 ENCOUNTER — Encounter: Payer: Self-pay | Admitting: General Surgery

## 2017-10-15 VITALS — BP 140/72 | HR 63 | Temp 98.4°F | Ht 68.0 in | Wt 205.0 lb

## 2017-10-15 DIAGNOSIS — D509 Iron deficiency anemia, unspecified: Secondary | ICD-10-CM | POA: Diagnosis not present

## 2017-10-15 NOTE — H&P (Signed)
Jose Bush; [id number]; 1941-09-07   HPI Patient is a 77 year old white male who was referred to my care by Dr. Dimas AguasHoward for evaluation and treatment of iron deficiency anemia.  He has had a colonoscopy in 2017 which was unremarkable.  He is referred back for an upper endoscopy.  He is currently on iron supplements.  He does have a history of heartburn and cannot handle spicy foods.  He does not have any abdominal pain at the present time.  He denies any dysphasia.  No hematemesis has been noted.  He has other aches and pains but they are not related to his stomach. Past Medical History:  Diagnosis Date  . Arthritis   . Chest pain, unspecified   . DVT (deep venous thrombosis) (HCC)   . GERD (gastroesophageal reflux disease)   . Heart murmur    hx of   . History of hiatal hernia   . HTN (hypertension)   . Lupus   . Lupus   . Shingles    hx of   . Stroke [facility](HCC) 2010   'MILD" per pt-  weakness left side  . Tinnitus   . Ulcer of abdomen wall [facility](HCC)     Past Surgical History:  Procedure Laterality Date  . bilateral cataract surgery     . COLONOSCOPY N/A 09/18/2016   Procedure: COLONOSCOPY;  Surgeon: Franky MachoMark Tayler Heiden, MD;  Location: [facility];  Service: Gastroenterology;  Laterality: N/A;  . HERNIA REPAIR     2010/laparoscopic/right  . ivc filter     01/2013   . KNEE ARTHROSCOPY Right    2007  . TOTAL HIP ARTHROPLASTY Right 05/01/2017   Procedure: RIGHT TOTAL HIP ARTHROPLASTY ANTERIOR APPROACH;  Surgeon: Ollen GrossAluisio, Frank, MD;  Location: [facility];  Service: Orthopedics;  Laterality: Right;  . TOTAL KNEE ARTHROPLASTY Left 12/13/2014   Procedure: LEFT TOTAL KNEE ARTHROPLASTY;  Surgeon: Ollen GrossFrank Aluisio, MD;  Location: [facility];  Service: Orthopedics;  Laterality: Left;  . TOTAL KNEE ARTHROPLASTY Right 03/21/2015   Procedure: RIGHT TOTAL KNEE ARTHROPLASTY;  Surgeon: Ollen GrossFrank Aluisio, MD;  Location: [facility];  Service: Orthopedics;  Laterality: Right;    Family History  Problem Relation Age of Onset   . Hypertension Other   . Stroke Mother   . Colon cancer Neg Hx     Current Outpatient Medications on File Prior to Visit  Medication Sig Dispense Refill  . acetaminophen (TYLENOL) 500 MG tablet Take 1,000 mg by mouth every 6 (six) hours as needed for moderate pain or headache.    Marland Kitchen. aspirin EC 81 MG tablet Take 81 mg by mouth daily.    . Calcium Carb-Cholecalciferol (CALCIUM 600 + D PO) Take 1 tablet by mouth daily.    Marland Kitchen. docusate sodium (COLACE) 100 MG capsule Take 100 mg by mouth 2 (two) times daily as needed for mild constipation.    . enoxaparin (LOVENOX) 40 MG/0.4ML injection Inject 0.4 mLs (40 mg total) into the skin daily. Take daily for the next three days - Sat., Sun., and Monday. 3 Syringe 0  . lisinopril-hydrochlorothiazide (PRINZIDE,ZESTORETIC) 10-12.5 MG per tablet Take 1 tablet by mouth daily with breakfast.     . Menthol, Topical Analgesic, (ICY HOT EX) Apply 1 application topically 2 (two) times daily as needed (left knee.).     Marland Kitchen. Misc Natural Products (URINOZINC PO) Take 1 capsule by mouth daily.    . Multiple Vitamin (MULTIVITAMIN WITH MINERALS) TABS tablet Take 1 tablet by mouth daily. Centrum Silver    .  nitroGLYCERIN (NITROSTAT) 0.4 MG SL tablet Place 0.4 mg under the tongue every 5 (five) minutes as needed for chest pain. x3 doses as needed for chest pain    . Omega-3 Fatty Acids (FISH OIL) 1000 MG CPDR Take 1,000 mg by mouth daily.     Marland Kitchen omeprazole (PRILOSEC) 20 MG capsule Take 20 mg by mouth daily with breakfast.     . oxyCODONE (OXY IR/ROXICODONE) 5 MG immediate release tablet Take 1-2 tablets (5-10 mg total) by mouth every 4 (four) hours as needed for moderate pain or severe pain. 60 tablet 0  . tiZANidine (ZANAFLEX) 4 MG tablet Take 1 tablet (4 mg total) by mouth every 8 (eight) hours as needed for muscle spasms. 90 tablet 0  . traMADol (ULTRAM) 50 MG tablet Take 1-2 tablets (50-100 mg total) by mouth every 6 (six) hours as needed for moderate pain. 56 tablet 0  .  warfarin (COUMADIN) 5 MG tablet Take 1 tablet (5 mg total) by mouth every morning. Take Coumadin for three weeks for postoperative protocol and then the patient may resume their previous Coumadin home regimen.  The dose may need to be adjusted based upon the INR.  Please follow the INR and titrate Coumadin dose for a therapeutic range between 2.0 and 3.0 INR.  After completing the three weeks of Coumadin, the patient may resume their previous Coumadin home regimen. (Patient taking differently: Take 5 mg by mouth daily with breakfast. Take Coumadin for three weeks for postoperative protocol and then the patient may resume their previous Coumadin home regimen.  The dose may need to be adjusted based upon the INR.  Please follow the INR and titrate Coumadin dose for a therapeutic range between 2.0 and 3.0 INR.  After completing the three weeks of Coumadin, the patient may resume their previous Coumadin home regimen.) 35 tablet 0   No current facility-administered medications on file prior to visit.     No Known Allergies  Social History   Substance and Sexual Activity  Alcohol Use No    Social History   Tobacco Use  Smoking Status Former Smoker  . Packs/day: 0.50  . Years: 40.00  . Pack years: 20.00  . Types: Cigarettes  . Last attempt to quit: 10/02/1983  . Years since quitting: 34.0  Smokeless Tobacco Never Used    Review of Systems  Constitutional: Negative.   HENT: Negative.   Eyes: Negative.   Respiratory: Negative.   Cardiovascular: Negative.   Gastrointestinal: Negative.   Genitourinary: Negative.   Musculoskeletal: Positive for joint pain and neck pain.  Skin: Negative.   Neurological: Negative.   Endo/Heme/Allergies: Negative.   Psychiatric/Behavioral: Negative.     Objective   Vitals:   10/15/17 1321  BP: 140/72  Pulse: 63  Temp: 98.4 F (36.9 C)    Physical Exam  Constitutional: He is oriented to person, place, and time and well-developed, well-nourished, and  in no distress.  HENT:  Head: Normocephalic and atraumatic.  Cardiovascular: Normal rate, regular rhythm and normal heart sounds. Exam reveals no gallop and no friction rub.  No murmur heard. Pulmonary/Chest: Effort normal and breath sounds normal. No respiratory distress. He has no wheezes. He has no rales.  Abdominal: Soft. Bowel sounds are normal. He exhibits no distension. There is no tenderness. There is no rebound.  Neurological: He is alert and oriented to person, place, and time.  Skin: Skin is warm and dry.  Vitals reviewed.  Dr. Jeannette How notes reviewed. Assessment  Iron deficiency  anemia Plan   Patient is scheduled for an EGD with possible biopsy on 10/22/2017.  The risks and benefits of the procedure were fully explained to the patient, who gave informed consent.  He will stop his Coumadin 4 days prior to the procedure.

## 2017-10-15 NOTE — Patient Instructions (Signed)
Esophagogastroduodenoscopy °Esophagogastroduodenoscopy (EGD) is a procedure to examine the lining of the esophagus, stomach, and first part of the small intestine (duodenum). This procedure is done to check for problems such as inflammation, bleeding, ulcers, or growths. °During this procedure, a long, flexible, lighted tube with a camera attached (endoscope) is inserted down the throat. °Tell a health care provider about: °· Any allergies you have. °· All medicines you are taking, including vitamins, herbs, eye drops, creams, and over-the-counter medicines. °· Any problems you or family members have had with anesthetic medicines. °· Any blood disorders you have. °· Any surgeries you have had. °· Any medical conditions you have. °· Whether you are pregnant or may be pregnant. °What are the risks? °Generally, this is a safe procedure. However, problems may occur, including: °· Infection. °· Bleeding. °· A tear (perforation) in the esophagus, stomach, or duodenum. °· Trouble breathing. °· Excessive sweating. °· Spasms of the larynx. °· A slowed heartbeat. °· Low blood pressure. ° °What happens before the procedure? °· Follow instructions from your health care provider about eating or drinking restrictions. °· Ask your health care provider about: °? Changing or stopping your regular medicines. This is especially important if you are taking diabetes medicines or blood thinners. °? Taking medicines such as aspirin and ibuprofen. These medicines can thin your blood. Do not take these medicines before your procedure if your health care provider instructs you not to. °· Plan to have someone take you home after the procedure. °· If you wear dentures, be ready to remove them before the procedure. °What happens during the procedure? °· To reduce your risk of infection, your health care team will wash or sanitize their hands. °· An IV tube will be put in a vein in your hand or arm. You will get medicines and fluids through this  tube. °· You will be given one or more of the following: °? A medicine to help you relax (sedative). °? A medicine to numb the area (local anesthetic). This medicine may be sprayed into your throat. It will make you feel more comfortable and keep you from gagging or coughing during the procedure. °? A medicine for pain. °· A mouth guard may be placed in your mouth to protect your teeth and to keep you from biting on the endoscope. °· You will be asked to lie on your left side. °· The endoscope will be lowered down your throat into your esophagus, stomach, and duodenum. °· Air will be put into the endoscope. This will help your health care provider see better. °· The lining of your esophagus, stomach, and duodenum will be examined. °· Your health care provider may: °? Take a tissue sample so it can be looked at in a lab (biopsy). °? Remove growths. °? Remove objects (foreign bodies) that are stuck. °? Treat any bleeding with medicines or other devices that stop tissue from bleeding. °? Widen (dilate) or stretch narrowed areas of your esophagus and stomach. °· The endoscope will be taken out. °The procedure may vary among health care providers and hospitals. °What happens after the procedure? °· Your blood pressure, heart rate, breathing rate, and blood oxygen level will be monitored often until the medicines you were given have worn off. °· Do not eat or drink anything until the numbing medicine has worn off and your gag reflex has returned. °This information is not intended to replace advice given to you by your health care provider. Make sure you discuss any questions you   have with your health care provider. °Document Released: 01/18/2005 Document Revised: 02/23/2016 Document Reviewed: 08/11/2015 °Elsevier Interactive Patient Education © 2018 [employer] ° °

## 2017-10-15 NOTE — Progress Notes (Signed)
Jose Bush; [id number]; 10/16/1940   HPI Patient is a 76-year-old white male who was referred to my care by Dr. Howard for evaluation and treatment of iron deficiency anemia.  He has had a colonoscopy in 2017 which was unremarkable.  He is referred back for an upper endoscopy.  He is currently on iron supplements.  He does have a history of heartburn and cannot handle spicy foods.  He does not have any abdominal pain at the present time.  He denies any dysphasia.  No hematemesis has been noted.  He has other aches and pains but they are not related to his stomach. Past Medical History:  Diagnosis Date  . Arthritis   . Chest pain, unspecified   . DVT (deep venous thrombosis) (HCC)   . GERD (gastroesophageal reflux disease)   . Heart murmur    hx of   . History of hiatal hernia   . HTN (hypertension)   . Lupus   . Lupus   . Shingles    hx of   . Stroke (HCC) 2010   'MILD" per pt-  weakness left side  . Tinnitus   . Ulcer of abdomen wall (HCC)     Past Surgical History:  Procedure Laterality Date  . bilateral cataract surgery     . COLONOSCOPY N/A 09/18/2016   Procedure: COLONOSCOPY;  Surgeon: Carolyn Maniscalco, MD;  Location: [facility];  Service: Gastroenterology;  Laterality: N/A;  . HERNIA REPAIR     2010/laparoscopic/right  . ivc filter     01/2013   . KNEE ARTHROSCOPY Right    2007  . TOTAL HIP ARTHROPLASTY Right 05/01/2017   Procedure: RIGHT TOTAL HIP ARTHROPLASTY ANTERIOR APPROACH;  Surgeon: Aluisio, Frank, MD;  Location: [facility];  Service: Orthopedics;  Laterality: Right;  . TOTAL KNEE ARTHROPLASTY Left 12/13/2014   Procedure: LEFT TOTAL KNEE ARTHROPLASTY;  Surgeon: Frank Aluisio, MD;  Location: [facility];  Service: Orthopedics;  Laterality: Left;  . TOTAL KNEE ARTHROPLASTY Right 03/21/2015   Procedure: RIGHT TOTAL KNEE ARTHROPLASTY;  Surgeon: Frank Aluisio, MD;  Location: [facility];  Service: Orthopedics;  Laterality: Right;    Family History  Problem Relation Age of Onset   . Hypertension Other   . Stroke Mother   . Colon cancer Neg Hx     Current Outpatient Medications on File Prior to Visit  Medication Sig Dispense Refill  . acetaminophen (TYLENOL) 500 MG tablet Take 1,000 mg by mouth every 6 (six) hours as needed for moderate pain or headache.    . aspirin EC 81 MG tablet Take 81 mg by mouth daily.    . Calcium Carb-Cholecalciferol (CALCIUM 600 + D PO) Take 1 tablet by mouth daily.    . docusate sodium (COLACE) 100 MG capsule Take 100 mg by mouth 2 (two) times daily as needed for mild constipation.    . enoxaparin (LOVENOX) 40 MG/0.4ML injection Inject 0.4 mLs (40 mg total) into the skin daily. Take daily for the next three days - Sat., Sun., and Monday. 3 Syringe 0  . lisinopril-hydrochlorothiazide (PRINZIDE,ZESTORETIC) 10-12.5 MG per tablet Take 1 tablet by mouth daily with breakfast.     . Menthol, Topical Analgesic, (ICY HOT EX) Apply 1 application topically 2 (two) times daily as needed (left knee.).     . Misc Natural Products (URINOZINC PO) Take 1 capsule by mouth daily.    . Multiple Vitamin (MULTIVITAMIN WITH MINERALS) TABS tablet Take 1 tablet by mouth daily. Centrum Silver    .   nitroGLYCERIN (NITROSTAT) 0.4 MG SL tablet Place 0.4 mg under the tongue every 5 (five) minutes as needed for chest pain. x3 doses as needed for chest pain    . Omega-3 Fatty Acids (FISH OIL) 1000 MG CPDR Take 1,000 mg by mouth daily.     . omeprazole (PRILOSEC) 20 MG capsule Take 20 mg by mouth daily with breakfast.     . oxyCODONE (OXY IR/ROXICODONE) 5 MG immediate release tablet Take 1-2 tablets (5-10 mg total) by mouth every 4 (four) hours as needed for moderate pain or severe pain. 60 tablet 0  . tiZANidine (ZANAFLEX) 4 MG tablet Take 1 tablet (4 mg total) by mouth every 8 (eight) hours as needed for muscle spasms. 90 tablet 0  . traMADol (ULTRAM) 50 MG tablet Take 1-2 tablets (50-100 mg total) by mouth every 6 (six) hours as needed for moderate pain. 56 tablet 0  .  warfarin (COUMADIN) 5 MG tablet Take 1 tablet (5 mg total) by mouth every morning. Take Coumadin for three weeks for postoperative protocol and then the patient may resume their previous Coumadin home regimen.  The dose may need to be adjusted based upon the INR.  Please follow the INR and titrate Coumadin dose for a therapeutic range between 2.0 and 3.0 INR.  After completing the three weeks of Coumadin, the patient may resume their previous Coumadin home regimen. (Patient taking differently: Take 5 mg by mouth daily with breakfast. Take Coumadin for three weeks for postoperative protocol and then the patient may resume their previous Coumadin home regimen.  The dose may need to be adjusted based upon the INR.  Please follow the INR and titrate Coumadin dose for a therapeutic range between 2.0 and 3.0 INR.  After completing the three weeks of Coumadin, the patient may resume their previous Coumadin home regimen.) 35 tablet 0   No current facility-administered medications on file prior to visit.     No Known Allergies  Social History   Substance and Sexual Activity  Alcohol Use No    Social History   Tobacco Use  Smoking Status Former Smoker  . Packs/day: 0.50  . Years: 40.00  . Pack years: 20.00  . Types: Cigarettes  . Last attempt to quit: 10/02/1983  . Years since quitting: 34.0  Smokeless Tobacco Never Used    Review of Systems  Constitutional: Negative.   HENT: Negative.   Eyes: Negative.   Respiratory: Negative.   Cardiovascular: Negative.   Gastrointestinal: Negative.   Genitourinary: Negative.   Musculoskeletal: Positive for joint pain and neck pain.  Skin: Negative.   Neurological: Negative.   Endo/Heme/Allergies: Negative.   Psychiatric/Behavioral: Negative.     Objective   Vitals:   10/15/17 1321  BP: 140/72  Pulse: 63  Temp: 98.4 F (36.9 C)    Physical Exam  Constitutional: He is oriented to person, place, and time and well-developed, well-nourished, and  in no distress.  HENT:  Head: Normocephalic and atraumatic.  Cardiovascular: Normal rate, regular rhythm and normal heart sounds. Exam reveals no gallop and no friction rub.  No murmur heard. Pulmonary/Chest: Effort normal and breath sounds normal. No respiratory distress. He has no wheezes. He has no rales.  Abdominal: Soft. Bowel sounds are normal. He exhibits no distension. There is no tenderness. There is no rebound.  Neurological: He is alert and oriented to person, place, and time.  Skin: Skin is warm and dry.  Vitals reviewed.  Dr. Howard's notes reviewed. Assessment  Iron deficiency   anemia Plan   Patient is scheduled for an EGD with possible biopsy on 10/22/2017.  The risks and benefits of the procedure were fully explained to the patient, who gave informed consent.  He will stop his Coumadin 4 days prior to the procedure.  

## 2017-10-22 ENCOUNTER — Other Ambulatory Visit: Payer: Self-pay

## 2017-10-22 ENCOUNTER — Encounter (HOSPITAL_COMMUNITY): Payer: Self-pay | Admitting: *Deleted

## 2017-10-22 ENCOUNTER — Encounter (HOSPITAL_COMMUNITY)
Admission: RE | Disposition: A | Discharge status: Home Health | Payer: Self-pay | Source: Ambulatory Visit | Attending: General Surgery

## 2017-10-22 ENCOUNTER — Ambulatory Visit (HOSPITAL_COMMUNITY)
Admission: RE | Admit: 2017-10-22 | Discharge: 2017-10-22 | Disposition: A | Discharge status: Home Health | Payer: Medicare HMO | Source: Ambulatory Visit | Attending: General Surgery | Admitting: General Surgery

## 2017-10-22 DIAGNOSIS — Z79899 Other long term (current) drug therapy: Secondary | ICD-10-CM | POA: Diagnosis not present

## 2017-10-22 DIAGNOSIS — Z9842 Cataract extraction status, left eye: Secondary | ICD-10-CM | POA: Insufficient documentation

## 2017-10-22 DIAGNOSIS — I10 Essential (primary) hypertension: Secondary | ICD-10-CM | POA: Diagnosis not present

## 2017-10-22 DIAGNOSIS — Z8249 Family history of ischemic heart disease and other diseases of the circulatory system: Secondary | ICD-10-CM | POA: Insufficient documentation

## 2017-10-22 DIAGNOSIS — Z86718 Personal history of other venous thrombosis and embolism: Secondary | ICD-10-CM | POA: Diagnosis not present

## 2017-10-22 DIAGNOSIS — K2940 Chronic atrophic gastritis without bleeding: Secondary | ICD-10-CM | POA: Diagnosis not present

## 2017-10-22 DIAGNOSIS — K219 Gastro-esophageal reflux disease without esophagitis: Secondary | ICD-10-CM | POA: Diagnosis not present

## 2017-10-22 DIAGNOSIS — K3189 Other diseases of stomach and duodenum: Secondary | ICD-10-CM | POA: Insufficient documentation

## 2017-10-22 DIAGNOSIS — M329 Systemic lupus erythematosus, unspecified: Secondary | ICD-10-CM | POA: Insufficient documentation

## 2017-10-22 DIAGNOSIS — Z9841 Cataract extraction status, right eye: Secondary | ICD-10-CM | POA: Insufficient documentation

## 2017-10-22 DIAGNOSIS — Z7901 Long term (current) use of anticoagulants: Secondary | ICD-10-CM | POA: Insufficient documentation

## 2017-10-22 DIAGNOSIS — Z96653 Presence of artificial knee joint, bilateral: Secondary | ICD-10-CM | POA: Diagnosis not present

## 2017-10-22 DIAGNOSIS — Z8619 Personal history of other infectious and parasitic diseases: Secondary | ICD-10-CM | POA: Diagnosis not present

## 2017-10-22 DIAGNOSIS — Z87891 Personal history of nicotine dependence: Secondary | ICD-10-CM | POA: Insufficient documentation

## 2017-10-22 DIAGNOSIS — D509 Iron deficiency anemia, unspecified: Secondary | ICD-10-CM | POA: Diagnosis not present

## 2017-10-22 DIAGNOSIS — Z8673 Personal history of transient ischemic attack (TIA), and cerebral infarction without residual deficits: Secondary | ICD-10-CM | POA: Diagnosis not present

## 2017-10-22 DIAGNOSIS — Z7982 Long term (current) use of aspirin: Secondary | ICD-10-CM | POA: Insufficient documentation

## 2017-10-22 DIAGNOSIS — K449 Diaphragmatic hernia without obstruction or gangrene: Secondary | ICD-10-CM | POA: Diagnosis not present

## 2017-10-22 DIAGNOSIS — Z823 Family history of stroke: Secondary | ICD-10-CM | POA: Diagnosis not present

## 2017-10-22 HISTORY — PX: BIOPSY: SHX5522

## 2017-10-22 HISTORY — PX: ESOPHAGOGASTRODUODENOSCOPY: SHX5428

## 2017-10-22 SURGERY — EGD (ESOPHAGOGASTRODUODENOSCOPY)
Anesthesia: Moderate Sedation

## 2017-10-22 MED ORDER — BUTAMBEN-TETRACAINE-BENZOCAINE 2-2-14 % EX AERO
INHALATION_SPRAY | CUTANEOUS | Status: AC
  Filled 2017-10-22: qty 5

## 2017-10-22 MED ORDER — SODIUM CHLORIDE 0.9 % IV SOLN
INTRAVENOUS | Status: DC
  Administered 2017-10-22: 08:00:00 via INTRAVENOUS

## 2017-10-22 MED ORDER — MIDAZOLAM HCL 5 MG/5ML IJ SOLN
INTRAMUSCULAR | Status: AC
  Filled 2017-10-22: qty 5

## 2017-10-22 MED ORDER — MEPERIDINE HCL 100 MG/ML IJ SOLN
INTRAMUSCULAR | Status: AC
  Filled 2017-10-22: qty 1

## 2017-10-22 MED ORDER — BUTAMBEN-TETRACAINE-BENZOCAINE 2-2-14 % EX AERO
INHALATION_SPRAY | CUTANEOUS | Status: DC | PRN
  Administered 2017-10-22: 1 via TOPICAL

## 2017-10-22 MED ORDER — MIDAZOLAM HCL 5 MG/5ML IJ SOLN
INTRAMUSCULAR | Status: DC | PRN
  Administered 2017-10-22: 2 mg via INTRAVENOUS

## 2017-10-22 MED ORDER — MEPERIDINE HCL 50 MG/ML IJ SOLN
INTRAMUSCULAR | Status: DC | PRN
  Administered 2017-10-22: 50 mg via INTRAVENOUS

## 2017-10-22 NOTE — Op Note (Signed)
[facility] Patient Name: Ernestene KielWilliam Escher Procedure Date: 10/22/2017 7:35 AM MRN: [id number] Date of Birth: June 14, 1941 Attending MD: Franky MachoMark Trase Bunda , MD CSN: [id number] Age: 77 Admit Type: Outpatient Procedure:                Upper GI endoscopy Indications:              Iron deficiency anemia Providers:                Franky MachoMark Annelyse Rey, MD, Jannett CelestineAnitra Bell, RN, Dyann Ruddleonya Wilson Referring MD:              Medicines:                Midazolam 2 mg IV, Meperidine 50 mg IV, Cetacaine                            spray Complications:            No immediate complications. Estimated blood loss:                            None. Estimated Blood Loss:     Estimated blood loss: none. Procedure:                Pre-Anesthesia Assessment:                           - Prior to the procedure, a History and Physical                            was performed, and patient medications and                            allergies were reviewed. The patient is competent.                            The risks and benefits of the procedure and the                            sedation options and risks were discussed with the                            patient. All questions were answered and informed                            consent was obtained. Patient identification and                            proposed procedure were verified by the physician,                            the nurse and the technician in the procedure room.                            Mental Status Examination: alert and oriented.  Airway Examination: normal oropharyngeal airway and                            neck mobility. Respiratory Examination: clear to                            auscultation. CV Examination: RRR, no murmurs, no                            S3 or S4. Prophylactic Antibiotics: The patient                            does not require prophylactic antibiotics. Prior                            Anticoagulants: The  patient has taken Plavix                            (clopidogrel), last dose was 4 days prior to                            procedure. ASA Grade Assessment: III - A patient                            with severe systemic disease. After reviewing the                            risks and benefits, the patient was deemed in                            satisfactory condition to undergo the procedure.                            The anesthesia plan was to use moderate sedation /                            analgesia (conscious sedation). Immediately prior                            to administration of medications, the patient was                            re-assessed for adequacy to receive sedatives. The                            heart rate, respiratory rate, oxygen saturations,                            blood pressure, adequacy of pulmonary ventilation,                            and response to care were monitored throughout the  procedure. The physical status of the patient was                            re-assessed after the procedure.                           After obtaining informed consent, the endoscope was                            passed under direct vision. Throughout the                            procedure, the patient's blood pressure, pulse, and                            oxygen saturations were monitored continuously. The                            [id number] ([id number]) scope was introduced through the                            mouth, and advanced to the third part of duodenum.                            The upper GI endoscopy was accomplished with ease.                            The patient tolerated the procedure well. Scope In: 7:49:40 AM Scope Out: 7:52:50 AM Total Procedure Duration: 0 hours 3 minutes 10 seconds  Findings:      The examined duodenum was normal.      Diffuse atrophic mucosa was found in the gastric antrum. Biopsies were        taken with a cold forceps for Helicobacter pylori testing using CLOtest.       No ulcerations present. Easily distensible. Estimated blood loss: none.      The gastroesophageal junction was normal. Z line at 41cm from the teeth.       No inflammation, Barrett's, hiatal hernia. Impression:               - Normal examined duodenum.                           - Gastric mucosal atrophy. Biopsied.                           - Normal gastroesophageal junction. Moderate Sedation:      Moderate (conscious) sedation was administered by the endoscopy nurse       and supervised by the endoscopist. The following parameters were       monitored: oxygen saturation, heart rate, blood pressure, and response       to care. Recommendation:           - Patient has a contact number available for                            emergencies. The signs and  symptoms of potential                            delayed complications were discussed with the                            patient. Return to normal activities tomorrow.                            Written discharge instructions were provided to the                            patient.                           - Written discharge instructions were provided to                            the patient.                           - The signs and symptoms of potential delayed                            complications were discussed with the patient.                           - Patient has a contact number available for                            emergencies.                           - Return to normal activities tomorrow.                           - Resume previous diet.                           - Post-Procedure Resumption of Antiplatelet                            Medications: Restart Plavix (clopidogrel) tomorrow                            [Dose] PO daily.                           - Await pathology results. Procedure Code(s):        --- Professional ---                            [phone], Esophagogastroduodenoscopy, flexible,                            transoral; with biopsy, single or multiple Diagnosis Code(s):        --- Professional ---  D50.9, Iron deficiency anemia, unspecified                           K31.89, Other diseases of stomach and duodenum CPT copyright 2016 American Medical Association. All rights reserved. The codes documented in this report are preliminary and upon coder review may  be revised to meet current compliance requirements. Franky Macho, MD Franky Macho, MD 10/22/2017 8:00:59 AM This report has been signed electronically. Number of Addenda: 0

## 2017-10-22 NOTE — Discharge Instructions (Signed)
Esophagogastroduodenoscopy, Care After  Refer to this sheet in the next few weeks. These instructions provide you with information about caring for yourself after your procedure. Your health care provider may also give you more specific instructions. Your treatment has been planned according to current medical practices, but problems sometimes occur. Call your health care provider if you have any problems or questions after your procedure.  What can I expect after the procedure?  After the procedure, it is common to have:   A sore throat.   Nausea.   Bloating.   Dizziness.   Fatigue.    Follow these instructions at home:   Do not eat or drink anything until the numbing medicine (local anesthetic) has worn off and your gag reflex has returned. You will know that the local anesthetic has worn off when you can swallow comfortably.   Do not drive for 24 hours if you received a medicine to help you relax (sedative).   If your health care provider took a tissue sample for testing during the procedure, make sure to get your test results. This is your responsibility. Ask your health care provider or the department performing the test when your results will be ready.   Keep all follow-up visits as told by your health care provider. This is important.  Contact a health care provider if:   You cannot stop coughing.   You are not urinating.   You are urinating less than usual.  Get help right away if:   You have trouble swallowing.   You cannot eat or drink.   You have throat or chest pain that gets worse.   You are dizzy or light-headed.   You faint.   You have nausea or vomiting.   You have chills.   You have a fever.   You have severe abdominal pain.   You have black, tarry, or bloody stools.  This information is not intended to replace advice given to you by your health care provider. Make sure you discuss any questions you have with your health care provider.

## 2017-10-22 NOTE — Interval H&P Note (Signed)
History and Physical Interval Note:  10/22/2017 7:43 AM  Jose Bush  has presented today for surgery, with the diagnosis of anemia  The various methods of treatment have been discussed with the patient and family. After consideration of risks, benefits and other options for treatment, the patient has consented to  Procedure(s): ESOPHAGOGASTRODUODENOSCOPY (EGD) (N/A) BIOPSY (N/A) as a surgical intervention .  The patient's history has been reviewed, patient examined, no change in status, stable for surgery.  I have reviewed the patient's chart and labs.  Questions were answered to the patient's satisfaction.     Franky MachoMark Lydian Chavous

## 2017-10-23 LAB — CLOTEST (H. PYLORI), BIOPSY: Helicobacter screen: NEGATIVE

## 2017-10-24 DIAGNOSIS — M2550 Pain in unspecified joint: Secondary | ICD-10-CM | POA: Diagnosis not present

## 2017-10-24 DIAGNOSIS — E669 Obesity, unspecified: Secondary | ICD-10-CM | POA: Diagnosis not present

## 2017-10-24 DIAGNOSIS — M150 Primary generalized (osteo)arthritis: Secondary | ICD-10-CM | POA: Diagnosis not present

## 2017-10-24 DIAGNOSIS — I82499 Acute embolism and thrombosis of other specified deep vein of unspecified lower extremity: Secondary | ICD-10-CM | POA: Diagnosis not present

## 2017-10-24 DIAGNOSIS — Z6831 Body mass index (BMI) 31.0-31.9, adult: Secondary | ICD-10-CM | POA: Diagnosis not present

## 2017-10-24 DIAGNOSIS — L723 Sebaceous cyst: Secondary | ICD-10-CM | POA: Diagnosis not present

## 2017-10-24 DIAGNOSIS — M329 Systemic lupus erythematosus, unspecified: Secondary | ICD-10-CM | POA: Diagnosis not present

## 2017-10-24 DIAGNOSIS — R5382 Chronic fatigue, unspecified: Secondary | ICD-10-CM | POA: Diagnosis not present

## 2017-10-24 DIAGNOSIS — D6861 Antiphospholipid syndrome: Secondary | ICD-10-CM | POA: Diagnosis not present

## 2017-10-28 ENCOUNTER — Encounter (HOSPITAL_COMMUNITY): Payer: Self-pay | Admitting: General Surgery

## 2017-10-28 ENCOUNTER — Telehealth: Payer: Self-pay | Admitting: Gastroenterology

## 2017-10-28 NOTE — Telephone Encounter (Signed)
Dr. Lovell SheehanJenkins has seen patient and completed endoscopic procedures. He was referred to Dr. Lovell SheehanJenkins by Dr. Dimas AguasHoward secondary to IDA. Dr. Lovell SheehanJenkins would like him to be seen with [facility] to discuss possible capsule study. Please arrange new patient appointment. Thanks!

## 2017-10-30 ENCOUNTER — Encounter: Payer: Self-pay | Admitting: Gastroenterology

## 2017-10-30 DIAGNOSIS — Z96641 Presence of right artificial hip joint: Secondary | ICD-10-CM | POA: Insufficient documentation

## 2017-10-30 NOTE — Telephone Encounter (Signed)
OV made 12/25/2017 at 830 am with AB and letter mailed

## 2017-10-31 DIAGNOSIS — Z471 Aftercare following joint replacement surgery: Secondary | ICD-10-CM | POA: Diagnosis not present

## 2017-10-31 DIAGNOSIS — Z96641 Presence of right artificial hip joint: Secondary | ICD-10-CM | POA: Diagnosis not present

## 2017-10-31 DIAGNOSIS — M1711 Unilateral primary osteoarthritis, right knee: Secondary | ICD-10-CM | POA: Diagnosis not present

## 2017-11-06 DIAGNOSIS — I82503 Chronic embolism and thrombosis of unspecified deep veins of lower extremity, bilateral: Secondary | ICD-10-CM | POA: Diagnosis not present

## 2017-11-22 DIAGNOSIS — E669 Obesity, unspecified: Secondary | ICD-10-CM | POA: Diagnosis not present

## 2017-11-22 DIAGNOSIS — M2550 Pain in unspecified joint: Secondary | ICD-10-CM | POA: Diagnosis not present

## 2017-11-22 DIAGNOSIS — D6861 Antiphospholipid syndrome: Secondary | ICD-10-CM | POA: Diagnosis not present

## 2017-11-22 DIAGNOSIS — R5382 Chronic fatigue, unspecified: Secondary | ICD-10-CM | POA: Diagnosis not present

## 2017-11-22 DIAGNOSIS — M150 Primary generalized (osteo)arthritis: Secondary | ICD-10-CM | POA: Diagnosis not present

## 2017-11-22 DIAGNOSIS — M329 Systemic lupus erythematosus, unspecified: Secondary | ICD-10-CM | POA: Diagnosis not present

## 2017-11-22 DIAGNOSIS — Z6831 Body mass index (BMI) 31.0-31.9, adult: Secondary | ICD-10-CM | POA: Diagnosis not present

## 2017-11-27 DIAGNOSIS — I82503 Chronic embolism and thrombosis of unspecified deep veins of lower extremity, bilateral: Secondary | ICD-10-CM | POA: Diagnosis not present

## 2017-11-27 DIAGNOSIS — G2581 Restless legs syndrome: Secondary | ICD-10-CM | POA: Diagnosis not present

## 2017-11-27 DIAGNOSIS — M722 Plantar fascial fibromatosis: Secondary | ICD-10-CM | POA: Diagnosis not present

## 2017-11-27 DIAGNOSIS — Z6830 Body mass index (BMI) 30.0-30.9, adult: Secondary | ICD-10-CM | POA: Diagnosis not present

## 2017-12-10 DIAGNOSIS — I82499 Acute embolism and thrombosis of other specified deep vein of unspecified lower extremity: Secondary | ICD-10-CM | POA: Diagnosis not present

## 2017-12-17 DIAGNOSIS — Z6831 Body mass index (BMI) 31.0-31.9, adult: Secondary | ICD-10-CM | POA: Diagnosis not present

## 2017-12-17 DIAGNOSIS — I82503 Chronic embolism and thrombosis of unspecified deep veins of lower extremity, bilateral: Secondary | ICD-10-CM | POA: Diagnosis not present

## 2017-12-17 DIAGNOSIS — M722 Plantar fascial fibromatosis: Secondary | ICD-10-CM | POA: Diagnosis not present

## 2017-12-25 ENCOUNTER — Telehealth: Payer: Self-pay | Admitting: *Deleted

## 2017-12-25 ENCOUNTER — Encounter: Payer: Self-pay | Admitting: *Deleted

## 2017-12-25 ENCOUNTER — Encounter: Payer: Self-pay | Admitting: Gastroenterology

## 2017-12-25 ENCOUNTER — Other Ambulatory Visit: Payer: Self-pay | Admitting: *Deleted

## 2017-12-25 ENCOUNTER — Ambulatory Visit: Payer: Medicare HMO | Admitting: Gastroenterology

## 2017-12-25 VITALS — BP 139/70 | HR 61 | Temp 96.8°F | Ht 68.0 in | Wt 205.2 lb

## 2017-12-25 DIAGNOSIS — D508 Other iron deficiency anemias: Secondary | ICD-10-CM

## 2017-12-25 NOTE — Patient Instructions (Signed)
We have scheduled you for a capsule study and are requesting labs.  Further recommendations to follow!  It was a pleasure to see you today. I strive to create trusting relationships with patients to provide genuine, compassionate, and quality care. I value your feedback. If you receive a survey regarding your visit,  I greatly appreciate you taking time to fill this out.   Gelene MinkAnna W. Sita Mangen, PhD, ANP-BC [facility]

## 2017-12-25 NOTE — Progress Notes (Signed)
Primary Care Physician:  Selinda Flavin, MD Primary Gastroenterologist:  Dr. Jena Gauss   Chief Complaint  Patient presents with  . Anemia    referred for capsule study    HPI:   Jose Bush is a 77 y.o. male presenting today at the request of Dr. Lovell Sheehan due to IDA. He had been referred to Dr. Lovell Sheehan by Dr. Dimas Aguas for IDA. Colonoscopy in Dec 2017 normal. EGD then completed due to IDA in Jan 2019 with normal duodenum, gastric mucosal atrophy with negative H.pylori, normal GE junction. Labs in epic with Hgb 11.1 in Aug 2018. I do not have outside labs at time of visit.   Took iron for 30 days and then stopped, around November. States he was told he had IDA in Nov 2018.  No melena, or hematochezia. No abdominal pain. No loss of weight, loss of appetite. Used to have issues with dysphagia but Prilosec resolved this. On a prednisone taper now but not taking long-term. No NSAIDs, aspirin tylenol.    Past Medical History:  Diagnosis Date  . Arthritis   . Chest pain, unspecified   . DVT (deep venous thrombosis) (HCC)   . GERD (gastroesophageal reflux disease)   . Heart murmur    hx of   . History of hiatal hernia   . HTN (hypertension)   . Lupus   . Lupus   . Shingles    hx of   . Stroke [facility]) 2010   'MILD" per pt-  weakness left side  . Tinnitus   . Ulcer of abdomen wall [facility])     Past Surgical History:  Procedure Laterality Date  . bilateral cataract surgery     . BIOPSY N/A 10/22/2017   Procedure: BIOPSY;  Surgeon: Franky Macho, MD;  [facility];  Service: Gastroenterology;  Laterality: N/A;  . COLONOSCOPY N/A 09/18/2016   Dr. Lovell Sheehan: normal  . ESOPHAGOGASTRODUODENOSCOPY N/A 10/22/2017   Dr. Lovell Sheehan: normal duodenum, gastric mucosal atrophy but negative H.pylori (Clotest negative), normal GE junction  . HERNIA REPAIR     2010/laparoscopic/right  . ivc filter     01/2013   . KNEE ARTHROSCOPY Right    2007  . TOTAL HIP ARTHROPLASTY Right 05/01/2017   Procedure: RIGHT TOTAL HIP ARTHROPLASTY ANTERIOR APPROACH;  Surgeon: Ollen Gross, MD;  Location: [facility];  Service: Orthopedics;  Laterality: Right;  . TOTAL KNEE ARTHROPLASTY Left 12/13/2014   Procedure: LEFT TOTAL KNEE ARTHROPLASTY;  Surgeon: Ollen Gross, MD;  Location: [facility];  Service: Orthopedics;  Laterality: Left;  . TOTAL KNEE ARTHROPLASTY Right 03/21/2015   Procedure: RIGHT TOTAL KNEE ARTHROPLASTY;  Surgeon: Ollen Gross, MD;  [facility];  Service: Orthopedics;  Laterality: Right;    Current Outpatient Medications  Medication Sig Dispense Refill  . acetaminophen (TYLENOL) 500 MG tablet Take 1,000 mg by mouth every 6 (six) hours as needed for moderate pain or headache.    Marland Kitchen Bioflavonoid Products (ESTER C PO) Take 500 mg by mouth daily.    Marland Kitchen docusate sodium (COLACE) 100 MG capsule Take 100 mg by mouth daily.     . hydroxychloroquine (PLAQUENIL) 200 MG tablet Take 200 mg by mouth 2 (two) times daily.    Marland Kitchen lisinopril-hydrochlorothiazide (PRINZIDE,ZESTORETIC) 10-12.5 MG per tablet Take 1 tablet by mouth daily with breakfast.     . Menthol, Topical Analgesic, (ICY HOT EX) Apply 1 application topically 2 (two) times daily as needed (left knee.).     Marland Kitchen Misc Natural Products (URINOZINC PO)  Take 1 capsule by mouth daily.    . Multiple Vitamin (MULTIVITAMIN WITH MINERALS) TABS tablet Take 1 tablet by mouth daily. Centrum Silver    . nitroGLYCERIN (NITROSTAT) 0.4 MG SL tablet Place 0.4 mg under the tongue every 5 (five) minutes as needed for chest pain. x3 doses as needed for chest pain    . OMEGA-3 KRILL OIL PO Take 1 capsule by mouth daily.    Marland Kitchen omeprazole (PRILOSEC) 20 MG capsule Take 20 mg by mouth daily.    . predniSONE (DELTASONE) 5 MG tablet Take 5 mg by mouth daily. Taper dose    . warfarin (COUMADIN) 5 MG tablet Take 1 tablet (5 mg total) by mouth every morning. Take Coumadin for three weeks for postoperative protocol and then the patient may resume their previous Coumadin home  regimen.  The dose may need to be adjusted based upon the INR.  Please follow the INR and titrate Coumadin dose for a therapeutic range between 2.0 and 3.0 INR.  After completing the three weeks of Coumadin, the patient may resume their previous Coumadin home regimen. (Patient taking differently: Take 5 mg by mouth daily with breakfast. ) 35 tablet 0   No current facility-administered medications for this visit.     Allergies as of 12/25/2017  . (No Known Allergies)    Family History  Problem Relation Age of Onset  . Hypertension Other   . Stroke Mother   . Colon cancer Neg Hx     Social History   Socioeconomic History  . Marital status: Married    Spouse name: Not on file  . Number of children: Not on file  . Years of education: Not on file  . Highest education level: Not on file  Occupational History  . Not on file  Social Needs  . Financial resource strain: Not on file  . Food insecurity:    Worry: Not on file    Inability: Not on file  . Transportation needs:    Medical: Not on file    Non-medical: Not on file  Tobacco Use  . Smoking status: Former Smoker    Packs/day: 0.50    Years: 40.00    Pack years: 20.00    Types: Cigarettes    Last attempt to quit: 10/02/1983    Years since quitting: 34.2  . Smokeless tobacco: Never Used  Substance and Sexual Activity  . Alcohol use: No  . Drug use: No  . Sexual activity: Not on file  Lifestyle  . Physical activity:    Days per week: Not on file    Minutes per session: Not on file  . Stress: Not on file  Relationships  . Social connections:    Talks on phone: Not on file    Gets together: Not on file    Attends religious service: Not on file    Active member of club or organization: Not on file    Attends meetings of clubs or organizations: Not on file    Relationship status: Not on file  . Intimate partner violence:    Fear of current or ex partner: Not on file    Emotionally abused: Not on file    Physically  abused: Not on file    Forced sexual activity: Not on file  Other Topics Concern  . Not on file  Social History Narrative  . Not on file    Review of Systems: Gen: Denies any fever, chills, fatigue, weight loss, lack of appetite.  CV: Denies chest pain, heart palpitations, peripheral edema, syncope.  Resp: Denies shortness of breath at rest or with exertion. Denies wheezing or cough.  GI: see HPI  GU : Denies urinary burning, urinary frequency, urinary hesitancy MS: +joint pain, hip pain  Derm: Denies rash, itching, dry skin Psych: Denies depression, anxiety, memory loss, and confusion Heme: Denies bruising, bleeding, and enlarged lymph nodes.  Physical Exam: BP 139/70   Pulse 61   Temp (!) 96.8 F (36 C) (Oral)   Ht 5\' 8"  (1.727 m)   Wt 205 lb 3.2 oz (93.1 kg)   BMI 31.20 kg/m  General:   Alert and oriented. Pleasant and cooperative. Well-nourished and well-developed.  Head:  Normocephalic and atraumatic. Eyes:  Without icterus, sclera clear and conjunctiva pink.  Ears:  Normal auditory acuity. Nose:  No deformity, discharge,  or lesions. Mouth:  No deformity or lesions, oral mucosa pink.  Lungs:  Clear to auscultation bilaterally. No wheezes, rales, or rhonchi. No distress.  Heart:  S1, S2 present without murmurs appreciated.  Abdomen:  +BS, soft, non-tender and non-distended. No HSM noted. No guarding or rebound. No masses appreciated.  Rectal:  Deferred  Msk:  Symmetrical without gross deformities. Normal posture. Pulses:  Normal pulses noted. Extremities:  Without clubbing or edema. Neurologic:  Alert and  oriented x4;  grossly normal neurologically. Skin:  Intact without significant lesions or rashes. Psych:  Alert and cooperative. Normal mood and affect.

## 2017-12-25 NOTE — Assessment & Plan Note (Signed)
77 year old male originally referred by Dr. Dimas AguasHoward to Dr. Lovell SheehanJenkins due to IDA. Colonoscopy on file from 2017 normal. EGD recently completed this year overall unrevealing and negative H.pylori. Dr. Lovell SheehanJenkins has requested further evaluation to ensure no source in small bowel as culprit. Remains on Prilosec due to chronic GERD. Will soon be completing prednisone taper, otherwise he denies any chronic NSAIDs, aspirin powders. No overt GI bleeding. I do not have any outside labs to review today, but I am requesting this. Proceed with capsule study to wrap up GI evaluation. As he is on chronic Coumadin, could have occult small bowel blood loss due to AVMs, erosions, etc. less likely malignancy.

## 2017-12-25 NOTE — Telephone Encounter (Signed)
Checked [employer]'s website and no PA is required for givens capsule study

## 2017-12-25 NOTE — Progress Notes (Signed)
cc'ed to pcp °

## 2017-12-30 ENCOUNTER — Telehealth: Payer: Self-pay | Admitting: *Deleted

## 2017-12-30 NOTE — Telephone Encounter (Signed)
Received call from preservice center stating the insurance is requiring PA for GIVENS Capsule even though online is showing no PA is needed. I called [employer] and spoke with intake rep and was advise PA is needed. This was sent into review. Ref # [id number]. Awaiting approval.   Called spoke with spouse and is aware we will need to r/s GIVENS.

## 2017-12-30 NOTE — Telephone Encounter (Signed)
New appt for Jose Bush is scheduled for 01/27/18 at 7:30am. New instructions mailed. Jose Bush is aware. Still awaiting PA approval

## 2017-12-31 DIAGNOSIS — Z6831 Body mass index (BMI) 31.0-31.9, adult: Secondary | ICD-10-CM | POA: Diagnosis not present

## 2017-12-31 DIAGNOSIS — I10 Essential (primary) hypertension: Secondary | ICD-10-CM | POA: Diagnosis not present

## 2017-12-31 DIAGNOSIS — Z0001 Encounter for general adult medical examination with abnormal findings: Secondary | ICD-10-CM | POA: Diagnosis not present

## 2017-12-31 DIAGNOSIS — M722 Plantar fascial fibromatosis: Secondary | ICD-10-CM | POA: Diagnosis not present

## 2018-01-01 NOTE — Telephone Encounter (Signed)
[facility] is still pending review.

## 2018-01-02 DIAGNOSIS — D6861 Antiphospholipid syndrome: Secondary | ICD-10-CM | POA: Diagnosis not present

## 2018-01-02 DIAGNOSIS — M150 Primary generalized (osteo)arthritis: Secondary | ICD-10-CM | POA: Diagnosis not present

## 2018-01-02 DIAGNOSIS — M2550 Pain in unspecified joint: Secondary | ICD-10-CM | POA: Diagnosis not present

## 2018-01-02 DIAGNOSIS — E669 Obesity, unspecified: Secondary | ICD-10-CM | POA: Diagnosis not present

## 2018-01-02 DIAGNOSIS — Z6831 Body mass index (BMI) 31.0-31.9, adult: Secondary | ICD-10-CM | POA: Diagnosis not present

## 2018-01-02 DIAGNOSIS — R5382 Chronic fatigue, unspecified: Secondary | ICD-10-CM | POA: Diagnosis not present

## 2018-01-02 DIAGNOSIS — M329 Systemic lupus erythematosus, unspecified: Secondary | ICD-10-CM | POA: Diagnosis not present

## 2018-01-06 NOTE — Telephone Encounter (Signed)
[facility]

## 2018-01-08 NOTE — Telephone Encounter (Signed)
[place]IV[place] was approved 12/31/17-01/29/18. Auth # [id number]

## 2018-01-09 DIAGNOSIS — M329 Systemic lupus erythematosus, unspecified: Secondary | ICD-10-CM | POA: Diagnosis not present

## 2018-01-09 DIAGNOSIS — I82499 Acute embolism and thrombosis of other specified deep vein of unspecified lower extremity: Secondary | ICD-10-CM | POA: Diagnosis not present

## 2018-01-09 DIAGNOSIS — I10 Essential (primary) hypertension: Secondary | ICD-10-CM | POA: Diagnosis not present

## 2018-01-09 DIAGNOSIS — K219 Gastro-esophageal reflux disease without esophagitis: Secondary | ICD-10-CM | POA: Diagnosis not present

## 2018-01-09 DIAGNOSIS — R5383 Other fatigue: Secondary | ICD-10-CM | POA: Diagnosis not present

## 2018-01-09 DIAGNOSIS — D509 Iron deficiency anemia, unspecified: Secondary | ICD-10-CM | POA: Diagnosis not present

## 2018-01-09 DIAGNOSIS — G2581 Restless legs syndrome: Secondary | ICD-10-CM | POA: Diagnosis not present

## 2018-01-13 DIAGNOSIS — D509 Iron deficiency anemia, unspecified: Secondary | ICD-10-CM | POA: Diagnosis not present

## 2018-01-13 DIAGNOSIS — I82499 Acute embolism and thrombosis of other specified deep vein of unspecified lower extremity: Secondary | ICD-10-CM | POA: Diagnosis not present

## 2018-01-13 DIAGNOSIS — Z1331 Encounter for screening for depression: Secondary | ICD-10-CM | POA: Diagnosis not present

## 2018-01-13 DIAGNOSIS — K219 Gastro-esophageal reflux disease without esophagitis: Secondary | ICD-10-CM | POA: Diagnosis not present

## 2018-01-13 DIAGNOSIS — Z1389 Encounter for screening for other disorder: Secondary | ICD-10-CM | POA: Diagnosis not present

## 2018-01-13 DIAGNOSIS — Z6831 Body mass index (BMI) 31.0-31.9, adult: Secondary | ICD-10-CM | POA: Diagnosis not present

## 2018-01-13 DIAGNOSIS — I10 Essential (primary) hypertension: Secondary | ICD-10-CM | POA: Diagnosis not present

## 2018-01-13 DIAGNOSIS — I639 Cerebral infarction, unspecified: Secondary | ICD-10-CM | POA: Diagnosis not present

## 2018-01-20 ENCOUNTER — Telehealth: Payer: Self-pay | Admitting: Gastroenterology

## 2018-01-20 NOTE — Telephone Encounter (Signed)
[facility] outside [facility] He has history of IDA. Was referred to Dr. Lovell SheehanJenkins for EGD, as colonoscopy had been fairly recent. All I have is Hgb. Need iron studies. Dr. Lovell SheehanJenkins referred to us to wrap up evaluation with capsule.

## 2018-01-21 NOTE — Telephone Encounter (Signed)
I have requested this again from the PCP

## 2018-01-21 NOTE — Telephone Encounter (Signed)
The only labs they sent are from Oct 2018. There are labs on file from Aug 2018, so I know there are others.   Oct 2018: Hgb 11.9, which is better from August 2018 which was 11.1  Are there ANY iron studies from PCP? His renal function is normal. IF he has not had any iron studies, I will need to order this.

## 2018-01-21 NOTE — Telephone Encounter (Signed)
I have requested labs again from the PCP. I am asking for ALL labs from Aug 2018 to the present and any iron studies.

## 2018-01-23 ENCOUNTER — Other Ambulatory Visit: Payer: Self-pay | Admitting: Orthopedic Surgery

## 2018-01-23 ENCOUNTER — Other Ambulatory Visit (INDEPENDENT_AMBULATORY_CARE_PROVIDER_SITE_OTHER): Payer: Medicare HMO

## 2018-01-23 DIAGNOSIS — Z471 Aftercare following joint replacement surgery: Secondary | ICD-10-CM | POA: Diagnosis not present

## 2018-01-23 DIAGNOSIS — Z96641 Presence of right artificial hip joint: Secondary | ICD-10-CM | POA: Diagnosis not present

## 2018-01-23 DIAGNOSIS — R52 Pain, unspecified: Secondary | ICD-10-CM

## 2018-01-23 DIAGNOSIS — M25551 Pain in right hip: Secondary | ICD-10-CM

## 2018-01-27 ENCOUNTER — Ambulatory Visit (HOSPITAL_COMMUNITY)
Admission: RE | Admit: 2018-01-27 | Discharge: 2018-01-27 | Disposition: A | Discharge status: Home Health | Payer: Medicare HMO | Source: Ambulatory Visit | Attending: Internal Medicine | Admitting: Internal Medicine

## 2018-01-27 ENCOUNTER — Encounter (HOSPITAL_COMMUNITY)
Admission: RE | Disposition: A | Discharge status: Home Health | Payer: Self-pay | Source: Ambulatory Visit | Attending: Internal Medicine

## 2018-01-27 DIAGNOSIS — Z79899 Other long term (current) drug therapy: Secondary | ICD-10-CM | POA: Insufficient documentation

## 2018-01-27 DIAGNOSIS — Z7901 Long term (current) use of anticoagulants: Secondary | ICD-10-CM | POA: Diagnosis not present

## 2018-01-27 DIAGNOSIS — D509 Iron deficiency anemia, unspecified: Secondary | ICD-10-CM | POA: Insufficient documentation

## 2018-01-27 DIAGNOSIS — K219 Gastro-esophageal reflux disease without esophagitis: Secondary | ICD-10-CM | POA: Diagnosis not present

## 2018-01-27 HISTORY — PX: GIVENS CAPSULE STUDY: SHX5432

## 2018-01-27 SURGERY — IMAGING PROCEDURE, GI TRACT, INTRALUMINAL, VIA CAPSULE

## 2018-01-28 NOTE — Telephone Encounter (Signed)
Oct 2018 Hgb 11.9, normocytic anemia.  Dec 2018: Hgb 11.4, normocytic anemia. Creatinine normal. Ferritin 34. Iron low at 30. B12 normal at 1065, folic acid greater than 20.   Jan 2019: Hgb up to 12.9, Ferritin 88 April 2019: Hgb 13.6, Ferritin 77

## 2018-01-29 ENCOUNTER — Encounter (HOSPITAL_COMMUNITY): Payer: Self-pay | Admitting: Internal Medicine

## 2018-01-30 NOTE — Op Note (Addendum)
Small Bowel Givens Capsule Study Procedure date:  01/27/18  Referring Provider:  Lewie Loron, NP/Dr. Jena Gauss  PCP:  Dr. Selinda Flavin, MD  Indication for procedure:   77 year old male originally referred by Dr. Dimas Aguas to Dr. Lovell Sheehan due to IDA. Colonoscopy on file from 2017 normal. EGD recently completed this year overall unrevealing and negative H.pylori. Dr. Lovell Sheehan has requested further evaluation to ensure no source in small bowel as culprit. Remains on Prilosec due to chronic GERD.  No overt GI bleeding, and he is on chronic Coumadin. Dec 2018 ferritin low normal at 34 and iron low at 30. Hgb 11.4 in Dec 2018. With supplementation, Hgb most recently up to 13.6, ferritin 77.    Findings:   Capsule is complete to the cecum. Query possible distal esophagitis, to review final image in near future. Scattered gastric erosions but no obvious ulcerations appreciated. In most proximal duodenum (00:20:37), there were just a few images that were not consistent with fresh blood and uncertain significance, no obvious source. As this was only on limited images and without signs of older blood downstream, doubt this was a significant finding. There were several images in distal small bowel at 2:07:37-2:07:39 that showed a solitary red "speck" that was non-specific and unable to identify any obvious lesion. There were no masses, ulcerations, or obvious concerning features in the small bowel. Lymphangiectasia noted in distal small bowel on several images but not clinically significant.   First Gastric image:  00:01:06 First Duodenal image: 00:20:36 First Cecal image: 03:03:00 Gastric Passage time: 0h 92m Small Bowel Passage time:  2h 60m  Summary & Recommendations: 78 year old male with new onset IDA noted by PCP in the fall of 2018, completing colonoscopy in 2017 and most recently EGD this year both by Dr. Lovell Sheehan. Capsule study without concerning mass, lesions, but he did have scattered gastric erosions. No  obvious sources in small bowel to contribute to IDA. As he is on Coumadin, could have occult blood loss anywhere in GI tract but he has no overt GI bleeding and has responded well to oral iron. PCP is following IDA closely. Would recommend continued PPI due to chronic GERD, avoid NSAIDs if possible, and if any overt GI bleeding further endoscopic evaluation as appropriate. At this point, colonoscopy is fairly recent but would recommend early interval colonoscopy if worsening IDA or overt GI bleeding.   Jose Mink, PhD, ANP-BC [facility]    Attending note:  Pertinent images reviewed. Mild changes of reflux esophagitis. Agree with above summary and recommendations.

## 2018-01-31 ENCOUNTER — Telehealth: Payer: Self-pay | Admitting: Gastroenterology

## 2018-01-31 NOTE — Telephone Encounter (Signed)
Attempted to call patient regarding capsule findings. Left message. Will try again on Monday.

## 2018-02-03 NOTE — Telephone Encounter (Signed)
Noted  

## 2018-02-03 NOTE — Telephone Encounter (Signed)
77 year old male with new onset IDA noted by PCP in the fall of 2018, completing colonoscopy in 2017 and most recently EGD this year both by Dr. Lovell Sheehan. Capsule study without concerning mass, lesions, but he did have scattered gastric erosions. No obvious sources in small bowel to contribute to IDA. As he is on Coumadin, could have occult blood loss anywhere in GI tract but he has no overt GI bleeding and has responded well to oral iron. PCP is following IDA closely. Would recommend continued PPI due to chronic GERD, avoid NSAIDs if possible, and if any overt GI bleeding further endoscopic evaluation as appropriate. At this point, colonoscopy is fairly recent but would recommend early interval colonoscopy if worsening IDA or overt GI bleeding.   I attempted to call patient and again had to leave message. If he returns call while I am out of the office later this week, please let him know.

## 2018-02-03 NOTE — Telephone Encounter (Signed)
I notified patient.

## 2018-02-07 DIAGNOSIS — Z6831 Body mass index (BMI) 31.0-31.9, adult: Secondary | ICD-10-CM | POA: Diagnosis not present

## 2018-02-07 DIAGNOSIS — L03031 Cellulitis of right toe: Secondary | ICD-10-CM | POA: Diagnosis not present

## 2018-02-10 DIAGNOSIS — I82499 Acute embolism and thrombosis of other specified deep vein of unspecified lower extremity: Secondary | ICD-10-CM | POA: Diagnosis not present

## 2018-02-12 DIAGNOSIS — I779 Disorder of arteries and arterioles, unspecified: Secondary | ICD-10-CM | POA: Diagnosis not present

## 2018-02-12 DIAGNOSIS — M7989 Other specified soft tissue disorders: Secondary | ICD-10-CM | POA: Diagnosis not present

## 2018-02-13 ENCOUNTER — Encounter: Payer: Self-pay | Admitting: Vascular Surgery

## 2018-02-13 ENCOUNTER — Encounter: Payer: Self-pay | Admitting: *Deleted

## 2018-02-13 ENCOUNTER — Other Ambulatory Visit: Payer: Self-pay | Admitting: *Deleted

## 2018-02-13 ENCOUNTER — Other Ambulatory Visit: Payer: Self-pay

## 2018-02-13 ENCOUNTER — Ambulatory Visit: Payer: Medicare HMO | Admitting: Vascular Surgery

## 2018-02-13 VITALS — BP 125/70 | HR 65 | Temp 97.0°F | Resp 16 | Ht 68.0 in | Wt 209.0 lb

## 2018-02-13 DIAGNOSIS — L03031 Cellulitis of right toe: Secondary | ICD-10-CM | POA: Diagnosis not present

## 2018-02-13 DIAGNOSIS — Z6831 Body mass index (BMI) 31.0-31.9, adult: Secondary | ICD-10-CM | POA: Diagnosis not present

## 2018-02-13 DIAGNOSIS — I739 Peripheral vascular disease, unspecified: Secondary | ICD-10-CM

## 2018-02-13 DIAGNOSIS — I82499 Acute embolism and thrombosis of other specified deep vein of unspecified lower extremity: Secondary | ICD-10-CM | POA: Diagnosis not present

## 2018-02-13 DIAGNOSIS — I82503 Chronic embolism and thrombosis of unspecified deep veins of lower extremity, bilateral: Secondary | ICD-10-CM | POA: Diagnosis not present

## 2018-02-13 DIAGNOSIS — M329 Systemic lupus erythematosus, unspecified: Secondary | ICD-10-CM | POA: Diagnosis not present

## 2018-02-13 DIAGNOSIS — D6869 Other thrombophilia: Secondary | ICD-10-CM | POA: Diagnosis not present

## 2018-02-13 NOTE — Progress Notes (Signed)
Referring Physician: Dr Dimas Aguas  Patient name: Jose Bush MRN: [id number] DOB: 31-Aug-1941 Sex: male  REASON FOR CONSULT: Ischemia right foot  HPI: Jose Bush is a 77 y.o. male with a several week history of cellulitis in his right foot.  This was treated 6.  There has really not been significant improvement.  The patient has some pain in the toe.  He denies any prior nonhealing wounds.  Does have a history of chronic immunosuppression and is treated for lupus.  He was on steroids up until recently.  He still takes Plaquenil.  He denies prior claudication symptoms.  Is currently on doxycycline.  He is on chronic Coumadin for thrombophilia secondary to his lupus.  He did have a DVT in his left leg 10 years ago.  Other medical problems include arthritis, reflux, hypertension, prior stroke affecting his left side.  These are all currently stable.  Is a former smoker but quit almost 30 years ago.  Past Medical History:  Diagnosis Date  . Arthritis   . Chest pain, unspecified   . DVT (deep venous thrombosis) (HCC)   . GERD (gastroesophageal reflux disease)   . Heart murmur    hx of   . History of hiatal hernia   . HTN (hypertension)   . Lupus (HCC)   . Lupus (HCC)   . Shingles    hx of   . Stroke [facility]) 2010   'MILD" per pt-  weakness left side  . Tinnitus   . Ulcer of abdomen wall [facility])    Past Surgical History:  Procedure Laterality Date  . bilateral cataract surgery     . BIOPSY N/A 10/22/2017   Procedure: BIOPSY;  Surgeon: Franky Macho, MD;  [facility];  Service: Gastroenterology;  Laterality: N/A;  . COLONOSCOPY N/A 09/18/2016   Dr. Lovell Sheehan: normal  . ESOPHAGOGASTRODUODENOSCOPY N/A 10/22/2017   Dr. Lovell Sheehan: normal duodenum, gastric mucosal atrophy but negative H.pylori (Clotest negative), normal GE junction  . GIVENS CAPSULE STUDY N/A 01/27/2018   Procedure: GIVENS CAPSULE STUDY;  Surgeon: Corbin Ade, MD;  [facility];  Service: Endoscopy;   Laterality: N/A;  7:00am  . HERNIA REPAIR     2010/laparoscopic/right  . ivc filter     01/2013   . KNEE ARTHROSCOPY Right    2007  . TOTAL HIP ARTHROPLASTY Right 05/01/2017   Procedure: RIGHT TOTAL HIP ARTHROPLASTY ANTERIOR APPROACH;  Surgeon: Ollen Gross, MD;  [facility];  Service: Orthopedics;  Laterality: Right;  . TOTAL KNEE ARTHROPLASTY Left 12/13/2014   Procedure: LEFT TOTAL KNEE ARTHROPLASTY;  Surgeon: Ollen Gross, MD;  [facility];  Service: Orthopedics;  Laterality: Left;  . TOTAL KNEE ARTHROPLASTY Right 03/21/2015   Procedure: RIGHT TOTAL KNEE ARTHROPLASTY;  Surgeon: Ollen Gross, MD;  [facility];  Service: Orthopedics;  Laterality: Right;    Family History  Problem Relation Age of Onset  . Hypertension Other   . Stroke Mother   . Colon cancer Neg Hx     SOCIAL HISTORY: Social History   Socioeconomic History  . Marital status: Married    Spouse name: Not on file  . Number of children: Not on file  . Years of education: Not on file  . Highest education level: Not on file  Occupational History  . Not on file  Social Needs  . Financial resource strain: Not on file  . Food insecurity:    Worry: Not on file  Inability: Not on file  . Transportation needs:    Medical: Not on file    Non-medical: Not on file  Tobacco Use  . Smoking status: Former Smoker    Packs/day: 0.50    Years: 40.00    Pack years: 20.00    Types: Cigarettes    Last attempt to quit: 10/02/1983    Years since quitting: 34.3  . Smokeless tobacco: Never Used  Substance and Sexual Activity  . Alcohol use: No  . Drug use: No  . Sexual activity: Not on file  Lifestyle  . Physical activity:    Days per week: Not on file    Minutes per session: Not on file  . Stress: Not on file  Relationships  . Social connections:    Talks on phone: Not on file    Gets together: Not on file    Attends religious service: Not on file    Active member of club or organization: Not on  file    Attends meetings of clubs or organizations: Not on file    Relationship status: Not on file  . Intimate partner violence:    Fear of current or ex partner: Not on file    Emotionally abused: Not on file    Physically abused: Not on file    Forced sexual activity: Not on file  Other Topics Concern  . Not on file  Social History Narrative  . Not on file    No Known Allergies  Current Outpatient Medications  Medication Sig Dispense Refill  . acetaminophen (TYLENOL) 500 MG tablet Take 1,000 mg by mouth every 6 (six) hours as needed for moderate pain or headache.    Marland Kitchen Bioflavonoid Products (ESTER C PO) Take 500 mg by mouth daily.    Marland Kitchen docusate sodium (COLACE) 100 MG capsule Take 100 mg by mouth daily.     Marland Kitchen doxycycline (VIBRAMYCIN) 100 MG capsule Take 100 mg by mouth 2 (two) times daily.    . hydroxychloroquine (PLAQUENIL) 200 MG tablet Take 200 mg by mouth 2 (two) times daily.    Marland Kitchen lisinopril-hydrochlorothiazide (PRINZIDE,ZESTORETIC) 10-12.5 MG per tablet Take 1 tablet by mouth daily with breakfast.     . Menthol, Topical Analgesic, (ICY HOT EX) Apply 1 application topically 2 (two) times daily as needed (left knee.).     Marland Kitchen Misc Natural Products (URINOZINC PO) Take 1 capsule by mouth daily.    . Multiple Vitamin (MULTIVITAMIN WITH MINERALS) TABS tablet Take 1 tablet by mouth daily. Centrum Silver    . nitroGLYCERIN (NITROSTAT) 0.4 MG SL tablet Place 0.4 mg under the tongue every 5 (five) minutes as needed for chest pain. x3 doses as needed for chest pain    . OMEGA-3 KRILL OIL PO Take 1 capsule by mouth daily.    Marland Kitchen omeprazole (PRILOSEC) 20 MG capsule Take 20 mg by mouth daily.    Marland Kitchen warfarin (COUMADIN) 5 MG tablet Take 1 tablet (5 mg total) by mouth every morning. Take Coumadin for three weeks for postoperative protocol and then the patient may resume their previous Coumadin home regimen.  The dose may need to be adjusted based upon the INR.  Please follow the INR and titrate  Coumadin dose for a therapeutic range between 2.0 and 3.0 INR.  After completing the three weeks of Coumadin, the patient may resume their previous Coumadin home regimen. (Patient taking differently: Take 5 mg by mouth daily with breakfast. ) 35 tablet 0   No current facility-administered medications  for this visit.     ROS:   General:  No weight loss, Fever, chills  HEENT: No recent headaches, no nasal bleeding, no visual changes, no sore throat  Neurologic: No dizziness, blackouts, seizures. No recent symptoms of stroke or mini- stroke. No recent episodes of slurred speech, or temporary blindness.  Cardiac: No recent episodes of chest pain/pressure, no shortness of breath at rest.  No shortness of breath with exertion.  Denies history of atrial fibrillation or irregular heartbeat  Vascular: No history of rest pain in feet.  No history of claudication.  No history of non-healing ulcer, No history of DVT   Pulmonary: No home oxygen, no productive cough, no hemoptysis,  No asthma or wheezing  Musculoskeletal:   Arthritis,  Low back pain,   Joint pain  Hematologic:No history of hypercoagulable state.  No history of easy bleeding.  No history of anemia  Gastrointestinal: No hematochezia or melena,  No gastroesophageal reflux, no trouble swallowing  Urinary:  chronic Kidney disease,  on HD -  MWF or  TTHS,  Burning with urination,  Frequent urination,  Difficulty urinating;   Skin: No rashes  Psychological: No history of anxiety,  No history of depression   Physical Examination  Vitals:   02/13/18 1408  BP: 125/70  Pulse: 65  Resp: 16  Temp: (!) 97 F (36.1 C)  TempSrc: Oral  SpO2: 96%  Weight: 209 lb (94.8 kg)  Height:  (1.727 m)    Body mass index is 31.78 kg/m.  General:  Alert and oriented, no acute distress HEENT: Normal Neck: No bruit or JVD Pulmonary: Clear to auscultation bilaterally Cardiac: Regular Rate and Rhythm without  murmur Abdomen: Soft, non-tender, non-distended, no mass Skin: No rash, right second toe erythema extending from the tip all the way to the metatarsal head. Extremity Pulses:  2+ radial, brachial, femoral, popliteal absent dorsalis pedis, posterior tibial pulses bilaterally Musculoskeletal: No deformity or edema  Neurologic: Upper and lower extremity motor 5/5 and symmetric  DATA:  Patient had bilateral ABIs performed Feb 12, 2018.  These were done by [facility]ology.  ABI on the right was 0.59 left was 1.17  ASSESSMENT: Right foot ischemia right second toe is certainly threatened.  This may be a limb threatening situation.  He has a popliteal pulse suggesting that this is primarily tibial disease I would suspect secondary to his lupus.   PLAN: Abdominal aortogram with bilateral lower extremity runoff possible intervention in the right leg.  We will stop his Coumadin today.  He is scheduled to have the arteriogram by my partner Dr. Randie Heinz on Monday.  Risk benefits possible complications and procedure details including but not limited to bleeding infection vessel injury risk of limb loss contrast reaction were all explained to the patient today.  He understands and agrees to proceed.  Fabienne Bruns, MD [facility] Office: [phone] Pager: [phone]

## 2018-02-17 ENCOUNTER — Ambulatory Visit (HOSPITAL_COMMUNITY)
Admission: RE | Admit: 2018-02-17 | Discharge: 2018-02-17 | Disposition: A | Discharge status: Home / Self Care | Payer: Medicare HMO | Source: Ambulatory Visit | Attending: Vascular Surgery | Admitting: Vascular Surgery

## 2018-02-17 ENCOUNTER — Ambulatory Visit (HOSPITAL_COMMUNITY)
Admission: RE | Disposition: A | Discharge status: Home / Self Care | Payer: Self-pay | Source: Ambulatory Visit | Attending: Vascular Surgery

## 2018-02-17 ENCOUNTER — Encounter: Payer: Self-pay | Admitting: Family Medicine

## 2018-02-17 ENCOUNTER — Telehealth: Payer: Self-pay | Admitting: Vascular Surgery

## 2018-02-17 DIAGNOSIS — Z8673 Personal history of transient ischemic attack (TIA), and cerebral infarction without residual deficits: Secondary | ICD-10-CM | POA: Diagnosis not present

## 2018-02-17 DIAGNOSIS — Z823 Family history of stroke: Secondary | ICD-10-CM | POA: Insufficient documentation

## 2018-02-17 DIAGNOSIS — Z8619 Personal history of other infectious and parasitic diseases: Secondary | ICD-10-CM | POA: Diagnosis not present

## 2018-02-17 DIAGNOSIS — Z96641 Presence of right artificial hip joint: Secondary | ICD-10-CM | POA: Diagnosis not present

## 2018-02-17 DIAGNOSIS — Z96653 Presence of artificial knee joint, bilateral: Secondary | ICD-10-CM | POA: Insufficient documentation

## 2018-02-17 DIAGNOSIS — K219 Gastro-esophageal reflux disease without esophagitis: Secondary | ICD-10-CM | POA: Insufficient documentation

## 2018-02-17 DIAGNOSIS — Z86718 Personal history of other venous thrombosis and embolism: Secondary | ICD-10-CM | POA: Insufficient documentation

## 2018-02-17 DIAGNOSIS — Z79899 Other long term (current) drug therapy: Secondary | ICD-10-CM | POA: Insufficient documentation

## 2018-02-17 DIAGNOSIS — Z87891 Personal history of nicotine dependence: Secondary | ICD-10-CM | POA: Diagnosis not present

## 2018-02-17 DIAGNOSIS — Z9889 Other specified postprocedural states: Secondary | ICD-10-CM | POA: Insufficient documentation

## 2018-02-17 DIAGNOSIS — M329 Systemic lupus erythematosus, unspecified: Secondary | ICD-10-CM | POA: Insufficient documentation

## 2018-02-17 DIAGNOSIS — I10 Essential (primary) hypertension: Secondary | ICD-10-CM | POA: Diagnosis not present

## 2018-02-17 DIAGNOSIS — M1990 Unspecified osteoarthritis, unspecified site: Secondary | ICD-10-CM | POA: Diagnosis not present

## 2018-02-17 DIAGNOSIS — Z7901 Long term (current) use of anticoagulants: Secondary | ICD-10-CM | POA: Insufficient documentation

## 2018-02-17 DIAGNOSIS — I998 Other disorder of circulatory system: Secondary | ICD-10-CM | POA: Diagnosis not present

## 2018-02-17 DIAGNOSIS — Z8249 Family history of ischemic heart disease and other diseases of the circulatory system: Secondary | ICD-10-CM | POA: Diagnosis not present

## 2018-02-17 HISTORY — PX: PERIPHERAL VASCULAR BALLOON ANGIOPLASTY: CATH118281

## 2018-02-17 HISTORY — PX: ABDOMINAL AORTOGRAM W/LOWER EXTREMITY: CATH118223

## 2018-02-17 LAB — POCT I-STAT, CHEM 8
BUN: 12 mg/dL (ref 6–20)
CHLORIDE: 102 mmol/L (ref 101–111)
Creatinine, Ser: 0.5 mg/dL — ABNORMAL LOW (ref 0.61–1.24)
GLUCOSE: 80 mg/dL (ref 65–99)
HEMATOCRIT: 30 % — AB (ref 39.0–52.0)
Hemoglobin: 10.2 g/dL — ABNORMAL LOW (ref 13.0–17.0)
Potassium: 3.1 mmol/L — ABNORMAL LOW (ref 3.5–5.1)
SODIUM: 146 mmol/L — AB (ref 135–145)
TCO2: 23 mmol/L (ref 22–32)

## 2018-02-17 LAB — PROTIME-INR
INR: 1.15
PROTHROMBIN TIME: 14.6 s (ref 11.4–15.2)

## 2018-02-17 SURGERY — ABDOMINAL AORTOGRAM W/LOWER EXTREMITY
Anesthesia: LOCAL

## 2018-02-17 MED ORDER — HEPARIN SODIUM (PORCINE) 1000 UNIT/ML IJ SOLN
INTRAMUSCULAR | Status: AC
  Filled 2018-02-17: qty 1

## 2018-02-17 MED ORDER — ONDANSETRON HCL 4 MG/2ML IJ SOLN
4.0000 mg | Freq: Four times a day (QID) | INTRAMUSCULAR | Status: DC | PRN
Start: 2018-02-17 — End: 2018-02-17

## 2018-02-17 MED ORDER — ASPIRIN 325 MG PO TABS
ORAL_TABLET | ORAL | Status: AC
  Filled 2018-02-17: qty 1

## 2018-02-17 MED ORDER — HEPARIN (PORCINE) IN NACL 1000-0.9 UT/500ML-% IV SOLN
INTRAVENOUS | Status: AC
  Filled 2018-02-17: qty 1000

## 2018-02-17 MED ORDER — HYDRALAZINE HCL 20 MG/ML IJ SOLN: 5.0000 mg | INTRAMUSCULAR | Status: DC | PRN

## 2018-02-17 MED ORDER — HEPARIN (PORCINE) IN NACL 2-0.9 UNITS/ML
INTRAMUSCULAR | Status: AC | PRN
  Administered 2018-02-17 (×2): 500 mL

## 2018-02-17 MED ORDER — LIDOCAINE HCL (PF) 1 % IJ SOLN
INTRAMUSCULAR | Status: DC | PRN
  Administered 2018-02-17: 12 mL

## 2018-02-17 MED ORDER — ASPIRIN 325 MG PO TABS
ORAL_TABLET | ORAL | Status: DC | PRN
  Administered 2018-02-17: 325 mg via ORAL

## 2018-02-17 MED ORDER — FENTANYL CITRATE (PF) 100 MCG/2ML IJ SOLN
INTRAMUSCULAR | Status: AC
  Filled 2018-02-17: qty 2

## 2018-02-17 MED ORDER — HEPARIN SODIUM (PORCINE) 1000 UNIT/ML IJ SOLN
INTRAMUSCULAR | Status: DC | PRN
  Administered 2018-02-17: 10000 [IU] via INTRAVENOUS

## 2018-02-17 MED ORDER — SODIUM CHLORIDE 0.9 % IV SOLN
INTRAVENOUS | Status: DC
  Administered 2018-02-17: 08:00:00 via INTRAVENOUS

## 2018-02-17 MED ORDER — MIDAZOLAM HCL 2 MG/2ML IJ SOLN
INTRAMUSCULAR | Status: DC | PRN
  Administered 2018-02-17: 1 mg via INTRAVENOUS

## 2018-02-17 MED ORDER — MIDAZOLAM HCL 2 MG/2ML IJ SOLN
INTRAMUSCULAR | Status: AC
  Filled 2018-02-17: qty 2

## 2018-02-17 MED ORDER — ACETAMINOPHEN 325 MG PO TABS: 650.0000 mg | ORAL_TABLET | ORAL | Status: DC | PRN

## 2018-02-17 MED ORDER — SODIUM CHLORIDE 0.9% FLUSH: 3.0000 mL | Freq: Two times a day (BID) | INTRAVENOUS | Status: DC

## 2018-02-17 MED ORDER — LABETALOL HCL 5 MG/ML IV SOLN: 10.0000 mg | INTRAVENOUS | Status: DC | PRN

## 2018-02-17 MED ORDER — SODIUM CHLORIDE 0.9 % IV SOLN: 250.0000 mL | INTRAVENOUS | Status: DC | PRN

## 2018-02-17 MED ORDER — SODIUM CHLORIDE 0.9% FLUSH
3.0000 mL | INTRAVENOUS | Status: DC | PRN
Start: 2018-02-17 — End: 2018-02-17

## 2018-02-17 MED ORDER — SODIUM CHLORIDE 0.9 % WEIGHT BASED INFUSION: 1.0000 mL/kg/h | INTRAVENOUS | Status: DC

## 2018-02-17 MED ORDER — LIDOCAINE HCL (PF) 1 % IJ SOLN
INTRAMUSCULAR | Status: AC
  Filled 2018-02-17: qty 30

## 2018-02-17 MED ORDER — FENTANYL CITRATE (PF) 100 MCG/2ML IJ SOLN
INTRAMUSCULAR | Status: DC | PRN
  Administered 2018-02-17: 25 ug via INTRAVENOUS
  Administered 2018-02-17: 50 ug via INTRAVENOUS

## 2018-02-17 MED ORDER — ASPIRIN EC 81 MG PO TBEC
81.0000 mg | DELAYED_RELEASE_TABLET | Freq: Every day | ORAL | Status: DC
  Filled 2018-02-17: qty 1

## 2018-02-17 SURGICAL SUPPLY — 34 items
BALL STERLING OTW 2.5X150X150 (BALLOONS) ×1
BALLN STERLING OTW 2.5X150X150 (BALLOONS) ×2
BALLOON STRLNG OTW 2.5X150X150 (BALLOONS) IMPLANT
CATH CXI 2.3F 135 ANG (CATHETERS) ×2 IMPLANT
CATH CXI 4F 150 ST (CATHETERS) ×2 IMPLANT
CATH CXI SUPP ANG 4FR 135 (CATHETERS) ×1 IMPLANT
CATH CXI SUPP ANG 4FR 135CM (CATHETERS) ×3
CATH OMNI FLUSH 5F 65CM (CATHETERS) ×1 IMPLANT
CATH QUICKCROSS .018X135CM (MICROCATHETER) ×1 IMPLANT
CATH QUICKCROSS .035X135CM (MICROCATHETER) ×2 IMPLANT
COVER DOME SNAP 22 D (MISCELLANEOUS) ×1 IMPLANT
COVER PRB 48X5XTLSCP FOLD TPE (BAG) IMPLANT
COVER PROBE 5X48 (BAG) ×3
DEVICE CLOSURE MYNXGRIP 6/7F (Vascular Products) ×2 IMPLANT
DEVICE EN SNARE MINI 2-4MM (VASCULAR PRODUCTS) ×1 IMPLANT
DEVICE TORQUE .014-.018 (MISCELLANEOUS) ×1 IMPLANT
DEVICE TORQUE .025-.038 (MISCELLANEOUS) ×1 IMPLANT
GUIDEWIRE STR TIP .014X300X8 (WIRE) ×4 IMPLANT
KIT ENCORE 26 ADVANTAGE (KITS) ×1 IMPLANT
KIT MICROPUNCTURE NIT STIFF (SHEATH) ×2 IMPLANT
KIT PV (KITS) ×3 IMPLANT
SHEATH HIGHFLEX ANSEL 6FRX55 (SHEATH) ×1 IMPLANT
SHEATH PINNACLE 5F 10CM (SHEATH) ×2 IMPLANT
SHEATH PINNACLE 6F 10CM (SHEATH) ×2 IMPLANT
SHIELD RADPAD SCOOP 12X17 (MISCELLANEOUS) ×1 IMPLANT
SYR MEDRAD MARK V 150ML (SYRINGE) ×3 IMPLANT
TORQUE DEVICE .014-.018 (MISCELLANEOUS) ×3
TRANSDUCER W/STOPCOCK (MISCELLANEOUS) ×3 IMPLANT
TRAY PV CATH (CUSTOM PROCEDURE TRAY) ×3 IMPLANT
WIRE AQUATRAK .035X260 ANG (WIRE) ×1 IMPLANT
WIRE BENTSON .035X145CM (WIRE) ×2 IMPLANT
WIRE G V18X300CM (WIRE) ×1 IMPLANT
WIRE ROSEN-J .035X260CM (WIRE) ×1 IMPLANT
WIRE SPARTACORE .014X300CM (WIRE) ×1 IMPLANT

## 2018-02-17 NOTE — Telephone Encounter (Signed)
-----   Message from Westley Hummer, RN sent at 02/17/2018  1:06 PM EDT ----- Regarding: Appointment   ----- Message ----- From: Maeola Harman, MD Sent: 02/17/2018   1:00 PM To: [address]  BAYNE FOSNAUGH [id number] 01/22/1941  Pre-operative Diagnosis: Critical right lower extremity ischemia  Surgeon:  Luanna Salk. Randie Heinz, MD  Procedure Performed: 1.  Ultrasound-guided cannulation left common femoral artery 2.  Aortogram with bilateral lower extremity runoff 3.  Plain balloon angioplasty of right posterior tibial artery with 2.5 mm balloon 4.  Snare of broken wire tip from right posterior tibial artery 5.  Moderate sedation with fentanyl and Versed for 97 minutes 6.  Closure of left common femoral artery with Mynx device  F/u in 2-3 weeks on a thurs or Fri with np or pa for toe wound check s/p angiogram

## 2018-02-17 NOTE — Discharge Instructions (Signed)
**Note -identified via Obfuscation** Femoral Site Care °Refer to this sheet in the next few weeks. These instructions provide you with information about caring for yourself after your procedure. Your health care provider may also give you more specific instructions. Your treatment has been planned according to current medical practices, but problems sometimes occur. Call your health care provider if you have any problems or questions after your procedure. °What can I expect after the procedure? °After your procedure, it is typical to have the following: °· Bruising at the site that usually fades within 1-2 weeks. °· Blood collecting in the tissue (hematoma) that may be painful to the touch. It should usually decrease in size and tenderness within 1-2 weeks. ° °Follow these instructions at home: °· Take medicines only as directed by your health care provider. °· You may shower 24-48 hours after the procedure or as directed by your health care provider. Remove the bandage (dressing) and gently wash the site with plain soap and water. Pat the area dry with a clean towel. Do not rub the site, because this may cause bleeding. °· Do not take baths, swim, or use a hot tub until your health care provider approves. °· Check your insertion site every day for redness, swelling, or drainage. °· Do not apply powder or lotion to the site. °· Limit use of stairs to twice a day for the first 2-3 days or as directed by your health care provider. °· Do not squat for the first 2-3 days or as directed by your health care provider. °· Do not lift over 10 lb (4.5 kg) for 5 days after your procedure or as directed by your health care provider. °· Ask your health care provider when it is okay to: °? Return to work or school. °? Resume usual physical activities or sports. °? Resume sexual activity. °· Do not drive home if you are discharged the same day as the procedure. Have someone else drive you. °· You may drive 24 hours after the procedure unless otherwise instructed by  your health care provider. °· Do not operate machinery or power tools for 24 hours after the procedure or as directed by your health care provider. °· If your procedure was done as an outpatient procedure, which means that you went home the same day as your procedure, a responsible adult should be with you for the first 24 hours after you arrive home. °· Keep all follow-up visits as directed by your health care provider. This is important. °Contact a health care provider if: °· You have a fever. °· You have chills. °· You have increased bleeding from the site. Hold pressure on the site. °Get help right away if: °· You have unusual pain at the site. °· You have redness, warmth, or swelling at the site. °· You have drainage (other than a small amount of blood on the dressing) from the site. °· The site is bleeding, and the bleeding does not stop after 30 minutes of holding steady pressure on the site. °· Your leg or foot becomes pale, cool, tingly, or numb. °This information is not intended to replace advice given to you by your health care provider. Make sure you discuss any questions you have with your health care provider. °Document Released: 05/21/2014 Document Revised: 02/23/2016 Document Reviewed: 04/06/2014 °Elsevier Interactive Patient Education © 2018 [employer] ° °

## 2018-02-17 NOTE — Telephone Encounter (Signed)
sch appt 03/14/18 3pm wound check NA/PA s/p angiogram

## 2018-02-17 NOTE — Op Note (Signed)
Patient name: Jose Bush MRN: [id number] DOB: 13-Dec-1940 Sex: male  02/17/2018 Pre-operative Diagnosis: Critical right lower extremity ischemia Post-operative diagnosis:  Same Surgeon:  Apolinar Junes C. Randie Heinz, MD Procedure Performed: 1.  Ultrasound-guided cannulation left common femoral artery 2.  Aortogram with bilateral lower extremity runoff 3.  Plain balloon angioplasty of right posterior tibial artery with 2.5 mm balloon 4.  Snare of broken wire tip from right posterior tibial artery 5.  Moderate sedation with fentanyl and Versed for 97 minutes 6.  Closure of left common femoral artery with Mynx device   Indications: 77 year old male with recent history of right second toe erythema treated with antibiotics.  He has a decreased ABI and is indicated for right lower extremity injury and possible intervention.  Findings: The aorta and iliac segments are free of disease.  Bilaterally he appears free of disease all the way to the popliteal arteries.  Patient has involuntary movement which limits evaluation of the tibials below the left knee.  On the right side we use a catheter at the level and need to demonstrate that the peroneal artery was a dominant runoff to the level of the ankle.  He does not have any demonstrable flow in the posterior tibial or anterior tibial artery after the mid calf.  After we intervened on the posterior tibial artery distally we had flow all the way to the level of the ankle that was improved where once it was occluded now there was no stenosis or residual dissection.  Unfortunately the flow ends at the foot although there is a much improved signal to the ankle hopefully will increase the flow to the foot enough to prevent worsened ulceration of his second toe.  We did have a broken wire distally in the posterior tibial artery that was snared without complication.   Procedure:  The patient was identified in the holding area and taken to room 8.  The patient was then  placed supine on the table and prepped and draped in the usual sterile fashion.  A time out was called.  Ultrasound was used to evaluate the left common femoral artery which was noted to be patent.  1% lidocaine was injected.  A picture was saved to chart for the permanent record.  We then cannulated the artery directly using ultrasound guidance and a micropuncture needle and a wire and catheter were placed.  We then placed a Bentson wire placed an Omni Flush catheter after we placed a 5 French sheath this was into the aorta.  We performed aortogram pulled down to the bifurcation perform bilateral lower extremity runoff with the above findings.  We then crossed the bifurcation and placed the Glidewire to the level of the right popliteal artery under fluoroscopic guidance and used a quick cross catheter performed angiography which demonstrated the above findings.  We then exchanged for a long 6 French sheath into the SFA on the right and the patient was heparinized.  We then used initially and 014 [place] wire to attempt to cross the posterior tibial artery occlusive disease.  We got her a couple occlusions but could not ever get into the distal vessels on the foot.  We then exchanged for a V 14 wire and unfortunately the tip of this did break.  We then exchanged for an 035 catheter over [place] core wire.  We were able to then use a coronary snare and snare the tip of the wire we informed that it was in fact pulled out.  With this we abandon treating the PTA for the time.  We then selected the anterior tibial artery with a V 18 wire.  We were able to cross down to the level of the ankle but this appeared to be likely collateralization.  With this this was abandoned we switched back to treating the posterior tibial.  We will get the V 18 all the way to the level of the ankle confirmed intraluminal access.  There were no small vessel stress to select to get into the plantar vessels.  With this we used a 2.5 mm balloon  to balloon back the posterior tibial.  I completion there was no residual dissection were once were occluded we had 0% residual stenosis but again it does give out the level of the ankle.  We checked a signal and had a much improved signal.  We then exchanged for a short 6 [place] sheath.  A minx device was deployed.  He tolerated procedure well without immediate comp occasion.  Next  Contrast:180cc  Brandon C. Randie Heinz, MD [facility] Office: [phone] Pager: [phone]

## 2018-02-17 NOTE — H&P (Signed)
   History and Physical Update  The patient was interviewed and re-examined.  The patient's previous History and Physical has been reviewed and is unchanged from previous. Plan aortogram with possible intervention of right leg.  Jose Hegel C. Randie Heinz, MD [facility] Office: [phone] Pager: [phone]   02/17/2018, 8:47 AM

## 2018-02-18 ENCOUNTER — Encounter (HOSPITAL_COMMUNITY): Payer: Self-pay | Admitting: Vascular Surgery

## 2018-02-18 MED FILL — Heparin Sod (Porcine)-NaCl IV Soln 1000 Unit/500ML-0.9%: INTRAVENOUS | Qty: 1000 | Status: AC

## 2018-02-20 DIAGNOSIS — I82503 Chronic embolism and thrombosis of unspecified deep veins of lower extremity, bilateral: Secondary | ICD-10-CM | POA: Diagnosis not present

## 2018-02-20 DIAGNOSIS — I70229 Atherosclerosis of native arteries of extremities with rest pain, unspecified extremity: Secondary | ICD-10-CM | POA: Diagnosis not present

## 2018-02-25 DIAGNOSIS — I639 Cerebral infarction, unspecified: Secondary | ICD-10-CM | POA: Diagnosis not present

## 2018-02-25 DIAGNOSIS — I82499 Acute embolism and thrombosis of other specified deep vein of unspecified lower extremity: Secondary | ICD-10-CM | POA: Diagnosis not present

## 2018-02-25 DIAGNOSIS — I739 Peripheral vascular disease, unspecified: Secondary | ICD-10-CM | POA: Diagnosis not present

## 2018-02-25 DIAGNOSIS — Z6831 Body mass index (BMI) 31.0-31.9, adult: Secondary | ICD-10-CM | POA: Diagnosis not present

## 2018-02-27 ENCOUNTER — Other Ambulatory Visit: Payer: Self-pay

## 2018-02-27 ENCOUNTER — Encounter: Payer: Self-pay | Admitting: *Deleted

## 2018-02-27 ENCOUNTER — Encounter: Payer: Self-pay | Admitting: Vascular Surgery

## 2018-02-27 ENCOUNTER — Other Ambulatory Visit: Payer: Self-pay | Admitting: *Deleted

## 2018-02-27 ENCOUNTER — Ambulatory Visit (INDEPENDENT_AMBULATORY_CARE_PROVIDER_SITE_OTHER): Payer: Medicare HMO | Admitting: Vascular Surgery

## 2018-02-27 ENCOUNTER — Encounter (HOSPITAL_COMMUNITY): Payer: Self-pay | Admitting: *Deleted

## 2018-02-27 VITALS — BP 120/72 | HR 77 | Temp 97.0°F | Resp 16 | Ht 68.0 in | Wt 210.8 lb

## 2018-02-27 DIAGNOSIS — I739 Peripheral vascular disease, unspecified: Secondary | ICD-10-CM

## 2018-02-27 NOTE — Progress Notes (Signed)
Pt denies SOB, chest pain, and being under the care of a cardiologist. Pt denies having a stress test, and cardiac cath. Pt denies having a chest x ray within the last year. Pt stated that the last dose of Coumadin was taken today. Pt made aware to stop taking vitamins, Ester C,  fish oil, Krill oil and herbal medications. Do not take any NSAIDs ie: Ibuprofen, Advil, Naproxen (Aleve), Motrin, BC and Goody Powder. Pt stated that he must continue taking Urinozinc (otc) for his prostate. Pt verbalized understanding of all pre-op instructions.

## 2018-02-27 NOTE — Progress Notes (Signed)
Patient is a 77 year old male who returns for follow-up today.  He was last seen on May 16 for evaluation of a chronic cellulitis in his right foot.  He is on immunosuppression for lupus.  He is also on Coumadin for thrombophilia and prior left leg DVT secondary to his lupus.  He did have a DVT in his leg 10 years ago.  He recently underwent aortogram lower extremity runoff with recanalization of the posterior tibial artery by my partner Dr. Pascal Lux.  He did have one vessel runoff via the peroneal artery.  He presents today for further follow-up.  He denies any fever or chills.  He does not think the toe has improved significantly.  Physical exam:  Vitals:   02/27/18 1347  BP: 120/72  Pulse: 77  Resp: 16  Temp: (!) 97 F (36.1 C)  TempSrc: Oral  SpO2: 95%  Weight: 210 lb 12.8 oz (95.6 kg)  Height:  (1.727 m)    Extremities: No palpable pedal pulses right foot right foot is pink warm and appears well perfused right second toe is edematous extending all the way to the metatarsal head.  The tip of the right second toe is macerated with no evidence of healing.  There is some edema in the first toe but this does not appear to be actively infected.  Assessment: The patient has severe distal tibial artery occlusive disease.  I believe at this point his perfusion is as good it is it is going to get.  I do not think he is going to clear the infection of his right second toe.  I believe the best option at this point of the right second toe amputation to try to prevent the risk of a below-knee amputation.  However, I did counsel the patient and his wife today that he is still at very high risk for below-knee amputation in the right leg even with amputation of the toe.  Plan: We will stop his Coumadin today.  Plan for right second toe amputation on Monday, March 03, 2018.  We will plan to admit him for 23-hour observation post procedure.  We will also place him on oral antibiotics again post procedure in  the hopes that we can fully clear the infection in his right foot.  Fabienne Bruns, MD [facility] Office: [phone] Pager: [phone]

## 2018-02-27 NOTE — H&P (View-Only) (Signed)
Patient is a 77-year-old male who returns for follow-up today.  He was last seen on May 16 for evaluation of a chronic cellulitis in his right foot.  He is on immunosuppression for lupus.  He is also on Coumadin for thrombophilia and prior left leg DVT secondary to his lupus.  He did have a DVT in his leg 10 years ago.  He recently underwent aortogram lower extremity runoff with recanalization of the posterior tibial artery by my partner Dr. Kane.  He did have one vessel runoff via the peroneal artery.  He presents today for further follow-up.  He denies any fever or chills.  He does not think the toe has improved significantly.  Physical exam:  Vitals:   02/27/18 1347  BP: 120/72  Pulse: 77  Resp: 16  Temp: (!) 97 F (36.1 C)  TempSrc: Oral  SpO2: 95%  Weight: 210 lb 12.8 oz (95.6 kg)  Height: 5' 8" (1.727 m)    Extremities: No palpable pedal pulses right foot right foot is pink warm and appears well perfused right second toe is edematous extending all the way to the metatarsal head.  The tip of the right second toe is macerated with no evidence of healing.  There is some edema in the first toe but this does not appear to be actively infected.  Assessment: The patient has severe distal tibial artery occlusive disease.  I believe at this point his perfusion is as good it is it is going to get.  I do not think he is going to clear the infection of his right second toe.  I believe the best option at this point of the right second toe amputation to try to prevent the risk of a below-knee amputation.  However, I did counsel the patient and his wife today that he is still at very high risk for below-knee amputation in the right leg even with amputation of the toe.  Plan: We will stop his Coumadin today.  Plan for right second toe amputation on Monday, March 03, 2018.  We will plan to admit him for 23-hour observation post procedure.  We will also place him on oral antibiotics again post procedure in  the hopes that we can fully clear the infection in his right foot.  Rosanna Bickle, MD [facility] [place] Office: [phone] Pager: [phone]  

## 2018-03-03 ENCOUNTER — Observation Stay (HOSPITAL_COMMUNITY)
Admission: RE | Admit: 2018-03-03 | Discharge: 2018-03-04 | Disposition: A | Discharge status: Home / Self Care | Payer: Medicare HMO | Source: Ambulatory Visit | Attending: Vascular Surgery | Admitting: Vascular Surgery

## 2018-03-03 ENCOUNTER — Ambulatory Visit (HOSPITAL_COMMUNITY): Payer: Medicare HMO | Admitting: Certified Registered"

## 2018-03-03 ENCOUNTER — Encounter (HOSPITAL_COMMUNITY)
Admission: RE | Disposition: A | Discharge status: Home / Self Care | Payer: Self-pay | Source: Ambulatory Visit | Attending: Vascular Surgery

## 2018-03-03 DIAGNOSIS — Z87891 Personal history of nicotine dependence: Secondary | ICD-10-CM | POA: Insufficient documentation

## 2018-03-03 DIAGNOSIS — M25571 Pain in right ankle and joints of right foot: Secondary | ICD-10-CM | POA: Diagnosis not present

## 2018-03-03 DIAGNOSIS — L03031 Cellulitis of right toe: Principal | ICD-10-CM | POA: Insufficient documentation

## 2018-03-03 DIAGNOSIS — I739 Peripheral vascular disease, unspecified: Secondary | ICD-10-CM | POA: Diagnosis not present

## 2018-03-03 DIAGNOSIS — Z7901 Long term (current) use of anticoagulants: Secondary | ICD-10-CM | POA: Diagnosis not present

## 2018-03-03 DIAGNOSIS — I998 Other disorder of circulatory system: Secondary | ICD-10-CM | POA: Diagnosis not present

## 2018-03-03 DIAGNOSIS — D6859 Other primary thrombophilia: Secondary | ICD-10-CM | POA: Insufficient documentation

## 2018-03-03 DIAGNOSIS — L089 Local infection of the skin and subcutaneous tissue, unspecified: Secondary | ICD-10-CM | POA: Diagnosis not present

## 2018-03-03 DIAGNOSIS — Z79899 Other long term (current) drug therapy: Secondary | ICD-10-CM | POA: Insufficient documentation

## 2018-03-03 DIAGNOSIS — I96 Gangrene, not elsewhere classified: Secondary | ICD-10-CM | POA: Diagnosis not present

## 2018-03-03 DIAGNOSIS — I10 Essential (primary) hypertension: Secondary | ICD-10-CM | POA: Insufficient documentation

## 2018-03-03 HISTORY — DX: Peripheral vascular disease, unspecified: I73.9

## 2018-03-03 HISTORY — DX: Presence of dental prosthetic device (complete) (partial): Z97.2

## 2018-03-03 HISTORY — DX: Presence of spectacles and contact lenses: Z97.3

## 2018-03-03 HISTORY — PX: AMPUTATION: SHX166

## 2018-03-03 LAB — BASIC METABOLIC PANEL
Anion gap: 8 (ref 5–15)
BUN: 13 mg/dL (ref 6–20)
CALCIUM: 9.4 mg/dL (ref 8.9–10.3)
CO2: 26 mmol/L (ref 22–32)
Chloride: 107 mmol/L (ref 101–111)
Creatinine, Ser: 0.72 mg/dL (ref 0.61–1.24)
GFR calc Af Amer: >60 mL/min (ref >60)
GFR calc non Af Amer: >60 mL/min (ref >60)
GLUCOSE: 91 mg/dL (ref 65–99)
Potassium: 3.8 mmol/L (ref 3.5–5.1)
Sodium: 141 mmol/L (ref 135–145)

## 2018-03-03 LAB — CBC
HCT: 36.8 % — ABNORMAL LOW (ref 39.0–52.0)
HEMOGLOBIN: 12.1 g/dL — AB (ref 13.0–17.0)
MCH: 30 pg (ref 26.0–34.0)
MCHC: 32.9 g/dL (ref 30.0–36.0)
MCV: 91.1 fL (ref 78.0–100.0)
PLATELETS: 226 10*3/uL (ref 150–400)
RBC: 4.04 MIL/uL — ABNORMAL LOW (ref 4.22–5.81)
RDW: 14.2 % (ref 11.5–15.5)
WBC: 7.6 10*3/uL (ref 4.0–10.5)

## 2018-03-03 LAB — PROTIME-INR
INR: 1.25
PROTHROMBIN TIME: 15.6 s — AB (ref 11.4–15.2)

## 2018-03-03 SURGERY — AMPUTATION DIGIT
Anesthesia: General | Laterality: Right

## 2018-03-03 MED ORDER — PHENYLEPHRINE 40 MCG/ML (10ML) SYRINGE FOR IV PUSH (FOR BLOOD PRESSURE SUPPORT)
PREFILLED_SYRINGE | INTRAVENOUS | Status: DC | PRN
  Administered 2018-03-03: 40 ug via INTRAVENOUS
  Administered 2018-03-03: 80 ug via INTRAVENOUS

## 2018-03-03 MED ORDER — SODIUM CHLORIDE 0.9 % IV SOLN: INTRAVENOUS | Status: DC

## 2018-03-03 MED ORDER — PHENOL 1.4 % MT LIQD: 1.0000 | OROMUCOSAL | Status: DC | PRN

## 2018-03-03 MED ORDER — ONDANSETRON HCL 4 MG/2ML IJ SOLN
INTRAMUSCULAR | Status: DC | PRN
  Administered 2018-03-03: 4 mg via INTRAVENOUS

## 2018-03-03 MED ORDER — MAGNESIUM SULFATE 2 GM/50ML IV SOLN: 2.0000 g | Freq: Every day | INTRAVENOUS | Status: DC | PRN

## 2018-03-03 MED ORDER — FENTANYL CITRATE (PF) 100 MCG/2ML IJ SOLN
INTRAMUSCULAR | Status: DC | PRN
  Administered 2018-03-03 (×3): 50 ug via INTRAVENOUS

## 2018-03-03 MED ORDER — DEXAMETHASONE SODIUM PHOSPHATE 4 MG/ML IJ SOLN
INTRAMUSCULAR | Status: DC | PRN
  Administered 2018-03-03: 8 mg via INTRAVENOUS

## 2018-03-03 MED ORDER — FENTANYL CITRATE (PF) 250 MCG/5ML IJ SOLN
INTRAMUSCULAR | Status: AC
Start: 2018-03-03 — End: ?
  Filled 2018-03-03: qty 5

## 2018-03-03 MED ORDER — ONDANSETRON HCL 4 MG/2ML IJ SOLN: 4.0000 mg | Freq: Four times a day (QID) | INTRAMUSCULAR | Status: DC | PRN

## 2018-03-03 MED ORDER — LABETALOL HCL 5 MG/ML IV SOLN: 10.0000 mg | INTRAVENOUS | Status: DC | PRN

## 2018-03-03 MED ORDER — LIDOCAINE 2% (20 MG/ML) 5 ML SYRINGE
INTRAMUSCULAR | Status: DC | PRN
  Administered 2018-03-03: 40 mg via INTRAVENOUS

## 2018-03-03 MED ORDER — ACETAMINOPHEN 325 MG RE SUPP: 325.0000 mg | RECTAL | Status: DC | PRN

## 2018-03-03 MED ORDER — PHENYLEPHRINE 40 MCG/ML (10ML) SYRINGE FOR IV PUSH (FOR BLOOD PRESSURE SUPPORT)
PREFILLED_SYRINGE | INTRAVENOUS | Status: AC
Start: 2018-03-03 — End: ?
  Filled 2018-03-03: qty 10

## 2018-03-03 MED ORDER — HYDROMORPHONE HCL 1 MG/ML IJ SOLN
0.5000 mg | INTRAMUSCULAR | Status: DC | PRN
  Administered 2018-03-03: 0.5 mg via INTRAVENOUS
  Filled 2018-03-03: qty 1

## 2018-03-03 MED ORDER — LACTATED RINGERS IV SOLN
INTRAVENOUS | Status: DC | PRN
  Administered 2018-03-03: 10:00:00 via INTRAVENOUS

## 2018-03-03 MED ORDER — LIDOCAINE 2% (20 MG/ML) 5 ML SYRINGE
INTRAMUSCULAR | Status: AC
Start: 2018-03-03 — End: ?
  Filled 2018-03-03: qty 5

## 2018-03-03 MED ORDER — LISINOPRIL-HYDROCHLOROTHIAZIDE 10-12.5 MG PO TABS: 1.0000 | ORAL_TABLET | Freq: Every day | ORAL | Status: DC

## 2018-03-03 MED ORDER — WARFARIN - PHYSICIAN DOSING INPATIENT: Freq: Every day | Status: DC

## 2018-03-03 MED ORDER — HYDRALAZINE HCL 20 MG/ML IJ SOLN: 5.0000 mg | INTRAMUSCULAR | Status: DC | PRN

## 2018-03-03 MED ORDER — OXYCODONE HCL 5 MG/5ML PO SOLN: 5.0000 mg | Freq: Once | ORAL | Status: AC | PRN

## 2018-03-03 MED ORDER — TRAMADOL HCL 50 MG PO TABS
50.0000 mg | ORAL_TABLET | Freq: Four times a day (QID) | ORAL | Status: DC
  Administered 2018-03-03 – 2018-03-04 (×3): 50 mg via ORAL
  Filled 2018-03-03 (×3): qty 1

## 2018-03-03 MED ORDER — ACETAMINOPHEN 325 MG PO TABS
325.0000 mg | ORAL_TABLET | ORAL | Status: DC | PRN
  Administered 2018-03-03 – 2018-03-04 (×3): 650 mg via ORAL
  Filled 2018-03-03 (×2): qty 2

## 2018-03-03 MED ORDER — PROPOFOL 10 MG/ML IV BOLUS
INTRAVENOUS | Status: DC | PRN
  Administered 2018-03-03: 130 mg via INTRAVENOUS
  Administered 2018-03-03: 20 mg via INTRAVENOUS

## 2018-03-03 MED ORDER — HYDROXYCHLOROQUINE SULFATE 200 MG PO TABS
200.0000 mg | ORAL_TABLET | Freq: Two times a day (BID) | ORAL | Status: DC
  Administered 2018-03-03 – 2018-03-04 (×2): 200 mg via ORAL
  Filled 2018-03-03 (×2): qty 1

## 2018-03-03 MED ORDER — OXYCODONE HCL 5 MG PO TABS
ORAL_TABLET | ORAL | Status: AC
  Administered 2018-03-03: 5 mg via ORAL
  Filled 2018-03-03: qty 1

## 2018-03-03 MED ORDER — OXYCODONE HCL 5 MG PO TABS
5.0000 mg | ORAL_TABLET | Freq: Once | ORAL | Status: AC | PRN
  Administered 2018-03-03: 5 mg via ORAL

## 2018-03-03 MED ORDER — LISINOPRIL 10 MG PO TABS
10.0000 mg | ORAL_TABLET | Freq: Every day | ORAL | Status: DC
  Administered 2018-03-04: 10 mg via ORAL
  Filled 2018-03-03: qty 1

## 2018-03-03 MED ORDER — DEXAMETHASONE SODIUM PHOSPHATE 10 MG/ML IJ SOLN
INTRAMUSCULAR | Status: AC
Start: 2018-03-03 — End: ?
  Filled 2018-03-03: qty 1

## 2018-03-03 MED ORDER — CEFAZOLIN SODIUM-DEXTROSE 2-4 GM/100ML-% IV SOLN
2.0000 g | INTRAVENOUS | Status: AC
  Administered 2018-03-03: 2 g via INTRAVENOUS
  Filled 2018-03-03: qty 100

## 2018-03-03 MED ORDER — FENTANYL CITRATE (PF) 100 MCG/2ML IJ SOLN
INTRAMUSCULAR | Status: AC
  Filled 2018-03-03: qty 2

## 2018-03-03 MED ORDER — MORPHINE SULFATE (PF) 4 MG/ML IV SOLN
4.0000 mg | INTRAVENOUS | Status: DC | PRN
  Filled 2018-03-03: qty 1

## 2018-03-03 MED ORDER — PROPOFOL 10 MG/ML IV BOLUS
INTRAVENOUS | Status: AC
  Filled 2018-03-03: qty 20

## 2018-03-03 MED ORDER — ALUM & MAG HYDROXIDE-SIMETH 200-200-20 MG/5ML PO SUSP: 15.0000 mL | ORAL | Status: DC | PRN

## 2018-03-03 MED ORDER — LACTATED RINGERS IV SOLN
INTRAVENOUS | Status: DC
  Administered 2018-03-03: 10 mL/h via INTRAVENOUS

## 2018-03-03 MED ORDER — PANTOPRAZOLE SODIUM 40 MG PO TBEC
40.0000 mg | DELAYED_RELEASE_TABLET | Freq: Every day | ORAL | Status: DC
Start: 2018-03-04 — End: 2018-03-04
  Administered 2018-03-04: 40 mg via ORAL
  Filled 2018-03-03: qty 1

## 2018-03-03 MED ORDER — DOCUSATE SODIUM 100 MG PO CAPS: 100.0000 mg | ORAL_CAPSULE | Freq: Every day | ORAL | Status: DC

## 2018-03-03 MED ORDER — WARFARIN SODIUM 5 MG PO TABS
5.0000 mg | ORAL_TABLET | Freq: Every day | ORAL | Status: DC
  Administered 2018-03-03: 5 mg via ORAL
  Filled 2018-03-03: qty 1

## 2018-03-03 MED ORDER — METOPROLOL TARTRATE 5 MG/5ML IV SOLN: 2.0000 mg | INTRAVENOUS | Status: DC | PRN

## 2018-03-03 MED ORDER — POTASSIUM CHLORIDE CRYS ER 20 MEQ PO TBCR: 20.0000 meq | EXTENDED_RELEASE_TABLET | Freq: Every day | ORAL | Status: DC | PRN

## 2018-03-03 MED ORDER — GUAIFENESIN-DM 100-10 MG/5ML PO SYRP: 15.0000 mL | ORAL_SOLUTION | ORAL | Status: DC | PRN

## 2018-03-03 MED ORDER — ONDANSETRON HCL 4 MG/2ML IJ SOLN
INTRAMUSCULAR | Status: AC
  Filled 2018-03-03: qty 2

## 2018-03-03 MED ORDER — 0.9 % SODIUM CHLORIDE (POUR BTL) OPTIME
TOPICAL | Status: DC | PRN
  Administered 2018-03-03: 1000 mL

## 2018-03-03 MED ORDER — ONDANSETRON HCL 4 MG/2ML IJ SOLN: 4.0000 mg | Freq: Once | INTRAMUSCULAR | Status: DC | PRN

## 2018-03-03 MED ORDER — DOXYCYCLINE HYCLATE 100 MG PO TABS
100.0000 mg | ORAL_TABLET | Freq: Two times a day (BID) | ORAL | Status: DC
  Administered 2018-03-03 – 2018-03-04 (×2): 100 mg via ORAL
  Filled 2018-03-03 (×2): qty 1

## 2018-03-03 MED ORDER — HYDROCHLOROTHIAZIDE 12.5 MG PO CAPS
12.5000 mg | ORAL_CAPSULE | Freq: Every day | ORAL | Status: DC
  Filled 2018-03-03: qty 1

## 2018-03-03 MED ORDER — FENTANYL CITRATE (PF) 100 MCG/2ML IJ SOLN
25.0000 ug | INTRAMUSCULAR | Status: DC | PRN
  Administered 2018-03-03: 50 ug via INTRAVENOUS

## 2018-03-03 MED ORDER — EPHEDRINE SULFATE 50 MG/ML IJ SOLN
INTRAMUSCULAR | Status: AC
  Filled 2018-03-03: qty 1

## 2018-03-03 SURGICAL SUPPLY — 31 items
BANDAGE ACE 4X5 VEL STRL LF (GAUZE/BANDAGES/DRESSINGS) ×1 IMPLANT
BANDAGE ELASTIC 4 VELCRO ST LF (GAUZE/BANDAGES/DRESSINGS) ×1 IMPLANT
BNDG GAUZE ELAST 4 BULKY (GAUZE/BANDAGES/DRESSINGS) ×2 IMPLANT
CANISTER SUCT 3000ML PPV (MISCELLANEOUS) ×2 IMPLANT
COVER SURGICAL LIGHT HANDLE (MISCELLANEOUS) ×2 IMPLANT
DRAPE EXTREMITY T 121X128X90 (DRAPE) ×2 IMPLANT
DRAPE HALF SHEET 40X57 (DRAPES) ×2 IMPLANT
DRSG EMULSION OIL 3X3 NADH (GAUZE/BANDAGES/DRESSINGS) ×2 IMPLANT
ELECT REM PT RETURN 9FT ADLT (ELECTROSURGICAL) ×2
ELECTRODE REM PT RTRN 9FT ADLT (ELECTROSURGICAL) ×1 IMPLANT
GAUZE SPONGE 4X4 12PLY STRL (GAUZE/BANDAGES/DRESSINGS) ×2 IMPLANT
GLOVE BIO SURGEON STRL SZ7.5 (GLOVE) ×2 IMPLANT
GLOVE BIOGEL PI IND STRL 6.5 (GLOVE) IMPLANT
GLOVE BIOGEL PI IND STRL 7.0 (GLOVE) IMPLANT
GLOVE BIOGEL PI INDICATOR 6.5 (GLOVE) ×1
GLOVE BIOGEL PI INDICATOR 7.0 (GLOVE) ×1
GOWN STRL REUS W/ TWL LRG LVL3 (GOWN DISPOSABLE) ×3 IMPLANT
GOWN STRL REUS W/TWL LRG LVL3 (GOWN DISPOSABLE) ×4
KIT BASIN OR (CUSTOM PROCEDURE TRAY) ×2 IMPLANT
KIT TURNOVER KIT B (KITS) ×2 IMPLANT
NS IRRIG 1000ML POUR BTL (IV SOLUTION) ×2 IMPLANT
PACK GENERAL/GYN (CUSTOM PROCEDURE TRAY) ×2 IMPLANT
PAD ARMBOARD 7.5X6 YLW CONV (MISCELLANEOUS) ×4 IMPLANT
SPECIMEN JAR SMALL (MISCELLANEOUS) ×1 IMPLANT
SUT ETHILON 3 0 PS 1 (SUTURE) ×3 IMPLANT
SUT VIC AB 3-0 SH 27 (SUTURE)
SUT VIC AB 3-0 SH 27X BRD (SUTURE) IMPLANT
TOWEL GREEN STERILE (TOWEL DISPOSABLE) ×4 IMPLANT
TOWEL GREEN STERILE FF (TOWEL DISPOSABLE) ×2 IMPLANT
UNDERPAD 30X30 (UNDERPADS AND DIAPERS) ×2 IMPLANT
WATER STERILE IRR 1000ML POUR (IV SOLUTION) ×2 IMPLANT

## 2018-03-03 NOTE — Progress Notes (Signed)
Patient received from PACU in stable condition. R foot warm and able to wiggle toes. +2 anterior tibial pulse and pain well controlled at 3/10 per patient at this time. Tolerating clear liquids well and asking for dinner. Family at bedside. Oriented to room and unit.

## 2018-03-03 NOTE — Interval H&P Note (Signed)
History and Physical Interval Note:  03/03/2018 3:07 PM  Jose Bush  has presented today for surgery, with the diagnosis of nonviable tissue right second toe  The various methods of treatment have been discussed with the patient and family. After consideration of risks, benefits and other options for treatment, the patient has consented to  Procedure(s): AMPUTATION RIGHT SECOND TOE (Right) as a surgical intervention .  The patient's history has been reviewed, patient examined, no change in status, stable for surgery.  I have reviewed the patient's chart and labs.  Questions were answered to the patient's satisfaction.     Charles Fields   

## 2018-03-03 NOTE — Anesthesia Procedure Notes (Addendum)
Procedure Name: LMA Insertion Date/Time: 03/03/2018 3:50 PM Performed by: Julian ReilWelty, Jalani Cullifer F, CRNA Pre-anesthesia Checklist: Patient identified, Emergency Drugs available, Suction available, Patient being monitored and Timeout performed Patient Re-evaluated:Patient Re-evaluated prior to induction Oxygen Delivery Method: Circle system utilized Preoxygenation: Pre-oxygenation with 100% oxygen Induction Type: IV induction Ventilation: Mask ventilation without difficulty LMA: LMA inserted LMA Size: 5.0 Tube type: Oral Number of attempts: 2 Placement Confirmation: positive ETCO2 and breath sounds checked- equal and bilateral Tube secured with: Tape Dental Injury: Teeth and Oropharynx as per pre-operative assessment  Comments: LMA 4 with gastric port unable to seat well, large leak.  2nd attempt with Ambu Aurastraight LMA 5, +EtCO2, placed without difficulty.

## 2018-03-03 NOTE — Interval H&P Note (Signed)
History and Physical Interval Note:  03/03/2018 3:07 PM  Jose Bush  has presented today for surgery, with the diagnosis of nonviable tissue right second toe  The various methods of treatment have been discussed with the patient and family. After consideration of risks, benefits and other options for treatment, the patient has consented to  Procedure(s): AMPUTATION RIGHT SECOND TOE (Right) as a surgical intervention .  The patient's history has been reviewed, patient examined, no change in status, stable for surgery.  I have reviewed the patient's chart and labs.  Questions were answered to the patient's satisfaction.     Fabienne Brunsharles Harlin Mazzoni

## 2018-03-03 NOTE — Transfer of Care (Signed)
Immediate Anesthesia Transfer of Care Note  Patient: Jose Bush  Procedure(s) Performed: AMPUTATION RIGHT SECOND TOE (Right )  Patient Location: PACU  Anesthesia Type:General  Level of Consciousness: awake, oriented and patient cooperative  Airway & Oxygen Therapy: Patient Spontanous Breathing and Patient connected to face mask oxygen  Post-op Assessment: Report given to RN and Post -op Vital signs reviewed and stable  Post vital signs: Reviewed and stable  Last Vitals:  Vitals Value Taken Time  BP 132/69 03/03/2018  4:27 PM  Temp    Pulse 71 03/03/2018  4:27 PM  Resp 18 03/03/2018  4:27 PM  SpO2 100 % 03/03/2018  4:27 PM  Vitals shown include unvalidated device data.  Last Pain:  Vitals:   03/03/18 0932  TempSrc:   PainSc: 3       Patients Stated Pain Goal: 3 (03/03/18 0932)  Complications: No apparent anesthesia complications

## 2018-03-03 NOTE — Anesthesia Postprocedure Evaluation (Signed)
Anesthesia Post Note  Patient: Jose Bush  Procedure(s) Performed: AMPUTATION RIGHT SECOND TOE (Right )     Patient location during evaluation: PACU Anesthesia Type: General Level of consciousness: awake and alert Pain management: pain level controlled Vital Signs Assessment: post-procedure vital signs reviewed and stable Respiratory status: spontaneous breathing, nonlabored ventilation and respiratory function stable Cardiovascular status: blood pressure returned to baseline and stable Postop Assessment: no apparent nausea or vomiting Anesthetic complications: no    Last Vitals:  Vitals:   03/03/18 1800 03/03/18 1828  BP: 131/66 131/72  Pulse: 74 71  Resp: 13 14  Temp:  37.2 C  SpO2: 90% 95%    Last Pain:  Vitals:   03/03/18 1828  TempSrc: Oral  PainSc:                  Lowella CurbWarren Ray Miller

## 2018-03-03 NOTE — Anesthesia Preprocedure Evaluation (Signed)
Anesthesia Evaluation  Patient identified by MRN, date of birth, ID band Patient awake    Reviewed: Allergy & Precautions, NPO status , Patient's Chart, lab work & pertinent test results  Airway Mallampati: II  TM Distance: >3 FB Neck ROM: Full    Dental  (+) Edentulous Upper, Edentulous Lower   Pulmonary former smoker,    breath sounds clear to auscultation       Cardiovascular hypertension,  Rhythm:Regular Rate:Normal     Neuro/Psych    GI/Hepatic   Endo/Other    Renal/GU      Musculoskeletal   Abdominal   Peds  Hematology   Anesthesia Other Findings   Reproductive/Obstetrics                             Anesthesia Physical Anesthesia Plan  ASA: III  Anesthesia Plan: General   Post-op Pain Management:    Induction: Intravenous  PONV Risk Score and Plan: Ondansetron  Airway Management Planned: LMA  Additional Equipment:   Intra-op Plan:   Post-operative Plan:   Informed Consent: I have reviewed the patients History and Physical, chart, labs and discussed the procedure including the risks, benefits and alternatives for the proposed anesthesia with the patient or authorized representative who has indicated his/her understanding and acceptance.       Plan Discussed with: CRNA and Anesthesiologist  Anesthesia Plan Comments:         Anesthesia Quick Evaluation  

## 2018-03-03 NOTE — Op Note (Signed)
Procedure: Amputation right second toe with resection of metatarsal head  Preoperative diagnosis: Infection right second toe  Postoperative diagnosis: Same  Anesthesia: Gen.  Asst.: Nurse  Operative findings: Bleeding from skin and surrounding tissues no obvious abscess  Operative details: After obtaining informed consent, the patient was taken the operating room.  The patient was placed in supine position on the operating table.  After induction of general anesthesia and placement of the laryngeal mass, the patient's entire right foot was prepped and draped in usual sterile fashion.  A circumferential incision was made at the base of the right second toe.  The incision was carried all the way down the level of the bone.  The bone was transected with a bone cutter.  The remainder of the proximal phalanx was debrided with Aundria Rudogers.  The metatarsal head was also debrided with rongeurs.  Wound was thoroughly irrigated with normal saline solution.  Tendons were retracted down into the operative field transected and allowed to retract up into the foot.  There was minimal bleeding from the soft tissue or skin edges.  The skin edges were reapproximated using interrupted 3 oh and vertical mattress nylon sutures.  The patient tolerated the procedure well and there were no complications.  The instrument sponge needle count was correct at the end of the case.  The patient was taken to recovery room in stable condition.  Fabienne Brunsharles Fields, MD [facility] [place] Office: [phone] Pager: [phone]

## 2018-03-04 ENCOUNTER — Telehealth: Payer: Self-pay | Admitting: Vascular Surgery

## 2018-03-04 ENCOUNTER — Other Ambulatory Visit: Payer: Self-pay

## 2018-03-04 ENCOUNTER — Encounter (HOSPITAL_COMMUNITY): Payer: Self-pay | Admitting: Vascular Surgery

## 2018-03-04 DIAGNOSIS — I10 Essential (primary) hypertension: Secondary | ICD-10-CM | POA: Diagnosis not present

## 2018-03-04 DIAGNOSIS — I739 Peripheral vascular disease, unspecified: Secondary | ICD-10-CM | POA: Diagnosis not present

## 2018-03-04 DIAGNOSIS — D6859 Other primary thrombophilia: Secondary | ICD-10-CM | POA: Diagnosis not present

## 2018-03-04 DIAGNOSIS — L03031 Cellulitis of right toe: Secondary | ICD-10-CM | POA: Diagnosis not present

## 2018-03-04 DIAGNOSIS — Z79899 Other long term (current) drug therapy: Secondary | ICD-10-CM | POA: Diagnosis not present

## 2018-03-04 DIAGNOSIS — Z7901 Long term (current) use of anticoagulants: Secondary | ICD-10-CM | POA: Diagnosis not present

## 2018-03-04 DIAGNOSIS — Z87891 Personal history of nicotine dependence: Secondary | ICD-10-CM | POA: Diagnosis not present

## 2018-03-04 DIAGNOSIS — M25571 Pain in right ankle and joints of right foot: Secondary | ICD-10-CM | POA: Diagnosis not present

## 2018-03-04 LAB — BASIC METABOLIC PANEL
Anion gap: 8 (ref 5–15)
BUN: 15 mg/dL (ref 6–20)
CHLORIDE: 105 mmol/L (ref 101–111)
CO2: 27 mmol/L (ref 22–32)
CREATININE: 0.8 mg/dL (ref 0.61–1.24)
Calcium: 9 mg/dL (ref 8.9–10.3)
GFR calc non Af Amer: >60 mL/min (ref >60)
Glucose, Bld: 144 mg/dL — ABNORMAL HIGH (ref 65–99)
POTASSIUM: 3.7 mmol/L (ref 3.5–5.1)
SODIUM: 140 mmol/L (ref 135–145)

## 2018-03-04 LAB — CBC
HEMATOCRIT: 33.9 % — AB (ref 39.0–52.0)
Hemoglobin: 11 g/dL — ABNORMAL LOW (ref 13.0–17.0)
MCH: 29.7 pg (ref 26.0–34.0)
MCHC: 32.4 g/dL (ref 30.0–36.0)
MCV: 91.6 fL (ref 78.0–100.0)
Platelets: 236 10*3/uL (ref 150–400)
RBC: 3.7 MIL/uL — AB (ref 4.22–5.81)
RDW: 14 % (ref 11.5–15.5)
WBC: 6.6 10*3/uL (ref 4.0–10.5)

## 2018-03-04 LAB — PROTIME-INR
INR: 1.28
Prothrombin Time: 15.9 seconds — ABNORMAL HIGH (ref 11.4–15.2)

## 2018-03-04 MED ORDER — DOXYCYCLINE HYCLATE 100 MG PO TABS: 100.0000 mg | ORAL_TABLET | Freq: Two times a day (BID) | ORAL | 0 refills | Status: DC

## 2018-03-04 MED ORDER — TRAMADOL HCL 50 MG PO TABS: 50.0000 mg | ORAL_TABLET | Freq: Four times a day (QID) | ORAL | 0 refills | Status: DC | PRN

## 2018-03-04 NOTE — Progress Notes (Signed)
Pt's surgical pain suddenly shot up to a level 10 but refused the morphine available because of a previous adverse reaction.  Dr. Darrick PennaFields was notified and gave a verbal order to discontinue the morphine a place an order for dilaudid 0.5 mg IV every hour as needed for breakthrough pain.  Harriet Massonavidson, Shah Insley E, RN

## 2018-03-04 NOTE — Telephone Encounter (Signed)
sch appt lvm 04/10/18 1245pm p/o MD

## 2018-03-04 NOTE — Progress Notes (Signed)
Patient is being discharged, he is alert and oriented and vital signs are stable. Patient has all of his belongings and his questions regarding his discharge have been satisfied. Patient has his procare shoe and he states that he has a walker at home.  Patient IV has been removed, catheter intact and no complications. He has been removed from telemetry and CCMD has been notified. Patient will be transported home by his wife. He will leave unit by wheelchair and meet wife at main entrance of the hospital.

## 2018-03-04 NOTE — Progress Notes (Signed)
  Progress Note    03/04/2018 7:24 AM 1 Day Post-Op  Subjective:  Minimal pain this morning from amputation site R foot  Vitals:   03/03/18 2135 03/04/18 0354  BP: 124/68 117/69  Pulse: 68 62  Resp: 16 18  Temp: 98.3 F (36.8 C) 97.7 F (36.5 C)  SpO2: 95% 98%   Physical Exam: Lungs:  Non labored breathing Incisions:  Dressing left in place R foot Abdomen:  soft Neurologic: A&O  CBC    Component Value Date/Time   WBC 6.6 03/04/2018 0340   RBC 3.70 (L) 03/04/2018 0340   HGB 11.0 (L) 03/04/2018 0340   HCT 33.9 (L) 03/04/2018 0340   PLT 236 03/04/2018 0340   MCV 91.6 03/04/2018 0340   MCH 29.7 03/04/2018 0340   MCHC 32.4 03/04/2018 0340   RDW 14.0 03/04/2018 0340   LYMPHSABS 0.9 02/05/2013 0640   MONOABS 0.4 02/05/2013 0640   EOSABS 0.0 02/05/2013 0640   BASOSABS 0.0 02/05/2013 0640    BMET    Component Value Date/Time   NA 140 03/04/2018 0340   K 3.7 03/04/2018 0340   CL 105 03/04/2018 0340   CO2 27 03/04/2018 0340   GLUCOSE 144 (H) 03/04/2018 0340   BUN 15 03/04/2018 0340   CREATININE 0.80 03/04/2018 0340   CALCIUM 9.0 03/04/2018 0340   GFRNONAA >60 03/04/2018 0340   GFRAA >60 03/04/2018 0340    INR    Component Value Date/Time   INR 1.28 03/04/2018 0340     Intake/Output Summary (Last 24 hours) at 03/04/2018 0724 Last data filed at 03/03/2018 2224 Gross per 24 hour  Intake 1100 ml  Output 150 ml  Net 950 ml    Assessment/Plan:  77 y.o. male is s/p R 2nd toe amp 1 Day Post-Op   Darco shoe ordered Plan will be for discharge today Prescription for doxycycline 2 weeks given Follow up in office for wound check in 2 weeks   Emilie RutterMatthew Doran Nestle, PA-C [facility] [phone] 03/04/2018 7:24 AM

## 2018-03-04 NOTE — Progress Notes (Signed)
Orthopedic Tech Progress Note Patient Details:  Clearnce HastenWilliam L Ellwood Dec 02, 1940 [id number]  Ortho Devices Type of Ortho Device: Darco shoe Ortho Device/Splint Location: rle Ortho Device/Splint Interventions: Application   Post Interventions Patient Tolerated: Well Instructions Provided: Care of device   Nikki DomCrawford, Sriyan Cutting 03/04/2018, 8:06 AM

## 2018-03-04 NOTE — Care Management Obs Status (Signed)
MEDICARE OBSERVATION STATUS NOTIFICATION   Patient Details  Name: Jose Bush MRN: [id number] Date of Birth: May 03, 1941   Medicare Observation Status Notification Given:  Yes    Darrold SpanWebster, Sherran Margolis Hall, RN 03/04/2018, 9:58 AM

## 2018-03-04 NOTE — Evaluation (Signed)
Physical Therapy Evaluation Patient Details Name: Jose Bush MRN: [id number] DOB: 01/19/1941 Today's Date: 03/04/2018   History of Present Illness  Pt is a 77 y/o male s/p R second toe amputation. PMH includes HTN.   Clinical Impression  Pt is s/p surgery above with deficits below. Pt requiring min to supervision A for mobility with RW this session. Reviewed use of darco shoe during ambulation and precautions following surgery. Pt reports wife will be able to assist him at home and is eager to go home today. Pt does not feel like he needs follow up PT at this time. Will continue to follow acutely to maximize functional mobility independence and safety.     Follow Up Recommendations No PT follow up;Supervision for mobility/OOB    Equipment Recommendations  None recommended by PT    Recommendations for Other Services       Precautions / Restrictions Precautions Precautions: Fall Required Braces or Orthoses: Other Brace/Splint Other Brace/Splint: Darco Shoe Restrictions Weight Bearing Restrictions: No      Mobility  Bed Mobility               General bed mobility comments: In chair upon entry.   Transfers Overall transfer level: Needs assistance Equipment used: Rolling walker (2 wheeled) Transfers: Sit to/from Stand Sit to Stand: Min assist         General transfer comment: Min A for lift assist and steadying. Verbal cues to place weight on heel. Pt reports he has lift chair at home that he uses.   Ambulation/Gait Ambulation/Gait assistance: Min guard;Supervision Ambulation Distance (Feet): 50 Feet Assistive device: Rolling walker (2 wheeled) Gait Pattern/deviations: Step-to pattern;Decreased step length - right;Decreased step length - left;Decreased weight shift to right;Antalgic Gait velocity: Decreased    General Gait Details: Slow, antalgic gati. Verbal cues for sequencing using RW and darco shoe. Min guard to supervision for safety. No overt LOB noted.    Stairs Stairs: Yes       General stair comments: Reviewed LE sequencing for stair navigation with use of rails.   Wheelchair Mobility    Modified Rankin (Stroke Patients Only)       Balance Overall balance assessment: Needs assistance Sitting-balance support: No upper extremity supported;Feet supported Sitting balance-Leahy Scale: Good     Standing balance support: Bilateral upper extremity supported;During functional activity Standing balance-Leahy Scale: Poor Standing balance comment: Reliant on BUE support.                              Pertinent Vitals/Pain Pain Assessment: 0-10 Pain Score: 6  Pain Location: R foot surgery site  Pain Descriptors / Indicators: Aching;Operative site guarding Pain Intervention(s): Limited activity within patient's tolerance;Monitored during session;Repositioned    Home Living Family/patient expects to be discharged to:: Private residence Living Arrangements: Spouse/significant other Available Help at Discharge: Family;Available 24 hours/day Type of Home: House Home Access: Stairs to enter Entrance Stairs-Rails: Doctor, general practice of Steps: 2 Home Layout: One level Home Equipment: Walker - 2 wheels;Cane - single point;Shower seat;Bedside commode      Prior Function Level of Independence: Independent               Hand Dominance   Dominant Hand: Right    Extremity/Trunk Assessment   Upper Extremity Assessment Upper Extremity Assessment: Overall WFL for tasks assessed    Lower Extremity Assessment Lower Extremity Assessment: RLE deficits/detail RLE Deficits / Details: R foot bandaged and in darco  shoe throughout session.     Cervical / Trunk Assessment Cervical / Trunk Assessment: Normal  Communication   Communication: No difficulties  Cognition Arousal/Alertness: Awake/alert Behavior During Therapy: WFL for tasks assessed/performed Overall Cognitive Status: Within Functional Limits  for tasks assessed                                        General Comments General comments (skin integrity, edema, etc.): [name] present during session. Reviewed generalized walking program. Reviewed how to don and doff darco shoe    Exercises     Assessment/Plan    PT Assessment Patient needs continued PT services  PT Problem List Decreased balance;Decreased strength;Decreased mobility;Decreased knowledge of precautions;Pain       PT Treatment Interventions DME instruction;Gait training;Stair training;Functional mobility training;Therapeutic activities;Therapeutic exercise;Balance training;Patient/family education    PT Goals (Current goals can be found in the Care Plan section)  Acute Rehab PT Goals Patient Stated Goal: to go home today PT Goal Formulation: With patient Time For Goal Achievement: 03/18/18 Potential to Achieve Goals: Good    Frequency Min 3X/week   Barriers to discharge        Co-evaluation               AM-PAC PT "6 Clicks" Daily Activity  Outcome Measure Difficulty turning over in bed (including adjusting bedclothes, sheets and blankets)?: A Little Difficulty moving from lying on back to sitting on the side of the bed? : A Little Difficulty sitting down on and standing up from a chair with arms (e.g., wheelchair, bedside commode, etc,.)?: Unable Help needed moving to and from a bed to chair (including a wheelchair)?: A Little Help needed walking in hospital room?: A Little Help needed climbing 3-5 steps with a railing? : A Little 6 Click Score: 16    End of Session Equipment Utilized During Treatment: Gait belt;Other (comment)(R darco shoe ) Activity Tolerance: Patient tolerated treatment well Patient left: in chair;with call bell/phone within reach;with family/visitor present Nurse Communication: Mobility status PT Visit Diagnosis: Other abnormalities of gait and mobility (R26.89);Pain Pain - Right/Left: Right Pain -  part of body: Ankle and joints of foot    Time: [id number] PT Time Calculation (min) (ACUTE ONLY): 21 min   Charges:   PT Evaluation $PT Eval Low Complexity: 1 Low     PT G Codes:        Gladys DammeBrittany Miaisabella Bacorn, PT, DPT  Acute Rehabilitation Services  Pager: [phone]   Lehman PromBrittany S Amai Cappiello 03/04/2018, 11:33 AM

## 2018-03-04 NOTE — Care Management CC44 (Signed)
Condition Code 44 Documentation Completed  Patient Details  Name: Clearnce HastenWilliam L Dickmann MRN: [id number] Date of Birth: Jul 05, 1941   Condition Code 44 given:  Yes Patient signature on Condition Code 44 notice:  Yes Documentation of 2 MD's agreement:  Yes Code 44 added to claim:  Yes    Darrold SpanWebster, Xzavian Semmel Hall, RN 03/04/2018, 9:58 AM

## 2018-03-05 DIAGNOSIS — I82503 Chronic embolism and thrombosis of unspecified deep veins of lower extremity, bilateral: Secondary | ICD-10-CM | POA: Diagnosis not present

## 2018-03-05 NOTE — Discharge Summary (Signed)
Physician Discharge Summary   Patient ID: Clearnce HastenWilliam L Espinoza [id number] 77 y.o. [birth year]  Admit date: 03/03/2018  Discharge date and time: 03/04/2018  1:47 PM   Admitting Physician: Sherren Kernsharles E Fields, MD   Discharge Physician: same  Admission Diagnoses: PVD (peripheral vascular disease) [facility](HCC) [I73.9]  Discharge Diagnoses: same  Admission Condition: fair  Discharged Condition: fair  Indication for Admission: infection R second toe  Hospital Course: [name] is a 77 year old male who came in as an outpatient for amputation of right second toe with resection of metatarsal head by Dr. Darrick PennaFields on 03/03/2018 due to infection.  He tolerated the procedure well and was admitted overnight for observation.  POD #1 patient was provided with Darco shoe for ambulation.  Operative dressing was left in place.  Wound care instructions were provided to the patient and his wife.  He will be discharged with 2 weeks of doxycycline 100 mg twice daily.  He will resume his home dose of Coumadin.  He will follow-up in office for wound check in 2 weeks.  Discharge instructions were reviewed with the patient and his wife and they voiced understanding.  He was discharged on postoperative day #1 to home in stable condition.  Consults: None  Treatments: surgery: Right second toe amputation by Dr. Darrick PennaFields on 03/03/2018  Discharge Exam: See progress note 03/04/2018 Vitals:   03/03/18 2135 03/04/18 0354  BP: 124/68 117/69  Pulse: 68 62  Resp: 16 18  Temp: 98.3 F (36.8 C) 97.7 F (36.5 C)  SpO2: 95% 98%      Disposition: home  Patient Instructions:  Allergies as of 03/04/2018   No Known Allergies     Medication List    STOP taking these medications   doxycycline 100 MG capsule Commonly known as:  VIBRAMYCIN Replaced by:  doxycycline 100 MG tablet     TAKE these medications   acetaminophen 500 MG tablet Commonly known as:  TYLENOL Take 500 mg by mouth every 6 (six) hours as needed for moderate pain or  headache.   docusate sodium 100 MG capsule Commonly known as:  COLACE Take 100 mg by mouth daily.   doxycycline 100 MG tablet Commonly known as:  VIBRA-TABS Take 1 tablet (100 mg total) by mouth 2 (two) times daily for 14 days. Replaces:  doxycycline 100 MG capsule   ESTER C PO Take 500 mg by mouth daily.   hydroxychloroquine 200 MG tablet Commonly known as:  PLAQUENIL Take 200 mg by mouth 2 (two) times daily.   ICY HOT EX Apply 1 application topically 2 (two) times daily as needed (left knee.).   lisinopril-hydrochlorothiazide 10-12.5 MG tablet Commonly known as:  PRINZIDE,ZESTORETIC Take 1 tablet by mouth daily with breakfast.   multivitamin with minerals Tabs tablet Take 1 tablet by mouth daily. Centrum Silver   NITROSTAT 0.4 MG SL tablet Generic drug:  nitroGLYCERIN Place 0.4 mg under the tongue every 5 (five) minutes as needed for chest pain. x3 doses as needed for chest pain   OMEGA-3 KRILL OIL PO Take 1 capsule by mouth daily.   omeprazole 20 MG capsule Commonly known as:  PRILOSEC Take 20 mg by mouth daily.   traMADol 50 MG tablet Commonly known as:  ULTRAM Take 1 tablet (50 mg total) by mouth every 6 (six) hours as needed. What changed:    how much to take  how to take this  when to take this  reasons to take this   URINOZINC PO Take 1 capsule by  mouth 2 (two) times daily.   warfarin 5 MG tablet Commonly known as:  COUMADIN Take 1 tablet (5 mg total) by mouth every morning. Take Coumadin for three weeks for postoperative protocol and then the patient may resume their previous Coumadin home regimen.  The dose may need to be adjusted based upon the INR.  Please follow the INR and titrate Coumadin dose for a therapeutic range between 2.0 and 3.0 INR.  After completing the three weeks of Coumadin, the patient may resume their previous Coumadin home regimen. What changed:    how much to take  when to take this  additional instructions       Activity: activity as tolerated; ambulate with darco shoe Diet: regular diet Wound Care: keep wound clean and dry  Follow-up with Dr. Darrick Penna in 2 weeks.  SignedEmilie Rutter 03/05/2018 8:32 AM

## 2018-03-10 DIAGNOSIS — I82503 Chronic embolism and thrombosis of unspecified deep veins of lower extremity, bilateral: Secondary | ICD-10-CM | POA: Diagnosis not present

## 2018-03-10 DIAGNOSIS — I739 Peripheral vascular disease, unspecified: Secondary | ICD-10-CM | POA: Diagnosis not present

## 2018-03-10 DIAGNOSIS — I82499 Acute embolism and thrombosis of other specified deep vein of unspecified lower extremity: Secondary | ICD-10-CM | POA: Diagnosis not present

## 2018-03-11 NOTE — Consult Note (Signed)
            [facility]igator  03/11/2018  Jose [name] [birth year] [id number]   Attempt to seepatient at the bedsideto identify possible discharge needs but hewas alreadydischargedhome.   PerMD note,patientcame in as an outpatient for amputation of right second toe with resection of metatarsal head on 03/03/2018 due to infection.  He tolerated the procedure well and was admitted overnight for observation. (peripheral vascular disease)  Primary care provider's office is listed as providing transition of care (TOC) follow-up.  Patient has discharge instruction to follow-up withvascular surgery (Dr. Darrick PennaFields) in 2 weeks.   For additional questions please contact:  Karin GoldenLorraine A. Lluvia Gwynne, BSN, RN-BC [facility] PRIMARY [facility] Navigator Cell: [phone]

## 2018-03-12 ENCOUNTER — Other Ambulatory Visit: Payer: Self-pay

## 2018-03-12 ENCOUNTER — Ambulatory Visit: Payer: Medicare HMO | Admitting: Physician Assistant

## 2018-03-12 ENCOUNTER — Encounter: Payer: Self-pay | Admitting: Physician Assistant

## 2018-03-12 ENCOUNTER — Telehealth: Payer: Self-pay | Admitting: *Deleted

## 2018-03-12 VITALS — BP 141/72 | HR 64 | Temp 99.3°F | Resp 20 | Ht 68.0 in | Wt 208.0 lb

## 2018-03-12 DIAGNOSIS — I739 Peripheral vascular disease, unspecified: Secondary | ICD-10-CM

## 2018-03-12 DIAGNOSIS — T8189XA Other complications of procedures, not elsewhere classified, initial encounter: Secondary | ICD-10-CM

## 2018-03-12 MED ORDER — OXYCODONE HCL 5 MG PO TABS: 5.0000 mg | ORAL_TABLET | Freq: Four times a day (QID) | ORAL | 0 refills | Status: DC | PRN

## 2018-03-12 NOTE — Progress Notes (Signed)
Postoperative Visit (Angio)   History of Present Illness   Jose Bush is a 77 y.o. male who presents to clinic today due to increase in pain and redness in right foot.  Patient is status post right lower extremity arteriogram with balloon angioplasty of posterior tibial artery by Dr. Randie Heinz on 02/17/2018 and subsequent second toe amputation with Dr. Darrick Penna on 03/03/2018.  Past medical history significant for lupus for which he takes immunosuppressive agent Plaquenil.  He was discharged on 03/05/2018 with a doxycycline regimen which she has been taking religiously.  Over the last several days patient has noticed an increase in pain to his right foot as well as redness on the forefoot and concurrent thin clear drainage from amputation site.  Patient denies fevers, chills, loss of appetite, nausea/vomiting.  Patient is on Coumadin for history of DVT and left lower extremity.  He continues to ambulate using a Darco shoe.   Current Outpatient Medications  Medication Sig Dispense Refill  . acetaminophen (TYLENOL) 500 MG tablet Take 500 mg by mouth every 6 (six) hours as needed for moderate pain or headache.     Marland Kitchen Bioflavonoid Products (ESTER C PO) Take 500 mg by mouth daily.    Marland Kitchen docusate sodium (COLACE) 100 MG capsule Take 100 mg by mouth daily.     Marland Kitchen doxycycline (VIBRA-TABS) 100 MG tablet Take 1 tablet (100 mg total) by mouth 2 (two) times daily for 14 days. 28 tablet 0  . hydroxychloroquine (PLAQUENIL) 200 MG tablet Take 200 mg by mouth 2 (two) times daily.    Marland Kitchen lisinopril-hydrochlorothiazide (PRINZIDE,ZESTORETIC) 10-12.5 MG per tablet Take 1 tablet by mouth daily with breakfast.     . Menthol, Topical Analgesic, (ICY HOT EX) Apply 1 application topically 2 (two) times daily as needed (left knee.).     Marland Kitchen Misc Natural Products (URINOZINC PO) Take 1 capsule by mouth 2 (two) times daily.     . Multiple Vitamin (MULTIVITAMIN WITH MINERALS) TABS tablet Take 1 tablet by mouth daily. Centrum Silver    .  nitroGLYCERIN (NITROSTAT) 0.4 MG SL tablet Place 0.4 mg under the tongue every 5 (five) minutes as needed for chest pain. x3 doses as needed for chest pain    . OMEGA-3 KRILL OIL PO Take 1 capsule by mouth daily.    Marland Kitchen omeprazole (PRILOSEC) 20 MG capsule Take 20 mg by mouth daily.    . traMADol (ULTRAM) 50 MG tablet Take 1 tablet (50 mg total) by mouth every 6 (six) hours as needed. 20 tablet 0  . warfarin (COUMADIN) 5 MG tablet Take 1 tablet (5 mg total) by mouth every morning. Take Coumadin for three weeks for postoperative protocol and then the patient may resume their previous Coumadin home regimen.  The dose may need to be adjusted based upon the INR.  Please follow the INR and titrate Coumadin dose for a therapeutic range between 2.0 and 3.0 INR.  After completing the three weeks of Coumadin, the patient may resume their previous Coumadin home regimen. (Patient taking differently: Take 5-10 mg by mouth See admin instructions. Take 10 mg by mouth daily on Wednesday and take 5 mg by mouth daily on all other days) 35 tablet 0  . oxyCODONE (OXY IR/ROXICODONE) 5 MG immediate release tablet Take 1 tablet (5 mg total) by mouth every 6 (six) hours as needed. 6 tablet 0   No current facility-administered medications for this visit.     ROS: ROS is negative unless otherwise noted  in HPI   For VQI Use Only   PRE-ADM LIVING: Home  AMB STATUS: Ambulatory   Physical Examination   Vitals:   03/12/18 1517  BP: (!) 141/72  Pulse: 64  Resp: 20  Temp: 99.3 F (37.4 C)  TempSrc: Oral  SpO2: 100%  Weight: 208 lb (94.3 kg)  Height: 5\' 8"  (1.727 m)   Body mass index is 31.63 kg/m.  General Alert, O x 3, WD, NAD  Pulmonary Sym exp, good B air movt,   Cardiac RRR, Nl S1, S2,   Gastrointestinal soft, non-distended,  Musculoskeletal  right second toe amputation site somewhat macerated in appearance no drainage with manipulation however sensitive to touch; erythema moderate intensity right  forefoot; brisk DP signal and distal foot by Doppler; PTA by Doppler proximal to medial malleolus; brisk peroneal signal by Doppler proximal to lateral malleolus  Neurologic A&O    Medical Decision Making   Clearnce [name] is a 77 y.o. male who presents for wound check s/p R 2nd toe amputation   Nonhealing second toe amputation site likely due to immunosuppression  Right leg with three-vessel runoff to the ankle and dopplerable DP signal  Patient exhibits no signs of sepsis currently; continue doxycycline  Prescribed 1 day of roxicodone 5mg  to help with pain; he will hold his tramadol tonight  He will be evaluated by Dr. Darrick PennaFields tomorrow to determine treatment plan   Emilie RutterMatthew Ruble Pumphrey, PA-C [facility] [place] Office: [phone]

## 2018-03-12 NOTE — H&P (View-Only) (Signed)
Postoperative Visit (Angio)   History of Present Illness   Jose Bush is a 77 y.o. male who presents to clinic today due to increase in pain and redness in right foot.  Patient is status post right lower extremity arteriogram with balloon angioplasty of posterior tibial artery by Dr. Randie Heinz on 02/17/2018 and subsequent second toe amputation with Dr. Darrick Penna on 03/03/2018.  Past medical history significant for lupus for which he takes immunosuppressive agent Plaquenil.  He was discharged on 03/05/2018 with a doxycycline regimen which she has been taking religiously.  Over the last several days patient has noticed an increase in pain to his right foot as well as redness on the forefoot and concurrent thin clear drainage from amputation site.  Patient denies fevers, chills, loss of appetite, nausea/vomiting.  Patient is on Coumadin for history of DVT and left lower extremity.  He continues to ambulate using a Darco shoe.   Current Outpatient Medications  Medication Sig Dispense Refill  . acetaminophen (TYLENOL) 500 MG tablet Take 500 mg by mouth every 6 (six) hours as needed for moderate pain or headache.     Marland Kitchen Bioflavonoid Products (ESTER C PO) Take 500 mg by mouth daily.    Marland Kitchen docusate sodium (COLACE) 100 MG capsule Take 100 mg by mouth daily.     Marland Kitchen doxycycline (VIBRA-TABS) 100 MG tablet Take 1 tablet (100 mg total) by mouth 2 (two) times daily for 14 days. 28 tablet 0  . hydroxychloroquine (PLAQUENIL) 200 MG tablet Take 200 mg by mouth 2 (two) times daily.    Marland Kitchen lisinopril-hydrochlorothiazide (PRINZIDE,ZESTORETIC) 10-12.5 MG per tablet Take 1 tablet by mouth daily with breakfast.     . Menthol, Topical Analgesic, (ICY HOT EX) Apply 1 application topically 2 (two) times daily as needed (left knee.).     Marland Kitchen Misc Natural Products (URINOZINC PO) Take 1 capsule by mouth 2 (two) times daily.     . Multiple Vitamin (MULTIVITAMIN WITH MINERALS) TABS tablet Take 1 tablet by mouth daily. Centrum Silver    .  nitroGLYCERIN (NITROSTAT) 0.4 MG SL tablet Place 0.4 mg under the tongue every 5 (five) minutes as needed for chest pain. x3 doses as needed for chest pain    . OMEGA-3 KRILL OIL PO Take 1 capsule by mouth daily.    Marland Kitchen omeprazole (PRILOSEC) 20 MG capsule Take 20 mg by mouth daily.    . traMADol (ULTRAM) 50 MG tablet Take 1 tablet (50 mg total) by mouth every 6 (six) hours as needed. 20 tablet 0  . warfarin (COUMADIN) 5 MG tablet Take 1 tablet (5 mg total) by mouth every morning. Take Coumadin for three weeks for postoperative protocol and then the patient may resume their previous Coumadin home regimen.  The dose may need to be adjusted based upon the INR.  Please follow the INR and titrate Coumadin dose for a therapeutic range between 2.0 and 3.0 INR.  After completing the three weeks of Coumadin, the patient may resume their previous Coumadin home regimen. (Patient taking differently: Take 5-10 mg by mouth See admin instructions. Take 10 mg by mouth daily on Wednesday and take 5 mg by mouth daily on all other days) 35 tablet 0  . oxyCODONE (OXY IR/ROXICODONE) 5 MG immediate release tablet Take 1 tablet (5 mg total) by mouth every 6 (six) hours as needed. 6 tablet 0   No current facility-administered medications for this visit.     ROS: ROS is negative unless otherwise noted  in HPI   For VQI Use Only   PRE-ADM LIVING: Home  AMB STATUS: Ambulatory   Physical Examination   Vitals:   03/12/18 1517  BP: (!) 141/72  Pulse: 64  Resp: 20  Temp: 99.3 F (37.4 C)  TempSrc: Oral  SpO2: 100%  Weight: 208 lb (94.3 kg)  Height: 5\' 8"  (1.727 m)   Body mass index is 31.63 kg/m.  General Alert, O x 3, WD, NAD  Pulmonary Sym exp, good B air movt,   Cardiac RRR, Nl S1, S2,   Gastrointestinal soft, non-distended,  Musculoskeletal  right second toe amputation site somewhat macerated in appearance no drainage with manipulation however sensitive to touch; erythema moderate intensity right  forefoot; brisk DP signal and distal foot by Doppler; PTA by Doppler proximal to medial malleolus; brisk peroneal signal by Doppler proximal to lateral malleolus  Neurologic A&O    Medical Decision Making   Jose Bush is a 77 y.o. male who presents for wound check s/p R 2nd toe amputation   Nonhealing second toe amputation site likely due to immunosuppression  Right leg with three-vessel runoff to the ankle and dopplerable DP signal  Patient exhibits no signs of sepsis currently; continue doxycycline  Prescribed 1 day of roxicodone 5mg  to help with pain; he will hold his tramadol tonight  He will be evaluated by Dr. Darrick PennaFields tomorrow to determine treatment plan   Jose RutterMatthew Kahlani Graber, PA-C [facility] [place] Office: [phone]

## 2018-03-12 NOTE — Telephone Encounter (Signed)
Patient's wife called to report that Jose Bush is having increase pain and swelling in his right foot x 2 days, today he has red streaks going up his leg. Wife thinks he is afebrile but doesn't know for sure. He is S/P right second toe amputation on 03-03-18 by Dr. Darrick PennaFields and had aortogram 02-17-18 by Dr. Randie Heinzain. I have asked them to come in to the office this afternoon to see our PA.

## 2018-03-13 ENCOUNTER — Encounter: Payer: Self-pay | Admitting: *Deleted

## 2018-03-13 ENCOUNTER — Encounter: Payer: Self-pay | Admitting: Vascular Surgery

## 2018-03-13 ENCOUNTER — Other Ambulatory Visit: Payer: Self-pay | Admitting: *Deleted

## 2018-03-13 ENCOUNTER — Ambulatory Visit (INDEPENDENT_AMBULATORY_CARE_PROVIDER_SITE_OTHER): Payer: Self-pay | Admitting: Vascular Surgery

## 2018-03-13 VITALS — BP 129/75 | HR 72 | Temp 97.7°F | Resp 16 | Ht 68.0 in | Wt 207.4 lb

## 2018-03-13 DIAGNOSIS — I739 Peripheral vascular disease, unspecified: Secondary | ICD-10-CM

## 2018-03-13 DIAGNOSIS — T8189XA Other complications of procedures, not elsewhere classified, initial encounter: Secondary | ICD-10-CM

## 2018-03-13 NOTE — Progress Notes (Addendum)
Established Previous Bypass   History of Present Illness   QUENTON RECENDEZ is a 77 y.o. ([birth year]) male who returns to clinic for wound check of right foot.  He is status post right leg arteriogram with balloon angioplasty of posterior tibial artery by Dr. Randie Heinz on 02/17/2018.  He then had second toe amputation by Dr. Darrick Penna on 03/03/2018.  Since discharge from the hospital on 6/5 he has had increasing redness and drainage from second toe amputation site.  He was encouraged to elevate his leg to improve edema as patient also has venous insufficiency.  He however is unable to elevate his leg due to the exacerbation of rest pain when this is done.  He is taking immunosuppressive medication due to lupus.  He currently denies any fevers, chills, loss of appetite, nausea/vomiting.  Narcotic pain medication helps patient sleep last night.  He is also taking Coumadin for history of left leg DVT.  Current Outpatient Medications  Medication Sig Dispense Refill  . acetaminophen (TYLENOL) 500 MG tablet Take 500 mg by mouth every 6 (six) hours as needed for moderate pain or headache.     Marland Kitchen Bioflavonoid Products (ESTER C PO) Take 500 mg by mouth daily.    Marland Kitchen docusate sodium (COLACE) 100 MG capsule Take 100 mg by mouth daily.     Marland Kitchen doxycycline (VIBRA-TABS) 100 MG tablet Take 1 tablet (100 mg total) by mouth 2 (two) times daily for 14 days. 28 tablet 0  . hydroxychloroquine (PLAQUENIL) 200 MG tablet Take 200 mg by mouth 2 (two) times daily.    Marland Kitchen lisinopril-hydrochlorothiazide (PRINZIDE,ZESTORETIC) 10-12.5 MG per tablet Take 1 tablet by mouth daily with breakfast.     . Menthol, Topical Analgesic, (ICY HOT EX) Apply 1 application topically 2 (two) times daily as needed (left knee.).     Marland Kitchen Misc Natural Products (URINOZINC PO) Take 1 capsule by mouth 2 (two) times daily.     . Multiple Vitamin (MULTIVITAMIN WITH MINERALS) TABS tablet Take 1 tablet by mouth daily. Centrum Silver    . nitroGLYCERIN (NITROSTAT) 0.4  MG SL tablet Place 0.4 mg under the tongue every 5 (five) minutes as needed for chest pain. x3 doses as needed for chest pain    . OMEGA-3 KRILL OIL PO Take 1 capsule by mouth daily.    Marland Kitchen omeprazole (PRILOSEC) 20 MG capsule Take 20 mg by mouth daily.    Marland Kitchen oxyCODONE (OXY IR/ROXICODONE) 5 MG immediate release tablet Take 1 tablet (5 mg total) by mouth every 6 (six) hours as needed. 6 tablet 0  . traMADol (ULTRAM) 50 MG tablet Take 1 tablet (50 mg total) by mouth every 6 (six) hours as needed. 20 tablet 0  . warfarin (COUMADIN) 5 MG tablet Take 1 tablet (5 mg total) by mouth every morning. Take Coumadin for three weeks for postoperative protocol and then the patient may resume their previous Coumadin home regimen.  The dose may need to be adjusted based upon the INR.  Please follow the INR and titrate Coumadin dose for a therapeutic range between 2.0 and 3.0 INR.  After completing the three weeks of Coumadin, the patient may resume their previous Coumadin home regimen. (Patient taking differently: Take 5-10 mg by mouth See admin instructions. Take 10 mg by mouth daily on Wednesday and take 5 mg by mouth daily on all other days) 35 tablet 0   No current facility-administered medications for this visit.     On ROS today: 10 system ROS  is negative unless otherwise noted in HPI   Physical Examination   Vitals:   03/13/18 1422  BP: 129/75  Pulse: 72  Resp: 16  Temp: 97.7 F (36.5 C)  TempSrc: Oral  SpO2: 97%  Weight: 207 lb 6.4 oz (94.1 kg)  Height: 5\' 8"  (1.727 m)   Body mass index is 31.54 kg/m.  General Alert, O x 3, WD, NAD  Pulmonary Sym exp, good B air movt, CTA B  Cardiac RRR, Nl S1, S2,   Vascular Vessel Right Left  Radial Palpable Palpable  Popliteal Not palpable Not palpable  PT PT signal by doppler proximal to medial mallelus Not palpable  DP dopplerable Not palpable    Gastro- intestinal soft, non-distended,   Musculo- skeletal M/S 5/5 throughout  , Extremities with  ischemic changes  , Pitting edema present: RLE, nonhealing right second toe amputation site with thin yellowish discharge; erythema extending over forefoot  Neurologic Pain and light touch intact in extremities , Motor exam as listed above     Medical Decision Making   Clearnce HastenWilliam L Kuhlmann is a 77 y.o. male who presents with nonhealing R 2nd toe amputation site   Nonhealing toe amp site with chronic tibial arterial disease; also likely related to immunosuppression for lupus  Dr. Darrick PennaFields offered R TMA vs BKA; 50% chance of healing quoted for TMA  Patient and wife elected to proceed with R BKA on Tuesday 03/18/18  Last dose of coumadin will be tomorrow 03/14/18   Emilie RutterMatthew Nysir Fergusson PA-C [facility] [place] Office: [phone]  History and exam details as above.  Patient has severe tibial occlusive disease.  The toe amputation site is not healing.  He has inflammation erythema in the foot suggesting possible early infection of the left foot.  I offered the patient a transmetatarsal amputation today that I did not think would easily heal.  Other option would be a below-knee amputation and I quoted him 5 to 10% risk that this may not heal as well since he has such significant inflammation and swelling in his left leg.  At this point he is opted for a below-knee amputation.  We have scheduled this for Tuesday, March 18, 2018.  Other possible risks include myocardial events wound infection nonhealing possible requirement for other procedures.  I did discuss with his rheumatologist stopping his Plaquenil.  However she informed me that Plaquenil should not inhibit any wound healing.  He is not currently on any steroids so hopefully will not have wound healing problems secondary to his medications.  Fabienne Brunsharles Fields, MD [facility] [place] Office: [phone] Pager: [phone]

## 2018-03-14 ENCOUNTER — Ambulatory Visit: Payer: Medicare HMO

## 2018-03-17 ENCOUNTER — Other Ambulatory Visit: Payer: Self-pay

## 2018-03-17 ENCOUNTER — Encounter (HOSPITAL_COMMUNITY): Payer: Self-pay | Admitting: *Deleted

## 2018-03-17 NOTE — Progress Notes (Signed)
Pt denies SOB, chest pain, and being under the care of a cardiologist. Pt denies having a stress test, and cardiac cath. Pt denies having a chest x ray within the last year. Pt requested that spouse complete SDW-pre-op assessment. Spouse stated that pt last dose of Coumadin was taken Friday as instructed. Spouse made aware to have pt to stop taking vitamins, Ester C,  fish oil, Krill oil, and herbal medications. Do not take any NSAIDs ie: Ibuprofen, Advil, Naproxen (Aleve), Motrin, BC and Goody Powder. Spouse verbalized understanding of all pre-op instructions.

## 2018-03-18 ENCOUNTER — Encounter (HOSPITAL_COMMUNITY)
Admission: RE | Disposition: A | Discharge status: Skilled Nursing Facility | Payer: Self-pay | Source: Home / Self Care | Attending: Vascular Surgery

## 2018-03-18 ENCOUNTER — Inpatient Hospital Stay (HOSPITAL_COMMUNITY): Payer: Medicare HMO | Admitting: Anesthesiology

## 2018-03-18 ENCOUNTER — Other Ambulatory Visit: Payer: Self-pay

## 2018-03-18 ENCOUNTER — Inpatient Hospital Stay (HOSPITAL_COMMUNITY): Payer: Medicare HMO

## 2018-03-18 ENCOUNTER — Encounter (HOSPITAL_COMMUNITY): Payer: Self-pay | Admitting: Urology

## 2018-03-18 ENCOUNTER — Inpatient Hospital Stay (HOSPITAL_COMMUNITY)
Admission: RE | Admit: 2018-03-18 | Discharge: 2018-03-24 | DRG: 240 | Disposition: A | Discharge status: Skilled Nursing Facility | Payer: Medicare HMO | Attending: Vascular Surgery | Admitting: Vascular Surgery

## 2018-03-18 DIAGNOSIS — Z87891 Personal history of nicotine dependence: Secondary | ICD-10-CM | POA: Diagnosis not present

## 2018-03-18 DIAGNOSIS — D649 Anemia, unspecified: Secondary | ICD-10-CM | POA: Diagnosis not present

## 2018-03-18 DIAGNOSIS — I70235 Atherosclerosis of native arteries of right leg with ulceration of other part of foot: Secondary | ICD-10-CM | POA: Diagnosis not present

## 2018-03-18 DIAGNOSIS — Z8249 Family history of ischemic heart disease and other diseases of the circulatory system: Secondary | ICD-10-CM

## 2018-03-18 DIAGNOSIS — R338 Other retention of urine: Secondary | ICD-10-CM | POA: Diagnosis not present

## 2018-03-18 DIAGNOSIS — M329 Systemic lupus erythematosus, unspecified: Secondary | ICD-10-CM | POA: Diagnosis not present

## 2018-03-18 DIAGNOSIS — N401 Enlarged prostate with lower urinary tract symptoms: Secondary | ICD-10-CM | POA: Diagnosis present

## 2018-03-18 DIAGNOSIS — I998 Other disorder of circulatory system: Secondary | ICD-10-CM | POA: Diagnosis present

## 2018-03-18 DIAGNOSIS — L97413 Non-pressure chronic ulcer of right heel and midfoot with necrosis of muscle: Secondary | ICD-10-CM | POA: Diagnosis not present

## 2018-03-18 DIAGNOSIS — I10 Essential (primary) hypertension: Secondary | ICD-10-CM | POA: Diagnosis not present

## 2018-03-18 DIAGNOSIS — Z823 Family history of stroke: Secondary | ICD-10-CM | POA: Diagnosis not present

## 2018-03-18 DIAGNOSIS — S88111A Complete traumatic amputation at level between knee and ankle, right lower leg, initial encounter: Secondary | ICD-10-CM

## 2018-03-18 DIAGNOSIS — Z96653 Presence of artificial knee joint, bilateral: Secondary | ICD-10-CM | POA: Diagnosis present

## 2018-03-18 DIAGNOSIS — R2689 Other abnormalities of gait and mobility: Secondary | ICD-10-CM | POA: Diagnosis not present

## 2018-03-18 DIAGNOSIS — S91301A Unspecified open wound, right foot, initial encounter: Secondary | ICD-10-CM | POA: Diagnosis not present

## 2018-03-18 DIAGNOSIS — S81801A Unspecified open wound, right lower leg, initial encounter: Secondary | ICD-10-CM

## 2018-03-18 DIAGNOSIS — L97519 Non-pressure chronic ulcer of other part of right foot with unspecified severity: Secondary | ICD-10-CM | POA: Diagnosis not present

## 2018-03-18 DIAGNOSIS — Z7901 Long term (current) use of anticoagulants: Secondary | ICD-10-CM

## 2018-03-18 DIAGNOSIS — K219 Gastro-esophageal reflux disease without esophagitis: Secondary | ICD-10-CM | POA: Diagnosis not present

## 2018-03-18 DIAGNOSIS — M2550 Pain in unspecified joint: Secondary | ICD-10-CM | POA: Diagnosis not present

## 2018-03-18 DIAGNOSIS — Z96641 Presence of right artificial hip joint: Secondary | ICD-10-CM | POA: Diagnosis present

## 2018-03-18 DIAGNOSIS — R509 Fever, unspecified: Secondary | ICD-10-CM

## 2018-03-18 DIAGNOSIS — I70269 Atherosclerosis of native arteries of extremities with gangrene, unspecified extremity: Secondary | ICD-10-CM

## 2018-03-18 DIAGNOSIS — Z89511 Acquired absence of right leg below knee: Secondary | ICD-10-CM | POA: Diagnosis not present

## 2018-03-18 DIAGNOSIS — M6281 Muscle weakness (generalized): Secondary | ICD-10-CM | POA: Diagnosis not present

## 2018-03-18 DIAGNOSIS — Z7401 Bed confinement status: Secondary | ICD-10-CM | POA: Diagnosis not present

## 2018-03-18 DIAGNOSIS — D509 Iron deficiency anemia, unspecified: Secondary | ICD-10-CM | POA: Diagnosis not present

## 2018-03-18 DIAGNOSIS — N400 Enlarged prostate without lower urinary tract symptoms: Secondary | ICD-10-CM | POA: Diagnosis not present

## 2018-03-18 DIAGNOSIS — I739 Peripheral vascular disease, unspecified: Secondary | ICD-10-CM | POA: Diagnosis not present

## 2018-03-18 DIAGNOSIS — I70261 Atherosclerosis of native arteries of extremities with gangrene, right leg: Secondary | ICD-10-CM | POA: Diagnosis not present

## 2018-03-18 DIAGNOSIS — Z4781 Encounter for orthopedic aftercare following surgical amputation: Secondary | ICD-10-CM | POA: Diagnosis not present

## 2018-03-18 DIAGNOSIS — D62 Acute posthemorrhagic anemia: Secondary | ICD-10-CM | POA: Diagnosis not present

## 2018-03-18 DIAGNOSIS — Z86718 Personal history of other venous thrombosis and embolism: Secondary | ICD-10-CM

## 2018-03-18 DIAGNOSIS — T8781 Dehiscence of amputation stump: Secondary | ICD-10-CM | POA: Diagnosis not present

## 2018-03-18 DIAGNOSIS — I96 Gangrene, not elsewhere classified: Secondary | ICD-10-CM | POA: Diagnosis not present

## 2018-03-18 DIAGNOSIS — S81809A Unspecified open wound, unspecified lower leg, initial encounter: Secondary | ICD-10-CM | POA: Diagnosis present

## 2018-03-18 HISTORY — PX: AMPUTATION: SHX166

## 2018-03-18 HISTORY — PX: BELOW KNEE LEG AMPUTATION: SUR23

## 2018-03-18 LAB — URINALYSIS, ROUTINE W REFLEX MICROSCOPIC
Bilirubin Urine: NEGATIVE
GLUCOSE, UA: NEGATIVE mg/dL
Hgb urine dipstick: NEGATIVE
Ketones, ur: NEGATIVE mg/dL
LEUKOCYTES UA: NEGATIVE
Nitrite: NEGATIVE
Protein, ur: NEGATIVE mg/dL
SPECIFIC GRAVITY, URINE: 1.012 (ref 1.005–1.030)
pH: 6 (ref 5.0–8.0)

## 2018-03-18 LAB — BASIC METABOLIC PANEL
Anion gap: 7 (ref 5–15)
BUN: 11 mg/dL (ref 6–20)
CHLORIDE: 106 mmol/L (ref 101–111)
CO2: 26 mmol/L (ref 22–32)
Calcium: 9 mg/dL (ref 8.9–10.3)
Creatinine, Ser: 0.7 mg/dL (ref 0.61–1.24)
GFR calc Af Amer: >60 mL/min (ref >60)
GFR calc non Af Amer: >60 mL/min (ref >60)
Glucose, Bld: 85 mg/dL (ref 65–99)
POTASSIUM: 3.8 mmol/L (ref 3.5–5.1)
Sodium: 139 mmol/L (ref 135–145)

## 2018-03-18 LAB — PROTIME-INR
INR: 1.54
Prothrombin Time: 18.3 s — ABNORMAL HIGH (ref 11.4–15.2)

## 2018-03-18 LAB — CBC
HCT: 34.6 % — ABNORMAL LOW (ref 39.0–52.0)
Hemoglobin: 10.8 g/dL — ABNORMAL LOW (ref 13.0–17.0)
MCH: 28.4 pg (ref 26.0–34.0)
MCHC: 31.2 g/dL (ref 30.0–36.0)
MCV: 91.1 fL (ref 78.0–100.0)
Platelets: 229 10*3/uL (ref 150–400)
RBC: 3.8 MIL/uL — ABNORMAL LOW (ref 4.22–5.81)
RDW: 13.8 % (ref 11.5–15.5)
WBC: 7 10*3/uL (ref 4.0–10.5)

## 2018-03-18 LAB — APTT: APTT: 46 s — AB (ref 24–36)

## 2018-03-18 SURGERY — AMPUTATION BELOW KNEE
Anesthesia: General | Site: Leg Lower | Laterality: Right

## 2018-03-18 MED ORDER — ONDANSETRON HCL 4 MG/2ML IJ SOLN
INTRAMUSCULAR | Status: DC | PRN
Start: 2018-03-18 — End: 2018-03-18
  Administered 2018-03-18: 4 mg via INTRAVENOUS

## 2018-03-18 MED ORDER — CEFAZOLIN SODIUM-DEXTROSE 2-4 GM/100ML-% IV SOLN
2.0000 g | INTRAVENOUS | Status: AC
  Administered 2018-03-18: 2 g via INTRAVENOUS

## 2018-03-18 MED ORDER — ACETAMINOPHEN 325 MG PO TABS
325.0000 mg | ORAL_TABLET | ORAL | Status: DC | PRN
  Administered 2018-03-18 – 2018-03-24 (×17): 650 mg via ORAL
  Filled 2018-03-18 (×17): qty 2

## 2018-03-18 MED ORDER — MORPHINE SULFATE (PF) 2 MG/ML IV SOLN
2.0000 mg | INTRAVENOUS | Status: DC | PRN
  Filled 2018-03-18: qty 1

## 2018-03-18 MED ORDER — METOPROLOL TARTRATE 5 MG/5ML IV SOLN: 2.0000 mg | INTRAVENOUS | Status: DC | PRN

## 2018-03-18 MED ORDER — NITROGLYCERIN 0.4 MG SL SUBL: 0.4000 mg | SUBLINGUAL_TABLET | SUBLINGUAL | Status: DC | PRN

## 2018-03-18 MED ORDER — PROPOFOL 10 MG/ML IV BOLUS
INTRAVENOUS | Status: DC | PRN
  Administered 2018-03-18: 10 mg via INTRAVENOUS
  Administered 2018-03-18: 40 mg via INTRAVENOUS
  Administered 2018-03-18: 200 mg via INTRAVENOUS

## 2018-03-18 MED ORDER — HYDROCHLOROTHIAZIDE 12.5 MG PO CAPS: 12.5000 mg | ORAL_CAPSULE | Freq: Every day | ORAL | Status: DC

## 2018-03-18 MED ORDER — HYDROCHLOROTHIAZIDE 12.5 MG PO CAPS
12.5000 mg | ORAL_CAPSULE | Freq: Every day | ORAL | Status: DC
  Administered 2018-03-18 – 2018-03-24 (×7): 12.5 mg via ORAL
  Filled 2018-03-18 (×7): qty 1

## 2018-03-18 MED ORDER — LACTATED RINGERS IV SOLN
INTRAVENOUS | Status: DC | PRN
  Administered 2018-03-18: 07:00:00 via INTRAVENOUS

## 2018-03-18 MED ORDER — CHLORHEXIDINE GLUCONATE CLOTH 2 % EX PADS: 6.0000 | MEDICATED_PAD | Freq: Once | CUTANEOUS | Status: DC

## 2018-03-18 MED ORDER — SODIUM CHLORIDE 0.9 % IV SOLN: INTRAVENOUS | Status: DC

## 2018-03-18 MED ORDER — MAGNESIUM SULFATE 2 GM/50ML IV SOLN: 2.0000 g | Freq: Every day | INTRAVENOUS | Status: DC | PRN

## 2018-03-18 MED ORDER — FENTANYL CITRATE (PF) 100 MCG/2ML IJ SOLN
INTRAMUSCULAR | Status: DC | PRN
  Administered 2018-03-18: 25 ug via INTRAVENOUS
  Administered 2018-03-18 (×2): 50 ug via INTRAVENOUS
  Administered 2018-03-18: 25 ug via INTRAVENOUS
  Administered 2018-03-18: 50 ug via INTRAVENOUS

## 2018-03-18 MED ORDER — PANTOPRAZOLE SODIUM 40 MG PO TBEC
40.0000 mg | DELAYED_RELEASE_TABLET | Freq: Every day | ORAL | Status: DC
  Administered 2018-03-18 – 2018-03-24 (×7): 40 mg via ORAL
  Filled 2018-03-18 (×7): qty 1

## 2018-03-18 MED ORDER — ONDANSETRON HCL 4 MG/2ML IJ SOLN
4.0000 mg | Freq: Four times a day (QID) | INTRAMUSCULAR | Status: DC | PRN
  Filled 2018-03-18: qty 2

## 2018-03-18 MED ORDER — OXYCODONE HCL 5 MG PO TABS: 5.0000 mg | ORAL_TABLET | Freq: Once | ORAL | Status: DC | PRN

## 2018-03-18 MED ORDER — LORAZEPAM 1 MG PO TABS
2.0000 mg | ORAL_TABLET | Freq: Once | ORAL | Status: AC
  Administered 2018-03-18: 2 mg via ORAL
  Filled 2018-03-18: qty 2

## 2018-03-18 MED ORDER — LIDOCAINE HCL (CARDIAC) PF 100 MG/5ML IV SOSY
PREFILLED_SYRINGE | INTRAVENOUS | Status: DC | PRN
  Administered 2018-03-18: 80 mg via INTRAVENOUS

## 2018-03-18 MED ORDER — PROPOFOL 10 MG/ML IV BOLUS
INTRAVENOUS | Status: AC
  Filled 2018-03-18: qty 20

## 2018-03-18 MED ORDER — OXYCODONE HCL 5 MG PO TABS
5.0000 mg | ORAL_TABLET | ORAL | Status: DC | PRN
  Administered 2018-03-18 – 2018-03-19 (×5): 10 mg via ORAL
  Administered 2018-03-19: 5 mg via ORAL
  Administered 2018-03-20: 10 mg via ORAL
  Administered 2018-03-20: 5 mg via ORAL
  Administered 2018-03-20 – 2018-03-21 (×2): 10 mg via ORAL
  Filled 2018-03-18 (×7): qty 2
  Filled 2018-03-18: qty 1
  Filled 2018-03-18: qty 2
  Filled 2018-03-18: qty 1

## 2018-03-18 MED ORDER — DOCUSATE SODIUM 100 MG PO CAPS
100.0000 mg | ORAL_CAPSULE | Freq: Every day | ORAL | Status: DC
  Administered 2018-03-18 – 2018-03-24 (×7): 100 mg via ORAL
  Filled 2018-03-18 (×7): qty 1

## 2018-03-18 MED ORDER — PROMETHAZINE HCL 25 MG/ML IJ SOLN
INTRAMUSCULAR | Status: AC
  Filled 2018-03-18: qty 1

## 2018-03-18 MED ORDER — ACETAMINOPHEN 325 MG RE SUPP: 325.0000 mg | RECTAL | Status: DC | PRN

## 2018-03-18 MED ORDER — LABETALOL HCL 5 MG/ML IV SOLN: 10.0000 mg | INTRAVENOUS | Status: DC | PRN

## 2018-03-18 MED ORDER — HYDRALAZINE HCL 20 MG/ML IJ SOLN: 5.0000 mg | INTRAMUSCULAR | Status: DC | PRN

## 2018-03-18 MED ORDER — LISINOPRIL-HYDROCHLOROTHIAZIDE 10-12.5 MG PO TABS: 1.0000 | ORAL_TABLET | Freq: Every day | ORAL | Status: DC

## 2018-03-18 MED ORDER — LISINOPRIL 10 MG PO TABS: 10.0000 mg | ORAL_TABLET | Freq: Every day | ORAL | Status: DC

## 2018-03-18 MED ORDER — HYDROMORPHONE HCL 2 MG/ML IJ SOLN
0.2500 mg | INTRAMUSCULAR | Status: DC | PRN
  Administered 2018-03-18 (×3): 0.5 mg via INTRAVENOUS

## 2018-03-18 MED ORDER — GUAIFENESIN-DM 100-10 MG/5ML PO SYRP: 15.0000 mL | ORAL_SOLUTION | ORAL | Status: DC | PRN

## 2018-03-18 MED ORDER — OXYCODONE HCL 5 MG/5ML PO SOLN: 5.0000 mg | Freq: Once | ORAL | Status: DC | PRN

## 2018-03-18 MED ORDER — PROMETHAZINE HCL 25 MG/ML IJ SOLN
6.2500 mg | INTRAMUSCULAR | Status: DC | PRN
  Administered 2018-03-18: 12.5 mg via INTRAVENOUS

## 2018-03-18 MED ORDER — POTASSIUM CHLORIDE CRYS ER 20 MEQ PO TBCR: 20.0000 meq | EXTENDED_RELEASE_TABLET | Freq: Every day | ORAL | Status: DC | PRN

## 2018-03-18 MED ORDER — CEFAZOLIN SODIUM-DEXTROSE 2-4 GM/100ML-% IV SOLN
2.0000 g | Freq: Three times a day (TID) | INTRAVENOUS | Status: AC
  Administered 2018-03-18 (×2): 2 g via INTRAVENOUS
  Filled 2018-03-18 (×4): qty 100

## 2018-03-18 MED ORDER — SODIUM CHLORIDE 0.9 % IV SOLN
INTRAVENOUS | Status: DC
  Administered 2018-03-18: 11:00:00 via INTRAVENOUS

## 2018-03-18 MED ORDER — EPHEDRINE SULFATE 50 MG/ML IJ SOLN
INTRAMUSCULAR | Status: DC | PRN
  Administered 2018-03-18: 5 mg via INTRAVENOUS

## 2018-03-18 MED ORDER — PHENOL 1.4 % MT LIQD: 1.0000 | OROMUCOSAL | Status: DC | PRN

## 2018-03-18 MED ORDER — HEPARIN SODIUM (PORCINE) 5000 UNIT/ML IJ SOLN
5000.0000 [IU] | Freq: Three times a day (TID) | INTRAMUSCULAR | Status: DC
  Administered 2018-03-18 – 2018-03-24 (×18): 5000 [IU] via SUBCUTANEOUS
  Filled 2018-03-18 (×18): qty 1

## 2018-03-18 MED ORDER — HYDROMORPHONE HCL 2 MG/ML IJ SOLN
INTRAMUSCULAR | Status: AC
  Administered 2018-03-18: 0.5 mg via INTRAVENOUS
  Filled 2018-03-18: qty 1

## 2018-03-18 MED ORDER — ALUM & MAG HYDROXIDE-SIMETH 200-200-20 MG/5ML PO SUSP: 15.0000 mL | ORAL | Status: DC | PRN

## 2018-03-18 MED ORDER — POLYETHYLENE GLYCOL 3350 17 G PO PACK
17.0000 g | PACK | Freq: Every day | ORAL | Status: DC | PRN
  Administered 2018-03-21 – 2018-03-23 (×3): 17 g via ORAL
  Filled 2018-03-18 (×3): qty 1

## 2018-03-18 MED ORDER — 0.9 % SODIUM CHLORIDE (POUR BTL) OPTIME
TOPICAL | Status: DC | PRN
  Administered 2018-03-18: 1000 mL

## 2018-03-18 MED ORDER — HYDROXYCHLOROQUINE SULFATE 200 MG PO TABS
200.0000 mg | ORAL_TABLET | Freq: Two times a day (BID) | ORAL | Status: DC
  Administered 2018-03-18 – 2018-03-24 (×13): 200 mg via ORAL
  Filled 2018-03-18 (×13): qty 1

## 2018-03-18 MED ORDER — FENTANYL CITRATE (PF) 250 MCG/5ML IJ SOLN
INTRAMUSCULAR | Status: AC
  Filled 2018-03-18: qty 5

## 2018-03-18 MED ORDER — CEFAZOLIN SODIUM-DEXTROSE 2-4 GM/100ML-% IV SOLN
INTRAVENOUS | Status: AC
  Filled 2018-03-18: qty 100

## 2018-03-18 MED ORDER — DEXAMETHASONE SODIUM PHOSPHATE 10 MG/ML IJ SOLN
INTRAMUSCULAR | Status: DC | PRN
  Administered 2018-03-18: 10 mg via INTRAVENOUS

## 2018-03-18 MED ORDER — BISACODYL 10 MG RE SUPP: 10.0000 mg | Freq: Every day | RECTAL | Status: DC | PRN

## 2018-03-18 MED ORDER — LISINOPRIL 10 MG PO TABS
10.0000 mg | ORAL_TABLET | Freq: Every day | ORAL | Status: DC
  Administered 2018-03-18 – 2018-03-24 (×7): 10 mg via ORAL
  Filled 2018-03-18 (×8): qty 1

## 2018-03-18 MED ORDER — ADULT MULTIVITAMIN W/MINERALS CH
1.0000 | ORAL_TABLET | Freq: Every day | ORAL | Status: DC
  Administered 2018-03-18 – 2018-03-24 (×7): 1 via ORAL
  Filled 2018-03-18 (×7): qty 1

## 2018-03-18 SURGICAL SUPPLY — 50 items
BANDAGE ACE 4X5 VEL STRL LF (GAUZE/BANDAGES/DRESSINGS) ×1 IMPLANT
BANDAGE ACE 6X5 VEL STRL LF (GAUZE/BANDAGES/DRESSINGS) ×2 IMPLANT
BANDAGE ESMARK 6X9 LF (GAUZE/BANDAGES/DRESSINGS) IMPLANT
BLADE SAW RECIP 87.9 MT (BLADE) ×2 IMPLANT
BNDG CMPR 9X6 STRL LF SNTH (GAUZE/BANDAGES/DRESSINGS)
BNDG COHESIVE 6X5 TAN STRL LF (GAUZE/BANDAGES/DRESSINGS) ×2 IMPLANT
BNDG ESMARK 6X9 LF (GAUZE/BANDAGES/DRESSINGS)
BNDG GAUZE ELAST 4 BULKY (GAUZE/BANDAGES/DRESSINGS) ×3 IMPLANT
CANISTER SUCT 3000ML PPV (MISCELLANEOUS) ×2 IMPLANT
CLIP VESOCCLUDE MED 6/CT (CLIP) IMPLANT
COVER BACK TABLE 60X90IN (DRAPES) ×2 IMPLANT
COVER SURGICAL LIGHT HANDLE (MISCELLANEOUS) ×2 IMPLANT
CUFF TOURNIQUET SINGLE 18IN (TOURNIQUET CUFF) IMPLANT
CUFF TOURNIQUET SINGLE 24IN (TOURNIQUET CUFF) IMPLANT
CUFF TOURNIQUET SINGLE 34IN LL (TOURNIQUET CUFF) IMPLANT
CUFF TOURNIQUET SINGLE 44IN (TOURNIQUET CUFF) IMPLANT
DRAIN CHANNEL 19F RND (DRAIN) IMPLANT
DRAPE HALF SHEET 40X57 (DRAPES) ×4 IMPLANT
DRAPE ORTHO SPLIT 77X108 STRL (DRAPES) ×4
DRAPE SURG ORHT 6 SPLT 77X108 (DRAPES) ×2 IMPLANT
DRSG ADAPTIC 3X8 NADH LF (GAUZE/BANDAGES/DRESSINGS) ×2 IMPLANT
ELECT REM PT RETURN 9FT ADLT (ELECTROSURGICAL) ×2
ELECTRODE REM PT RTRN 9FT ADLT (ELECTROSURGICAL) ×1 IMPLANT
EVACUATOR SILICONE 100CC (DRAIN) IMPLANT
GAUZE SPONGE 4X4 12PLY STRL (GAUZE/BANDAGES/DRESSINGS) ×3 IMPLANT
GLOVE BIO SURGEON STRL SZ 6.5 (GLOVE) ×5 IMPLANT
GLOVE BIO SURGEON STRL SZ7.5 (GLOVE) ×2 IMPLANT
GLOVE BIOGEL PI IND STRL 6.5 (GLOVE) IMPLANT
GLOVE BIOGEL PI INDICATOR 6.5 (GLOVE) ×1
GLOVE ECLIPSE 6.5 STRL STRAW (GLOVE) ×6 IMPLANT
GOWN STRL REUS W/ TWL LRG LVL3 (GOWN DISPOSABLE) ×3 IMPLANT
GOWN STRL REUS W/TWL LRG LVL3 (GOWN DISPOSABLE) ×16
KIT BASIN OR (CUSTOM PROCEDURE TRAY) ×2 IMPLANT
KIT TURNOVER KIT B (KITS) ×2 IMPLANT
NS IRRIG 1000ML POUR BTL (IV SOLUTION) ×2 IMPLANT
PACK GENERAL/GYN (CUSTOM PROCEDURE TRAY) ×2 IMPLANT
PAD ARMBOARD 7.5X6 YLW CONV (MISCELLANEOUS) ×4 IMPLANT
SPONGE LAP 18X18 X RAY DECT (DISPOSABLE) ×1 IMPLANT
STAPLER VISISTAT 35W (STAPLE) ×2 IMPLANT
STOCKINETTE IMPERVIOUS LG (DRAPES) ×2 IMPLANT
SUT ETHILON 3 0 PS 1 (SUTURE) IMPLANT
SUT SILK 2 0 (SUTURE) ×4
SUT SILK 2 0 SH CR/8 (SUTURE) ×3 IMPLANT
SUT SILK 2-0 18XBRD TIE 12 (SUTURE) ×1 IMPLANT
SUT VIC AB 2-0 SH 18 (SUTURE) ×6 IMPLANT
SUT VIC AB 3-0 SH 27 (SUTURE) ×2
SUT VIC AB 3-0 SH 27X BRD (SUTURE) ×1 IMPLANT
TOWEL GREEN STERILE (TOWEL DISPOSABLE) ×4 IMPLANT
UNDERPAD 30X30 (UNDERPADS AND DIAPERS) ×2 IMPLANT
WATER STERILE IRR 1000ML POUR (IV SOLUTION) ×2 IMPLANT

## 2018-03-18 NOTE — Anesthesia Preprocedure Evaluation (Addendum)
Anesthesia Evaluation  Patient identified by MRN, date of birth, ID band Patient awake    Reviewed: Allergy & Precautions, NPO status , Patient's Chart, lab work & pertinent test results  Airway Mallampati: II  TM Distance: >3 FB Neck ROM: Full    Dental no notable dental hx. (+) Edentulous Upper, Edentulous Lower, Dental Advidsory Given   Pulmonary former smoker,    Pulmonary exam normal breath sounds clear to auscultation       Cardiovascular hypertension, Pt. on medications + Peripheral Vascular Disease  Normal cardiovascular exam Rhythm:Regular Rate:Normal     Neuro/Psych  Headaches,    GI/Hepatic GERD  Medicated and Controlled,  Endo/Other    Renal/GU      Musculoskeletal  (+) Arthritis ,   Abdominal   Peds  Hematology  (+) anemia ,   Anesthesia Other Findings   Reproductive/Obstetrics                            Anesthesia Physical  Anesthesia Plan  ASA: III  Anesthesia Plan: General   Post-op Pain Management:    Induction: Intravenous  PONV Risk Score and Plan: 2 and Ondansetron and Treatment may vary due to age or medical condition  Airway Management Planned: LMA  Additional Equipment:   Intra-op Plan:   Post-operative Plan: Extubation in OR  Informed Consent: I have reviewed the patients History and Physical, chart, labs and discussed the procedure including the risks, benefits and alternatives for the proposed anesthesia with the patient or authorized representative who has indicated his/her understanding and acceptance.   Dental advisory given and Dental Advisory Given  Plan Discussed with: CRNA and Anesthesiologist  Anesthesia Plan Comments:        Anesthesia Quick Evaluation

## 2018-03-18 NOTE — Interval H&P Note (Signed)
History and Physical Interval Note:  03/18/2018 7:23 AM  Jose Bush  has presented today for surgery, with the diagnosis of nonviable ischemic right leg  The various methods of treatment have been discussed with the patient and family. After consideration of risks, benefits and other options for treatment, the patient has consented to  Procedure(s): AMPUTATION BELOW KNEE (Right) as a surgical intervention .  The patient's history has been reviewed, patient examined, no change in status, stable for surgery.  I have reviewed the patient's chart and labs.  Questions were answered to the patient's satisfaction.     Fabienne Brunsharles Shaka Zech

## 2018-03-18 NOTE — Progress Notes (Signed)
PT Cancellation Note  Patient Details Name: Jose Bush MRN: [id number] DOB: 11/21/1940   Cancelled Treatment:    Reason Eval/Treat Not Completed: Other (comment). Pt with surgery this AM.   [name] 03/18/2018, 1:55 PM [facility] Ileane Sando PT [phone]

## 2018-03-18 NOTE — Transfer of Care (Signed)
Immediate Anesthesia Transfer of Care Note  Patient: Jose Bush  Procedure(s) Performed: AMPUTATION BELOW KNEE (Right Leg Lower)  Patient Location: PACU  Anesthesia Type:General  Level of Consciousness: awake, alert  and oriented  Airway & Oxygen Therapy: Patient Spontanous Breathing and Patient connected to nasal cannula oxygen  Post-op Assessment: Report given to RN, Post -op Vital signs reviewed and stable and Patient moving all extremities X 4  Post vital signs: Reviewed and stable  Last Vitals:  Vitals Value Taken Time  BP    Temp    Pulse 79 03/18/2018  9:39 AM  Resp    SpO2 97 % 03/18/2018  9:39 AM  Vitals shown include unvalidated device data.  Last Pain:  Vitals:   03/18/18 0606  TempSrc: Oral  PainSc:       Patients Stated Pain Goal: 3 (03/18/18 0602)  Complications: No apparent anesthesia complications

## 2018-03-18 NOTE — Progress Notes (Signed)
Pt received from PACU. R BKA ace wrap clean and intact, elevated on pillow. Pt very groggy. A/Ox4. Telemetry applied. CHG complete. VSS. Pt oriented to room and unit. Will continue to monitor.  Versie StarksHanna  Lameshia Hypolite, RN

## 2018-03-18 NOTE — Consult Note (Signed)
Physical Medicine and Rehabilitation Consult   Reason for Consult: R-BKA due to RLE ischemia.  Referring Physician: Dr. Darrick Penna.    HPI: Jose Bush is a 77 y.o. male with history of lupus, GERD, iron deficiency anemia, PAD with right 2nd toe amputation 6/3 with poor wound healing, pain and progressive ischemia.  He was admitted on 03/18/18 for R-BKA by Dr. Darrick Penna. Post op has had urinary retention requiring foley placement. Therapy evaluations to be done today and CIR consulted in anticipation of extensive rehab needs.   Patient still taking IV pain medications.  He has been up in a chair and is starting to feel fatigued. Review of Systems  Constitutional: Negative for chills and fever.  HENT: Positive for hearing loss. Negative for ear pain and tinnitus.   Eyes: Negative for blurred vision and double vision.  Respiratory: Negative for cough and shortness of breath.   Cardiovascular: Negative for chest pain, palpitations and leg swelling.  Gastrointestinal: Positive for constipation (on and off due to narcotics) and heartburn. Negative for nausea and vomiting.  Genitourinary:       History of hesitancy and nocturia.   Musculoskeletal: Negative for back pain, joint pain and myalgias.  Skin: Negative for itching and rash.  Neurological: Positive for sensory change (R-BKA site). Negative for dizziness and headaches.  Psychiatric/Behavioral: The patient is not nervous/anxious and does not have insomnia.      Past Medical History:  Diagnosis Date  . Arthritis   . Chest pain, unspecified   . DVT (deep venous thrombosis) (HCC)   . GERD (gastroesophageal reflux disease)   . Heart murmur    hx of   . History of hiatal hernia   . HTN (hypertension)   . Lupus (HCC)   . Lupus (HCC)   . Peripheral vascular disease (HCC)    nonviable tissue   . Shingles    hx of   . Stroke [facility]) 2010   'MILD" per pt-  weakness left side  . Tinnitus   . Ulcer of abdomen wall (HCC)   .  Wears dentures   . Wears glasses     Past Surgical History:  Procedure Laterality Date  . ABDOMINAL AORTOGRAM W/LOWER EXTREMITY N/A 02/17/2018   Procedure: ABDOMINAL AORTOGRAM W/LOWER EXTREMITY;  Surgeon: Maeola Harman, MD;  Location: [facility] INVASIVE CV LAB;  Service: Cardiovascular;  Laterality: N/A;  . AMPUTATION Right 03/03/2018   Procedure: AMPUTATION RIGHT SECOND TOE;  Surgeon: Sherren Kerns, MD;  Location[facility] OR;  Service: Vascular;  Laterality: Right;  . bilateral cataract surgery     . BIOPSY N/A 10/22/2017   Procedure: BIOPSY;  Surgeon: Franky Macho, MD;  Location: [facility] ENDO SUITE;  Service: Gastroenterology;  Laterality: N/A;  . COLONOSCOPY N/A 09/18/2016   Dr. Lovell Sheehan: normal  . ESOPHAGOGASTRODUODENOSCOPY N/A 10/22/2017   Dr. Lovell Sheehan: normal duodenum, gastric mucosal atrophy but negative H.pylori (Clotest negative), normal GE junction  . GIVENS CAPSULE STUDY N/A 01/27/2018   Procedure: GIVENS CAPSULE STUDY;  Surgeon: Corbin Ade, MD;  Location: [facility] ENDO SUITE;  Service: Endoscopy;  Laterality: N/A;  7:00am  . HERNIA REPAIR     2010/laparoscopic/right  . ivc filter     01/2013   . KNEE ARTHROSCOPY Right    2007  . MULTIPLE TOOTH EXTRACTIONS    . PERIPHERAL VASCULAR BALLOON ANGIOPLASTY  02/17/2018   Procedure: PERIPHERAL VASCULAR BALLOON ANGIOPLASTY;  Surgeon: Maeola Harman, MD;  Location: [facility] INVASIVE CV LAB;  Service:  Cardiovascular;;  . TOTAL HIP ARTHROPLASTY Right 05/01/2017   Procedure: RIGHT TOTAL HIP ARTHROPLASTY ANTERIOR APPROACH;  Surgeon: Ollen Gross, MD;  Location: [facility] ORS;  Service: Orthopedics;  Laterality: Right;  . TOTAL KNEE ARTHROPLASTY Left 12/13/2014   Procedure: LEFT TOTAL KNEE ARTHROPLASTY;  Surgeon: Ollen Gross, MD;  Location: [facility] ORS;  Service: Orthopedics;  Laterality: Left;  . TOTAL KNEE ARTHROPLASTY Right 03/21/2015   Procedure: RIGHT TOTAL KNEE ARTHROPLASTY;  Surgeon: Ollen Gross, MD;  Location: [facility] ORS;  Service: Orthopedics;   Laterality: Right;    Family History  Problem Relation Age of Onset  . Hypertension Other   . Stroke Mother   . Colon cancer Neg Hx     Social History:  Married--wife retired and supportive. He retired from [employer] with walker PTA. He   reports that he quit smoking about 34 years ago. His smoking use included cigarettes. He has a 20.00 pack-year smoking history. He has never used smokeless tobacco. He reports that he does not drink alcohol or use drugs.    Allergies: No Known Allergies    Medications Prior to Admission  Medication Sig Dispense Refill  . acetaminophen (TYLENOL) 500 MG tablet Take 500 mg by mouth every 6 (six) hours as needed for moderate pain or headache.     Marland Kitchen Bioflavonoid Products (ESTER C PO) Take 500 mg by mouth daily.    Marland Kitchen docusate sodium (COLACE) 100 MG capsule Take 100 mg by mouth daily.     Marland Kitchen doxycycline (VIBRA-TABS) 100 MG tablet Take 1 tablet (100 mg total) by mouth 2 (two) times daily for 14 days. 28 tablet 0  . hydroxychloroquine (PLAQUENIL) 200 MG tablet Take 200 mg by mouth 2 (two) times daily.    Marland Kitchen lisinopril-hydrochlorothiazide (PRINZIDE,ZESTORETIC) 10-12.5 MG per tablet Take 1 tablet by mouth daily with breakfast.     . Misc Natural Products (URINOZINC PO) Take 1 capsule by mouth 2 (two) times daily.     . Multiple Vitamin (MULTIVITAMIN WITH MINERALS) TABS tablet Take 1 tablet by mouth daily. Centrum Silver    . nitroGLYCERIN (NITROSTAT) 0.4 MG SL tablet Place 0.4 mg under the tongue every 5 (five) minutes as needed for chest pain. x3 doses as needed for chest pain    . OMEGA-3 KRILL OIL PO Take 1 capsule by mouth daily.    Marland Kitchen omeprazole (PRILOSEC) 20 MG capsule Take 20 mg by mouth daily.    . traMADol (ULTRAM) 50 MG tablet Take 1 tablet (50 mg total) by mouth every 6 (six) hours as needed. (Patient taking differently: Take 50 mg by mouth every 6 (six) hours as needed for severe pain. ) 20 tablet 0  . warfarin (COUMADIN) 5 MG tablet  Take 1 tablet (5 mg total) by mouth every morning. Take Coumadin for three weeks for postoperative protocol and then the patient may resume their previous Coumadin home regimen.  The dose may need to be adjusted based upon the INR.  Please follow the INR and titrate Coumadin dose for a therapeutic range between 2.0 and 3.0 INR.  After completing the three weeks of Coumadin, the patient may resume their previous Coumadin home regimen. (Patient taking differently: Take 5 mg by mouth daily. ) 35 tablet 0  . Menthol, Topical Analgesic, (ICY HOT EX) Apply 1 application topically 2 (two) times daily as needed (left knee.).     Marland Kitchen oxyCODONE (OXY IR/ROXICODONE) 5 MG immediate release tablet Take 1 tablet (5 mg total) by mouth every 6 (six) hours  as needed. (Patient not taking: Reported on 03/14/2018) 6 tablet 0    Home: Home Living Family/patient expects to be discharged to:: Private residence Living Arrangements: Spouse/significant other  Functional History:   Functional Status:  Mobility:          ADL:    Cognition: Cognition Orientation Level: Oriented X4    Blood pressure (!) 146/71, pulse 71, temperature 98.9 F (37.2 C), temperature source Oral, resp. rate 15, height 5\' 8"  (1.727 m), weight 93.9 kg (207 lb), SpO2 95 %. Physical Exam  Nursing note and vitals reviewed. Constitutional: He is oriented to person, place, and time. He appears well-developed and well-nourished.  HENT:  Head: Normocephalic and atraumatic.  Eyes: Pupils are equal, round, and reactive to light.  Cardiovascular: Normal rate, regular rhythm and normal heart sounds.  No murmur heard. Respiratory: Effort normal and breath sounds normal. No respiratory distress. He has no wheezes.  GI: Soft. Bowel sounds are normal. He exhibits no distension. There is no tenderness.  Musculoskeletal:  R-BKA with edema and compressive dressing.   Neurological: He is alert and oriented to person, place, and time.  Skin: Skin is  warm and dry.  Psychiatric: He has a normal mood and affect.  Motor strength is 5/5 bilateral deltoid bicep tricep grip 4/5 in the left hip flexor knee extensor ankle dorsiflexor 4/5 in the right hip flexor Sensation intact to light touch in the left lower limb.  Results for orders placed or performed during the hospital encounter of 03/18/18 (from the past 24 hour(s))  Basic metabolic panel     Status: None   Collection Time: 03/18/18  6:31 AM  Result Value Ref Range   Sodium 139 135 - 145 mmol/L   Potassium 3.8 3.5 - 5.1 mmol/L   Chloride 106 101 - 111 mmol/L   CO2 26 22 - 32 mmol/L   Glucose, Bld 85 65 - 99 mg/dL   BUN 11 6 - 20 mg/dL   Creatinine, Ser 1.610.70 0.61 - 1.24 mg/dL   Calcium 9.0 8.9 - 09.610.3 mg/dL   GFR calc non Af Amer >60 >60 mL/min   GFR calc Af Amer >60 >60 mL/min   Anion gap 7 5 - 15  APTT     Status: Abnormal   Collection Time: 03/18/18  6:31 AM  Result Value Ref Range   aPTT 46 (H) 24 - 36 seconds  CBC     Status: Abnormal   Collection Time: 03/18/18  6:31 AM  Result Value Ref Range   WBC 7.0 4.0 - 10.5 K/uL   RBC 3.80 (L) 4.22 - 5.81 MIL/uL   Hemoglobin 10.8 (L) 13.0 - 17.0 g/dL   HCT 04.534.6 (L) 40.939.0 - 81.152.0 %   MCV 91.1 78.0 - 100.0 fL   MCH 28.4 26.0 - 34.0 pg   MCHC 31.2 30.0 - 36.0 g/dL   RDW 91.413.8 78.211.5 - 95.615.5 %   Platelets 229 150 - 400 K/uL  Protime-INR     Status: Abnormal   Collection Time: 03/18/18  6:31 AM  Result Value Ref Range   Prothrombin Time 18.3 (H) 11.4 - 15.2 seconds   INR 1.54    Dg Tibia/fibula Right  Result Date: 03/18/2018 CLINICAL DATA:  Gangrene, atherosclerosis. EXAM: RIGHT TIBIA AND FIBULA - 2 VIEW COMPARISON:  None. FINDINGS: There is no evidence of fracture or other focal bone lesions. Status post total knee arthroplasty, intact and well-seated hardware without periprosthetic lucency. Moderate vascular calcifications. No subcutaneous gas or radiopaque  foreign bodies. IMPRESSION: No radiographic findings of osteomyelitis.  Electronically Signed   By: Awilda Metro M.D.   On: 03/18/2018 06:11    Assessment/Plan: Diagnosis: Right below-knee amputation secondary to peripheral artery disease. 1. Does the need for close, 24 hr/day medical supervision in concert with the patient's rehab needs make it unreasonable for this patient to be served in a less intensive setting? Yes 2. Co-Morbidities requiring supervision/potential complications: Lupus, hypertension 3. Due to bladder management, bowel management, safety, skin/wound care, disease management, medication administration, pain management and patient education, does the patient require 24 hr/day rehab nursing? Yes 4. Does the patient require coordinated care of a physician, rehab nurse, PT (1-2 hrs/day, 5 days/week) and OT (1-2 hrs/day, 5 days/week) to address physical and functional deficits in the context of the above medical diagnosis(es)? Yes Addressing deficits in the following areas: balance, endurance, locomotion, strength, transferring, bowel/bladder control, bathing, dressing and toileting 5. Can the patient actively participate in an intensive therapy program of at least 3 hrs of therapy per day at least 5 days per week? Yes 6. The potential for patient to make measurable gains while on inpatient rehab is excellent 7. Anticipated functional outcomes upon discharge from inpatient rehab are modified independent  with PT, modified independent with OT, n/a with SLP. 8. Estimated rehab length of stay to reach the above functional goals is: 7 to 10 days 9. Anticipated D/C setting: Home 10. Anticipated post D/C treatments: HH therapy 11. Overall Rehab/Functional Prognosis: excellent  RECOMMENDATIONS: This patient's condition is appropriate for continued rehabilitative care in the following setting: CIR once off IV pain medications and tolerating PT OT Patient has agreed to participate in recommended program. Patient would like to go to a place closer to  home. Note that insurance prior authorization may be required for reimbursement for recommended care.  Comment: We discussed differences in terms of therapy intensity and MD availability in CIR versus SNF setting  "I have personally performed a face to face diagnostic evaluation of this patient.  Additionally, I have reviewed and concur with the physician assistant's documentation above." Erick Colace M.D. [facility] FAAPM&R (Sports Med, Neuromuscular Med) Diplomate Am Board of Electrodiagnostic Med  Jacquelynn Cree, PA-C 03/18/2018

## 2018-03-18 NOTE — Anesthesia Postprocedure Evaluation (Signed)
Anesthesia Post Note  Patient: Clearnce HastenWilliam L Iorio  Procedure(s) Performed: AMPUTATION BELOW KNEE (Right Leg Lower)     Patient location during evaluation: PACU Anesthesia Type: General Level of consciousness: awake and alert Pain management: pain level controlled Vital Signs Assessment: post-procedure vital signs reviewed and stable Respiratory status: spontaneous breathing, nonlabored ventilation and respiratory function stable Cardiovascular status: blood pressure returned to baseline and stable Postop Assessment: no apparent nausea or vomiting Anesthetic complications: no    Last Vitals:  Vitals:   03/18/18 1025 03/18/18 1106  BP: 131/66 (!) 146/71  Pulse: 72 71  Resp: 12 15  Temp:  37.2 C  SpO2: 99% 95%    Last Pain:  Vitals:   03/18/18 1106  TempSrc: Oral  PainSc:                  Lowella CurbWarren Ray Marybell Robards

## 2018-03-18 NOTE — Op Note (Signed)
Procedure: Right below-knee amputation  Preoperative diagnosis: non healing wound right foot  Postoperative diagnosis: Same  Anesthesia Gen.  Assistant: Doreatha MassedSamantha Rhyne PA-C  Upper findings: #1 calcified vessels, well perfused muscle tissue  Operative details: After obtaining informed consent, the patient was taken to the operating room. The patient was placed in supine position on the operating table. After induction of general anesthesia and endotracheal intubation, the patient's entire right lower extremity was prepped and draped all the way down to the level of the ankle. Next a transverse incision was made approximately 4 fingerbreadths below the tibial tuberosity on the right leg.  The  incision was carried posteriorly to the midportion of the leg and then extended longitudinally to create a posterior flap. The subcutaneous tissues and fascia was taken down with cautery. Periosteum was raised on the tibia approximately 5 cm above the skin edge.  The periosteum was also raised on the fibula several centimeters above this. The tibia was then divided with a saw. The fibula was divided with a bone cutter. The leg was then elevated in the operative field and the amputation was completed posterior to the bones with an amputation knife. Hemostasis was then obtained with cautery and several suture ligatures. The tibial and sural nerves were pulled down into the field transected and allowed to retract up into the leg.  After hemostasis was obtained, the wound was thoroughly irrigated with  normal saline solution. The fascial edges were then reapproximated with interrupted 2-0 Vicryl sutures. Subcutaneous tissues were reapproximated using running 3-0 Vicryl suture. The skin was closed with staples. The patient tolerated the procedure well and there were no complications. Instrument sponge and  needle counts were correct at the end of the case. The patient was taken to the recovery room in stable  condition.  Fabienne Brunsharles Deakon Frix, MD [facility] [place] Office: [phone] Pager: [phone]

## 2018-03-18 NOTE — Anesthesia Procedure Notes (Signed)
Procedure Name: LMA Insertion Date/Time: 03/18/2018 7:38 AM Performed by: Carmela RimaMartinelli, Josia Cueva F, CRNA Pre-anesthesia Checklist: Patient identified, Emergency Drugs available, Suction available and Patient being monitored Patient Re-evaluated:Patient Re-evaluated prior to induction Oxygen Delivery Method: Circle system utilized Preoxygenation: Pre-oxygenation with 100% oxygen Induction Type: IV induction LMA: LMA inserted LMA Size: 5.0 Number of attempts: 1 Dental Injury: Teeth and Oropharynx as per pre-operative assessment

## 2018-03-19 ENCOUNTER — Encounter (HOSPITAL_COMMUNITY): Payer: Self-pay | Admitting: Vascular Surgery

## 2018-03-19 LAB — URINALYSIS, ROUTINE W REFLEX MICROSCOPIC
BACTERIA UA: NONE SEEN
Bilirubin Urine: NEGATIVE
GLUCOSE, UA: NEGATIVE mg/dL
KETONES UR: NEGATIVE mg/dL
Nitrite: NEGATIVE
PROTEIN: NEGATIVE mg/dL
Specific Gravity, Urine: 1.016 (ref 1.005–1.030)
pH: 7 (ref 5.0–8.0)

## 2018-03-19 LAB — BASIC METABOLIC PANEL
Anion gap: 5 (ref 5–15)
BUN: 11 mg/dL (ref 6–20)
CALCIUM: 8.6 mg/dL — AB (ref 8.9–10.3)
CO2: 28 mmol/L (ref 22–32)
CREATININE: 0.74 mg/dL (ref 0.61–1.24)
Chloride: 105 mmol/L (ref 101–111)
GFR calc Af Amer: >60 mL/min (ref >60)
GLUCOSE: 131 mg/dL — AB (ref 65–99)
Potassium: 4 mmol/L (ref 3.5–5.1)
Sodium: 138 mmol/L (ref 135–145)

## 2018-03-19 LAB — PROTIME-INR
INR: 1.54
PROTHROMBIN TIME: 18.4 s — AB (ref 11.4–15.2)

## 2018-03-19 LAB — CBC
HCT: 28.1 % — ABNORMAL LOW (ref 39.0–52.0)
Hemoglobin: 8.8 g/dL — ABNORMAL LOW (ref 13.0–17.0)
MCH: 28.7 pg (ref 26.0–34.0)
MCHC: 31.3 g/dL (ref 30.0–36.0)
MCV: 91.5 fL (ref 78.0–100.0)
Platelets: 258 10*3/uL (ref 150–400)
RBC: 3.07 MIL/uL — ABNORMAL LOW (ref 4.22–5.81)
RDW: 13.8 % (ref 11.5–15.5)
WBC: 9.9 10*3/uL (ref 4.0–10.5)

## 2018-03-19 MED ORDER — IBUPROFEN 400 MG PO TABS
400.0000 mg | ORAL_TABLET | Freq: Once | ORAL | Status: AC
Start: 2018-03-19 — End: 2018-03-19
  Administered 2018-03-19: 400 mg via ORAL
  Filled 2018-03-19: qty 1

## 2018-03-19 MED ORDER — TAMSULOSIN HCL 0.4 MG PO CAPS
0.4000 mg | ORAL_CAPSULE | Freq: Every day | ORAL | Status: DC
  Administered 2018-03-19 – 2018-03-23 (×5): 0.4 mg via ORAL
  Filled 2018-03-19 (×5): qty 1

## 2018-03-19 MED ORDER — METHOCARBAMOL 500 MG PO TABS
500.0000 mg | ORAL_TABLET | Freq: Four times a day (QID) | ORAL | Status: DC | PRN
  Administered 2018-03-19 – 2018-03-24 (×7): 500 mg via ORAL
  Filled 2018-03-19 (×7): qty 1

## 2018-03-19 NOTE — Progress Notes (Signed)
Pt had small amount of sanguinous drainage on the outside of his dressing. RN reinforced dressing without removing the dressing. Will continue to monitor.

## 2018-03-19 NOTE — Evaluation (Signed)
Physical Therapy Evaluation Patient Details Name: Jose Bush MRN: [id number] DOB: 07-Jun-1941 Today's Date: 03/19/2018   History of Present Illness  Pt s/p rt BKA. PMH - HTN, PAD, rt 2nd toe amputation, lupus, GERD, DVT  Clinical Impression  Pt presents to PT with decreased mobility after rt BKA. Pt very motivated to work toward independence. Pt/wife would like pt to go to rehab at [facility] [place] and report they have a rehab unit. Agree with inpatient rehab (unsure of specifics of [facility]).    Follow Up Recommendations CIR(Pt wants to do this at [facility])    Equipment Recommendations  Wheelchair (measurements PT);Wheelchair cushion (measurements PT)    Recommendations for Other Services       Precautions / Restrictions Precautions Precautions: Fall      Mobility  Bed Mobility Overal bed mobility: Needs Assistance Bed Mobility: Supine to Sit     Supine to sit: Mod assist     General bed mobility comments: assist to bring legs off and elevate trunk into sitting  Transfers Overall transfer level: Needs assistance Equipment used: Ambulation equipment used Transfers: Sit to/from [employer] Transfers Sit to Stand: Mod assist Stand pivot transfers: (with Stedy)       General transfer comment: Assist to bring hips/trunk up. Used Stedy to pivot from bed to chair.  Ambulation/Gait                Stairs            Wheelchair Mobility    Modified Rankin (Stroke Patients Only)       Balance Overall balance assessment: Needs assistance Sitting-balance support: Bilateral upper extremity supported Sitting balance-Leahy Scale: Poor Sitting balance - Comments: min guard for static sitting Postural control: Right lateral lean Standing balance support: Bilateral upper extremity supported;During functional activity Standing balance-Leahy Scale: Poor Standing balance comment: Stedy and min assist for static standing                             Pertinent Vitals/Pain Pain Assessment: Faces Faces Pain Scale: Hurts little more Pain Location: RLE Pain Descriptors / Indicators: Aching;Operative site guarding Pain Intervention(s): Limited activity within patient's tolerance;Monitored during session    Home Living Family/patient expects to be discharged to:: Private residence Living Arrangements: Spouse/significant other Available Help at Discharge: Family;Available 24 hours/day Type of Home: House Home Access: Stairs to enter Entrance Stairs-Rails: Doctor, general practice of Steps: 2 Home Layout: One level Home Equipment: Walker - 2 wheels;Cane - single point;Shower seat;Bedside commode      Prior Function Level of Independence: Independent with assistive device(s)         Comments: Using rolling walker     Hand Dominance   Dominant Hand: Right    Extremity/Trunk Assessment   Upper Extremity Assessment Upper Extremity Assessment: Defer to OT evaluation    Lower Extremity Assessment Lower Extremity Assessment: RLE deficits/detail;Generalized weakness RLE Deficits / Details: BKA       Communication   Communication: No difficulties  Cognition Arousal/Alertness: Awake/alert Behavior During Therapy: WFL for tasks assessed/performed Overall Cognitive Status: Within Functional Limits for tasks assessed                                        General Comments General comments (skin integrity, edema, etc.): [name] present    Exercises Amputee  Exercises Quad Sets: Right;5 reps Knee Flexion: 5 reps;AROM;Right;Supine Straight Leg Raises: AROM;Right;5 reps;Supine   Assessment/Plan    PT Assessment Patient needs continued PT services  PT Problem List Decreased strength;Decreased activity tolerance;Decreased balance;Decreased mobility;Decreased knowledge of use of DME       PT Treatment Interventions DME instruction;Gait training;Functional  mobility training;Therapeutic activities;Therapeutic exercise;Balance training;Patient/family education    PT Goals (Current goals can be found in the Care Plan section)  Acute Rehab PT Goals Patient Stated Goal: go to rehab and then home PT Goal Formulation: With patient Time For Goal Achievement: 04/02/18 Potential to Achieve Goals: Good    Frequency Min 3X/week   Barriers to discharge Inaccessible home environment stairs to enter    Co-evaluation               AM-PAC PT "6 Clicks" Daily Activity  Outcome Measure Difficulty turning over in bed (including adjusting bedclothes, sheets and blankets)?: Unable Difficulty moving from lying on back to sitting on the side of the bed? : Unable Difficulty sitting down on and standing up from a chair with arms (e.g., wheelchair, bedside commode, etc,.)?: Unable Help needed moving to and from a bed to chair (including a wheelchair)?: Total Help needed walking in hospital room?: Total Help needed climbing 3-5 steps with a railing? : Total 6 Click Score: 6    End of Session Equipment Utilized During Treatment: Gait belt Activity Tolerance: Patient tolerated treatment well Patient left: in chair;with call bell/phone within reach;with family/visitor present Nurse Communication: Mobility status;Need for lift equipment PT Visit Diagnosis: Other abnormalities of gait and mobility (R26.89);Difficulty in walking, not elsewhere classified (R26.2)    Time: [id number] PT Time Calculation (min) (ACUTE ONLY): 28 min   Charges:   PT Evaluation $PT Eval Moderate Complexity: 1 Mod PT Treatments $Therapeutic Exercise: 8-22 mins   PT G CodesMarland Kitchen:        [facility] Campus[facility] Sharalyn Lomba PT [phone]   Angelina OkCary W [facility] Hospital[facility]cok 03/19/2018, 12:33 PM

## 2018-03-19 NOTE — Progress Notes (Addendum)
  Progress Note    03/19/2018 8:34 AM 1 Day Post-Op  Subjective:  Had urinary retention last night; has some pain in his knee  Tm 100.2 this am HR 70's-100's NSR 130's-160's systolic 99% RA  Vitals:   03/19/18 0445 03/19/18 0801  BP: (!) 162/61 136/66  Pulse: 89 93  Resp: (!) 22 (!) 22  Temp: 98.5 F (36.9 C) 100.2 F (37.9 C)  SpO2: 98%     Physical Exam: Incisions:  Bandage is clean and dry   CBC    Component Value Date/Time   WBC 9.9 03/19/2018 0320   RBC 3.07 (L) 03/19/2018 0320   HGB 8.8 (L) 03/19/2018 0320   HCT 28.1 (L) 03/19/2018 0320   PLT 258 03/19/2018 0320   MCV 91.5 03/19/2018 0320   MCH 28.7 03/19/2018 0320   MCHC 31.3 03/19/2018 0320   RDW 13.8 03/19/2018 0320   LYMPHSABS 0.9 02/05/2013 0640   MONOABS 0.4 02/05/2013 0640   EOSABS 0.0 02/05/2013 0640   BASOSABS 0.0 02/05/2013 0640    BMET    Component Value Date/Time   NA 138 03/19/2018 0320   K 4.0 03/19/2018 0320   CL 105 03/19/2018 0320   CO2 28 03/19/2018 0320   GLUCOSE 131 (H) 03/19/2018 0320   BUN 11 03/19/2018 0320   CREATININE 0.74 03/19/2018 0320   CALCIUM 8.6 (L) 03/19/2018 0320   GFRNONAA >60 03/19/2018 0320   GFRAA >60 03/19/2018 0320    INR    Component Value Date/Time   INR 1.54 03/19/2018 0320     Intake/Output Summary (Last 24 hours) at 03/19/2018 0834 Last data filed at 03/18/2018 2142 Gross per 24 hour  Intake 1445.6 ml  Output 350 ml  Net 1095.6 ml     Assessment/Plan:  77 y.o. male is s/p right below knee amputation  1 Day Post-Op  -pt's bandage is clean and dry-will take this off tomorrow -pt with urinary retention last evening and foley placed.  He says he has tried Flomax in the past and uses something over the counter that is effective.  Discussed with him and will try Flomax and leave catheter today.   -probably restart coumadin today but will d/w Dr. Darrick PennaFields -DVT prophylaxis: sq heparin started yesterday afternoon -PT/OT and oob   Doreatha MassedSamantha  Rhyne, PA-C [facility] [phone] 03/19/2018 8:34 AM  Restart oral anticoagulation Will change dressing tomorrow [facility] Will pursue this with social work/care mgmt Did well with PT today Acute blood loss anemia asymptomatic  Fabienne Brunsharles Marcelo Ickes, MD [facility] [place] Office: [phone] Pager: [phone]

## 2018-03-19 NOTE — Progress Notes (Signed)
Pt complaining of on going urge to urinate with only getting out small amounts. RN bladder scanned . RN called on call MD. UA obtained and Foley placed. Residual scanned was . Will continue to follow.

## 2018-03-19 NOTE — Evaluation (Signed)
Occupational Therapy Evaluation Patient Details Name: Jose Bush MRN: [id number] DOB: 09-Feb-1941 Today's Date: 03/19/2018    History of Present Illness Pt s/p rt BKA. PMH - HTN, PAD, rt 2nd toe amputation, lupus, GERD, DVT   Clinical Impression   Pt was using a RW and functioning modified independently in ADL prior to admission. Presents with increased R LE pain and poor sitting and standing balance, requiring transfer equipment for safety to transfer. He needs set up to total assist for ADL. Pt will need intensive post acute rehab prior to return home with his supportive family. Will follow acutely.    Follow Up Recommendations  CIR    Equipment Recommendations  None recommended by OT    Recommendations for Other Services       Precautions / Restrictions Precautions Precautions: Fall      Mobility Bed Mobility Overal bed mobility: Needs Assistance      General bed mobility comments: pt received in chair  Transfers Overall transfer level: Needs assistance Equipment used: Ambulation equipment used Transfers: Sit to/from Stand;Stand Pivot Transfers Sit to Stand: Mod assist;+2 physical assistance Stand pivot transfers: Total assist;+2 safety/equipment(using stedy)       General transfer comment: assist to rise with use of stedy    Balance Overall balance assessment: Needs assistance Sitting-balance support: Bilateral upper extremity supported Sitting balance-Leahy Scale: Poor Sitting balance - Comments: min guard for static sitting Postural control: Right lateral lean Standing balance support: Bilateral upper extremity supported;During functional activity Standing balance-Leahy Scale: Poor Standing balance comment: Stedy and min assist for static standing                           ADL either performed or assessed with clinical judgement   ADL Overall ADL's : Needs assistance/impaired Eating/Feeding: Independent;Sitting   Grooming: Set  up;Sitting;Supervision/safety   Upper Body Bathing: Sitting;Moderate assistance   Lower Body Bathing: Sitting/lateral leans;Total assistance   Upper Body Dressing : Sitting;Moderate assistance   Lower Body Dressing: Sitting/lateral leans;Total assistance   Toilet Transfer: Total assistance;+2 for safety/equipment(with stedy)   Toileting- Clothing Manipulation and Hygiene: Total assistance;Sit to/from stand         General ADL Comments: Began instruction in compensatory strategies for ADL.     Vision Baseline Vision/History: Wears glasses Wears Glasses: At all times Patient Visual Report: No change from baseline       Perception     Praxis      Pertinent Vitals/Pain Pain Assessment: Faces Faces Pain Scale: Hurts whole lot Pain Location: RLE Pain Descriptors / Indicators: Aching;Operative site guarding;Grimacing;Guarding;Restless Pain Intervention(s): Patient requesting pain meds-RN notified;Ice applied;Monitored during session;Limited activity within patient's tolerance     Hand Dominance Right   Extremity/Trunk Assessment Upper Extremity Assessment Upper Extremity Assessment: Generalized weakness(4/5)   Lower Extremity Assessment Lower Extremity Assessment: Defer to PT evaluation RLE Deficits / Details: BKA   Cervical / Trunk Assessment Cervical / Trunk Assessment: Normal   Communication Communication Communication: No difficulties   Cognition Arousal/Alertness: Awake/alert Behavior During Therapy: WFL for tasks assessed/performed Overall Cognitive Status: Within Functional Limits for tasks assessed                                     General Comments  [name] and daughter present    Exercises    Shoulder Instructions      Home Living Family/patient expects  to be discharged to:: Private residence Living Arrangements: Spouse/significant other Available Help at Discharge: Family;Available 24 hours/day Type of Home: House Home  Access: Stairs to enter Enter[employer] CorporationEntrance Stairs-Number of Steps: 2 Entrance Stairs-Rails: Right;Left Home Layout: One level     Bathroom Shower/Tub: Walk-in shower;Tub/shower unit   Bathroom Toilet: Standard Bathroom Accessibility: Yes   Home Equipment: Walker - 2 wheels;Cane - single point;Shower seat;Bedside commode          Prior Functioning/Environment Level of Independence: Independent with assistive device(s)        Comments: Using rolling walker        OT Problem List: Decreased strength;Impaired balance (sitting and/or standing);Pain;Decreased knowledge of use of DME or AE      OT Treatment/Interventions: Self-care/ADL training;DME and/or AE instruction;Patient/family education;Balance training;Therapeutic activities;Therapeutic exercise    OT Goals(Current goals can be found in the care plan section) Acute Rehab OT Goals Patient Stated Goal: go to rehab and then home OT Goal Formulation: With patient Time For Goal Achievement: 04/02/18 Potential to Achieve Goals: Good ADL Goals Pt Will Perform Upper Body Bathing: with supervision;with set-up;sitting Pt Will Perform Lower Body Bathing: with min assist;sitting/lateral leans;with adaptive equipment Pt Will Perform Lower Body Dressing: with set-up;with supervision;with min assist;sitting/lateral leans;with adaptive equipment Pt Will Transfer to Toilet: with min assist;squat pivot transfer;bedside commode Pt Will Perform Toileting - Clothing Manipulation and hygiene: with min assist;sitting/lateral leans Pt/caregiver will Perform Home Exercise Program: Increased strength;Both right and left upper extremity;With theraband;Independently(level 3)  OT Frequency: Min 2X/week   Barriers to D/C:            Co-evaluation              AM-PAC PT "6 Clicks" Daily Activity     Outcome Measure Help from another person eating meals?: None Help from another person taking care of personal grooming?: A Little Help from another  person toileting, which includes using toliet, bedpan, or urinal?: Total Help from another person bathing (including washing, rinsing, drying)?: A Lot Help from another person to put on and taking off regular upper body clothing?: A Lot Help from another person to put on and taking off regular lower body clothing?: Total 6 Click Score: 13   End of Session Equipment Utilized During Treatment: Gait belt Nurse Communication: Patient requests pain meds  Activity Tolerance: Patient limited by fatigue;Patient limited by pain Patient left: in chair;with call bell/phone within reach;with family/visitor present  OT Visit Diagnosis: Unsteadiness on feet (R26.81);Muscle weakness (generalized) (M62.81);Pain                Time: 1350-1418 OT Time Calculation (min): 28 min Charges:  OT General Charges $OT Visit: 1 Visit OT Evaluation $OT Eval Moderate Complexity: 1 Mod OT Treatments $Self Care/Home Management : 8-22 mins G-Codes:     03/19/2018 Martie RoundJulie Alysha Doolan, OTR/L Pager: [phone]  Iran PlanasMayberry, [place] BailiffJulie Lynn 03/19/2018, 2:35 PM

## 2018-03-19 NOTE — Progress Notes (Signed)
Inpatient Rehabilitation Admissions Coordinator  I met with patient and his wife at bedside. We discussed goals and expectation of an inpt rehab admit. They now prefer an inpt rehab admit rather than SNF closer to home due to the intensity and medical follow up offered. I will begin insurance approval with [facility] Medicare for a possible admit pending insurance approval. I also explained that pt's wife is allowed to stay with him in CIR as much as she wants, including overnight.  Danne Baxter, RN, MSN Rehab Admissions Coordinator [phone] 03/19/2018 3:18 PM

## 2018-03-20 LAB — CBC
HCT: 26.9 % — ABNORMAL LOW (ref 39.0–52.0)
HEMOGLOBIN: 8.4 g/dL — AB (ref 13.0–17.0)
MCH: 28.7 pg (ref 26.0–34.0)
MCHC: 31.2 g/dL (ref 30.0–36.0)
MCV: 91.8 fL (ref 78.0–100.0)
Platelets: 215 10*3/uL (ref 150–400)
RBC: 2.93 MIL/uL — ABNORMAL LOW (ref 4.22–5.81)
RDW: 13.9 % (ref 11.5–15.5)
WBC: 10 10*3/uL (ref 4.0–10.5)

## 2018-03-20 LAB — BASIC METABOLIC PANEL
ANION GAP: 9 (ref 5–15)
BUN: 17 mg/dL (ref 6–20)
CO2: 25 mmol/L (ref 22–32)
CREATININE: 0.79 mg/dL (ref 0.61–1.24)
Calcium: 8.5 mg/dL — ABNORMAL LOW (ref 8.9–10.3)
Chloride: 103 mmol/L (ref 101–111)
GFR calc Af Amer: >60 mL/min (ref >60)
GFR calc non Af Amer: >60 mL/min (ref >60)
GLUCOSE: 107 mg/dL — AB (ref 65–99)
Potassium: 4.2 mmol/L (ref 3.5–5.1)
Sodium: 137 mmol/L (ref 135–145)

## 2018-03-20 MED ORDER — WARFARIN - PHYSICIAN DOSING INPATIENT: Freq: Every day | Status: DC

## 2018-03-20 MED ORDER — TAMSULOSIN HCL 0.4 MG PO CAPS: 0.4000 mg | ORAL_CAPSULE | Freq: Every day | ORAL | Status: DC

## 2018-03-20 MED ORDER — WARFARIN SODIUM 5 MG PO TABS
5.0000 mg | ORAL_TABLET | Freq: Every day | ORAL | Status: DC
  Administered 2018-03-20: 5 mg via ORAL
  Filled 2018-03-20: qty 1

## 2018-03-20 NOTE — Progress Notes (Signed)
Inpatient Rehabilitation Admissions Coordinator  Noted temp last pm. I await insurance decision on rehab venue, but would not consider admit today with fever last pm. I briefly met with pt at bedside as he was working with OT to encourage use of incentive spirometer frequently.  Danne Baxter, RN, MSN Rehab Admissions Coordinator [phone] 03/20/2018 10:05 AM

## 2018-03-20 NOTE — Discharge Summary (Signed)
Discharge Summary    GREGGORY SAFRANEK 20-Apr-1941 77 y.o. male  [id number]  Admission Date: 03/18/2018  Discharge Date: 03/24/18  Physician: Sherren Kerns, MD  Admission Diagnosis: nonviable ischemic right leg   HPI:   This is a 77 y.o. male who presents to clinic today due to increase in pain and redness in right foot.  Patient is status post right lower extremity arteriogram with balloon angioplasty of posterior tibial artery by Dr. Randie Heinz on 02/17/2018 and subsequent second toe amputation with Dr. Darrick Penna on 03/03/2018.  Past medical history significant for lupus for which he takes immunosuppressive agent Plaquenil.  He was discharged on 03/05/2018 with a doxycycline regimen which she has been taking religiously.  Over the last several days patient has noticed an increase in pain to his right foot as well as redness on the forefoot and concurrent thin clear drainage from amputation site.  Patient denies fevers, chills, loss of appetite, nausea/vomiting.  Patient is on Coumadin for history of DVT and left lower extremity.  He continues to ambulate using a Darco shoe.   Hospital Course:  The patient was admitted to the hospital and taken to the operating room on 03/18/2018 and underwent: Right below knee amputation.    #1 calcified vessels, well perfused muscle tissue   The pt tolerated the procedure well and was transported to the PACU in good condition.   The pt had increased fevers for the first few days after surgery.  Blood cx and urine cx were obtained and were all negative.  Cxr was also negative.  He was encouraged to use incentive spirometer every hour.  He has been afebrile for greater than 24 hours at discharge.    His stump does have some duskiness on the lateral edge but this should improve over time. He did have some significant edema in his stump and this has somewhat improved as well.  He did have some bloody drainage on POD 6 but no purulence noted.   The remainder of  the hospital course consisted of increasing mobilization and increasing intake of solids without difficulty.  CBC    Component Value Date/Time   WBC 6.7 03/23/2018 0503   RBC 3.19 (L) 03/23/2018 0503   HGB 9.0 (L) 03/23/2018 0503   HCT 28.7 (L) 03/23/2018 0503   PLT 244 03/23/2018 0503   MCV 90.0 03/23/2018 0503   MCH 28.2 03/23/2018 0503   MCHC 31.4 03/23/2018 0503   RDW 13.7 03/23/2018 0503   LYMPHSABS 0.9 02/05/2013 0640   MONOABS 0.4 02/05/2013 0640   EOSABS 0.0 02/05/2013 0640   BASOSABS 0.0 02/05/2013 0640    BMET    Component Value Date/Time   NA 137 03/20/2018 0350   K 4.2 03/20/2018 0350   CL 103 03/20/2018 0350   CO2 25 03/20/2018 0350   GLUCOSE 107 (H) 03/20/2018 0350   BUN 17 03/20/2018 0350   CREATININE 0.79 03/20/2018 0350   CALCIUM 8.5 (L) 03/20/2018 0350   GFRNONAA >60 03/20/2018 0350   GFRAA >60 03/20/2018 0350      Discharge Instructions    Discharge patient   Complete by:  As directed    Discharge disposition:  03-Skilled Nursing Facility   Discharge patient date:  03/24/2018      Discharge Diagnosis:  nonviable ischemic right leg  Secondary Diagnosis: Patient Active Problem List   Diagnosis Date Noted  . Non-healing wound of lower extremity 03/18/2018  . Nonhealing surgical wound 03/12/2018  . PAD (peripheral artery disease) (  HCC) 03/03/2018  . PVD (peripheral vascular disease) (HCC) 03/03/2018  . Iron deficiency anemia   . Atrophic gastritis without hemorrhage   . OA (osteoarthritis) of hip 05/01/2017  . OA (osteoarthritis) of knee 12/13/2014  . Other pancytopenia (HCC) 02/04/2013  . Fever 02/02/2013  . Neutropenia (HCC) 02/02/2013  . Weakness 02/01/2013  . Rigors 02/01/2013  . Headache(784.0) 02/01/2013  . Lupus (HCC) 02/01/2013  . Thrombocytopenia (HCC) 02/01/2013  . DVT (deep venous thrombosis) (HCC)   . HYPERTENSION, UNSPECIFIED 06/22/2009  . CHEST PAIN-UNSPECIFIED 06/22/2009   Past Medical History:  Diagnosis Date  .  Arthritis   . Chest pain, unspecified   . DVT (deep venous thrombosis) (HCC)   . GERD (gastroesophageal reflux disease)   . Heart murmur    hx of   . History of hiatal hernia   . HTN (hypertension)   . Lupus (HCC)   . Lupus (HCC)   . Peripheral vascular disease (HCC)    nonviable tissue   . Shingles    hx of   . Stroke [facility]) 2010   'MILD" per pt-  weakness left side  . Tinnitus   . Ulcer of abdomen wall (HCC)   . Wears dentures   . Wears glasses      Allergies as of 03/24/2018   No Known Allergies     Medication List    STOP taking these medications   doxycycline 100 MG tablet Commonly known as:  VIBRA-TABS     TAKE these medications   acetaminophen 500 MG tablet Commonly known as:  TYLENOL Take 500 mg by mouth every 6 (six) hours as needed for moderate pain or headache.   docusate sodium 100 MG capsule Commonly known as:  COLACE Take 100 mg by mouth daily.   ESTER C PO Take 500 mg by mouth daily.   hydroxychloroquine 200 MG tablet Commonly known as:  PLAQUENIL Take 200 mg by mouth 2 (two) times daily.   ICY HOT EX Apply 1 application topically 2 (two) times daily as needed (left knee.).   lisinopril-hydrochlorothiazide 10-12.5 MG tablet Commonly known as:  PRINZIDE,ZESTORETIC Take 1 tablet by mouth daily with breakfast.   multivitamin with minerals Tabs tablet Take 1 tablet by mouth daily. Centrum Silver   NITROSTAT 0.4 MG SL tablet Generic drug:  nitroGLYCERIN Place 0.4 mg under the tongue every 5 (five) minutes as needed for chest pain. x3 doses as needed for chest pain   OMEGA-3 KRILL OIL PO Take 1 capsule by mouth daily.   omeprazole 20 MG capsule Commonly known as:  PRILOSEC Take 20 mg by mouth daily.   oxyCODONE 5 MG immediate release tablet Commonly known as:  Oxy IR/ROXICODONE Take 1 tablet (5 mg total) by mouth every 6 (six) hours as needed.   tamsulosin 0.4 MG Caps capsule Commonly known as:  FLOMAX Take 1 capsule (0.4 mg total)  by mouth daily.   tamsulosin 0.4 MG Caps capsule Commonly known as:  FLOMAX Take 2 capsules (0.8 mg total) by mouth daily.   traMADol 50 MG tablet Commonly known as:  ULTRAM Take 1 tablet (50 mg total) by mouth every 6 (six) hours as needed for severe pain.   URINOZINC PO Take 1 capsule by mouth 2 (two) times daily.   warfarin 5 MG tablet Commonly known as:  COUMADIN Take 1 tablet (5 mg total) by mouth every morning. Take Coumadin for three weeks for postoperative protocol and then the patient may resume their previous Coumadin home regimen.  The dose may need to be adjusted based upon the INR.  Please follow the INR and titrate Coumadin dose for a therapeutic range between 2.0 and 3.0 INR.  After completing the three weeks of Coumadin, the patient may resume their previous Coumadin home regimen. What changed:    when to take this  additional instructions       Prescriptions given: Flomax 0.8mg  daily (No Rx given as pt going to rehab) Tramadol 50mg  q6h prn pain #20 No Refill  [name] will call Dr. Dimas AguasHoward for further recommendations for coumadin dosing and they will be forwarded to rehab facility.   Disposition: SNF  Patient's condition: is Good  Follow up: 1. Dr. Darrick PennaFields in 4 weeks  Instructions:   1. Pt will need voiding trial in a couple of days as he has BPH and had urinary retention.  May need f/u with urologist in future.  2. Ace wrap to stump for edema-mild compression. 3. Elevate stump when not mobilizing.    Doreatha MassedSamantha Rhyne, PA-C [facility] [phone] 03/24/2018  8:10 AM

## 2018-03-20 NOTE — Progress Notes (Signed)
Physical Therapy Treatment Patient Details Name: Jose Bush Trouten MRN: [id number] DOB: 1941/02/16 Today's Date: 03/20/2018    History of Present Illness Pt s/p rt BKA. PMH - HTN, PAD, rt 2nd toe amputation, lupus, GERD, DVT    PT Comments    Pt progressing well with mobility. He is very motivated. Mod assist required for bed mobility, +2 mod assist sit to stand with RW and min assist lateral scoot transfer. He reports decreased pain today. PT to continue per POC with plans for d/c to inpt rehab.    Follow Up Recommendations  CIR     Equipment Recommendations  Wheelchair (measurements PT);Wheelchair cushion (measurements PT)    Recommendations for Other Services       Precautions / Restrictions Precautions Precautions: Fall Restrictions Weight Bearing Restrictions: Yes RLE Weight Bearing: Non weight bearing    Mobility  Bed Mobility Overal bed mobility: Needs Assistance Bed Mobility: Supine to Sit     Supine to sit: Mod assist     General bed mobility comments: +rail, cues for sequencing, increased time  Transfers Overall transfer level: Needs assistance Equipment used: Rolling walker (2 wheeled) Transfers: Sit to/from Stand;Lateral/Scoot Transfers Sit to Stand: Mod assist;+2 physical assistance        Lateral/Scoot Transfers: Min assist General transfer comment: sit to stand with RW x 2 trial. Cues for hand placement and posture. Lateral scoot transfer bed to drop-arm recliner toward left. Increased time and max verbal cues for sequencing.   Ambulation/Gait                 Stairs             Wheelchair Mobility    Modified Rankin (Stroke Patients Only)       Balance Overall balance assessment: Needs assistance Sitting-balance support: No upper extremity supported;Feet supported Sitting balance-Leahy Scale: Fair Sitting balance - Comments: worked on reaching in small ranges   Standing balance support: Bilateral upper extremity  supported;During functional activity Standing balance-Leahy Scale: Poor Standing balance comment: requires external assist and use of B UEs on walker, flexed posture                            Cognition Arousal/Alertness: Awake/alert Behavior During Therapy: WFL for tasks assessed/performed Overall Cognitive Status: Within Functional Limits for tasks assessed                                        Exercises Amputee Exercises Quad Sets: AROM;Right;10 reps Knee Flexion: AROM;10 reps;Right Knee Extension: AROM;Right;10 reps Straight Leg Raises: AROM;Right;5 reps    General Comments        Pertinent Vitals/Pain Pain Assessment: Faces Faces Pain Scale: Hurts a little bit Pain Location: RLE Pain Descriptors / Indicators: Sore Pain Intervention(s): Monitored during session    Home Living                      Prior Function            PT Goals (current goals can now be found in the care plan section) Acute Rehab PT Goals Patient Stated Goal: go to rehab and then home PT Goal Formulation: With patient Time For Goal Achievement: 04/02/18 Potential to Achieve Goals: Good Progress towards PT goals: Progressing toward goals    Frequency    Min 3X/week  PT Plan Current plan remains appropriate    Co-evaluation PT/OT/SLP Co-Evaluation/Treatment: Yes Reason for Co-Treatment: For patient/therapist safety PT goals addressed during session: Mobility/safety with mobility;Balance OT goals addressed during session: ADL's and self-care      AM-PAC PT "6 Clicks" Daily Activity  Outcome Measure  Difficulty turning over in bed (including adjusting bedclothes, sheets and blankets)?: A Lot Difficulty moving from lying on back to sitting on the side of the bed? : Unable Difficulty sitting down on and standing up from a chair with arms (e.g., wheelchair, bedside commode, etc,.)?: Unable Help needed moving to and from a bed to chair  (including a wheelchair)?: A Lot Help needed walking in hospital room?: Total Help needed climbing 3-5 steps with a railing? : Total 6 Click Score: 8    End of Session Equipment Utilized During Treatment: Gait belt Activity Tolerance: Patient tolerated treatment well Patient left: in chair;with call bell/phone within reach;with family/visitor present Nurse Communication: Mobility status;Need for lift equipment PT Visit Diagnosis: Other abnormalities of gait and mobility (R26.89);Difficulty in walking, not elsewhere classified (R26.2) Pain - Right/Left: Right Pain - part of body: Leg     Time: 2956-2130 PT Time Calculation (min) (ACUTE ONLY): 27 min  Charges:  $Therapeutic Activity: 8-22 mins                    G Codes:       Aida Raider, PT  Office # [phone] Pager [phone]    Ilda Foil 03/20/2018, 11:06 AM

## 2018-03-20 NOTE — Progress Notes (Signed)
Inpatient Rehabilitation Admissions Coordinator  [facility] for an inpt rehab admit. They feel his care can be managed at SNF level care. I have contacted pt's wife by phone and she is aware. If Dr. Darrick PennaFields would like to appeal with a peer to peer [facility], please call me as early as possible in the am and I can arrange. Otherwise insurance will approve SNF rehab. I have alerted SW, Morrie Sheldonshley, by text. Please call me with any questions.  Ottie GlazierBarbara Feleshia Zundel, RN, MSN Rehab Admissions Coordinator [phone] 03/20/2018 5:05 PM

## 2018-03-20 NOTE — Progress Notes (Signed)
Occupational Therapy Treatment Patient Details Name: Jose Bush MRN: [id number] DOB: 1941/03/13 Today's Date: 03/20/2018    History of present illness Pt s/p rt BKA. PMH - HTN, PAD, rt 2nd toe amputation, lupus, GERD, DVT   OT comments  Pt is highly motivated and works hard in therapy. Improvement noted in sitting balance. Stood x 2 with RW and 2 person assist, but not able to pivot or hop on L LE yet. Performed lateral transfer with min assist. Set up for grooming and min assist required for grooming and dressing. Continue to recommend intensive rehab.  Follow Up Recommendations  CIR    Equipment Recommendations  Wheelchair (measurements OT);Wheelchair cushion (measurements OT)(drop arm commode)    Recommendations for Other Services      Precautions / Restrictions Precautions Precautions: Fall       Mobility Bed Mobility Overal bed mobility: Needs Assistance Bed Mobility: Supine to Sit     Supine to sit: Mod assist     General bed mobility comments: min assist to raise trunk and advance hips to EOB, cues for technique, increased time  Transfers Overall transfer level: Needs assistance Equipment used: Rolling walker (2 wheeled) Transfers: Sit to/from Stand;Lateral/Scoot Transfers Sit to Stand: Mod assist;+2 physical assistance        Lateral/Scoot Transfers: Min assist General transfer comment: assist to rise and steady from elevated bed, cues for hand placement, performed sit<>stand x 2, pt unable to pivot on L LE so went to lateral scoot transfer with tactile and verbal cues and extra time to drop arm recliner    Balance Overall balance assessment: Needs assistance Sitting-balance support: No upper extremity supported Sitting balance-Leahy Scale: Fair Sitting balance - Comments: worked on reaching in small ranges     Standing balance-Leahy Scale: Poor Standing balance comment: requires external assist and use of B UEs on walker, flexed posture                            ADL either performed or assessed with clinical judgement   ADL Overall ADL's : Needs assistance/impaired     Grooming: Wash/dry hands;Wash/dry face;Brushing hair;Oral care;Sitting;Set up           Upper Body Dressing : Minimal assistance;Sitting                           Vision       Perception     Praxis      Cognition Arousal/Alertness: Awake/alert Behavior During Therapy: WFL for tasks assessed/performed Overall Cognitive Status: Within Functional Limits for tasks assessed                                          Exercises     Shoulder Instructions       General Comments      Pertinent Vitals/ Pain       Pain Assessment: Faces Faces Pain Scale: Hurts a little bit Pain Location: RLE Pain Descriptors / Indicators: Sore Pain Intervention(s): Monitored during session  Home Living                                          Prior Functioning/Environment  Frequency  Min 2X/week        Progress Toward Goals  OT Goals(current goals can now be found in the care plan section)  Progress towards OT goals: Progressing toward goals  Acute Rehab OT Goals Patient Stated Goal: go to rehab and then home OT Goal Formulation: With patient Time For Goal Achievement: 04/02/18 Potential to Achieve Goals: Good  Plan Discharge plan remains appropriate    Co-evaluation    PT/OT/SLP Co-Evaluation/Treatment: Yes Reason for Co-Treatment: For patient/therapist safety   OT goals addressed during session: ADL's and self-care      AM-PAC PT "6 Clicks" Daily Activity     Outcome Measure   Help from another person eating meals?: None Help from another person taking care of personal grooming?: A Little Help from another person toileting, which includes using toliet, bedpan, or urinal?: Total Help from another person bathing (including washing, rinsing, drying)?: A Lot Help from  another person to put on and taking off regular upper body clothing?: A Little Help from another person to put on and taking off regular lower body clothing?: Total 6 Click Score: 14    End of Session    OT Visit Diagnosis: Unsteadiness on feet (R26.81);Muscle weakness (generalized) (M62.81);Pain   Activity Tolerance Patient tolerated treatment well   Patient Left in chair;with call bell/phone within reach;with family/visitor present   Nurse Communication Mobility status        Time: 0981-1914 OT Time Calculation (min): 37 min  Charges: OT General Charges $OT Visit: 1 Visit OT Treatments $Self Care/Home Management : 8-22 mins  03/20/2018 Martie Round, OTR/L Pager: [phone]   Iran Planas, [place] Bailiff 03/20/2018, 10:35 AM

## 2018-03-20 NOTE — Progress Notes (Addendum)
  Progress Note    03/20/2018 7:09 AM 2 Days Post-Op  Subjective:  Says he did good with PT yesterday but got sick afterwards.  Feels better  Now.  Wife says he had a fever and they just drew blood cx.   Tm 102 now afebrile 110's-150's systolic HR 60's-100's NSR 98% RA  Vitals:   03/20/18 0021 03/20/18 0514  BP: (!) 124/55 (!) 119/56  Pulse: 85 66  Resp: 19 14  Temp: 98.4 F (36.9 C) 98.2 F (36.8 C)  SpO2: 99% 96%    Physical Exam: Incisions:  Clean and dry with mild ecchymosis on the lateral aspect of the incision.  +edema in stump.  Able to lift leg without difficulty.  Knee is straight.    CBC    Component Value Date/Time   WBC 10.0 03/20/2018 0350   RBC 2.93 (L) 03/20/2018 0350   HGB 8.4 (L) 03/20/2018 0350   HCT 26.9 (L) 03/20/2018 0350   PLT 215 03/20/2018 0350   MCV 91.8 03/20/2018 0350   MCH 28.7 03/20/2018 0350   MCHC 31.2 03/20/2018 0350   RDW 13.9 03/20/2018 0350   LYMPHSABS 0.9 02/05/2013 0640   MONOABS 0.4 02/05/2013 0640   EOSABS 0.0 02/05/2013 0640   BASOSABS 0.0 02/05/2013 0640    BMET    Component Value Date/Time   NA 137 03/20/2018 0350   K 4.2 03/20/2018 0350   CL 103 03/20/2018 0350   CO2 25 03/20/2018 0350   GLUCOSE 107 (H) 03/20/2018 0350   BUN 17 03/20/2018 0350   CREATININE 0.79 03/20/2018 0350   CALCIUM 8.5 (L) 03/20/2018 0350   GFRNONAA >60 03/20/2018 0350   GFRAA >60 03/20/2018 0350    INR    Component Value Date/Time   INR 1.54 03/19/2018 0320     Intake/Output Summary (Last 24 hours) at 03/20/2018 0709 Last data filed at 03/20/2018 0520 Gross per 24 hour  Intake 560 ml  Output 2125 ml  Net -1565 ml     Assessment/Plan:  77 y.o. male is s/p right below knee amputation  2 Days Post-Op  -stump looks good - small purplish area on the lateral aspect of incision--continue to monitor.  -fever last night Tm 102--most likely atelectasis but blood cx were drawn this morning.  -discontinue foley-flomax started  yesterday. -pt and wife now willing to consider CIR and process for insurance approval is in process.  -restart coumadin; daily INR   Doreatha MassedSamantha Rhyne, PA-C [facility] [phone] 03/20/2018 7:09 AM  Agree with above.  No leukocytosis.  Urine negative.  Most likely fever was atelectasis.  Incentive spirometer.  Pain improved.  Incision looks good.  Maybe rehab tomorrow  Fabienne Brunsharles Fields, MD [facility] [place] Office: [phone] Pager: [phone]

## 2018-03-21 LAB — URINE CULTURE: CULTURE: NO GROWTH

## 2018-03-21 LAB — PROTIME-INR
INR: 1.33
PROTHROMBIN TIME: 16.4 s — AB (ref 11.4–15.2)

## 2018-03-21 MED ORDER — TRAMADOL HCL 50 MG PO TABS
50.0000 mg | ORAL_TABLET | Freq: Four times a day (QID) | ORAL | Status: DC | PRN
  Administered 2018-03-21 – 2018-03-24 (×7): 50 mg via ORAL
  Filled 2018-03-21 (×9): qty 1

## 2018-03-21 MED ORDER — WARFARIN SODIUM 5 MG PO TABS
5.0000 mg | ORAL_TABLET | Freq: Every day | ORAL | Status: DC
  Administered 2018-03-22 – 2018-03-23 (×2): 5 mg via ORAL
  Filled 2018-03-21 (×2): qty 1

## 2018-03-21 MED ORDER — WARFARIN SODIUM 7.5 MG PO TABS
7.5000 mg | ORAL_TABLET | Freq: Once | ORAL | Status: AC
Start: 2018-03-21 — End: 2018-03-21
  Administered 2018-03-21: 7.5 mg via ORAL
  Filled 2018-03-21: qty 1

## 2018-03-21 NOTE — Clinical Social Work Note (Signed)
Clinical Social Work Assessment  Patient Details  Name: Jose Bush MRN: [id number] Date of Birth: 1941-06-13  Date of referral:  03/21/18               Reason for consult:  Discharge Planning, Facility Placement                Permission sought to share information with:  Family Supports Permission granted to share information::  Yes, Verbal Permission Granted  Name::     Leonia CoronaShirley Govoni  Agency::  snf  Relationship::  [phone]  Contact Information:  spouse  Housing/Transportation Living arrangements for the past 2 months:  Single Family Home Source of Information:  Patient, Spouse, Adult Children Patient Interpreter Needed:  None Criminal Activity/Legal Involvement Pertinent to Current Situation/Hospitalization:  No - Comment as needed Significant Relationships:  Adult Children, Spouse Lives with:  Self Do you feel safe going back to the place where you live?  Yes Need for family participation in patient care:  Yes (Comment)  Care giving concerns:  Patient had wife at bedside and adult daughter came into the room half way through assessment. Patient stated he lives at home with wife and has adult children in the area that are supportive   Social Worker assessment / plan:  Patient and spouse stated they are agreeable to go to rehab and [facility] is [place] 5 minutes away from their home. CSW informed family she has already reached out to facility and they are agreeable to accept patient as long as Berkley Harveyauth has been attained. Family stated they are grateful for CSW role in care. Family requested that CSW please get private room for patient and stated they understand it is more money but are okay with it.  Employment status:  Retired Database administratornsurance information:  Managed Medicare PT Recommendations:  Skilled Nursing Facility Information / Referral to community resources:  Skilled Nursing Facility  Patient/Family's Response to care: Family supportive of patient and  his needs Patient/Family's Understanding of and Emotional Response to Diagnosis, Current Treatment, and Prognosis:  Patient and family are supportive of patients decision to transition to rehab.   Emotional Assessment Appearance:  Appears stated age Attitude/Demeanor/Rapport:  Engaged Affect (typically observed):  Accepting, Pleasant Orientation:  Oriented to Self, Oriented to Situation, Oriented to Place, Oriented to  Time Alcohol / Substance use:  Not Applicable Psych involvement (Current and /or in the community):  No (Comment)  Discharge Needs  Concerns to be addressed:  Care Coordination Readmission within the last 30 days:  No Current discharge risk:  None Barriers to Discharge:  Continued Medical Work up   Jabil Circuitshley C Lindel Marcell, LCSW 03/21/2018, 12:16 PM

## 2018-03-21 NOTE — NC FL2 (Signed)
[place] MEDICAID FL2 LEVEL OF CARE SCREENING TOOL     IDENTIFICATION  Patient Name: Jose Bush Birthdate: 04/14/1941 Sex: male Admission Date (Current Location): 03/18/2018  [facility] and [place] Number:  Producer, television/film/video and Address:  The [place]. [facility], [address], [place], [place] [address]      Provider Number: [id number]  Attending Physician Name and Address:  Sherren Kerns, MD  Relative Name and Phone Number:  Samyak Sackmann, [phone]    Current Level of Care: Hospital Recommended Level of Care: Skilled Nursing Facility Prior Approval Number:    Date Approved/Denied:   PASRR Number: [id number]  Discharge Plan: SNF    Current Diagnoses: Patient Active Problem List   Diagnosis Date Noted  . Non-healing wound of lower extremity 03/18/2018  . Nonhealing surgical wound 03/12/2018  . PAD (peripheral artery disease) (HCC) 03/03/2018  . PVD (peripheral vascular disease) (HCC) 03/03/2018  . Iron deficiency anemia   . Atrophic gastritis without hemorrhage   . OA (osteoarthritis) of hip 05/01/2017  . OA (osteoarthritis) of knee 12/13/2014  . Other pancytopenia (HCC) 02/04/2013  . Fever 02/02/2013  . Neutropenia (HCC) 02/02/2013  . Weakness 02/01/2013  . Rigors 02/01/2013  . Headache(784.0) 02/01/2013  . Lupus (HCC) 02/01/2013  . Thrombocytopenia (HCC) 02/01/2013  . DVT (deep venous thrombosis) (HCC)   . HYPERTENSION, UNSPECIFIED 06/22/2009  . CHEST PAIN-UNSPECIFIED 06/22/2009    Orientation RESPIRATION BLADDER Height & Weight     Self, Time, Situation, Place  Normal Continent Weight: 207 lb (93.9 kg) Height:  5\' 8"  (172.7 cm)  BEHAVIORAL SYMPTOMS/MOOD NEUROLOGICAL BOWEL NUTRITION STATUS      Continent Diet(heart healthy)  AMBULATORY STATUS COMMUNICATION OF NEEDS Skin   Extensive Assist Verbally Surgical wounds                       Personal Care Assistance Level of Assistance  Bathing, Feeding, Dressing Bathing  Assistance: Limited assistance Feeding assistance: Independent Dressing Assistance: Limited assistance     Functional Limitations Info  Sight, Hearing, Speech Sight Info: Adequate Hearing Info: Adequate Speech Info: Adequate    SPECIAL CARE FACTORS FREQUENCY  PT (By licensed PT), OT (By licensed OT)     PT Frequency: 5x wk OT Frequency: 5x wk            Contractures Contractures Info: Not present    Additional Factors Info  Code Status, Allergies Code Status Info: full code Allergies Info: No known allergies           Current Medications (03/21/2018):  This is the current hospital active medication list Current Facility-Administered Medications  Medication Dose Route Frequency Provider Last Rate Last Dose  . 0.9 %  sodium chloride infusion   Intravenous Continuous Rhyne, Ames Coupe, PA-C   Stopped at 03/18/18 1720  . acetaminophen (TYLENOL) tablet 325-650 mg  325-650 mg Oral Q4H PRN Dara Lords, PA-C   650 mg at 03/20/18 2128   Or  . acetaminophen (TYLENOL) suppository 325-650 mg  325-650 mg Rectal Q4H PRN Rhyne, Samantha J, PA-C      . alum & mag hydroxide-simeth (MAALOX/MYLANTA) 200-200-20 MG/5ML suspension 15-30 mL  15-30 mL Oral Q2H PRN Rhyne, Samantha J, PA-C      . bisacodyl (DULCOLAX) suppository 10 mg  10 mg Rectal Daily PRN Rhyne, Samantha J, PA-C      . docusate sodium (COLACE) capsule 100 mg  100 mg Oral Daily Rhyne, Ames Coupe, PA-C  100 mg at 03/20/18 0811  . guaiFENesin-dextromethorphan (ROBITUSSIN DM) 100-10 MG/5ML syrup 15 mL  15 mL Oral Q4H PRN Rhyne, Samantha J, PA-C      . heparin injection 5,000 Units  5,000 Units Subcutaneous Q8H Rhyne, Samantha J, PA-C   5,000 Units at 03/21/18 0608  . hydrALAZINE (APRESOLINE) injection 5 mg  5 mg Intravenous Q20 Min PRN Rhyne, Samantha J, PA-C      . lisinopril (PRINIVIL,ZESTRIL) tablet 10 mg  10 mg Oral Q breakfast Sherren KernsFields, Charles E, MD   10 mg at 03/20/18 [id number]   And  . hydrochlorothiazide (MICROZIDE)  capsule 12.5 mg  12.5 mg Oral Q breakfast Sherren KernsFields, Charles E, MD   12.5 mg at 03/20/18 0810  . hydroxychloroquine (PLAQUENIL) tablet 200 mg  200 mg Oral BID Dara LordsRhyne, Samantha J, PA-C   200 mg at 03/20/18 2128  . labetalol (NORMODYNE,TRANDATE) injection 10 mg  10 mg Intravenous Q10 min PRN Rhyne, Samantha J, PA-C      . magnesium sulfate IVPB 2 g 50 mL  2 g Intravenous Daily PRN Rhyne, Samantha J, PA-C      . methocarbamol (ROBAXIN) tablet 500 mg  500 mg Oral Q6H PRN Sherren KernsFields, Charles E, MD   500 mg at 03/19/18 1533  . metoprolol tartrate (LOPRESSOR) injection 2-5 mg  2-5 mg Intravenous Q2H PRN Rhyne, Samantha J, PA-C      . morphine 2 MG/ML injection 2 mg  2 mg Intravenous Q2H PRN Rhyne, Samantha J, PA-C      . multivitamin with minerals tablet 1 tablet  1 tablet Oral Daily Rhyne, Ames CoupeSamantha J, PA-C   1 tablet at 03/20/18 0810  . nitroGLYCERIN (NITROSTAT) SL tablet 0.4 mg  0.4 mg Sublingual Q5 min PRN Rhyne, Samantha J, PA-C      . ondansetron (ZOFRAN) injection 4 mg  4 mg Intravenous Q6H PRN Rhyne, Samantha J, PA-C      . oxyCODONE (Oxy IR/ROXICODONE) immediate release tablet 5-10 mg  5-10 mg Oral Q4H PRN Rhyne, Samantha J, PA-C   10 mg at 03/21/18 0816  . pantoprazole (PROTONIX) EC tablet 40 mg  40 mg Oral Daily Rhyne, Samantha J, PA-C   40 mg at 03/20/18 0810  . phenol (CHLORASEPTIC) mouth spray 1 spray  1 spray Mouth/Throat PRN Rhyne, Samantha J, PA-C      . polyethylene glycol (MIRALAX / GLYCOLAX) packet 17 g  17 g Oral Daily PRN Rhyne, Samantha J, PA-C      . potassium chloride SA (K-DUR,KLOR-CON) CR tablet 20-40 mEq  20-40 mEq Oral Daily PRN Rhyne, Samantha J, PA-C      . tamsulosin (FLOMAX) capsule 0.4 mg  0.4 mg Oral Daily Rhyne, Samantha J, PA-C   0.4 mg at 03/20/18 0810  . [START ON 03/22/2018] warfarin (COUMADIN) tablet 5 mg  5 mg Oral q1800 Rhyne, Samantha J, PA-C      . warfarin (COUMADIN) tablet 7.5 mg  7.5 mg Oral ONCE-1800 Rhyne, Samantha J, PA-C      . Warfarin - Physician Dosing  Inpatient   Does not apply q1800 Juliette MangleBryk, Veronda P, [facility]         Discharge Medications: Please see discharge summary for a list of discharge medications.  Relevant Imaging Results:  Relevant Lab Results:   Additional Information SS# [id number]  Althea CharonAshley C Axie Hayne, LCSW

## 2018-03-21 NOTE — Progress Notes (Signed)
Physical Therapy Treatment Patient Details Name: Jose Bush MRN: [id number] DOB: Mar 25, 1941 Today's Date: 03/21/2018    History of Present Illness Pt s/p rt BKA. PMH - HTN, PAD, rt 2nd toe amputation, lupus, GERD, DVT    PT Comments    Returned to room to deliver/review HEP with pt and to assist RN with transfer back to bed. Required mod A +2 using stedy for standing from lower recliner height. Reviewed HEP and gave handout to pt. Discussed repetitions and frequency of HEP with pt and pt's wife. Current recommendations appropriate. Will continue to follow acutely to maximize functional mobility independence and safety.    Follow Up Recommendations  CIR(insurance denied CIR so will have to go SNF.)     Equipment Recommendations  Wheelchair (measurements PT);Wheelchair cushion (measurements PT)    Recommendations for Other Services       Precautions / Restrictions Precautions Precautions: Fall    Mobility  Bed Mobility Overal bed mobility: Needs Assistance Bed Mobility: Sit to Supine       Sit to supine: Min assist   General bed mobility comments: Min A for LE assist to return to supine.   Transfers Overall transfer level: Needs assistance Equipment used: Ambulation equipment used Transfers: Sit to/from Stand Sit to Stand: Mod assist;+2 physical assistance Stand pivot transfers: (using stedy )       General transfer comment: Assisted RN with transfer back to bed using stedy. Pt with increased fatigue and required mod A +2 from lower surface to stand. Cues for hip extension and to stand tall.   Ambulation/Gait                 Stairs             Wheelchair Mobility    Modified Rankin (Stroke Patients Only)       Balance Overall balance assessment: Needs assistance Sitting-balance support: No upper extremity supported;Feet supported Sitting balance-Leahy Scale: Fair     Standing balance support: Bilateral upper extremity  supported Standing balance-Leahy Scale: Poor Standing balance comment: Reliant on BUE support and external support for balance.                             Cognition Arousal/Alertness: Awake/alert Behavior During Therapy: WFL for tasks assessed/performed Overall Cognitive Status: Within Functional Limits for tasks assessed                                        Exercises Amputee Exercises Quad Sets: AROM;Right;10 reps;Supine Knee Extension: AROM;Right;10 reps;Seated Straight Leg Raises: AROM;Right;10 reps;Supine    General Comments        Pertinent Vitals/Pain Pain Assessment: Faces Faces Pain Scale: Hurts little more Pain Location: RLE Pain Descriptors / Indicators: Sore Pain Intervention(s): Limited activity within patient's tolerance;Monitored during session;Repositioned    Home Living                      Prior Function            PT Goals (current goals can now be found in the care plan section) Acute Rehab PT Goals Patient Stated Goal: go to rehab and then home PT Goal Formulation: With patient Time For Goal Achievement: 04/02/18 Potential to Achieve Goals: Good Progress towards PT goals: Progressing toward goals    Frequency    Min 3X/week  PT Plan Current plan remains appropriate    Co-evaluation              AM-PAC PT "6 Clicks" Daily Activity  Outcome Measure  Difficulty turning over in bed (including adjusting bedclothes, sheets and blankets)?: A Lot Difficulty moving from lying on back to sitting on the side of the bed? : Unable Difficulty sitting down on and standing up from a chair with arms (e.g., wheelchair, bedside commode, etc,.)?: Unable Help needed moving to and from a bed to chair (including a wheelchair)?: A Lot Help needed walking in hospital room?: Total Help needed climbing 3-5 steps with a railing? : Total 6 Click Score: 8    End of Session   Activity Tolerance: Patient  tolerated treatment well Patient left: in bed;with call bell/phone within reach;with family/visitor present Nurse Communication: Mobility status;Need for lift equipment PT Visit Diagnosis: Other abnormalities of gait and mobility (R26.89);Difficulty in walking, not elsewhere classified (R26.2) Pain - Right/Left: Right Pain - part of body: Leg     Time: [id number] PT Time Calculation (min) (ACUTE ONLY): 13 min  Charges:  $Therapeutic Activity: 8-22 mins                    G Codes:       Gladys DammeBrittany Crockett Rallo, PT, DPT  Acute Rehabilitation Services  Pager: [phone]    Lehman PromBrittany S Shann Merrick 03/21/2018, 6:14 PM

## 2018-03-21 NOTE — Progress Notes (Addendum)
  Progress Note    03/21/2018 7:51 AM 3 Days Post-Op  Subjective:  Doing bed exercises this morning; feels better but having some pain as it is time for pain med  Tm 99.8 now afebrile HR 60's-80's NSR 110's-130's systolic 98% RA  Vitals:   03/20/18 2005 03/21/18 0341  BP: (!) 136/59 133/60  Pulse: 94 75  Resp: (!) 22 20  Temp: 99.1 F (37.3 C) 98.8 F (37.1 C)  SpO2: 95% 96%    Physical Exam: Incisions:  Covered with ace  Wrap-bandage is clean and dry   CBC    Component Value Date/Time   WBC 10.0 03/20/2018 0350   RBC 2.93 (L) 03/20/2018 0350   HGB 8.4 (L) 03/20/2018 0350   HCT 26.9 (L) 03/20/2018 0350   PLT 215 03/20/2018 0350   MCV 91.8 03/20/2018 0350   MCH 28.7 03/20/2018 0350   MCHC 31.2 03/20/2018 0350   RDW 13.9 03/20/2018 0350   LYMPHSABS 0.9 02/05/2013 0640   MONOABS 0.4 02/05/2013 0640   EOSABS 0.0 02/05/2013 0640   BASOSABS 0.0 02/05/2013 0640    BMET    Component Value Date/Time   NA 137 03/20/2018 0350   K 4.2 03/20/2018 0350   CL 103 03/20/2018 0350   CO2 25 03/20/2018 0350   GLUCOSE 107 (H) 03/20/2018 0350   BUN 17 03/20/2018 0350   CREATININE 0.79 03/20/2018 0350   CALCIUM 8.5 (L) 03/20/2018 0350   GFRNONAA >60 03/20/2018 0350   GFRAA >60 03/20/2018 0350    INR    Component Value Date/Time   INR 1.33 03/21/2018 0507     Intake/Output Summary (Last 24 hours) at 03/21/2018 0751 Last data filed at 03/21/2018 0351 Gross per 24 hour  Intake 340 ml  Output 1150 ml  Net -810 ml     Assessment/Plan:  77 y.o. male is s/p right below knee amputation  3 Days Post-Op  -pt doing well this am -insurance denied CIR -will need social work for placement -blood cx x 1 with no growth so far; other is in process; urine cx in process. -still with low grade fever last night-continue working on IS and mobilizing.   -was able to void after foley was removed.  -INR 1.33 today-will give coumadin 7.5mg  today as pt says his MD usually starts him  out on 10 and then back to 5mg .   Doreatha MassedSamantha Rhyne, PA-C [facility] [phone] 03/21/2018 7:51 AM   Mosetta PigeonEmma Maureen Collins PA-C Changed pain medication from oxycodone to tramadol per patient request.  He will be here over the weekend and continue to work with PT/OT for mobility. Pending SNF placement. Ordered CBC for next 2 days to monitor HGB, currently 8.4 today, asymptomatic.  Surgical EBL 250 cc.  Agree with above.  Lateral edge of BKA still dusky will need a few weeks to declare.  SNF vs Rehab Monday  Fabienne Brunsharles Akiva Josey, MD [facility] [place] Office: [phone] Pager: [phone]

## 2018-03-21 NOTE — Consult Note (Signed)
   [facility]   03/21/2018  Clearnce HastenWilliam L Garlitz 07-Sep-1941 [id number]  Patient screened for potential [facility] Care Management services.  Patient's currently is for Skilled nursing facility as CIR was denied per notes. Risk score currently low at 14 % for unplanned readmissions.  Patient with a right below the knee amputation.   No community care management needs noted at this time. Patient is in the [facility] of the [facility] Care Management services under [facility]'s  [facility] plan.   Please place a [facility] Care [facility] consult or for questions contact:   Charlesetta ShanksVictoria Akaash Vandewater, RN BSN CCM [facility] Lia[facility]  [phone] business mobile phone Toll free office [phone]

## 2018-03-21 NOTE — Progress Notes (Addendum)
Physical Therapy Treatment Patient Details Name: Jose Bush MRN: [id number] DOB: [birth year] Today's Date: 03/21/2018    History of Present Illness Pt s/p rt BKA. PMH - HTN, PAD, rt 2nd toe amputation, lupus, GERD, DVT    PT Comments    Pt making good progress with mobility. [name] anxious about dc plan since pt was denied CIR. Pt remains very motivated to return to independence.   Follow Up Recommendations  CIR(insurance denied CIR so will have to go SNF.)     Equipment Recommendations  Wheelchair (measurements PT);Wheelchair cushion (measurements PT)    Recommendations for Other Services       Precautions / Restrictions Precautions Precautions: Fall Restrictions Weight Bearing Restrictions: Yes RLE Weight Bearing: Non weight bearing    Mobility  Bed Mobility Overal bed mobility: Needs Assistance Bed Mobility: Supine to Sit     Supine to sit: Min assist     General bed mobility comments: Assist to elevate trunk into sitting  Transfers Overall transfer level: Needs assistance Equipment used: Rolling walker (2 wheeled) Transfers: Sit to/from [employer]GI CorporationStand;Stand Pivot Transfers Sit to Stand: Mod assist Stand pivot transfers: (Using [place]edy)       General transfer comment: Assist to raise hips and for balance. Stood x 3 from bed. Pt using rt hand on walker and lt hand pushing from bed. Pt with improvement with each stand. Stood for 1-2 minutes each time.  Ambulation/Gait                 Stairs             Wheelchair Mobility    Modified Rankin (Stroke Patients Only)       Balance Overall balance assessment: Needs assistance Sitting-balance support: No upper extremity supported;Feet supported Sitting balance-Leahy Scale: Fair Sitting balance - Comments: Static sitting stable but requires min guard to min assist for dynamic activities   Standing balance support: Bilateral upper extremity supported Standing balance-Leahy Scale: Poor Standing  balance comment: walker and min assist for static standing. Verbal cues to fully extend hips and trunk.                            Cognition Arousal/Alertness: Awake/alert Behavior During Therapy: WFL for tasks assessed/performed Overall Cognitive Status: Within Functional Limits for tasks assessed                                        Exercises Amputee Exercises Knee Flexion: AROM;Right;10 reps;Supine Knee Extension: AROM;Right;10 reps;Supine Straight Leg Raises: AROM;Right;10 reps;Supine    General Comments General comments (skin integrity, edema, etc.): [name] present with multiple questions about follow up therapy.      Pertinent Vitals/Pain Pain Assessment: Faces Faces Pain Scale: Hurts little more Pain Location: RLE Pain Descriptors / Indicators: Sore Pain Intervention(s): Limited activity within patient's tolerance;Monitored during session;Repositioned    Home Living                      Prior Function            PT Goals (current goals can now be found in the care plan section) Progress towards PT goals: Progressing toward goals    Frequency    Min 3X/week      PT Plan Current plan remains appropriate    Co-evaluation  AM-PAC PT "6 Clicks" Daily Activity  Outcome Measure  Difficulty turning over in bed (including adjusting bedclothes, sheets and blankets)?: A Lot Difficulty moving from lying on back to sitting on the side of the bed? : Unable Difficulty sitting down on and standing up from a chair with arms (e.g., wheelchair, bedside commode, etc,.)?: Unable Help needed moving to and from a bed to chair (including a wheelchair)?: A Lot Help needed walking in hospital room?: Total Help needed climbing 3-5 steps with a railing? : Total 6 Click Score: 8    End of Session Equipment Utilized During Treatment: Gait belt Activity Tolerance: Patient tolerated treatment well Patient left: in  chair;with call bell/phone within reach;with family/visitor present Nurse Communication: Mobility status;Need for lift equipment PT Visit Diagnosis: Other abnormalities of gait and mobility (R26.89);Difficulty in walking, not elsewhere classified (R26.2) Pain - Right/Left: Right Pain - part of body: Leg     Time: 1049-1130 PT Time Calculation (min) (ACUTE ONLY): 41 min  Charges:  $Therapeutic Exercise: 8-22 mins $Therapeutic Activity: 23-37 mins                    G Codes:       [facility] PT [phone]    Angelina Ok [facility] 03/21/2018, 12:02 PM

## 2018-03-21 NOTE — Progress Notes (Signed)
Assisted pt with toileting using stedy. Performed seated UE exercise with level 3 theraband. Tolerated well.    03/21/18 1300  OT Visit Information  Last OT Received On 03/21/18  Assistance Needed +1 (+2 to attempt ambulation)  History of Present Illness Pt s/p rt BKA. PMH - HTN, PAD, rt 2nd toe amputation, lupus, GERD, DVT  Precautions  Precautions Fall  Pain Assessment  Pain Assessment Faces  Faces Pain Scale 4  Pain Location RLE  Pain Descriptors / Indicators Sore  Pain Intervention(s) Monitored during session;Premedicated before session;Repositioned  Cognition  Arousal/Alertness Awake/alert  Behavior During Therapy WFL for tasks assessed/performed  Overall Cognitive Status Within Functional Limits for tasks assessed  ADL  Toilet Transfer Total assistance;BSC  Toilet Transfer Details (indicate cue type and reason) used stedy to assist pt to [facility]  Toileting- Clothing Manipulation and Hygiene Total assistance;Sit to/from stand  Bed Mobility  General bed mobility comments pt in chair  Transfers  Overall transfer level Needs assistance  Equipment used Ambulation equipment used  Transfer via Lift Equipment Stedy  Transfers Sit to/from Stand  Sit to Stand Mod assist  General transfer comment Assist to raise hips and for balance. Stood x 3 from bed. Pt using rt hand on walker and lt hand pushing from bed. Pt with improvement with each stand. Stood for 1-2 minutes each time.  Exercises  Exercises General Upper Extremity  General Exercises - Upper Extremity  Shoulder Flexion Strengthening;Both;10 reps;Seated;Theraband  Shoulder Extension Strengthening;Both;10 reps;Seated;Theraband  Shoulder Horizontal ABduction Strengthening;Both;10 reps;Seated;Theraband  Elbow Flexion Strengthening;Both;10 reps;Seated;Theraband  Elbow Extension Strengthening;Both;10 reps;Seated;Theraband  Theraband Level (Shoulder Flexion) Level 3 (Green)  Theraband Level (Shoulder Extension) Level 3 (Green)   Theraband Level (Shoulder Horizontal Abduction) Level 3 (Green)  Theraband Level (Elbow Flexion) Level 3 (Green)  Theraband Level (Elbow Extension) Level 3 (Green)  OT - End of Session  Equipment Utilized During Treatment Gait belt  Activity Tolerance Patient tolerated treatment well  Patient left in chair;with call bell/phone within reach  OT Assessment/Plan  OT Plan Discharge plan remains appropriate  OT Visit Diagnosis Unsteadiness on feet (R26.81);Muscle weakness (generalized) (M62.81);Pain  OT Frequency (ACUTE ONLY) Min 2X/week  Follow Up Recommendations CIR (insurance declined)  OT Equipment Wheelchair (measurements OT);Wheelchair cushion (measurements OT) (drop arm commode)  AM-PAC OT "6 Clicks" Daily Activity Outcome Measure  Help from another person eating meals? 4  Help from another person taking care of personal grooming? 3  Help from another person toileting, which includes using toliet, bedpan, or urinal? 1  Help from another person bathing (including washing, rinsing, drying)? 2  Help from another person to put on and taking off regular upper body clothing? 3  Help from another person to put on and taking off regular lower body clothing? 1  6 Click Score 14  ADL G Code Conversion CK  OT Goal Progression  Progress towards OT goals Progressing toward goals  Acute Rehab OT Goals  Patient Stated Goal go to rehab and then home  OT Goal Formulation With patient  Time For Goal Achievement 04/02/18  Potential to Achieve Goals Good  OT Time Calculation  OT Start Time (ACUTE ONLY) 1225  OT Stop Time (ACUTE ONLY) 1249  OT Time Calculation (min) 24 min  OT General Charges  $OT Visit 1 Visit  OT Treatments  $Self Care/Home Management  8-22 mins  $Therapeutic Exercise 8-22 mins  03/21/2018 Martie RoundJulie Sharifah Champine, OTR/L Pager: [phone]

## 2018-03-21 NOTE — Care Management Important Message (Signed)
Important Message  Patient Details  Name: Jose HastenWilliam L Commisso MRN: [id number] Date of Birth: Feb 29, 1941   Medicare Important Message Given:  Yes    Oralia RudMegan P Naithan Delage 03/21/2018, 12:44 PM

## 2018-03-21 NOTE — Progress Notes (Signed)
Clinical Social Worker follow patient for support and discharge needs. [facility]. Facility reached out to CSW and stated authorization through insurance has been attained and if patient is medically cleared to discharge Monday they will be able to take him.   Marrianne MoodAshley Wania Longstreth, MSW,  [employer]CSWA [phone]

## 2018-03-22 ENCOUNTER — Inpatient Hospital Stay (HOSPITAL_COMMUNITY): Payer: Medicare HMO

## 2018-03-22 LAB — CBC
HCT: 24.4 % — ABNORMAL LOW (ref 39.0–52.0)
Hemoglobin: 7.7 g/dL — ABNORMAL LOW (ref 13.0–17.0)
MCH: 28.5 pg (ref 26.0–34.0)
MCHC: 31.6 g/dL (ref 30.0–36.0)
MCV: 90.4 fL (ref 78.0–100.0)
PLATELETS: 218 10*3/uL (ref 150–400)
RBC: 2.7 MIL/uL — ABNORMAL LOW (ref 4.22–5.81)
RDW: 13.9 % (ref 11.5–15.5)
WBC: 7.8 10*3/uL (ref 4.0–10.5)

## 2018-03-22 LAB — PROTIME-INR
INR: 1.51
Prothrombin Time: 18 seconds — ABNORMAL HIGH (ref 11.4–15.2)

## 2018-03-22 LAB — PREPARE RBC (CROSSMATCH)

## 2018-03-22 LAB — ABO/RH: ABO/RH(D): O POS

## 2018-03-22 MED ORDER — SODIUM CHLORIDE 0.9% IV SOLUTION
Freq: Once | INTRAVENOUS | Status: AC
  Administered 2018-03-22: 11:00:00 via INTRAVENOUS

## 2018-03-22 MED ORDER — FUROSEMIDE 10 MG/ML IJ SOLN
20.0000 mg | Freq: Once | INTRAMUSCULAR | Status: AC
  Administered 2018-03-22: 20 mg via INTRAVENOUS
  Filled 2018-03-22: qty 2

## 2018-03-22 NOTE — Progress Notes (Signed)
Physical Therapy Treatment Patient Details Name: Jose Bush MRN: [id number] DOB: [birth year] Today's Date: 03/22/2018    History of Present Illness Pt s/p rt BKA. PMH - HTN, PAD, rt 2nd toe amputation, lupus, GERD, DVT    PT Comments    Patient with progression this visit, recuring less assistance with bed mobility and tranfers. +1 with RW and min A to power up and provide stability for stand pivot transfer to bedside chair. Patient HRmax 125 during visit, reviewed therex.     Follow Up Recommendations  CIR(CIR denied, SNF next best)     Equipment Recommendations  Wheelchair (measurements PT);Wheelchair cushion (measurements PT)    Recommendations for Other Services       Precautions / Restrictions Precautions Precautions: Fall Restrictions Weight Bearing Restrictions: Yes RLE Weight Bearing: Non weight bearing    Mobility  Bed Mobility Overal bed mobility: Needs Assistance Bed Mobility: Sit to Supine     Supine to sit: Min assist Sit to supine: Min assist   General bed mobility comments: Min A for LE assist to return to supine.   Transfers Overall transfer level: Needs assistance Equipment used: Ambulation equipment used Transfers: Sit to/from [employer] Sit to Stand: Min assist;Min guard Stand pivot transfers: Min guard;Min assist       General transfer comment: Patient with progression today, in min A from elevated surface inside RW stand poviot to bedside chair with min A for stability.   Ambulation/Gait                 Stairs             Wheelchair Mobility    Modified Rankin (Stroke Patients Only)       Balance Overall balance assessment: Needs assistance Sitting-balance support: No upper extremity supported;Feet supported Sitting balance-Leahy Scale: Fair Sitting balance - Comments: Static sitting stable but requires min guard to min assist for dynamic activities   Standing balance support: Bilateral  upper extremity supported Standing balance-Leahy Scale: Poor Standing balance comment: Reliant on BUE support and external support for balance.                             Cognition Arousal/Alertness: Awake/alert Behavior During Therapy: WFL for tasks assessed/performed Overall Cognitive Status: Within Functional Limits for tasks assessed                                        Exercises Amputee Exercises Quad Sets: AROM;Right;10 reps;Supine Knee Extension: AROM;Right;10 reps;Seated Straight Leg Raises: AROM;Right;10 reps;Supine    General Comments        Pertinent Vitals/Pain Pain Assessment: Faces Faces Pain Scale: Hurts little more Pain Location: RLE Pain Descriptors / Indicators: Sore Pain Intervention(s): Limited activity within patient's tolerance;Premedicated before session;Monitored during session;Repositioned    Home Living                      Prior Function            PT Goals (current goals can now be found in the care plan section) Acute Rehab PT Goals Patient Stated Goal: go to rehab and then home PT Goal Formulation: With patient Time For Goal Achievement: 04/02/18 Potential to Achieve Goals: Good Progress towards PT goals: Progressing toward goals    Frequency    Min 3X/week  PT Plan Current plan remains appropriate    Co-evaluation              AM-PAC PT "6 Clicks" Daily Activity  Outcome Measure  Difficulty turning over in bed (including adjusting bedclothes, sheets and blankets)?: A Lot Difficulty moving from lying on back to sitting on the side of the bed? : A Lot Difficulty sitting down on and standing up from a chair with arms (e.g., wheelchair, bedside commode, etc,.)?: A Lot Help needed moving to and from a bed to chair (including a wheelchair)?: A Lot Help needed walking in hospital room?: Total Help needed climbing 3-5 steps with a railing? : Total 6 Click Score: 10    End of  Session Equipment Utilized During Treatment: Gait belt Activity Tolerance: Patient tolerated treatment well Patient left: in bed;with call bell/phone within reach;with family/visitor present Nurse Communication: Mobility status;Need for lift equipment PT Visit Diagnosis: Other abnormalities of gait and mobility (R26.89);Difficulty in walking, not elsewhere classified (R26.2) Pain - Right/Left: Right Pain - part of body: Leg     Time: 1700-1717 PT Time Calculation (min) (ACUTE ONLY): 17 min  Charges:  $Therapeutic Activity: 8-22 mins                    G Codes:      Etta GrandchildSean Derisha Funderburke, PT, DPT Acute Rehab Services Pager: [phone]     Etta GrandchildSean Jamilia Jacques 03/22/2018, 5:31 PM

## 2018-03-22 NOTE — Progress Notes (Addendum)
  Progress Note    03/22/2018 7:59 AM 4 Days Post-Op  Subjective:  Says he has been doing the exercises they gave him.  Had to put the foley back in last night.  Tm 99.9 now afebrile HR 60's-80's NSR 120's systolic 98% RA  Vitals:   03/21/18 2153 03/22/18 0547  BP:  120/64  Pulse:  64  Resp:  13  Temp: 99 F (37.2 C) 97.9 F (36.6 C)  SpO2:  97%    Physical Exam: Incisions:  Clean and dry with staples in tact; darkened, dusky area on the lateral aspect of the incision.  Still with edema stump.  Minimal serous drainage on bandage when removed.    CBC    Component Value Date/Time   WBC 7.8 03/22/2018 0321   RBC 2.70 (L) 03/22/2018 0321   HGB 7.7 (L) 03/22/2018 0321   HCT 24.4 (L) 03/22/2018 0321   PLT 218 03/22/2018 0321   MCV 90.4 03/22/2018 0321   MCH 28.5 03/22/2018 0321   MCHC 31.6 03/22/2018 0321   RDW 13.9 03/22/2018 0321   LYMPHSABS 0.9 02/05/2013 0640   MONOABS 0.4 02/05/2013 0640   EOSABS 0.0 02/05/2013 0640   BASOSABS 0.0 02/05/2013 0640    BMET    Component Value Date/Time   NA 137 03/20/2018 0350   K 4.2 03/20/2018 0350   CL 103 03/20/2018 0350   CO2 25 03/20/2018 0350   GLUCOSE 107 (H) 03/20/2018 0350   BUN 17 03/20/2018 0350   CREATININE 0.79 03/20/2018 0350   CALCIUM 8.5 (L) 03/20/2018 0350   GFRNONAA >60 03/20/2018 0350   GFRAA >60 03/20/2018 0350    INR    Component Value Date/Time   INR 1.51 03/22/2018 0321     Intake/Output Summary (Last 24 hours) at 03/22/2018 0759 Last data filed at 03/22/2018 0547 Gross per 24 hour  Intake 478 ml  Output 1275 ml  Net -797 ml   Blood Cx 03/19/18:  No growth x 2 days Blood Cx 03/20/18:  No growth x 1 day Urine Cx 03/19/18:  No growth-Final   Assessment/Plan:  77 y.o. male is s/p right below knee amputation  4 Days Post-Op  -pt in good spirits this morning.  He is continuing his exercises.  -still with low grade fever at night-blood cx no growth to day and urine cx final revealed no  growth.  WBC normal.   -BKA site looks good but still with a darkened, dusky area on the lateral aspect of incision.  Continue to monitor.  Stump still with edema.  Elevate leg as much as possible. -urinary retention again.  Foley had to be placed again.  May need to discharge with foley and f/u with urologist. -acute blood loss anemia:  hgb 7.7 this morning down from 8.4.  Continues to be asymptomatic.  CBC tomorrow.  May need transfusion.  Will d/w Dr. Anne HahnEarly   Samantha Rhyne, PA-C [facility] [phone] 03/22/2018 7:59 AM

## 2018-03-22 NOTE — Progress Notes (Signed)
Patient with complaints of constipation, Miralax given as ordered as needed for constipation along with prune juice per patient request. Will monitor patient. Jolina Symonds, Randall AnKristin Jessup RN

## 2018-03-23 LAB — TYPE AND SCREEN
ABO/RH(D): O POS
ANTIBODY SCREEN: NEGATIVE
UNIT DIVISION: 0

## 2018-03-23 LAB — BPAM RBC
Blood Product Expiration Date: 201907202359
ISSUE DATE / TIME: 201906221041
UNIT TYPE AND RH: 5100

## 2018-03-23 LAB — CBC
HEMATOCRIT: 28.7 % — AB (ref 39.0–52.0)
Hemoglobin: 9 g/dL — ABNORMAL LOW (ref 13.0–17.0)
MCH: 28.2 pg (ref 26.0–34.0)
MCHC: 31.4 g/dL (ref 30.0–36.0)
MCV: 90 fL (ref 78.0–100.0)
PLATELETS: 244 10*3/uL (ref 150–400)
RBC: 3.19 MIL/uL — AB (ref 4.22–5.81)
RDW: 13.7 % (ref 11.5–15.5)
WBC: 6.7 10*3/uL (ref 4.0–10.5)

## 2018-03-23 LAB — PROTIME-INR
INR: 1.88
Prothrombin Time: 21.4 seconds — ABNORMAL HIGH (ref 11.4–15.2)

## 2018-03-23 NOTE — Progress Notes (Signed)
Subjective: Interval History: none.. Comfortable.  Objective: Vital signs in last 24 hours: Temp:  [97.8 F (36.6 C)-99.2 F (37.3 C)] 97.8 F (36.6 C) (06/23 0820) Pulse Rate:  [70-86] 70 (06/23 0407) Resp:  [16-24] 16 (06/23 0407) BP: (121-137)/(57-69) 123/66 (06/23 0820) SpO2:  [98 %-100 %] 100 % (06/23 0407)  Intake/Output from previous day: 06/22 0701 - 06/23 0700 In: 840 [P.O.:480; Blood:360] Out: 2800 [Urine:2800] Intake/Output this shift: Total I/O In: 240 [P.O.:240] Out: -   Right BKA healing.  Very small area at the lateral most suture line with some skin loss.  Lab Results: Recent Labs    03/22/18 0321 03/23/18 0503  WBC 7.8 6.7  HGB 7.7* 9.0*  HCT 24.4* 28.7*  PLT 218 244   BMET No results for input(s): NA, K, CL, CO2, GLUCOSE, BUN, CREATININE, CALCIUM in the last 72 hours.  Studies/Results: Dg Tibia/fibula Right  Result Date: 03/18/2018 CLINICAL DATA:  Gangrene, atherosclerosis. EXAM: RIGHT TIBIA AND FIBULA - 2 VIEW COMPARISON:  None. FINDINGS: There is no evidence of fracture or other focal bone lesions. Status post total knee arthroplasty, intact and well-seated hardware without periprosthetic lucency. Moderate vascular calcifications. No subcutaneous gas or radiopaque foreign bodies. IMPRESSION: No radiographic findings of osteomyelitis. Electronically Signed   By: Awilda Metroourtnay  Bloomer M.D.   On: 03/18/2018 06:11   Dg Chest Port 1 View  Result Date: 03/22/2018 CLINICAL DATA:  Fever. EXAM: PORTABLE CHEST 1 VIEW COMPARISON:  10/19/2015 FINDINGS: The cardiomediastinal silhouette is unchanged and within normal limits. 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 No confluent airspace opacity, edema, sizable pleural effusion, or pneumothorax is identified. IMPRESSION: No active disease. Electronically Signed   By: Sebastian AcheAllen  Grady M.D.   On: 03/22/2018 09:11   Anti-infectives: Anti-infectives (From admission, onward)   Start     Dose/Rate Route  Frequency Ordered Stop   03/18/18 1115  hydroxychloroquine (PLAQUENIL) tablet 200 mg     200 mg Oral 2 times daily 03/18/18 1106     03/18/18 1115  ceFAZolin (ANCEF) IVPB 2g/100 mL premix     2 g 200 mL/hr over 30 Minutes Intravenous Every 8 hours 03/18/18 1106 03/18/18 1913   03/18/18 0546  ceFAZolin (ANCEF) 2-4 GM/100ML-% IVPB    Note to Pharmacy:  Lorette Angosenberger, Meredit: cabinet override      03/18/18 0546 03/18/18 0740   03/18/18 0541  ceFAZolin (ANCEF) IVPB 2g/100 mL premix     2 g 200 mL/hr over 30 Minutes Intravenous 30 min pre-op 03/18/18 0541 03/18/18 0740      Assessment/Plan: s/p Procedure(s): AMPUTATION BELOW KNEE (Right) Stable overall.  Plan for skilled nursing facility tomorrow.  Will continue Foley catheter and plan voiding trial at skilled nursing facility.  Is on Flomax.  Had symptoms suggestive of BPH prior to it   LOS: 5 days   Iyona Pehrson 03/23/2018, 8:34 AM

## 2018-03-24 DIAGNOSIS — R0989 Other specified symptoms and signs involving the circulatory and respiratory systems: Secondary | ICD-10-CM | POA: Diagnosis not present

## 2018-03-24 DIAGNOSIS — I739 Peripheral vascular disease, unspecified: Secondary | ICD-10-CM | POA: Diagnosis not present

## 2018-03-24 DIAGNOSIS — L03115 Cellulitis of right lower limb: Secondary | ICD-10-CM | POA: Diagnosis not present

## 2018-03-24 DIAGNOSIS — Z7901 Long term (current) use of anticoagulants: Secondary | ICD-10-CM | POA: Diagnosis not present

## 2018-03-24 DIAGNOSIS — T8753 Necrosis of amputation stump, right lower extremity: Secondary | ICD-10-CM | POA: Diagnosis not present

## 2018-03-24 DIAGNOSIS — D6869 Other thrombophilia: Secondary | ICD-10-CM | POA: Diagnosis not present

## 2018-03-24 DIAGNOSIS — Z89511 Acquired absence of right leg below knee: Secondary | ICD-10-CM | POA: Diagnosis not present

## 2018-03-24 DIAGNOSIS — N400 Enlarged prostate without lower urinary tract symptoms: Secondary | ICD-10-CM | POA: Diagnosis not present

## 2018-03-24 DIAGNOSIS — R32 Unspecified urinary incontinence: Secondary | ICD-10-CM | POA: Diagnosis not present

## 2018-03-24 DIAGNOSIS — D509 Iron deficiency anemia, unspecified: Secondary | ICD-10-CM | POA: Diagnosis not present

## 2018-03-24 DIAGNOSIS — T8781 Dehiscence of amputation stump: Secondary | ICD-10-CM | POA: Diagnosis not present

## 2018-03-24 DIAGNOSIS — R339 Retention of urine, unspecified: Secondary | ICD-10-CM | POA: Diagnosis not present

## 2018-03-24 DIAGNOSIS — Z7401 Bed confinement status: Secondary | ICD-10-CM | POA: Diagnosis not present

## 2018-03-24 DIAGNOSIS — M2550 Pain in unspecified joint: Secondary | ICD-10-CM | POA: Diagnosis not present

## 2018-03-24 DIAGNOSIS — I10 Essential (primary) hypertension: Secondary | ICD-10-CM | POA: Diagnosis not present

## 2018-03-24 DIAGNOSIS — K219 Gastro-esophageal reflux disease without esophagitis: Secondary | ICD-10-CM | POA: Diagnosis not present

## 2018-03-24 DIAGNOSIS — R2689 Other abnormalities of gait and mobility: Secondary | ICD-10-CM | POA: Diagnosis not present

## 2018-03-24 DIAGNOSIS — T8743 Infection of amputation stump, right lower extremity: Secondary | ICD-10-CM | POA: Diagnosis not present

## 2018-03-24 DIAGNOSIS — M6281 Muscle weakness (generalized): Secondary | ICD-10-CM | POA: Diagnosis not present

## 2018-03-24 DIAGNOSIS — Z4781 Encounter for orthopedic aftercare following surgical amputation: Secondary | ICD-10-CM | POA: Diagnosis not present

## 2018-03-24 DIAGNOSIS — R300 Dysuria: Secondary | ICD-10-CM | POA: Diagnosis not present

## 2018-03-24 LAB — CULTURE, BLOOD (ROUTINE X 2)
Culture: NO GROWTH
SPECIAL REQUESTS: ADEQUATE

## 2018-03-24 LAB — PROTIME-INR
INR: 1.63
Prothrombin Time: 19.2 seconds — ABNORMAL HIGH (ref 11.4–15.2)

## 2018-03-24 MED ORDER — TRAMADOL HCL 50 MG PO TABS: 50.0000 mg | ORAL_TABLET | Freq: Four times a day (QID) | ORAL | 0 refills | Status: DC | PRN

## 2018-03-24 MED ORDER — TAMSULOSIN HCL 0.4 MG PO CAPS: 0.8000 mg | ORAL_CAPSULE | Freq: Every day | ORAL | Status: DC

## 2018-03-24 MED ORDER — TAMSULOSIN HCL 0.4 MG PO CAPS
0.8000 mg | ORAL_CAPSULE | Freq: Every day | ORAL | Status: DC
  Administered 2018-03-24: 0.8 mg via ORAL
  Filled 2018-03-24: qty 2

## 2018-03-24 NOTE — Progress Notes (Signed)
[facility] arranged by CSW. IV and telemetry box removed. Pt to discharge with foley catheter, per MD. Pt and pt's wife aware of plan. [facility]. All questions were answered.    Ardeen JourdainLauren Shylo Dillenbeck BSN, RN

## 2018-03-24 NOTE — Progress Notes (Signed)
Physical Therapy Treatment Patient Details Name: Jose Bush MRN: [id number] DOB: Mar 15, 1941 Today's Date: 03/24/2018    History of Present Illness Pt s/p rt BKA. PMH - HTN, PAD, rt 2nd toe amputation, lupus, GERD, DVT    PT Comments    Pt tolerated well, able to advance to pre-gait training with lifting LLE off ground in place, progressing to forward/retro stepping with LLE, progress to stepping to window/back to bed with RW.  Pt requires min fade to mod assist with fatigue with pre-gait activities.  Transfers to recliner and completes therex as below.      Follow Up Recommendations  SNF;Supervision/Assistance - 24 hour     Equipment Recommendations  None recommended by PT    Recommendations for Other Services       Precautions / Restrictions Precautions Precautions: Fall Restrictions Weight Bearing Restrictions: Yes RLE Weight Bearing: Non weight bearing    Mobility  Bed Mobility Overal bed mobility: Needs Assistance Bed Mobility: Supine to Sit     Supine to sit: Supervision;HOB elevated     General bed mobility comments: multiple attempts to come to full sitting EOB, min cues for bringing LEs off EOB for counterweight  Transfers Overall transfer level: Needs assistance Equipment used: Rolling walker (2 wheeled) Transfers: Sit to/from [employer];Stand Pivot Transfers Sit to Stand: Mod assist Stand pivot transfers: Min assist       General transfer comment: assist to boost from EOB on first 3 attempts, 4th attempt min assist  Ambulation/Gait Ambulation/Gait assistance: Mod assist Gait Distance (Feet): 3 Feet Assistive device: Rolling walker (2 wheeled) Gait Pattern/deviations: Step-to pattern     General Gait Details: steps forward/back x5, and from EOB<>window x2 trials    Stairs             Wheelchair Mobility    Modified Rankin (Stroke Patients Only)       Balance                                             Cognition Arousal/Alertness: Awake/alert Behavior During Therapy: WFL for tasks assessed/performed Overall Cognitive Status: Within Functional Limits for tasks assessed                                        Exercises Amputee Exercises Hip ABduction/ADduction: 15 reps;Strengthening;AROM;Both;Seated Hip Flexion/Marching: AROM;Right;15 reps;Seated Knee Flexion: AROM;Right;15 reps;Seated Knee Extension: AROM;Right;15 reps;Seated    General Comments        Pertinent Vitals/Pain Pain Assessment: 0-10 Pain Score: 4  Pain Location: RLE Pain Descriptors / Indicators: Sore Pain Intervention(s): Limited activity within patient's tolerance;Monitored during session;Patient requesting pain meds-RN notified    Home Living                      Prior Function            PT Goals (current goals can now be found in the care plan section) Acute Rehab PT Goals Patient Stated Goal: go to rehab and then home PT Goal Formulation: With patient Time For Goal Achievement: 04/02/18 Potential to Achieve Goals: Good Progress towards PT goals: Progressing toward goals    Frequency    Min 3X/week      PT Plan Current plan remains appropriate    Co-evaluation  AM-PAC PT "6 Clicks" Daily Activity  Outcome Measure  Difficulty turning over in bed (including adjusting bedclothes, sheets and blankets)?: A Little Difficulty moving from lying on back to sitting on the side of the bed? : A Little Difficulty sitting down on and standing up from a chair with arms (e.g., wheelchair, bedside commode, etc,.)?: A Little Help needed moving to and from a bed to chair (including a wheelchair)?: A Lot Help needed walking in hospital room?: A Lot Help needed climbing 3-5 steps with a railing? : Total 6 Click Score: 14    End of Session Equipment Utilized During Treatment: Gait belt Activity Tolerance: Patient tolerated treatment well Patient left: in  chair;with call bell/phone within reach;with family/visitor present Nurse Communication: Mobility status;Patient requests pain meds PT Visit Diagnosis: Difficulty in walking, not elsewhere classified (R26.2)     Time: 8295-6213 PT Time Calculation (min) (ACUTE ONLY): 25 min  Charges:  $Gait Training: 8-22 mins $Therapeutic Exercise: 8-22 mins                    G Codes:          Stephania Fragmin 03/24/2018, 10:02 AM

## 2018-03-24 NOTE — Progress Notes (Signed)
Pt discharged via PTAR. Pt left with all belongings. Wife present at discharge.    Ardeen JourdainLauren Zebbie Ace BSN, RN

## 2018-03-24 NOTE — Progress Notes (Addendum)
Clinical Social Worker facilitated patient discharge including contacting patient family and facility to confirm patient discharge plans.  Clinical information faxed to facility and family agreeable with plan.  CSW [facility]  RN to call [phone] (pt will go in rm# [id number]) for report prior to discharge.  Clinical Social Worker will sign off for now as social work intervention is no longer needed. Please consult us again if new need arises.  Marrianne MoodAshley Dyani Babel, MSW, [employer]CSWA [phone]

## 2018-03-24 NOTE — Progress Notes (Signed)
  Progress Note    03/24/2018 8:03 AM 6 Days Post-Op  Subjective:  No complaints  Afebrile  Vitals:   03/23/18 1938 03/24/18 0515  BP: (!) 118/54 125/79  Pulse: 72 72  Resp: (!) 21 16  Temp: 98.7 F (37.1 C) 98 F (36.7 C)  SpO2: 95% 95%    Physical Exam: Incisions:          CBC    Component Value Date/Time   WBC 6.7 03/23/2018 0503   RBC 3.19 (L) 03/23/2018 0503   HGB 9.0 (L) 03/23/2018 0503   HCT 28.7 (L) 03/23/2018 0503   PLT 244 03/23/2018 0503   MCV 90.0 03/23/2018 0503   MCH 28.2 03/23/2018 0503   MCHC 31.4 03/23/2018 0503   RDW 13.7 03/23/2018 0503   LYMPHSABS 0.9 02/05/2013 0640   MONOABS 0.4 02/05/2013 0640   EOSABS 0.0 02/05/2013 0640   BASOSABS 0.0 02/05/2013 0640    BMET    Component Value Date/Time   NA 137 03/20/2018 0350   K 4.2 03/20/2018 0350   CL 103 03/20/2018 0350   CO2 25 03/20/2018 0350   GLUCOSE 107 (H) 03/20/2018 0350   BUN 17 03/20/2018 0350   CREATININE 0.79 03/20/2018 0350   CALCIUM 8.5 (L) 03/20/2018 0350   GFRNONAA >60 03/20/2018 0350   GFRAA >60 03/20/2018 0350    INR    Component Value Date/Time   INR 1.63 03/24/2018 0333     Intake/Output Summary (Last 24 hours) at 03/24/2018 0803 Last data filed at 03/24/2018 0515 Gross per 24 hour  Intake 120 ml  Output 1825 ml  Net -1705 ml     Assessment/Plan:  77 y.o. male is s/p right below knee amputation  6 Days Post-Op  -stump with some bloody drainage this morning and edema has improved this morning. -dusky edge of lateral aspect of incision looks ok-should get better over time. -urine and blood cx are all negative -voiding trial at rehab facility -f/u with Dr. Darrick PennaFields in 4 weeks    Doreatha MassedSamantha Mayara Paulson, PA-C [facility] [phone] 03/24/2018 8:03 AM

## 2018-03-25 DIAGNOSIS — R0989 Other specified symptoms and signs involving the circulatory and respiratory systems: Secondary | ICD-10-CM | POA: Diagnosis not present

## 2018-03-25 DIAGNOSIS — T8743 Infection of amputation stump, right lower extremity: Secondary | ICD-10-CM | POA: Diagnosis not present

## 2018-03-25 LAB — CULTURE, BLOOD (ROUTINE X 2)
Culture: NO GROWTH
Special Requests: ADEQUATE

## 2018-03-30 ENCOUNTER — Emergency Department (HOSPITAL_COMMUNITY)
Admission: EM | Admit: 2018-03-30 | Discharge: 2018-03-31 | Disposition: A | Discharge status: Home / Self Care | Payer: Medicare HMO | Source: Home / Self Care | Attending: Emergency Medicine | Admitting: Emergency Medicine

## 2018-03-30 ENCOUNTER — Encounter (HOSPITAL_COMMUNITY): Payer: Self-pay | Admitting: *Deleted

## 2018-03-30 ENCOUNTER — Other Ambulatory Visit: Payer: Self-pay

## 2018-03-30 DIAGNOSIS — Z8673 Personal history of transient ischemic attack (TIA), and cerebral infarction without residual deficits: Secondary | ICD-10-CM

## 2018-03-30 DIAGNOSIS — K219 Gastro-esophageal reflux disease without esophagitis: Secondary | ICD-10-CM | POA: Diagnosis not present

## 2018-03-30 DIAGNOSIS — D509 Iron deficiency anemia, unspecified: Secondary | ICD-10-CM | POA: Diagnosis not present

## 2018-03-30 DIAGNOSIS — Z96641 Presence of right artificial hip joint: Secondary | ICD-10-CM

## 2018-03-30 DIAGNOSIS — M329 Systemic lupus erythematosus, unspecified: Secondary | ICD-10-CM | POA: Diagnosis present

## 2018-03-30 DIAGNOSIS — Z7901 Long term (current) use of anticoagulants: Secondary | ICD-10-CM

## 2018-03-30 DIAGNOSIS — M6281 Muscle weakness (generalized): Secondary | ICD-10-CM | POA: Diagnosis not present

## 2018-03-30 DIAGNOSIS — T8743 Infection of amputation stump, right lower extremity: Principal | ICD-10-CM | POA: Diagnosis present

## 2018-03-30 DIAGNOSIS — R339 Retention of urine, unspecified: Secondary | ICD-10-CM

## 2018-03-30 DIAGNOSIS — Z89512 Acquired absence of left leg below knee: Secondary | ICD-10-CM | POA: Diagnosis not present

## 2018-03-30 DIAGNOSIS — Z96653 Presence of artificial knee joint, bilateral: Secondary | ICD-10-CM | POA: Diagnosis present

## 2018-03-30 DIAGNOSIS — T8781 Dehiscence of amputation stump: Secondary | ICD-10-CM | POA: Diagnosis not present

## 2018-03-30 DIAGNOSIS — T8140XA Infection following a procedure, unspecified, initial encounter: Secondary | ICD-10-CM | POA: Diagnosis not present

## 2018-03-30 DIAGNOSIS — Z7401 Bed confinement status: Secondary | ICD-10-CM | POA: Diagnosis not present

## 2018-03-30 DIAGNOSIS — R338 Other retention of urine: Secondary | ICD-10-CM | POA: Diagnosis present

## 2018-03-30 DIAGNOSIS — I10 Essential (primary) hypertension: Secondary | ICD-10-CM

## 2018-03-30 DIAGNOSIS — Z87891 Personal history of nicotine dependence: Secondary | ICD-10-CM

## 2018-03-30 DIAGNOSIS — D6869 Other thrombophilia: Secondary | ICD-10-CM | POA: Diagnosis not present

## 2018-03-30 DIAGNOSIS — N401 Enlarged prostate with lower urinary tract symptoms: Secondary | ICD-10-CM | POA: Diagnosis present

## 2018-03-30 DIAGNOSIS — T8789 Other complications of amputation stump: Secondary | ICD-10-CM | POA: Diagnosis not present

## 2018-03-30 DIAGNOSIS — R300 Dysuria: Secondary | ICD-10-CM | POA: Diagnosis not present

## 2018-03-30 DIAGNOSIS — I739 Peripheral vascular disease, unspecified: Secondary | ICD-10-CM | POA: Diagnosis not present

## 2018-03-30 DIAGNOSIS — N400 Enlarged prostate without lower urinary tract symptoms: Secondary | ICD-10-CM | POA: Diagnosis not present

## 2018-03-30 DIAGNOSIS — Z79899 Other long term (current) drug therapy: Secondary | ICD-10-CM

## 2018-03-30 DIAGNOSIS — R2689 Other abnormalities of gait and mobility: Secondary | ICD-10-CM | POA: Diagnosis not present

## 2018-03-30 DIAGNOSIS — L03115 Cellulitis of right lower limb: Secondary | ICD-10-CM | POA: Diagnosis present

## 2018-03-30 DIAGNOSIS — M1990 Unspecified osteoarthritis, unspecified site: Secondary | ICD-10-CM | POA: Diagnosis present

## 2018-03-30 DIAGNOSIS — L538 Other specified erythematous conditions: Secondary | ICD-10-CM | POA: Diagnosis not present

## 2018-03-30 DIAGNOSIS — T8753 Necrosis of amputation stump, right lower extremity: Secondary | ICD-10-CM | POA: Diagnosis not present

## 2018-03-30 DIAGNOSIS — S81809A Unspecified open wound, unspecified lower leg, initial encounter: Secondary | ICD-10-CM | POA: Diagnosis not present

## 2018-03-30 DIAGNOSIS — Z89511 Acquired absence of right leg below knee: Secondary | ICD-10-CM | POA: Diagnosis not present

## 2018-03-30 DIAGNOSIS — Z4781 Encounter for orthopedic aftercare following surgical amputation: Secondary | ICD-10-CM | POA: Diagnosis not present

## 2018-03-30 DIAGNOSIS — R32 Unspecified urinary incontinence: Secondary | ICD-10-CM | POA: Diagnosis not present

## 2018-03-30 DIAGNOSIS — R5381 Other malaise: Secondary | ICD-10-CM | POA: Diagnosis not present

## 2018-03-30 LAB — URINALYSIS, ROUTINE W REFLEX MICROSCOPIC
Bilirubin Urine: NEGATIVE
Glucose, UA: NEGATIVE mg/dL
Hgb urine dipstick: NEGATIVE
Ketones, ur: NEGATIVE mg/dL
Leukocytes, UA: NEGATIVE
Nitrite: NEGATIVE
Protein, ur: NEGATIVE mg/dL
Specific Gravity, Urine: 1.012 (ref 1.005–1.030)
pH: 6 (ref 5.0–8.0)

## 2018-03-30 NOTE — ED Provider Notes (Signed)
[facility] EMERGENCY DEPARTMENT Provider Note   CSN: [id number] Arrival date & time: 03/30/18  2220  Time seen 23:31 PM    History   Chief Complaint Chief Complaint  Patient presents with  . Dysuria    HPI Jose Bush is a 77 y.o. male.  HPI patient had a right BKA done on March 18, 2018.  He is currently in a rehab facility in [facility].  Family states while he was in the hospital for a week he had to have a catheter placed twice to drain his bladder.  He had a Foley catheter in place when he went to his rehab facility and they removed it about a week ago.  He states about 3 or 4 days ago he started noticing some difficulty urinating.  He denies abdominal pain today but does complain of some bloating.  He denies nausea, vomiting, but has had diarrhea that he attributes to straining to urinate.  He has had decreased appetite since his surgery.  Prior to his most recent surgery he is never had to have a Foley catheter placed per family.  He does not have a urologist.  PCP Selinda Flavin, MD   Past Medical History:  Diagnosis Date  . Arthritis   . Chest pain, unspecified   . DVT (deep venous thrombosis) (HCC)   . GERD (gastroesophageal reflux disease)   . Heart murmur    hx of   . History of hiatal hernia   . HTN (hypertension)   . Lupus (HCC)   . Lupus (HCC)   . Peripheral vascular disease (HCC)    nonviable tissue   . Shingles    hx of   . Stroke [facility]) 2010   'MILD" per pt-  weakness left side  . Tinnitus   . Ulcer of abdomen wall (HCC)   . Wears dentures   . Wears glasses     Patient Active Problem List   Diagnosis Date Noted  . Non-healing wound of lower extremity 03/18/2018  . Nonhealing surgical wound 03/12/2018  . PAD (peripheral artery disease) (HCC) 03/03/2018  . PVD (peripheral vascular disease) (HCC) 03/03/2018  . Iron deficiency anemia   . Atrophic gastritis without hemorrhage   . OA (osteoarthritis) of hip 05/01/2017  . OA  (osteoarthritis) of knee 12/13/2014  . Other pancytopenia (HCC) 02/04/2013  . Fever 02/02/2013  . Neutropenia (HCC) 02/02/2013  . Weakness 02/01/2013  . Rigors 02/01/2013  . Headache(784.0) 02/01/2013  . Lupus (HCC) 02/01/2013  . Thrombocytopenia (HCC) 02/01/2013  . DVT (deep venous thrombosis) (HCC)   . HYPERTENSION, UNSPECIFIED 06/22/2009  . CHEST PAIN-UNSPECIFIED 06/22/2009    Past Surgical History:  Procedure Laterality Date  . ABDOMINAL AORTOGRAM W/LOWER EXTREMITY N/A 02/17/2018   Procedure: ABDOMINAL AORTOGRAM W/LOWER EXTREMITY;  Surgeon: Maeola Harman, MD;  Location: [facility] INVASIVE CV LAB;  Service: Cardiovascular;  Laterality: N/A;  . AMPUTATION Right 03/03/2018   Procedure: AMPUTATION RIGHT SECOND TOE;  Surgeon: Sherren Kerns, MD;  Location: [facility] OR;  Service: Vascular;  Laterality: Right;  . AMPUTATION Right 03/18/2018   Procedure: AMPUTATION BELOW KNEE;  Surgeon: Sherren Kerns, MD;  Location: [facility] OR;  Service: Vascular;  Laterality: Right;  . BELOW KNEE LEG AMPUTATION Right 03/18/2018  . bilateral cataract surgery     . BIOPSY N/A 10/22/2017   Procedure: BIOPSY;  Surgeon: Franky Macho, MD;  [facility];  Service: Gastroenterology;  Laterality: N/A;  . COLONOSCOPY N/A 09/18/2016   Dr. Lovell Sheehan:  normal  . ESOPHAGOGASTRODUODENOSCOPY N/A 10/22/2017   Dr. Lovell SheehanJenkins: normal duodenum, gastric mucosal atrophy but negative H.pylori (Clotest negative), normal GE junction  . GIVENS CAPSULE STUDY N/A 01/27/2018   Procedure: GIVENS CAPSULE STUDY;  Surgeon: Corbin Adeourk, Robert M, MD;  [facility];  Service: Endoscopy;  Laterality: N/A;  7:00am  . HERNIA REPAIR     2010/laparoscopic/right  . ivc filter     01/2013   . KNEE ARTHROSCOPY Right    2007  . MULTIPLE TOOTH EXTRACTIONS    . PERIPHERAL VASCULAR BALLOON ANGIOPLASTY  02/17/2018   Procedure: PERIPHERAL VASCULAR BALLOON ANGIOPLASTY;  Surgeon: Maeola Harmanain, Brandon Christopher, MD;  [facility];   Service: Cardiovascular;;  . TOTAL HIP ARTHROPLASTY Right 05/01/2017   Procedure: RIGHT TOTAL HIP ARTHROPLASTY ANTERIOR APPROACH;  Surgeon: Ollen GrossAluisio, Frank, MD;  Location: [facility] ORS;  Service: Orthopedics;  Laterality: Right;  . TOTAL KNEE ARTHROPLASTY Left 12/13/2014   Procedure: LEFT TOTAL KNEE ARTHROPLASTY;  Surgeon: Ollen GrossFrank Aluisio, MD;  Location: [facility] ORS;  Service: Orthopedics;  Laterality: Left;  . TOTAL KNEE ARTHROPLASTY Right 03/21/2015   Procedure: RIGHT TOTAL KNEE ARTHROPLASTY;  Surgeon: Ollen GrossFrank Aluisio, MD;  Location: [facility] ORS;  Service: Orthopedics;  Laterality: Right;        Home Medications    Prior to Admission medications   Medication Sig Start Date End Date Taking? Authorizing Provider  acetaminophen (TYLENOL) 500 MG tablet Take 500 mg by mouth every 6 (six) hours as needed for moderate pain or headache.    Yes [provider]  Bioflavonoid Products (ESTER C PO) Take 500 mg by mouth daily.   Yes [provider]  docusate sodium (COLACE) 100 MG capsule Take 100 mg by mouth daily.    Yes [provider]  hydroxychloroquine (PLAQUENIL) 200 MG tablet Take 200 mg by mouth 2 (two) times daily.   Yes [provider]  lisinopril-hydrochlorothiazide (PRINZIDE,ZESTORETIC) 10-12.5 MG per tablet Take 1 tablet by mouth daily with breakfast.    Yes [provider]  Menthol, Topical Analgesic, (ICY HOT EX) Apply 1 application topically 2 (two) times daily as needed (left knee.).    Yes [provider]  Misc Natural Products (URINOZINC PO) Take 1 capsule by mouth 2 (two) times daily.    Yes [provider]  Multiple Vitamin (MULTIVITAMIN WITH MINERALS) TABS tablet Take 1 tablet by mouth daily. Centrum Silver   Yes [provider]  OMEGA-3 KRILL OIL PO Take 1 capsule by mouth daily.   Yes [provider]  omeprazole (PRILOSEC) 20 MG capsule Take 20 mg by mouth daily. 11/06/17  Yes [provider]  tamsulosin (FLOMAX)  0.4 MG CAPS capsule Take 2 capsules (0.8 mg total) by mouth daily. 03/24/18  Yes Rhyne, Ames CoupeSamantha J, PA-C  traMADol (ULTRAM) 50 MG tablet Take 1 tablet (50 mg total) by mouth every 6 (six) hours as needed for severe pain. 03/24/18  Yes Rhyne, Ames CoupeSamantha J, PA-C  warfarin (COUMADIN) 5 MG tablet Take 1 tablet (5 mg total) by mouth every morning. Take Coumadin for three weeks for postoperative protocol and then the patient may resume their previous Coumadin home regimen.  The dose may need to be adjusted based upon the INR.  Please follow the INR and titrate Coumadin dose for a therapeutic range between 2.0 and 3.0 INR.  After completing the three weeks of Coumadin, the patient may resume their previous Coumadin home regimen. Patient taking differently: Take 5 mg by mouth daily.  03/22/15  Yes Perkins, Alexzandrew L, PA-C  nitroGLYCERIN (NITROSTAT) 0.4 MG SL tablet Place 0.4 mg under the tongue every 5 (five) minutes as needed for chest pain. x3 doses as needed for chest pain    [provider]  oxyCODONE (OXY IR/ROXICODONE) 5 MG immediate release tablet Take 1 tablet (5 mg total) by mouth every 6 (six) hours as needed. Patient not taking: Reported on 03/14/2018 03/12/18   Emilie Rutter, PA-C    Family History Family History  Problem Relation Age of Onset  . Hypertension Other   . Stroke Mother   . Colon cancer Neg Hx     Social History Social History   Tobacco Use  . Smoking status: Former Smoker    Packs/day: 0.50    Years: 40.00    Pack years: 20.00    Types: Cigarettes    Last attempt to quit: 10/02/1983    Years since quitting: 34.5  . Smokeless tobacco: Never Used  Substance Use Topics  . Alcohol use: No  . Drug use: No  married, was living at home until his most recent surgery   Allergies   Patient has no known allergies.   Review of Systems Review of Systems  All other systems reviewed and are negative.    Physical Exam Updated Vital Signs BP 131/75 (BP Location:  Left Arm)   Pulse 67   Temp 97.6 F (36.4 C) (Oral)   Resp 16   Ht 5\' 8"  (1.727 m)   Wt 93.9 kg (207 lb)   SpO2 99%   BMI 31.47 kg/m   Vital signs normal    Physical Exam  Constitutional: He is oriented to person, place, and time. He appears well-developed and well-nourished.  HENT:  Head: Normocephalic and atraumatic.  Right Ear: External ear normal.  Left Ear: External ear normal.  Eyes: Conjunctivae and EOM are normal.  Cardiovascular: Normal rate.  Pulmonary/Chest: Effort normal. No respiratory distress.  Abdominal: Soft. He exhibits no distension.  Patient was examined after his Foley catheter was placed.  He is noted to have 1300 cc in his catheter bag.  Neurological: He is alert and oriented to person, place, and time. No cranial nerve deficit.  Skin: Skin is warm and dry. There is pallor.  Psychiatric: He has a normal mood and affect. His behavior is normal.  Nursing note and vitals reviewed.    ED Treatments / Results  Labs (all labs ordered are listed, but only abnormal results are displayed) Results for orders placed or performed during the hospital encounter of 03/30/18  Urinalysis, Routine w reflex microscopic  Result Value Ref Range   Color, Urine YELLOW YELLOW   APPearance CLEAR CLEAR   Specific Gravity, Urine 1.012 1.005 - 1.030   pH 6.0 5.0 - 8.0   Glucose, UA NEGATIVE NEGATIVE mg/dL   Hgb urine dipstick NEGATIVE NEGATIVE   Bilirubin Urine NEGATIVE NEGATIVE   Ketones, ur NEGATIVE NEGATIVE mg/dL   Protein, ur NEGATIVE NEGATIVE mg/dL   Nitrite NEGATIVE NEGATIVE   Leukocytes, UA NEGATIVE NEGATIVE    Laboratory interpretation all normal with normal specific gravity (not dehydrated), also no ketones   EKG None  Radiology No results found.  Procedures Procedures (including critical care time)  Medications Ordered in ED Medications - No data to display   Initial Impression / Assessment and Plan / ED Course  I have reviewed the triage vital  signs and the nursing notes.  Pertinent labs & imaging results that were available during my care of  the patient were reviewed by me and considered in my medical decision making (see chart for details).     Nurse reports his initial bladder scan showed over 800 cc in his bladder.  Foley catheter was placed and he had 1300 cc of urinary output with relief of his abdominal distention.  His urinalysis did not show evidence of UTI.  We discussed he needs to leave the catheter in for at least a week, he should then follow-up with a urologist.  Patient is in a rehab facility in [facility].  I gave them the number for [facility] in [place] or they can have the rehab facility arrange for him to see one in the [place].   Final Clinical Impressions(s) / ED Diagnoses   Final diagnoses:  Acute urinary retention    ED Discharge Orders    None      Plan discharge  Devoria Albe, MD, Concha Pyo, MD 03/31/18 [phone]

## 2018-03-30 NOTE — Discharge Instructions (Addendum)
Please arrange to see a urologist in about a week. You can be seen by [facility] in [place] or your rehab facility can arrange for you to see one in [place]. Recheck if the catheter stops draining urine, you get abdominal pain or have vomiting.

## 2018-03-30 NOTE — ED Triage Notes (Signed)
Pt brought in by [facility] for c/o difficulty urinating and burning with urination x 3-4 days;

## 2018-03-31 DIAGNOSIS — Z7901 Long term (current) use of anticoagulants: Secondary | ICD-10-CM | POA: Diagnosis not present

## 2018-03-31 DIAGNOSIS — M6281 Muscle weakness (generalized): Secondary | ICD-10-CM | POA: Diagnosis not present

## 2018-03-31 DIAGNOSIS — M329 Systemic lupus erythematosus, unspecified: Secondary | ICD-10-CM | POA: Diagnosis present

## 2018-03-31 DIAGNOSIS — T8140XA Infection following a procedure, unspecified, initial encounter: Secondary | ICD-10-CM | POA: Diagnosis not present

## 2018-03-31 DIAGNOSIS — Z7401 Bed confinement status: Secondary | ICD-10-CM | POA: Diagnosis not present

## 2018-03-31 DIAGNOSIS — T8753 Necrosis of amputation stump, right lower extremity: Secondary | ICD-10-CM | POA: Diagnosis not present

## 2018-03-31 DIAGNOSIS — N400 Enlarged prostate without lower urinary tract symptoms: Secondary | ICD-10-CM | POA: Diagnosis not present

## 2018-03-31 DIAGNOSIS — T8781 Dehiscence of amputation stump: Secondary | ICD-10-CM | POA: Diagnosis not present

## 2018-03-31 DIAGNOSIS — Z87891 Personal history of nicotine dependence: Secondary | ICD-10-CM | POA: Diagnosis not present

## 2018-03-31 DIAGNOSIS — I739 Peripheral vascular disease, unspecified: Secondary | ICD-10-CM | POA: Diagnosis not present

## 2018-03-31 DIAGNOSIS — D6869 Other thrombophilia: Secondary | ICD-10-CM | POA: Diagnosis not present

## 2018-03-31 DIAGNOSIS — M1990 Unspecified osteoarthritis, unspecified site: Secondary | ICD-10-CM | POA: Diagnosis present

## 2018-03-31 DIAGNOSIS — R338 Other retention of urine: Secondary | ICD-10-CM | POA: Diagnosis present

## 2018-03-31 DIAGNOSIS — Z96641 Presence of right artificial hip joint: Secondary | ICD-10-CM | POA: Diagnosis present

## 2018-03-31 DIAGNOSIS — Z96653 Presence of artificial knee joint, bilateral: Secondary | ICD-10-CM | POA: Diagnosis present

## 2018-03-31 DIAGNOSIS — R2689 Other abnormalities of gait and mobility: Secondary | ICD-10-CM | POA: Diagnosis not present

## 2018-03-31 DIAGNOSIS — K219 Gastro-esophageal reflux disease without esophagitis: Secondary | ICD-10-CM | POA: Diagnosis not present

## 2018-03-31 DIAGNOSIS — L03115 Cellulitis of right lower limb: Secondary | ICD-10-CM | POA: Diagnosis not present

## 2018-03-31 DIAGNOSIS — T8743 Infection of amputation stump, right lower extremity: Secondary | ICD-10-CM | POA: Diagnosis not present

## 2018-03-31 DIAGNOSIS — I10 Essential (primary) hypertension: Secondary | ICD-10-CM | POA: Diagnosis not present

## 2018-03-31 DIAGNOSIS — L538 Other specified erythematous conditions: Secondary | ICD-10-CM | POA: Diagnosis present

## 2018-03-31 DIAGNOSIS — Z89511 Acquired absence of right leg below knee: Secondary | ICD-10-CM | POA: Diagnosis not present

## 2018-03-31 DIAGNOSIS — Z79899 Other long term (current) drug therapy: Secondary | ICD-10-CM | POA: Diagnosis not present

## 2018-03-31 DIAGNOSIS — Z8673 Personal history of transient ischemic attack (TIA), and cerebral infarction without residual deficits: Secondary | ICD-10-CM | POA: Diagnosis not present

## 2018-03-31 DIAGNOSIS — R5381 Other malaise: Secondary | ICD-10-CM | POA: Diagnosis not present

## 2018-03-31 DIAGNOSIS — T8789 Other complications of amputation stump: Secondary | ICD-10-CM | POA: Diagnosis not present

## 2018-03-31 DIAGNOSIS — Z4781 Encounter for orthopedic aftercare following surgical amputation: Secondary | ICD-10-CM | POA: Diagnosis not present

## 2018-03-31 DIAGNOSIS — N401 Enlarged prostate with lower urinary tract symptoms: Secondary | ICD-10-CM | POA: Diagnosis present

## 2018-03-31 DIAGNOSIS — Z89512 Acquired absence of left leg below knee: Secondary | ICD-10-CM | POA: Diagnosis not present

## 2018-03-31 DIAGNOSIS — D509 Iron deficiency anemia, unspecified: Secondary | ICD-10-CM | POA: Diagnosis not present

## 2018-03-31 NOTE — ED Notes (Signed)
Pt taken back to [facility] by [facility]; family member given discharge papers

## 2018-03-31 NOTE — ED Notes (Signed)
Pt's family members are wanting pt to have more blood work due to him shaking and having diarrhea; Dr. Lynelle DoctorKnapp informed and states she will be in to see pt as soon as she is available. Family informed Dr. Lynelle DoctorKnapp will be in when she is available

## 2018-03-31 NOTE — ED Notes (Signed)
[facility]

## 2018-04-02 ENCOUNTER — Other Ambulatory Visit: Payer: Self-pay

## 2018-04-02 ENCOUNTER — Emergency Department (HOSPITAL_COMMUNITY): Payer: Medicare HMO

## 2018-04-02 ENCOUNTER — Inpatient Hospital Stay (HOSPITAL_COMMUNITY)
Admission: EM | Admit: 2018-04-02 | Discharge: 2018-04-11 | DRG: 565 | Disposition: A | Discharge status: Home Health | Payer: Medicare HMO | Attending: Vascular Surgery | Admitting: Vascular Surgery

## 2018-04-02 ENCOUNTER — Encounter (HOSPITAL_COMMUNITY): Payer: Self-pay | Admitting: *Deleted

## 2018-04-02 DIAGNOSIS — T8753 Necrosis of amputation stump, right lower extremity: Secondary | ICD-10-CM | POA: Diagnosis present

## 2018-04-02 DIAGNOSIS — Z7901 Long term (current) use of anticoagulants: Secondary | ICD-10-CM | POA: Diagnosis not present

## 2018-04-02 DIAGNOSIS — I739 Peripheral vascular disease, unspecified: Secondary | ICD-10-CM | POA: Diagnosis present

## 2018-04-02 DIAGNOSIS — M1990 Unspecified osteoarthritis, unspecified site: Secondary | ICD-10-CM | POA: Diagnosis present

## 2018-04-02 DIAGNOSIS — T8743 Infection of amputation stump, right lower extremity: Secondary | ICD-10-CM | POA: Diagnosis present

## 2018-04-02 DIAGNOSIS — K219 Gastro-esophageal reflux disease without esophagitis: Secondary | ICD-10-CM | POA: Diagnosis present

## 2018-04-02 DIAGNOSIS — Z96641 Presence of right artificial hip joint: Secondary | ICD-10-CM | POA: Diagnosis present

## 2018-04-02 DIAGNOSIS — N401 Enlarged prostate with lower urinary tract symptoms: Secondary | ICD-10-CM | POA: Diagnosis present

## 2018-04-02 DIAGNOSIS — Z96653 Presence of artificial knee joint, bilateral: Secondary | ICD-10-CM | POA: Diagnosis present

## 2018-04-02 DIAGNOSIS — Z8673 Personal history of transient ischemic attack (TIA), and cerebral infarction without residual deficits: Secondary | ICD-10-CM | POA: Diagnosis not present

## 2018-04-02 DIAGNOSIS — L03115 Cellulitis of right lower limb: Secondary | ICD-10-CM | POA: Diagnosis present

## 2018-04-02 DIAGNOSIS — Z79899 Other long term (current) drug therapy: Secondary | ICD-10-CM | POA: Diagnosis not present

## 2018-04-02 DIAGNOSIS — D6869 Other thrombophilia: Secondary | ICD-10-CM | POA: Diagnosis present

## 2018-04-02 DIAGNOSIS — T8149XA Infection following a procedure, other surgical site, initial encounter: Secondary | ICD-10-CM

## 2018-04-02 DIAGNOSIS — L538 Other specified erythematous conditions: Secondary | ICD-10-CM | POA: Diagnosis present

## 2018-04-02 DIAGNOSIS — Z87891 Personal history of nicotine dependence: Secondary | ICD-10-CM | POA: Diagnosis not present

## 2018-04-02 DIAGNOSIS — I10 Essential (primary) hypertension: Secondary | ICD-10-CM | POA: Diagnosis present

## 2018-04-02 DIAGNOSIS — T8781 Dehiscence of amputation stump: Secondary | ICD-10-CM | POA: Diagnosis present

## 2018-04-02 DIAGNOSIS — R338 Other retention of urine: Secondary | ICD-10-CM | POA: Diagnosis present

## 2018-04-02 DIAGNOSIS — M329 Systemic lupus erythematosus, unspecified: Secondary | ICD-10-CM | POA: Diagnosis present

## 2018-04-02 LAB — C-REACTIVE PROTEIN: CRP: 2.7 mg/dL — AB (ref <1.0)

## 2018-04-02 LAB — CBC WITH DIFFERENTIAL/PLATELET
ABS IMMATURE GRANULOCYTES: 0.1 10*3/uL (ref 0.0–0.1)
Basophils Absolute: 0 10*3/uL (ref 0.0–0.1)
Basophils Relative: 0 %
EOS ABS: 0.6 10*3/uL (ref 0.0–0.7)
Eosinophils Relative: 6 %
HEMATOCRIT: 33.5 % — AB (ref 39.0–52.0)
Hemoglobin: 10.6 g/dL — ABNORMAL LOW (ref 13.0–17.0)
IMMATURE GRANULOCYTES: 1 %
LYMPHS ABS: 1.4 10*3/uL (ref 0.7–4.0)
Lymphocytes Relative: 15 %
MCH: 28.6 pg (ref 26.0–34.0)
MCHC: 31.6 g/dL (ref 30.0–36.0)
MCV: 90.3 fL (ref 78.0–100.0)
MONO ABS: 0.8 10*3/uL (ref 0.1–1.0)
Monocytes Relative: 9 %
NEUTROS ABS: 6.6 10*3/uL (ref 1.7–7.7)
NEUTROS PCT: 69 %
Platelets: 349 10*3/uL (ref 150–400)
RBC: 3.71 MIL/uL — ABNORMAL LOW (ref 4.22–5.81)
RDW: 14.6 % (ref 11.5–15.5)
WBC: 9.5 10*3/uL (ref 4.0–10.5)

## 2018-04-02 LAB — PROTIME-INR
INR: 2.2
Prothrombin Time: 24.2 seconds — ABNORMAL HIGH (ref 11.4–15.2)

## 2018-04-02 LAB — BASIC METABOLIC PANEL
ANION GAP: 10 (ref 5–15)
BUN: 13 mg/dL (ref 8–23)
CO2: 24 mmol/L (ref 22–32)
Calcium: 9.5 mg/dL (ref 8.9–10.3)
Chloride: 103 mmol/L (ref 98–111)
Creatinine, Ser: 0.71 mg/dL (ref 0.61–1.24)
GFR calc Af Amer: >60 mL/min (ref >60)
GFR calc non Af Amer: >60 mL/min (ref >60)
GLUCOSE: 101 mg/dL — AB (ref 70–99)
POTASSIUM: 4.3 mmol/L (ref 3.5–5.1)
Sodium: 137 mmol/L (ref 135–145)

## 2018-04-02 LAB — I-STAT CG4 LACTIC ACID, ED: Lactic Acid, Venous: 2.01 mmol/L (ref 0.5–1.9)

## 2018-04-02 LAB — SEDIMENTATION RATE: SED RATE: 55 mm/h — AB (ref 0–16)

## 2018-04-02 MED ORDER — SODIUM CHLORIDE 0.9% FLUSH
3.0000 mL | Freq: Two times a day (BID) | INTRAVENOUS | Status: DC
  Administered 2018-04-02 – 2018-04-11 (×14): 3 mL via INTRAVENOUS

## 2018-04-02 MED ORDER — METHOCARBAMOL 500 MG PO TABS
500.0000 mg | ORAL_TABLET | Freq: Four times a day (QID) | ORAL | Status: DC | PRN
  Administered 2018-04-02 – 2018-04-07 (×4): 500 mg via ORAL
  Filled 2018-04-02 (×5): qty 1

## 2018-04-02 MED ORDER — LISINOPRIL-HYDROCHLOROTHIAZIDE 10-12.5 MG PO TABS: 1.0000 | ORAL_TABLET | Freq: Every day | ORAL | Status: DC

## 2018-04-02 MED ORDER — PHENOL 1.4 % MT LIQD: 1.0000 | OROMUCOSAL | Status: DC | PRN

## 2018-04-02 MED ORDER — HYDROXYCHLOROQUINE SULFATE 200 MG PO TABS
200.0000 mg | ORAL_TABLET | Freq: Two times a day (BID) | ORAL | Status: DC
  Administered 2018-04-02 – 2018-04-11 (×18): 200 mg via ORAL
  Filled 2018-04-02 (×18): qty 1

## 2018-04-02 MED ORDER — VANCOMYCIN HCL 10 G IV SOLR
1500.0000 mg | Freq: Once | INTRAVENOUS | Status: AC
  Administered 2018-04-02: 1500 mg via INTRAVENOUS
  Filled 2018-04-02: qty 1500

## 2018-04-02 MED ORDER — SODIUM CHLORIDE 0.9% FLUSH: 3.0000 mL | INTRAVENOUS | Status: DC | PRN

## 2018-04-02 MED ORDER — PIPERACILLIN-TAZOBACTAM 3.375 G IVPB 30 MIN
3.3750 g | Freq: Once | INTRAVENOUS | Status: AC
  Administered 2018-04-02: 3.375 g via INTRAVENOUS
  Filled 2018-04-02: qty 50

## 2018-04-02 MED ORDER — METOPROLOL TARTRATE 5 MG/5ML IV SOLN
2.0000 mg | INTRAVENOUS | Status: DC | PRN
Start: 2018-04-02 — End: 2018-04-11

## 2018-04-02 MED ORDER — ONDANSETRON HCL 4 MG/2ML IJ SOLN
4.0000 mg | Freq: Four times a day (QID) | INTRAMUSCULAR | Status: DC | PRN
  Administered 2018-04-05: 4 mg via INTRAVENOUS
  Filled 2018-04-02: qty 2

## 2018-04-02 MED ORDER — LABETALOL HCL 5 MG/ML IV SOLN
10.0000 mg | INTRAVENOUS | Status: DC | PRN
  Filled 2018-04-02: qty 4

## 2018-04-02 MED ORDER — POTASSIUM CHLORIDE CRYS ER 20 MEQ PO TBCR
20.0000 meq | EXTENDED_RELEASE_TABLET | Freq: Once | ORAL | Status: AC
  Administered 2018-04-02: 20 meq via ORAL
  Filled 2018-04-02: qty 1

## 2018-04-02 MED ORDER — VANCOMYCIN HCL IN DEXTROSE 750-5 MG/150ML-% IV SOLN
750.0000 mg | Freq: Two times a day (BID) | INTRAVENOUS | Status: DC
  Administered 2018-04-03 – 2018-04-11 (×17): 750 mg via INTRAVENOUS
  Filled 2018-04-02 (×19): qty 150

## 2018-04-02 MED ORDER — ENOXAPARIN SODIUM 40 MG/0.4ML ~~LOC~~ SOLN: 40.0000 mg | SUBCUTANEOUS | Status: DC

## 2018-04-02 MED ORDER — TAMSULOSIN HCL 0.4 MG PO CAPS
0.8000 mg | ORAL_CAPSULE | Freq: Every day | ORAL | Status: DC
  Administered 2018-04-03 – 2018-04-11 (×5): 0.8 mg via ORAL
  Filled 2018-04-02 (×9): qty 2

## 2018-04-02 MED ORDER — DOCUSATE SODIUM 100 MG PO CAPS
100.0000 mg | ORAL_CAPSULE | Freq: Two times a day (BID) | ORAL | Status: DC
  Administered 2018-04-02 – 2018-04-11 (×17): 100 mg via ORAL
  Filled 2018-04-02 (×18): qty 1

## 2018-04-02 MED ORDER — OXYCODONE HCL 5 MG PO TABS
5.0000 mg | ORAL_TABLET | ORAL | Status: DC | PRN
  Filled 2018-04-02: qty 1

## 2018-04-02 MED ORDER — SODIUM CHLORIDE 0.9 % IV SOLN
250.0000 mL | INTRAVENOUS | Status: DC | PRN
  Administered 2018-04-03: 250 mL via INTRAVENOUS

## 2018-04-02 MED ORDER — PANTOPRAZOLE SODIUM 40 MG PO TBEC
40.0000 mg | DELAYED_RELEASE_TABLET | Freq: Every day | ORAL | Status: DC
Start: 2018-04-02 — End: 2018-04-11
  Administered 2018-04-03 – 2018-04-11 (×9): 40 mg via ORAL
  Filled 2018-04-02 (×9): qty 1

## 2018-04-02 MED ORDER — ALUM & MAG HYDROXIDE-SIMETH 200-200-20 MG/5ML PO SUSP: 15.0000 mL | ORAL | Status: DC | PRN

## 2018-04-02 MED ORDER — ACETAMINOPHEN 325 MG RE SUPP
325.0000 mg | RECTAL | Status: DC | PRN
  Filled 2018-04-02: qty 2

## 2018-04-02 MED ORDER — HYDRALAZINE HCL 20 MG/ML IJ SOLN
5.0000 mg | INTRAMUSCULAR | Status: DC | PRN
Start: 2018-04-02 — End: 2018-04-11

## 2018-04-02 MED ORDER — MORPHINE SULFATE (PF) 4 MG/ML IV SOLN: 2.0000 mg | INTRAVENOUS | Status: DC | PRN

## 2018-04-02 MED ORDER — LISINOPRIL 10 MG PO TABS
10.0000 mg | ORAL_TABLET | Freq: Every day | ORAL | Status: DC
  Administered 2018-04-03 – 2018-04-11 (×9): 10 mg via ORAL
  Filled 2018-04-02 (×9): qty 1

## 2018-04-02 MED ORDER — HYDROCHLOROTHIAZIDE 12.5 MG PO CAPS
12.5000 mg | ORAL_CAPSULE | Freq: Every day | ORAL | Status: DC
  Administered 2018-04-03 – 2018-04-11 (×9): 12.5 mg via ORAL
  Filled 2018-04-02 (×9): qty 1

## 2018-04-02 MED ORDER — TRAMADOL HCL 50 MG PO TABS
50.0000 mg | ORAL_TABLET | Freq: Four times a day (QID) | ORAL | Status: DC | PRN
  Administered 2018-04-02 – 2018-04-10 (×11): 50 mg via ORAL
  Filled 2018-04-02 (×11): qty 1

## 2018-04-02 MED ORDER — GUAIFENESIN-DM 100-10 MG/5ML PO SYRP
15.0000 mL | ORAL_SOLUTION | ORAL | Status: DC | PRN
  Filled 2018-04-02: qty 15

## 2018-04-02 MED ORDER — ACETAMINOPHEN 325 MG PO TABS
325.0000 mg | ORAL_TABLET | ORAL | Status: DC | PRN
  Administered 2018-04-03 – 2018-04-11 (×18): 650 mg via ORAL
  Filled 2018-04-02 (×18): qty 2

## 2018-04-02 NOTE — ED Provider Notes (Signed)
[facility] EMERGENCY DEPARTMENT Provider Note   CSN: [id number] Arrival date & time: 04/02/18  1452     History   Chief Complaint Chief Complaint  Patient presents with  . Post-op Problem    HPI Jose Bush is a 77 y.o. male.  HPI: Pt has a medical history of PVD with a non-healing ulcer and R BKA 03/18/18, HTN, DVT, CVA, and lupus (on Plaquenil) who presents to the ED with increasing redness, drainage, and pain at his right BKA site. He reports that up until now he has had no post-op complications. He recently began care at a rehab facility and thinks his wound isn't being wrapped as instructed. Over the last 2-3 days, the wound has had increased drainage of a red, yellow fluid, and in the past day the wound has increased in redness. The redness previously was below the suture/staple line, but now it has risen above the suture line. He reports a stinging pain in the extremity. He denies any fevers, chills, dyspnea, or chest pain.   Past Medical History:  Diagnosis Date  . Arthritis   . Chest pain, unspecified   . DVT (deep venous thrombosis) ([place])   . GERD (gastroesophageal reflux disease)   . Heart murmur    hx of   . History of hiatal hernia   . HTN (hypertension)   . Lupus ([place])   . Lupus ([place])   . Peripheral vascular disease (HCC)    nonviable tissue   . Shingles    hx of   . Stroke [facility]) 2010   'MILD" per pt-  weakness left side  . Tinnitus   . Ulcer of abdomen wall ([place])   . Wears dentures   . Wears glasses     Patient Active Problem List   Diagnosis Date Noted  . Non-healing wound of lower extremity 03/18/2018  . Nonhealing surgical wound 03/12/2018  . PAD (peripheral artery disease) ([place]) 03/03/2018  . PVD (peripheral vascular disease) ([place]) 03/03/2018  . Iron deficiency anemia   . Atrophic gastritis without hemorrhage   . OA (osteoarthritis) of hip 05/01/2017  . OA (osteoarthritis) of knee 12/13/2014  . Other pancytopenia ([place])  02/04/2013  . Fever 02/02/2013  . Neutropenia ([place]) 02/02/2013  . Weakness 02/01/2013  . Rigors 02/01/2013  . Headache(784.0) 02/01/2013  . Lupus ([place]) 02/01/2013  . Thrombocytopenia ([place]) 02/01/2013  . DVT (deep venous thrombosis) ([place])   . HYPERTENSION, UNSPECIFIED 06/22/2009  . CHEST PAIN-UNSPECIFIED 06/22/2009    Past Surgical History:  Procedure Laterality Date  . ABDOMINAL AORTOGRAM W/LOWER EXTREMITY N/A 02/17/2018   Procedure: ABDOMINAL AORTOGRAM W/LOWER EXTREMITY;  Surgeon: [name], MD;  Location: [place] CV LAB;  Service: Cardiovascular;  Laterality: N/A;  . AMPUTATION Right 03/03/2018   Procedure: AMPUTATION RIGHT SECOND TOE;  Surgeon: [name], MD;  Location: [place];  Service: Vascular;  Laterality: Right;  . AMPUTATION Right 03/18/2018   Procedure: AMPUTATION BELOW KNEE;  Surgeon: [name], MD;  Location: [place];  Service: Vascular;  Laterality: Right;  . BELOW KNEE LEG AMPUTATION Right 03/18/2018  . bilateral cataract surgery     . BIOPSY N/A 10/22/2017   Procedure: BIOPSY;  Surgeon: [name], MD;  [facility];  Service: Gastroenterology;  Laterality: N/A;  . COLONOSCOPY N/A 09/18/2016   Dr. [name]: normal  . ESOPHAGOGASTRODUODENOSCOPY N/A 10/22/2017   Dr. [name]: normal duodenum, gastric mucosal atrophy but negative H.pylori (Clotest negative), normal GE junction  .  GIVENS CAPSULE STUDY N/A 01/27/2018   Procedure: GIVENS CAPSULE STUDY;  Surgeon: [name], MD;  [facility];  Service: Endoscopy;  Laterality: N/A;  7:00am  . HERNIA REPAIR     2010/laparoscopic/right  . ivc filter     01/2013   . KNEE ARTHROSCOPY Right    2007  . MULTIPLE TOOTH EXTRACTIONS    . PERIPHERAL VASCULAR BALLOON ANGIOPLASTY  02/17/2018   Procedure: PERIPHERAL VASCULAR BALLOON ANGIOPLASTY;  Surgeon: [name], MD;  Location: [place] CV LAB;  Service: Cardiovascular;;  . TOTAL HIP ARTHROPLASTY Right  05/01/2017   Procedure: RIGHT TOTAL HIP ARTHROPLASTY ANTERIOR APPROACH;  Surgeon: [name], MD;  [facility];  Service: Orthopedics;  Laterality: Right;  . TOTAL KNEE ARTHROPLASTY Left 12/13/2014   Procedure: LEFT TOTAL KNEE ARTHROPLASTY;  Surgeon: [name], MD;  Location: [facility];  Service: Orthopedics;  Laterality: Left;  . TOTAL KNEE ARTHROPLASTY Right 03/21/2015   Procedure: RIGHT TOTAL KNEE ARTHROPLASTY;  Surgeon: [name], MD;  [facility];  Service: Orthopedics;  Laterality: Right;        Home Medications    Prior to Admission medications   Medication Sig Start Date End Date Taking? Authorizing Provider  acetaminophen (TYLENOL) 500 MG tablet Take 500 mg by mouth every 6 (six) hours as needed for moderate pain or headache.     [provider]  Bioflavonoid Products (ESTER C PO) Take 500 mg by mouth daily.    [provider]  docusate sodium (COLACE) 100 MG capsule Take 100 mg by mouth daily.     [provider]  hydroxychloroquine (PLAQUENIL) 200 MG tablet Take 200 mg by mouth 2 (two) times daily.    [provider]  lisinopril-hydrochlorothiazide (PRINZIDE,ZESTORETIC) 10-12.5 MG per tablet Take 1 tablet by mouth daily with breakfast.     [provider]  Menthol, Topical Analgesic, (ICY HOT EX) Apply 1 application topically 2 (two) times daily as needed (left knee.).     [provider]  Misc Natural Products (URINOZINC PO) Take 1 capsule by mouth 2 (two) times daily.     [provider]  Multiple Vitamin (MULTIVITAMIN WITH MINERALS) TABS tablet Take 1 tablet by mouth daily. Centrum Silver    [provider]  nitroGLYCERIN (NITROSTAT) 0.4 MG SL tablet Place 0.4 mg under the tongue every 5 (five) minutes as needed for chest pain. x3 doses as needed for chest pain    [provider]  OMEGA-3 KRILL OIL PO Take 1 capsule by mouth daily.    [provider]  omeprazole (PRILOSEC)  20 MG capsule Take 20 mg by mouth daily. 11/06/17   [provider]  oxyCODONE (OXY IR/ROXICODONE) 5 MG immediate release tablet Take 1 tablet (5 mg total) by mouth every 6 (six) hours as needed. Patient not taking: Reported on 03/14/2018 03/12/18   Dagoberto Ligas, PA-C  tamsulosin (FLOMAX) 0.4 MG CAPS capsule Take 2 capsules (0.8 mg total) by mouth daily. 03/24/18   Rhyne, Hulen Shouts, PA-C  traMADol (ULTRAM) 50 MG tablet Take 1 tablet (50 mg total) by mouth every 6 (six) hours as needed for severe pain. 03/24/18   Rhyne, Hulen Shouts, PA-C  warfarin (COUMADIN) 5 MG tablet Take 1 tablet (5 mg total) by mouth every morning. Take Coumadin for three weeks for postoperative protocol and then the patient may resume their previous Coumadin home regimen.  The dose may need to be adjusted based upon the INR.  Please  follow the INR and titrate Coumadin dose for a therapeutic range between 2.0 and 3.0 INR.  After completing the three weeks of Coumadin, the patient may resume their previous Coumadin home regimen. Patient taking differently: Take 5 mg by mouth daily.  03/22/15   Perkins, Alexzandrew L, PA-C    Family History Family History  Problem Relation Age of Onset  . Hypertension Other   . Stroke Mother   . Colon cancer Neg Hx     Social History Social History   Tobacco Use  . Smoking status: Former Smoker    Packs/day: 0.50    Years: 40.00    Pack years: 20.00    Types: Cigarettes    Last attempt to quit: 10/02/1983    Years since quitting: 34.5  . Smokeless tobacco: Never Used  Substance Use Topics  . Alcohol use: No  . Drug use: No     Allergies   Patient has no known allergies.   Review of Systems Review of Systems  Constitutional: Negative for chills and fever.  Respiratory: Negative for cough and shortness of breath.   Cardiovascular: Negative for chest pain and leg swelling.  Gastrointestinal: Negative for abdominal pain, nausea and vomiting.  Genitourinary:       Foley  placed recently for urinary retention in the setting of BPH  Musculoskeletal: Negative for arthralgias and myalgias.  Skin: Positive for color change and wound.  Neurological: Negative for dizziness and headaches.     Physical Exam Updated Vital Signs BP 138/73 (BP Location: Left Arm)   Pulse 89   Temp 98 F (36.7 C) (Oral)   Resp 20 Comment: Simultaneous filing. User may not have seen previous data.  Wt 78.9 kg (174 lb)   SpO2 100%   BMI 26.46 kg/m   Physical Exam  Constitutional: He is oriented to person, place, and time. He appears well-developed and well-nourished. No distress.  HENT:  Head: Normocephalic and atraumatic.  Cardiovascular: Normal rate.  Murmur: Holosystolic murmur (pt has known history of murmur) Pulmonary/Chest: Effort normal and breath sounds normal. No respiratory distress.  Abdominal: Soft. He exhibits no distension. There is no tenderness.  Genitourinary:  Genitourinary Comments: Foley in place  Neurological: He is alert and oriented to person, place, and time.  Skin: Skin is warm and dry.  Right BKA site is erythematous from the stump up past the suture line. Increased warmth. Wound is intact with mild yellow and serosanguinous fluid drainage. Left lower leg has 2+ pitting edema.   Psychiatric: He has a normal mood and affect. His behavior is normal.     ED Treatments / Results  Labs (all labs ordered are listed, but only abnormal results are displayed) Labs Reviewed  CBC WITH DIFFERENTIAL/PLATELET - Abnormal; Notable for the following components:      Result Value   RBC 3.71 (*)    Hemoglobin 10.6 (*)    HCT 33.5 (*)    All other components within normal limits  BASIC METABOLIC PANEL - Abnormal; Notable for the following components:   Glucose, Bld 101 (*)    All other components within normal limits  PROTIME-INR - Abnormal; Notable for the following components:   Prothrombin Time 24.2 (*)    All other components within normal limits    I-STAT CG4 LACTIC ACID, ED - Abnormal; Notable for the following components:   Lactic Acid, Venous 2.01 (*)    All other components within normal limits  CULTURE, BLOOD (ROUTINE X 2)  CULTURE, BLOOD (  ROUTINE X 2)  C-REACTIVE PROTEIN  SEDIMENTATION RATE    EKG None  Radiology No results found.  Procedures Procedures (including critical care time)  Medications Ordered in ED Medications  piperacillin-tazobactam (ZOSYN) IVPB 3.375 g (has no administration in time range)  vancomycin (VANCOCIN) 1,500 mg in sodium chloride 0.9 % 500 mL IVPB (has no administration in time range)     Initial Impression / Assessment and Plan / ED Course  I have reviewed the triage vital signs and the nursing notes.  Pertinent labs & imaging results that were available during my care of the patient were reviewed by me and considered in my medical decision making (see chart for details).  Mr. Corp is a 77 yo male with a history of PVD with a non-healing ulcer and R BKA 03/18/18, HTN, DVT, CVA, and lupus (on Plaquenil) who presents to the ED with increasing redness, drainage, and pain at his right BKA site for the past 2-3 days. Upon arrival, patient is afebrile and hemodynamically stable. Physical exam is significant for increased warmth, erythema, and mild drainage of the right BKA site. Labs are significant for slightly increased lactate at 2.01, increased CRP to 2.7, increased ESR to 55, and no white count. Blood cultures drawn and pending. Xray of the right lower extremity showed no soft tissue gas. Patient suspected to have a post-op wound infection without signs of systemic infection. Started on Zosyn plus Vanc given recent hospitalization. Vascular surgery consulted and feels admission is appropriate. Admit to vascular surgery.  Final Clinical Impressions(s) / ED Diagnoses   Final diagnoses:  None    ED Discharge Orders    None       Stacie Templin, Andree Elk, MD 04/02/18 1933    Rex Kras, Wenda Overland, MD 04/06/18 2003

## 2018-04-02 NOTE — H&P (Signed)
Patient name: Jose Bush MRN: [id number] DOB: [birth year] Sex: male    HPI: Jose Bush is a 77 y.o. male s/p right BKA 2 weeks ago after a failed toe amputation. He was doing well until about 48 hours ago when he developed redness and swelling and pain in the stump.  He has had some clear drainage.  He denies fever or chills.Marland KitchenMarland KitchenOther medical problems include lupus and arthritis.  He is on coumadin secondary to thrombophilia from his lupus.  Past Medical History:  Diagnosis Date  . Arthritis   . Chest pain, unspecified   . DVT (deep venous thrombosis) (HCC)   . GERD (gastroesophageal reflux disease)   . Heart murmur    hx of   . History of hiatal hernia   . HTN (hypertension)   . Lupus (HCC)   . Lupus (HCC)   . Peripheral vascular disease (HCC)    nonviable tissue   . Shingles    hx of   . Stroke [facility]) 2010   'MILD" per pt-  weakness left side  . Tinnitus   . Ulcer of abdomen wall (HCC)   . Wears dentures   . Wears glasses    Past Surgical History:  Procedure Laterality Date  . ABDOMINAL AORTOGRAM W/LOWER EXTREMITY N/A 02/17/2018   Procedure: ABDOMINAL AORTOGRAM W/LOWER EXTREMITY;  Surgeon: Maeola Harman, MD;  Location: [facility] INVASIVE CV LAB;  Service: Cardiovascular;  Laterality: N/A;  . AMPUTATION Right 03/03/2018   Procedure: AMPUTATION RIGHT SECOND TOE;  Surgeon: Sherren Kerns, MD;  Location: [facility] [place] OR;  Service: Vascular;  Laterality: Right;  . AMPUTATION Right 03/18/2018   Procedure: AMPUTATION BELOW KNEE;  Surgeon: Sherren Kerns, MD;  Location: [facility] OR;  Service: Vascular;  Laterality: Right;  . BELOW KNEE LEG AMPUTATION Right 03/18/2018  . bilateral cataract surgery     . BIOPSY N/A 10/22/2017   Procedure: BIOPSY;  Surgeon: Franky Macho, MD;  Location: [facility] ENDO SUITE;  Service: Gastroenterology;  Laterality: N/A;  . COLONOSCOPY N/A 09/18/2016   Dr. Lovell Sheehan: normal  . ESOPHAGOGASTRODUODENOSCOPY N/A 10/22/2017   Dr. Lovell Sheehan: normal duodenum, gastric  mucosal atrophy but negative H.pylori (Clotest negative), normal GE junction  . GIVENS CAPSULE STUDY N/A 01/27/2018   Procedure: GIVENS CAPSULE STUDY;  Surgeon: Corbin Ade, MD;  Location: [facility] END[facility] SUITE;  Service: Endoscopy;  Laterality: N/A;  7:00am  . HERNIA REPAIR     2010/laparoscopic/right  . ivc filter     01/2013   . KNEE ARTHROSCOPY Right    2007  . MULTIPLE TOOTH EXTRACTIONS    . PERIPHERAL VASCULAR BALLOON ANGIOPLASTY  02/17/2018   Procedure: PERIPHERAL VASCULAR BALLOON ANGIOPLASTY;  Surgeon: Maeola Harman, MD;  Location: [facility] INVASIVE CV LAB;  Service: Cardiovascular;;  . TOTAL HIP ARTHROPLASTY Right 05/01/2017   Procedure: RIGHT TOTAL HIP ARTHROPLASTY ANTERIOR APPROACH;  Surgeon: Ollen Gross, MD;  Location: [facility] ORS;  Service: Orthopedics;  Laterality: Right;  . TOTAL KNEE ARTHROPLASTY Left 12/13/2014   Procedure: LEFT TOTAL KNEE ARTHROPLASTY;  Surgeon: Ollen Gross, MD;  Location: [facility] ORS;  Service: Orthopedics;  Laterality: Left;  . TOTAL KNEE ARTHROPLASTY Right 03/21/2015   Procedure: RIGHT TOTAL KNEE ARTHROPLASTY;  Surgeon: Ollen Gross, MD;  Location: [facility] ORS;  Service: Orthopedics;  Laterality: Right;    Family History  Problem Relation Age of Onset  . Hypertension Other   . Stroke Mother   . Colon cancer Neg Hx     SOCIAL HISTORY: Social History  Socioeconomic History  . Marital status: Married    Spouse name: Not on file  . Number of children: Not on file  . Years of education: Not on file  . Highest education level: Not on file  Occupational History  . Not on file  Social Needs  . Financial resource strain: Not on file  . Food insecurity:    Worry: Not on file    Inability: Not on file  . Transportation needs:    Medical: Not on file    Non-medical: Not on file  Tobacco Use  . Smoking status: Former Smoker    Packs/day: 0.50    Years: 40.00    Pack years: 20.00    Types: Cigarettes    Last attempt to quit: 10/02/1983    Years since  quitting: 34.5  . Smokeless tobacco: Never Used  Substance and Sexual Activity  . Alcohol use: No  . Drug use: No  . Sexual activity: Not on file  Lifestyle  . Physical activity:    Days per week: Not on file    Minutes per session: Not on file  . Stress: Not on file  Relationships  . Social connections:    Talks on phone: Not on file    Gets together: Not on file    Attends religious service: Not on file    Active member of club or organization: Not on file    Attends meetings of clubs or organizations: Not on file    Relationship status: Not on file  . Intimate partner violence:    Fear of current or ex partner: Not on file    Emotionally abused: Not on file    Physically abused: Not on file    Forced sexual activity: Not on file  Other Topics Concern  . Not on file  Social History Narrative  . Not on file    No Known Allergies  Current Facility-Administered Medications  Medication Dose Route Frequency Provider Last Rate Last Dose  . 0.9 %  sodium chloride infusion  250 mL Intravenous PRN Sherren KernsFields, Ernest Popowski E, MD      . acetaminophen (TYLENOL) tablet 325-650 mg  325-650 mg Oral Q4H PRN Sherren KernsFields, Disha Cottam E, MD       Or  . acetaminophen (TYLENOL) suppository 325-650 mg  325-650 mg Rectal Q4H PRN Sherren KernsFields, Sherri Mcarthy E, MD      . alum & mag hydroxide-simeth (MAALOX/MYLANTA) 200-200-20 MG/5ML suspension 15-30 mL  15-30 mL Oral Q2H PRN Sherren KernsFields, Chanese Hartsough E, MD      . docusate sodium (COLACE) capsule 100 mg  100 mg Oral BID Sherren KernsFields, Breuna Loveall E, MD      . enoxaparin (LOVENOX) injection 40 mg  40 mg Subcutaneous Q24H Jaaliyah Lucatero, Janetta Horaharles E, MD      . guaiFENesin-dextromethorphan (ROBITUSSIN DM) 100-10 MG/5ML syrup 15 mL  15 mL Oral Q4H PRN Sherren KernsFields, Sanyia Dini E, MD      . hydrALAZINE (APRESOLINE) injection 5 mg  5 mg Intravenous Q20 Min PRN Sherren KernsFields, Alexandria Shiflett E, MD      . hydroxychloroquine (PLAQUENIL) tablet 200 mg  200 mg Oral BID Sherren KernsFields, Yunuen Mordan E, MD      . labetalol (NORMODYNE,TRANDATE) injection 10  mg  10 mg Intravenous Q10 min PRN Sherren KernsFields, Kyree Adriano E, MD      . Melene Muller[START ON 04/03/2018] lisinopril-hydrochlorothiazide (PRINZIDE,ZESTORETIC) 10-12.5 MG per tablet 1 tablet  1 tablet Oral Q breakfast Artice Bergerson, Janetta Horaharles E, MD      . methocarbamol (ROBAXIN) tablet 500 mg  500  mg Oral Q6H PRN Sherren Kerns, MD      . metoprolol tartrate (LOPRESSOR) injection 2-5 mg  2-5 mg Intravenous Q2H PRN Sherren Kerns, MD      . morphine 4 MG/ML injection 2-5 mg  2-5 mg Intravenous Q1H PRN Sherren Kerns, MD      . ondansetron [facility]) injection 4 mg  4 mg Intravenous Q6H PRN Sherren Kerns, MD      . oxyCODONE (Oxy IR/ROXICODONE) immediate release tablet 5-10 mg  5-10 mg Oral Q4H PRN Sherren Kerns, MD      . pantoprazole (PROTONIX) EC tablet 40 mg  40 mg Oral Daily Chanae Gemma E, MD      . phenol (CHLORASEPTIC) mouth spray 1 spray  1 spray Mouth/Throat PRN Sherren Kerns, MD      . potassium chloride SA (K-DUR,KLOR-CON) CR tablet 20-40 mEq  20-40 mEq Oral Once Sherren Kerns, MD      . sodium chloride flush (NS) 0.9 % injection 3 mL  3 mL Intravenous Q12H Lisandro Meggett E, MD      . sodium chloride flush (NS) 0.9 % injection 3 mL  3 mL Intravenous PRN Sherren Kerns, MD      . tamsulosin [facility]) capsule 0.8 mg  0.8 mg Oral Daily Sherren Kerns, MD      . Melene Muller ON 04/03/2018] vancomycin (VANCOCIN) IVPB 750 mg/150 ml premix  750 mg Intravenous Q12H Armandina Stammer, [facility]       Current Outpatient Medications  Medication Sig Dispense Refill  . acetaminophen (TYLENOL) 500 MG tablet Take 500 mg by mouth every 6 (six) hours as needed for moderate pain or headache.     Marland Kitchen Bioflavonoid Products (ESTER C PO) Take 500 mg by mouth daily.    Marland Kitchen docusate sodium (COLACE) 100 MG capsule Take 100 mg by mouth daily.     . hydroxychloroquine (PLAQUENIL) 200 MG tablet Take 200 mg by mouth 2 (two) times daily.    Marland Kitchen lisinopril-hydrochlorothiazide (PRINZIDE,ZESTORETIC) 10-12.5 MG per tablet Take 1 tablet  by mouth daily with breakfast.     . Menthol, Topical Analgesic, (ICY HOT EX) Apply 1 application topically 2 (two) times daily as needed (left knee.).     Marland Kitchen Misc Natural Products (URINOZINC PO) Take 1 capsule by mouth 2 (two) times daily.     . Multiple Vitamin (MULTIVITAMIN WITH MINERALS) TABS tablet Take 1 tablet by mouth daily. Centrum Silver    . nitroGLYCERIN (NITROSTAT) 0.4 MG SL tablet Place 0.4 mg under the tongue every 5 (five) minutes as needed for chest pain. x3 doses as needed for chest pain    . OMEGA-3 KRILL OIL PO Take 1 capsule by mouth daily.    Marland Kitchen omeprazole (PRILOSEC) 20 MG capsule Take 20 mg by mouth daily.    Marland Kitchen oxyCODONE (OXY IR/ROXICODONE) 5 MG immediate release tablet Take 1 tablet (5 mg total) by mouth every 6 (six) hours as needed. (Patient not taking: Reported on 03/14/2018) 6 tablet 0  . tamsulosin (FLOMAX) 0.4 MG CAPS capsule Take 2 capsules (0.8 mg total) by mouth daily.    . traMADol (ULTRAM) 50 MG tablet Take 1 tablet (50 mg total) by mouth every 6 (six) hours as needed for severe pain. 20 tablet 0  . warfarin (COUMADIN) 5 MG tablet Take 1 tablet (5 mg total) by mouth every morning. Take Coumadin for three weeks for postoperative protocol and then the patient may resume their previous Coumadin  home regimen.  The dose may need to be adjusted based upon the INR.  Please follow the INR and titrate Coumadin dose for a therapeutic range between 2.0 and 3.0 INR.  After completing the three weeks of Coumadin, the patient may resume their previous Coumadin home regimen. (Patient taking differently: Take 5 mg by mouth daily. ) 35 tablet 0    ROS:   General:  No weight loss, Fever, chills  HEENT: No recent headaches, no nasal bleeding, no visual changes, no sore throat  Neurologic: No dizziness, blackouts, seizures. No recent symptoms of stroke or mini- stroke. No recent episodes of slurred speech, or temporary blindness.  Cardiac: No recent episodes of chest pain/pressure,  no shortness of breath at rest.  No shortness of breath with exertion.  Denies history of atrial fibrillation or irregular heartbeat  Vascular: No history of rest pain in feet.  No history of claudication.  No history of non-healing ulcer, No history of DVT   Pulmonary: No home oxygen, no productive cough, no hemoptysis,  No asthma or wheezing  Musculoskeletal:  [X]  Arthritis, [ ]  Low back pain,  [X]  Joint pain  Hematologic:No history of hypercoagulable state.  No history of easy bleeding.  No history of anemia  Gastrointestinal: No hematochezia or melena,  No gastroesophageal reflux, no trouble swallowing  Urinary: [ ]  chronic Kidney disease, [ ]  on HD - [ ]  MWF or [ ]  TTHS, [ ]  Burning with urination, [ ]  Frequent urination, [ ]  Difficulty urinating;   Skin: No rashes  Psychological: No history of anxiety,  No history of depression   Physical Examination  Vitals:   04/02/18 1630 04/02/18 1730 04/02/18 1915 04/02/18 1930  BP: (!) 126/55 (!) 119/59 133/71 (!) 121/102  Pulse: 74 78 84 88  Resp: 17 (!) 22 (!) 24 (!) 21  Temp:      TempSrc:      SpO2: 99% 98% 99% 97%  Weight:        Body mass index is 26.46 kg/m.  General:  Alert and oriented, no acute distress HEENT: Normal Neck: No bruit or JVD Pulmonary: Clear to auscultation bilaterally Cardiac: Regular Rate and Rhythm without murmur Abdomen: Soft, non-tender, non-distended, no mass Skin: No rash, erythema surrounding the entire BKA stump, incision is intact with staples, some serous drainage from the right lateral corner Extremity Pulses:  2+ radial, brachial, femoral, popliteal pulse right side  Musculoskeletal: right BKA with moderate edema  Neurologic: Upper and lower extremity motor 5/5 and symmetric  DATA:  INR 2  CBC    Component Value Date/Time   WBC 9.5 04/02/2018 1533   RBC 3.71 (L) 04/02/2018 1533   HGB 10.6 (L) 04/02/2018 1533   HCT 33.5 (L) 04/02/2018 1533   PLT 349 04/02/2018 1533   MCV 90.3  04/02/2018 1533   MCH 28.6 04/02/2018 1533   MCHC 31.6 04/02/2018 1533   RDW 14.6 04/02/2018 1533   LYMPHSABS 1.4 04/02/2018 1533   MONOABS 0.8 04/02/2018 1533   EOSABS 0.6 04/02/2018 1533   BASOSABS 0.0 04/02/2018 1533    BMET    Component Value Date/Time   NA 137 04/02/2018 1533   K 4.3 04/02/2018 1533   CL 103 04/02/2018 1533   CO2 24 04/02/2018 1533   GLUCOSE 101 (H) 04/02/2018 1533   BUN 13 04/02/2018 1533   CREATININE 0.71 04/02/2018 1533   CALCIUM 9.5 04/02/2018 1533   GFRNONAA >60 04/02/2018 1533   GFRAA >60 04/02/2018 1533  ASSESSMENT:  Cellulitis in recent BKA   PLAN:  1. Admit for IV antibiotics if not improved in the next few days may need I and D or possibly AkA  2. Hold coumadin and transition to heparin in anticipation of operation in the next few days  Start heparin when INR < 1.7   Fabienne Bruns, MD [facility] [place] Office: [phone] Pager: [phone]

## 2018-04-02 NOTE — ED Triage Notes (Signed)
Pt in c/o increased drainage and and redness to right BKA, surgery was 2 weeks ago, sent in by home health RN, surgery completed by Dr. Darrick PennaFields, pt denies fever or increased pain

## 2018-04-02 NOTE — Progress Notes (Signed)
Pharmacy Antibiotic Note  Clearnce [name] is a 77 y.o. male admitted on 04/02/2018 with cellulitis.  Pharmacy has been consulted for vancomycin dosing.  Plan: Give vancomycin 1.5g IV x 1, then start vancomycin 750mg  IV Q12h Monitor clinical picture, renal function, VT prn F/U C&S, abx deescalation / LOT   Weight: 174 lb (78.9 kg)  Temp (24hrs), Avg:98 F (36.7 C), Min:98 F (36.7 C), Max:98 F (36.7 C)  Recent Labs  Lab 04/02/18 1533 04/02/18 1540  WBC 9.5  --   LATICACIDVEN  --  2.01*    Estimated Creatinine Clearance: 74.8 mL/min (by C-G formula based on SCr of 0.79 mg/dL).    No Known Allergies   Thank you for allowing pharmacy to be a part of this patient's care.  Armandina StammerBATCHELDER,Arlean Thies J 04/02/2018 4:15 PM

## 2018-04-02 NOTE — Progress Notes (Addendum)
ANTICOAGULATION CONSULT NOTE - Initial Consult  Pharmacy Consult for heparin Indication: Thrombophilia secondary to lupus  No Known Allergies  Patient Measurements: Weight: 174 lb (78.9 kg) Heparin Dosing Weight: 78.9 kg  Vital Signs: Temp: 98 F (36.7 C) (07/03 1510) Temp Source: Oral (07/03 1510) BP: 121/102 (07/03 1930) Pulse Rate: 88 (07/03 1930)  Labs: Recent Labs    04/02/18 1533 04/02/18 1553  HGB 10.6*  --   HCT 33.5*  --   PLT 349  --   LABPROT  --  24.2*  INR  --  2.20  CREATININE 0.71  --     Estimated Creatinine Clearance: 74.8 mL/min (by C-G formula based on SCr of 0.71 mg/dL).   Medical History: Past Medical History:  Diagnosis Date  . Arthritis   . Chest pain, unspecified   . DVT (deep venous thrombosis) (HCC)   . GERD (gastroesophageal reflux disease)   . Heart murmur    hx of   . History of hiatal hernia   . HTN (hypertension)   . Lupus (HCC)   . Lupus (HCC)   . Peripheral vascular disease (HCC)    nonviable tissue   . Shingles    hx of   . Stroke [facility]) 2010   'MILD" per pt-  weakness left side  . Tinnitus   . Ulcer of abdomen wall (HCC)   . Wears dentures   . Wears glasses     Assessment: 77 yo M presents on 7/3 with redness and swelling of stump. On Coumadin 5mg  PO daily PTA for thrombophilia. Pharmacy consulted to transition to heparin. INR on admit is 2.2.  Goal of Therapy:  Heparin level 0.3-0.7 units/ml Monitor platelets by anticoagulation protocol: Yes   Plan:  Hold Coumadin Monitor daily INR, CBC, s/s of bleed Stop enoxaparin for now with therapeutic INR and plan to transition to heparin Start heparin gtt when INR < 1.7  Charmian Forbis J 04/02/2018,8:40 PM

## 2018-04-02 NOTE — ED Provider Notes (Signed)
Patient placed in Quick Look pathway, seen and evaluated   Chief Complaint: R BKA stump draining  HPI:   Pt is a 77 y.o. male with a PMHx of DVT, HTN, lupus, PVD with nonhealing ulcer s/p R BKA 03/18/18 by Dr. Darrick PennaFields, presenting today with c/o yesterday redness increased, and having drainage for a few days which is increasing. No pain or swelling. Home health nurse was concerned about a "spot" on the side that's an open sore now. No red streaking but some warmth to the touch. No fevers.   ROS: +erythema and drainage to R BKA stump   Physical Exam:  BP 138/73 (BP Location: Left Arm)   Pulse 89   Temp 98 F (36.7 C) (Oral)   Resp 20 Comment: Simultaneous filing. User may not have seen previous data.  Wt 78.9 kg (174 lb)   SpO2 100%   BMI 26.46 kg/m    Gen: No distress  Neuro: Awake and Alert  Skin: R BKA stump with erythema and warmth around the incision, some yellowish drainage to the lateral portion of the incision overlying an area of induration and a small wound at the end of the incision. No definite fluctuance. No red streaking but erythema extends to just below the knee. Mild TTP to lateral edge of the incision. SEE PICTURES         Focused Exam: see skin exam   Initiation of care has begun. The patient has been counseled on the process, plan, and necessity for staying for the completion/evaluation, and the remainder of the medical screening examination     [id number], WoodwardMercedes, [place] JerseyPA-C 04/02/18 1529    Gwyneth SproutPlunkett, [place], MD 04/03/18 [phone]

## 2018-04-02 NOTE — ED Notes (Signed)
Patient transported to X-ray 

## 2018-04-03 LAB — PROTIME-INR
INR: 2.1
PROTHROMBIN TIME: 23.4 s — AB (ref 11.4–15.2)

## 2018-04-03 LAB — CBC
HEMATOCRIT: 32.6 % — AB (ref 39.0–52.0)
HEMOGLOBIN: 10.3 g/dL — AB (ref 13.0–17.0)
MCH: 28.5 pg (ref 26.0–34.0)
MCHC: 31.6 g/dL (ref 30.0–36.0)
MCV: 90.1 fL (ref 78.0–100.0)
Platelets: 309 10*3/uL (ref 150–400)
RBC: 3.62 MIL/uL — AB (ref 4.22–5.81)
RDW: 14.6 % (ref 11.5–15.5)
WBC: 10.3 10*3/uL (ref 4.0–10.5)

## 2018-04-03 MED ORDER — ENSURE ENLIVE PO LIQD
237.0000 mL | Freq: Two times a day (BID) | ORAL | Status: DC
  Administered 2018-04-04: 237 mL via ORAL

## 2018-04-03 NOTE — Progress Notes (Signed)
    Subjective  -more comfortable with right below-knee amputation    Objective Patient Vitals for the past 24 hrs:  BP Temp Temp src Pulse Resp SpO2 Weight  04/03/18 0348 135/64 99.2 F (37.3 C) Oral 92 18 97 % -  04/02/18 2206 133/74 98.9 F (37.2 C) Oral 84 16 100 % -  04/02/18 2200 133/74 98.3 F (36.8 C) - 68 16 100 % -  04/02/18 2102 118/81 98.7 F (37.1 C) Oral - - - -  04/02/18 2100 (!) 146/63 - - 75 19 98 % -  04/02/18 2045 - - - 80 (!) 22 99 % -  04/02/18 2030 - - - 80 (!) 24 100 % -  04/02/18 2015 - - - 87 15 97 % -  04/02/18 1930 (!) 121/102 - - 88 (!) 21 97 % -  04/02/18 1915 133/71 - - 84 (!) 24 99 % -  04/02/18 1730 (!) 119/59 - - 78 (!) 22 98 % -  04/02/18 1630 (!) 126/55 - - 74 17 99 % -  04/02/18 1615 (!) 113/56 - - 79 (!) 22 98 % -  04/02/18 1600 (!) 128/59 - - 79 (!) 24 99 % -  04/02/18 1510 138/73 98 F (36.7 C) Oral 89 20 100 % 174 lb (78.9 kg)    Intake/Output Summary (Last 24 hours) at 04/03/2018 0916 Last data filed at 04/03/2018 [id number] Gross per 24 hour  Intake 862 ml  Output 1200 ml  Net -338 ml    Still with erythema and serous drainage.  No purulence.  Assessment/Planning: Mains afebrile.  INR 2.2.  Continue with antibiotics.  Discussed with patient and wife present potential need for debridement and also potential eventual need for above-knee amputation  Gretta Beganodd Khair Chasteen 04/03/2018 9:16 AM --  Laboratory CBC    Component Value Date/Time   WBC 10.3 04/03/2018 0505   HGB 10.3 (L) 04/03/2018 0505   HCT 32.6 (L) 04/03/2018 0505   PLT 309 04/03/2018 0505    BMET    Component Value Date/Time   NA 137 04/02/2018 1533   K 4.3 04/02/2018 1533   CL 103 04/02/2018 1533   CO2 24 04/02/2018 1533   GLUCOSE 101 (H) 04/02/2018 1533   BUN 13 04/02/2018 1533   CREATININE 0.71 04/02/2018 1533   CALCIUM 9.5 04/02/2018 1533   GFRNONAA >60 04/02/2018 1533   GFRAA >60 04/02/2018 1533    COAG Lab Results  Component Value Date   INR 2.10 04/03/2018     INR 2.20 04/02/2018   INR 1.63 03/24/2018   No results found for: PTT  Antibiotics Anti-infectives (From admission, onward)   Start     Dose/Rate Route Frequency Ordered Stop   04/03/18 0600  vancomycin (VANCOCIN) IVPB 750 mg/150 ml premix     750 mg 150 mL/hr over 60 Minutes Intravenous Every 12 hours 04/02/18 1707     04/02/18 2200  hydroxychloroquine (PLAQUENIL) tablet 200 mg     200 mg Oral 2 times daily 04/02/18 1956     04/02/18 1630  vancomycin (VANCOCIN) 1,500 mg in sodium chloride 0.9 % 500 mL IVPB     1,500 mg 250 mL/hr over 120 Minutes Intravenous  Once 04/02/18 1615 04/02/18 1957   04/02/18 1615  piperacillin-tazobactam (ZOSYN) IVPB 3.375 g     3.375 g 100 mL/hr over 30 Minutes Intravenous  Once 04/02/18 1612 04/02/18 1750

## 2018-04-04 LAB — PROTIME-INR
INR: 1.78
Prothrombin Time: 20.5 seconds — ABNORMAL HIGH (ref 11.4–15.2)

## 2018-04-04 MED ORDER — ENSURE ENLIVE PO LIQD
237.0000 mL | Freq: Two times a day (BID) | ORAL | Status: DC
  Administered 2018-04-07 – 2018-04-09 (×4): 237 mL via ORAL

## 2018-04-04 MED ORDER — BOOST PLUS PO LIQD
237.0000 mL | Freq: Three times a day (TID) | ORAL | Status: DC
  Administered 2018-04-07 – 2018-04-11 (×13): 237 mL via ORAL
  Filled 2018-04-04 (×28): qty 237

## 2018-04-04 MED ORDER — ADULT MULTIVITAMIN W/MINERALS CH
1.0000 | ORAL_TABLET | Freq: Every day | ORAL | Status: DC
  Administered 2018-04-04 – 2018-04-11 (×8): 1 via ORAL
  Filled 2018-04-04 (×8): qty 1

## 2018-04-04 NOTE — Progress Notes (Addendum)
  Progress Note    04/04/2018 9:14 AM  Subjective:  Appetite improving; no pain R BKA stump   Vitals:   04/03/18 2035 04/04/18 0557  BP: 122/64 114/60  Pulse: 89 78  Resp: 17 16  Temp: 98.5 F (36.9 C) 98.3 F (36.8 C)  SpO2: 97% 98%   Physical Exam: Lungs:  Non labored Incisions:  R BKA incision with erythema improving; lateral aspect of incision with necrotic tissue; no purulence or other drainage with manipulation; serous collection on prior dressing Abdomen:  soft Neurologic: A&O  CBC    Component Value Date/Time   WBC 10.3 04/03/2018 0505   RBC 3.62 (L) 04/03/2018 0505   HGB 10.3 (L) 04/03/2018 0505   HCT 32.6 (L) 04/03/2018 0505   PLT 309 04/03/2018 0505   MCV 90.1 04/03/2018 0505   MCH 28.5 04/03/2018 0505   MCHC 31.6 04/03/2018 0505   RDW 14.6 04/03/2018 0505   LYMPHSABS 1.4 04/02/2018 1533   MONOABS 0.8 04/02/2018 1533   EOSABS 0.6 04/02/2018 1533   BASOSABS 0.0 04/02/2018 1533    BMET    Component Value Date/Time   NA 137 04/02/2018 1533   K 4.3 04/02/2018 1533   CL 103 04/02/2018 1533   CO2 24 04/02/2018 1533   GLUCOSE 101 (H) 04/02/2018 1533   BUN 13 04/02/2018 1533   CREATININE 0.71 04/02/2018 1533   CALCIUM 9.5 04/02/2018 1533   GFRNONAA >60 04/02/2018 1533   GFRAA >60 04/02/2018 1533    INR    Component Value Date/Time   INR 1.78 04/04/2018 0419     Intake/Output Summary (Last 24 hours) at 04/04/2018 0914 Last data filed at 04/03/2018 1700 Gross per 24 hour  Intake 450 ml  Output 1100 ml  Net -650 ml     Assessment/Plan:  77 y.o. male is s/p BKA with cellulitis  Erythema improving with IV antibiotics Dressing changed Patient and wife aware of potential for debridement vs AKA Dr. Darrick PennaFields will evaluate later today   Emilie RutterMatthew Eveland, PA-C [facility] [phone] 04/04/2018 9:14 AM  Erythema improved but persistant.  Will have Dr Elita Quickickson reeval over weekend to determine I and D vs AKA vs continue  antibiotics.  Fabienne Brunsharles Kaulder Zahner, MD [facility] [place] Office: [phone] Pager: [phone]

## 2018-04-04 NOTE — Progress Notes (Signed)
ANTICOAGULATION CONSULT NOTE - Initial Consult  Pharmacy Consult for heparin Indication: Thrombophilia secondary to lupus  No Known Allergies  Patient Measurements: Weight: 174 lb (78.9 kg) Heparin Dosing Weight: 78.9 kg  Vital Signs: Temp: 98.3 F (36.8 C) (07/05 0557) Temp Source: Oral (07/05 0557) BP: 114/60 (07/05 0557) Pulse Rate: 78 (07/05 0557)  Labs: Recent Labs    04/02/18 1533 04/02/18 1553 04/03/18 0505 04/04/18 0419  HGB 10.6*  --  10.3*  --   HCT 33.5*  --  32.6*  --   PLT 349  --  309  --   LABPROT  --  24.2* 23.4* 20.5*  INR  --  2.20 2.10 1.78  CREATININE 0.71  --   --   --     Estimated Creatinine Clearance: 74.8 mL/min (by C-G formula based on SCr of 0.71 mg/dL).   Assessment: 77 yo M presents on 7/3 with redness and swelling of stump. On Coumadin 5mg  PO daily PTA for thrombophilia. Pharmacy consulted to transition to heparin when INR < 1.7  INR trending down = 1.78 today  Goal of Therapy:  Heparin level 0.3-0.7 units/ml Monitor platelets by anticoagulation protocol: Yes   Plan:  Hold Coumadin Start heparin when INR < 1.7, likely tomorrow Monitor daily INR, CBC, s/s of bleed  Thank you Okey RegalLisa Brinsley Wence, PharmD [phone]  04/04/2018,8:56 AM

## 2018-04-04 NOTE — Care Management Important Message (Signed)
Important Message  Patient Details  Name: Jose Bush MRN: [id number] Date of Birth: 07-Aug-1941   Medicare Important Message Given:  Yes    Jlen Wintle 04/04/2018, 4:18 PM

## 2018-04-04 NOTE — Progress Notes (Signed)
Initial Nutrition Assessment  DOCUMENTATION CODES:   Not applicable  INTERVENTION:   -Continue Ensure Enlive po BID, each supplement provides 350 kcal and 20 grams of protein -MVI with minerals daily  NUTRITION DIAGNOSIS:   Increased nutrient needs related to post-op healing as evidenced by estimated needs.  GOAL:   Patient will meet greater than or equal to 90% of their needs  MONITOR:   PO intake, Supplement acceptance, Labs, Weight trends, Skin, I & O's  REASON FOR ASSESSMENT:   Malnutrition Screening Tool    ASSESSMENT:   Clearnce [name] is a 77 y.o. male s/p right BKA 2 weeks ago after a failed toe amputation. He was doing well until about 48 hours ago when he developed redness and swelling and pain in the stump.   Pt admitted with cellulitis on recent BKA. Per vascular notes, may require debridement vs AKA.   Spoke with pt and wife at bedside. Both report pt underwent rt BKA approximately 2 weeks ago. He has been residing in a rehab facility; per pt, appetite has been decreased over the past 2-3 weeks related to post-op healing and [facility]. Pt wife has been bringing in food for pt to eat at SNF (such as chicken vegetable soup). Pt shares he was also consuming Boost supplements PTA. He ate 75% of his breakfast this morning  Pre-operatively, pt reports he had a very heart appetite, consuming 3 meals per day; pt consumed a balanced diet containing a wide variety of foods. Pt was maintaining wt around 205# pre-op and reports he suspects most of his weight loss is related to his amputation.    Discussed with pt and wife importance of good nutritional intake to promote healing. Pt wife requesting Boost supplements- pt likes strawberry flavor.   Labs reviewed.   NUTRITION - FOCUSED PHYSICAL EXAM:    Most Recent Value  Orbital Region  No depletion  Upper Arm Region  No depletion  Thoracic and Lumbar Region  No depletion  Buccal Region  No depletion   Temple Region  No depletion  Clavicle Bone Region  No depletion  Clavicle and Acromion Bone Region  No depletion  Scapular Bone Region  No depletion  Dorsal Hand  No depletion  Patellar Region  No depletion  Anterior Thigh Region  No depletion  Posterior Calf Region  No depletion  Edema (RD Assessment)  None  Hair  Reviewed  Eyes  Reviewed  Mouth  Reviewed  Skin  Reviewed  Nails  Reviewed       Diet Order:   Diet Order           Diet Heart Room service appropriate? Yes; Fluid consistency: Thin  Diet effective now          EDUCATION NEEDS:   Education needs have been addressed  Skin:  Skin Assessment: Skin Integrity Issues: Skin Integrity Issues:: Incisions Incisions: rt BKA incision  Last BM:  04/02/18  Height:   Ht Readings from Last 1 Encounters:  03/30/18 5\' 8"  (1.727 m)    Weight:   Wt Readings from Last 1 Encounters:  04/02/18 174 lb (78.9 kg)    Ideal Body Weight:  65.5 kg  BMI:  Body mass index is 26.46 kg/m.  Estimated Nutritional Needs:   Kcal:  1750-1950  Protein:  90-105 grams  Fluid:  > 1.7 L    Larry Alcock A. Mayford KnifeWilliams, RD, LDN, CDE Pager: [phone] After hours Pager: [phone]

## 2018-04-04 NOTE — Plan of Care (Signed)
?  Problem: Clinical Measurements: ?Goal: Will remain free from infection ?Outcome: Progressing ?  ?

## 2018-04-05 LAB — VANCOMYCIN, TROUGH: Vancomycin Tr: 12 ug/mL — ABNORMAL LOW (ref 15–20)

## 2018-04-05 LAB — COMPREHENSIVE METABOLIC PANEL
ALBUMIN: 2.8 g/dL — AB (ref 3.5–5.0)
ALK PHOS: 65 U/L (ref 38–126)
ALT: 22 U/L (ref 0–44)
AST: 30 U/L (ref 15–41)
Anion gap: 11 (ref 5–15)
BILIRUBIN TOTAL: 0.8 mg/dL (ref 0.3–1.2)
BUN: 9 mg/dL (ref 8–23)
CALCIUM: 8.6 mg/dL — AB (ref 8.9–10.3)
CO2: 22 mmol/L (ref 22–32)
Chloride: 102 mmol/L (ref 98–111)
Creatinine, Ser: 0.67 mg/dL (ref 0.61–1.24)
GFR calc Af Amer: >60 mL/min (ref >60)
GLUCOSE: 92 mg/dL (ref 70–99)
Potassium: 4 mmol/L (ref 3.5–5.1)
Sodium: 135 mmol/L (ref 135–145)
TOTAL PROTEIN: 5.8 g/dL — AB (ref 6.5–8.1)

## 2018-04-05 LAB — MAGNESIUM: Magnesium: 2 mg/dL (ref 1.7–2.4)

## 2018-04-05 LAB — HEPARIN LEVEL (UNFRACTIONATED)
Heparin Unfractionated: 0.14 IU/mL — ABNORMAL LOW (ref 0.30–0.70)
Heparin Unfractionated: 0.4 IU/mL (ref 0.30–0.70)

## 2018-04-05 LAB — PROTIME-INR
INR: 1.41
Prothrombin Time: 17.2 seconds — ABNORMAL HIGH (ref 11.4–15.2)

## 2018-04-05 MED ORDER — SODIUM CHLORIDE 0.9 % IV BOLUS
500.0000 mL | Freq: Once | INTRAVENOUS | Status: AC | PRN
  Administered 2018-04-05: 500 mL via INTRAVENOUS

## 2018-04-05 MED ORDER — WARFARIN SODIUM 5 MG PO TABS
5.0000 mg | ORAL_TABLET | Freq: Once | ORAL | Status: AC
  Administered 2018-04-05: 5 mg via ORAL
  Filled 2018-04-05: qty 1

## 2018-04-05 MED ORDER — WARFARIN - PHYSICIAN DOSING INPATIENT
Freq: Every day | Status: DC
Start: 2018-04-05 — End: 2018-04-08
  Administered 2018-04-06: 17:00:00

## 2018-04-05 MED ORDER — HEPARIN (PORCINE) IN NACL 100-0.45 UNIT/ML-% IJ SOLN
1700.0000 [IU]/h | INTRAMUSCULAR | Status: DC
  Administered 2018-04-05: 1100 [IU]/h via INTRAVENOUS
  Administered 2018-04-06: 1350 [IU]/h via INTRAVENOUS
  Administered 2018-04-06: 1500 [IU]/h via INTRAVENOUS
  Administered 2018-04-07: 1650 [IU]/h via INTRAVENOUS
  Administered 2018-04-10 (×2): 1700 [IU]/h via INTRAVENOUS
  Filled 2018-04-05 (×12): qty 250

## 2018-04-05 MED ORDER — HEPARIN BOLUS VIA INFUSION
2000.0000 [IU] | Freq: Once | INTRAVENOUS | Status: AC
Start: 2018-04-05 — End: 2018-04-05
  Administered 2018-04-05: 2000 [IU] via INTRAVENOUS
  Filled 2018-04-05: qty 2000

## 2018-04-05 NOTE — Evaluation (Signed)
Physical Therapy Evaluation Patient Details Name: Jose Bush MRN: [id number] DOB: [birth year] Today's Date: 04/05/2018   History of Present Illness  Jose Bush is a 77 y.o. 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 He was [facility] and progressing well and developed redness, swelling, and pain in the stump. He was admitted with cellulitis. 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    Clinical Impression  Pt presented supine in bed with HOB elevated, awake and willing to participate in therapy session. Prior to admission, pt reported [facility], working on transfers to w/c and ambulating with RW. Pt stated that he was performing ADLs independently. Pt currently able to ambulate a short distance with RW and min guard and perform transfers with min guard with the exception of a lateral scoot transfer at end of session. Pt with episode of emesis and then unresponsiveness. Pt required total A x2 for lateral scoot transfer back to bed. Pt's RN was immediately called and entered room. Pt was placed in Trendelenburg position in bed and vitals assessed. All VSS (HR 60-70's bpm, BP 120/67, SPO2 100%). Pt's RN called Rapid Response team as well. At end of session, pt becoming more responsive and able to answer questions, requesting to go to the bathroom. Multiple RN's still in room.  Pt would continue to benefit from skilled physical therapy services at this time while admitted and after d/c to address the below listed limitations in order to improve overall safety and independence with functional mobility.     Follow Up Recommendations Home health PT;Supervision/Assistance - 24 hour    Equipment Recommendations  None recommended by PT;Other (comment)(pt has all necessary DME at home)    Recommendations for Other Services       Precautions / Restrictions  Precautions Precautions: Fall Restrictions Weight Bearing Restrictions: Yes RLE Weight Bearing: Non weight bearing      Mobility  Bed Mobility               General bed mobility comments: Pt OOB in w/c upon arrival  Transfers Overall transfer level: Needs assistance Equipment used: Rolling walker (2 wheeled) Transfers: Sit to/from Stand;Lateral/Scoot Transfers Sit to Stand: Min guard;+2 safety/equipment        Lateral/Scoot Transfers: Total assist;+2 physical assistance;+2 safety/equipment General transfer comment: Close guarding assist required for stability on rise to standing and during functional mobiltiy. +2 assist available for stability. After unresponsive event, requiring total assist for transfer from wheelchair back to bed.   Ambulation/Gait Ambulation/Gait assistance: Min guard Gait Distance (Feet): 20 Feet Assistive device: Rolling walker (2 wheeled) Gait Pattern/deviations: (hop-to on L LE) Gait velocity: decreased Gait velocity interpretation: <1.31 ft/sec, indicative of household ambulator General Gait Details: pt steady with RW and hop-to pattern on L LE with w/c following  Stairs            Wheelchair Mobility    Modified Rankin (Stroke Patients Only)       Balance Overall balance assessment: Needs assistance Sitting-balance support: No upper extremity supported;Feet supported Sitting balance-Leahy Scale: Good     Standing balance support: Bilateral upper extremity supported;During functional activity Standing balance-Leahy Scale: Poor Standing balance comment: Relies on at lease single UE support.                              Pertinent Vitals/Pain Pain Assessment:  Faces Faces Pain Scale: Hurts little more Pain Location: R residual limb.  Pain Descriptors / Indicators: Discomfort;Sore Pain Intervention(s): Monitored during session;Repositioned    Home Living Family/patient expects to be discharged to:: Private  residence Living Arrangements: Spouse/significant other Available Help at Discharge: Family;Available 24 hours/day Type of Home: House Home Access: Ramped entrance     Home Layout: One level Home Equipment: Walker - 2 wheels;Cane - single point;Shower seat;Bedside commode      Prior Function Level of Independence: Needs assistance   Gait / Transfers Assistance Needed: At [facility] rehab was working with RW and wheelchair.   ADL's / Homemaking Assistance Needed: Reports that he had returned to independence with dressing and bathing at w/c level.         Hand Dominance   Dominant Hand: Right    Extremity/Trunk Assessment   Upper Extremity Assessment Upper Extremity Assessment: Defer to OT evaluation    Lower Extremity Assessment Lower Extremity Assessment: RLE deficits/detail RLE Deficits / Details: s/p transtibial amputation; pt with good AROM at knee and hip       Communication   Communication: No difficulties  Cognition Arousal/Alertness: Awake/alert Behavior During Therapy: WFL for tasks assessed/performed Overall Cognitive Status: Within Functional Limits for tasks assessed                                 General Comments: However, pt did have episode of minimal and then unresponsiveness. Pt becoming nauseated during ambulation and required seated rest break. Remained responsive but with episode of vomiting. Notified RN. OT/PT beginning to reposition pt back to other side of bed. Noticed decreasing responsiveness and RN arriving at room at that time. Assisted pt back to bed with total assistance and into trendelenburg. No response to questioning at this time. Pt becoming responsive after in trendelenburg position. Multiple RN's attending to pt and rapid response notified.       General Comments      Exercises     Assessment/Plan    PT Assessment Patient needs continued PT services  PT Problem List Decreased strength;Decreased range of motion;Decreased  activity tolerance;Decreased balance;Decreased mobility;Decreased coordination;Decreased knowledge of use of DME;Decreased knowledge of precautions;Decreased safety awareness;Pain       PT Treatment Interventions DME instruction;Gait training;Stair training;Functional mobility training;Therapeutic activities;Therapeutic exercise;Balance training;Neuromuscular re-education;Patient/family education    PT Goals (Current goals can be found in the Care Plan section)  Acute Rehab PT Goals Patient Stated Goal: return home PT Goal Formulation: With patient Time For Goal Achievement: 04/19/18 Potential to Achieve Goals: Good    Frequency Min 3X/week   Barriers to discharge        Co-evaluation PT/OT/SLP Co-Evaluation/Treatment: Yes Reason for Co-Treatment: For patient/therapist safety;To address functional/ADL transfers PT goals addressed during session: Mobility/safety with mobility;Balance;Proper use of DME;Strengthening/ROM OT goals addressed during session: ADL's and self-care       AM-PAC PT "6 Clicks" Daily Activity  Outcome Measure Difficulty turning over in bed (including adjusting bedclothes, sheets and blankets)?: A Little Difficulty moving from lying on back to sitting on the side of the bed? : A Little Difficulty sitting down on and standing up from a chair with arms (e.g., wheelchair, bedside commode, etc,.)?: Unable Help needed moving to and from a bed to chair (including a wheelchair)?: A Little Help needed walking in hospital room?: A Little Help needed climbing 3-5 steps with a railing? : Total 6 Click Score: 14  End of Session Equipment Utilized During Treatment: Gait belt Activity Tolerance: Other (comment)(limited secondary to emesis and episode of unresponsiveness) Patient left: in bed;with call bell/phone within reach;with nursing/sitter in room;Other (comment)(multiple RN's present with pt in trendelenburg position) Nurse Communication: Mobility status;Other  (comment)(emesis and unresponsiveness) PT Visit Diagnosis: Other abnormalities of gait and mobility (R26.89);Difficulty in walking, not elsewhere classified (R26.2)    Time: 1610-9604 PT Time Calculation (min) (ACUTE ONLY): 31 min   Charges:   PT Evaluation $PT Eval Moderate Complexity: 1 Mod     PT G Codes:        [place], PT, DPT [phone]   Alessandra Bevels Corah Willeford 04/05/2018, 1:16 PM

## 2018-04-05 NOTE — Significant Event (Addendum)
Rapid Response Event Note  Overview: Time Called: 1035 Arrival Time: 1040 Event Type: Hypotension  Initial Focused Assessment: Per RN patient had been up with PT using the walker.  He sat in the chair for a "rest", apparently had some prune juice and became nauseated.  He then became diaphoretic and difficult to arouse.  Staff assisted him back to bed and placed him in trendelenburg.    Upon my arrival patient lying in trendelenburg with 100% NRB mask.   BP 120/67  HR 74  RR 18  O2 sat 100% Lung sounds clear heart tones regular Diaphoresis improving He is alert and oriented  Interventions: 500cc bolus NS 12 lead EKG done Remove O2 mask, patient O2 sat 97% on RA  1130 [facility].  Plan of Care (if not transferred): RN to call if futher assistance needed.   Event Summary: Name of Physician Notified: Edilia BoDickson at 1045    at    Outcome: Stayed in room and stabalized  Event End Time: 1130  Marcellina MillinLayton, Javaris Wigington

## 2018-04-05 NOTE — Progress Notes (Signed)
Spoke to [employer] In Administrator, artsharmacy. Confirmed need to admin heparin gtt today due to INR of 1.4. Pharmacy to place order for labs.

## 2018-04-05 NOTE — Progress Notes (Signed)
RR called, pt nauseated & throwing up with PT. Entered room to administer antinausea medication and pt was unresponsive in wheelchair. Pt returned to bed with assistance of PT techs. Pt placed in trendelenburg and noted to be diaphoretic and clammy. O2 placed on pt via mask. Pt became alert again and stated the prune juice made him sick. IV Bolus of NS given per RR RN.    1048 Dr Edilia Boickson paged. VO to continue heparin gtt, 12 lead EKG and CMP, MAG.   1115 EKG performed per MD order. SR with 1st degree block noted. Previous EKG noted in 2018 to have SR with 1st degree block.   1130 Pt sat on side of bed and [facility] one assist. No complaints during transfer. Pt attempting to have BM at this time.

## 2018-04-05 NOTE — Progress Notes (Signed)
Pharmacy Antibiotic Note  Jose Bush is a 77 y.o. male admitted on 04/02/2018 with cellulitis.  Pharmacy has been consulted for vancomycin dosing.  Day #4 of abx for cellulitis of recent BKA stump. Minimal drainage. Vascular consulted and may take back to OR on 7/8 if needed. May need AKA if not resolving. VT this morning was therapeutic at 12. Afebrile, WBC wnl.  Plan: Continue vancomycin 750mg  IV Q12h (goal 10-15) Monitor clinical picture, renal function, VT prn F/U C&S, abx deescalation / LOT   Weight: 174 lb (78.9 kg)  Temp (24hrs), Avg:98.4 F (36.9 C), Min:97.6 F (36.4 C), Max:98.9 F (37.2 C)  Recent Labs  Lab 04/02/18 1533 04/02/18 1540 04/03/18 0505 04/05/18 0622  WBC 9.5  --  10.3  --   CREATININE 0.71  --   --   --   LATICACIDVEN  --  2.01*  --   --   VANCOTROUGH  --   --   --  12*    Estimated Creatinine Clearance: 74.8 mL/min (by C-G formula based on SCr of 0.71 mg/dL).    No Known Allergies   Thank you for allowing pharmacy to be a part of this patient's care.  Armandina StammerBATCHELDER,Shane Melby J 04/05/2018 7:49 AM

## 2018-04-05 NOTE — Progress Notes (Signed)
ANTICOAGULATION CONSULT NOTE - Follow-up Consult  Pharmacy Consult for heparin Indication: Thrombophilia secondary to lupus  No Known Allergies  Patient Measurements: Weight: 174 lb (78.9 kg) Heparin Dosing Weight: 78.9 kg  Vital Signs: BP: 102/59 (07/06 1158) Pulse Rate: 79 (07/06 1158)  Labs: Recent Labs    04/03/18 0505 04/04/18 0419 04/05/18 0622 04/05/18 1550  HGB 10.3*  --   --   --   HCT 32.6*  --   --   --   PLT 309  --   --   --   LABPROT 23.4* 20.5* 17.2*  --   INR 2.10 1.78 1.41  --   HEPARINUNFRC  --   --   --  0.14*  CREATININE  --   --  0.67  --     Estimated Creatinine Clearance: 74.8 mL/min (by C-G formula based on SCr of 0.67 mg/dL).   Assessment: 77 yo M presents on 7/3 with redness and swelling of stump. On Coumadin 5mg  daily PTA for thrombophilia 2/2 to lupus, on hold for possible surgery. INR trending down to 1.41 today. Started heparin today (7/6) for bridge. Hgb low but stable at 10.3, plts wnl.  7/6 PM Update: First HL subtherapeutic at 0.14  Goal of Therapy:  Heparin level 0.3-0.7 units/ml Monitor platelets by anticoagulation protocol: Yes   Plan:  Give heparin bolus of 2000 units x1. Increase heparin gtt 1,350 units/hr. Monitor 6 hr heparin level. Coumadin 5mg  PO x 1 tonight per MD Monitor daily INR, CBC, s/sx of bleed  Arvilla MarketMelissa Lore, PharmD PGY1 Pharmacy Resident Phone [phone] 04/05/2018     4:52 PM

## 2018-04-05 NOTE — Plan of Care (Signed)
  Problem: Education: Goal: Knowledge of General Education information will improve Outcome: Progressing   Problem: Clinical Measurements: Goal: Will remain free from infection Outcome: Progressing Goal: Diagnostic test results will improve Outcome: Progressing   Problem: Nutrition: Goal: Adequate nutrition will be maintained Outcome: Progressing   Problem: Pain Managment: Goal: General experience of comfort will improve Outcome: Progressing   Problem: Skin Integrity: Goal: Risk for impaired skin integrity will decrease Outcome: Progressing

## 2018-04-05 NOTE — Progress Notes (Signed)
ANTICOAGULATION CONSULT NOTE - Initial Consult  Pharmacy Consult for heparin Indication: Thrombophilia secondary to lupus  No Known Allergies  Patient Measurements: Weight: 174 lb (78.9 kg) Heparin Dosing Weight: 78.9 kg  Vital Signs: Temp: 98.6 F (37 C) (07/06 0446) Temp Source: Oral (07/06 0446) BP: 118/57 (07/06 0446) Pulse Rate: 71 (07/06 0446)  Labs: Recent Labs    04/02/18 1533  04/03/18 0505 04/04/18 0419 04/05/18 0622  HGB 10.6*  --  10.3*  --   --   HCT 33.5*  --  32.6*  --   --   PLT 349  --  309  --   --   LABPROT  --    < > 23.4* 20.5* 17.2*  INR  --    < > 2.10 1.78 1.41  CREATININE 0.71  --   --   --   --    < > = values in this interval not displayed.    Estimated Creatinine Clearance: 74.8 mL/min (by C-G formula based on SCr of 0.71 mg/dL).   Assessment: 77 yo M presents on 7/3 with redness and swelling of stump. On Coumadin 5mg  daily PTA for thrombophilia 2/2 to lupus, on hold for possible surgery. INR trending down to 1.41 today. Will start heparin today for bridge. Hgb low but stable at 10.3, plts wnl.  Goal of Therapy:  Heparin level 0.3-0.7 units/ml Monitor platelets by anticoagulation protocol: Yes   Plan:  No heparin bolus Start heparin gtt 1,100 units/hr Coumadin 5mg  PO x 1 tonight per MD Monitor daily INR / heparin level, CBC, s/s of bleed  Enzo BiNathan Delayne Sanzo, PharmD, BCPS Clinical Pharmacist Phone number [phone] 04/05/2018 8:00 AM

## 2018-04-05 NOTE — Progress Notes (Signed)
   VASCULAR SURGERY ASSESSMENT & PLAN:   CELLULITIS RIGHT BKA: I inspected his wound this morning.  I am seeing this for the first time.  He has mild cellulitis with minimal drainage.  There is some eschar on the lateral wound.  According to the wife, this has markedly improved.  We will look at the wound again tomorrow.  Based on what I have seen today I do not think he will require debridement in the operating room Monday however we will make that decision tomorrow.  ID: He is on intravenous vancomycin and p.o. Plaquenil.  Continue daily dressing changes.  ANTICOAGULATION: Patient was on Coumadin prior to admission.  His INR today is 1.4.  As the wound is improving, I will give a small dose of Coumadin tonight.  We can always stop this if after the dressing change tomorrow it looks like he might need to go back to the operating room.  SUBJECTIVE:   No specific complaints.  PHYSICAL EXAM:   Vitals:   04/04/18 0557 04/04/18 1233 04/04/18 2035 04/05/18 0446  BP: 114/60 (!) 106/58 (!) 110/55 (!) 118/57  Pulse: 78 91 75 71  Resp: 16 17 16 16   Temp: 98.3 F (36.8 C) 97.6 F (36.4 C) 98.9 F (37.2 C) 98.6 F (37 C)  TempSrc: Oral Oral Oral Oral  SpO2: 98% 100% 98% 97%  Weight:       I changed his dressing today.  He has some cellulitis of his right BKA.  There is minimal drainage noted.  LABS:   Lab Results  Component Value Date   WBC 10.3 04/03/2018   HGB 10.3 (L) 04/03/2018   HCT 32.6 (L) 04/03/2018   MCV 90.1 04/03/2018   PLT 309 04/03/2018    PROBLEM LIST:    Active Problems:   PAD (peripheral artery disease) (HCC)   CURRENT MEDS:   . docusate sodium  100 mg Oral BID  . feeding supplement (ENSURE ENLIVE)  237 mL Oral BID BM  . lisinopril  10 mg Oral Q breakfast   And  . hydrochlorothiazide  12.5 mg Oral Q breakfast  . hydroxychloroquine  200 mg Oral BID  . lactose free nutrition  237 mL Oral TID WC  . multivitamin with minerals  1 tablet Oral Daily  .  pantoprazole  40 mg Oral Daily  . sodium chloride flush  3 mL Intravenous Q12H  . tamsulosin  0.8 mg Oral Daily    Jose FerrariChristopher Miracle Bush Beeper: [phone] Office: [phone] 04/05/2018

## 2018-04-05 NOTE — Evaluation (Addendum)
Occupational Therapy Evaluation Patient Details Name: Jose Bush MRN: [id number] DOB: 01-06-1941 Today's Date: 04/05/2018    History of Present Illness Mr. Jose Bush is a 77 y.o. 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 He was participating in rehabilitation at [facility] and progressing well and developed redness, swelling, and pain in the stump. He was admitted with cellulitis. 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   Clinical Impression   PTA, pt was participating in rehabilitation at [facility] per pt. He reports using both wheelchair and RW for functional mobility and completing ADL with modified independence. He currently requires min guard assist for ambulating toilet transfers with RW and LB ADL tasks. Pt demonstrating some decreased activity tolerance for ADL and did require cues for safety at times. Feel pt will progress well with acute therapy services and anticipate he will benefit from home health OT follow-up post-acute D/C. He has all DME needs met.  Of note, pt reporting nausea "from drinking prune juice" during ambulation and facilitated seated rest break. Once seated, pt with episode of emesis. Notified RN who reported she would bring medications. As pt rested in wheelchair, noted progressively decreasing responsivity and becoming diaphoretic. RN quickly arrived and pt continued to decline with OT/PT assisting pt back to bed with total assistance and into trendelenburg position. RN called rapid response and multiple RNs from floor arriving to assist. Vitals stable throughout and pt becoming responsive, alert, and oriented once returned to bed and in trendelenburg position.     Follow Up Recommendations  Home health OT    Equipment Recommendations  None recommended by OT    Recommendations for Other Services       Precautions / Restrictions Precautions Precautions: Fall Restrictions Weight Bearing  Restrictions: Yes RLE Weight Bearing: Non weight bearing      Mobility Bed Mobility               General bed mobility comments: OOB in wheelchair on my arrival.   Transfers Overall transfer level: Needs assistance Equipment used: Rolling walker (2 wheeled) Transfers: Sit to/from Stand Sit to Stand: Min guard;+2 safety/equipment         General transfer comment: Close guarding assist required for stability on rise to standing and during functional mobiltiy. +2 assist available for stability. After unresponsive event, requiring total assist for transfer from wheelchair back to bed.     Balance Overall balance assessment: Needs assistance Sitting-balance support: No upper extremity supported;Feet supported Sitting balance-Leahy Scale: Good     Standing balance support: Bilateral upper extremity supported;During functional activity Standing balance-Leahy Scale: Poor Standing balance comment: Relies on at lease single UE support.                            ADL either performed or assessed with clinical judgement   ADL Overall ADL's : Needs assistance/impaired Eating/Feeding: Set up;Sitting   Grooming: Set up;Sitting   Upper Body Bathing: Set up;Sitting   Lower Body Bathing: Min guard;Sit to/from stand   Upper Body Dressing : Set up;Sitting   Lower Body Dressing: Min guard;Sit to/from stand   Toilet Transfer: Ambulation;RW;Min Psychiatric nurse Details (indicate cue type and reason): simulated with sit<>stand followed by functional mobility in room Toileting- Water quality scientist and Hygiene: Min guard;Sit to/from stand       Functional mobility during ADLs: Rolling walker;Min guard General ADL Comments: Pt able to complete LB  dressing and ambulation in room this session. See cognition section for details of significant medical event during session.      Vision Patient Visual Report: No change from baseline Vision Assessment?: No apparent  visual deficits     Perception     Praxis      Pertinent Vitals/Pain Pain Assessment: Faces Faces Pain Scale: Hurts little more Pain Location: R residual limb.  Pain Descriptors / Indicators: Discomfort;Sore Pain Intervention(s): Monitored during session;Repositioned     Hand Dominance Right   Extremity/Trunk Assessment Upper Extremity Assessment Upper Extremity Assessment: Overall WFL for tasks assessed   Lower Extremity Assessment Lower Extremity Assessment: RLE deficits/detail;Defer to PT evaluation RLE Deficits / Details: s/p BKA       Communication Communication Communication: No difficulties   Cognition Arousal/Alertness: Awake/alert Behavior During Therapy: WFL for tasks assessed/performed Overall Cognitive Status: Within Functional Limits for tasks assessed                                 General Comments: However, pt did have episode of minimal and then unresponsiveness. Pt becoming nauseated during ambulation and required seated rest break. Remained responsive but with episode of vomiting. Notified RN. OT/PT beginning to reposition pt back to other side of bed. Noticed decreasing responsiveness and RN arriving at room at that time. Assisted pt back to bed with total assistance and into trendelenburg. No response to questioning at this time. Pt becoming responsive after in trendelenburg position. Multiple RN's attending to pt and rapid response notified.    General Comments       Exercises     Shoulder Instructions      Home Living Family/patient expects to be discharged to:: Private residence Living Arrangements: Spouse/significant other Available Help at Discharge: Family;Available 24 hours/day Type of Home: House Home Access: Ramped entrance     Home Layout: One level     Bathroom Shower/Tub: International aid/development worker Accessibility: Yes How Accessible: Accessible via wheelchair Home Equipment: [place] - 2 wheels;Cane - single  point;Shower seat;Bedside commode          Prior Functioning/Environment Level of Independence: Needs assistance  Gait / Transfers Assistance Needed: At [facility] rehab was working with RW and wheelchair.  ADL's / Homemaking Assistance Needed: Reports that he had returned to independence with dressing and bathing at w/c level.             OT Problem List: Decreased strength;Decreased range of motion;Decreased activity tolerance;Impaired balance (sitting and/or standing);Decreased safety awareness;Decreased knowledge of use of DME or AE;Decreased knowledge of precautions;Pain      OT Treatment/Interventions: Self-care/ADL training;Therapeutic exercise;Energy conservation;DME and/or AE instruction;Therapeutic activities;Patient/family education;Balance training    OT Goals(Current goals can be found in the care plan section) Acute Rehab OT Goals Patient Stated Goal: tp get home OT Goal Formulation: With patient Time For Goal Achievement: 04/19/18 Potential to Achieve Goals: Good ADL Goals Pt Will Perform Lower Body Dressing: sit to/from stand;with modified independence Pt Will Transfer to Toilet: with modified independence;ambulating;regular height toilet Pt Will Perform Toileting - Clothing Manipulation and hygiene: with modified independence;sit to/from stand Pt Will Perform Tub/Shower Transfer: Shower transfer;with modified independence;shower seat;rolling walker;Stand pivot transfer;ambulating  OT Frequency: Min 2X/week   Barriers to D/C:            Co-evaluation PT/OT/SLP Co-Evaluation/Treatment: Yes Reason for Co-Treatment: For patient/therapist safety;Complexity of the patient's impairments (multi-system involvement)   OT goals  addressed during session: ADL's and self-care      AM-PAC PT "6 Clicks" Daily Activity     Outcome Measure Help from another person eating meals?: None Help from another person taking care of personal grooming?: None Help from another person  toileting, which includes using toliet, bedpan, or urinal?: A Little Help from another person bathing (including washing, rinsing, drying)?: A Little Help from another person to put on and taking off regular upper body clothing?: None Help from another person to put on and taking off regular lower body clothing?: A Little 6 Click Score: 21   End of Session Equipment Utilized During Treatment: Gait belt;Rolling walker;Oxygen(wheelchair) Nurse Communication: Mobility status  Activity Tolerance: Treatment limited by medical complications (decreased responsivity) Patient left: in bed;with nursing/sitter in room(with multiple RN present; RR on the way)  OT Visit Diagnosis: Other abnormalities of gait and mobility (R26.89);Pain Pain - Right/Left: Right Pain - part of body: Leg(residual limb)                Time: 4239-5320 OT Time Calculation (min): 32 min Charges:  OT General Charges $OT Visit: 1 Visit OT Evaluation $OT Eval Moderate Complexity: 1 Mod G-Codes:     Norman Herrlich, MS OTR/L  Pager: [place] Peace 04/05/2018, 1:02 PM

## 2018-04-06 LAB — CBC
HEMATOCRIT: 31 % — AB (ref 39.0–52.0)
Hemoglobin: 9.7 g/dL — ABNORMAL LOW (ref 13.0–17.0)
MCH: 28.3 pg (ref 26.0–34.0)
MCHC: 31.3 g/dL (ref 30.0–36.0)
MCV: 90.4 fL (ref 78.0–100.0)
Platelets: 274 10*3/uL (ref 150–400)
RBC: 3.43 MIL/uL — AB (ref 4.22–5.81)
RDW: 14.4 % (ref 11.5–15.5)
WBC: 5.5 10*3/uL (ref 4.0–10.5)

## 2018-04-06 LAB — BASIC METABOLIC PANEL
Anion gap: 6 (ref 5–15)
BUN: 10 mg/dL (ref 8–23)
CALCIUM: 8.7 mg/dL — AB (ref 8.9–10.3)
CHLORIDE: 106 mmol/L (ref 98–111)
CO2: 25 mmol/L (ref 22–32)
Creatinine, Ser: 0.69 mg/dL (ref 0.61–1.24)
GFR calc non Af Amer: >60 mL/min (ref >60)
GLUCOSE: 91 mg/dL (ref 70–99)
POTASSIUM: 4 mmol/L (ref 3.5–5.1)
Sodium: 137 mmol/L (ref 135–145)

## 2018-04-06 LAB — PROTIME-INR
INR: 1.35
Prothrombin Time: 16.5 seconds — ABNORMAL HIGH (ref 11.4–15.2)

## 2018-04-06 LAB — HEPARIN LEVEL (UNFRACTIONATED): Heparin Unfractionated: 0.3 IU/mL (ref 0.30–0.70)

## 2018-04-06 MED ORDER — WARFARIN SODIUM 5 MG PO TABS
5.0000 mg | ORAL_TABLET | Freq: Once | ORAL | Status: AC
  Administered 2018-04-06: 5 mg via ORAL
  Filled 2018-04-06: qty 1

## 2018-04-06 NOTE — Progress Notes (Signed)
   VASCULAR SURGERY ASSESSMENT & PLAN:   CELLULITIS RIGHT BKA: I inspected his BKA again this morning.  His cellulitis has definitely improved.  He did have some serous drainage on the lateral aspect of his dressing.  I think he will be okay with continued antibiotics and dressing changes, however I will make him n.p.o. after midnight so that if there are any changes overnight he could potentially go to the operating room tomorrow for washout of the wound if necessary.  Currently I do not think that is necessary.  ID: He is on intravenous vancomycin and p.o. Plaquenil.  His blood cultures were negative.  ANTICOAGULATION: Patient was on Coumadin prior to admission.  His INR today is 1.35.  As the wound is improving, I will give a small dose of Coumadin again tonight.  GU: He had a catheter placed by the urologist a week ago and was told to remove the catheter in 1 week.  Therefore I will remove his catheter today.  SUBJECTIVE:   No specific complaints this morning.  PHYSICAL EXAM:   Vitals:   04/05/18 1158 04/05/18 1714 04/05/18 2015 04/06/18 0514  BP: (!) 102/59 113/62 (!) 119/57 125/69  Pulse: 79 97 80 74  Resp: 16     Temp:  98.3 F (36.8 C) 98.2 F (36.8 C) 98.2 F (36.8 C)  TempSrc:  Oral Oral Oral  SpO2: 100% 97% 97% 96%  Weight:       I inspected his right BKA.  There was some serous drainage on the lateral aspect of the dressing.  The cellulitis is improved.  The majority of the staple line looks good.  LABS:   Lab Results  Component Value Date   WBC 5.5 04/06/2018   HGB 9.7 (L) 04/06/2018   HCT 31.0 (L) 04/06/2018   MCV 90.4 04/06/2018   PLT 274 04/06/2018   Lab Results  Component Value Date   CREATININE 0.69 04/06/2018   Lab Results  Component Value Date   INR 1.35 04/06/2018    PROBLEM LIST:    Active Problems:   PAD (peripheral artery disease) (HCC)   CURRENT MEDS:   . docusate sodium  100 mg Oral BID  . feeding supplement (ENSURE ENLIVE)  237 mL  Oral BID BM  . lisinopril  10 mg Oral Q breakfast   And  . hydrochlorothiazide  12.5 mg Oral Q breakfast  . hydroxychloroquine  200 mg Oral BID  . lactose free nutrition  237 mL Oral TID WC  . multivitamin with minerals  1 tablet Oral Daily  . pantoprazole  40 mg Oral Daily  . sodium chloride flush  3 mL Intravenous Q12H  . tamsulosin  0.8 mg Oral Daily  . Warfarin - Physician Dosing Inpatient   Does not apply q1800    Waverly FerrariChristopher Dickson Beeper: [phone] Office: [phone] 04/06/2018

## 2018-04-06 NOTE — Progress Notes (Signed)
ANTICOAGULATION CONSULT NOTE - Follow-up Consult  Pharmacy Consult for heparin Indication: Thrombophilia secondary to lupus  No Known Allergies  Patient Measurements: Weight: 174 lb (78.9 kg) Heparin Dosing Weight: 78.9 kg  Vital Signs: Temp: 98.2 F (36.8 C) (07/07 0514) Temp Source: Oral (07/07 0514) BP: 125/69 (07/07 0514) Pulse Rate: 74 (07/07 0514)  Labs: Recent Labs    04/04/18 0419 04/05/18 0622 04/05/18 1550 04/05/18 2313 04/06/18 0451  HGB  --   --   --   --  9.7*  HCT  --   --   --   --  31.0*  PLT  --   --   --   --  274  LABPROT 20.5* 17.2*  --   --  16.5*  INR 1.78 1.41  --   --  1.35  HEPARINUNFRC  --   --  0.14* 0.40 0.30  CREATININE  --  0.67  --   --  0.69    Estimated Creatinine Clearance: 74.8 mL/min (by C-G formula based on SCr of 0.69 mg/dL).   Assessment: 77 yo M presents on 7/3 with redness and swelling of stump. On warfarin 5mg  daily PTA for thrombophilia due to to lupus, was on hold d/t possible surgery > heparin started as bridge, warfarin was restarted 7/6 per MD.  Heparin level this morning at the lower end of therapeutic range. Will give a small rate increase to 1500 units/hr and recheck level tomorrow morning. No bleeding noted.  Goal of Therapy:  Heparin level 0.3-0.7 units/ml Monitor platelets by anticoagulation protocol: Yes   Plan:  Increase heparin infusion to 1500 units/hr. Monitor daily heparin level and CBC Follow s/s bleeding  Harlow MaresAmy Jesstin Studstill, PharmD PGY1 Pharmacy Resident Phone [phone]  04/06/2018   8:00 AM

## 2018-04-06 NOTE — Progress Notes (Signed)
ANTICOAGULATION CONSULT NOTE - Follow-up Consult  Pharmacy Consult for heparin Indication: Thrombophilia secondary to lupus  No Known Allergies  Patient Measurements: Weight: 174 lb (78.9 kg) Heparin Dosing Weight: 78.9 kg  Vital Signs: Temp: 98.2 F (36.8 C) (07/06 2015) Temp Source: Oral (07/06 2015) BP: 119/57 (07/06 2015) Pulse Rate: 80 (07/06 2015)  Labs: Recent Labs    04/03/18 0505 04/04/18 0419 04/05/18 0622 04/05/18 1550 04/05/18 2313  HGB 10.3*  --   --   --   --   HCT 32.6*  --   --   --   --   PLT 309  --   --   --   --   LABPROT 23.4* 20.5* 17.2*  --   --   INR 2.10 1.78 1.41  --   --   HEPARINUNFRC  --   --   --  0.14* 0.40  CREATININE  --   --  0.67  --   --     Estimated Creatinine Clearance: 74.8 mL/min (by C-G formula based on SCr of 0.67 mg/dL).   Assessment: 77 yo M presents on 7/3 with redness and swelling of stump. On warfarin 5mg  daily PTA for thrombophilia due to to lupus, now on hold for possible surgery > heparin started as bridge, warfarin was restarted today per MD.  Heparin level this evening therapeutic after re-bolus and rate increase earlier today. No bleeding noted.  Goal of Therapy:  Heparin level 0.3-0.7 units/ml Monitor platelets by anticoagulation protocol: Yes   Plan:  Continue heparin infusion at 1350 units/hr. Monitor daily heparin level and CBC Follow s/s bleeding  Azizi Bally D. Ikechukwu Cerny, PharmD, BCPS Clinical Pharmacist [phone] Please check AMION for all [facility] numbers 04/06/2018 12:09 AM

## 2018-04-07 LAB — CULTURE, BLOOD (ROUTINE X 2)
CULTURE: NO GROWTH
Culture: NO GROWTH
Special Requests: ADEQUATE
Special Requests: ADEQUATE

## 2018-04-07 LAB — CBC
HCT: 30.1 % — ABNORMAL LOW (ref 39.0–52.0)
Hemoglobin: 9.6 g/dL — ABNORMAL LOW (ref 13.0–17.0)
MCH: 28.5 pg (ref 26.0–34.0)
MCHC: 31.9 g/dL (ref 30.0–36.0)
MCV: 89.3 fL (ref 78.0–100.0)
PLATELETS: 253 10*3/uL (ref 150–400)
RBC: 3.37 MIL/uL — AB (ref 4.22–5.81)
RDW: 14.4 % (ref 11.5–15.5)
WBC: 4.6 10*3/uL (ref 4.0–10.5)

## 2018-04-07 LAB — PROTIME-INR
INR: 1.31
Prothrombin Time: 16.2 seconds — ABNORMAL HIGH (ref 11.4–15.2)

## 2018-04-07 LAB — HEPARIN LEVEL (UNFRACTIONATED)
Heparin Unfractionated: 0.16 IU/mL — ABNORMAL LOW (ref 0.30–0.70)
Heparin Unfractionated: 0.45 IU/mL (ref 0.30–0.70)

## 2018-04-07 MED ORDER — POLYETHYLENE GLYCOL 3350 17 G PO PACK
17.0000 g | PACK | Freq: Every day | ORAL | Status: DC
  Administered 2018-04-07 – 2018-04-11 (×5): 17 g via ORAL
  Filled 2018-04-07 (×5): qty 1

## 2018-04-07 NOTE — Progress Notes (Signed)
Physical Therapy Treatment Patient Details Name: Jose HastenWilliam L Taney MRN: [id number] DOB: 1941/03/15 Today's Date: 04/07/2018    History of Present Illness Mr. Jose Bush is a 77 y.o. 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 [facility] and progressing well and developed redness, swelling, and pain in the stump. He was admitted with cellulitis. 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    PT Comments    Pt progressing towards physical therapy goals. Overall pt is motivated to participate and expresses his commitment to eventually getting a prosthesis. Pt was able to demonstrate wheelchair mobility in the hall with min assist for occasional steering and management of the IV pole. Pt was educated on safety with mobilizing in the wheelchair alone, as pt was attempting to leave room in the wheelchair with the IV pole when PT arrived. Wife present upon end of session and safety was again reinforced. HEP handout provided this session. Will continue to follow and progress as able per POC.    Follow Up Recommendations  Home health PT;Supervision/Assistance - 24 hour     Equipment Recommendations  None recommended by PT;Other (comment)(pt has all necessary DME at home)    Recommendations for Other Services       Precautions / Restrictions Precautions Precautions: Fall Restrictions Weight Bearing Restrictions: Yes RLE Weight Bearing: Non weight bearing    Mobility  Bed Mobility               General bed mobility comments: Pt OOB in w/c upon arrival  Transfers                    Ambulation/Gait                 Dance movement psychotherapisttairs             Wheelchair Mobility Wheelchair Mobility Wheelchair mobility: Yes Wheelchair propulsion: Both upper extremities;Left lower extremity Wheelchair parts: Supervision/cueing Distance: 225 Wheelchair Assistance Details (indicate cue  type and reason): Slow propulsion speed and required occasional assist to steer.   Modified Rankin (Stroke Patients Only)       Balance Overall balance assessment: Needs assistance Sitting-balance support: No upper extremity supported;Feet supported Sitting balance-Leahy Scale: Good   Postural control: Right lateral lean                                  Cognition Arousal/Alertness: Awake/alert Behavior During Therapy: WFL for tasks assessed/performed Overall Cognitive Status: Within Functional Limits for tasks assessed                                        Exercises Other Exercises Other Exercises: Pt fatigued from wheelchair mobility in hall, so verbally reviewed HEP with handout given. Pt practiced a few of each. Exercises given: quad sets, hip abd/add, LAQ, chair push up, hip extension in supine. Medbridge access code: [email or web address]    General Comments        Pertinent Vitals/Pain Pain Assessment: Faces Faces Pain Scale: Hurts a little bit Pain Location: R residual limb.  Pain Descriptors / Indicators: Discomfort;Sore Pain Intervention(s): Monitored during session    Home Living                      Prior Function  PT Goals (current goals can now be found in the care plan section) Acute Rehab PT Goals Patient Stated Goal: return home PT Goal Formulation: With patient Time For Goal Achievement: 04/19/18 Potential to Achieve Goals: Good Progress towards PT goals: Progressing toward goals    Frequency    Min 3X/week      PT Plan Current plan remains appropriate    Co-evaluation              AM-PAC PT "6 Clicks" Daily Activity  Outcome Measure  Difficulty turning over in bed (including adjusting bedclothes, sheets and blankets)?: A Little Difficulty moving from lying on back to sitting on the side of the bed? : A Little Difficulty sitting down on and standing up from a chair with arms (e.g.,  wheelchair, bedside commode, etc,.)?: A Lot Help needed moving to and from a bed to chair (including a wheelchair)?: A Little Help needed walking in hospital room?: A Little Help needed climbing 3-5 steps with a railing? : Total 6 Click Score: 15    End of Session   Activity Tolerance: Patient tolerated treatment well Patient left: with call bell/phone within reach;with family/visitor present;Other (comment)(in wheelchair) Nurse Communication: Mobility status PT Visit Diagnosis: Other abnormalities of gait and mobility (R26.89);Difficulty in walking, not elsewhere classified (R26.2) Pain - Right/Left: Right Pain - part of body: Leg     Time: 1132-1205 PT Time Calculation (min) (ACUTE ONLY): 33 min  Charges:  $Therapeutic Activity: 23-37 mins                    G Codes:       Conni Slipper, PT, DPT Acute Rehabilitation Services Pager: [phone]    Marylynn Pearson 04/07/2018, 12:49 PM

## 2018-04-07 NOTE — Progress Notes (Signed)
[facility]  Subjective  - feels ok   Objective (!) 154/70 83 98.7 F (37.1 C) (Oral) 16 99% No intake or output data in the 24 hours ending 04/07/18 0900   Cellulitis right BKA continues to recede except lateral corner. Some serous drainage from this area as well  Assessment/Planning: Signiticantly improved but still tenous will continue IV antibiotics for a full 10 day course then reassess.  This will end Saturday this week  Coumadin resumed by Dr Edilia Boickson over the weekend    Jose Bush 04/07/2018 9:00 AM --  Laboratory Lab Results: Recent Labs    04/06/18 0451 04/07/18 0445  WBC 5.5 4.6  HGB 9.7* 9.6*  HCT 31.0* 30.1*  PLT 274 253   BMET Recent Labs    04/05/18 0622 04/06/18 0451  NA 135 137  K 4.0 4.0  CL 102 106  CO2 22 25  GLUCOSE 92 91  BUN 9 10  CREATININE 0.67 0.69  CALCIUM 8.6* 8.7*    COAG Lab Results  Component Value Date   INR 1.31 04/07/2018   INR 1.35 04/06/2018   INR 1.41 04/05/2018   No results found for: PTT

## 2018-04-07 NOTE — Progress Notes (Signed)
ANTICOAGULATION CONSULT NOTE  Pharmacy Consult:  Heparin Indication: Thrombophilia secondary to lupus  No Known Allergies  Patient Measurements: Weight: 174 lb (78.9 kg) Heparin Dosing Weight: 79 kg  Vital Signs: Temp: 98.7 F (37.1 C) (07/08 0611) Temp Source: Oral (07/08 0611) BP: 154/70 (07/08 0611) Pulse Rate: 83 (07/08 0611)  Labs: Recent Labs    04/05/18 0622  04/06/18 0451 04/07/18 0445 04/07/18 1240  HGB  --   --  9.7* 9.6*  --   HCT  --   --  31.0* 30.1*  --   PLT  --   --  274 253  --   LABPROT 17.2*  --  16.5* 16.2*  --   INR 1.41  --  1.35 1.31  --   HEPARINUNFRC  --    < > 0.30 0.16* 0.45  CREATININE 0.67  --  0.69  --   --    < > = values in this interval not displayed.    Estimated Creatinine Clearance: 74.8 mL/min (by C-G formula based on SCr of 0.69 mg/dL).   Assessment: [age] YOM presents on 04/02/18 with redness and swelling of stump. Patient was on Coumadin PTA for thrombophilia due to to lupus, held for possible surgery and IV heparin bridge started.  Heparin level is therapeutic and INR is sub-therapeutic.  No bleeding reported.   Goal of Therapy:  Heparin level 0.3-0.7 units/ml Monitor platelets by anticoagulation protocol: Yes    Plan:  Continue heparin gtt at 1650 units/hr Daily heparin level and CBC Coumadin per Vascular   Evanee Lubrano D. Laney Potashang, PharmD, BCPS, BCCCP 04/07/2018, 1:34 PM

## 2018-04-07 NOTE — Progress Notes (Signed)
ANTICOAGULATION CONSULT NOTE - Follow-up Consult  Pharmacy Consult for Heparin Indication: Thrombophilia secondary to lupus  No Known Allergies  Patient Measurements: Weight: 174 lb (78.9 kg) Heparin Dosing Weight: 78.9 kg  Vital Signs: Temp: 98.6 F (37 C) (07/07 2222) Temp Source: Oral (07/07 2222) BP: 139/75 (07/07 2222) Pulse Rate: 79 (07/07 2222)  Labs: Recent Labs    04/05/18 0622  04/05/18 2313 04/06/18 0451 04/07/18 0445  HGB  --   --   --  9.7* 9.6*  HCT  --   --   --  31.0* 30.1*  PLT  --   --   --  274 253  LABPROT 17.2*  --   --  16.5* 16.2*  INR 1.41  --   --  1.35 1.31  HEPARINUNFRC  --    < > 0.40 0.30 0.16*  CREATININE 0.67  --   --  0.69  --    < > = values in this interval not displayed.    Estimated Creatinine Clearance: 74.8 mL/min (by C-G formula based on SCr of 0.69 mg/dL).   Assessment: 77 yo M presents on 7/3 with redness and swelling of stump. On warfarin 5mg  daily PTA for thrombophilia due to to lupus, now on hold for possible surgery > heparin started as bridge, warfarin was restarted today per MD.  7/8 AM update: heparin level low this AM, INR down to 1.31  Goal of Therapy:  Heparin level 0.3-0.7 units/ml Monitor platelets by anticoagulation protocol: Yes   Plan:  Inc heparin to 1650 units/hr 1300 HL  Abran DukeJames Uri Turnbough, PharmD, BCPS Clinical Pharmacist Phone: [phone]

## 2018-04-07 NOTE — Plan of Care (Signed)

## 2018-04-07 NOTE — Progress Notes (Signed)
Occupational Therapy Treatment Patient Details Name: Jose Bush MRN: [id number] DOB: 04/19/1941 Today's Date: 04/07/2018    History of present illness Mr. Jose Bush is a 77 y.o. 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 [facility] and progressing well and developed redness, swelling, and pain in the stump. He was admitted with cellulitis. 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   OT comments  Patient is progressing well.  Limited by safety awareness during toileting urgency, requiring cueing for wc placement during stand pivot transfers and cueing for safety with pivoting prior to clothing mgmt during toileting (clothing falls to floor making fall hazard).  Fatigues easily with sustained activities, educated on safety and WC mobility use vs RW use for energy conservation.  Patient agreeable with recommendations.  Patient will benefit from continued OT services while admitted and after discharge to maximize independence and safety with ADLs. Will continue to follow while admitted.    Follow Up Recommendations  Home health OT;Supervision/Assistance - 24 hour    Equipment Recommendations  None recommended by OT    Recommendations for Other Services      Precautions / Restrictions Precautions Precautions: Fall Restrictions Weight Bearing Restrictions: Yes RLE Weight Bearing: Non weight bearing       Mobility Bed Mobility               General bed mobility comments: OOB in wheelchair upon arrival   Transfers Overall transfer level: Needs assistance Equipment used: Rolling walker (2 wheeled) Transfers: Sit to/from BJ'sStand;Stand Pivot Transfers Sit to Stand: Min guard Stand pivot transfers: Min guard            Balance Overall balance assessment: Needs assistance Sitting-balance support: No upper extremity supported;Feet supported Sitting balance-Leahy Scale:  Good   Postural control: Right lateral lean Standing balance support: During functional activity;Single extremity supported Standing balance-Leahy Scale: Poor Standing balance comment: Relies on at lease single UE support.                            ADL either performed or assessed with clinical judgement   ADL Overall ADL's : Needs assistance/impaired     Grooming: Set up;Sitting;Wash/dry hands               Lower Body Dressing: Min guard;Sit to/from stand Lower Body Dressing Details (indicate cue type and reason): threading shorts seating, steadying assist to pull up over hips in standing using 1 handed technique with grabbars  Toilet Transfer: Ambulation;Stand-pivot;RW;Grab bars;Comfort height toilet;Cueing for safety;Min guard(x2, 1x with ambulation and 2x with stand pivot grabbars ) Toilet Transfer Details (indicate cue type and reason): cueing for hand placement and safety, attempting to stand pivot with increased space between wc and commode due to urgency  Toileting- Clothing Manipulation and Hygiene: Min guard;Sit to/from stand Toileting - Clothing Manipulation Details (indicate cue type and reason): steady assist for safety, 1 handed technique using grabbars    Tub/Shower Transfer Details (indicate cue type and reason): verbally reviewed, but needs practice Functional mobility during ADLs: Rolling walker;Min guard;Wheelchair General ADL Comments: reviewed energy conservation and safety, patient preference to use wc for bathroom mobility     Vision       Perception     Praxis      Cognition Arousal/Alertness: Awake/alert Behavior During Therapy: WFL for tasks assessed/performed Overall Cognitive Status: Within Functional Limits for tasks assessed  Exercises Exercises: Amputee;Other exercises Other Exercises Other Exercises: Pt fatigued from wheelchair mobility in hall, so verbally reviewed  HEP with handout given. Pt practiced a few of each. Exercises given: quad sets, hip abd/add, LAQ, chair push up, hip extension in supine. Medbridge access code: [email or web address]   Shoulder Instructions       General Comments      Pertinent Vitals/ Pain       Pain Assessment: Faces Faces Pain Scale: Hurts a little bit Pain Location: R residual limb.  Pain Descriptors / Indicators: Discomfort;Sore Pain Intervention(s): Monitored during session  Home Living                                          Prior Functioning/Environment              Frequency  Min 2X/week        Progress Toward Goals  OT Goals(current goals can now be found in the care plan section)  Progress towards OT goals: Progressing toward goals  Acute Rehab OT Goals Patient Stated Goal: return home OT Goal Formulation: With patient Time For Goal Achievement: 04/19/18 Potential to Achieve Goals: Good  Plan Discharge plan remains appropriate;Frequency remains appropriate    Co-evaluation                 AM-PAC PT "6 Clicks" Daily Activity     Outcome Measure   Help from another person eating meals?: None Help from another person taking care of personal grooming?: A Little Help from another person toileting, which includes using toliet, bedpan, or urinal?: A Little Help from another person bathing (including washing, rinsing, drying)?: A Little Help from another person to put on and taking off regular upper body clothing?: None Help from another person to put on and taking off regular lower body clothing?: A Little 6 Click Score: 20    End of Session Equipment Utilized During Treatment: Gait belt;Rolling walker;Other (comment)(WC)  OT Visit Diagnosis: Other abnormalities of gait and mobility (R26.89);Muscle weakness (generalized) (M62.81)   Activity Tolerance Patient tolerated treatment well   Patient Left in chair;with call bell/phone within reach   Nurse Communication  Mobility status        Time: 4098-1191 OT Time Calculation (min): 32 min  Charges: OT General Charges $OT Visit: 1 Visit OT Treatments $Self Care/Home Management : 23-37 mins  Chancy Milroy, OTR/L  Pager [phone]    Chancy Milroy 04/07/2018, 3:35 PM

## 2018-04-07 NOTE — Progress Notes (Signed)
[facility]  Subjective  - Less pain and erythema, no fever or chills per patient.   Objective (!) 154/70 83 98.7 F (37.1 C) (Oral) 16 99%  Intake/Output Summary (Last 24 hours) at 04/07/2018 0836 Last data filed at 04/06/2018 0900 Gross per 24 hour  Intake -  Output 800 ml  Net -800 ml   Photo 04/07/2018       Admission Photo 04/02/2018       Mosetta PigeonEmma Maureen Cayden Rautio PA-C

## 2018-04-08 LAB — CBC
HCT: 29.8 % — ABNORMAL LOW (ref 39.0–52.0)
HEMOGLOBIN: 9.3 g/dL — AB (ref 13.0–17.0)
MCH: 28 pg (ref 26.0–34.0)
MCHC: 31.2 g/dL (ref 30.0–36.0)
MCV: 89.8 fL (ref 78.0–100.0)
Platelets: 227 10*3/uL (ref 150–400)
RBC: 3.32 MIL/uL — AB (ref 4.22–5.81)
RDW: 14.4 % (ref 11.5–15.5)
WBC: 5.4 10*3/uL (ref 4.0–10.5)

## 2018-04-08 LAB — PROTIME-INR
INR: 1.32
PROTHROMBIN TIME: 16.3 s — AB (ref 11.4–15.2)

## 2018-04-08 LAB — HEPARIN LEVEL (UNFRACTIONATED): Heparin Unfractionated: 0.34 IU/mL (ref 0.30–0.70)

## 2018-04-08 MED ORDER — WARFARIN SODIUM 7.5 MG PO TABS
7.5000 mg | ORAL_TABLET | Freq: Once | ORAL | Status: AC
  Administered 2018-04-08: 7.5 mg via ORAL
  Filled 2018-04-08: qty 1

## 2018-04-08 MED ORDER — WARFARIN - PHARMACIST DOSING INPATIENT: Freq: Every day | Status: DC

## 2018-04-08 NOTE — Care Management (Signed)
Case manager continues to follow patient for disposition. He will be on IV antibiotics until Saturday 04/12/18. Home Health is arranged with [facility].

## 2018-04-08 NOTE — Progress Notes (Signed)
Physical Therapy Treatment Patient Details Name: Jose Bush MRN: [id number] DOB: 1941/02/23 Today's Date: 04/08/2018    History of Present Illness Mr. Jose FickBanks is a 77 y.o. 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 [facility] and progressing well and developed redness, swelling, and pain in the stump. He was admitted with cellulitis. 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    PT Comments    Pt progressing well towards physical therapy goals. Was able to demonstrate improved wheelchair mobility in hall this session with increased speed and control of the w/c. VC's provided for steering and increased use of LLE to assist with propulsion. Pt reports he is performing his HEP independently between therapy sessions. Will continue to follow and progress as able per POC.    Follow Up Recommendations  Home health PT;Supervision/Assistance - 24 hour     Equipment Recommendations  None recommended by PT;Other (comment)(pt has all necessary DME at home)    Recommendations for Other Services       Precautions / Restrictions Precautions Precautions: Fall Restrictions Weight Bearing Restrictions: Yes RLE Weight Bearing: Non weight bearing    Mobility  Bed Mobility               General bed mobility comments: OOB in w/c upon arrival  Transfers                    Ambulation/Gait                 Dance movement psychotherapisttairs             Wheelchair Mobility Wheelchair Mobility Wheelchair mobility: Yes Wheelchair propulsion: Both upper extremities;Left lower extremity Wheelchair parts: Supervision/cueing Distance: 250 Wheelchair Assistance Details (indicate cue type and reason): Slow propulsion speed and required occasional assist to steer. 1 rest break prior to return to room.   Modified Rankin (Stroke Patients Only)       Balance Overall balance assessment: Needs  assistance Sitting-balance support: No upper extremity supported;Feet supported Sitting balance-Leahy Scale: Good Sitting balance - Comments: Static sitting stable but requires min guard to min assist for dynamic activities Postural control: Right lateral lean Standing balance support: During functional activity;Single extremity supported Standing balance-Leahy Scale: Fair Standing balance comment: Relies on at lease single UE support.                             Cognition Arousal/Alertness: Awake/alert Behavior During Therapy: WFL for tasks assessed/performed Overall Cognitive Status: Within Functional Limits for tasks assessed                                        Exercises Other Exercises Other Exercises: Pt reports performing HEP on his own so deferred this session.     General Comments        Pertinent Vitals/Pain Pain Assessment: Faces Faces Pain Scale: Hurts a little bit Pain Location: R residual limb.  Pain Descriptors / Indicators: Discomfort;Sore Pain Intervention(s): Monitored during session    Home Living                      Prior Function            PT Goals (current goals can now be found in the care plan section)  Acute Rehab PT Goals Patient Stated Goal: return home PT Goal Formulation: With patient Time For Goal Achievement: 04/19/18 Potential to Achieve Goals: Good Progress towards PT goals: Progressing toward goals    Frequency    Min 3X/week      PT Plan Current plan remains appropriate    Co-evaluation              AM-PAC PT "6 Clicks" Daily Activity  Outcome Measure  Difficulty turning over in bed (including adjusting bedclothes, sheets and blankets)?: A Little Difficulty moving from lying on back to sitting on the side of the bed? : A Little Difficulty sitting down on and standing up from a chair with arms (e.g., wheelchair, bedside commode, etc,.)?: A Lot Help needed moving to and from a  bed to chair (including a wheelchair)?: A Little Help needed walking in hospital room?: A Little Help needed climbing 3-5 steps with a railing? : Total 6 Click Score: 15    End of Session Equipment Utilized During Treatment: Gait belt Activity Tolerance: Patient tolerated treatment well Patient left: with call bell/phone within reach;Other (comment)(in wheelchair) Nurse Communication: Mobility status PT Visit Diagnosis: Other abnormalities of gait and mobility (R26.89);Difficulty in walking, not elsewhere classified (R26.2) Pain - Right/Left: Right Pain - part of body: Leg     Time: 1610-9604 PT Time Calculation (min) (ACUTE ONLY): 18 min  Charges:  $Therapeutic Activity: 8-22 mins                    G Codes:       Jose Bush, PT, DPT Acute Rehabilitation Services Pager: [phone]    Jose Bush 04/08/2018, 2:24 PM

## 2018-04-08 NOTE — Progress Notes (Signed)
[facility]  Subjective  - feels ok   Objective 114/71 73 98.2 F (36.8 C) (Oral) 18 98%  Intake/Output Summary (Last 24 hours) at 04/08/2018 0831 Last data filed at 04/07/2018 2100 Gross per 24 hour  Intake 220 ml  Output -  Net 220 ml   Right BKA still with some edema, erythema continues to resolve Still some serous drainage Some black eschar at both corners  Assessment/Planning: Continue IV antibiotics Dry dressing once daily Most likely d/c Friday with home oral antibiotics Continue resuming coumadin  Fabienne BrunsCharles Bush 04/08/2018 8:31 AM --  Laboratory Lab Results: Recent Labs    04/07/18 0445 04/08/18 0459  WBC 4.6 5.4  HGB 9.6* 9.3*  HCT 30.1* 29.8*  PLT 253 227   BMET Recent Labs    04/06/18 0451  NA 137  K 4.0  CL 106  CO2 25  GLUCOSE 91  BUN 10  CREATININE 0.69  CALCIUM 8.7*    COAG Lab Results  Component Value Date   INR 1.32 04/08/2018   INR 1.31 04/07/2018   INR 1.35 04/06/2018   No results found for: PTT

## 2018-04-08 NOTE — Progress Notes (Signed)
ANTICOAGULATION & ANTIBIOTIC CONSULT NOTE  Pharmacy Consult:  Heparin / Coumadin + Vancomycin Indication: Thrombophilia secondary to lupus + Cellulitis of BKA stump  No Known Allergies  Patient Measurements: Height: 5\' 8"  (172.7 cm) Weight: 174 lb (78.9 kg) IBW/kg (Calculated) : 68.4 Heparin Dosing Weight: 79 kg  Vital Signs: Temp: 98.2 F (36.8 C) (07/08 2225) Temp Source: Oral (07/08 2225) BP: 114/71 (07/08 2225) Pulse Rate: 73 (07/08 2225)  Labs: Recent Labs    04/06/18 0451 04/07/18 0445 04/07/18 1240 04/08/18 0459  HGB 9.7* 9.6*  --  9.3*  HCT 31.0* 30.1*  --  29.8*  PLT 274 253  --  227  LABPROT 16.5* 16.2*  --  16.3*  INR 1.35 1.31  --  1.32  HEPARINUNFRC 0.30 0.16* 0.45 0.34  CREATININE 0.69  --   --   --     Estimated Creatinine Clearance: 74.8 mL/min (by C-G formula based on SCr of 0.69 mg/dL).   Assessment: [age] YOM presents on 04/02/18 with redness and swelling of stump. Patient was on Coumadin PTA for thrombophilia due to to lupus, held for possible surgery and IV heparin bridge started.  Coumadin resumed by Vascular on 04/05/18 and then per Pharmacy on 04/08/18.  Heparin level is therapeutic and INR is sub-therapeutic.  No bleeding reported.  Pharmacy also dosing vancomycin for cellulitis of recent BKA stump.  Renal function is stable, afebrile, WBC WNL.  Vanc 7/3 >> Zosyn 7/3 x1  7/6 VT = 12 on 750mg  q12 >> no change  7/3 BCx - NGTD   Goal of Therapy:  Heparin level 0.3-0.7 units/ml Monitor platelets by anticoagulation protocol: Yes  Vanc trough 10-15 mcg/mL    Plan:  Increase heparin gtt slightly to 1700 units/hr Coumadin 7.5mg  PO today Daily heparin level, PT / INR and CBC  Continue vanc 750mg  IV Q12H Monitor renal fxn, clinical progress, weekly vanc trough if therapy exceeds 10 days   Isabel Ardila D. Laney Potashang, PharmD, BCPS, BCCCP 04/08/2018, 9:32 AM

## 2018-04-08 NOTE — Progress Notes (Signed)
Occupational Therapy Treatment Patient Details Name: Clearnce HastenWilliam L Damman MRN: [id number] DOB: 07/23/1941 Today's Date: 04/08/2018    History of present illness [name] is a [age] y.o. 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 He was [facility] and progressing well and developed redness, swelling, and pain in the stump. He was admitted with cellulitis. 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   OT comments  Patient progressing well.  Demonstrating ability to complete stand pivot to comfort height toilet using grab bars with min guard assistance and simulated shower transfer to 3:1 stand pivot with min assist for balance.  Patient requires increased time and minimal verbal cueing for safe wc positioning, sequencing and hand placement; but physically does not need assistance. He will benefit from continued OT services acutely in order to maximize safety and independence with ADL participation. Plan for next session, transfer training with spouse to ensure she is aware of safe setup from w/c. Will continue to follow while admitted.    Follow Up Recommendations  Home health OT;Supervision/Assistance - 24 hour    Equipment Recommendations  None recommended by OT    Recommendations for Other Services      Precautions / Restrictions Precautions Precautions: Fall Restrictions Weight Bearing Restrictions: Yes RLE Weight Bearing: Non weight bearing       Mobility Bed Mobility               General bed mobility comments: OOB in wc upon arrival  Transfers Overall transfer level: Needs assistance Equipment used: Rolling walker (2 wheeled) Transfers: Sit to/from [employer]GI CorporationStand;Stand Pivot Transfers Sit to Stand: Min guard Stand pivot transfers: Min guard       General transfer comment: for safety, cueing required for sequencing/positioning     Balance Overall balance assessment: Needs  assistance Sitting-balance support: No upper extremity supported;Feet supported Sitting balance-Leahy Scale: Good     Standing balance support: During functional activity;Single extremity supported Standing balance-Leahy Scale: Fair Standing balance comment: Relies on at lease single UE support.                            ADL either performed or assessed with clinical judgement   ADL Overall ADL's : Needs assistance/impaired               Lower Body Bathing Details (indicate cue type and reason): reviewed safety bathing seated with spouse support         Toilet Transfer: Min guard;Cueing for sequencing;Cueing for safety;Stand-pivot;Grab bars;Comfort height toilet Toilet Transfer Details (indicate cue type and reason): cueing for hand placement, wc positioning and safety; used grabbars for stand pivot      Tub/ Shower Transfer: Walk-in shower;Minimal assistance;Stand-pivot;Cueing for sequencing;Cueing for safety;3 in 1(wc ) Web designerTub/Shower Transfer Details (indicate cue type and reason): stand pivot from wc to 3:1, cueing for technique and safety; positioning wc at ledge of shower to safely pivot into shower  Functional mobility during ADLs: Wheelchair General ADL Comments: increased time for wc mobility and problem sovling through positioning for increased safety      Vision       Perception     Praxis      Cognition Arousal/Alertness: Awake/alert Behavior During Therapy: WFL for tasks assessed/performed Overall Cognitive Status: Within Functional Limits for tasks assessed  General Comments: increased time to problem solve through positioning wc for sfae transfers        Exercises     Shoulder Instructions       General Comments      Pertinent Vitals/ Pain       Pain Assessment: Faces Faces Pain Scale: Hurts a little bit Pain Location: R residual limb.  Pain Descriptors / Indicators:  Discomfort;Sore Pain Intervention(s): Monitored during session  Home Living                                          Prior Functioning/Environment              Frequency  Min 2X/week        Progress Toward Goals  OT Goals(current goals can now be found in the care plan section)  Progress towards OT goals: Progressing toward goals  Acute Rehab OT Goals Patient Stated Goal: return home OT Goal Formulation: With patient Time For Goal Achievement: 04/19/18 Potential to Achieve Goals: Good  Plan Discharge plan remains appropriate;Frequency remains appropriate    Co-evaluation                 AM-PAC PT "6 Clicks" Daily Activity     Outcome Measure   Help from another person eating meals?: None Help from another person taking care of personal grooming?: A Little Help from another person toileting, which includes using toliet, bedpan, or urinal?: A Little Help from another person bathing (including washing, rinsing, drying)?: A Little Help from another person to put on and taking off regular upper body clothing?: None Help from another person to put on and taking off regular lower body clothing?: A Little 6 Click Score: 20    End of Session Equipment Utilized During Treatment: Gait belt;Rolling walker;Other (comment)(WC)  OT Visit Diagnosis: Other abnormalities of gait and mobility (R26.89);Muscle weakness (generalized) (M62.81);Pain Pain - Right/Left: Right Pain - part of body: Leg(residual limb)   Activity Tolerance Patient tolerated treatment well   Patient Left in chair;with call bell/phone within reach   Nurse Communication Mobility status        Time: 1610-9604 OT Time Calculation (min): 23 min  Charges: OT General Charges $OT Visit: 1 Visit OT Treatments $Self Care/Home Management : 8-22 mins  Chancy Milroy, OTR/L  Pager [phone]    Chancy Milroy 04/08/2018, 9:36 AM

## 2018-04-08 NOTE — Discharge Instructions (Signed)

## 2018-04-08 NOTE — Care Management Note (Signed)
Case Management Note  Patient Details  Name: Jose Bush MRN: [id number] Date of Birth: [birth year]  Subjective/Objective:   77 yr old gentleman readmitted with cellulitis of right BKA, surgery was performed 03/18/18.                  Action/Plan: Patient will receive IV antibiotics until Saturday 04/12/18 and will then be reevaluated by surgeon. Case manager spoke with patient and wife concerning discharge plans and DME needs for future discharge. Patient's wife says they will use [facility], he has 3in1, will need wheelchair. CM called referral to Tiajuana AmassJermain Jenkins, [facility]. Patient will have family support when discharged.     Expected Discharge Date:   pending               Expected Discharge Plan:  Home w Home Health Services  In-House Referral:     Discharge planning Services  CM Consult  Post Acute Care Choice:  Durable Medical Equipment, Home Health Choice offered to:  Patient, Spouse  DME Arranged:  Wheelchair manual DME Agency:  [facility]  [facility]  [facility]  Status of Service:  In process, will continue to follow  If discussed at Long Length of Stay Meetings, dates discussed:    Additional Comments:  Jose Bush, Jose Mckesson Naomi, RN 04/08/2018, 10:47 AM

## 2018-04-08 NOTE — Progress Notes (Signed)
Physical Therapy Progress Note  Patient is s/p right transtibial amputation now with cellulitis which impairs their ability to perform daily activities like ambulating in the home.  A walker alone will not resolve the issues with performing activities of daily living. A wheelchair will allow patient to safely perform daily activities.  The patient can self propel in the home or has a caregiver who can provide assistance.  Necessary accessories include a wheelchair cushion and elevating leg rests.     Conni SlipperLaura Meagan Spease, PT, DPT Acute Rehabilitation Services Pager: [phone]

## 2018-04-09 LAB — BASIC METABOLIC PANEL
Anion gap: 11 (ref 5–15)
BUN: 8 mg/dL (ref 8–23)
CALCIUM: 9.1 mg/dL (ref 8.9–10.3)
CO2: 25 mmol/L (ref 22–32)
CREATININE: 0.65 mg/dL (ref 0.61–1.24)
Chloride: 101 mmol/L (ref 98–111)
GFR calc non Af Amer: >60 mL/min (ref >60)
Glucose, Bld: 93 mg/dL (ref 70–99)
Potassium: 3.8 mmol/L (ref 3.5–5.1)
SODIUM: 137 mmol/L (ref 135–145)

## 2018-04-09 LAB — PROTIME-INR
INR: 1.17
PROTHROMBIN TIME: 14.8 s (ref 11.4–15.2)

## 2018-04-09 LAB — CBC
HCT: 31.3 % — ABNORMAL LOW (ref 39.0–52.0)
HEMOGLOBIN: 9.8 g/dL — AB (ref 13.0–17.0)
MCH: 27.8 pg (ref 26.0–34.0)
MCHC: 31.3 g/dL (ref 30.0–36.0)
MCV: 88.9 fL (ref 78.0–100.0)
Platelets: 242 10*3/uL (ref 150–400)
RBC: 3.52 MIL/uL — AB (ref 4.22–5.81)
RDW: 14.6 % (ref 11.5–15.5)
WBC: 5.4 10*3/uL (ref 4.0–10.5)

## 2018-04-09 LAB — HEPARIN LEVEL (UNFRACTIONATED): Heparin Unfractionated: 0.34 IU/mL (ref 0.30–0.70)

## 2018-04-09 MED ORDER — WARFARIN SODIUM 7.5 MG PO TABS
7.5000 mg | ORAL_TABLET | Freq: Once | ORAL | Status: AC
  Administered 2018-04-09: 7.5 mg via ORAL
  Filled 2018-04-09: qty 1

## 2018-04-09 NOTE — Progress Notes (Signed)
Physical Therapy Treatment Patient Details Name: Jose Bush MRN: [id number] DOB: 13-Apr-1941 Today's Date: 04/09/2018    History of Present Illness Jose Bush is a 77 y.o. 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 He was participating in rehabilitation at [facility] and progressing well and developed redness, swelling, and pain in the stump. He was admitted with cellulitis. 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    PT Comments    Focus of session was gait training. Pt was able to ambulate fairly well with RW for support and frequent rest breaks. Pt demonstrating good control of swing through and improved standing balance. Next session will focus on stair training for d/c. Will continue to follow and progress as able per POC.    Follow Up Recommendations  Home health PT;Supervision/Assistance - 24 hour     Equipment Recommendations  None recommended by PT;Other (comment)(pt has all necessary DME at home)    Recommendations for Other Services       Precautions / Restrictions Precautions Precautions: Fall Restrictions Weight Bearing Restrictions: Yes RLE Weight Bearing: Non weight bearing    Mobility  Bed Mobility               General bed mobility comments: OOB in w/c upon arrival  Transfers Overall transfer level: Needs assistance Equipment used: Rolling walker (2 wheeled) Transfers: Sit to/from Stand Sit to Stand: Min guard         General transfer comment: for safety, cueing required for sequencing/positioning   Ambulation/Gait Ambulation/Gait assistance: Min guard Gait Distance (Feet): 200 Feet Assistive device: Rolling walker (2 wheeled) Gait Pattern/deviations: Step-to pattern(hop-to on L LE) Gait velocity: decreased Gait velocity interpretation: <1.31 ft/sec, indicative of household ambulator General Gait Details: pt steady with RW and hop-to pattern on L LE with w/c following. Pt  with several standing and seated rest breaks throughout but overall tolerated well.    Stairs             Wheelchair Mobility    Modified Rankin (Stroke Patients Only)       Balance Overall balance assessment: Needs assistance Sitting-balance support: No upper extremity supported;Feet supported Sitting balance-Leahy Scale: Good Sitting balance - Comments: Static sitting stable but requires min guard to min assist for dynamic activities Postural control: Right lateral lean Standing balance support: During functional activity;Single extremity supported Standing balance-Leahy Scale: Fair Standing balance comment: Relies on at least single UE support.                             Cognition Arousal/Alertness: Awake/alert Behavior During Therapy: WFL for tasks assessed/performed Overall Cognitive Status: Within Functional Limits for tasks assessed                                        Exercises Other Exercises Other Exercises: Pt reports performing HEP on his own so deferred this session.     General Comments        Pertinent Vitals/Pain Pain Assessment: Faces Faces Pain Scale: Hurts a little bit Pain Location: R residual limb.  Pain Descriptors / Indicators: Discomfort;Sore Pain Intervention(s): Monitored during session    Home Living Family/patient expects to be discharged to:: Private residence Living Arrangements: Spouse/significant other Available Help at Discharge: Family;Available 24 hours/day Type of Home: House Home Access: Ramped entrance  Entrance Stairs-Rails: Right;Left Home Layout: One level Home Equipment: Walker - 2 wheels;Cane - single point;Shower seat;Bedside commode      Prior Function Level of Independence: Needs assistance  Gait / Transfers Assistance Ne[facility] with RW and wheelchair.  ADL's / Homemaking Assistance Needed: Reports that he had returned to independence with dressing and  bathing at w/c level.  Comments: Using rolling walker   PT Goals (current goals can now be found in the care plan section) Acute Rehab PT Goals Patient Stated Goal: return home PT Goal Formulation: With patient Time For Goal Achievement: 04/19/18 Potential to Achieve Goals: Good Progress towards PT goals: Progressing toward goals    Frequency    Min 3X/week      PT Plan Current plan remains appropriate    Co-evaluation              AM-PAC PT "6 Clicks" Daily Activity  Outcome Measure  Difficulty turning over in bed (including adjusting bedclothes, sheets and blankets)?: A Little Difficulty moving from lying on back to sitting on the side of the bed? : A Little Difficulty sitting down on and standing up from a chair with arms (e.g., wheelchair, bedside commode, etc,.)?: A Lot Help needed moving to and from a bed to chair (including a wheelchair)?: A Little Help needed walking in hospital room?: A Little Help needed climbing 3-5 steps with a railing? : Total 6 Click Score: 15    End of Session Equipment Utilized During Treatment: Gait belt Activity Tolerance: Patient tolerated treatment well Patient left: with call bell/phone within reach;with family/visitor present;Other (comment)(in wheelchair) Nurse Communication: Mobility status PT Visit Diagnosis: Other abnormalities of gait and mobility (R26.89);Difficulty in walking, not elsewhere classified (R26.2) Pain - Right/Left: Right Pain - part of body: Leg     Time: [id number] PT Time Calculation (min) (ACUTE ONLY): 17 min  Charges:  $Gait Training: 8-22 mins                    G Codes:       Conni SlipperLaura Derya Dettmann, PT, DPT Acute Rehabilitation Services Pager: [phone]    Marylynn PearsonLaura D Wladyslaw Henrichs 04/09/2018, 3:44 PM

## 2018-04-09 NOTE — Consult Note (Signed)
   [facility] CM Inpatient Consult   04/09/2018  Jose Bush 1941/07/13 [id number]  Patient screened for re-admission and Medium risk score for unplanned readmissions in [facility] services. Patient is in the [place] of the [facility] services under patient's [facility] plan.  Met with the patient and his wife, Enid Derry at the bedside regarding [place] [facility] and benefits with community follow up for care management/resources and disease management. Wife states that they have chosen [facility] for home health.  Explained the differences between care management and home health care. A brochure and 24 hour nurse advise line was given.  Wife states she will read information and gave contact information for additional questions or needs. Most of the concerns were for his anticoagulants and hoping to change to Eliquis. Encouraged to call for questions.  They expressed wanting to see how home health does.  Explained they could self refer at any time.  For questions contact:   Natividad Brood, RN BSN [facility] Liaison  [phone] business mobile phone Toll free office [phone]

## 2018-04-09 NOTE — Progress Notes (Signed)
ANTICOAGULATION CONSULT NOTE  Pharmacy Consult:  Heparin / Coumadin Indication: Thrombophilia secondary to lupus  No Known Allergies  Patient Measurements: Height: 5\' 8"  (172.7 cm) Weight: 174 lb (78.9 kg) IBW/kg (Calculated) : 68.4 Heparin Dosing Weight: 79 kg  Vital Signs: Temp: 98.2 F (36.8 C) (07/10 0420) Temp Source: Oral (07/10 0420) BP: 130/75 (07/10 0420) Pulse Rate: 80 (07/10 0420)  Labs: Recent Labs    04/07/18 0445 04/07/18 1240 04/08/18 0459 04/09/18 0425  HGB 9.6*  --  9.3* 9.8*  HCT 30.1*  --  29.8* 31.3*  PLT 253  --  227 242  LABPROT 16.2*  --  16.3* 14.8  INR 1.31  --  1.32 1.17  HEPARINUNFRC 0.16* 0.45 0.34 0.34  CREATININE  --   --   --  0.65    Estimated Creatinine Clearance: 74.8 mL/min (by C-G formula based on SCr of 0.65 mg/dL).   Assessment: [age] YOM presents on 04/02/18 with redness and swelling of stump. Patient was on Coumadin PTA for thrombophilia due to to lupus, held for possible surgery and IV heparin bridge started.  Coumadin resumed by Vascular on 04/05/18 and then per Pharmacy on 04/08/18.  Heparin level is therapeutic.  INR is sub-therapeutic and trended down because patient missed his dose on 04/07/18.  No bleeding reported.  Home Coumadin dose:  5mg  PO daily   Goal of Therapy:  Heparin level 0.3-0.7 units/ml Monitor platelets by anticoagulation protocol: Yes    Plan:  Continue heparin gtt at 1700 units/hr Coumadin 7.5mg  PO today Daily heparin level, PT / INR and CBC   Anselmo Reihl D. [facility] Potashang, PharmD, BCPS, BCCCP 04/09/2018, 12:31 PM

## 2018-04-09 NOTE — Progress Notes (Addendum)
  Progress Note    04/09/2018 12:13 PM  Subjective:  No new complaints   Vitals:   04/08/18 2034 04/09/18 0420  BP: 130/80 130/75  Pulse: 79 80  Resp: 16 16  Temp: 97.7 F (36.5 C) 98.2 F (36.8 C)  SpO2: 98% 97%   Physical Exam: Lungs:  Non labored Incisions:  Necrotic tissue lateral incision; sanguinous drainage on drainage lateral and medial aspects of the incision Extremities:  Dry cracking skin R BKA; minimal erythema Neurologic: A&O  CBC    Component Value Date/Time   WBC 5.4 04/09/2018 0425   RBC 3.52 (L) 04/09/2018 0425   HGB 9.8 (L) 04/09/2018 0425   HCT 31.3 (L) 04/09/2018 0425   PLT 242 04/09/2018 0425   MCV 88.9 04/09/2018 0425   MCH 27.8 04/09/2018 0425   MCHC 31.3 04/09/2018 0425   RDW 14.6 04/09/2018 0425   LYMPHSABS 1.4 04/02/2018 1533   MONOABS 0.8 04/02/2018 1533   EOSABS 0.6 04/02/2018 1533   BASOSABS 0.0 04/02/2018 1533    BMET    Component Value Date/Time   NA 137 04/09/2018 0425   K 3.8 04/09/2018 0425   CL 101 04/09/2018 0425   CO2 25 04/09/2018 0425   GLUCOSE 93 04/09/2018 0425   BUN 8 04/09/2018 0425   CREATININE 0.65 04/09/2018 0425   CALCIUM 9.1 04/09/2018 0425   GFRNONAA >60 04/09/2018 0425   GFRAA >60 04/09/2018 0425    INR    Component Value Date/Time   INR 1.17 04/09/2018 0425     Intake/Output Summary (Last 24 hours) at 04/09/2018 1213 Last data filed at 04/09/2018 0900 Gross per 24 hour  Intake 960 ml  Output -  Net 960 ml     Assessment/Plan:  77 y.o. male is s/p R BKA with subsequent cellulitis  R BKA unchanged based on chart review Pharmacy bridging to coumadin Dressing changed today Continue antibiotics; plan is to d/c on p.o. Antibiotics Friday  Emilie RutterMatthew Eveland, PA-C [facility] [phone] 04/09/2018 12:13 PM  BKA vastly improved from last week, lateral corner still tenuous but better.  Most likely d/c home Friday on oral antibiotics.  Fabienne Brunsharles Krisann Mckenna, MD [facility] [place] Office: [phone] Pager: [phone]

## 2018-04-10 ENCOUNTER — Encounter: Payer: Medicare HMO | Admitting: Vascular Surgery

## 2018-04-10 LAB — CBC
HEMATOCRIT: 30.9 % — AB (ref 39.0–52.0)
HEMOGLOBIN: 9.6 g/dL — AB (ref 13.0–17.0)
MCH: 28.2 pg (ref 26.0–34.0)
MCHC: 31.1 g/dL (ref 30.0–36.0)
MCV: 90.6 fL (ref 78.0–100.0)
Platelets: 216 10*3/uL (ref 150–400)
RBC: 3.41 MIL/uL — AB (ref 4.22–5.81)
RDW: 14.9 % (ref 11.5–15.5)
WBC: 5.5 10*3/uL (ref 4.0–10.5)

## 2018-04-10 LAB — HEPARIN LEVEL (UNFRACTIONATED): Heparin Unfractionated: 0.36 IU/mL (ref 0.30–0.70)

## 2018-04-10 LAB — PROTIME-INR
INR: 1.24
Prothrombin Time: 15.5 seconds — ABNORMAL HIGH (ref 11.4–15.2)

## 2018-04-10 MED ORDER — WARFARIN SODIUM 7.5 MG PO TABS
7.5000 mg | ORAL_TABLET | Freq: Once | ORAL | Status: AC
  Administered 2018-04-10: 7.5 mg via ORAL
  Filled 2018-04-10: qty 1

## 2018-04-10 NOTE — Progress Notes (Signed)
Physical Therapy Treatment Patient Details Name: Jose Bush MRN: [id number] DOB: 1940-10-28 Today's Date: 04/10/2018    History of Present Illness Mr. Jose Bush is a 77 y.o. 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 [facility] and progressing well and developed redness, swelling, and pain in the stump. He was admitted with cellulitis. 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    PT Comments    Pt progressing well with mobility. Pt and wife anticipate d/c home tomorrow morning, and do not report any further questions. Pt practiced simulated car transfer this session and was able to manage well without assist. Overall pt is safe to return home with wife's support and home health PT. Will continue to follow until d/c but pt does not require another PT session prior to d/c.   Follow Up Recommendations  Home health PT;Supervision/Assistance - 24 hour     Equipment Recommendations  None recommended by PT;Other (comment)(pt has all necessary DME at home)    Recommendations for Other Services       Precautions / Restrictions Precautions Precautions: Fall Restrictions Weight Bearing Restrictions: Yes RLE Weight Bearing: Non weight bearing    Mobility  Bed Mobility                  Transfers Overall transfer level: Needs assistance Equipment used: Rolling walker (2 wheeled) Transfers: Sit to/from Stand Sit to Stand: Supervision         General transfer comment: Close supervision for safety. Pt demonstrating proper hand placement on seated surface. Increased time to ensure safety.  Ambulation/Gait Ambulation/Gait assistance: Min guard Gait Distance (Feet): 30 Feet Assistive device: Rolling walker (2 wheeled) Gait Pattern/deviations: Step-to pattern(hop-to on L LE) Gait velocity: decreased Gait velocity interpretation: <1.31 ft/sec, indicative of household  ambulator General Gait Details: pt steady with RW and hop-to pattern on L LE.   Stairs Stairs: Yes       General stair comments: Attempted backwards negotiation onto a small step. Pt was not able to clear step with x2 attempts, and activity was ended for safety. Pt has a ramp to enter home, but was willing to attempt stair training.    Merchant navy officerWheelchair Mobility Wheelchair Mobility Wheelchair mobility: Yes Wheelchair propulsion: Both upper extremities;Left lower extremity Wheelchair parts: Supervision/cueing Distance: 250 Wheelchair Assistance Details (indicate cue type and reason): Slow propulsion speed and required occasional assist to steer.  Modified Rankin (Stroke Patients Only)       Balance Overall balance assessment: Needs assistance Sitting-balance support: No upper extremity supported;Feet supported Sitting balance-Leahy Scale: Good Sitting balance - Comments: Static sitting stable but requires min guard to min assist for dynamic activities Postural control: Right lateral lean Standing balance support: During functional activity;Single extremity supported Standing balance-Leahy Scale: Fair Standing balance comment: Relies on UE support.                             Cognition Arousal/Alertness: Awake/alert Behavior During Therapy: WFL for tasks assessed/performed Overall Cognitive Status: Within Functional Limits for tasks assessed                                        Exercises Other Exercises Other Exercises: Pt reports performing HEP on his own so deferred this session.     General Comments  Pertinent Vitals/Pain Pain Assessment: Faces Faces Pain Scale: Hurts a little bit Pain Location: R residual limb.  Pain Descriptors / Indicators: Discomfort Pain Intervention(s): Monitored during session    Home Living                      Prior Function            PT Goals (current goals can now be found in the care  plan section) Acute Rehab PT Goals Patient Stated Goal: return home PT Goal Formulation: With patient Time For Goal Achievement: 04/19/18 Potential to Achieve Goals: Good Progress towards PT goals: Progressing toward goals    Frequency    Min 3X/week      PT Plan Current plan remains appropriate    Co-evaluation              AM-PAC PT "6 Clicks" Daily Activity  Outcome Measure  Difficulty turning over in bed (including adjusting bedclothes, sheets and blankets)?: A Little Difficulty moving from lying on back to sitting on the side of the bed? : A Little Difficulty sitting down on and standing up from a chair with arms (e.g., wheelchair, bedside commode, etc,.)?: A Little Help needed moving to and from a bed to chair (including a wheelchair)?: A Little Help needed walking in hospital room?: A Little Help needed climbing 3-5 steps with a railing? : Total 6 Click Score: 16    End of Session Equipment Utilized During Treatment: Gait belt Activity Tolerance: Patient tolerated treatment well Patient left: with call bell/phone within reach;with family/visitor present;Other (comment)(in wheelchair) Nurse Communication: Mobility status PT Visit Diagnosis: Other abnormalities of gait and mobility (R26.89);Difficulty in walking, not elsewhere classified (R26.2) Pain - Right/Left: Right Pain - part of body: Leg     Time: 8657-8469 PT Time Calculation (min) (ACUTE ONLY): 27 min  Charges:  $Gait Training: 23-37 mins                    G Codes:       Conni Slipper, PT, DPT Acute Rehabilitation Services Pager: [phone]    Marylynn Pearson 04/10/2018, 2:37 PM

## 2018-04-10 NOTE — Progress Notes (Signed)
ANTICOAGULATION CONSULT NOTE  Pharmacy Consult:  Heparin / Coumadin Indication: Thrombophilia secondary to lupus  No Known Allergies  Patient Measurements: Height: 5\' 8"  (172.7 cm) Weight: 174 lb (78.9 kg) IBW/kg (Calculated) : 68.4 Heparin Dosing Weight: 79 kg  Vital Signs: Temp: 97.7 F (36.5 C) (07/11 0421) Temp Source: Oral (07/11 0421) BP: 130/67 (07/11 0421) Pulse Rate: 71 (07/11 0421)  Labs: Recent Labs    04/08/18 0459 04/09/18 0425 04/10/18 0345  HGB 9.3* 9.8* 9.6*  HCT 29.8* 31.3* 30.9*  PLT 227 242 216  LABPROT 16.3* 14.8 15.5*  INR 1.32 1.17 1.24  HEPARINUNFRC 0.34 0.34 0.36  CREATININE  --  0.65  --     Estimated Creatinine Clearance: 74.8 mL/min (by C-G formula based on SCr of 0.65 mg/dL).   Assessment: [age] YOM presents on 04/02/18 with redness and swelling of stump. Patient was on Coumadin PTA for thrombophilia due to to lupus, held for possible surgery and IV heparin bridge started.  Coumadin resumed by Vascular on 04/05/18 and then per Pharmacy on 04/08/18.   Heparin level is therapeutic and INR is starting to trend up.  No bleeding reported.  Home Coumadin dose:  5mg  PO daily   Goal of Therapy:  Heparin level 0.3-0.7 units/ml Monitor platelets by anticoagulation protocol: Yes    Plan:  Continue heparin gtt at 1700 units/hr Repeat Coumadin 7.5mg  PO today Daily heparin level, PT / INR and CBC   Pavel Gadd D. [facility]y Potashang, PharmD, BCPS, BCCCP 04/10/2018, 10:25 AM

## 2018-04-10 NOTE — Plan of Care (Signed)

## 2018-04-10 NOTE — Progress Notes (Signed)
[facility] [place]  Subjective  - feels ok   Objective 130/67 71 97.7 F (36.5 C) (Oral) 18 99%  Intake/Output Summary (Last 24 hours) at 04/10/2018 0800 Last data filed at 04/09/2018 1700 Gross per 24 hour  Intake 1440 ml  Output -  Net 1440 ml   Right BKA about the same still some drainage from corners but erythema essentially resolved  Assessment/Planning: Continue IV antibiotics today Transition to oral tomorrow and d/c home tomorrow  Jose BrunsCharles Gilmer Bush 04/10/2018 8:00 AM --  Laboratory Lab Results: Recent Labs    04/09/18 0425 04/10/18 0345  WBC 5.4 5.5  HGB 9.8* 9.6*  HCT 31.3* 30.9*  PLT 242 216   BMET Recent Labs    04/09/18 0425  NA 137  K 3.8  CL 101  CO2 25  GLUCOSE 93  BUN 8  CREATININE 0.65  CALCIUM 9.1    COAG Lab Results  Component Value Date   INR 1.24 04/10/2018   INR 1.17 04/09/2018   INR 1.32 04/08/2018   No results found for: PTT

## 2018-04-10 NOTE — Plan of Care (Signed)
  Problem: Pain Managment: Goal: General experience of comfort will improve 04/10/2018 0154 by Carylon PerchesBrown, Elizette Shek S, LPN Outcome: Progressing 04/10/2018 0153 by Carylon PerchesBrown, Shannon Kirkendall S, LPN Outcome: Progressing

## 2018-04-10 NOTE — Progress Notes (Signed)
Occupational Therapy Treatment Patient Details Name: Jose Bush MRN: [id number] DOB: 16-Jul-1941 Today's Date: 04/10/2018    History of present illness Jose Bush is a 77 y.o. 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 He was [facility] and progressing well and developed redness, swelling, and pain in the stump. He was admitted with cellulitis. 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   OT comments  Patient progressing well.  Continues to require minimal cueing for wc setup during toilet transfers, tends to lock w/c further away from commode when completing stand pivot transfer.  Demonstrating improving tolerance and standing balance, but uses 1 hand support in standing to ensure safety during self care tasks.  Continue to recommend 24/7 care at discharge and HHOT follow up, patient agreeable with recommendations. Will continue to follow while admitted.     Follow Up Recommendations  Home health OT;Supervision/Assistance - 24 hour    Equipment Recommendations  None recommended by OT    Recommendations for Other Services      Precautions / Restrictions Precautions Precautions: Fall Restrictions Weight Bearing Restrictions: Yes RLE Weight Bearing: Non weight bearing       Mobility Bed Mobility               General bed mobility comments: seated in wc upon therapist entering room   Transfers Overall transfer level: Needs assistance Equipment used: Rolling walker (2 wheeled) Transfers: Sit to/from Stand;Stand Pivot Transfers Sit to Stand: Supervision Stand pivot transfers: Min guard       General transfer comment: min guard to close supervision for safety, conitnues to require cueing for wc setup for commode transfer    Balance Overall balance assessment: Needs assistance Sitting-balance support: No upper extremity supported;Feet supported Sitting balance-Leahy  Scale: Good Sitting balance - Comments: Static sitting stable but requires min guard to min assist for dynamic activities Postural control: Right lateral lean Standing balance support: During functional activity;Single extremity supported Standing balance-Leahy Scale: Fair Standing balance comment: Relies on UE support.                            ADL either performed or assessed with clinical judgement   ADL Overall ADL's : Needs assistance/impaired     Grooming: Min guard;Standing;Cueing for safety;Cueing for compensatory techniques Grooming Details (indicate cue type and reason): educated to complete seated when possible, standing with leaning on sink counter during bimanual task     Lower Body Bathing: Min guard;Sit to/from stand Lower Body Bathing Details (indicate cue type and reason): complete bathing of peri/buttocks standing with 1 hand support     Lower Body Dressing: Min guard;Sit to/from stand Lower Body Dressing Details (indicate cue type and reason): threading shorts seated, steady assist for safety in standing completing 1 handed technique Toilet Transfer: Min guard;Stand-pivot;Grab bars(to/from wc, requires cueing for setup of wc and safety)             General ADL Comments: continues to require cueing for wc setup and safety for functional transfers      Vision       Perception     Praxis      Cognition Arousal/Alertness: Awake/alert Behavior During [facility]: [facility] for tasks assessed/performed Overall Cognitive Status: Within Functional Limits for tasks assessed  Exercises Other Exercises Other Exercises: Pt reports performing HEP on his own so deferred this session.    Shoulder Instructions       General Comments      Pertinent Vitals/ Pain       Pain Assessment: Faces Faces Pain Scale: Hurts a little bit Pain Location: R residual limb.  Pain Descriptors / Indicators:  Discomfort Pain Intervention(s): Monitored during session  Home Living                                          Prior Functioning/Environment              Frequency  Min 2X/week        Progress Toward Goals  OT Goals(current goals can now be found in the care plan section)  Progress towards OT goals: Progressing toward goals  Acute Rehab OT Goals Patient Stated Goal: return home OT Goal Formulation: With patient Time For Goal Achievement: 04/19/18 Potential to Achieve Goals: Good  Plan Discharge plan remains appropriate;Frequency remains appropriate    Co-evaluation                 AM-PAC PT "6 Clicks" Daily Activity     Outcome Measure   Help from another person eating meals?: None Help from another person taking care of personal grooming?: A Little Help from another person toileting, which includes using toliet, bedpan, or urinal?: A Little Help from another person bathing (including washing, rinsing, drying)?: A Little Help from another person to put on and taking off regular upper body clothing?: None Help from another person to put on and taking off regular lower body clothing?: A Little 6 Click Score: 20    End of Session Equipment Utilized During Treatment: Other (comment)(wc)  OT Visit Diagnosis: Other abnormalities of gait and mobility (R26.89);Muscle weakness (generalized) (M62.81);Pain Pain - Right/Left: Right Pain - part of body: Leg   Activity Tolerance Patient tolerated treatment well   Patient Left in chair;with call bell/phone within reach   Nurse Communication          Time: 1610-9604 OT Time Calculation (min): 28 min  Charges: OT General Charges $OT Visit: 1 Visit OT Treatments $Self Care/Home Management : 23-37 mins  Chancy Milroy, OTR/L  Pager [phone]    Chancy Milroy 04/10/2018, 3:37 PM

## 2018-04-11 ENCOUNTER — Telehealth: Payer: Self-pay | Admitting: Vascular Surgery

## 2018-04-11 LAB — PROTIME-INR
INR: 1.36
PROTHROMBIN TIME: 16.7 s — AB (ref 11.4–15.2)

## 2018-04-11 LAB — HEPARIN LEVEL (UNFRACTIONATED): Heparin Unfractionated: 0.33 IU/mL (ref 0.30–0.70)

## 2018-04-11 MED ORDER — TAMSULOSIN HCL 0.4 MG PO CAPS: 0.8000 mg | ORAL_CAPSULE | Freq: Every day | ORAL | 0 refills | Status: DC

## 2018-04-11 MED ORDER — TRAMADOL HCL 50 MG PO TABS: 50.0000 mg | ORAL_TABLET | Freq: Four times a day (QID) | ORAL | 0 refills | Status: DC | PRN

## 2018-04-11 MED ORDER — CEPHALEXIN 500 MG PO CAPS: 500.0000 mg | ORAL_CAPSULE | Freq: Four times a day (QID) | ORAL | 0 refills | Status: DC

## 2018-04-11 MED ORDER — CEPHALEXIN 500 MG PO CAPS: 500.0000 mg | ORAL_CAPSULE | Freq: Three times a day (TID) | ORAL | 0 refills | Status: DC

## 2018-04-11 MED ORDER — BOOST PLUS PO LIQD: 237.0000 mL | Freq: Three times a day (TID) | ORAL | 0 refills | Status: DC

## 2018-04-11 MED ORDER — ENSURE ENLIVE PO LIQD: 237.0000 mL | Freq: Two times a day (BID) | ORAL | 12 refills | Status: DC

## 2018-04-11 MED ORDER — WARFARIN SODIUM 7.5 MG PO TABS: 7.5000 mg | ORAL_TABLET | Freq: Once | ORAL | Status: DC

## 2018-04-11 NOTE — Progress Notes (Signed)
Spoke with Jose Bush and his wife about coumadin.  Dr. Dimas AguasHoward follows his coumadin closely and is not usually bridged.  She will call him this morning with INR results from today and instructions for dosing.    [facility]    He has an appointment with Dr. Ret[place]stedt [place] middle of next month.  Will also give one month of Flomax until he is assessed by urology.  F/u with Dr. Darrick PennaFields in 2 weeks.     Doreatha MassedSamantha Kristof Nadeem, [facility] 04/11/2018 8:55 AM

## 2018-04-11 NOTE — Progress Notes (Signed)
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  Assessment: [age] YOM presents on 04/02/18 with redness and swelling of stump. Patient was on Coumadin PTA for thrombophilia due to to lupus, held for possible surgery and IV heparin bridge started.  Coumadin resumed by Vascular on 04/05/18 and then per Pharmacy on 04/08/18.   Heparin level is therapeutic and INR is starting to trend up.  No bleeding reported.  Home Coumadin dose:  5mg  PO daily  Goal of Therapy:  Heparin level 0.3-0.7 units/ml Monitor platelets by anticoagulation protocol: Yes   Plan:  Continue heparin gtt at 1700 units/hr Coumadin 7.5mg  PO today Daily heparin level, PT / INR and CBC  Nadara MustardNita Antron Seth, PharmD., MS Clinical Pharmacist Pager:  [phone] Thank you for allowing pharmacy to be part of this patients care team. 04/11/2018, 11:09 AM

## 2018-04-11 NOTE — Progress Notes (Signed)
Discharge instructions completed with pt. Pt verbalized understanding of the information.  Pt denies chest pain, shortness of breath, dizziness, lightheadedness, and n/v.  Pt discharged home.  

## 2018-04-11 NOTE — Progress Notes (Signed)
  Progress Note    04/11/2018 11:35 AM * No surgery found *  Subjective: No acute issues  Vitals:   04/10/18 2036 04/11/18 0500  BP: 135/77 104/63  Pulse: 84 81  Resp: 17 16  Temp: 98 F (36.7 C) 98 F (36.7 C)  SpO2: 99% 98%    Physical Exam: Awake alert and oriented BKA site on the right with small dehiscence which is dry and staples are in place  CBC    Component Value Date/Time   WBC 5.5 04/10/2018 0345   RBC 3.41 (L) 04/10/2018 0345   HGB 9.6 (L) 04/10/2018 0345   HCT 30.9 (L) 04/10/2018 0345   PLT 216 04/10/2018 0345   MCV 90.6 04/10/2018 0345   MCH 28.2 04/10/2018 0345   MCHC 31.1 04/10/2018 0345   RDW 14.9 04/10/2018 0345   LYMPHSABS 1.4 04/02/2018 1533   MONOABS 0.8 04/02/2018 1533   EOSABS 0.6 04/02/2018 1533   BASOSABS 0.0 04/02/2018 1533    BMET    Component Value Date/Time   NA 137 04/09/2018 0425   K 3.8 04/09/2018 0425   CL 101 04/09/2018 0425   CO2 25 04/09/2018 0425   GLUCOSE 93 04/09/2018 0425   BUN 8 04/09/2018 0425   CREATININE 0.65 04/09/2018 0425   CALCIUM 9.1 04/09/2018 0425   GFRNONAA >60 04/09/2018 0425   GFRAA >60 04/09/2018 0425    INR    Component Value Date/Time   INR 1.36 04/11/2018 0527     Intake/Output Summary (Last 24 hours) at 04/11/2018 1135 Last data filed at 04/11/2018 0900 Gross per 24 hour  Intake 1203 ml  Output -  Net 1203 ml     Assessment:  77 y.o. male is here with erythema around his BKA site now improved.  Plan: Discharge today Keflex Coumadin no bridge per previous instructions Follow-up Dr. Darrick PennaFields 2 weeks.   Johnryan Sao C. Randie Heinzain, MD [facility] Office: [phone] Pager: [phone]  04/11/2018 11:35 AM

## 2018-04-11 NOTE — Telephone Encounter (Signed)
sch app lvm 04/28/18 245pm wound check

## 2018-04-11 NOTE — Care Management Note (Signed)
Case Management Note Original note by:  Durenda GuthrieBrady, Susan Naomi, RN 04/08/2018, 10:47 AM    Patient Details  Name: Jose Bush MRN: [id number] Date of Birth: 07/02/1941  Subjective/Objective:   77 yr old gentleman readmitted with cellulitis of right BKA, surgery was performed 03/18/18.                  Action/Plan: Patient will receive IV antibiotics until Saturday 04/12/18 and will then be reevaluated by surgeon. Case manager spoke with patient and wife concerning discharge plans and DME needs for future discharge. Patient's wife says they will use [facility], he has 3in1, will need wheelchair. CM called referral to Tiajuana AmassJermain Jenkins, [facility]. Patient will have family support when discharged.     Expected Discharge Date:  07/12/19pending               Expected Discharge Plan:  Home w Home Health Services  In-House Referral:     Discharge planning Services  CM Consult  Post Acute Care Choice:  Durable Medical Equipment, Home Health Choice offered to:  Patient, Spouse  DME Arranged:  Wheelchair manual DME Agency:  [facility]  HH Arranged:  [facility] OT, [facility]  [facility]  Status of Service:  Completed, signed off  If discussed at Long Length of Stay Meetings, dates discussed:    Additional Comments:  04/11/18 J. Lanina Larranaga, RN, BSN Pt medically stable for dc home today with spouse.  [facility]  [facility] at home.  Order received from provider; [facility] for wound checks and INR follow up.  INR results to be sent to pt's PCP, Dr. Selinda FlavinKevin Bush, in [place].  Pt has new wheelchair in room for home use.    Quintella BatonJulie W. Cornel Werber, RN, BSN  Trauma/Neuro ICU Case Manager [phone]

## 2018-04-12 DIAGNOSIS — I10 Essential (primary) hypertension: Secondary | ICD-10-CM | POA: Diagnosis not present

## 2018-04-12 DIAGNOSIS — M329 Systemic lupus erythematosus, unspecified: Secondary | ICD-10-CM | POA: Diagnosis not present

## 2018-04-12 DIAGNOSIS — Z89511 Acquired absence of right leg below knee: Secondary | ICD-10-CM | POA: Diagnosis not present

## 2018-04-12 DIAGNOSIS — I69354 Hemiplegia and hemiparesis following cerebral infarction affecting left non-dominant side: Secondary | ICD-10-CM | POA: Diagnosis not present

## 2018-04-12 DIAGNOSIS — D6862 Lupus anticoagulant syndrome: Secondary | ICD-10-CM | POA: Diagnosis not present

## 2018-04-12 DIAGNOSIS — K219 Gastro-esophageal reflux disease without esophagitis: Secondary | ICD-10-CM | POA: Diagnosis not present

## 2018-04-12 DIAGNOSIS — I739 Peripheral vascular disease, unspecified: Secondary | ICD-10-CM | POA: Diagnosis not present

## 2018-04-12 DIAGNOSIS — Z4781 Encounter for orthopedic aftercare following surgical amputation: Secondary | ICD-10-CM | POA: Diagnosis not present

## 2018-04-12 DIAGNOSIS — M1990 Unspecified osteoarthritis, unspecified site: Secondary | ICD-10-CM | POA: Diagnosis not present

## 2018-04-14 DIAGNOSIS — I10 Essential (primary) hypertension: Secondary | ICD-10-CM | POA: Diagnosis not present

## 2018-04-14 DIAGNOSIS — M1990 Unspecified osteoarthritis, unspecified site: Secondary | ICD-10-CM | POA: Diagnosis not present

## 2018-04-14 DIAGNOSIS — I69354 Hemiplegia and hemiparesis following cerebral infarction affecting left non-dominant side: Secondary | ICD-10-CM | POA: Diagnosis not present

## 2018-04-14 DIAGNOSIS — M329 Systemic lupus erythematosus, unspecified: Secondary | ICD-10-CM | POA: Diagnosis not present

## 2018-04-14 DIAGNOSIS — I739 Peripheral vascular disease, unspecified: Secondary | ICD-10-CM | POA: Diagnosis not present

## 2018-04-14 DIAGNOSIS — Z89511 Acquired absence of right leg below knee: Secondary | ICD-10-CM | POA: Diagnosis not present

## 2018-04-14 DIAGNOSIS — D6862 Lupus anticoagulant syndrome: Secondary | ICD-10-CM | POA: Diagnosis not present

## 2018-04-14 DIAGNOSIS — K219 Gastro-esophageal reflux disease without esophagitis: Secondary | ICD-10-CM | POA: Diagnosis not present

## 2018-04-14 DIAGNOSIS — Z4781 Encounter for orthopedic aftercare following surgical amputation: Secondary | ICD-10-CM | POA: Diagnosis not present

## 2018-04-15 DIAGNOSIS — I739 Peripheral vascular disease, unspecified: Secondary | ICD-10-CM | POA: Diagnosis not present

## 2018-04-15 DIAGNOSIS — Z89511 Acquired absence of right leg below knee: Secondary | ICD-10-CM | POA: Diagnosis not present

## 2018-04-15 DIAGNOSIS — D6862 Lupus anticoagulant syndrome: Secondary | ICD-10-CM | POA: Diagnosis not present

## 2018-04-15 DIAGNOSIS — Z4781 Encounter for orthopedic aftercare following surgical amputation: Secondary | ICD-10-CM | POA: Diagnosis not present

## 2018-04-15 DIAGNOSIS — I10 Essential (primary) hypertension: Secondary | ICD-10-CM | POA: Diagnosis not present

## 2018-04-15 DIAGNOSIS — M1990 Unspecified osteoarthritis, unspecified site: Secondary | ICD-10-CM | POA: Diagnosis not present

## 2018-04-15 DIAGNOSIS — I69354 Hemiplegia and hemiparesis following cerebral infarction affecting left non-dominant side: Secondary | ICD-10-CM | POA: Diagnosis not present

## 2018-04-15 DIAGNOSIS — M329 Systemic lupus erythematosus, unspecified: Secondary | ICD-10-CM | POA: Diagnosis not present

## 2018-04-15 DIAGNOSIS — K219 Gastro-esophageal reflux disease without esophagitis: Secondary | ICD-10-CM | POA: Diagnosis not present

## 2018-04-16 DIAGNOSIS — I10 Essential (primary) hypertension: Secondary | ICD-10-CM | POA: Diagnosis not present

## 2018-04-16 DIAGNOSIS — K219 Gastro-esophageal reflux disease without esophagitis: Secondary | ICD-10-CM | POA: Diagnosis not present

## 2018-04-16 DIAGNOSIS — Z4781 Encounter for orthopedic aftercare following surgical amputation: Secondary | ICD-10-CM | POA: Diagnosis not present

## 2018-04-16 DIAGNOSIS — I69354 Hemiplegia and hemiparesis following cerebral infarction affecting left non-dominant side: Secondary | ICD-10-CM | POA: Diagnosis not present

## 2018-04-16 DIAGNOSIS — M329 Systemic lupus erythematosus, unspecified: Secondary | ICD-10-CM | POA: Diagnosis not present

## 2018-04-16 DIAGNOSIS — Z89511 Acquired absence of right leg below knee: Secondary | ICD-10-CM | POA: Diagnosis not present

## 2018-04-16 DIAGNOSIS — D6862 Lupus anticoagulant syndrome: Secondary | ICD-10-CM | POA: Diagnosis not present

## 2018-04-16 DIAGNOSIS — M1990 Unspecified osteoarthritis, unspecified site: Secondary | ICD-10-CM | POA: Diagnosis not present

## 2018-04-16 DIAGNOSIS — I739 Peripheral vascular disease, unspecified: Secondary | ICD-10-CM | POA: Diagnosis not present

## 2018-04-18 DIAGNOSIS — I69354 Hemiplegia and hemiparesis following cerebral infarction affecting left non-dominant side: Secondary | ICD-10-CM | POA: Diagnosis not present

## 2018-04-18 DIAGNOSIS — I10 Essential (primary) hypertension: Secondary | ICD-10-CM | POA: Diagnosis not present

## 2018-04-18 DIAGNOSIS — I739 Peripheral vascular disease, unspecified: Secondary | ICD-10-CM | POA: Diagnosis not present

## 2018-04-18 DIAGNOSIS — D6862 Lupus anticoagulant syndrome: Secondary | ICD-10-CM | POA: Diagnosis not present

## 2018-04-18 DIAGNOSIS — Z89511 Acquired absence of right leg below knee: Secondary | ICD-10-CM | POA: Diagnosis not present

## 2018-04-18 DIAGNOSIS — M329 Systemic lupus erythematosus, unspecified: Secondary | ICD-10-CM | POA: Diagnosis not present

## 2018-04-18 DIAGNOSIS — M1990 Unspecified osteoarthritis, unspecified site: Secondary | ICD-10-CM | POA: Diagnosis not present

## 2018-04-18 DIAGNOSIS — K219 Gastro-esophageal reflux disease without esophagitis: Secondary | ICD-10-CM | POA: Diagnosis not present

## 2018-04-18 DIAGNOSIS — Z4781 Encounter for orthopedic aftercare following surgical amputation: Secondary | ICD-10-CM | POA: Diagnosis not present

## 2018-04-21 NOTE — Discharge Summary (Signed)
Discharge Summary    Jose [name] 09-18-1941 77 y.o. male  [id number]  Admission Date: 04/02/2018  Discharge Date: 04/11/18  Physician: No att. providers found  Admission Diagnosis: Surgical site infection [T81.49XA]   HPI:   This is a 77 y.o. male s/p right BKA 2 weeks ago after a failed toe amputation. He was doing well until about 48 hours ago when he developed redness and swelling and pain in the stump.  He has had some clear drainage.  He denies fever or chills.Marland Kitchen.Marland Kitchen.Other medical problems include lupus and arthritis.  He is on coumadin secondary to thrombophilia from his lupus.  Hospital Course:  The patient was admitted to the hospital and placed on IV abx.  Over the course of the hospitalization, his appetite is improving and no pain in right BKA stump.    On 04/05/18, Dr. Edilia Boickson inspected the wound where he had some eschar on the lateral wound and his wife felt this had improved.  He was started back on his coumadin.  He inspected it the next day and the cellulitis was improved.  There was some drainage from the lateral aspect and he ws continued on abx and made npo  For possible washout in the OR.  He had a catheter placed by the urologist a week ago and was told to remove the catheter in one week and therefore it was removed on 04/06/18.    Dr. Darrick PennaFields inspected the wound on 04/07/18 and felt it was significantly improved but still tenuous.  Continue IV abx for a total of 10 days and then reassess.  Continue coumadin.  On 04/09/18, the pt still had some necrotic tissue lateral incision and sanguinous drainage on the alteral and medial aspect but significantly improved.    Spoke with pt and his wife about coumadin.  Dr. Dimas AguasHoward follows his coumadin closely and is not usually bridged.  She will call him this morning with INR results from today and instructions for dosing.    Will send Keflex and Tr[facility]ad[facility] to [facility].    He has an appointment with Dr.  Retta Dionesahlstedt in [place] middle of next month.  Will also give one month of Flomax until he is assessed by urology.  F/u with Dr. Darrick PennaFields in 2 weeks.    The remainder of the hospital course consisted of increasing mobilization and increasing intake of solids without difficulty.  CBC    Component Value Date/Time   WBC 5.5 04/10/2018 0345   RBC 3.41 (L) 04/10/2018 0345   HGB 9.6 (L) 04/10/2018 0345   HCT 30.9 (L) 04/10/2018 0345   PLT 216 04/10/2018 0345   MCV 90.6 04/10/2018 0345   MCH 28.2 04/10/2018 0345   MCHC 31.1 04/10/2018 0345   RDW 14.9 04/10/2018 0345   LYMPHSABS 1.4 04/02/2018 1533   MONOABS 0.8 04/02/2018 1533   EOSABS 0.6 04/02/2018 1533   BASOSABS 0.0 04/02/2018 1533    BMET    Component Value Date/Time   NA 137 04/09/2018 0425   K 3.8 04/09/2018 0425   CL 101 04/09/2018 0425   CO2 25 04/09/2018 0425   GLUCOSE 93 04/09/2018 0425   BUN 8 04/09/2018 0425   CREATININE 0.65 04/09/2018 0425   CALCIUM 9.1 04/09/2018 0425   GFRNONAA >60 04/09/2018 0425   GFRAA >60 04/09/2018 0425      Discharge Instructions    Call MD for:  redness, tenderness, or signs of infection (pain, swelling, bleeding, redness, odor or green/yellow discharge around incision site)  Complete by:  As directed    Call MD for:  severe or increased pain, loss or decreased feeling  in affected limb(s)   Complete by:  As directed    Call MD for:  temperature >100.5   Complete by:  As directed    Change dressing (specify)   Complete by:  As directed    Dry dressing to right leg incision daily and as needed   Discharge wound care:   Complete by:  As directed    Shower daily with soap and water starting 04/11/18   Driving Restrictions   Complete by:  As directed    No driving for 4 weeks   Resume previous diet   Complete by:  As directed       Discharge Diagnosis:  Surgical site infection [T81.49XA]  Secondary Diagnosis: Patient Active Problem List   Diagnosis Date Noted  .  Non-healing wound of lower extremity 03/18/2018  . Nonhealing surgical wound 03/12/2018  . PAD (peripheral artery disease) (HCC) 03/03/2018  . PVD (peripheral vascular disease) (HCC) 03/03/2018  . Iron deficiency anemia   . Atrophic gastritis without hemorrhage   . OA (osteoarthritis) of hip 05/01/2017  . OA (osteoarthritis) of knee 12/13/2014  . Other pancytopenia (HCC) 02/04/2013  . Fever 02/02/2013  . Neutropenia (HCC) 02/02/2013  . Weakness 02/01/2013  . Rigors 02/01/2013  . Headache(784.0) 02/01/2013  . Lupus (HCC) 02/01/2013  . Thrombocytopenia (HCC) 02/01/2013  . DVT (deep venous thrombosis) (HCC)   . HYPERTENSION, UNSPECIFIED 06/22/2009  . CHEST PAIN-UNSPECIFIED 06/22/2009   Past Medical History:  Diagnosis Date  . Arthritis   . Chest pain, unspecified   . DVT (deep venous thrombosis) (HCC)   . GERD (gastroesophageal reflux disease)   . Heart murmur    hx of   . History of hiatal hernia   . HTN (hypertension)   . Lupus (HCC)   . Lupus (HCC)   . Peripheral vascular disease (HCC)    nonviable tissue   . Shingles    hx of   . Stroke [facility]) 2010   'MILD" per pt-  weakness left side  . Tinnitus   . Ulcer of abdomen wall (HCC)   . Wears dentures   . Wears glasses      Allergies as of 04/11/2018   No Known Allergies     Medication List    STOP taking these medications   oxyCODONE 5 MG immediate release tablet Commonly known as:  Oxy IR/ROXICODONE     TAKE these medications   acetaminophen 500 MG tablet Commonly known as:  TYLENOL Take 500 mg by mouth every 6 (six) hours as needed for moderate pain or headache.   cephALEXin 500 MG capsule Commonly known as:  KEFLEX Take 1 capsule (500 mg total) by mouth 3 (three) times daily.   docusate sodium 100 MG capsule Commonly known as:  COLACE Take 100 mg by mouth daily.   ESTER C PO Take 500 mg by mouth daily.   feeding supplement (ENSURE ENLIVE) Liqd Take 237 mLs by mouth 2 (two) times daily between  meals.   lactose free nutrition Liqd Take 237 mLs by mouth 3 (three) times daily with meals.   hydroxychloroquine 200 MG tablet Commonly known as:  PLAQUENIL Take 200 mg by mouth 2 (two) times daily.   ICY HOT EX Apply 1 application topically 2 (two) times daily as needed (left knee.).   lisinopril-hydrochlorothiazide 10-12.5 MG tablet Commonly known as:  PRINZIDE,ZESTORETIC Take 1 tablet  by mouth daily with breakfast.   multivitamin with minerals Tabs tablet Take 1 tablet by mouth daily. Centrum Silver   NITROSTAT 0.4 MG SL tablet Generic drug:  nitroGLYCERIN Place 0.4 mg under the tongue every 5 (five) minutes as needed for chest pain. x3 doses as needed for chest pain   OMEGA-3 KRILL OIL PO Take 1 capsule by mouth daily.   omeprazole 20 MG capsule Commonly known as:  PRILOSEC Take 20 mg by mouth daily.   tamsulosin 0.4 MG Caps capsule Commonly known as:  FLOMAX Take 2 capsules (0.8 mg total) by mouth daily.   traMADol 50 MG tablet Commonly known as:  ULTRAM Take 1 tablet (50 mg total) by mouth every 6 (six) hours as needed for severe pain.   URINOZINC PO Take 1 capsule by mouth 2 (two) times daily.   warfarin 5 MG tablet Commonly known as:  COUMADIN Take 1 tablet (5 mg total) by mouth every morning. Take Coumadin for three weeks for postoperative protocol and then the patient may resume their previous Coumadin home regimen.  The dose may need to be adjusted based upon the INR.  Please follow the INR and titrate Coumadin dose for a therapeutic range between 2.0 and 3.0 INR.  After completing the three weeks of Coumadin, the patient may resume their previous Coumadin home regimen. What changed:    when to take this  additional instructions            Discharge Care Instructions  (From admission, onward)        Start     Ordered   04/11/18 0000  Discharge wound care:    Comments:  Shower daily with soap and water starting 04/11/18   04/11/18 0913    04/11/18 0000  Change dressing (specify)    Comments:  Dry dressing to right leg incision daily and as needed   04/11/18 0913      Prescriptions given: Tramadol #15 No Refill Keflex 500mg  tid flomax 0.4mg  qd until pt is seen by Dr. Retta Diones  Instructions: Information on my medicine - Coumadin   (Warfarin)  This medication education was reviewed with me or my healthcare representative as part of my discharge preparation.  The pharmacist that spoke with me during my hospital stay was:  Lennon Alstrom, [facility]  Why was Coumadin prescribed for you? Coumadin was prescribed for you because you have a blood clot or a medical condition that can cause an increased risk of forming blood clots. Blood clots can cause serious health problems by blocking the flow of blood to the heart, lung, or brain. Coumadin can prevent harmful blood clots from forming. As a reminder your indication for Coumadin is:   Select from menu thrombophilia due to lupus  What test will check on my response to Coumadin? While on Coumadin (warfarin) you will need to have an INR test regularly to ensure that your dose is keeping you in the desired range. The INR (international normalized ratio) number is calculated from the result of the laboratory test called prothrombin time (PT).  If an INR APPOINTMENT HAS NOT ALREADY BEEN MADE FOR YOU please schedule an appointment to have this lab work done by your health care provider within 7 days. Your INR goal is usually a number between:  2 to 3 or your provider may give you a more narrow range like 2-2.5.  Ask your health care provider during an office visit what your goal INR is.  What  do  you need to  know  About  COUMADIN? Take Coumadin (warfarin) exactly as prescribed by your healthcare provider about the same time each day.  DO NOT stop taking without talking to the doctor who prescribed the medication.  Stopping without other blood clot prevention medication to take the place of  Coumadin may increase your risk of developing a new clot or stroke.  Get refills before you run out.  What do you do if you miss a dose? If you miss a dose, take it as soon as you remember on the same day then continue your regularly scheduled regimen the next day.  Do not take two doses of Coumadin at the same time.  Important Safety Information A possible side effect of Coumadin (Warfarin) is an increased risk of bleeding. You should call your healthcare provider right away if you experience any of the following: ? Bleeding from an injury or your nose that does not stop. ? Unusual colored urine (red or dark brown) or unusual colored stools (red or black). ? Unusual bruising for unknown reasons. ? A serious fall or if you hit your head (even if there is no bleeding).  Some foods or medicines interact with Coumadin (warfarin) and might alter your response to warfarin. To help avoid this: ? Eat a balanced diet, maintaining a consistent amount of Vitamin K. ? Notify your provider about major diet changes you plan to make. ? Avoid alcohol or limit your intake to 1 drink for women and 2 drinks for men per day. (1 drink is 5 oz. wine, 12 oz. beer, or 1.5 oz. liquor.)  Make sure that ANY health care provider who prescribes medication for you knows that you are taking Coumadin (warfarin).  Also make sure the healthcare provider who is monitoring your Coumadin knows when you have started a new medication including herbals and non-prescription products.  Coumadin (Warfarin)  Major Drug Interactions  Increased Warfarin Effect Decreased Warfarin Effect  Alcohol (large quantities) Antibiotics (esp. Septra/Bactrim, Flagyl, Cipro) Amiodarone (Cordarone) Aspirin (ASA) Cimetidine (Tagamet) Megestrol (Megace) NSAIDs (ibuprofen, naproxen, etc.) Piroxicam (Feldene) Propafenone (Rythmol SR) Propranolol (Inderal) Isoniazid (INH) Posaconazole (Noxafil) Barbiturates (Phenobarbital) Carbamazepine  (Tegretol) Chlordiazepoxide (Librium) Cholestyramine (Questran) Griseofulvin Oral Contraceptives Rifampin Sucralfate (Carafate) Vitamin K   Coumadin (Warfarin) Major Herbal Interactions  Increased Warfarin Effect Decreased Warfarin Effect  Garlic Ginseng Ginkgo biloba Coenzyme Q10 Green tea St. John's wort    Coumadin (Warfarin) FOOD Interactions  Eat a consistent number of servings per week of foods HIGH in Vitamin K (1 serving =  cup)  Collards (cooked, or boiled & drained) Kale (cooked, or boiled & drained) Mustard greens (cooked, or boiled & drained) Parsley *serving size only =  cup Spinach (cooked, or boiled & drained) Swiss chard (cooked, or boiled & drained) Turnip greens (cooked, or boiled & drained)  Eat a consistent number of servings per week of foods MEDIUM-HIGH in Vitamin K (1 serving = 1 cup)  Asparagus (cooked, or boiled & drained) Broccoli (cooked, boiled & drained, or raw & chopped) Brussel sprouts (cooked, or boiled & drained) *serving size only =  cup Lettuce, raw (green leaf, endive, romaine) Spinach, raw Turnip greens, raw & chopped   These websites have more information on Coumadin (warfarin):  http://www.king-russell.com/; https://www.hines.net/;   Disposition: home  Patient's condition: is Good  Follow up: 1. Dr. Darrick Penna on 04/28/18 2. Dr. Retta Diones in [place] in August 3. Pt will f/u with Dr. Dimas Aguas on INR and further instructions on coumadin   Lelon Mast  [facility] [phone] 04/21/2018  9:24 AM

## 2018-04-22 DIAGNOSIS — I739 Peripheral vascular disease, unspecified: Secondary | ICD-10-CM | POA: Diagnosis not present

## 2018-04-22 DIAGNOSIS — Z4781 Encounter for orthopedic aftercare following surgical amputation: Secondary | ICD-10-CM | POA: Diagnosis not present

## 2018-04-22 DIAGNOSIS — K219 Gastro-esophageal reflux disease without esophagitis: Secondary | ICD-10-CM | POA: Diagnosis not present

## 2018-04-22 DIAGNOSIS — D6862 Lupus anticoagulant syndrome: Secondary | ICD-10-CM | POA: Diagnosis not present

## 2018-04-22 DIAGNOSIS — I10 Essential (primary) hypertension: Secondary | ICD-10-CM | POA: Diagnosis not present

## 2018-04-22 DIAGNOSIS — Z89511 Acquired absence of right leg below knee: Secondary | ICD-10-CM | POA: Diagnosis not present

## 2018-04-22 DIAGNOSIS — I69354 Hemiplegia and hemiparesis following cerebral infarction affecting left non-dominant side: Secondary | ICD-10-CM | POA: Diagnosis not present

## 2018-04-22 DIAGNOSIS — M329 Systemic lupus erythematosus, unspecified: Secondary | ICD-10-CM | POA: Diagnosis not present

## 2018-04-22 DIAGNOSIS — M1990 Unspecified osteoarthritis, unspecified site: Secondary | ICD-10-CM | POA: Diagnosis not present

## 2018-04-23 DIAGNOSIS — M1990 Unspecified osteoarthritis, unspecified site: Secondary | ICD-10-CM | POA: Diagnosis not present

## 2018-04-23 DIAGNOSIS — M329 Systemic lupus erythematosus, unspecified: Secondary | ICD-10-CM | POA: Diagnosis not present

## 2018-04-23 DIAGNOSIS — Z4781 Encounter for orthopedic aftercare following surgical amputation: Secondary | ICD-10-CM | POA: Diagnosis not present

## 2018-04-23 DIAGNOSIS — I10 Essential (primary) hypertension: Secondary | ICD-10-CM | POA: Diagnosis not present

## 2018-04-23 DIAGNOSIS — D6862 Lupus anticoagulant syndrome: Secondary | ICD-10-CM | POA: Diagnosis not present

## 2018-04-23 DIAGNOSIS — I739 Peripheral vascular disease, unspecified: Secondary | ICD-10-CM | POA: Diagnosis not present

## 2018-04-23 DIAGNOSIS — I69354 Hemiplegia and hemiparesis following cerebral infarction affecting left non-dominant side: Secondary | ICD-10-CM | POA: Diagnosis not present

## 2018-04-23 DIAGNOSIS — Z89511 Acquired absence of right leg below knee: Secondary | ICD-10-CM | POA: Diagnosis not present

## 2018-04-23 DIAGNOSIS — K219 Gastro-esophageal reflux disease without esophagitis: Secondary | ICD-10-CM | POA: Diagnosis not present

## 2018-04-25 DIAGNOSIS — Z4781 Encounter for orthopedic aftercare following surgical amputation: Secondary | ICD-10-CM | POA: Diagnosis not present

## 2018-04-25 DIAGNOSIS — Z89511 Acquired absence of right leg below knee: Secondary | ICD-10-CM | POA: Diagnosis not present

## 2018-04-25 DIAGNOSIS — I10 Essential (primary) hypertension: Secondary | ICD-10-CM | POA: Diagnosis not present

## 2018-04-25 DIAGNOSIS — K219 Gastro-esophageal reflux disease without esophagitis: Secondary | ICD-10-CM | POA: Diagnosis not present

## 2018-04-25 DIAGNOSIS — I69354 Hemiplegia and hemiparesis following cerebral infarction affecting left non-dominant side: Secondary | ICD-10-CM | POA: Diagnosis not present

## 2018-04-25 DIAGNOSIS — M329 Systemic lupus erythematosus, unspecified: Secondary | ICD-10-CM | POA: Diagnosis not present

## 2018-04-25 DIAGNOSIS — M1990 Unspecified osteoarthritis, unspecified site: Secondary | ICD-10-CM | POA: Diagnosis not present

## 2018-04-25 DIAGNOSIS — D6862 Lupus anticoagulant syndrome: Secondary | ICD-10-CM | POA: Diagnosis not present

## 2018-04-25 DIAGNOSIS — I739 Peripheral vascular disease, unspecified: Secondary | ICD-10-CM | POA: Diagnosis not present

## 2018-04-28 ENCOUNTER — Encounter: Payer: Self-pay | Admitting: Vascular Surgery

## 2018-04-28 ENCOUNTER — Other Ambulatory Visit: Payer: Self-pay

## 2018-04-28 ENCOUNTER — Ambulatory Visit (INDEPENDENT_AMBULATORY_CARE_PROVIDER_SITE_OTHER): Payer: Self-pay | Admitting: Vascular Surgery

## 2018-04-28 VITALS — BP 116/63 | HR 76 | Temp 97.7°F | Resp 16 | Ht 68.0 in | Wt 186.0 lb

## 2018-04-28 DIAGNOSIS — I82503 Chronic embolism and thrombosis of unspecified deep veins of lower extremity, bilateral: Secondary | ICD-10-CM | POA: Diagnosis not present

## 2018-04-28 DIAGNOSIS — I739 Peripheral vascular disease, unspecified: Secondary | ICD-10-CM

## 2018-04-28 MED ORDER — TAMSULOSIN HCL 0.4 MG PO CAPS: 0.4000 mg | ORAL_CAPSULE | Freq: Every day | ORAL | 1 refills | Status: AC

## 2018-04-28 MED ORDER — TRAMADOL HCL 50 MG PO TABS: 50.0000 mg | ORAL_TABLET | Freq: Four times a day (QID) | ORAL | 0 refills | Status: DC | PRN

## 2018-04-28 NOTE — Progress Notes (Signed)
Patient is a 77 year old male who returns for follow-up today.  He underwent right below-knee amputation on March 18, 2018.  He was recently admitted to the hospital with a cellulitis and breakdown of the below-knee amputation site.  He returns today for further follow-up.  He was set home on a course of Keflex and has now completed this.  He has been off antibiotics for about 5 days.  He reports no fever or chills.  He does have a small amount of serous drainage.  He has occasional pain but not as much as he had prior to hospital admission.  He is still requesting some Ultram for control the pain intermittently.  He is washing this daily with soap and water.  Physical exam:  Vitals:   04/28/18 1432  BP: 116/63  Pulse: 76  Resp: 16  Temp: 97.7 F (36.5 C)  TempSrc: Oral  SpO2: 98%  Weight: 186 lb (84.4 kg)  Height: 5\' 8"  (1.727 m)    Right below-knee amputation fibrinous exudate and eschar on the lateral edges and medial edges of the incision, still edematous staples removed from the right and left corners today.  There is an open wound with some granulation tissue about 3 x 3 cm on the lateral corner and 2 x 2 cm on the medial corner  Assessment: Slowly healing right below-knee amputation.  Still erythema present but not as bad as in the hospital I do not think he would benefit from further antibiotics at this point.  We will do local wound care on the corners with hydrogel once daily.  He will return for follow-up with me in 2 weeks.  Plan: See above  Fabienne Brunsharles Reverie Vaquera, MD [facility] [place] Office: [phone] Pager: [phone]

## 2018-04-29 DIAGNOSIS — I10 Essential (primary) hypertension: Secondary | ICD-10-CM | POA: Diagnosis not present

## 2018-04-29 DIAGNOSIS — M329 Systemic lupus erythematosus, unspecified: Secondary | ICD-10-CM | POA: Diagnosis not present

## 2018-04-29 DIAGNOSIS — I739 Peripheral vascular disease, unspecified: Secondary | ICD-10-CM | POA: Diagnosis not present

## 2018-04-29 DIAGNOSIS — D6862 Lupus anticoagulant syndrome: Secondary | ICD-10-CM | POA: Diagnosis not present

## 2018-04-29 DIAGNOSIS — Z89511 Acquired absence of right leg below knee: Secondary | ICD-10-CM | POA: Diagnosis not present

## 2018-04-29 DIAGNOSIS — M1990 Unspecified osteoarthritis, unspecified site: Secondary | ICD-10-CM | POA: Diagnosis not present

## 2018-04-29 DIAGNOSIS — Z4781 Encounter for orthopedic aftercare following surgical amputation: Secondary | ICD-10-CM | POA: Diagnosis not present

## 2018-04-29 DIAGNOSIS — K219 Gastro-esophageal reflux disease without esophagitis: Secondary | ICD-10-CM | POA: Diagnosis not present

## 2018-04-29 DIAGNOSIS — I69354 Hemiplegia and hemiparesis following cerebral infarction affecting left non-dominant side: Secondary | ICD-10-CM | POA: Diagnosis not present

## 2018-05-01 DIAGNOSIS — M329 Systemic lupus erythematosus, unspecified: Secondary | ICD-10-CM | POA: Diagnosis not present

## 2018-05-01 DIAGNOSIS — I10 Essential (primary) hypertension: Secondary | ICD-10-CM | POA: Diagnosis not present

## 2018-05-01 DIAGNOSIS — M1990 Unspecified osteoarthritis, unspecified site: Secondary | ICD-10-CM | POA: Diagnosis not present

## 2018-05-01 DIAGNOSIS — I739 Peripheral vascular disease, unspecified: Secondary | ICD-10-CM | POA: Diagnosis not present

## 2018-05-01 DIAGNOSIS — D6862 Lupus anticoagulant syndrome: Secondary | ICD-10-CM | POA: Diagnosis not present

## 2018-05-01 DIAGNOSIS — Z4781 Encounter for orthopedic aftercare following surgical amputation: Secondary | ICD-10-CM | POA: Diagnosis not present

## 2018-05-01 DIAGNOSIS — K219 Gastro-esophageal reflux disease without esophagitis: Secondary | ICD-10-CM | POA: Diagnosis not present

## 2018-05-01 DIAGNOSIS — I69354 Hemiplegia and hemiparesis following cerebral infarction affecting left non-dominant side: Secondary | ICD-10-CM | POA: Diagnosis not present

## 2018-05-01 DIAGNOSIS — Z89511 Acquired absence of right leg below knee: Secondary | ICD-10-CM | POA: Diagnosis not present

## 2018-05-02 DIAGNOSIS — I69354 Hemiplegia and hemiparesis following cerebral infarction affecting left non-dominant side: Secondary | ICD-10-CM | POA: Diagnosis not present

## 2018-05-02 DIAGNOSIS — I10 Essential (primary) hypertension: Secondary | ICD-10-CM | POA: Diagnosis not present

## 2018-05-02 DIAGNOSIS — Z4781 Encounter for orthopedic aftercare following surgical amputation: Secondary | ICD-10-CM | POA: Diagnosis not present

## 2018-05-02 DIAGNOSIS — Z89511 Acquired absence of right leg below knee: Secondary | ICD-10-CM | POA: Diagnosis not present

## 2018-05-02 DIAGNOSIS — M329 Systemic lupus erythematosus, unspecified: Secondary | ICD-10-CM | POA: Diagnosis not present

## 2018-05-02 DIAGNOSIS — K219 Gastro-esophageal reflux disease without esophagitis: Secondary | ICD-10-CM | POA: Diagnosis not present

## 2018-05-02 DIAGNOSIS — D6862 Lupus anticoagulant syndrome: Secondary | ICD-10-CM | POA: Diagnosis not present

## 2018-05-02 DIAGNOSIS — M1990 Unspecified osteoarthritis, unspecified site: Secondary | ICD-10-CM | POA: Diagnosis not present

## 2018-05-02 DIAGNOSIS — I739 Peripheral vascular disease, unspecified: Secondary | ICD-10-CM | POA: Diagnosis not present

## 2018-05-05 DIAGNOSIS — M1990 Unspecified osteoarthritis, unspecified site: Secondary | ICD-10-CM | POA: Diagnosis not present

## 2018-05-05 DIAGNOSIS — M329 Systemic lupus erythematosus, unspecified: Secondary | ICD-10-CM | POA: Diagnosis not present

## 2018-05-05 DIAGNOSIS — Z4781 Encounter for orthopedic aftercare following surgical amputation: Secondary | ICD-10-CM | POA: Diagnosis not present

## 2018-05-05 DIAGNOSIS — K219 Gastro-esophageal reflux disease without esophagitis: Secondary | ICD-10-CM | POA: Diagnosis not present

## 2018-05-05 DIAGNOSIS — D6862 Lupus anticoagulant syndrome: Secondary | ICD-10-CM | POA: Diagnosis not present

## 2018-05-05 DIAGNOSIS — Z89511 Acquired absence of right leg below knee: Secondary | ICD-10-CM | POA: Diagnosis not present

## 2018-05-05 DIAGNOSIS — I69354 Hemiplegia and hemiparesis following cerebral infarction affecting left non-dominant side: Secondary | ICD-10-CM | POA: Diagnosis not present

## 2018-05-05 DIAGNOSIS — I10 Essential (primary) hypertension: Secondary | ICD-10-CM | POA: Diagnosis not present

## 2018-05-05 DIAGNOSIS — I739 Peripheral vascular disease, unspecified: Secondary | ICD-10-CM | POA: Diagnosis not present

## 2018-05-06 DIAGNOSIS — Z89511 Acquired absence of right leg below knee: Secondary | ICD-10-CM | POA: Diagnosis not present

## 2018-05-06 DIAGNOSIS — M1990 Unspecified osteoarthritis, unspecified site: Secondary | ICD-10-CM | POA: Diagnosis not present

## 2018-05-06 DIAGNOSIS — K219 Gastro-esophageal reflux disease without esophagitis: Secondary | ICD-10-CM | POA: Diagnosis not present

## 2018-05-06 DIAGNOSIS — I69354 Hemiplegia and hemiparesis following cerebral infarction affecting left non-dominant side: Secondary | ICD-10-CM | POA: Diagnosis not present

## 2018-05-06 DIAGNOSIS — D6862 Lupus anticoagulant syndrome: Secondary | ICD-10-CM | POA: Diagnosis not present

## 2018-05-06 DIAGNOSIS — Z4781 Encounter for orthopedic aftercare following surgical amputation: Secondary | ICD-10-CM | POA: Diagnosis not present

## 2018-05-06 DIAGNOSIS — I10 Essential (primary) hypertension: Secondary | ICD-10-CM | POA: Diagnosis not present

## 2018-05-06 DIAGNOSIS — M329 Systemic lupus erythematosus, unspecified: Secondary | ICD-10-CM | POA: Diagnosis not present

## 2018-05-06 DIAGNOSIS — I739 Peripheral vascular disease, unspecified: Secondary | ICD-10-CM | POA: Diagnosis not present

## 2018-05-08 DIAGNOSIS — D6862 Lupus anticoagulant syndrome: Secondary | ICD-10-CM | POA: Diagnosis not present

## 2018-05-08 DIAGNOSIS — I10 Essential (primary) hypertension: Secondary | ICD-10-CM | POA: Diagnosis not present

## 2018-05-08 DIAGNOSIS — I739 Peripheral vascular disease, unspecified: Secondary | ICD-10-CM | POA: Diagnosis not present

## 2018-05-08 DIAGNOSIS — M329 Systemic lupus erythematosus, unspecified: Secondary | ICD-10-CM | POA: Diagnosis not present

## 2018-05-08 DIAGNOSIS — Z89511 Acquired absence of right leg below knee: Secondary | ICD-10-CM | POA: Diagnosis not present

## 2018-05-08 DIAGNOSIS — M1990 Unspecified osteoarthritis, unspecified site: Secondary | ICD-10-CM | POA: Diagnosis not present

## 2018-05-08 DIAGNOSIS — I69354 Hemiplegia and hemiparesis following cerebral infarction affecting left non-dominant side: Secondary | ICD-10-CM | POA: Diagnosis not present

## 2018-05-08 DIAGNOSIS — Z4781 Encounter for orthopedic aftercare following surgical amputation: Secondary | ICD-10-CM | POA: Diagnosis not present

## 2018-05-08 DIAGNOSIS — K219 Gastro-esophageal reflux disease without esophagitis: Secondary | ICD-10-CM | POA: Diagnosis not present

## 2018-05-09 DIAGNOSIS — I10 Essential (primary) hypertension: Secondary | ICD-10-CM | POA: Diagnosis not present

## 2018-05-09 DIAGNOSIS — Z89511 Acquired absence of right leg below knee: Secondary | ICD-10-CM | POA: Diagnosis not present

## 2018-05-09 DIAGNOSIS — K219 Gastro-esophageal reflux disease without esophagitis: Secondary | ICD-10-CM | POA: Diagnosis not present

## 2018-05-09 DIAGNOSIS — M329 Systemic lupus erythematosus, unspecified: Secondary | ICD-10-CM | POA: Diagnosis not present

## 2018-05-09 DIAGNOSIS — M1990 Unspecified osteoarthritis, unspecified site: Secondary | ICD-10-CM | POA: Diagnosis not present

## 2018-05-09 DIAGNOSIS — I69354 Hemiplegia and hemiparesis following cerebral infarction affecting left non-dominant side: Secondary | ICD-10-CM | POA: Diagnosis not present

## 2018-05-09 DIAGNOSIS — Z4781 Encounter for orthopedic aftercare following surgical amputation: Secondary | ICD-10-CM | POA: Diagnosis not present

## 2018-05-09 DIAGNOSIS — I739 Peripheral vascular disease, unspecified: Secondary | ICD-10-CM | POA: Diagnosis not present

## 2018-05-09 DIAGNOSIS — D6862 Lupus anticoagulant syndrome: Secondary | ICD-10-CM | POA: Diagnosis not present

## 2018-05-09 DIAGNOSIS — S81809A Unspecified open wound, unspecified lower leg, initial encounter: Secondary | ICD-10-CM | POA: Diagnosis not present

## 2018-05-12 DIAGNOSIS — K219 Gastro-esophageal reflux disease without esophagitis: Secondary | ICD-10-CM | POA: Diagnosis not present

## 2018-05-12 DIAGNOSIS — D6862 Lupus anticoagulant syndrome: Secondary | ICD-10-CM | POA: Diagnosis not present

## 2018-05-12 DIAGNOSIS — M1990 Unspecified osteoarthritis, unspecified site: Secondary | ICD-10-CM | POA: Diagnosis not present

## 2018-05-12 DIAGNOSIS — Z89511 Acquired absence of right leg below knee: Secondary | ICD-10-CM | POA: Diagnosis not present

## 2018-05-12 DIAGNOSIS — M329 Systemic lupus erythematosus, unspecified: Secondary | ICD-10-CM | POA: Diagnosis not present

## 2018-05-12 DIAGNOSIS — I10 Essential (primary) hypertension: Secondary | ICD-10-CM | POA: Diagnosis not present

## 2018-05-12 DIAGNOSIS — I739 Peripheral vascular disease, unspecified: Secondary | ICD-10-CM | POA: Diagnosis not present

## 2018-05-12 DIAGNOSIS — Z4781 Encounter for orthopedic aftercare following surgical amputation: Secondary | ICD-10-CM | POA: Diagnosis not present

## 2018-05-12 DIAGNOSIS — I69354 Hemiplegia and hemiparesis following cerebral infarction affecting left non-dominant side: Secondary | ICD-10-CM | POA: Diagnosis not present

## 2018-05-13 ENCOUNTER — Ambulatory Visit: Payer: Medicare HMO | Admitting: Urology

## 2018-05-13 DIAGNOSIS — N401 Enlarged prostate with lower urinary tract symptoms: Secondary | ICD-10-CM | POA: Diagnosis not present

## 2018-05-13 DIAGNOSIS — R350 Frequency of micturition: Secondary | ICD-10-CM

## 2018-05-15 ENCOUNTER — Ambulatory Visit (INDEPENDENT_AMBULATORY_CARE_PROVIDER_SITE_OTHER): Payer: Medicare HMO | Admitting: Vascular Surgery

## 2018-05-15 ENCOUNTER — Encounter: Payer: Self-pay | Admitting: Vascular Surgery

## 2018-05-15 VITALS — BP 126/69 | HR 73 | Resp 20 | Ht 68.0 in | Wt 186.0 lb

## 2018-05-15 DIAGNOSIS — I739 Peripheral vascular disease, unspecified: Secondary | ICD-10-CM

## 2018-05-15 NOTE — Progress Notes (Signed)
Patient is a 77 year old male who returns for follow-up today.  He underwent a right below-knee amputation March 18, 2018.  He has had a slowly healing amputation site.  He has been on one course of antibiotics for cellulitis and this resolved.  He still has several open areas on the BKA.  Pain is improved.  He is doing local wound care on the open areas.  Physical exam:  Vitals:   05/15/18 1352  BP: 126/69  Pulse: 73  Resp: 20  SpO2: 98%  Weight: 186 lb (84.4 kg)  Height: 5\' 8"  (1.727 m)    Right lower extremity: Several open areas lateral and medial corner about 2 cm in length 2 cm in width 1 cm in depth remainder of incision is intact.  There is still some erythema on the posterior flap but this is unchanged.  It is certainly improved from when he was in the hospital.  Assessment: Slowly healing right below-knee amputation still at risk for failure.  Plan: Every other staple was removed today.  He will return in 2 weeks time for recheck of the wound and removal of final staples.  He will continue local wound care with hydrogel.  He will continue home health.  Fabienne Brunsharles Fields, MD [facility] [place] Office: [phone] Pager: [phone]

## 2018-05-16 DIAGNOSIS — M329 Systemic lupus erythematosus, unspecified: Secondary | ICD-10-CM | POA: Diagnosis not present

## 2018-05-16 DIAGNOSIS — Z4781 Encounter for orthopedic aftercare following surgical amputation: Secondary | ICD-10-CM | POA: Diagnosis not present

## 2018-05-16 DIAGNOSIS — Z89511 Acquired absence of right leg below knee: Secondary | ICD-10-CM | POA: Diagnosis not present

## 2018-05-16 DIAGNOSIS — I10 Essential (primary) hypertension: Secondary | ICD-10-CM | POA: Diagnosis not present

## 2018-05-16 DIAGNOSIS — I69354 Hemiplegia and hemiparesis following cerebral infarction affecting left non-dominant side: Secondary | ICD-10-CM | POA: Diagnosis not present

## 2018-05-16 DIAGNOSIS — M1990 Unspecified osteoarthritis, unspecified site: Secondary | ICD-10-CM | POA: Diagnosis not present

## 2018-05-16 DIAGNOSIS — I739 Peripheral vascular disease, unspecified: Secondary | ICD-10-CM | POA: Diagnosis not present

## 2018-05-16 DIAGNOSIS — D6862 Lupus anticoagulant syndrome: Secondary | ICD-10-CM | POA: Diagnosis not present

## 2018-05-16 DIAGNOSIS — K219 Gastro-esophageal reflux disease without esophagitis: Secondary | ICD-10-CM | POA: Diagnosis not present

## 2018-05-19 DIAGNOSIS — D6862 Lupus anticoagulant syndrome: Secondary | ICD-10-CM | POA: Diagnosis not present

## 2018-05-19 DIAGNOSIS — M329 Systemic lupus erythematosus, unspecified: Secondary | ICD-10-CM | POA: Diagnosis not present

## 2018-05-19 DIAGNOSIS — M1990 Unspecified osteoarthritis, unspecified site: Secondary | ICD-10-CM | POA: Diagnosis not present

## 2018-05-19 DIAGNOSIS — I69354 Hemiplegia and hemiparesis following cerebral infarction affecting left non-dominant side: Secondary | ICD-10-CM | POA: Diagnosis not present

## 2018-05-19 DIAGNOSIS — Z4781 Encounter for orthopedic aftercare following surgical amputation: Secondary | ICD-10-CM | POA: Diagnosis not present

## 2018-05-19 DIAGNOSIS — I739 Peripheral vascular disease, unspecified: Secondary | ICD-10-CM | POA: Diagnosis not present

## 2018-05-19 DIAGNOSIS — I10 Essential (primary) hypertension: Secondary | ICD-10-CM | POA: Diagnosis not present

## 2018-05-19 DIAGNOSIS — Z89511 Acquired absence of right leg below knee: Secondary | ICD-10-CM | POA: Diagnosis not present

## 2018-05-19 DIAGNOSIS — K219 Gastro-esophageal reflux disease without esophagitis: Secondary | ICD-10-CM | POA: Diagnosis not present

## 2018-05-23 DIAGNOSIS — I10 Essential (primary) hypertension: Secondary | ICD-10-CM | POA: Diagnosis not present

## 2018-05-23 DIAGNOSIS — I739 Peripheral vascular disease, unspecified: Secondary | ICD-10-CM | POA: Diagnosis not present

## 2018-05-23 DIAGNOSIS — M1990 Unspecified osteoarthritis, unspecified site: Secondary | ICD-10-CM | POA: Diagnosis not present

## 2018-05-23 DIAGNOSIS — M329 Systemic lupus erythematosus, unspecified: Secondary | ICD-10-CM | POA: Diagnosis not present

## 2018-05-23 DIAGNOSIS — Z4781 Encounter for orthopedic aftercare following surgical amputation: Secondary | ICD-10-CM | POA: Diagnosis not present

## 2018-05-23 DIAGNOSIS — I69354 Hemiplegia and hemiparesis following cerebral infarction affecting left non-dominant side: Secondary | ICD-10-CM | POA: Diagnosis not present

## 2018-05-23 DIAGNOSIS — D6862 Lupus anticoagulant syndrome: Secondary | ICD-10-CM | POA: Diagnosis not present

## 2018-05-23 DIAGNOSIS — K219 Gastro-esophageal reflux disease without esophagitis: Secondary | ICD-10-CM | POA: Diagnosis not present

## 2018-05-23 DIAGNOSIS — Z89511 Acquired absence of right leg below knee: Secondary | ICD-10-CM | POA: Diagnosis not present

## 2018-05-26 DIAGNOSIS — I10 Essential (primary) hypertension: Secondary | ICD-10-CM | POA: Diagnosis not present

## 2018-05-26 DIAGNOSIS — I69354 Hemiplegia and hemiparesis following cerebral infarction affecting left non-dominant side: Secondary | ICD-10-CM | POA: Diagnosis not present

## 2018-05-26 DIAGNOSIS — M329 Systemic lupus erythematosus, unspecified: Secondary | ICD-10-CM | POA: Diagnosis not present

## 2018-05-26 DIAGNOSIS — Z89511 Acquired absence of right leg below knee: Secondary | ICD-10-CM | POA: Diagnosis not present

## 2018-05-26 DIAGNOSIS — Z4781 Encounter for orthopedic aftercare following surgical amputation: Secondary | ICD-10-CM | POA: Diagnosis not present

## 2018-05-26 DIAGNOSIS — I739 Peripheral vascular disease, unspecified: Secondary | ICD-10-CM | POA: Diagnosis not present

## 2018-05-26 DIAGNOSIS — K219 Gastro-esophageal reflux disease without esophagitis: Secondary | ICD-10-CM | POA: Diagnosis not present

## 2018-05-26 DIAGNOSIS — M1990 Unspecified osteoarthritis, unspecified site: Secondary | ICD-10-CM | POA: Diagnosis not present

## 2018-05-26 DIAGNOSIS — D6862 Lupus anticoagulant syndrome: Secondary | ICD-10-CM | POA: Diagnosis not present

## 2018-05-28 DIAGNOSIS — I739 Peripheral vascular disease, unspecified: Secondary | ICD-10-CM | POA: Diagnosis not present

## 2018-05-28 DIAGNOSIS — I10 Essential (primary) hypertension: Secondary | ICD-10-CM | POA: Diagnosis not present

## 2018-05-28 DIAGNOSIS — M1990 Unspecified osteoarthritis, unspecified site: Secondary | ICD-10-CM | POA: Diagnosis not present

## 2018-05-28 DIAGNOSIS — D6862 Lupus anticoagulant syndrome: Secondary | ICD-10-CM | POA: Diagnosis not present

## 2018-05-28 DIAGNOSIS — I69354 Hemiplegia and hemiparesis following cerebral infarction affecting left non-dominant side: Secondary | ICD-10-CM | POA: Diagnosis not present

## 2018-05-28 DIAGNOSIS — K219 Gastro-esophageal reflux disease without esophagitis: Secondary | ICD-10-CM | POA: Diagnosis not present

## 2018-05-28 DIAGNOSIS — Z89511 Acquired absence of right leg below knee: Secondary | ICD-10-CM | POA: Diagnosis not present

## 2018-05-28 DIAGNOSIS — M329 Systemic lupus erythematosus, unspecified: Secondary | ICD-10-CM | POA: Diagnosis not present

## 2018-05-28 DIAGNOSIS — Z4781 Encounter for orthopedic aftercare following surgical amputation: Secondary | ICD-10-CM | POA: Diagnosis not present

## 2018-05-29 ENCOUNTER — Other Ambulatory Visit: Payer: Self-pay

## 2018-05-29 ENCOUNTER — Ambulatory Visit: Payer: Medicare HMO | Admitting: Vascular Surgery

## 2018-05-29 ENCOUNTER — Encounter: Payer: Self-pay | Admitting: Vascular Surgery

## 2018-05-29 ENCOUNTER — Ambulatory Visit (INDEPENDENT_AMBULATORY_CARE_PROVIDER_SITE_OTHER): Payer: Self-pay | Admitting: Vascular Surgery

## 2018-05-29 VITALS — BP 124/68 | HR 76 | Temp 98.3°F | Resp 20 | Ht 68.0 in | Wt 186.0 lb

## 2018-05-29 DIAGNOSIS — I739 Peripheral vascular disease, unspecified: Secondary | ICD-10-CM

## 2018-05-29 NOTE — Progress Notes (Signed)
Patient is a 77 year old male who returns for follow-up today.  He previously had a right below-knee amputation June 2019.  He was admitted postoperatively with a cellulitis.  This has now resolved essentially.  The BKA continues to slowly heal.  He has minimal drainage.  He is doing local wound care on the corners of the incision which are slightly separated.  He has no pain in the stump.  Physical exam:  Vitals:   05/29/18 1517  BP: 124/68  Pulse: 76  Resp: 20  Temp: 98.3 F (36.8 C)  TempSrc: Oral  SpO2: 98%  Weight: 186 lb (84.4 kg)  Height: 5\' 8"  (1.727 m)    Right below-knee amputation 1 cm opening on the lateral and medial aspect corner of the incision.  Remainder the incision is intact.  Slight erythema on the posterior flap.  Assessment: Slowly healing right below-knee amputation  Plan: Remainder staples were removed today.  The patient will continue local wound care with hydrogel with home health assistance.  He will follow-up in 1 month.  If the skin has completely healed at that point we will start talking about a shrinker and prosthesis.  Fabienne Brunsharles Olivya Sobol, MD [facility] [place] Office: [phone] Pager: [phone]

## 2018-06-03 DIAGNOSIS — I70203 Unspecified atherosclerosis of native arteries of extremities, bilateral legs: Secondary | ICD-10-CM | POA: Diagnosis not present

## 2018-06-03 DIAGNOSIS — B351 Tinea unguium: Secondary | ICD-10-CM | POA: Diagnosis not present

## 2018-06-04 DIAGNOSIS — Z4781 Encounter for orthopedic aftercare following surgical amputation: Secondary | ICD-10-CM | POA: Diagnosis not present

## 2018-06-04 DIAGNOSIS — M1990 Unspecified osteoarthritis, unspecified site: Secondary | ICD-10-CM | POA: Diagnosis not present

## 2018-06-04 DIAGNOSIS — K219 Gastro-esophageal reflux disease without esophagitis: Secondary | ICD-10-CM | POA: Diagnosis not present

## 2018-06-04 DIAGNOSIS — I739 Peripheral vascular disease, unspecified: Secondary | ICD-10-CM | POA: Diagnosis not present

## 2018-06-04 DIAGNOSIS — D6862 Lupus anticoagulant syndrome: Secondary | ICD-10-CM | POA: Diagnosis not present

## 2018-06-04 DIAGNOSIS — M329 Systemic lupus erythematosus, unspecified: Secondary | ICD-10-CM | POA: Diagnosis not present

## 2018-06-04 DIAGNOSIS — Z89511 Acquired absence of right leg below knee: Secondary | ICD-10-CM | POA: Diagnosis not present

## 2018-06-04 DIAGNOSIS — I10 Essential (primary) hypertension: Secondary | ICD-10-CM | POA: Diagnosis not present

## 2018-06-04 DIAGNOSIS — I69354 Hemiplegia and hemiparesis following cerebral infarction affecting left non-dominant side: Secondary | ICD-10-CM | POA: Diagnosis not present

## 2018-06-09 DIAGNOSIS — S81809A Unspecified open wound, unspecified lower leg, initial encounter: Secondary | ICD-10-CM | POA: Diagnosis not present

## 2018-06-10 DIAGNOSIS — Z4781 Encounter for orthopedic aftercare following surgical amputation: Secondary | ICD-10-CM | POA: Diagnosis not present

## 2018-06-10 DIAGNOSIS — M1990 Unspecified osteoarthritis, unspecified site: Secondary | ICD-10-CM | POA: Diagnosis not present

## 2018-06-10 DIAGNOSIS — I739 Peripheral vascular disease, unspecified: Secondary | ICD-10-CM | POA: Diagnosis not present

## 2018-06-10 DIAGNOSIS — D6862 Lupus anticoagulant syndrome: Secondary | ICD-10-CM | POA: Diagnosis not present

## 2018-06-10 DIAGNOSIS — I69354 Hemiplegia and hemiparesis following cerebral infarction affecting left non-dominant side: Secondary | ICD-10-CM | POA: Diagnosis not present

## 2018-06-10 DIAGNOSIS — Z89511 Acquired absence of right leg below knee: Secondary | ICD-10-CM | POA: Diagnosis not present

## 2018-06-10 DIAGNOSIS — I10 Essential (primary) hypertension: Secondary | ICD-10-CM | POA: Diagnosis not present

## 2018-06-10 DIAGNOSIS — K219 Gastro-esophageal reflux disease without esophagitis: Secondary | ICD-10-CM | POA: Diagnosis not present

## 2018-06-10 DIAGNOSIS — M329 Systemic lupus erythematosus, unspecified: Secondary | ICD-10-CM | POA: Diagnosis not present

## 2018-06-12 DIAGNOSIS — M1611 Unilateral primary osteoarthritis, right hip: Secondary | ICD-10-CM | POA: Diagnosis not present

## 2018-06-25 DIAGNOSIS — I82499 Acute embolism and thrombosis of other specified deep vein of unspecified lower extremity: Secondary | ICD-10-CM | POA: Diagnosis not present

## 2018-06-26 ENCOUNTER — Encounter: Payer: Self-pay | Admitting: Vascular Surgery

## 2018-06-26 ENCOUNTER — Ambulatory Visit (INDEPENDENT_AMBULATORY_CARE_PROVIDER_SITE_OTHER): Payer: Medicare HMO | Admitting: Vascular Surgery

## 2018-06-26 ENCOUNTER — Other Ambulatory Visit: Payer: Self-pay

## 2018-06-26 VITALS — BP 150/71 | HR 71 | Temp 97.0°F | Resp 16 | Ht 68.0 in | Wt 190.0 lb

## 2018-06-26 DIAGNOSIS — I739 Peripheral vascular disease, unspecified: Secondary | ICD-10-CM | POA: Diagnosis not present

## 2018-06-26 NOTE — Progress Notes (Signed)
Patient is a 77 year old male who returns for follow-up today.  He underwent a right below-knee amputation June 2019.  This has been slow to heal.  He states he is no longer having any drainage and he feels that it is healed at this point.  He has no pain in the stump.  Physical exam:  Vitals:   06/26/18 1504  BP: (!) 150/71  Pulse: 71  Resp: 16  Temp: (!) 97 F (36.1 C)  TempSrc: Oral  SpO2: 98%  Weight: 190 lb (86.2 kg)  Height: 5\' 8"  (1.727 m)    Extremities: Right below-knee amputation still some thinned out skin on the lateral aspect but otherwise well-healed.  Assessment: Right below-knee amputation healed at this point.  Plan: Patient be fitted for a shrinker and evaluation for prosthetic.  He will follow-up in 6 months time with ABI on the left leg to make sure he does not have any new problems on the left side.  Fabienne Bruns, MD [facility] [place] Office: [phone] Pager: [phone]

## 2018-06-27 DIAGNOSIS — Z23 Encounter for immunization: Secondary | ICD-10-CM | POA: Diagnosis not present

## 2018-07-01 DIAGNOSIS — Z6828 Body mass index (BMI) 28.0-28.9, adult: Secondary | ICD-10-CM | POA: Diagnosis not present

## 2018-07-01 DIAGNOSIS — M2550 Pain in unspecified joint: Secondary | ICD-10-CM | POA: Diagnosis not present

## 2018-07-01 DIAGNOSIS — R5382 Chronic fatigue, unspecified: Secondary | ICD-10-CM | POA: Diagnosis not present

## 2018-07-01 DIAGNOSIS — M329 Systemic lupus erythematosus, unspecified: Secondary | ICD-10-CM | POA: Diagnosis not present

## 2018-07-01 DIAGNOSIS — M150 Primary generalized (osteo)arthritis: Secondary | ICD-10-CM | POA: Diagnosis not present

## 2018-07-01 DIAGNOSIS — E663 Overweight: Secondary | ICD-10-CM | POA: Diagnosis not present

## 2018-07-01 DIAGNOSIS — D6861 Antiphospholipid syndrome: Secondary | ICD-10-CM | POA: Diagnosis not present

## 2018-07-09 DIAGNOSIS — S81809A Unspecified open wound, unspecified lower leg, initial encounter: Secondary | ICD-10-CM | POA: Diagnosis not present

## 2018-07-09 DIAGNOSIS — I82503 Chronic embolism and thrombosis of unspecified deep veins of lower extremity, bilateral: Secondary | ICD-10-CM | POA: Diagnosis not present

## 2018-07-16 DIAGNOSIS — Z961 Presence of intraocular lens: Secondary | ICD-10-CM | POA: Diagnosis not present

## 2018-07-16 DIAGNOSIS — Z79899 Other long term (current) drug therapy: Secondary | ICD-10-CM | POA: Diagnosis not present

## 2018-07-16 DIAGNOSIS — H353131 Nonexudative age-related macular degeneration, bilateral, early dry stage: Secondary | ICD-10-CM | POA: Diagnosis not present

## 2018-07-16 DIAGNOSIS — M3210 Systemic lupus erythematosus, organ or system involvement unspecified: Secondary | ICD-10-CM | POA: Diagnosis not present

## 2018-07-23 DIAGNOSIS — I82499 Acute embolism and thrombosis of other specified deep vein of unspecified lower extremity: Secondary | ICD-10-CM | POA: Diagnosis not present

## 2018-07-31 DIAGNOSIS — D509 Iron deficiency anemia, unspecified: Secondary | ICD-10-CM | POA: Diagnosis not present

## 2018-07-31 DIAGNOSIS — I82503 Chronic embolism and thrombosis of unspecified deep veins of lower extremity, bilateral: Secondary | ICD-10-CM | POA: Diagnosis not present

## 2018-07-31 DIAGNOSIS — I10 Essential (primary) hypertension: Secondary | ICD-10-CM | POA: Diagnosis not present

## 2018-07-31 DIAGNOSIS — R5383 Other fatigue: Secondary | ICD-10-CM | POA: Diagnosis not present

## 2018-07-31 DIAGNOSIS — N400 Enlarged prostate without lower urinary tract symptoms: Secondary | ICD-10-CM | POA: Diagnosis not present

## 2018-07-31 DIAGNOSIS — K219 Gastro-esophageal reflux disease without esophagitis: Secondary | ICD-10-CM | POA: Diagnosis not present

## 2018-08-04 DIAGNOSIS — Z6831 Body mass index (BMI) 31.0-31.9, adult: Secondary | ICD-10-CM | POA: Diagnosis not present

## 2018-08-04 DIAGNOSIS — N390 Urinary tract infection, site not specified: Secondary | ICD-10-CM | POA: Diagnosis not present

## 2018-08-04 DIAGNOSIS — I82499 Acute embolism and thrombosis of other specified deep vein of unspecified lower extremity: Secondary | ICD-10-CM | POA: Diagnosis not present

## 2018-08-04 DIAGNOSIS — I639 Cerebral infarction, unspecified: Secondary | ICD-10-CM | POA: Diagnosis not present

## 2018-08-04 DIAGNOSIS — D509 Iron deficiency anemia, unspecified: Secondary | ICD-10-CM | POA: Diagnosis not present

## 2018-08-04 DIAGNOSIS — K219 Gastro-esophageal reflux disease without esophagitis: Secondary | ICD-10-CM | POA: Diagnosis not present

## 2018-08-04 DIAGNOSIS — I10 Essential (primary) hypertension: Secondary | ICD-10-CM | POA: Diagnosis not present

## 2018-08-04 DIAGNOSIS — I739 Peripheral vascular disease, unspecified: Secondary | ICD-10-CM | POA: Diagnosis not present

## 2018-08-04 DIAGNOSIS — Z89511 Acquired absence of right leg below knee: Secondary | ICD-10-CM | POA: Diagnosis not present

## 2018-08-09 DIAGNOSIS — S81809A Unspecified open wound, unspecified lower leg, initial encounter: Secondary | ICD-10-CM | POA: Diagnosis not present

## 2018-08-09 DIAGNOSIS — I82499 Acute embolism and thrombosis of other specified deep vein of unspecified lower extremity: Secondary | ICD-10-CM | POA: Diagnosis not present

## 2018-08-09 DIAGNOSIS — N390 Urinary tract infection, site not specified: Secondary | ICD-10-CM | POA: Diagnosis not present

## 2018-08-13 DIAGNOSIS — I82503 Chronic embolism and thrombosis of unspecified deep veins of lower extremity, bilateral: Secondary | ICD-10-CM | POA: Diagnosis not present

## 2018-08-13 DIAGNOSIS — R338 Other retention of urine: Secondary | ICD-10-CM | POA: Diagnosis not present

## 2018-08-13 DIAGNOSIS — N390 Urinary tract infection, site not specified: Secondary | ICD-10-CM | POA: Diagnosis not present

## 2018-08-14 DIAGNOSIS — Z89511 Acquired absence of right leg below knee: Secondary | ICD-10-CM | POA: Diagnosis not present

## 2018-08-19 ENCOUNTER — Ambulatory Visit (INDEPENDENT_AMBULATORY_CARE_PROVIDER_SITE_OTHER): Payer: Medicare HMO | Admitting: Urology

## 2018-08-19 DIAGNOSIS — R3914 Feeling of incomplete bladder emptying: Secondary | ICD-10-CM | POA: Diagnosis not present

## 2018-08-19 DIAGNOSIS — N401 Enlarged prostate with lower urinary tract symptoms: Secondary | ICD-10-CM | POA: Diagnosis not present

## 2018-08-19 DIAGNOSIS — M79673 Pain in unspecified foot: Secondary | ICD-10-CM | POA: Diagnosis not present

## 2018-08-19 DIAGNOSIS — B353 Tinea pedis: Secondary | ICD-10-CM | POA: Diagnosis not present

## 2018-08-20 ENCOUNTER — Ambulatory Visit: Payer: Medicare HMO | Attending: Vascular Surgery | Admitting: Physical Therapy

## 2018-08-20 ENCOUNTER — Other Ambulatory Visit: Payer: Self-pay

## 2018-08-20 DIAGNOSIS — M25661 Stiffness of right knee, not elsewhere classified: Secondary | ICD-10-CM | POA: Diagnosis not present

## 2018-08-20 DIAGNOSIS — R293 Abnormal posture: Secondary | ICD-10-CM | POA: Insufficient documentation

## 2018-08-20 DIAGNOSIS — R2681 Unsteadiness on feet: Secondary | ICD-10-CM | POA: Insufficient documentation

## 2018-08-20 DIAGNOSIS — R2689 Other abnormalities of gait and mobility: Secondary | ICD-10-CM | POA: Diagnosis not present

## 2018-08-20 DIAGNOSIS — M6281 Muscle weakness (generalized): Secondary | ICD-10-CM | POA: Diagnosis not present

## 2018-08-21 NOTE — Therapy (Signed)
[facility] [address] [place], [place], [address] Phone: [phone]   Fax:  [phone]  Physical Therapy Evaluation  Patient Details  Name: Jose Bush MRN: [id number] Date of Birth: [birth year] Referring Provider (PT): [name], MD   Encounter Date: 08/20/2018  PT End of Session - 08/20/18 1800    Visit Number  1    Number of Visits  25    Date for PT Re-Evaluation  11/18/18    Authorization Type  [employer]    Authorization Time Period  $3400oop met, VL follow Medicare guidelines    PT Start Time  1225    PT Stop Time  1320    PT Time Calculation (min)  55 min    Equipment Utilized During Treatment  Gait belt    Activity Tolerance  Patient tolerated treatment well    Behavior During Therapy  WFL for tasks assessed/performed       Past Medical History:  Diagnosis Date  . Arthritis   . Chest pain, unspecified   . DVT (deep venous thrombosis) ([place])   . GERD (gastroesophageal reflux disease)   . Heart murmur    hx of   . History of hiatal hernia   . HTN (hypertension)   . Lupus ([place])   . Lupus ([place])   . Peripheral vascular disease ([facility])    nonviable tissue   . Shingles    hx of   . Stroke [facility]) 2010   'MILD" per pt-  weakness left side  . Tinnitus   . Ulcer of abdomen wall ([place])   . Wears dentures   . Wears glasses     Past Surgical History:  Procedure Laterality Date  . ABDOMINAL AORTOGRAM W/LOWER EXTREMITY N/A 02/17/2018   Procedure: ABDOMINAL AORTOGRAM W/LOWER EXTREMITY;  Surgeon: [name], MD;  Location: [facility];  Service: Cardiovascular;  Laterality: N/A;  . AMPUTATION Right 03/03/2018   Procedure: AMPUTATION RIGHT SECOND TOE;  Surgeon: [name], MD;  Location: [facility];  Service: Vascular;  Laterality: Right;  . AMPUTATION Right 03/18/2018   Procedure: AMPUTATION BELOW KNEE;  Surgeon: [name], MD;  Location: [facility];  Service:  Vascular;  Laterality: Right;  . BELOW KNEE LEG AMPUTATION Right 03/18/2018  . bilateral cataract surgery     . BIOPSY N/A 10/22/2017   Procedure: BIOPSY;  Surgeon: [name], MD;  [facility];  Service: Gastroenterology;  Laterality: N/A;  . COLONOSCOPY N/A 09/18/2016   Dr. [name]: normal  . ESOPHAGOGASTRODUODENOSCOPY N/A 10/22/2017   Dr. [name]: normal duodenum, gastric mucosal atrophy but negative H.pylori (Clotest negative), normal GE junction  . GIVENS CAPSULE STUDY N/A 01/27/2018   Procedure: GIVENS CAPSULE STUDY;  Surgeon: [name], MD;  [facility];  Service: Endoscopy;  Laterality: N/A;  7:00am  . HERNIA REPAIR     2010/laparoscopic/right  . ivc filter     01/2013   . KNEE ARTHROSCOPY Right    2007  . MULTIPLE TOOTH EXTRACTIONS    . PERIPHERAL VASCULAR BALLOON ANGIOPLASTY  02/17/2018   Procedure: PERIPHERAL VASCULAR BALLOON ANGIOPLASTY;  Surgeon: [name], MD;  Location: [facility];  Service: Cardiovascular;;  . TOTAL HIP ARTHROPLASTY Right 05/01/2017   Procedure: RIGHT TOTAL HIP ARTHROPLASTY ANTERIOR APPROACH;  Surgeon: [name], MD;  Location: [facility];  Service: Orthopedics;  Laterality: Right;  . TOTAL KNEE ARTHROPLASTY Left 12/13/2014   Procedure: LEFT TOTAL KNEE ARTHROPLASTY;  Surgeon: [name], MD;  Location: [facility];  Service: Orthopedics;  Laterality: Left;  . TOTAL KNEE ARTHROPLASTY Right 03/21/2015   Procedure: RIGHT TOTAL KNEE ARTHROPLASTY;  Surgeon: [name], MD;  Location: [facility];  Service: Orthopedics;  Laterality: Right;    There were no vitals filed for this visit.   Subjective Assessment - 08/20/18 1227    Subjective  This 77yo male was referred by [name], MD on 08/15/2018 for PT prsothetic evaluation. He underwent a right Transtibial Amputation on 03/18/2018. He was hospitalized 04/02/18-04/11/18 with infection to residual limb. He received his first prosthesis 08/14/2018.     Patient is  accompained by:  Family member   wife, Jose Bush   Pertinent History  R TTA, R THA 05/01/17, R TKA 03/21/15, LTKA 12/13/14, PAD/PVD, OA, lupus, HTN, heart murmur, shingles, mild CVA,     Limitations  Lifting;Standing;Walking;House hold activities    Patient Stated Goals  To use prosthesis to return to yard work & being active in community.    Currently in Pain?  Yes    Pain Score  2    In last week, worst 5/10, best 1-2/10   Pain Location  Neck    Pain Orientation  Posterior;Left;Right    Pain Descriptors / Indicators  Aching    Pain Type  Chronic pain    Pain Onset  More than a month ago    Pain Frequency  Constant    Aggravating Factors   arthritis, using arms overhead esp heavy    Pain Relieving Factors  medication, IcyHot         [facility] PT Assessment - 08/20/18 1230      Assessment   Medical Diagnosis  Right Transtibial Amputation    Referring Provider (PT)  [name], MD    Onset Date/Surgical Date  08/14/18   prosthesis delivery   Hand Dominance  Right    Prior Therapy  HHPT       Precautions   Precautions  Fall      Balance Screen   Has the patient fallen in the past 6 months  No    Has the patient had a decrease in activity level because of a fear of falling?   Yes    Is the patient reluctant to leave their home because of a fear of falling?   No   uses w/c     Home Environment   Living Environment  Private residence    Living Arrangements  Spouse/significant other    Type of [place] entrance   front entrance 2 steps no rails   Home Layout  One level    [place] - 2 wheels;Walker - 4 wheels;Cane - quad;Bedside commode;Tub bench;Grab bars - tub/shower;Wheelchair - Banker      Prior Function   Level of Independence  Independent;Independent with household mobility without device;Independent with community mobility without device    Vocation  Retired    Leisure  yard work      Oncologist Control  Postural limitations    Postural Limitations  Rounded Shoulders;Forward head;Flexed trunk;Weight shift left      ROM / Strength   AROM / PROM / Strength  PROM;Strength      PROM   Overall PROM   Deficits    Overall PROM Comments  Bilateral hip flexion tightness noted in standing    Right Knee Extension  -14  Left Knee Extension  -4      Strength   Overall Strength  Deficits    Overall Strength Comments  RUE WFL, LUE grossly 4/5 residual from CVA per pt    Right Hip Flexion  4/5    Right Hip Extension  3-/5    Right Hip ABduction  3-/5    Left Hip Flexion  4/5    Left Hip Extension  3/5    Left Hip ABduction  4/5    Right/Left Knee  Right;Left    Right Knee Flexion  4/5    Right Knee Extension  3+/5    Left Knee Flexion  4/5    Left Knee Extension  4/5    Right/Left Ankle  Left    Left Ankle Dorsiflexion  4/5    Left Ankle Plantar Flexion  3-/5      Transfers   Transfers  Sit to Stand;Stand to Sit    Sit to Stand  5: Supervision;With upper extremity assist;With armrests;From elevated surface;From chair/3-in-1    Stand to Sit  5: Supervision;With upper extremity assist;To elevated surface;To chair/3-in-1;With armrests      Ambulation/Gait   Ambulation/Gait  Yes    Ambulation/Gait Assistance  5: Supervision    Ambulation/Gait Assistance Details  excessive UE weight bearing on RW with partial weight on prosthesis     Ambulation Distance (Feet)  100 Feet    Assistive device  Rolling walker;Prosthesis    Gait Pattern  Step-to pattern;Decreased step length - left;Decreased stance time - right;Decreased stride length;Decreased hip/knee flexion - right;Decreased weight shift to right;Right hip hike;Right flexed knee in stance;Left flexed knee in stance;Antalgic;Lateral hip instability;Trunk flexed;Abducted- right    Ambulation Surface  Level;Indoor    Gait velocity  0.76 ft/sec      Standardized Balance Assessment   Standardized Balance  Assessment  Berg Balance Test      Berg Balance Test   Sit to Stand  Able to stand using hands after several tries    Standing Unsupported  Able to stand 30 seconds unsupported    Sitting with Back Unsupported but Feet Supported on Floor or Stool  Able to sit safely and securely 2 minutes    Stand to Sit  Controls descent by using hands    Transfers  Able to transfer safely, definite need of hands    Standing Unsupported with Eyes Closed  Needs help to keep from falling    Standing Ubsupported with Feet Together  Needs help to attain position and unable to hold for 15 seconds    From Standing, Reach Forward with Outstretched Arm  Loses balance while trying/requires external support    From Standing Position, Pick up Object from Floor  Unable to try/needs assist to keep balance    From Standing Position, Turn to Look Behind Over each Shoulder  Needs supervision when turning    Turn 360 Degrees  Needs assistance while turning    Standing Unsupported, Alternately Place Feet on Step/Stool  Needs assistance to keep from falling or unable to try    Standing Unsupported, One Foot in [employer] balance while stepping or standing    Standing on One Leg  Tries to lift leg/unable to hold 3 seconds but remains standing independently    Total Score  16      Prosthetics Assessment - 08/20/18 [place] with  Skin check;Residual limb care;Care of non-amputated limb;Prosthetic cleaning;Ply sock cleaning;Correct  ply sock adjustment;Proper wear schedule/adjustment;Proper weight-bearing schedule/adjustment    Donning prosthesis   Min assist    Doffing prosthesis   Supervision    Current prosthetic wear tolerance (days/week)   6 of 6 days since delivery    Current prosthetic wear tolerance (#hours/day)   15 minutes 4x/day prior to PT eval. PT recommended initiating 2 hrs 2x/day with increase q5 days if no issues.     Current prosthetic weight-bearing tolerance  (hours/day)   Pt reports no pain in residual limb or back with standing & gait activities for 5 minutes.     Edema  pitting edema    Residual limb condition   No open areas, dry skin, normal color & temperature               Objective measurements completed on examination: See above findings.      [facility] Adult PT Treatment/Exercise - 08/20/18 1230      Prosthetics   Education Provided  Skin check;Residual limb care;Prosthetic cleaning;Correct ply sock adjustment;Proper Donning;Proper wear schedule/adjustment    Person(s) Educated  Patient;Spouse    Education Method  Explanation;Demonstration;Tactile cues;Verbal cues    Education Method  Verbalized understanding;Returned demonstration;Tactile cues required;Verbal cues required;Needs further instruction               PT Short Term Goals - 08/21/18 1216      PT SHORT TERM GOAL #1   Title  Patient donnes prosthesis correctly & verbalizes proper cleaning. (All STGs Target Dates: 12/202/2019)    Time  4    Period  Weeks    Status  New    Target Date  09/19/18      PT SHORT TERM GOAL #2   Title  Patient tolerates prosthesis wear >8hrs total per day without skin issues.     Time  4    Period  Weeks    Status  New    Target Date  09/19/18      PT SHORT TERM GOAL #3   Title  Patient standing balance without UE support static 2 minutes and reaches 5" without balance loss.     Time  4    Period  Weeks    Status  New    Target Date  09/19/18      PT SHORT TERM GOAL #4   Title  Patient ambulates 300' with rolling walker & prosthesis safely no loss of balance with cues only.     Time  4    Period  Weeks    Status  New    Target Date  09/19/18      PT SHORT TERM GOAL #5   Title  Patient negotiates ramps & curbs with walker and stairs 1 rail/cane with supervision.     Time  4    Period  Weeks    Status  New    Target Date  09/19/18      Additional Short Term Goals   Additional Short Term Goals  Yes      PT  SHORT TERM GOAL #6   Title  Patient ambulates 100' with cane & prosthesis with minA.     Time  4    Period  Weeks    Status  New    Target Date  09/19/18        PT Long Term Goals - 08/20/18 1800      PT LONG TERM GOAL #1   Title  Patient demonstrates & verbalizes understanding of  prosthetic care to enable safe use. (All LTGs Target Date: 11/14/2018)    Time  12    Period  Weeks    Status  New    Target Date  11/14/18      PT LONG TERM GOAL #2   Title  Patient tolerates prosthesis wear >90% of awake hours without skin or limb pain issues to enable function throughout his day.     Time  12    Period  Weeks    Status  New    Target Date  11/14/18      PT LONG TERM GOAL #3   Title  Berg Balance >36/56 to indicate lower fall risk.     Time  12    Period  Weeks    Status  New    Target Date  11/14/18      PT LONG TERM GOAL #4   Title  Patient ambulates 300' outdoors including grass with cane or less and prosthesis modified independent for community mobility.     Time  12    Period  Weeks    Status  New    Target Date  11/14/18      PT LONG TERM GOAL #5   Title  Patient negotiates ramps, curbs & stairs single rail with cane or less & prosthesis modified independent for community access.     Time  12    Period  Weeks    Status  New    Target Date  11/14/18      PT LONG TERM GOAL #6   Title  Patient ambulates with prosthesis only around furniture carrying household items modified independent for household mobility.     Time  12    Period  Weeks    Status  New    Target Date  11/14/18             Plan - 08/20/18 1800    Clinical Impression Statement  This 77yo male underwent a right Transtibial Amputation 03/18/2018 and received prosthesis 08/14/2018. He has history multiple other medical issues including right hip & knee replacements and left knee replacement & CVA with left hemiparesis. He has weakness, endurance & flexibility issues with deconditioning from  prolonged limited mobility following amputation with these medical issues. He is dependent in proper prosthetic care limiting safe use of prosthesis. He has worn prosthesis 6 of 6 days since delivery but only 15 minutes 4 times per day. Limited prosthesis wear limits function, increases fall risk & higher energy expenditure for activities. Berg Balance 16 /56 indicates high fall risk and dependency in standing ADLs. Gait Velocity of 0.76 ft/sec and deviations including excessive UE weight bearing on walker indicate fall risk & impaired gait. Patient would benefit from skilled PT to progress safe function with prosthesis.      History and Personal Factors relevant to plan of care:  R TTA, R THA 05/01/17, R TKA 03/21/15, LTKA 12/13/14, PAD/PVD, OA, lupus, HTN, heart murmur, shingles, mild CVA,     Clinical Presentation  Evolving    Clinical Presentation due to:  high fall risk, dependency in prosthetic care with limited wear, complex medical history: R TTA, R THA 05/01/17, R TKA 03/21/15, LTKA 12/13/14, PAD/PVD, OA, lupus, HTN, heart murmur, shingles, mild CVA    Clinical Decision Making  Moderate    Rehab Potential  Good    PT Frequency  2x / week    PT Duration  12 weeks    PT  Treatment/Interventions  ADLs/Self Care Home Management;Canalith Repostioning;DME Instruction;Gait training;Stair training;Functional mobility training;Therapeutic activities;Therapeutic exercise;Balance training;Neuromuscular re-education;Patient/family education;Prosthetic Training;Dry needling;Vestibular    PT Next Visit Plan  review prosthetic care, instruct in prosthetic gait including ramp, curb & stairs, HEP at sink for midline    Consulted and Agree with Plan of Care  Patient;Family member/caregiver    Family Member Consulted  wife       Patient will benefit from skilled therapeutic intervention in order to improve the following deficits and impairments:  Abnormal gait, Decreased activity tolerance, Decreased balance, Decreased  endurance, Decreased knowledge of use of DME, Decreased mobility, Decreased range of motion, Decreased scar mobility, Decreased strength, Dizziness, Increased edema, Impaired flexibility, Postural dysfunction, Prosthetic Dependency  Visit Diagnosis: Other abnormalities of gait and mobility  Unsteadiness on feet  Abnormal posture  Muscle weakness (generalized)  Stiffness of right knee, not elsewhere classified     Problem List Patient Active Problem List   Diagnosis Date Noted  . Non-healing wound of lower extremity 03/18/2018  . Nonhealing surgical wound 03/12/2018  . PAD (peripheral artery disease) ([place]) 03/03/2018  . PVD (peripheral vascular disease) ([place]) 03/03/2018  . Iron deficiency anemia   . Atrophic gastritis without hemorrhage   . OA (osteoarthritis) of hip 05/01/2017  . OA (osteoarthritis) of knee 12/13/2014  . Other pancytopenia ([place]) 02/04/2013  . Fever 02/02/2013  . Neutropenia ([place]) 02/02/2013  . Weakness 02/01/2013  . Rigors 02/01/2013  . Headache(784.0) 02/01/2013  . Lupus ([place]) 02/01/2013  . Thrombocytopenia ([place]) 02/01/2013  . DVT (deep venous thrombosis) ([place])   . HYPERTENSION, UNSPECIFIED 06/22/2009  . CHEST PAIN-UNSPECIFIED 06/22/2009    Jamey Reas  PT, DPT 08/21/2018, 12:22 PM  [facility] [address] [place], [place], [address] Phone: [phone]   Fax:  [phone]  Name: Jose Bush MRN: [id number] Date of Birth: September 07, 1941

## 2018-08-26 ENCOUNTER — Ambulatory Visit: Payer: Medicare HMO

## 2018-08-26 DIAGNOSIS — R293 Abnormal posture: Secondary | ICD-10-CM

## 2018-08-26 DIAGNOSIS — M6281 Muscle weakness (generalized): Secondary | ICD-10-CM

## 2018-08-26 DIAGNOSIS — M25661 Stiffness of right knee, not elsewhere classified: Secondary | ICD-10-CM | POA: Diagnosis not present

## 2018-08-26 DIAGNOSIS — R2689 Other abnormalities of gait and mobility: Secondary | ICD-10-CM

## 2018-08-26 DIAGNOSIS — R2681 Unsteadiness on feet: Secondary | ICD-10-CM

## 2018-08-26 NOTE — Therapy (Signed)
Jose Bush [address] [place], [place], [address] Phone: [phone]   Fax:  [phone]  Physical Therapy Treatment  Patient Details  Name: Jose Bush MRN: [id number] Date of Birth: Sep 24, 1941 Referring Provider (PT): [name], MD   Encounter Date: 08/26/2018  PT End of Session - 08/26/18 1327    Visit Number  2    Number of Visits  25    Date for PT Re-Evaluation  11/18/18    Authorization Type  [employer]    Authorization Time Period  $[id number] met, VL follow Medicare guidelines    PT Start Time  1315    PT Stop Time  1400    PT Time Calculation (min)  45 min    Equipment Utilized During Treatment  Gait belt    Activity Tolerance  Patient tolerated treatment well    Behavior During Therapy  WFL for tasks assessed/performed       Past Medical History:  Diagnosis Date  . Arthritis   . Chest pain, unspecified   . DVT (deep venous thrombosis) ([place])   . GERD (gastroesophageal reflux disease)   . Heart murmur    hx of   . History of hiatal hernia   . HTN (hypertension)   . Lupus ([place])   . Lupus ([place])   . Peripheral vascular disease (HCC)    nonviable tissue   . Shingles    hx of   . Stroke [facility]) 2010   'MILD" per pt-  weakness left side  . Tinnitus   . Ulcer of abdomen wall ([place])   . Wears dentures   . Wears glasses     Past Surgical History:  Procedure Laterality Date  . ABDOMINAL AORTOGRAM W/LOWER EXTREMITY N/A 02/17/2018   Procedure: ABDOMINAL AORTOGRAM W/LOWER EXTREMITY;  Surgeon: [name], MD;  Location: [place] [facility];  Service: Cardiovascular;  Laterality: N/A;  . AMPUTATION Right 03/03/2018   Procedure: AMPUTATION RIGHT SECOND TOE;  Surgeon: [name], MD;  Location: [place];  Service: Vascular;  Laterality: Right;  . AMPUTATION Right 03/18/2018   Procedure: AMPUTATION BELOW KNEE;  Surgeon: [name], MD;  Location: [place];  Service: Vascular;   Laterality: Right;  . BELOW KNEE LEG AMPUTATION Right 03/18/2018  . bilateral cataract surgery     . BIOPSY N/A 10/22/2017   Procedure: BIOPSY;  Surgeon: [name], MD;  [facility];  Service: Gastroenterology;  Laterality: N/A;  . COLONOSCOPY N/A 09/18/2016   Dr. [name]: normal  . ESOPHAGOGASTRODUODENOSCOPY N/A 10/22/2017   Dr. [name]: normal duodenum, gastric mucosal atrophy but negative H.pylori (Clotest negative), normal GE junction  . GIVENS CAPSULE STUDY N/A 01/27/2018   Procedure: GIVENS CAPSULE STUDY;  Surgeon: [name], MD;  [facility];  Service: Endoscopy;  Laterality: N/A;  7:00am  . HERNIA REPAIR     2010/laparoscopic/right  . ivc filter     01/2013   . KNEE ARTHROSCOPY Right    2007  . MULTIPLE TOOTH EXTRACTIONS    . PERIPHERAL VASCULAR BALLOON ANGIOPLASTY  02/17/2018   Procedure: PERIPHERAL VASCULAR BALLOON ANGIOPLASTY;  Surgeon: [name], MD;  Location: [place] [facility];  Service: Cardiovascular;;  . TOTAL HIP ARTHROPLASTY Right 05/01/2017   Procedure: RIGHT TOTAL HIP ARTHROPLASTY ANTERIOR APPROACH;  Surgeon: [name], MD;  [facility];  Service: Orthopedics;  Laterality: Right;  . TOTAL KNEE ARTHROPLASTY Left 12/13/2014   Procedure: LEFT TOTAL KNEE ARTHROPLASTY;  Surgeon: [name], MD;  [facility];  Service: Orthopedics;  Laterality: Left;  . TOTAL KNEE ARTHROPLASTY Right 03/21/2015   Procedure: RIGHT TOTAL KNEE ARTHROPLASTY;  Surgeon: [name], MD;  [facility];  Service: Orthopedics;  Laterality: Right;    There were no vitals filed for this visit.  Subjective Assessment - 08/26/18 1322    Subjective  Pt feels unstable when he walks, ankle feel as though it wants to turn out. No falls to report.     Patient is accompained by:  Family member   Wife   Pertinent History  R TTA, R THA 05/01/17, R TKA 03/21/15, LTKA 12/13/14, PAD/PVD, OA, lupus, HTN, heart murmur, shingles, mild CVA,      Limitations  Lifting;Standing;Walking;House hold activities    Patient Stated Goals  To use prosthesis to return to yard work & being active in community.    Currently in Pain?  No/denies        [facility] Adult PT Treatment/Exercise - 08/26/18 1327      Ambulation/Gait   Ambulation/Gait  Yes    Ambulation/Gait Assistance  5: Supervision    Ambulation/Gait Assistance Details  Pts prosthesis continued to slide in shoe, therapist tightened shoe with some improvement. Therapist educated pt on proper shoe wear with prosthesis. VC's on posture, to transfer weightshift to BLE's rather than BUE's during ambulation.     Ambulation Distance (Feet)  115 Feet    Assistive device  Rolling walker;Prosthesis    Gait Pattern  Step-to pattern;Decreased step length - left;Decreased stance time - right;Decreased stride length;Decreased hip/knee flexion - right;Decreased weight shift to right;Right hip hike;Right flexed knee in stance;Left flexed knee in stance;Antalgic;Lateral hip instability;Trunk flexed;Abducted- right    Ambulation Surface  Level;Indoor             PT Education - 08/26/18 2201    Education Details  Initiated HEP at sink.     Person(s) Educated  Patient;Spouse    Methods  Explanation;Demonstration;Verbal cues;Tactile cues;Handout    Comprehension  Verbalized understanding;Returned demonstration;Verbal cues required;Tactile cues required;Need further instruction       PT Short Term Goals - 08/21/18 1216      PT SHORT TERM GOAL #1   Title  Patient donnes prosthesis correctly & verbalizes proper cleaning. (All STGs Target Dates: 12/202/2019)    Time  4    Period  Weeks    Status  New    Target Date  09/19/18      PT SHORT TERM GOAL #2   Title  Patient tolerates prosthesis wear >8hrs total per day without skin issues.     Time  4    Period  Weeks    Status  New    Target Date  09/19/18      PT SHORT TERM GOAL #3   Title  Patient standing balance without UE support static 2  minutes and reaches 5" without balance loss.     Time  4    Period  Weeks    Status  New    Target Date  09/19/18      PT SHORT TERM GOAL #4   Title  Patient ambulates 300' with rolling walker & prosthesis safely no loss of balance with cues only.     Time  4    Period  Weeks    Status  New    Target Date  09/19/18      PT SHORT TERM GOAL #5   Title  Patient  negotiates ramps & curbs with walker and stairs 1 rail/cane with supervision.     Time  4    Period  Weeks    Status  New    Target Date  09/19/18      Additional Short Term Goals   Additional Short Term Goals  Yes      PT SHORT TERM GOAL #6   Title  Patient ambulates 100' with cane & prosthesis with minA.     Time  4    Period  Weeks    Status  New    Target Date  09/19/18        PT Long Term Goals - 08/20/18 1800      PT LONG TERM GOAL #1   Title  Patient demonstrates & verbalizes understanding of prosthetic care to enable safe use. (All LTGs Target Date: 11/14/2018)    Time  12    Period  Weeks    Status  New    Target Date  11/14/18      PT LONG TERM GOAL #2   Title  Patient tolerates prosthesis wear >90% of awake hours without skin or limb pain issues to enable function throughout his day.     Time  12    Period  Weeks    Status  New    Target Date  11/14/18      PT LONG TERM GOAL #3   Title  Berg Balance >36/56 to indicate lower fall risk.     Time  12    Period  Weeks    Status  New    Target Date  11/14/18      PT LONG TERM GOAL #4   Title  Patient ambulates 300' outdoors including grass with cane or less and prosthesis modified independent for community mobility.     Time  12    Period  Weeks    Status  New    Target Date  11/14/18      PT LONG TERM GOAL #5   Title  Patient negotiates ramps, curbs & stairs single rail with cane or less & prosthesis modified independent for community access.     Time  12    Period  Weeks    Status  New    Target Date  11/14/18      PT LONG TERM GOAL #6    Title  Patient ambulates with prosthesis only around furniture carrying household items modified independent for household mobility.     Time  12    Period  Weeks    Status  New    Target Date  11/14/18            Plan - 08/26/18 2203    Clinical Impression Statement  Todays skilled session focused on initiating HEP at sink with instruction on proper technique/safety, and gait training with RW. Pts prosthesis continued to slide in shoe during ambulation, therapist provided education on the importance of proper shoe wear. Pt should benefit from continued PT sessions to progress towards goals.     Rehab Potential  Good    PT Frequency  2x / week    PT Duration  12 weeks    PT Treatment/Interventions  ADLs/Self Care Home Management;Canalith Repostioning;DME Instruction;Gait training;Stair training;Functional mobility training;Therapeutic activities;Therapeutic exercise;Balance training;Neuromuscular re-education;Patient/family education;Prosthetic Training;Dry needling;Vestibular    PT Next Visit Plan  review prosthetic care, instruct in prosthetic gait including ramp, curb & stairs.    Consulted and Agree with  Plan of Care  Patient;Family member/caregiver    Family Member Consulted  wife       Patient will benefit from skilled therapeutic intervention in order to improve the following deficits and impairments:  Abnormal gait, Decreased activity tolerance, Decreased balance, Decreased endurance, Decreased knowledge of use of DME, Decreased mobility, Decreased range of motion, Decreased scar mobility, Decreased strength, Dizziness, Increased edema, Impaired flexibility, Postural dysfunction, Prosthetic Dependency  Visit Diagnosis: Other abnormalities of gait and mobility  Unsteadiness on feet  Abnormal posture  Muscle weakness (generalized)     Problem List Patient Active Problem List   Diagnosis Date Noted  . Non-healing wound of lower extremity 03/18/2018  . Nonhealing  surgical wound 03/12/2018  . PAD (peripheral artery disease) ([place]) 03/03/2018  . PVD (peripheral vascular disease) ([place]) 03/03/2018  . Iron deficiency anemia   . Atrophic gastritis without hemorrhage   . OA (osteoarthritis) of hip 05/01/2017  . OA (osteoarthritis) of knee 12/13/2014  . Other pancytopenia ([place]) 02/04/2013  . Fever 02/02/2013  . Neutropenia ([place]) 02/02/2013  . Weakness 02/01/2013  . Rigors 02/01/2013  . Headache(784.0) 02/01/2013  . Lupus ([place]) 02/01/2013  . Thrombocytopenia ([place]) 02/01/2013  . DVT (deep venous thrombosis) ([place])   . HYPERTENSION, UNSPECIFIED 06/22/2009  . CHEST PAIN-UNSPECIFIED 06/22/2009    , PTA   A  08/26/2018, 10:07 PM  [facility] [address] [place], [place], [address] Phone: [phone]   Fax:  [phone]  Name: BEAUX WEDEMEYER MRN: [id number] Date of Birth: 08-30-1941

## 2018-08-26 NOTE — Patient Instructions (Signed)

## 2018-09-02 ENCOUNTER — Ambulatory Visit: Payer: Medicare HMO | Attending: Vascular Surgery

## 2018-09-02 DIAGNOSIS — R2681 Unsteadiness on feet: Secondary | ICD-10-CM | POA: Diagnosis not present

## 2018-09-02 DIAGNOSIS — R2689 Other abnormalities of gait and mobility: Secondary | ICD-10-CM | POA: Diagnosis not present

## 2018-09-02 DIAGNOSIS — R293 Abnormal posture: Secondary | ICD-10-CM | POA: Insufficient documentation

## 2018-09-02 DIAGNOSIS — M6281 Muscle weakness (generalized): Secondary | ICD-10-CM | POA: Diagnosis not present

## 2018-09-02 DIAGNOSIS — M25661 Stiffness of right knee, not elsewhere classified: Secondary | ICD-10-CM | POA: Diagnosis not present

## 2018-09-02 NOTE — Therapy (Signed)
[facility] [address] [place], [place], [address] Phone: [phone]   Fax:  [phone]  Physical Therapy Treatment  Patient Details  Name: Jose Bush MRN: [id number] Date of Birth: 1941-01-19 Referring Provider (PT): [name], MD   Encounter Date: 09/02/2018  PT End of Session - 09/02/18 1152    Visit Number  3    Number of Visits  25    Date for PT Re-Evaluation  11/18/18    Authorization Type  [employer]    Authorization Time Period  $[id number] met, VL follow Medicare guidelines    PT Start Time  1148    PT Stop Time  1237    PT Time Calculation (min)  49 min    Equipment Utilized During Treatment  Gait belt    Activity Tolerance  Patient tolerated treatment well    Behavior During Therapy  WFL for tasks assessed/performed       Past Medical History:  Diagnosis Date  . Arthritis   . Chest pain, unspecified   . DVT (deep venous thrombosis) ([place])   . GERD (gastroesophageal reflux disease)   . Heart murmur    hx of   . History of hiatal hernia   . HTN (hypertension)   . Lupus ([place])   . Lupus ([place])   . Peripheral vascular disease (HCC)    nonviable tissue   . Shingles    hx of   . Stroke [facility]) 2010   'MILD" per pt-  weakness left side  . Tinnitus   . Ulcer of abdomen wall ([place])   . Wears dentures   . Wears glasses     Past Surgical History:  Procedure Laterality Date  . ABDOMINAL AORTOGRAM W/LOWER EXTREMITY N/A 02/17/2018   Procedure: ABDOMINAL AORTOGRAM W/LOWER EXTREMITY;  Surgeon: [name], MD;  Location: [place] [facility];  Service: Cardiovascular;  Laterality: N/A;  . AMPUTATION Right 03/03/2018   Procedure: AMPUTATION RIGHT SECOND TOE;  Surgeon: [name], MD;  Location: [place];  Service: Vascular;  Laterality: Right;  . AMPUTATION Right 03/18/2018   Procedure: AMPUTATION BELOW KNEE;  Surgeon: [name], MD;  Location: [place];  Service: Vascular;   Laterality: Right;  . BELOW KNEE LEG AMPUTATION Right 03/18/2018  . bilateral cataract surgery     . BIOPSY N/A 10/22/2017   Procedure: BIOPSY;  Surgeon: [name], MD;  [facility];  Service: Gastroenterology;  Laterality: N/A;  . COLONOSCOPY N/A 09/18/2016   Dr. [name]: normal  . ESOPHAGOGASTRODUODENOSCOPY N/A 10/22/2017   Dr. [name]: normal duodenum, gastric mucosal atrophy but negative H.pylori (Clotest negative), normal GE junction  . GIVENS CAPSULE STUDY N/A 01/27/2018   Procedure: GIVENS CAPSULE STUDY;  Surgeon: [name], MD;  [facility];  Service: Endoscopy;  Laterality: N/A;  7:00am  . HERNIA REPAIR     2010/laparoscopic/right  . ivc filter     01/2013   . KNEE ARTHROSCOPY Right    2007  . MULTIPLE TOOTH EXTRACTIONS    . PERIPHERAL VASCULAR BALLOON ANGIOPLASTY  02/17/2018   Procedure: PERIPHERAL VASCULAR BALLOON ANGIOPLASTY;  Surgeon: [name], MD;  Location: [place] [facility];  Service: Cardiovascular;;  . TOTAL HIP ARTHROPLASTY Right 05/01/2017   Procedure: RIGHT TOTAL HIP ARTHROPLASTY ANTERIOR APPROACH;  Surgeon: [name], MD;  [facility];  Service: Orthopedics;  Laterality: Right;  . TOTAL KNEE ARTHROPLASTY Left 12/13/2014   Procedure: LEFT TOTAL KNEE ARTHROPLASTY;  Surgeon: [name], MD;  [facility];  Service: Orthopedics;  Laterality: Left;  . TOTAL KNEE ARTHROPLASTY Right 03/21/2015   Procedure: RIGHT TOTAL KNEE ARTHROPLASTY;  Surgeon: [name], MD;  [facility];  Service: Orthopedics;  Laterality: Right;    There were no vitals filed for this visit.  Subjective Assessment - 09/02/18 1150    Subjective  No falls to report, HEP is going well. Pt reports feeling some pain yesterday evening in RLE after increased ambulation.     Patient is accompained by:  Family member    Pertinent History  R TTA, R THA 05/01/17, R TKA 03/21/15, LTKA 12/13/14, PAD/PVD, OA, lupus, HTN, heart murmur, shingles,  mild CVA,     Limitations  Lifting;Standing;Walking;House hold activities    Patient Stated Goals  To use prosthesis to return to yard work & being active in community.    Currently in Pain?  No/denies       [facility] Adult PT Treatment/Exercise - 09/02/18 1153      Ambulation/Gait   Ambulation/Gait  Yes    Ambulation/Gait Assistance  5: Supervision    Ambulation Distance (Feet)  230 Feet    Assistive device  Rollator;Prosthesis    Gait Pattern  Step-to pattern;Decreased step length - left;Decreased stance time - right;Decreased stride length;Decreased hip/knee flexion - right;Decreased weight shift to right;Right hip hike;Right flexed knee in stance;Left flexed knee in stance;Antalgic;Lateral hip instability;Trunk flexed;Abducted- right    Ambulation Surface  Level;Indoor    Ramp  5: Supervision;4: Min assist    Ramp Details (indicate cue type and reason)  Min assist progressing to Supervision, VC's for initial technique, sequence with AD, weight shift and posture.     Curb  5: Supervision;4: Min assist    Curb Details (indicate cue type and reason)  Min assist progressing to Supervision with VC's for proper technique with rollator, foot clearance and BLE sequence.     Gait Comments  Pt in parallel bars gait training with no UE support       Neuro Re-ed    Neuro Re-ed Details   Engaged pt in static/dynamic standing balance with SUE support required reaching up/across midline while also working on activity tolerance standing for x29mns with no rest break and minimal pain in LE towards end of task.       Prosthetics   Prosthetic Care Comments   Increased pts wear time to 5hrsx2    Current prosthetic wear tolerance (days/week)   7days/wk    Current prosthetic wear tolerance (#hours/day)   4hrs/2xday 2hr break    Residual limb condition   No issues    Education Provided  Skin check;Proper wear schedule/adjustment;Residual limb care    Person(s) Educated  Patient;Spouse    Education Method   Explanation;Verbal cues    Education Method  Verbalized understanding;Verbal cues required;Needs further instruction        PT Short Term Goals - 08/21/18 1216      PT SHORT TERM GOAL #1   Title  Patient donnes prosthesis correctly & verbalizes proper cleaning. (All STGs Target Dates: 12/202/2019)    Time  4    Period  Weeks    Status  New    Target Date  09/19/18      PT SHORT TERM GOAL #2   Title  Patient tolerates prosthesis wear >8hrs total per day without skin issues.     Time  4    Period  Weeks    Status  New  Target Date  09/19/18      PT SHORT TERM GOAL #3   Title  Patient standing balance without UE support static 2 minutes and reaches 5" without balance loss.     Time  4    Period  Weeks    Status  New    Target Date  09/19/18      PT SHORT TERM GOAL #4   Title  Patient ambulates 300' with rolling walker & prosthesis safely no loss of balance with cues only.     Time  4    Period  Weeks    Status  New    Target Date  09/19/18      PT SHORT TERM GOAL #5   Title  Patient negotiates ramps & curbs with walker and stairs 1 rail/cane with supervision.     Time  4    Period  Weeks    Status  New    Target Date  09/19/18      Additional Short Term Goals   Additional Short Term Goals  Yes      PT SHORT TERM GOAL #6   Title  Patient ambulates 100' with cane & prosthesis with minA.     Time  4    Period  Weeks    Status  New    Target Date  09/19/18        PT Long Term Goals - 08/20/18 1800      PT LONG TERM GOAL #1   Title  Patient demonstrates & verbalizes understanding of prosthetic care to enable safe use. (All LTGs Target Date: 11/14/2018)    Time  12    Period  Weeks    Status  New    Target Date  11/14/18      PT LONG TERM GOAL #2   Title  Patient tolerates prosthesis wear >90% of awake hours without skin or limb pain issues to enable function throughout his day.     Time  12    Period  Weeks    Status  New    Target Date  11/14/18       PT LONG TERM GOAL #3   Title  Berg Balance >36/56 to indicate lower fall risk.     Time  12    Period  Weeks    Status  New    Target Date  11/14/18      PT LONG TERM GOAL #4   Title  Patient ambulates 300' outdoors including grass with cane or less and prosthesis modified independent for community mobility.     Time  12    Period  Weeks    Status  New    Target Date  11/14/18      PT LONG TERM GOAL #5   Title  Patient negotiates ramps, curbs & stairs single rail with cane or less & prosthesis modified independent for community access.     Time  12    Period  Weeks    Status  New    Target Date  11/14/18      PT LONG TERM GOAL #6   Title  Patient ambulates with prosthesis only around furniture carrying household items modified independent for household mobility.     Time  12    Period  Weeks    Status  New    Target Date  11/14/18            Plan - 09/02/18 2026  Clinical Impression Statement  Todays skilled session focused on prosthetic gait training with no AD in parallel bars with manual facilitation on Bil hips to facilitate proper weight shift, ramp/curb training with rollator, prosthetic care and static/dynamic standing balance while increasing activity tolerance with x2mn stand and SUE support. [name] added insert in pts shoe as suggested by therapist last visit and presented with a decrease in movement of prosthesis inside shoe. Pt is making progress and should benefit from continued PT sessions to progress towards goals.     Rehab Potential  Good    PT Frequency  2x / week    PT Duration  12 weeks    PT Treatment/Interventions  ADLs/Self Care Home Management;Canalith Repostioning;DME Instruction;Gait training;Stair training;Functional mobility training;Therapeutic activities;Therapeutic exercise;Balance training;Neuromuscular re-education;Patient/family education;Prosthetic Training;Dry needling;Vestibular    PT Next Visit Plan  review prosthetic care,  continue prosthetic gait including ramp, curb & stairs, dynamic balance.     Consulted and Agree with Plan of Care  Patient;Family member/caregiver    Family Member Consulted  wife       Patient will benefit from skilled therapeutic intervention in order to improve the following deficits and impairments:  Abnormal gait, Decreased activity tolerance, Decreased balance, Decreased endurance, Decreased knowledge of use of DME, Decreased mobility, Decreased range of motion, Decreased scar mobility, Decreased strength, Dizziness, Increased edema, Impaired flexibility, Postural dysfunction, Prosthetic Dependency  Visit Diagnosis: Other abnormalities of gait and mobility  Unsteadiness on feet  Abnormal posture  Muscle weakness (generalized)     Problem List Patient Active Problem List   Diagnosis Date Noted  . Non-healing wound of lower extremity 03/18/2018  . Nonhealing surgical wound 03/12/2018  . P[place] (peripheral artery disease) ([place] 03/03/2018  . PVD (peripheral vascular disease) ([place] 03/03/2018  . Iron deficiency anemia   . Atrophic gastritis without hemorrhage   . OA (osteoarthritis) of hip 05/01/2017  . OA (osteoarthritis) of knee 12/13/2014  . Other pancytopenia ([place] 02/04/2013  . Fever 02/02/2013  . Neutropenia ([place] 02/02/2013  . Weakness 02/01/2013  . Rigors 02/01/2013  . Headache(784.0) 02/01/2013  . Lupus ([place] 02/01/2013  . Thrombocytopenia ([place] 02/01/2013  . DVT (deep venous thrombosis) ([place]   . HYPERTENSION, UNSPECIFIED 06/22/2009  . CHEST PAIN-UNSPECIFIED 06/22/2009   Degan Hanser, PTA  [place] Raske A Saquoia Sianez 09/02/2018, 8:33 PM  [place] [place] [address]Phone: [phone]  Fax:  [phone] Name: Jose SUNDEMRN: [id number] of Birth: [birth year]

## 2018-09-05 ENCOUNTER — Ambulatory Visit: Payer: Medicare HMO

## 2018-09-05 DIAGNOSIS — R2681 Unsteadiness on feet: Secondary | ICD-10-CM

## 2018-09-05 DIAGNOSIS — M6281 Muscle weakness (generalized): Secondary | ICD-10-CM

## 2018-09-05 DIAGNOSIS — M25661 Stiffness of right knee, not elsewhere classified: Secondary | ICD-10-CM | POA: Diagnosis not present

## 2018-09-05 DIAGNOSIS — R2689 Other abnormalities of gait and mobility: Secondary | ICD-10-CM | POA: Diagnosis not present

## 2018-09-05 DIAGNOSIS — R293 Abnormal posture: Secondary | ICD-10-CM

## 2018-09-05 NOTE — Therapy (Signed)
[facility] [address] [place], [place], [address] Phone: [phone]   Fax:  [phone]  Physical Therapy Treatment  Patient Details  Name: Jose Bush MRN: [id number] Date of Birth: [birth year] Referring Provider (PT): [name], MD   Encounter Date: 09/05/2018  PT End of Session - 09/05/18 1101    Visit Number  4    Number of Visits  25    Date for PT Re-Evaluation  11/18/18    Authorization Type  [employer]    Authorization Time Period  $[id number] met, VL follow Medicare guidelines    PT Start Time  1050    PT Stop Time  1145    PT Time Calculation (min)  55 min    Equipment Utilized During Treatment  Gait belt    Activity Tolerance  Patient tolerated treatment well    Behavior During Therapy  WFL for tasks assessed/performed       Past Medical History:  Diagnosis Date  . Arthritis   . Chest pain, unspecified   . DVT (deep venous thrombosis) ([place])   . GERD (gastroesophageal reflux disease)   . Heart murmur    hx of   . History of hiatal hernia   . HTN (hypertension)   . Lupus ([place])   . Lupus ([place])   . Peripheral vascular disease (HCC)    nonviable tissue   . Shingles    hx of   . Stroke [facility]) 2010   'MILD" per pt-  weakness left side  . Tinnitus   . Ulcer of abdomen wall ([place])   . Wears dentures   . Wears glasses     Past Surgical History:  Procedure Laterality Date  . ABDOMINAL AORTOGRAM W/LOWER EXTREMITY N/A 02/17/2018   Procedure: ABDOMINAL AORTOGRAM W/LOWER EXTREMITY;  Surgeon: [name], MD;  Location: [facility];  Service: Cardiovascular;  Laterality: N/A;  . AMPUTATION Right 03/03/2018   Procedure: AMPUTATION RIGHT SECOND TOE;  Surgeon: [name], MD;  Location: [facility];  Service: Vascular;  Laterality: Right;  . AMPUTATION Right 03/18/2018   Procedure: AMPUTATION BELOW KNEE;  Surgeon: [name], MD;  Location: [facility];  Service: Vascular;   Laterality: Right;  . BELOW KNEE LEG AMPUTATION Right 03/18/2018  . bilateral cataract surgery     . BIOPSY N/A 10/22/2017   Procedure: BIOPSY;  Surgeon: [name], MD;  [facility];  Service: Gastroenterology;  Laterality: N/A;  . COLONOSCOPY N/A 09/18/2016   Dr. [name]: normal  . ESOPHAGOGASTRODUODENOSCOPY N/A 10/22/2017   Dr. [name]: normal duodenum, gastric mucosal atrophy but negative H.pylori (Clotest negative), normal GE junction  . GIVENS CAPSULE STUDY N/A 01/27/2018   Procedure: GIVENS CAPSULE STUDY;  Surgeon: [name], MD;  [facility];  Service: Endoscopy;  Laterality: N/A;  7:00am  . HERNIA REPAIR     2010/laparoscopic/right  . ivc filter     01/2013   . KNEE ARTHROSCOPY Right    2007  . MULTIPLE TOOTH EXTRACTIONS    . PERIPHERAL VASCULAR BALLOON ANGIOPLASTY  02/17/2018   Procedure: PERIPHERAL VASCULAR BALLOON ANGIOPLASTY;  Surgeon: [name], MD;  Location: [facility];  Service: Cardiovascular;;  . TOTAL HIP ARTHROPLASTY Right 05/01/2017   Procedure: RIGHT TOTAL HIP ARTHROPLASTY ANTERIOR APPROACH;  Surgeon: [name], MD;  [facility];  Service: Orthopedics;  Laterality: Right;  . TOTAL KNEE ARTHROPLASTY Left 12/13/2014   Procedure: LEFT TOTAL KNEE ARTHROPLASTY;  Surgeon: [name], MD;  [facility];  Service: Orthopedics;  Laterality: Left;  . TOTAL KNEE ARTHROPLASTY Right 03/21/2015   Procedure: RIGHT TOTAL KNEE ARTHROPLASTY;  Surgeon: [name], MD;  [facility];  Service: Orthopedics;  Laterality: Right;    There were no vitals filed for this visit.  Subjective Assessment - 09/05/18 1058    Subjective  HEP is going well, no falls to report.     Patient is accompained by:  Family member   Wife   Pertinent History  R TTA, R THA 05/01/17, R TKA 03/21/15, LTKA 12/13/14, PAD/PVD, OA, lupus, HTN, heart murmur, shingles, mild CVA,     Limitations  Lifting;Standing;Walking;House hold activities     Patient Stated Goals  To use prosthesis to return to yard work & being active in community.    Currently in Pain?  No/denies       [facility] Adult PT Treatment/Exercise - 09/05/18 1153      Ambulation/Gait   Ambulation/Gait  Yes    Ambulation/Gait Assistance  4: Min guard;4: Min assist    Ambulation/Gait Assistance Details  VC's for step length, pacing and weightshift.     Ambulation Distance (Feet)  115 Feet   x2   Assistive device  Large base quad cane;Prosthesis    Gait Pattern  Step-to pattern;Decreased step length - left;Decreased stance time - right;Decreased stride length;Decreased hip/knee flexion - right;Decreased weight shift to right;Right hip hike;Right flexed knee in stance;Left flexed knee in stance;Antalgic;Lateral hip instability;Trunk flexed;Abducted- right    Ambulation Surface  Level;Indoor      Exercises   Exercises  Knee/Hip      Knee/Hip Exercises: Aerobic   Nustep  L3 36mns      Prosthetics   Prosthetic Care Comments   Pt had issues with proper sock adjustment, educated pt on proper sock adjustment, cutting sock to add more support only in areas needed.     Education Provided  Correct ply sock adjustment    Person(s) Educated  Patient;Spouse    Education Method  Explanation;Demonstration;Verbal cues;Tactile cues    Education Method  Verbalized understanding;Needs further instruction;Tactile cues required;Verbal cues required          PT Short Term Goals - 08/21/18 1216      PT SHORT TERM GOAL #1   Title  Patient donnes prosthesis correctly & verbalizes proper cleaning. (All STGs Target Dates: 12/202/2019)    Time  4    Period  Weeks    Status  New    Target Date  09/19/18      PT SHORT TERM GOAL #2   Title  Patient tolerates prosthesis wear >8hrs total per day without skin issues.     Time  4    Period  Weeks    Status  New    Target Date  09/19/18      PT SHORT TERM GOAL #3   Title  Patient standing balance without UE support static 2 minutes and  reaches 5" without balance loss.     Time  4    Period  Weeks    Status  New    Target Date  09/19/18      PT SHORT TERM GOAL #4   Title  Patient ambulates 300' with rolling walker & prosthesis safely no loss of balance with cues only.     Time  4    Period  Weeks    Status  New    Target Date  09/19/18  PT SHORT TERM GOAL #5   Title  Patient negotiates ramps & curbs with walker and stairs 1 rail/cane with supervision.     Time  4    Period  Weeks    Status  New    Target Date  09/19/18      Additional Short Term Goals   Additional Short Term Goals  Yes      PT SHORT TERM GOAL #6   Title  Patient ambulates 100' with cane & prosthesis with minA.     Time  4    Period  Weeks    Status  New    Target Date  09/19/18        PT Long Term Goals - 08/20/18 1800      PT LONG TERM GOAL #1   Title  Patient demonstrates & verbalizes understanding of prosthetic care to enable safe use. (All LTGs Target Date: 11/14/2018)    Time  12    Period  Weeks    Status  New    Target Date  11/14/18      PT LONG TERM GOAL #2   Title  Patient tolerates prosthesis wear >90% of awake hours without skin or limb pain issues to enable function throughout his day.     Time  12    Period  Weeks    Status  New    Target Date  11/14/18      PT LONG TERM GOAL #3   Title  Berg Balance >36/56 to indicate lower fall risk.     Time  12    Period  Weeks    Status  New    Target Date  11/14/18      PT LONG TERM GOAL #4   Title  Patient ambulates 300' outdoors including grass with cane or less and prosthesis modified independent for community mobility.     Time  12    Period  Weeks    Status  New    Target Date  11/14/18      PT LONG TERM GOAL #5   Title  Patient negotiates ramps, curbs & stairs single rail with cane or less & prosthesis modified independent for community access.     Time  12    Period  Weeks    Status  New    Target Date  11/14/18      PT LONG TERM GOAL #6   Title   Patient ambulates with prosthesis only around furniture carrying household items modified independent for household mobility.     Time  12    Period  Weeks    Status  New    Target Date  11/14/18            Plan - 09/05/18 1416    Clinical Impression Statement  Todays skilled session focused on prosthetic gait with [facility] with min guard/assist, Nustep for BLE strengthening & safe transfer to use stationary bike at home, and prosthetic care education for proper sock adjustment. Pt should benefit from continued PT sessions to progress towards goals.     Rehab Potential  Good    PT Frequency  2x / week    PT Duration  12 weeks    PT Treatment/Interventions  ADLs/Self Care Home Management;Canalith Repostioning;DME Instruction;Gait training;Stair training;Functional mobility training;Therapeutic activities;Therapeutic exercise;Balance training;Neuromuscular re-education;Patient/family education;Prosthetic Training;Dry needling;Vestibular    PT Next Visit Plan  review prosthetic care, continue prosthetic gait including ramp, curb & stairs, gait with [facility],  dynamic balance.     Consulted and Agree with Plan of Care  Patient;Family member/caregiver    Family Member Consulted  wife       Patient will benefit from skilled therapeutic intervention in order to improve the following deficits and impairments:  Abnormal gait, Decreased activity tolerance, Decreased balance, Decreased endurance, Decreased knowledge of use of DME, Decreased mobility, Decreased range of motion, Decreased scar mobility, Decreased strength, Dizziness, Increased edema, Impaired flexibility, Postural dysfunction, Prosthetic Dependency  Visit Diagnosis: Other abnormalities of gait and mobility  Unsteadiness on feet  Abnormal posture  Muscle weakness (generalized)     Problem List Patient Active Problem List   Diagnosis Date Noted  . Non-healing wound of lower extremity 03/18/2018  . Nonhealing surgical wound  03/12/2018  . PAD (peripheral artery disease) ([place]) 03/03/2018  . PVD (peripheral vascular disease) ([place]) 03/03/2018  . Iron deficiency anemia   . Atrophic gastritis without hemorrhage   . OA (osteoarthritis) of hip 05/01/2017  . OA (osteoarthritis) of knee 12/13/2014  . Other pancytopenia ([place]) 02/04/2013  . Fever 02/02/2013  . Neutropenia ([place]) 02/02/2013  . Weakness 02/01/2013  . Rigors 02/01/2013  . Headache(784.0) 02/01/2013  . Lupus ([place]) 02/01/2013  . Thrombocytopenia ([place]) 02/01/2013  . DVT (deep venous thrombosis) ([place])   . HYPERTENSION, UNSPECIFIED 06/22/2009  . CHEST PAIN-UNSPECIFIED 06/22/2009    , PTA   A  09/05/2018, 2:20 PM  [facility] [address] [place], [place], [address] Phone: [phone]   Fax:  [phone]  Name: Jose Bush MRN: [id number] Date of Birth: Mar 22, 1941

## 2018-09-08 ENCOUNTER — Encounter: Payer: Self-pay | Admitting: Physical Therapy

## 2018-09-08 ENCOUNTER — Ambulatory Visit: Payer: Medicare HMO | Admitting: Physical Therapy

## 2018-09-08 DIAGNOSIS — R293 Abnormal posture: Secondary | ICD-10-CM

## 2018-09-08 DIAGNOSIS — S81809A Unspecified open wound, unspecified lower leg, initial encounter: Secondary | ICD-10-CM | POA: Diagnosis not present

## 2018-09-08 DIAGNOSIS — M25661 Stiffness of right knee, not elsewhere classified: Secondary | ICD-10-CM

## 2018-09-08 DIAGNOSIS — R2681 Unsteadiness on feet: Secondary | ICD-10-CM | POA: Diagnosis not present

## 2018-09-08 DIAGNOSIS — M6281 Muscle weakness (generalized): Secondary | ICD-10-CM | POA: Diagnosis not present

## 2018-09-08 DIAGNOSIS — R2689 Other abnormalities of gait and mobility: Secondary | ICD-10-CM

## 2018-09-09 NOTE — Therapy (Signed)
[facility] [address] [place], [place], [address] Phone: [phone]   Fax:  [phone]  Physical Therapy Treatment  Patient Details  Name: Jose Bush MRN: [id number] Date of Birth: 08-07-1941 Referring Provider (PT): [name], MD   Encounter Date: 09/08/2018  PT End of Session - 09/08/18 1800    Visit Number  5    Number of Visits  25    Date for PT Re-Evaluation  11/18/18    Authorization Type  [employer]    Authorization Time Period  $[id number] met, VL follow Medicare guidelines    PT Start Time  1400    PT Stop Time  1445    PT Time Calculation (min)  45 min    Equipment Utilized During Treatment  Gait belt    Activity Tolerance  Patient tolerated treatment well    Behavior During Therapy  [facility] for tasks assessed/performed       Past Medical History:  Diagnosis Date  . Arthritis   . Chest pain, unspecified   . DVT (deep venous thrombosis) ([place])   . GERD (gastroesophageal reflux disease)   . Heart murmur    hx of   . History of hiatal hernia   . HTN (hypertension)   . Lupus ([place])   . Lupus ([place])   . Peripheral vascular disease (HCC)    nonviable tissue   . Shingles    hx of   . Stroke [facility]) 2010   'MILD" per pt-  weakness left side  . Tinnitus   . Ulcer of abdomen wall ([place])   . Wears dentures   . Wears glasses     Past Surgical History:  Procedure Laterality Date  . ABDOMINAL AORTOGRAM W/LOWER EXTREMITY N/A 02/17/2018   Procedure: ABDOMINAL AORTOGRAM W/LOWER EXTREMITY;  Surgeon: [name], MD;  Location: [facility];  Service: Cardiovascular;  Laterality: N/A;  . AMPUTATION Right 03/03/2018   Procedure: AMPUTATION RIGHT SECOND TOE;  Surgeon: [name], MD;  Location: [facility];  Service: Vascular;  Laterality: Right;  . AMPUTATION Right 03/18/2018   Procedure: AMPUTATION BELOW KNEE;  Surgeon: [name], MD;  Location: [facility];  Service: Vascular;   Laterality: Right;  . BELOW KNEE LEG AMPUTATION Right 03/18/2018  . bilateral cataract surgery     . BIOPSY N/A 10/22/2017   Procedure: BIOPSY;  Surgeon: [name], MD;  [facility];  Service: Gastroenterology;  Laterality: N/A;  . COLONOSCOPY N/A 09/18/2016   Dr. [name]: normal  . ESOPHAGOGASTRODUODENOSCOPY N/A 10/22/2017   Dr. [name]: normal duodenum, gastric mucosal atrophy but negative H.pylori (Clotest negative), normal GE junction  . GIVENS CAPSULE STUDY N/A 01/27/2018   Procedure: GIVENS CAPSULE STUDY;  Surgeon: [name], MD;  [facility];  Service: Endoscopy;  Laterality: N/A;  7:00am  . HERNIA REPAIR     2010/laparoscopic/right  . ivc filter     01/2013   . KNEE ARTHROSCOPY Right    2007  . MULTIPLE TOOTH EXTRACTIONS    . PERIPHERAL VASCULAR BALLOON ANGIOPLASTY  02/17/2018   Procedure: PERIPHERAL VASCULAR BALLOON ANGIOPLASTY;  Surgeon: [name], MD;  Location: [facility];  Service: Cardiovascular;;  . TOTAL HIP ARTHROPLASTY Right 05/01/2017   Procedure: RIGHT TOTAL HIP ARTHROPLASTY ANTERIOR APPROACH;  Surgeon: [name], MD;  [facility];  Service: Orthopedics;  Laterality: Right;  . TOTAL KNEE ARTHROPLASTY Left 12/13/2014   Procedure: LEFT TOTAL KNEE ARTHROPLASTY;  Surgeon: [name], MD;  [facility];  Service: Orthopedics;  Laterality: Left;  . TOTAL KNEE ARTHROPLASTY Right 03/21/2015   Procedure: RIGHT TOTAL KNEE ARTHROPLASTY;  Surgeon: [name], MD;  [facility];  Service: Orthopedics;  Laterality: Right;    There were no vitals filed for this visit.  Subjective Assessment - 09/08/18 1402    Subjective  He has been wearing prosthesis 5 hrs 2x/day for last week with no issues.     Patient is accompained by:  Family member   Wife   Pertinent History  R TTA, R THA 05/01/17, R TKA 03/21/15, LTKA 12/13/14, PAD/PVD, OA, lupus, HTN, heart murmur, shingles, mild CVA,     Limitations   Lifting;Standing;Walking;House hold activities    Patient Stated Goals  To use prosthesis to return to yard work & being active in community.    Currently in Pain?  No/denies                       [facility] Adult PT Treatment/Exercise - 09/08/18 1400      Ambulation/Gait   Ambulation/Gait  Yes    Ambulation/Gait Assistance  4: Min guard;4: Min assist    Ambulation/Gait Assistance Details  demo, tactile & verbal cues on step width (not abducting) & step length with step thru pattern.     Ambulation Distance (Feet)  130 Feet   130' X 2 with cane, arrived & exited with rollator   Assistive device  Prosthesis;Straight cane   quad tip   Gait Pattern  --    Ambulation Surface  Indoor;Level      Self-Care   Self-Care  Lifting    Lifting  PT demo, instructed in technique with TTA prosthesis. Pt return demo with LUE support on table. PT instructed for safety to have chair behind him & table or locked rollator in front. pt & wife verbalized understanding.       Exercises   Exercises  --      Knee/Hip Exercises: Aerobic   Nustep  --      Prosthetics   Prosthetic Care Comments   signs of sweating & drying limb/liner, use wet clothe or dry clothe to rub limb after doffing to avoid scratching with itch feeling;  increase wear to 6hr 2x/day drying half way;  reviewed proper donning; use of cut-off sock under proximal liner as sweat barrier;  use of antiperspirant on limb.     Current prosthetic wear tolerance (days/week)   daily    Current prosthetic wear tolerance (#hours/day)   5 hrs 2x/day    Residual limb condition   proximal scratch superficial on thigh area under liner,  posterior thigh at top of liner small circular blister probably from sweat.     Education Provided  Correct ply sock adjustment;Skin check;Residual limb care;Prosthetic cleaning;Proper Donning;Proper wear schedule/adjustment;Other (comment)   see prosthetic care comments   Person(s) Educated  Patient;Spouse     Education Method  Explanation;Demonstration;Tactile cues;Verbal cues    Education Method  Verbalized understanding;Returned demonstration;Tactile cues required;Verbal cues required;Needs further instruction               PT Short Term Goals - 09/08/18 1800      PT SHORT TERM GOAL #1   Title  Patient donnes prosthesis correctly & verbalizes proper cleaning. (All STGs Target Dates: 09/19/2018)    Time  4    Period  Weeks    Status  On-going    Target Date  09/19/18      PT SHORT TERM GOAL #2   Title  Patient tolerates prosthesis wear >8hrs total per day without skin issues.     Time  4    Period  Weeks    Status  On-going    Target Date  09/19/18      PT SHORT TERM GOAL #3   Title  Patient standing balance without UE support static 2 minutes and reaches 5" without balance loss.     Time  4    Period  Weeks    Status  On-going    Target Date  09/19/18      PT SHORT TERM GOAL #4   Title  Patient ambulates 300' with rolling walker & prosthesis safely no loss of balance with cues only.     Time  4    Period  Weeks    Status  On-going    Target Date  09/19/18      PT SHORT TERM GOAL #5   Title  Patient negotiates ramps & curbs with walker and stairs 1 rail/cane with supervision.     Time  4    Period  Weeks    Status  On-going    Target Date  09/19/18      PT SHORT TERM GOAL #6   Title  Patient ambulates 100' with cane & prosthesis with minA.     Time  4    Period  Weeks    Status  On-going    Target Date  09/19/18        PT Long Term Goals - 09/08/18 1800      PT LONG TERM GOAL #1   Title  Patient demonstrates & verbalizes understanding of prosthetic care to enable safe use. (All LTGs Target Date: 11/14/2018)    Time  12    Period  Weeks    Status  On-going    Target Date  11/14/18      PT LONG TERM GOAL #2   Title  Patient tolerates prosthesis wear >90% of awake hours without skin or limb pain issues to enable function throughout his day.     Time  12     Period  Weeks    Status  On-going    Target Date  11/14/18      PT LONG TERM GOAL #3   Title  Berg Balance >36/56 to indicate lower fall risk.     Time  12    Period  Weeks    Status  On-going    Target Date  11/14/18      PT LONG TERM GOAL #4   Title  Patient ambulates 300' outdoors including grass with cane or less and prosthesis modified independent for community mobility.     Time  12    Period  Weeks    Status  On-going    Target Date  11/14/18      PT LONG TERM GOAL #5   Title  Patient negotiates ramps, curbs & stairs single rail with cane or less & prosthesis modified independent for community access.     Time  12    Period  Weeks    Status  On-going    Target Date  11/14/18      PT LONG TERM GOAL #6   Title  Patient ambulates with prosthesis only around furniture carrying household items modified independent for household mobility.     Time  12    Period  Weeks    Status  On-going    Target Date  11/14/18            Plan - 09/08/18 1800    Clinical Impression Statement  patient's residual limb has small changes typically from sweat inside liner and seems to understand skilled instructions given today to address issues. He improved prosthetic gait with instruction in proper step width & step length.     Rehab Potential  Good    PT Frequency  2x / week    PT Duration  12 weeks    PT Treatment/Interventions  ADLs/Self Care Home Management;Canalith Repostioning;DME Instruction;Gait training;Stair training;Functional mobility training;Therapeutic activities;Therapeutic exercise;Balance training;Neuromuscular re-education;Patient/family education;Prosthetic Training;Dry needling;Vestibular    PT Next Visit Plan  review prosthetic care, continue prosthetic gait including ramp, curb & stairs, gait with cane quad tip, dynamic balance.     Consulted and Agree with Plan of Care  Patient;Family member/caregiver    Family Member Consulted  wife       Patient will  benefit from skilled therapeutic intervention in order to improve the following deficits and impairments:  Abnormal gait, Decreased activity tolerance, Decreased balance, Decreased endurance, Decreased knowledge of use of DME, Decreased mobility, Decreased range of motion, Decreased scar mobility, Decreased strength, Dizziness, Increased edema, Impaired flexibility, Postural dysfunction, Prosthetic Dependency  Visit Diagnosis: Other abnormalities of gait and mobility  Unsteadiness on feet  Abnormal posture  Muscle weakness (generalized)  Stiffness of right knee, not elsewhere classified     Problem List Patient Active Problem List   Diagnosis Date Noted  . Non-healing wound of lower extremity 03/18/2018  . Nonhealing surgical wound 03/12/2018  . PAD (peripheral artery disease) ([place]) 03/03/2018  . PVD (peripheral vascular disease) ([place]) 03/03/2018  . Iron deficiency anemia   . Atrophic gastritis without hemorrhage   . OA (osteoarthritis) of hip 05/01/2017  . OA (osteoarthritis) of knee 12/13/2014  . Other pancytopenia ([place]) 02/04/2013  . Fever 02/02/2013  . Neutropenia ([place]) 02/02/2013  . Weakness 02/01/2013  . Rigors 02/01/2013  . Headache(784.0) 02/01/2013  . Lupus ([place]) 02/01/2013  . Thrombocytopenia ([place]) 02/01/2013  . DVT (deep venous thrombosis) ([place])   . HYPERTENSION, UNSPECIFIED 06/22/2009  . CHEST PAIN-UNSPECIFIED 06/22/2009    Jamey Reas PT, DPT 09/09/2018, 10:59 AM  [facility] [address] [place], [place], [address] Phone: [phone]   Fax:  [phone]  Name: Jose Bush MRN: [id number] Date of Birth: December 26, 1940

## 2018-09-10 ENCOUNTER — Ambulatory Visit: Payer: Medicare HMO | Admitting: Physical Therapy

## 2018-09-10 ENCOUNTER — Encounter: Payer: Self-pay | Admitting: Physical Therapy

## 2018-09-10 DIAGNOSIS — M25661 Stiffness of right knee, not elsewhere classified: Secondary | ICD-10-CM | POA: Diagnosis not present

## 2018-09-10 DIAGNOSIS — R293 Abnormal posture: Secondary | ICD-10-CM

## 2018-09-10 DIAGNOSIS — M6281 Muscle weakness (generalized): Secondary | ICD-10-CM

## 2018-09-10 DIAGNOSIS — R2681 Unsteadiness on feet: Secondary | ICD-10-CM | POA: Diagnosis not present

## 2018-09-10 DIAGNOSIS — R2689 Other abnormalities of gait and mobility: Secondary | ICD-10-CM

## 2018-09-11 NOTE — Therapy (Signed)
[facility] [address] [place], [place], [address] Phone: [phone]   Fax:  [phone]  Physical Therapy Treatment  Patient Details  Name: Jose Bush MRN: [id number] Date of Birth: September 18, 1941 Referring Provider (PT): [name], MD   Encounter Date: 09/10/2018  PT End of Session - 09/10/18 1838    Visit Number  6    Number of Visits  25    Date for PT Re-Evaluation  11/18/18    Authorization Type  [employer]    Authorization Time Period  $[id number] met, VL follow Medicare guidelines    PT Start Time  1230    PT Stop Time  1315    PT Time Calculation (min)  45 min    Equipment Utilized During Treatment  Gait belt    Activity Tolerance  Patient tolerated treatment well    Behavior During Therapy  [facility] for tasks assessed/performed       Past Medical History:  Diagnosis Date  . Arthritis   . Chest pain, unspecified   . DVT (deep venous thrombosis) ([place])   . GERD (gastroesophageal reflux disease)   . Heart murmur    hx of   . History of hiatal hernia   . HTN (hypertension)   . Lupus ([place])   . Lupus ([place])   . Peripheral vascular disease ([facility])    nonviable tissue   . Shingles    hx of   . Stroke [facility]) 2010   'MILD" per pt-  weakness left side  . Tinnitus   . Ulcer of abdomen wall ([place])   . Wears dentures   . Wears glasses     Past Surgical History:  Procedure Laterality Date  . ABDOMINAL AORTOGRAM W/LOWER EXTREMITY N/A 02/17/2018   Procedure: ABDOMINAL AORTOGRAM W/LOWER EXTREMITY;  Surgeon: [name], MD;  Location: [facility];  Service: Cardiovascular;  Laterality: N/A;  . AMPUTATION Right 03/03/2018   Procedure: AMPUTATION RIGHT SECOND TOE;  Surgeon: [name], MD;  Location: [facility];  Service: Vascular;  Laterality: Right;  . AMPUTATION Right 03/18/2018   Procedure: AMPUTATION BELOW KNEE;  Surgeon: [name], MD;  Location: [facility];  Service: Vascular;   Laterality: Right;  . BELOW KNEE LEG AMPUTATION Right 03/18/2018  . bilateral cataract surgery     . BIOPSY N/A 10/22/2017   Procedure: BIOPSY;  Surgeon: [name], MD;  [facility];  Service: Gastroenterology;  Laterality: N/A;  . COLONOSCOPY N/A 09/18/2016   Dr. [name]: normal  . ESOPHAGOGASTRODUODENOSCOPY N/A 10/22/2017   Dr. [name]: normal duodenum, gastric mucosal atrophy but negative H.pylori (Clotest negative), normal GE junction  . GIVENS CAPSULE STUDY N/A 01/27/2018   Procedure: GIVENS CAPSULE STUDY;  Surgeon: [name], MD;  [facility];  Service: Endoscopy;  Laterality: N/A;  7:00am  . HERNIA REPAIR     2010/laparoscopic/right  . ivc filter     01/2013   . KNEE ARTHROSCOPY Right    2007  . MULTIPLE TOOTH EXTRACTIONS    . PERIPHERAL VASCULAR BALLOON ANGIOPLASTY  02/17/2018   Procedure: PERIPHERAL VASCULAR BALLOON ANGIOPLASTY;  Surgeon: [name], MD;  Location: [facility];  Service: Cardiovascular;;  . TOTAL HIP ARTHROPLASTY Right 05/01/2017   Procedure: RIGHT TOTAL HIP ARTHROPLASTY ANTERIOR APPROACH;  Surgeon: [name], MD;  Location: [facility];  Service: Orthopedics;  Laterality: Right;  . TOTAL KNEE ARTHROPLASTY Left 12/13/2014   Procedure: LEFT TOTAL KNEE ARTHROPLASTY;  Surgeon: [name], MD;  Location: [facility];  Service: Orthopedics;  Laterality: Left;  . TOTAL KNEE ARTHROPLASTY Right 03/21/2015   Procedure: RIGHT TOTAL KNEE ARTHROPLASTY;  Surgeon: [name], MD;  Location: [facility];  Service: Orthopedics;  Laterality: Right;    There were no vitals filed for this visit.  Subjective Assessment - 09/10/18 1228    Subjective  Patient has been wearing 6 hrs 2x/day yesterday as PT recommended. No issues.     Patient is accompained by:  Family member   Wife   Pertinent History  R TTA, R THA 05/01/17, R TKA 03/21/15, LTKA 12/13/14, PAD/PVD, OA, lupus, HTN, heart murmur, shingles, mild CVA,     Limitations   Lifting;Standing;Walking;House hold activities    Patient Stated Goals  To use prosthesis to return to yard work & being active in community.    Currently in Pain?  No/denies                       [facility] Adult PT Treatment/Exercise - 09/10/18 1230      Transfers   Transfers  Sit to Stand;Stand to Sit    Sit to Stand  3: Mod assist;5: Supervision;With upper extremity assist;With armrests;From chair/3-in-1;Other (comment)   chairs without armrests using UEs modA   Sit to Stand Details  Visual cues/gestures for sequencing;Verbal cues for technique;Verbal cues for sequencing;Other (comment)   cues for chairs without armrests using UEs   Sit to Stand Details (indicate cue type and reason)  worked on standing from 24" stool without UE assist with RW close as needed to stabilize 7 reps before fatigue    Stand to Sit  5: Supervision;With upper extremity assist;With armrests;To chair/3-in-1   to chairs without armrests   Stand to Sit Details (indicate cue type and reason)  Visual cues for safe use of DME/AE;Visual cues/gestures for sequencing;Verbal cues for technique;Verbal cues for safe use of DME/AE;Other (comment)   cues for chairs without armrests using UEs   Stand to Sit Details  7 reps to 24" stool without UE assist      Ambulation/Gait   Ambulation/Gait  Yes    Ambulation/Gait Assistance  4: Min guard;4: Min assist    Ambulation/Gait Assistance Details  verbal cues on upright posture & step length    Ambulation Distance (Feet)  130 Feet   130' X 2 with cane, arrived & exited with rollator   Assistive device  Prosthesis;Straight cane   quad tip   Ambulation Surface  Indoor;Level      Self-Care   Self-Care  Lifting    Lifting  PT demo, instructed in technique with TTA prosthesis. Pt return demo with LUE support on table. PT instructed for safety to have chair behind him & table or locked rollator in front. pt & wife verbalized understanding.       Neuro Re-ed    Neuro  Re-ed Details   sitting on 24" stool without UE support: PT recommended working on this activity at home near table or counter support if needed to increase muscle endurance / balance waist up without UE support. Pt & wife verbalized understanding.       Prosthetics   Prosthetic Care Comments   --    Current prosthetic wear tolerance (days/week)   daily    Current prosthetic wear tolerance (#hours/day)   6 hrs 2x/day    Residual limb condition   --    Education Provided  Correct ply sock adjustment;Skin check;Residual  limb care;Prosthetic cleaning;Proper Donning;Proper wear schedule/adjustment;Other (comment)   see prosthetic care comments              PT Short Term Goals - 09/08/18 1800      PT SHORT TERM GOAL #1   Title  Patient donnes prosthesis correctly & verbalizes proper cleaning. (All STGs Target Dates: 09/19/2018)    Time  4    Period  Weeks    Status  On-going    Target Date  09/19/18      PT SHORT TERM GOAL #2   Title  Patient tolerates prosthesis wear >8hrs total per day without skin issues.     Time  4    Period  Weeks    Status  On-going    Target Date  09/19/18      PT SHORT TERM GOAL #3   Title  Patient standing balance without UE support static 2 minutes and reaches 5" without balance loss.     Time  4    Period  Weeks    Status  On-going    Target Date  09/19/18      PT SHORT TERM GOAL #4   Title  Patient ambulates 300' with rolling walker & prosthesis safely no loss of balance with cues only.     Time  4    Period  Weeks    Status  On-going    Target Date  09/19/18      PT SHORT TERM GOAL #5   Title  Patient negotiates ramps & curbs with walker and stairs 1 rail/cane with supervision.     Time  4    Period  Weeks    Status  On-going    Target Date  09/19/18      PT SHORT TERM GOAL #6   Title  Patient ambulates 100' with cane & prosthesis with minA.     Time  4    Period  Weeks    Status  On-going    Target Date  09/19/18        PT  Long Term Goals - 09/08/18 1800      PT LONG TERM GOAL #1   Title  Patient demonstrates & verbalizes understanding of prosthetic care to enable safe use. (All LTGs Target Date: 11/14/2018)    Time  12    Period  Weeks    Status  On-going    Target Date  11/14/18      PT LONG TERM GOAL #2   Title  Patient tolerates prosthesis wear >90% of awake hours without skin or limb pain issues to enable function throughout his day.     Time  12    Period  Weeks    Status  On-going    Target Date  11/14/18      PT LONG TERM GOAL #3   Title  Berg Balance >36/56 to indicate lower fall risk.     Time  12    Period  Weeks    Status  On-going    Target Date  11/14/18      PT LONG TERM GOAL #4   Title  Patient ambulates 300' outdoors including grass with cane or less and prosthesis modified independent for community mobility.     Time  12    Period  Weeks    Status  On-going    Target Date  11/14/18      PT LONG TERM GOAL #5   Title  Patient  negotiates ramps, curbs & stairs single rail with cane or less & prosthesis modified independent for community access.     Time  12    Period  Weeks    Status  On-going    Target Date  11/14/18      PT LONG TERM GOAL #6   Title  Patient ambulates with prosthesis only around furniture carrying household items modified independent for household mobility.     Time  12    Period  Weeks    Status  On-going    Target Date  11/14/18            Plan - 09/10/18 1800    Clinical Impression Statement  Patient needs assist to stand from chairs without armrests using UEs. He was able to achieve from 24" stool & will work on this for exercise at home. Patient is progressing towards STGs and should achieve next week on target.     Rehab Potential  Good    PT Frequency  2x / week    PT Duration  12 weeks    PT Treatment/Interventions  ADLs/Self Care Home Management;Canalith Repostioning;DME Instruction;Gait training;Stair training;Functional mobility  training;Therapeutic activities;Therapeutic exercise;Balance training;Neuromuscular re-education;Patient/family education;Prosthetic Training;Dry needling;Vestibular    PT Next Visit Plan  check STGs, review prosthetic care, continue prosthetic gait including ramp, curb & stairs, gait with cane quad tip, dynamic balance.     Consulted and Agree with Plan of Care  Patient;Family member/caregiver    Family Member Consulted  wife       Patient will benefit from skilled therapeutic intervention in order to improve the following deficits and impairments:  Abnormal gait, Decreased activity tolerance, Decreased balance, Decreased endurance, Decreased knowledge of use of DME, Decreased mobility, Decreased range of motion, Decreased scar mobility, Decreased strength, Dizziness, Increased edema, Impaired flexibility, Postural dysfunction, Prosthetic Dependency  Visit Diagnosis: Other abnormalities of gait and mobility  Unsteadiness on feet  Abnormal posture  Muscle weakness (generalized)  Stiffness of right knee, not elsewhere classified     Problem List Patient Active Problem List   Diagnosis Date Noted  . Non-healing wound of lower extremity 03/18/2018  . Nonhealing surgical wound 03/12/2018  . PAD (peripheral artery disease) ([place]) 03/03/2018  . PVD (peripheral vascular disease) ([place]) 03/03/2018  . Iron deficiency anemia   . Atrophic gastritis without hemorrhage   . OA (osteoarthritis) of hip 05/01/2017  . OA (osteoarthritis) of knee 12/13/2014  . Other pancytopenia ([place]) 02/04/2013  . Fever 02/02/2013  . Neutropenia ([place]) 02/02/2013  . Weakness 02/01/2013  . Rigors 02/01/2013  . Headache(784.0) 02/01/2013  . Lupus ([place]) 02/01/2013  . Thrombocytopenia ([place]) 02/01/2013  . DVT (deep venous thrombosis) ([place])   . HYPERTENSION, UNSPECIFIED 06/22/2009  . CHEST PAIN-UNSPECIFIED 06/22/2009    Jamey Reas PT, DPT 09/11/2018, 8:49 AM  [facility] [address] [place], [place], [address] Phone: [phone]   Fax:  [phone]  Name: Jose Bush MRN: [id number] Date of Birth: [birth year]

## 2018-09-15 ENCOUNTER — Ambulatory Visit: Payer: Medicare HMO | Admitting: Physical Therapy

## 2018-09-15 ENCOUNTER — Encounter: Payer: Self-pay | Admitting: Physical Therapy

## 2018-09-15 DIAGNOSIS — M25661 Stiffness of right knee, not elsewhere classified: Secondary | ICD-10-CM | POA: Diagnosis not present

## 2018-09-15 DIAGNOSIS — R293 Abnormal posture: Secondary | ICD-10-CM | POA: Diagnosis not present

## 2018-09-15 DIAGNOSIS — M6281 Muscle weakness (generalized): Secondary | ICD-10-CM

## 2018-09-15 DIAGNOSIS — R2689 Other abnormalities of gait and mobility: Secondary | ICD-10-CM | POA: Diagnosis not present

## 2018-09-15 DIAGNOSIS — R2681 Unsteadiness on feet: Secondary | ICD-10-CM | POA: Diagnosis not present

## 2018-09-15 DIAGNOSIS — M150 Primary generalized (osteo)arthritis: Secondary | ICD-10-CM | POA: Diagnosis not present

## 2018-09-15 DIAGNOSIS — Z6831 Body mass index (BMI) 31.0-31.9, adult: Secondary | ICD-10-CM | POA: Diagnosis not present

## 2018-09-15 DIAGNOSIS — M329 Systemic lupus erythematosus, unspecified: Secondary | ICD-10-CM | POA: Diagnosis not present

## 2018-09-15 DIAGNOSIS — M2550 Pain in unspecified joint: Secondary | ICD-10-CM | POA: Diagnosis not present

## 2018-09-15 DIAGNOSIS — D6861 Antiphospholipid syndrome: Secondary | ICD-10-CM | POA: Diagnosis not present

## 2018-09-15 DIAGNOSIS — E669 Obesity, unspecified: Secondary | ICD-10-CM | POA: Diagnosis not present

## 2018-09-15 DIAGNOSIS — R5382 Chronic fatigue, unspecified: Secondary | ICD-10-CM | POA: Diagnosis not present

## 2018-09-17 NOTE — Therapy (Signed)
[facility] [address] [place], [place], [address] Phone: [phone]   Fax:  [phone]  Physical Therapy Treatment  Patient Details  Name: Jose Bush MRN: [id number] Date of Birth: 02/27/1941 Referring Provider (PT): [name], MD   Encounter Date: 09/15/2018     09/15/18 1800  PT Visits / Re-Eval  Visit Number 7  Number of Visits 25  Date for PT Re-Evaluation 11/18/18  Authorization  Authorization Type [employer]  Authorization Time Period [id number] met, VL follow Medicare guidelines  PT Time Calculation  PT Start Time 1400  PT Stop Time 1445  PT Time Calculation (min) 45 min  PT - End of Session  Equipment Utilized During Treatment Gait belt  Activity Tolerance Patient tolerated treatment well  Behavior During Therapy [facility] for tasks assessed/performed    Past Medical History:  Diagnosis Date  . Arthritis   . Chest pain, unspecified   . DVT (deep venous thrombosis) ([place])   . GERD (gastroesophageal reflux disease)   . Heart murmur    hx of   . History of hiatal hernia   . HTN (hypertension)   . Lupus ([place])   . Lupus ([place])   . Peripheral vascular disease ([facility])    nonviable tissue   . Shingles    hx of   . Stroke [facility]) 2010   'MILD" per pt-  weakness left side  . Tinnitus   . Ulcer of abdomen wall ([place])   . Wears dentures   . Wears glasses     Past Surgical History:  Procedure Laterality Date  . ABDOMINAL AORTOGRAM W/LOWER EXTREMITY N/A 02/17/2018   Procedure: ABDOMINAL AORTOGRAM W/LOWER EXTREMITY;  Surgeon: [name], MD;  Location: [facility];  Service: Cardiovascular;  Laterality: N/A;  . AMPUTATION Right 03/03/2018   Procedure: AMPUTATION RIGHT SECOND TOE;  Surgeon: [name], MD;  Location: [facility];  Service: Vascular;  Laterality: Right;  . AMPUTATION Right 03/18/2018   Procedure: AMPUTATION BELOW KNEE;  Surgeon: [name], MD;   Location: [facility];  Service: Vascular;  Laterality: Right;  . BELOW KNEE LEG AMPUTATION Right 03/18/2018  . bilateral cataract surgery     . BIOPSY N/A 10/22/2017   Procedure: BIOPSY;  Surgeon: [name], MD;  [facility];  Service: Gastroenterology;  Laterality: N/A;  . COLONOSCOPY N/A 09/18/2016   Dr. [name]: normal  . ESOPHAGOGASTRODUODENOSCOPY N/A 10/22/2017   Dr. [name]: normal duodenum, gastric mucosal atrophy but negative H.pylori (Clotest negative), normal GE junction  . GIVENS CAPSULE STUDY N/A 01/27/2018   Procedure: GIVENS CAPSULE STUDY;  Surgeon: [name], MD;  [facility];  Service: Endoscopy;  Laterality: N/A;  7:00am  . HERNIA REPAIR     2010/laparoscopic/right  . ivc filter     01/2013   . KNEE ARTHROSCOPY Right    2007  . MULTIPLE TOOTH EXTRACTIONS    . PERIPHERAL VASCULAR BALLOON ANGIOPLASTY  02/17/2018   Procedure: PERIPHERAL VASCULAR BALLOON ANGIOPLASTY;  Surgeon: [name], MD;  Location: [facility];  Service: Cardiovascular;;  . TOTAL HIP ARTHROPLASTY Right 05/01/2017   Procedure: RIGHT TOTAL HIP ARTHROPLASTY ANTERIOR APPROACH;  Surgeon: [name], MD;  Location: [facility];  Service: Orthopedics;  Laterality: Right;  . TOTAL KNEE ARTHROPLASTY Left 12/13/2014   Procedure: LEFT TOTAL KNEE ARTHROPLASTY;  Surgeon: [name], MD;  Location: [facility];  Service: Orthopedics;  Laterality: Left;  . TOTAL KNEE ARTHROPLASTY Right  03/21/2015   Procedure: RIGHT TOTAL KNEE ARTHROPLASTY;  Surgeon: [name], MD;  [facility];  Service: Orthopedics;  Laterality: Right;    There were no vitals filed for this visit.     09/15/18 1400  Symptoms/Limitations  Subjective He has been wearing prosthesis 6hrs 2x/day drying half way no issues. He saw rheumatologist today who put him on predisone low dose for 6 months for lupus & RA.   Patient is accompained by: Family member (Wife)  Pertinent History R TTA, R THA 05/01/17, R  TKA 03/21/15, LTKA 12/13/14, PAD/PVD, OA, lupus, HTN, heart murmur, shingles, mild CVA,   Limitations Lifting;Standing;Walking;House hold activities  Patient Stated Goals To use prosthesis to return to yard work & being active in community.  Pain Assessment  Currently in Pain? No/denies        09/15/18 1400  Transfers  Transfers Sit to Stand;Stand to Sit  Sit to Stand 5: Supervision;With upper extremity assist;With armrests;From chair/3-in-1;Other (comment)  Sit to Stand Details Visual cues/gestures for sequencing;Verbal cues for technique;Verbal cues for sequencing;Other (comment) (cues for chairs without armrests using UEs)  Sit to Stand Details (indicate cue type and reason) stands without UE assist from 24" stool  Stand to Sit 5: Supervision;With upper extremity assist;With armrests;To chair/3-in-1 (to chairs without armrests)  Stand to Sit Details (indicate cue type and reason) Visual cues for safe use of DME/AE;Visual cues/gestures for sequencing;Verbal cues for technique;Verbal cues for safe use of DME/AE;Other (comment) (cues for chairs without armrests using UEs)  Ambulation/Gait  Ambulation/Gait Yes  Ambulation/Gait Assistance 4: Min guard;4: Min assist;5: Supervision (supervision with RW & MinA/guard cane)  Ambulation/Gait Assistance Details Tactile & verbal cues on step length, upright posture, step width and wt shift over prosthesis in stance.   Ambulation Distance (Feet) 300 Feet (>300' with RW, 150' X 2 cane)  Assistive device Prosthesis;Straight cane (quad tip)  Ambulation Surface Indoor;Level  Stairs Yes  Stairs Assistance 5: Supervision  Stairs Assistance Details (indicate cue type and reason) demo & cues on technique with cane/single rail step-to & alternating with 2 rails.   Stair Management Technique One rail Left;With cane;Step to pattern;Two rails;Alternating pattern;Forwards  Number of Stairs 4 (2 reps cane/single rail step-to, 3 reps 2 rails alt. )  Ramp 4:  Min assist;6: Modified independent (Device) (MinA cane & prosthesis, Mod Ind. RW)  Ramp Details (indicate cue type and reason) demo, verbal & tactile cues on technique with cane & prosthesis  Curb 4: Min assist;6: Modified independent (Device/increase time) (MinA cane & Mod Ind RW)  Curb Details (indicate cue type and reason) demo, verbal & tactile cues on technique with cane & prosthesis  Balance  Balance Assessed Yes  Static Standing Balance  Static Standing - Balance Support No upper extremity supported  Static Standing - Level of Assistance 5: Stand by assistance  Static Standing - Comment/# of Minutes 2 minutes  Dynamic Standing Balance  Dynamic Standing - Balance Support No upper extremity supported  Dynamic Standing - Level of Assistance 5: Stand by assistance  Reaching for objects comments: 5"  Self-Care  Self-Care Lifting  Lifting PT demo, instructed in technique with TTA prosthesis. Pt return demo with LUE support on table. PT instructed for safety to have chair behind him & table or locked rollator in front. pt & wife verbalized understanding.   Prosthetics  Prosthetic Care Comments  Increase wear to all awake hours drying q4-5 hrs or as needed with signs of sweating.   Current prosthetic wear tolerance (  days/week)  daily  Current prosthetic wear tolerance (#hours/day)  6 hrs 2x/day  Residual limb condition  no open areas, improved moisture,   Education Provided Residual limb care;Correct ply sock adjustment;Other (comment);Proper wear schedule/adjustment (see prosthetic care comments)  Person(s) Educated Patient;Spouse  Education Method Explanation;Demonstration;Tactile cues;Verbal cues  Education Method Verbalized understanding;Returned demonstration;Tactile cues required;Verbal cues required;Needs further instruction                        09/15/18 1800  Plan  Clinical Impression Statement Patient met all STGs today. Today's skilled session focused on  prosthetic gait with cane including negotiating obstacles and ramps, curbs & stairs.   Pt will benefit from skilled therapeutic intervention in order to improve on the following deficits Abnormal gait;Decreased activity tolerance;Decreased balance;Decreased endurance;Decreased knowledge of use of DME;Decreased mobility;Decreased range of motion;Decreased scar mobility;Decreased strength;Dizziness;Increased edema;Impaired flexibility;Postural dysfunction;Prosthetic Dependency  Rehab Potential Good  PT Frequency 2x / week  PT Duration 12 weeks  PT Treatment/Interventions ADLs/Self Care Home Management;Canalith Repostioning;DME Instruction;Gait training;Stair training;Functional mobility training;Therapeutic activities;Therapeutic exercise;Balance training;Neuromuscular re-education;Patient/family education;Prosthetic Training;Dry needling;Vestibular  PT Next Visit Plan work towards updated STGs, balance activities, continue prosthetic gait including ramp, curb & stairs, gait with cane quad tip, LE/core functional strengthening  Consulted and Agree with Plan of Care Patient;Family member/caregiver  Family Member Consulted wife     PT Short Term Goals - 09/15/18 1800      PT SHORT TERM GOAL #1   Title  Patient donnes prosthesis correctly & verbalizes proper cleaning. (All STGs Target Dates: 09/19/2018)    Baseline  MET 09/15/2018    Time  4    Period  Weeks    Status  Achieved      PT SHORT TERM GOAL #2   Title  Patient tolerates prosthesis wear >8hrs total per day without skin issues.     Baseline  MET 09/15/2018    Time  4    Period  Weeks    Status  Achieved      PT SHORT TERM GOAL #3   Title  Patient standing balance without UE support static 2 minutes and reaches 5" without balance loss.     Baseline  MET 09/15/2018    Time  4    Period  Weeks    Status  Achieved      PT SHORT TERM GOAL #4   Title  Patient ambulates 300' with rolling walker & prosthesis safely no loss of balance  with cues only.     Baseline  MET 09/15/2018    Time  4    Period  Weeks    Status  Achieved      PT SHORT TERM GOAL #5   Title  Patient negotiates ramps & curbs with walker and stairs 1 rail/cane with supervision.     Baseline  MET 09/15/2018    Time  4    Period  Weeks    Status  Achieved      PT SHORT TERM GOAL #6   Title  Patient ambulates 100' with cane & prosthesis with minA.     Baseline  MET 09/15/2018    Time  4    Period  Weeks    Status  Achieved        PT Long Term Goals - 09/08/18 1800      PT LONG TERM GOAL #1   Title  Patient demonstrates & verbalizes understanding of prosthetic care to enable safe use. (  All LTGs Target Date: 11/14/2018)    Time  12    Period  Weeks    Status  On-going    Target Date  11/14/18      PT LONG TERM GOAL #2   Title  Patient tolerates prosthesis wear >90% of awake hours without skin or limb pain issues to enable function throughout his day.     Time  12    Period  Weeks    Status  On-going    Target Date  11/14/18      PT LONG TERM GOAL #3   Title  Berg Balance >36/56 to indicate lower fall risk.     Time  12    Period  Weeks    Status  On-going    Target Date  11/14/18      PT LONG TERM GOAL #4   Title  Patient ambulates 300' outdoors including grass with cane or less and prosthesis modified independent for community mobility.     Time  12    Period  Weeks    Status  On-going    Target Date  11/14/18      PT LONG TERM GOAL #5   Title  Patient negotiates ramps, curbs & stairs single rail with cane or less & prosthesis modified independent for community access.     Time  12    Period  Weeks    Status  On-going    Target Date  11/14/18      PT LONG TERM GOAL #6   Title  Patient ambulates with prosthesis only around furniture carrying household items modified independent for household mobility.     Time  12    Period  Weeks    Status  On-going    Target Date  11/14/18              Patient will benefit  from skilled therapeutic intervention in order to improve the following deficits and impairments:     Visit Diagnosis: Other abnormalities of gait and mobility  Unsteadiness on feet  Abnormal posture  Muscle weakness (generalized)  Stiffness of right knee, not elsewhere classified     Problem List Patient Active Problem List   Diagnosis Date Noted  . Non-healing wound of lower extremity 03/18/2018  . Nonhealing surgical wound 03/12/2018  . PAD (peripheral artery disease) ([place]) 03/03/2018  . PVD (peripheral vascular disease) ([place]) 03/03/2018  . Iron deficiency anemia   . Atrophic gastritis without hemorrhage   . OA (osteoarthritis) of hip 05/01/2017  . OA (osteoarthritis) of knee 12/13/2014  . Other pancytopenia ([place]) 02/04/2013  . Fever 02/02/2013  . Neutropenia ([place]) 02/02/2013  . Weakness 02/01/2013  . Rigors 02/01/2013  . Headache(784.0) 02/01/2013  . Lupus ([place]) 02/01/2013  . Thrombocytopenia ([place]) 02/01/2013  . DVT (deep venous thrombosis) ([place])   . HYPERTENSION, UNSPECIFIED 06/22/2009  . CHEST PAIN-UNSPECIFIED 06/22/2009    Jamey Reas PT, DPT 09/17/2018, 6:07 AM  [facility] [address] [place], [place], [address] Phone: [phone]   Fax:  [phone]  Name: Jose Bush MRN: [id number] Date of Birth: [birth year]

## 2018-09-18 ENCOUNTER — Encounter: Payer: Self-pay | Admitting: Physical Therapy

## 2018-09-18 ENCOUNTER — Ambulatory Visit: Payer: Medicare HMO | Admitting: Physical Therapy

## 2018-09-18 DIAGNOSIS — R293 Abnormal posture: Secondary | ICD-10-CM | POA: Diagnosis not present

## 2018-09-18 DIAGNOSIS — R2689 Other abnormalities of gait and mobility: Secondary | ICD-10-CM

## 2018-09-18 DIAGNOSIS — R2681 Unsteadiness on feet: Secondary | ICD-10-CM | POA: Diagnosis not present

## 2018-09-18 DIAGNOSIS — M6281 Muscle weakness (generalized): Secondary | ICD-10-CM | POA: Diagnosis not present

## 2018-09-18 DIAGNOSIS — M25661 Stiffness of right knee, not elsewhere classified: Secondary | ICD-10-CM | POA: Diagnosis not present

## 2018-09-20 NOTE — Therapy (Signed)
[facility] [address] [place], [place], [address] Phone: [phone]   Fax:  [phone]  Physical Therapy Treatment  Patient Details  Name: Jose Bush MRN: [id number] Date of Birth: 01/03/1941 Referring Provider (PT): [name], MD   Encounter Date: 09/18/2018     09/18/18 1107  PT Visits / Re-Eval  Visit Number 8  Number of Visits 25  Date for PT Re-Evaluation 11/18/18  Authorization  Authorization Type [employer]  Authorization Time Period [id number] met, VL follow Medicare guidelines  PT Time Calculation  PT Start Time 1105  PT Stop Time 1145  PT Time Calculation (min) 40 min  PT - End of Session  Equipment Utilized During Treatment Gait belt  Activity Tolerance Patient tolerated treatment well;No increased pain  Behavior During Therapy WFL for tasks assessed/performed    Past Medical History:  Diagnosis Date  . Arthritis   . Chest pain, unspecified   . DVT (deep venous thrombosis) ([place])   . GERD (gastroesophageal reflux disease)   . Heart murmur    hx of   . History of hiatal hernia   . HTN (hypertension)   . Lupus ([place])   . Lupus ([place])   . Peripheral vascular disease (HCC)    nonviable tissue   . Shingles    hx of   . Stroke [facility]) 2010   'MILD" per pt-  weakness left side  . Tinnitus   . Ulcer of abdomen wall ([place])   . Wears dentures   . Wears glasses     Past Surgical History:  Procedure Laterality Date  . ABDOMINAL AORTOGRAM W/LOWER EXTREMITY N/A 02/17/2018   Procedure: ABDOMINAL AORTOGRAM W/LOWER EXTREMITY;  Surgeon: [name], MD;  Location: [place] [facility];  Service: Cardiovascular;  Laterality: N/A;  . AMPUTATION Right 03/03/2018   Procedure: AMPUTATION RIGHT SECOND TOE;  Surgeon: [name], MD;  Location: [place];  Service: Vascular;  Laterality: Right;  . AMPUTATION Right 03/18/2018   Procedure: AMPUTATION BELOW KNEE;  Surgeon: [name], MD;  Location: [place];  Service: Vascular;  Laterality: Right;  . BELOW KNEE LEG AMPUTATION Right 03/18/2018  . bilateral cataract surgery     . BIOPSY N/A 10/22/2017   Procedure: BIOPSY;  Surgeon: [name], MD;  [facility];  Service: Gastroenterology;  Laterality: N/A;  . COLONOSCOPY N/A 09/18/2016   Dr. [name]: normal  . ESOPHAGOGASTRODUODENOSCOPY N/A 10/22/2017   Dr. [name]: normal duodenum, gastric mucosal atrophy but negative H.pylori (Clotest negative), normal GE junction  . GIVENS CAPSULE STUDY N/A 01/27/2018   Procedure: GIVENS CAPSULE STUDY;  Surgeon: [name], MD;  [facility];  Service: Endoscopy;  Laterality: N/A;  7:00am  . HERNIA REPAIR     2010/laparoscopic/right  . ivc filter     01/2013   . KNEE ARTHROSCOPY Right    2007  . MULTIPLE TOOTH EXTRACTIONS    . PERIPHERAL VASCULAR BALLOON ANGIOPLASTY  02/17/2018   Procedure: PERIPHERAL VASCULAR BALLOON ANGIOPLASTY;  Surgeon: [name], MD;  Location: [place] [facility];  Service: Cardiovascular;;  . TOTAL HIP ARTHROPLASTY Right 05/01/2017   Procedure: RIGHT TOTAL HIP ARTHROPLASTY ANTERIOR APPROACH;  Surgeon: [name], MD;  [facility];  Service: Orthopedics;  Laterality: Right;  . TOTAL KNEE ARTHROPLASTY Left 12/13/2014   Procedure: LEFT TOTAL KNEE ARTHROPLASTY;  Surgeon: [name], MD;  [facility];  Service: Orthopedics;  Laterality: Left;  . TOTAL KNEE  ARTHROPLASTY Right 03/21/2015   Procedure: RIGHT TOTAL KNEE ARTHROPLASTY;  Surgeon: [name], MD;  [facility];  Service: Orthopedics;  Laterality: Right;    There were no vitals filed for this visit.     09/18/18 1106  Symptoms/Limitations  Subjective No new complaints. Using cane with rubber tip to therapy today.   Patient is accompained by: Family member  Pertinent History R TTA, R THA 05/01/17, R TKA 03/21/15, LTKA 12/13/14, PAD/PVD, OA, lupus, HTN, heart murmur, shingles, mild CVA,    Limitations Lifting;Standing;Walking;House hold activities  Patient Stated Goals To use prosthesis to return to yard work & being active in community.  Pain Assessment  Currently in Pain? No/denies  Pain Score 0      09/18/18 1108  Transfers  Transfers Sit to Stand;Stand to Sit  Sit to Stand 5: Supervision;With upper extremity assist;With armrests;From chair/3-in-1  Stand to Sit 5: Supervision;With upper extremity assist;With armrests;To chair/3-in-1  Ambulation/Gait  Ambulation/Gait Yes  Ambulation/Gait Assistance 5: Supervision;4: Min guard  Ambulation Distance (Feet) 100 Feet (x1, 120 x1 in/out doors, around gym with activity)  Assistive device Prosthesis;Straight cane  Gait Pattern Step-through pattern;Decreased stride length;Decreased step length - left;Decreased stance time - right;Decreased weight shift to right;Narrow base of support;Trunk flexed  Ambulation Surface Level;Indoor;Unlevel;Outdoor;Paved;Gravel;Grass;Other (comment) (rubber mulch)  High Level Balance  High Level Balance Activities Negotiating over obstacles;Side stepping;Marching forwards;Backward walking  High Level Balance Comments with cane/prosthesis- fwd stepping over bolsters of varied heights next to counter top with min assist/reminder cues on correct sequencing; in parallel bars with no to light UE support- fwd/bwd marching, side stepping x 3 laps each with min guard to min assist for balance. cues on posture and weight shifting.   Prosthetics  Current prosthetic wear tolerance (days/week)  daily  Current prosthetic wear tolerance (#hours/day)  all awake hours, drying every 4 hours as needed  Residual limb condition  intact per pt   Donning Prosthesis 5  Doffing Prosthesis 5     09/20/18 1808  Balance Exercises: Standing  Rockerboard Anterior/posterior;Lateral;EO;EC;30 seconds  Balance Exercises: Standing  Rebounder Limitations both ways on balance board: with EO- alternating UE raises, progressing  to bil UE raises. then EC no head movements. min guard to mid assist with no UE support, cues on posture/weight shifting to assist with balance recovery.        PT Short Term Goals - 09/17/18 9295      PT SHORT TERM GOAL #1   Title  Patient verbalizes adjusting ply socks with limb volume changes. (All updated STGs Target Dates: 10/17/2018)    Time  1    Period  Months    Status  New    Target Date  10/17/18      PT SHORT TERM GOAL #2   Title  Patient tolerates wear >90% of awake hours with PT cues for adjustments as needed.     Time  1    Period  Months    Status  Revised    Target Date  10/17/18      PT SHORT TERM GOAL #3   Title  Patient able to pick up water bottle sitting on floor without UE support with supervision.     Time  1    Period  Months    Status  New    Target Date  10/17/18      PT SHORT TERM GOAL #4   Title  Patient ambulates 300' with cane & prosthesis with supervision.  Time  1    Period  Months    Status  Revised    Target Date  10/17/18      PT SHORT TERM GOAL #5   Title  Patient negotiates ramps & curbs with cane and stairs 1 rail/cane with supervision.     Time  1    Period  Months    Status  Revised    Target Date  10/17/18      PT SHORT TERM GOAL #6   Title  Patient ambulates 100' with prosthesis only negotiating furniture with [place].     Time  1    Period  Months    Status  Revised    Target Date  10/17/18        PT Long Term Goals - 09/08/18 1800      PT LONG TERM GOAL #1   Title  Patient demonstrates & verbalizes understanding of prosthetic care to enable safe use. (All LTGs Target Date: 11/14/2018)    Time  12    Period  Weeks    Status  On-going    Target Date  11/14/18      PT LONG TERM GOAL #2   Title  Patient tolerates prosthesis wear >90% of awake hours without skin or limb pain issues to enable function throughout his day.     Time  12    Period  Weeks    Status  On-going    Target Date  11/14/18      PT LONG TERM  GOAL #3   Title  Berg Balance >36/56 to indicate lower fall risk.     Time  12    Period  Weeks    Status  On-going    Target Date  11/14/18      PT LONG TERM GOAL #4   Title  Patient ambulates 300' outdoors including grass with cane or less and prosthesis modified independent for community mobility.     Time  12    Period  Weeks    Status  On-going    Target Date  11/14/18      PT LONG TERM GOAL #5   Title  Patient negotiates ramps, curbs & stairs single rail with cane or less & prosthesis modified independent for community access.     Time  12    Period  Weeks    Status  On-going    Target Date  11/14/18      PT LONG TERM GOAL #6   Title  Patient ambulates with prosthesis only around furniture carrying household items modified independent for household mobility.     Time  12    Period  Weeks    Status  On-going    Target Date  11/14/18         09/18/18 1107  Plan  Clinical Impression Statement Today's skilled session focused on gait with cane/prosthesis on various surfaces and began to address balance with decreased UE support. The pt is progressing toward goals and should benefit from continued PT to progress toward unmet goals.    Pt will benefit from skilled therapeutic intervention in order to improve on the following deficits Abnormal gait;Decreased activity tolerance;Decreased balance;Decreased endurance;Decreased knowledge of use of DME;Decreased mobility;Decreased range of motion;Decreased scar mobility;Decreased strength;Dizziness;Increased edema;Impaired flexibility;Postural dysfunction;Prosthetic Dependency  Rehab Potential Good  PT Frequency 2x / week  PT Duration 12 weeks  PT Treatment/Interventions ADLs/Self Care Home Management;Canalith Repostioning;DME Instruction;Gait training;Stair training;Functional mobility training;Therapeutic activities;Therapeutic exercise;Balance training;Neuromuscular re-education;Patient/family  education;Prosthetic Training;Dry  needling;Vestibular  PT Next Visit Plan balance activities, continue prosthetic gait including ramp, curb & stairs, gait with cane quad tip, LE/core functional strengthening  Consulted and Agree with Plan of Care Patient;Family member/caregiver  Family Member Consulted wife        Patient will benefit from skilled therapeutic intervention in order to improve the following deficits and impairments:  Abnormal gait, Decreased activity tolerance, Decreased balance, Decreased endurance, Decreased knowledge of use of DME, Decreased mobility, Decreased range of motion, Decreased scar mobility, Decreased strength, Dizziness, Increased edema, Impaired flexibility, Postural dysfunction, Prosthetic Dependency  Visit Diagnosis: Other abnormalities of gait and mobility  Unsteadiness on feet  Abnormal posture  Muscle weakness (generalized)     Problem List Patient Active Problem List   Diagnosis Date Noted  . Non-healing wound of lower extremity 03/18/2018  . Nonhealing surgical wound 03/12/2018  . PAD (peripheral artery disease) ([place]) 03/03/2018  . PVD (peripheral vascular disease) ([place]) 03/03/2018  . Iron deficiency anemia   . Atrophic gastritis without hemorrhage   . OA (osteoarthritis) of hip 05/01/2017  . OA (osteoarthritis) of knee 12/13/2014  . Other pancytopenia ([place]) 02/04/2013  . Fever 02/02/2013  . Neutropenia ([place]) 02/02/2013  . Weakness 02/01/2013  . Rigors 02/01/2013  . Headache(784.0) 02/01/2013  . Lupus ([place]) 02/01/2013  . Thrombocytopenia ([place]) 02/01/2013  . DVT (deep venous thrombosis) ([place])   . HYPERTENSION, UNSPECIFIED 06/22/2009  . CHEST PAIN-UNSPECIFIED 06/22/2009    Willow Ora, PTA, [facility] [address], [place], [place] [address] [phone] 09/20/18, 5:56 PM   Name: Jose Bush MRN: [id number] Date of Birth: 1941/08/04

## 2018-09-22 DIAGNOSIS — J0100 Acute maxillary sinusitis, unspecified: Secondary | ICD-10-CM | POA: Diagnosis not present

## 2018-09-22 DIAGNOSIS — R05 Cough: Secondary | ICD-10-CM | POA: Diagnosis not present

## 2018-09-22 DIAGNOSIS — I82499 Acute embolism and thrombosis of other specified deep vein of unspecified lower extremity: Secondary | ICD-10-CM | POA: Diagnosis not present

## 2018-09-22 DIAGNOSIS — Z6829 Body mass index (BMI) 29.0-29.9, adult: Secondary | ICD-10-CM | POA: Diagnosis not present

## 2018-09-23 ENCOUNTER — Encounter: Payer: Medicare HMO | Admitting: Physical Therapy

## 2018-09-25 ENCOUNTER — Ambulatory Visit: Payer: Medicare HMO | Admitting: Physical Therapy

## 2018-09-26 DIAGNOSIS — J0100 Acute maxillary sinusitis, unspecified: Secondary | ICD-10-CM | POA: Diagnosis not present

## 2018-09-26 DIAGNOSIS — I82499 Acute embolism and thrombosis of other specified deep vein of unspecified lower extremity: Secondary | ICD-10-CM | POA: Diagnosis not present

## 2018-09-26 DIAGNOSIS — J209 Acute bronchitis, unspecified: Secondary | ICD-10-CM | POA: Diagnosis not present

## 2018-09-26 DIAGNOSIS — Z6831 Body mass index (BMI) 31.0-31.9, adult: Secondary | ICD-10-CM | POA: Diagnosis not present

## 2018-09-29 ENCOUNTER — Encounter: Payer: Self-pay | Admitting: Physical Therapy

## 2018-09-29 ENCOUNTER — Ambulatory Visit: Payer: Medicare HMO | Admitting: Physical Therapy

## 2018-09-29 DIAGNOSIS — R2689 Other abnormalities of gait and mobility: Secondary | ICD-10-CM

## 2018-09-29 DIAGNOSIS — R2681 Unsteadiness on feet: Secondary | ICD-10-CM | POA: Diagnosis not present

## 2018-09-29 DIAGNOSIS — R293 Abnormal posture: Secondary | ICD-10-CM

## 2018-09-29 DIAGNOSIS — M6281 Muscle weakness (generalized): Secondary | ICD-10-CM | POA: Diagnosis not present

## 2018-09-29 DIAGNOSIS — M25661 Stiffness of right knee, not elsewhere classified: Secondary | ICD-10-CM | POA: Diagnosis not present

## 2018-09-30 NOTE — Therapy (Signed)
[facility] [address] [place], [place], [address] Phone: [phone]   Fax:  [phone]  Physical Therapy Treatment  Patient Details  Name: Jose Bush MRN: [id number] Date of Birth: [birth year] Referring Provider (PT): [name], MD   Encounter Date: 09/29/2018  PT End of Session - 09/29/18 1634    Visit Number  9    Number of Visits  25    Date for PT Re-Evaluation  11/18/18    Authorization Type  [employer] Medicare    Authorization Time Period  $[id number] met, VL follow Medicare guidelines    PT Start Time  3762    PT Stop Time  1523    PT Time Calculation (min)  38 min    Equipment Utilized During Treatment  Gait belt    Activity Tolerance  Patient tolerated treatment well;No increased pain    Behavior During Therapy  WFL for tasks assessed/performed       Past Medical History:  Diagnosis Date  . Arthritis   . Chest pain, unspecified   . DVT (deep venous thrombosis) ([place])   . GERD (gastroesophageal reflux disease)   . Heart murmur    hx of   . History of hiatal hernia   . HTN (hypertension)   . Lupus ([place])   . Lupus ([place])   . Peripheral vascular disease (HCC)    nonviable tissue   . Shingles    hx of   . Stroke [facility]) 2010   'MILD" per pt-  weakness left side  . Tinnitus   . Ulcer of abdomen wall ([place])   . Wears dentures   . Wears glasses     Past Surgical History:  Procedure Laterality Date  . ABDOMINAL AORTOGRAM W/LOWER EXTREMITY N/A 02/17/2018   Procedure: ABDOMINAL AORTOGRAM W/LOWER EXTREMITY;  Surgeon: [name], MD;  Location: [facility];  Service: Cardiovascular;  Laterality: N/A;  . AMPUTATION Right 03/03/2018   Procedure: AMPUTATION RIGHT SECOND TOE;  Surgeon: [name], MD;  Location: [facility];  Service: Vascular;  Laterality: Right;  . AMPUTATION Right 03/18/2018   Procedure: AMPUTATION BELOW KNEE;  Surgeon: [name], MD;  Location: [facility];   Service: Vascular;  Laterality: Right;  . BELOW KNEE LEG AMPUTATION Right 03/18/2018  . bilateral cataract surgery     . BIOPSY N/A 10/22/2017   Procedure: BIOPSY;  Surgeon: [name], MD;  [facility];  Service: Gastroenterology;  Laterality: N/A;  . COLONOSCOPY N/A 09/18/2016   Dr. [name]: normal  . ESOPHAGOGASTRODUODENOSCOPY N/A 10/22/2017   Dr. [name]: normal duodenum, gastric mucosal atrophy but negative H.pylori (Clotest negative), normal GE junction  . GIVENS CAPSULE STUDY N/A 01/27/2018   Procedure: GIVENS CAPSULE STUDY;  Surgeon: [name], MD;  [facility];  Service: Endoscopy;  Laterality: N/A;  7:00am  . HERNIA REPAIR     2010/laparoscopic/right  . ivc filter     01/2013   . KNEE ARTHROSCOPY Right    2007  . MULTIPLE TOOTH EXTRACTIONS    . PERIPHERAL VASCULAR BALLOON ANGIOPLASTY  02/17/2018   Procedure: PERIPHERAL VASCULAR BALLOON ANGIOPLASTY;  Surgeon: [name], MD;  Location: [facility];  Service: Cardiovascular;;  . TOTAL HIP ARTHROPLASTY Right 05/01/2017   Procedure: RIGHT TOTAL HIP ARTHROPLASTY ANTERIOR APPROACH;  Surgeon: [name], MD;  [facility];  Service: Orthopedics;  Laterality: Right;  . TOTAL KNEE ARTHROPLASTY Left 12/13/2014   Procedure: LEFT TOTAL  KNEE ARTHROPLASTY;  Surgeon: [name], MD;  [facility];  Service: Orthopedics;  Laterality: Left;  . TOTAL KNEE ARTHROPLASTY Right 03/21/2015   Procedure: RIGHT TOTAL KNEE ARTHROPLASTY;  Surgeon: [name], MD;  [facility];  Service: Orthopedics;  Laterality: Right;    There were no vitals filed for this visit.  Subjective Assessment - 09/29/18 1446    Subjective  Prosthetist had him remove prosthesis for 2 days but he has been back to wearing prosthesis for week. He has upper respiratory infection & light pneumonia which has flared his Lupus. They increased his Pretizone from 77m to 429mfor 5 days with 2 more to go. He is using  walker again.     Patient is accompained by:  Family member    Pertinent History  R TTA, R THA 05/01/17, R TKA 03/21/15, LTKA 12/13/14, PAD/PVD, OA, lupus, HTN, heart murmur, shingles, mild CVA,     Limitations  Lifting;Standing;Walking;House hold activities    Patient Stated Goals  To use prosthesis to return to yard work & being active in community.    Currently in Pain?  No/denies                       [facility] - [facility]18 1445      Transfers   Comments  Sit to / from stand from 24" stool without UE assist 10 reps.       Ambulation/Gait   Ambulation/Gait  Yes    Ambulation/Gait Assistance  5: Supervision    Ambulation Distance (Feet)  100 Feet   100' X2   Assistive device  Rollator;Prosthesis    Ambulation Surface  Indoor;Level      Prosthetics   Prosthetic Care Comments   PT instructed with demo in placing umbrella of prosthetic liner more posteriorly to displace some of his soft tissue towards the distal tibia.     Current prosthetic wear tolerance (days/week)   daily    Current prosthetic wear tolerance (#hours/day)   all awake hours, drying every 4 hours as needed    Residual limb condition   intact with no open areas, color changes or temperature changes    Education Provided  Skin check;Proper Donning;Other (comment)   see prosthetic care comments   Person(s) Educated  Patient;Spouse    Education Method  Explanation;Demonstration;Verbal cues    Education Method  Verbalized understanding          Balance Exercises - 09/29/18 1445      Balance Exercises: Standing   Standing Eyes Opened  Wide (BOA);Solid surface;Head turns;5 reps    Standing Eyes Closed  Wide (BOA);Solid surface;Head turns;5 reps    Other Standing Exercises  standing with wide stance solid surface red theraband:  alternate UEs & BUEs 10 reps ea. rows, biceps curls & forward reach.  LUE resistance decreased due old CVA weakness / abnormal movement patterns.            PT Short Term Goals - 09/17/18 [id number]    PT SHORT TERM GOAL #1   Title  Patient verbalizes adjusting ply socks with limb volume changes. (All updated STGs Target Dates: 10/17/2018)    Time  1    Period  Months    Status  New    Target Date  10/17/18      PT SHORT TERM GOAL #2   Title  Patient tolerates wear >90% of awake hours with PT cues for adjustments as  needed.     Time  1    Period  Months    Status  Revised    Target Date  10/17/18      PT SHORT TERM GOAL #3   Title  Patient able to pick up water bottle sitting on floor without UE support with supervision.     Time  1    Period  Months    Status  New    Target Date  10/17/18      PT SHORT TERM GOAL #4   Title  Patient ambulates 300' with cane & prosthesis with supervision.     Time  1    Period  Months    Status  Revised    Target Date  10/17/18      PT SHORT TERM GOAL #5   Title  Patient negotiates ramps & curbs with cane and stairs 1 rail/cane with supervision.     Time  1    Period  Months    Status  Revised    Target Date  10/17/18      PT SHORT TERM GOAL #6   Title  Patient ambulates 100' with prosthesis only negotiating furniture with [place].     Time  1    Period  Months    Status  Revised    Target Date  10/17/18        PT Long Term Goals - 09/08/18 1800      PT LONG TERM GOAL #1   Title  Patient demonstrates & verbalizes understanding of prosthetic care to enable safe use. (All LTGs Target Date: 11/14/2018)    Time  12    Period  Weeks    Status  On-going    Target Date  11/14/18      PT LONG TERM GOAL #2   Title  Patient tolerates prosthesis wear >90% of awake hours without skin or limb pain issues to enable function throughout his day.     Time  12    Period  Weeks    Status  On-going    Target Date  11/14/18      PT LONG TERM GOAL #3   Title  Berg Balance >36/56 to indicate lower fall risk.     Time  12    Period  Weeks    Status  On-going    Target Date  11/14/18       PT LONG TERM GOAL #4   Title  Patient ambulates 300' outdoors including grass with cane or less and prosthesis modified independent for community mobility.     Time  12    Period  Weeks    Status  On-going    Target Date  11/14/18      PT LONG TERM GOAL #5   Title  Patient negotiates ramps, curbs & stairs single rail with cane or less & prosthesis modified independent for community access.     Time  12    Period  Weeks    Status  On-going    Target Date  11/14/18      PT LONG TERM GOAL #6   Title  Patient ambulates with prosthesis only around furniture carrying household items modified independent for household mobility.     Time  12    Period  Weeks    Status  On-going    Target Date  11/14/18            Plan - 09/29/18 1830  Clinical Impression Statement  Today's session focused on balance. Patient had decline in mobility with recent upper respiratory infection & excerbation of his Lupus. He arrived using his rollator due to decline & PT advised to continue with use for now until he starts to recover. Pt tolerated standing balance activities with rests as needed. Placing umbrella of prosthetic liner posteriorly to shift his soft tissue towards distal tibia seemed to help limb pain along with changes prosthetist made.     Rehab Potential  Good    PT Frequency  2x / week    PT Duration  12 weeks    PT Treatment/Interventions  ADLs/Self Care Home Management;Canalith Repostioning;DME Instruction;Gait training;Stair training;Functional mobility training;Therapeutic activities;Therapeutic exercise;Balance training;Neuromuscular re-education;Patient/family education;Prosthetic Training;Dry needling;Vestibular    PT Next Visit Plan  balance activities, continue prosthetic gait including ramp, curb & stairs, gait with cane quad tip as able with recent respiratory issues, LE/core functional strengthening    Consulted and Agree with Plan of Care  Patient;Family member/caregiver    Family  Member Consulted  wife       Patient will benefit from skilled therapeutic intervention in order to improve the following deficits and impairments:  Abnormal gait, Decreased activity tolerance, Decreased balance, Decreased endurance, Decreased knowledge of use of DME, Decreased mobility, Decreased range of motion, Decreased scar mobility, Decreased strength, Dizziness, Increased edema, Impaired flexibility, Postural dysfunction, Prosthetic Dependency  Visit Diagnosis: Other abnormalities of gait and mobility  Unsteadiness on feet  Abnormal posture  Muscle weakness (generalized)  Stiffness of right knee, not elsewhere classified     Problem List Patient Active Problem List   Diagnosis Date Noted  . Non-healing wound of lower extremity 03/18/2018  . Nonhealing surgical wound 03/12/2018  . PAD (peripheral artery disease) ([place]) 03/03/2018  . PVD (peripheral vascular disease) ([place]) 03/03/2018  . Iron deficiency anemia   . Atrophic gastritis without hemorrhage   . OA (osteoarthritis) of hip 05/01/2017  . OA (osteoarthritis) of knee 12/13/2014  . Other pancytopenia ([place]) 02/04/2013  . Fever 02/02/2013  . Neutropenia ([place]) 02/02/2013  . Weakness 02/01/2013  . Rigors 02/01/2013  . Headache(784.0) 02/01/2013  . Lupus ([place]) 02/01/2013  . Thrombocytopenia ([place]) 02/01/2013  . DVT (deep venous thrombosis) ([place])   . HYPERTENSION, UNSPECIFIED 06/22/2009  . CHEST PAIN-UNSPECIFIED 06/22/2009    Jose Bush  PT, DPT 09/30/2018, 8:41 AM  [facility] [address] [place], [place], [address] Phone: [phone]   Fax:  [phone]  Name: Jose Bush MRN: [id number] Date of Birth: 08-24-1941

## 2018-10-02 DIAGNOSIS — I82503 Chronic embolism and thrombosis of unspecified deep veins of lower extremity, bilateral: Secondary | ICD-10-CM | POA: Diagnosis not present

## 2018-10-03 ENCOUNTER — Ambulatory Visit: Payer: Medicare HMO

## 2018-10-07 ENCOUNTER — Ambulatory Visit: Payer: Medicare HMO | Attending: Vascular Surgery

## 2018-10-07 DIAGNOSIS — M25661 Stiffness of right knee, not elsewhere classified: Secondary | ICD-10-CM | POA: Insufficient documentation

## 2018-10-07 DIAGNOSIS — R2681 Unsteadiness on feet: Secondary | ICD-10-CM | POA: Insufficient documentation

## 2018-10-07 DIAGNOSIS — M6281 Muscle weakness (generalized): Secondary | ICD-10-CM | POA: Insufficient documentation

## 2018-10-07 DIAGNOSIS — R2689 Other abnormalities of gait and mobility: Secondary | ICD-10-CM | POA: Insufficient documentation

## 2018-10-07 DIAGNOSIS — R293 Abnormal posture: Secondary | ICD-10-CM | POA: Insufficient documentation

## 2018-10-07 NOTE — Therapy (Addendum)
[facility] [address] [place], [place], [address] Phone: [phone]   Fax:  [phone]  Physical Therapy Treatment  Patient Details  Name: Jose Bush MRN: [id number] Date of Birth: 1941-04-26 Referring Provider (PT): [name], MD   Encounter Date: 10/07/2018   Progress Note Reporting Period 08/20/2018 to 10/07/2018  See note below for Objective Data and Assessment of Progress/Goals.   Jamey Reas, PT, DPT PT Specializing in [place] 10/14/18 8:45 AM Phone:  [phone]  Fax:  [phone] [facility] [address] [place], [place] [address]    PT End of Session - 10/07/18 1457    Visit Number  10    Number of Visits  25    Date for PT Re-Evaluation  11/18/18    Authorization Type  [employer]    Authorization Time Period  $3400oop met, VL follow Medicare guidelines    PT Start Time  1446    PT Stop Time  1531    PT Time Calculation (min)  45 min    Equipment Utilized During Treatment  Gait belt    Activity Tolerance  Patient tolerated treatment well;No increased pain    Behavior During Therapy  WFL for tasks assessed/performed       Past Medical History:  Diagnosis Date  . Arthritis   . Chest pain, unspecified   . DVT (deep venous thrombosis) ([place])   . GERD (gastroesophageal reflux disease)   . Heart murmur    hx of   . History of hiatal hernia   . HTN (hypertension)   . Lupus ([place])   . Lupus ([place])   . Peripheral vascular disease ([facility])    nonviable tissue   . Shingles    hx of   . Stroke [facility]) 2010   'MILD" per pt-  weakness left side  . Tinnitus   . Ulcer of abdomen wall ([place])   . Wears dentures   . Wears glasses     Past Surgical History:  Procedure Laterality Date  . ABDOMINAL AORTOGRAM W/LOWER EXTREMITY N/A 02/17/2018   Procedure: ABDOMINAL AORTOGRAM W/LOWER EXTREMITY;  Surgeon: [name], MD;  Location: [facility];  Service: Cardiovascular;  Laterality: N/A;  . AMPUTATION Right 03/03/2018   Procedure: AMPUTATION RIGHT SECOND TOE;  Surgeon: [name], MD;  Location: [facility];  Service: Vascular;  Laterality: Right;  . AMPUTATION Right 03/18/2018   Procedure: AMPUTATION BELOW KNEE;  Surgeon: [name], MD;  Location: [facility];  Service: Vascular;  Laterality: Right;  . BELOW KNEE LEG AMPUTATION Right 03/18/2018  . bilateral cataract surgery     . BIOPSY N/A 10/22/2017   Procedure: BIOPSY;  Surgeon: [name], MD;  [facility];  Service: Gastroenterology;  Laterality: N/A;  . COLONOSCOPY N/A 09/18/2016   Dr. [name]: normal  . ESOPHAGOGASTRODUODENOSCOPY N/A 10/22/2017   Dr. [name]: normal duodenum, gastric mucosal atrophy but negative H.pylori (Clotest negative), normal GE junction  . GIVENS CAPSULE STUDY N/A 01/27/2018   Procedure: GIVENS CAPSULE STUDY;  Surgeon: [name], MD;  [facility];  Service: Endoscopy;  Laterality: N/A;  7:00am  . HERNIA REPAIR     2010/laparoscopic/right  . ivc filter     01/2013   . KNEE ARTHROSCOPY Right    2007  . MULTIPLE TOOTH EXTRACTIONS    . PERIPHERAL VASCULAR BALLOON ANGIOPLASTY  02/17/2018   Procedure: PERIPHERAL VASCULAR BALLOON ANGIOPLASTY;  Surgeon: [name], MD;  Location: [facility];  Service: Cardiovascular;;  . TOTAL HIP ARTHROPLASTY Right 05/01/2017   Procedure: RIGHT TOTAL HIP ARTHROPLASTY ANTERIOR APPROACH;  Surgeon: [name], MD;  Location: [facility];  Service: Orthopedics;  Laterality: Right;  . TOTAL KNEE ARTHROPLASTY Left 12/13/2014   Procedure: LEFT TOTAL KNEE ARTHROPLASTY;  Surgeon: [name], MD;  Location: [facility];  Service: Orthopedics;  Laterality: Left;  . TOTAL KNEE ARTHROPLASTY Right 03/21/2015   Procedure: RIGHT TOTAL KNEE ARTHROPLASTY;  Surgeon: [name], MD;  Location: [facility];  Service: Orthopedics;  Laterality: Right;    There were no vitals filed  for this visit.  Subjective Assessment - 10/07/18 1450    Subjective  No falls to report, HEP is going well. Pt has been sick and just finished antibiotics, reports feeling better and back to using his cane/ performing exercises. Just left appt at [facility], had padding added to prosthesis.     Patient is accompained by:  Family member    Pertinent History  R TTA, R THA 05/01/17, R TKA 03/21/15, LTKA 12/13/14, PAD/PVD, OA, lupus, HTN, heart murmur, shingles, mild CVA,     Limitations  Lifting;Standing;Walking;House hold activities    Patient Stated Goals  To use prosthesis to return to yard work & being active in community.    Currently in Pain?  No/denies        [facility] Adult PT Treatment/Exercise - 10/07/18 1458      Ambulation/Gait   Ambulation/Gait  Yes    Ambulation/Gait Assistance  4: Min guard    Ambulation/Gait Assistance Details  Pt presents with decrease in weight shift onto prosthesis due to increased pressure/pain with gait, min guar required with VC's to decrease SUE reliance, increase WS and posture.     Ambulation Distance (Feet)  115 Feet    Assistive device  Straight cane;Prosthesis   with rubber quad tip   Gait Pattern  Step-through pattern;Decreased stride length;Decreased step length - left;Decreased stance time - right;Decreased weight shift to right;Narrow base of support;Trunk flexed    Ambulation Surface  Level;Indoor    Stairs  Yes    Stairs Assistance  4: Min guard;4: Min assist    Stairs Assistance Details (indicate cue type and reason)  Min guard ascending/min assist descending, pt initial trail with bil rail use progressing to R rail/L cane, pt attempted L cane only and required mod assist to ascend stairs.     Stair Management Technique  One rail Right;Alternating pattern;Forwards;With cane;Other (comment)   quad tipped cane/prosthesis   Number of Stairs  4    Height of Stairs  6    Ramp  Other (comment)   min guard   Curb  4: Min assist;Other (comment)   min  guard   Curb Details (indicate cue type and reason)  min guard descending, min assist ascending after multiple attempts prior to advancement.       Neuro Re-ed    Neuro Re-ed Details   Pt in parallel bars on rockerboard with EO/EC ant/post/last, pt continues to present with strong ant lean with tactile feedback provided through gait belt for pt correction.          PT Short Term Goals - 09/17/18 3291      PT SHORT TERM GOAL #1   Title  Patient verbalizes adjusting ply socks with limb volume changes. (All updated STGs Target Dates: 10/17/2018)    Time  1    Period  Months  Status  New    Target Date  10/17/18      PT SHORT TERM GOAL #2   Title  Patient tolerates wear >90% of awake hours with PT cues for adjustments as needed.     Time  1    Period  Months    Status  Revised    Target Date  10/17/18      PT SHORT TERM GOAL #3   Title  Patient able to pick up water bottle sitting on floor without UE support with supervision.     Time  1    Period  Months    Status  New    Target Date  10/17/18      PT SHORT TERM GOAL #4   Title  Patient ambulates 300' with cane & prosthesis with supervision.     Time  1    Period  Months    Status  Revised    Target Date  10/17/18      PT SHORT TERM GOAL #5   Title  Patient negotiates ramps & curbs with cane and stairs 1 rail/cane with supervision.     Time  1    Period  Months    Status  Revised    Target Date  10/17/18      PT SHORT TERM GOAL #6   Title  Patient ambulates 100' with prosthesis only negotiating furniture with [place].     Time  1    Period  Months    Status  Revised    Target Date  10/17/18        PT Long Term Goals - 09/08/18 1800      PT LONG TERM GOAL #1   Title  Patient demonstrates & verbalizes understanding of prosthetic care to enable safe use. (All LTGs Target Date: 11/14/2018)    Time  12    Period  Weeks    Status  On-going    Target Date  11/14/18      PT LONG TERM GOAL #2   Title  Patient  tolerates prosthesis wear >90% of awake hours without skin or limb pain issues to enable function throughout his day.     Time  12    Period  Weeks    Status  On-going    Target Date  11/14/18      PT LONG TERM GOAL #3   Title  Berg Balance >36/56 to indicate lower fall risk.     Time  12    Period  Weeks    Status  On-going    Target Date  11/14/18      PT LONG TERM GOAL #4   Title  Patient ambulates 300' outdoors including grass with cane or less and prosthesis modified independent for community mobility.     Time  12    Period  Weeks    Status  On-going    Target Date  11/14/18      PT LONG TERM GOAL #5   Title  Patient negotiates ramps, curbs & stairs single rail with cane or less & prosthesis modified independent for community access.     Time  12    Period  Weeks    Status  On-going    Target Date  11/14/18      PT LONG TERM GOAL #6   Title  Patient ambulates with prosthesis only around furniture carrying household items modified independent for household mobility.     Time  12    Period  Weeks    Status  On-going    Target Date  11/14/18        Plan - 10/07/18 1609    Clinical Impression Statement  Todays skilled session focused on gait training with quad tipped cane while negotiating curbs, ramps & stairs, and high level balance on rockerboard with EO/EC min/mod instability. [name] stated due to living 1hr away they may want to transfer facilities but are willing to try reducing down to 1x week prior to transfer. Pt should benefit from continued PT sessions to progress towards LTG's.     Rehab Potential  Good    PT Frequency  2x / week    PT Duration  12 weeks    PT Treatment/Interventions  ADLs/Self Care Home Management;Canalith Repostioning;DME Instruction;Gait training;Stair training;Functional mobility training;Therapeutic activities;Therapeutic exercise;Balance training;Neuromuscular re-education;Patient/family education;Prosthetic Training;Dry  needling;Vestibular    PT Next Visit Plan  balance activities, continue prosthetic gait including ramp, curb & stairs, gait with cane quad tip as able with recent respiratory issues, LE/core functional strengthening    Consulted and Agree with Plan of Care  Patient;Family member/caregiver    Family Member Consulted  wife       Patient will benefit from skilled therapeutic intervention in order to improve the following deficits and impairments:  Abnormal gait, Decreased activity tolerance, Decreased balance, Decreased endurance, Decreased knowledge of use of DME, Decreased mobility, Decreased range of motion, Decreased scar mobility, Decreased strength, Dizziness, Increased edema, Impaired flexibility, Postural dysfunction, Prosthetic Dependency  Visit Diagnosis: Other abnormalities of gait and mobility  Unsteadiness on feet  Muscle weakness (generalized)     Problem List Patient Active Problem List   Diagnosis Date Noted  . Non-healing wound of lower extremity 03/18/2018  . Nonhealing surgical wound 03/12/2018  . PAD (peripheral artery disease) ([place]) 03/03/2018  . PVD (peripheral vascular disease) ([place]) 03/03/2018  . Iron deficiency anemia   . Atrophic gastritis without hemorrhage   . OA (osteoarthritis) of hip 05/01/2017  . OA (osteoarthritis) of knee 12/13/2014  . Other pancytopenia ([place]) 02/04/2013  . Fever 02/02/2013  . Neutropenia ([place]) 02/02/2013  . Weakness 02/01/2013  . Rigors 02/01/2013  . Headache(784.0) 02/01/2013  . Lupus ([place]) 02/01/2013  . Thrombocytopenia ([place]) 02/01/2013  . DVT (deep venous thrombosis) ([place])   . HYPERTENSION, UNSPECIFIED 06/22/2009  . CHEST PAIN-UNSPECIFIED 06/22/2009    , PTA   A  10/07/2018, 4:16 PM  [facility] [address] [place], [place], [address] Phone: [phone]   Fax:  [phone]  Name: Jose Bush MRN: [id number] Date of Birth:  03-03-1941

## 2018-10-09 DIAGNOSIS — I82503 Chronic embolism and thrombosis of unspecified deep veins of lower extremity, bilateral: Secondary | ICD-10-CM | POA: Diagnosis not present

## 2018-10-09 DIAGNOSIS — I82499 Acute embolism and thrombosis of other specified deep vein of unspecified lower extremity: Secondary | ICD-10-CM | POA: Diagnosis not present

## 2018-10-09 DIAGNOSIS — S81809A Unspecified open wound, unspecified lower leg, initial encounter: Secondary | ICD-10-CM | POA: Diagnosis not present

## 2018-10-16 ENCOUNTER — Encounter: Payer: Self-pay | Admitting: Physical Therapy

## 2018-10-16 ENCOUNTER — Ambulatory Visit: Payer: Medicare HMO | Admitting: Physical Therapy

## 2018-10-16 DIAGNOSIS — R2681 Unsteadiness on feet: Secondary | ICD-10-CM | POA: Diagnosis not present

## 2018-10-16 DIAGNOSIS — R2689 Other abnormalities of gait and mobility: Secondary | ICD-10-CM

## 2018-10-16 DIAGNOSIS — M6281 Muscle weakness (generalized): Secondary | ICD-10-CM

## 2018-10-16 DIAGNOSIS — M25661 Stiffness of right knee, not elsewhere classified: Secondary | ICD-10-CM | POA: Diagnosis not present

## 2018-10-16 DIAGNOSIS — R293 Abnormal posture: Secondary | ICD-10-CM | POA: Diagnosis not present

## 2018-10-16 NOTE — Therapy (Addendum)
[facility] [address] [place], [place], [address] Phone: [phone]   Fax:  [phone]  Physical Therapy Treatment & Discharge Summary  Patient Details  Name: Jose Bush MRN: [id number] Date of Birth: Sep 14, 1941 Referring Provider (PT): [name], MD  PHYSICAL THERAPY DISCHARGE SUMMARY  Visits from Start of Care: 11  Current functional level related to goals / functional outcomes: See below   Remaining deficits: See Below   Education / Equipment: Prosthetic care & HEP  Plan: Patient agrees to discharge.  Patient goals were met. Patient is being discharged due to meeting the stated rehab goals.  ?????         Encounter Date: 10/16/2018  PT End of Session - 10/16/18 2126    Visit Number  11    Number of Visits  25    Date for PT Re-Evaluation  11/18/18    Authorization Type  [employer]    Authorization Time Period  $[id number] met, VL follow Medicare guidelines    PT Start Time  1100    PT Stop Time  1147    PT Time Calculation (min)  47 min    Equipment Utilized During Treatment  Gait belt    Activity Tolerance  Patient tolerated treatment well;No increased pain    Behavior During Therapy  WFL for tasks assessed/performed       Past Medical History:  Diagnosis Date  . Arthritis   . Chest pain, unspecified   . DVT (deep venous thrombosis) ([place])   . GERD (gastroesophageal reflux disease)   . Heart murmur    hx of   . History of hiatal hernia   . HTN (hypertension)   . Lupus ([place])   . Lupus ([place])   . Peripheral vascular disease (H[facility])    nonviable tissue   . Shingles    hx of   . Stroke [facility]) 2010   'MILD" per pt-  weakness left side  . Tinnitus   . Ulcer of abdomen wall ([place])   . Wears dentures   . Wears glasses     Past Surgical History:  Procedure Laterality Date  . ABDOMINAL AORTOGRAM W/LOWER EXTREMITY N/A 02/17/2018   Procedure: ABDOMINAL AORTOGRAM W/LOWER  EXTREMITY;  Surgeon: [name], MD;  Location: [facility];  Service: Cardiovascular;  Laterality: N/A;  . AMPUTATION Right 03/03/2018   Procedure: AMPUTATION RIGHT SECOND TOE;  Surgeon: [name], MD;  Location: [facility];  Service: Vascular;  Laterality: Right;  . AMPUTATION Right 03/18/2018   Procedure: AMPUTATION BELOW KNEE;  Surgeon: [name], MD;  Location: [facility];  Service: Vascular;  Laterality: Right;  . BELOW KNEE LEG AMPUTATION Right 03/18/2018  . bilateral cataract surgery     . BIOPSY N/A 10/22/2017   Procedure: BIOPSY;  Surgeon: [name], MD;  [facility];  Service: Gastroenterology;  Laterality: N/A;  . COLONOSCOPY N/A 09/18/2016   Dr. [name]: normal  . ESOPHAGOGASTRODUODENOSCOPY N/A 10/22/2017   Dr. [name]: normal duodenum, gastric mucosal atrophy but negative H.pylori (Clotest negative), normal GE junction  . GIVENS CAPSULE STUDY N/A 01/27/2018   Procedure: GIVENS CAPSULE STUDY;  Surgeon: [name], MD;  [facility];  Service: Endoscopy;  Laterality: N/A;  7:00am  . HERNIA REPAIR     2010/laparoscopic/right  . ivc filter     01/2013   . KNEE ARTHROSCOPY Right    2007  . MULTIPLE TOOTH EXTRACTIONS    .  PERIPHERAL VASCULAR BALLOON ANGIOPLASTY  02/17/2018   Procedure: PERIPHERAL VASCULAR BALLOON ANGIOPLASTY;  Surgeon: [name], MD;  Location: [facility];  Service: Cardiovascular;;  . TOTAL HIP ARTHROPLASTY Right 05/01/2017   Procedure: RIGHT TOTAL HIP ARTHROPLASTY ANTERIOR APPROACH;  Surgeon: [name], MD;  Location: [facility];  Service: Orthopedics;  Laterality: Right;  . TOTAL KNEE ARTHROPLASTY Left 12/13/2014   Procedure: LEFT TOTAL KNEE ARTHROPLASTY;  Surgeon: [name], MD;  Location: [facility];  Service: Orthopedics;  Laterality: Left;  . TOTAL KNEE ARTHROPLASTY Right 03/21/2015   Procedure: RIGHT TOTAL KNEE ARTHROPLASTY;  Surgeon: [name], MD;  Location: [facility];  Service:  Orthopedics;  Laterality: Right;    There were no vitals filed for this visit.  Subjective Assessment - 10/16/18 1103    Subjective  He has been walking in house without cane. No falls.     Patient is accompained by:  Family member    Pertinent History  R TTA, R THA 05/01/17, R TKA 03/21/15, LTKA 12/13/14, PAD/PVD, OA, lupus, HTN, heart murmur, shingles, mild CVA,     Limitations  Lifting;Standing;Walking;House hold activities    Patient Stated Goals  To use prosthesis to return to yard work & being active in community.    Currently in Pain?  No/denies         [facility] PT Assessment - 10/16/18 1100      Berg Balance Test   Sit to Stand  Able to stand  independently using hands    Standing Unsupported  Able to stand 2 minutes with supervision    Sitting with Back Unsupported but Feet Supported on Floor or Stool  Able to sit safely and securely 2 minutes    Stand to Sit  Controls descent by using hands    Transfers  Able to transfer safely, definite need of hands    Standing Unsupported with Eyes Closed  Able to stand 10 seconds with supervision    Standing Ubsupported with Feet Together  Able to place feet together independently and stand for 1 minute with supervision    From Standing, Reach Forward with Outstretched Arm  Can reach forward >12 cm safely (5")    From Standing Position, Pick up Object from [place] to pick up shoe, needs supervision    From Standing Position, Turn to Look Behind Over each Shoulder  Looks behind one side only/other side shows less weight shift    Turn 360 Degrees  Able to turn 360 degrees safely but slowly    Standing Unsupported, Alternately Place Feet on Step/Stool  Able to complete >2 steps/needs minimal assist    Standing Unsupported, One Foot in Front  Able to take small step independently and hold 30 seconds    Standing on One Leg  Tries to lift leg/unable to hold 3 seconds but remains standing independently    Total Score  37    Berg comment:   Initial Berg Balance 16/56      Prosthetics Assessment - 10/16/18 [place] with  Skin check;Residual limb care;Prosthetic cleaning;Ply sock cleaning;Correct ply sock adjustment;Proper wear schedule/adjustment;Proper weight-bearing schedule/adjustment    Prosthetic Care Comments   PT instructed in follow-up care with prosthetist.     Donning prosthesis   Modified independent (Device/Increase time)    Doffing prosthesis   Modified independent (Device/Increase time)    Current prosthetic wear tolerance (days/week)   daily    Current prosthetic  wear tolerance (#hours/day)   >90% of awake hours    Edema  pitting edema    Residual limb condition   no open areas, normal color, temperature. Using antiperspirant to control sweating                  [facility] Adult PT Treatment/Exercise - 10/16/18 1100      Transfers   Transfers  Sit to Stand;Stand to Sit    Sit to Stand  6: Modified independent (Device/Increase time);With upper extremity assist;With armrests;From chair/3-in-1    Stand to Sit  6: Modified independent (Device/Increase time);With upper extremity assist;With armrests;To chair/3-in-1      Ambulation/Gait   Ambulation/Gait  Yes    Ambulation/Gait Assistance  6: Modified independent (Device/Increase time)    Ambulation Distance (Feet)  300 Feet   300' outdoors cane, 120' prosthesis only carrying plate   Assistive device  Straight cane;Prosthesis;None   with rubber quad tip   Gait Pattern  Step-through pattern;Decreased stride length;Decreased step length - left;Decreased stance time - right;Decreased weight shift to right    Ambulation Surface  Indoor;Level;Outdoor;Paved;Gravel;Grass    Stairs  Yes    Stairs Assistance  6: Modified independent (Device/Increase time)    Stair Management Technique  One rail Right;Forwards;With cane;Other (comment);Step to pattern   quad tipped cane/prosthesis   Number of Stairs  4    Height of  Stairs  6    Ramp  6: Modified independent (Device)   cane & prosthesis   Curb  6: Modified independent (Device/increase time)   cane & prosthesis              PT Short Term Goals - 10/16/18 2126      PT SHORT TERM GOAL #1   Title  Patient verbalizes adjusting ply socks with limb volume changes. (All updated STGs Target Dates: 10/17/2018)    Baseline  MET 10/16/2018    Time  1    Period  Months    Status  Achieved      PT SHORT TERM GOAL #2   Title  Patient tolerates wear >90% of awake hours with PT cues for adjustments as needed.     Baseline  MET 10/16/2018    Time  1    Period  Months    Status  Achieved      PT SHORT TERM GOAL #3   Title  Patient able to pick up water bottle sitting on floor without UE support with supervision.     Baseline  MET 10/16/2018    Time  1    Period  Months    Status  Achieved      PT SHORT TERM GOAL #4   Title  Patient ambulates 300' with cane & prosthesis with supervision.     Baseline  MET 10/16/2018    Time  1    Period  Months    Status  Achieved      PT SHORT TERM GOAL #5   Title  Patient negotiates ramps & curbs with cane and stairs 1 rail/cane with supervision.     Baseline  MET 10/16/2018    Time  1    Period  Months    Status  Achieved      PT SHORT TERM GOAL #6   Title  Patient ambulates 100' with prosthesis only negotiating furniture with [place].     Baseline  MET 10/16/2018    Time  1    Period  Months  Status  Achieved        PT Long Term Goals - 10/16/18 2127      PT LONG TERM GOAL #1   Title  Patient demonstrates & verbalizes understanding of prosthetic care to enable safe use. (All LTGs Target Date: 11/14/2018)    Baseline  MET 10/16/2018    Time  12    Period  Weeks    Status  Achieved      PT LONG TERM GOAL #2   Title  Patient tolerates prosthesis wear >90% of awake hours without skin or limb pain issues to enable function throughout his day.     Baseline  MET 10/16/2018    Time  12    Period  Weeks     Status  Achieved      PT LONG TERM GOAL #3   Title  Berg Balance >36/56 to indicate lower fall risk.     Baseline  MET 10/16/2018 Merrilee Jansky Balance 37/56    Time  12    Period  Weeks    Status  Achieved      PT LONG TERM GOAL #4   Title  Patient ambulates 300' outdoors including grass with cane or less and prosthesis modified independent for community mobility.     Baseline  MET 10/16/2018 with cane    Time  12    Period  Weeks    Status  Achieved      PT LONG TERM GOAL #5   Title  Patient negotiates ramps, curbs & stairs single rail with cane or less & prosthesis modified independent for community access.     Baseline  MET 10/16/2018 with cane    Time  12    Period  Weeks    Status  Achieved      PT LONG TERM GOAL #6   Title  Patient ambulates with prosthesis only around furniture carrying household items modified independent for household mobility.     Baseline  MET 10/16/2018    Time  12    Period  Weeks    Status  Achieved            Plan - 10/16/18 2128    Clinical Impression Statement  Patient met all LTGs set for prosthetic PT plan of care. He improved his balance with Berg from 16/56 to 37/56. Patient appears safe ambulating with prosthesis only in house and cane in community for limited distances. Pt may benefit from additional PT for balance & gait. If he chooses to seek more PT, he appears able to see more local PT to his home who may not be specialist in prosthetic care.     Rehab Potential  Good    PT Frequency  2x / week    PT Duration  12 weeks    PT Treatment/Interventions  ADLs/Self Care Home Management;Canalith Repostioning;DME Instruction;Gait training;Stair training;Functional mobility training;Therapeutic activities;Therapeutic exercise;Balance training;Neuromuscular re-education;Patient/family education;Prosthetic Training;Dry needling;Vestibular    PT Next Visit Plan  discharge PT    Consulted and Agree with Plan of Care  Patient;Family member/caregiver     Family Member Consulted  wife       Patient will benefit from skilled therapeutic intervention in order to improve the following deficits and impairments:  Abnormal gait, Decreased activity tolerance, Decreased balance, Decreased endurance, Decreased knowledge of use of DME, Decreased mobility, Decreased range of motion, Decreased scar mobility, Decreased strength, Dizziness, Increased edema, Impaired flexibility, Postural dysfunction, Prosthetic Dependency  Visit Diagnosis: Other abnormalities of gait  and mobility  Unsteadiness on feet  Muscle weakness (generalized)  Abnormal posture  Stiffness of right knee, not elsewhere classified     Problem List Patient Active Problem List   Diagnosis Date Noted  . Non-healing wound of lower extremity 03/18/2018  . Nonhealing surgical wound 03/12/2018  . PAD (peripheral artery disease) ([place]) 03/03/2018  . PVD (peripheral vascular disease) ([place]) 03/03/2018  . Iron deficiency anemia   . Atrophic gastritis without hemorrhage   . OA (osteoarthritis) of hip 05/01/2017  . OA (osteoarthritis) of knee 12/13/2014  . Other pancytopenia ([place]) 02/04/2013  . Fever 02/02/2013  . Neutropenia ([place]) 02/02/2013  . Weakness 02/01/2013  . Rigors 02/01/2013  . Headache(784.0) 02/01/2013  . Lupus ([place]) 02/01/2013  . Thrombocytopenia ([place]) 02/01/2013  . DVT (deep venous thrombosis) ([place])   . HYPERTENSION, UNSPECIFIED 06/22/2009  . CHEST PAIN-UNSPECIFIED 06/22/2009    Jamey Reas PT, DPT 10/16/2018, 9:38 PM  [facility] [address] [place], [place], [address] Phone: [phone]   Fax:  [phone]  Name: JAIDEV SANGER MRN: [id number] Date of Birth: [birth year]

## 2018-10-28 DIAGNOSIS — M79676 Pain in unspecified toe(s): Secondary | ICD-10-CM | POA: Diagnosis not present

## 2018-10-28 DIAGNOSIS — I70203 Unspecified atherosclerosis of native arteries of extremities, bilateral legs: Secondary | ICD-10-CM | POA: Diagnosis not present

## 2018-10-28 DIAGNOSIS — L84 Corns and callosities: Secondary | ICD-10-CM | POA: Diagnosis not present

## 2018-10-28 DIAGNOSIS — B351 Tinea unguium: Secondary | ICD-10-CM | POA: Diagnosis not present

## 2018-11-09 DIAGNOSIS — S81809A Unspecified open wound, unspecified lower leg, initial encounter: Secondary | ICD-10-CM | POA: Diagnosis not present

## 2018-11-11 ENCOUNTER — Ambulatory Visit: Payer: Medicare HMO | Admitting: Urology

## 2018-11-11 DIAGNOSIS — N401 Enlarged prostate with lower urinary tract symptoms: Secondary | ICD-10-CM

## 2018-11-11 DIAGNOSIS — R3914 Feeling of incomplete bladder emptying: Secondary | ICD-10-CM | POA: Diagnosis not present

## 2018-11-17 DIAGNOSIS — R5383 Other fatigue: Secondary | ICD-10-CM | POA: Diagnosis not present

## 2018-11-17 DIAGNOSIS — I82499 Acute embolism and thrombosis of other specified deep vein of unspecified lower extremity: Secondary | ICD-10-CM | POA: Diagnosis not present

## 2018-11-17 DIAGNOSIS — R531 Weakness: Secondary | ICD-10-CM | POA: Diagnosis not present

## 2018-11-17 DIAGNOSIS — Z6831 Body mass index (BMI) 31.0-31.9, adult: Secondary | ICD-10-CM | POA: Diagnosis not present

## 2018-11-18 ENCOUNTER — Telehealth: Payer: Self-pay | Admitting: Gastroenterology

## 2018-11-18 ENCOUNTER — Encounter: Payer: Self-pay | Admitting: Internal Medicine

## 2018-11-18 NOTE — Telephone Encounter (Signed)
SCHEDULED AND LETTER SENT  °

## 2018-11-18 NOTE — Telephone Encounter (Signed)
Patient needs routine office visit due to IDA. Last seen March 2019.

## 2018-11-22 IMAGING — DX DG CHEST 1V PORT
1 series · 1 of 1 positions shown · non-contrast
Comparison: 10/19/2015

CLINICAL DATA: Fever.

EXAM:
PORTABLE CHEST 1 VIEW

[chest ap]
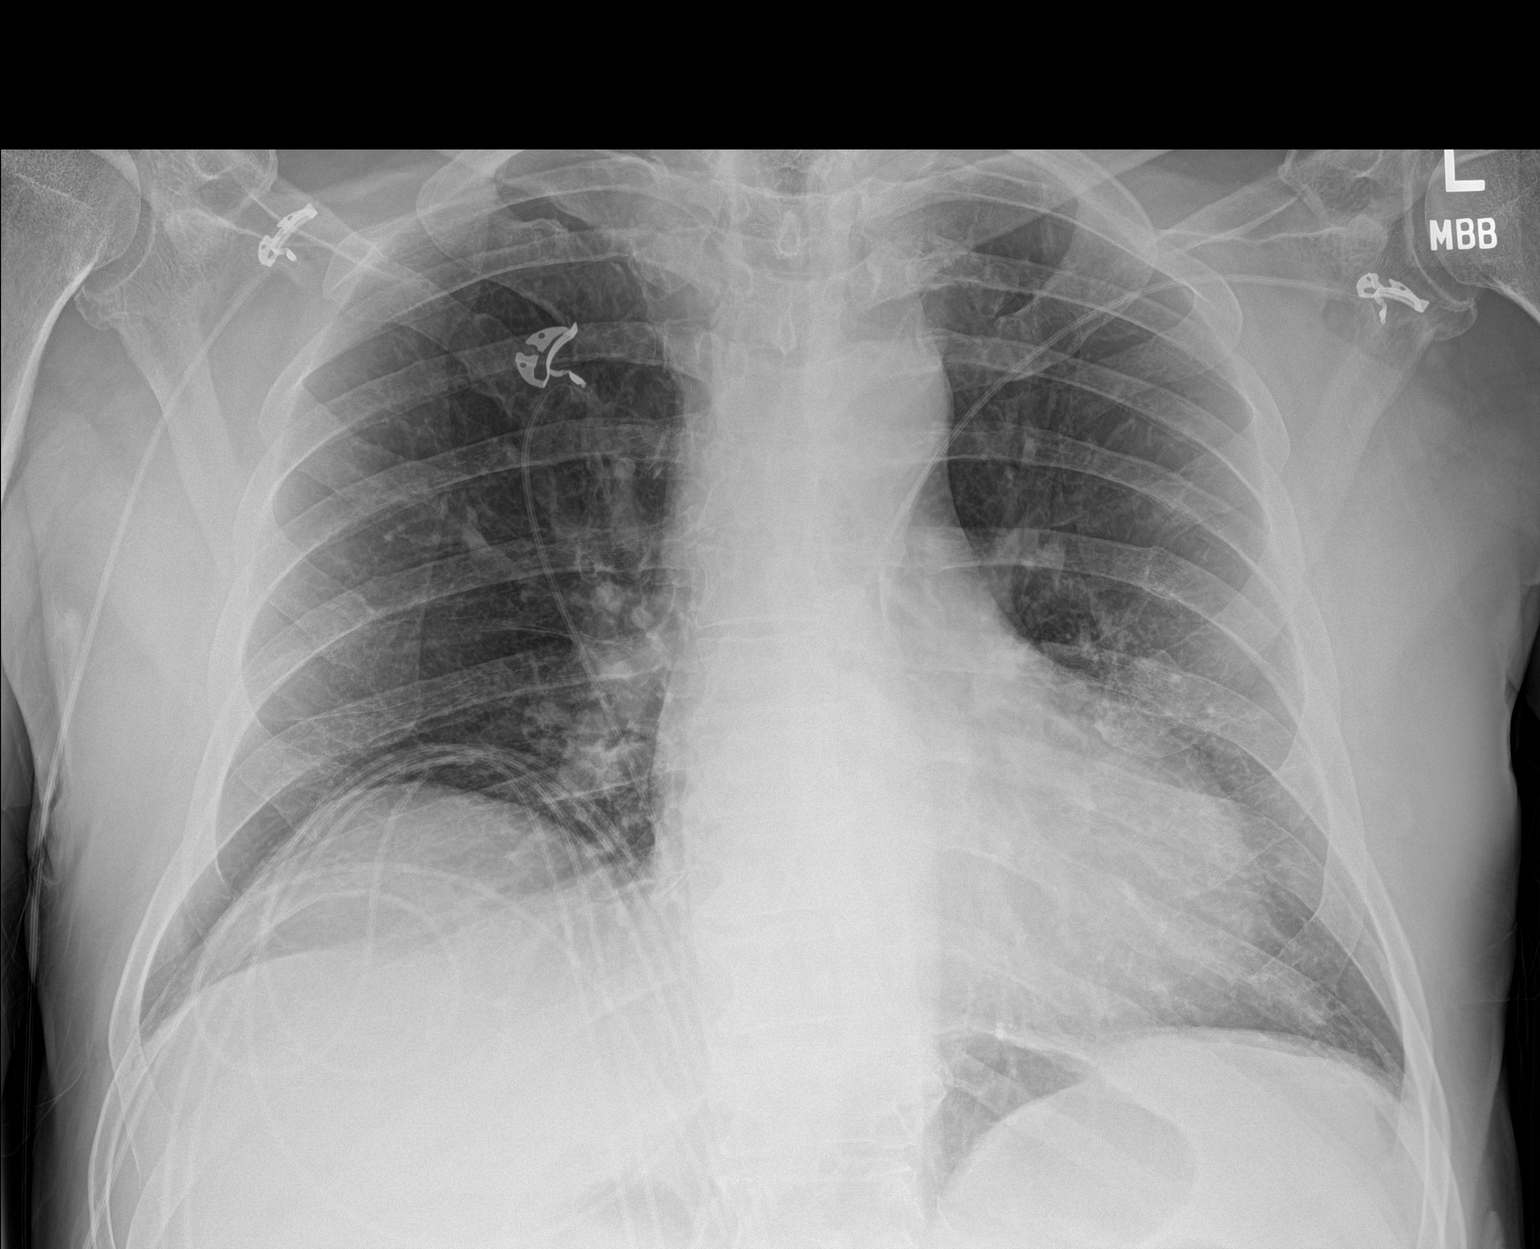

[1 of 1 positions shown; findings below may reference images not displayed]

FINDINGS: The cardiomediastinal silhouette is unchanged and within normal
limits. Anterior eventration of the right hemidiaphragm is again
noted. No confluent airspace opacity, edema, sizable pleural
effusion, or pneumothorax is identified.
IMPRESSION: No active disease.

## 2018-12-08 DIAGNOSIS — S81809A Unspecified open wound, unspecified lower leg, initial encounter: Secondary | ICD-10-CM | POA: Diagnosis not present

## 2018-12-15 DIAGNOSIS — D6861 Antiphospholipid syndrome: Secondary | ICD-10-CM | POA: Diagnosis not present

## 2018-12-15 DIAGNOSIS — E669 Obesity, unspecified: Secondary | ICD-10-CM | POA: Diagnosis not present

## 2018-12-15 DIAGNOSIS — R29898 Other symptoms and signs involving the musculoskeletal system: Secondary | ICD-10-CM | POA: Diagnosis not present

## 2018-12-15 DIAGNOSIS — M150 Primary generalized (osteo)arthritis: Secondary | ICD-10-CM | POA: Diagnosis not present

## 2018-12-15 DIAGNOSIS — M2550 Pain in unspecified joint: Secondary | ICD-10-CM | POA: Diagnosis not present

## 2018-12-15 DIAGNOSIS — M329 Systemic lupus erythematosus, unspecified: Secondary | ICD-10-CM | POA: Diagnosis not present

## 2018-12-15 DIAGNOSIS — Z6832 Body mass index (BMI) 32.0-32.9, adult: Secondary | ICD-10-CM | POA: Diagnosis not present

## 2018-12-15 DIAGNOSIS — Z79899 Other long term (current) drug therapy: Secondary | ICD-10-CM | POA: Diagnosis not present

## 2018-12-15 DIAGNOSIS — R5382 Chronic fatigue, unspecified: Secondary | ICD-10-CM | POA: Diagnosis not present

## 2018-12-16 DIAGNOSIS — I82503 Chronic embolism and thrombosis of unspecified deep veins of lower extremity, bilateral: Secondary | ICD-10-CM | POA: Diagnosis not present

## 2018-12-25 ENCOUNTER — Ambulatory Visit: Payer: Medicare HMO | Admitting: Vascular Surgery

## 2018-12-25 ENCOUNTER — Encounter (HOSPITAL_COMMUNITY): Payer: Medicare HMO

## 2019-01-08 DIAGNOSIS — S81809A Unspecified open wound, unspecified lower leg, initial encounter: Secondary | ICD-10-CM | POA: Diagnosis not present

## 2019-01-19 ENCOUNTER — Other Ambulatory Visit: Payer: Self-pay | Admitting: *Deleted

## 2019-01-19 ENCOUNTER — Telehealth: Payer: Self-pay | Admitting: *Deleted

## 2019-01-19 DIAGNOSIS — I739 Peripheral vascular disease, unspecified: Secondary | ICD-10-CM

## 2019-01-19 NOTE — Telephone Encounter (Signed)
Call from patient c/o increasing pain and numbness in toes on left foot. Very concerned about blood flow and missing March appointment due to Covid-19. Patient denies any swelling or temperature change, but states foot is cool anyway. Agreeable to see NP after vascular study this or next week. Appts. To call to schedule with patient. Instructed to report to [facility] ER for any acute changes or worsening condition.

## 2019-01-22 ENCOUNTER — Encounter: Payer: Self-pay | Admitting: Family

## 2019-01-22 ENCOUNTER — Ambulatory Visit (INDEPENDENT_AMBULATORY_CARE_PROVIDER_SITE_OTHER): Payer: Medicare HMO | Admitting: Family

## 2019-01-22 ENCOUNTER — Other Ambulatory Visit: Payer: Self-pay

## 2019-01-22 ENCOUNTER — Ambulatory Visit (HOSPITAL_COMMUNITY)
Admission: RE | Admit: 2019-01-22 | Discharge: 2019-01-22 | Disposition: A | Discharge status: Home / Self Care | Payer: Medicare HMO | Source: Ambulatory Visit | Attending: Vascular Surgery | Admitting: Vascular Surgery

## 2019-01-22 VITALS — BP 158/88 | HR 69 | Temp 97.3°F | Resp 16 | Ht 68.0 in | Wt 212.5 lb

## 2019-01-22 DIAGNOSIS — I739 Peripheral vascular disease, unspecified: Secondary | ICD-10-CM | POA: Insufficient documentation

## 2019-01-22 DIAGNOSIS — M329 Systemic lupus erythematosus, unspecified: Secondary | ICD-10-CM | POA: Diagnosis not present

## 2019-01-22 DIAGNOSIS — I779 Disorder of arteries and arterioles, unspecified: Secondary | ICD-10-CM | POA: Diagnosis not present

## 2019-01-22 DIAGNOSIS — Z89511 Acquired absence of right leg below knee: Secondary | ICD-10-CM | POA: Diagnosis not present

## 2019-01-22 DIAGNOSIS — Z8673 Personal history of transient ischemic attack (TIA), and cerebral infarction without residual deficits: Secondary | ICD-10-CM

## 2019-01-22 DIAGNOSIS — R0989 Other specified symptoms and signs involving the circulatory and respiratory systems: Secondary | ICD-10-CM | POA: Diagnosis not present

## 2019-01-22 NOTE — Progress Notes (Signed)
[facility] [place]   CC: Follow up peripheral artery occlusive disease  History of Present Illness Jose Bush is a 78 y.o. male who is s/p right below-knee amputation June 2019 by Dr. Darrick Penna.  This had been slow to heal.    Dr. Darrick Penna last evaluated pt on 06-26-18. At that time right below-knee amputation still had some thinned out skin on the lateral aspect but otherwise well-healed. Patient was to be fitted for a shrinker and evaluation for prosthetic. Dr. Darrick Penna advised follow-up in 6 months time with ABI on the left leg to make sure he does not have any new problems on the left side.  Pt has a history of chronic immunosuppression and is treated for lupus.  He takes Plaquenil and resumed prednisone. He denies prior claudication symptoms.  He is on chronic Coumadin for thrombophilia secondary to his lupus.  He did have a DVT in his left leg about 2009.  Other medical problems include arthritis, reflux, hypertension, prior stroke affecting his left side.   Pt reports tingling and pain in his entire left foot that seems to wax and wane, pt states this is overall worsening. He is seeing a podiatrist on a regular basis. He sees [facility] for his prosthesis. He reports occasional pressure feeling and loose feeling in his prosthesis.  He denies taking any antibiotics currently.  He states that after about 600-800 steps, his left thigh starts to feel tired.   He sees Rheumatology for his Lupus, states he is back on prednisone.   He states his left upper extremity has felt weaker and he is less able to use it in the last two months. He states that he has an appointment with his neurologist in a few days; I advised pt to discuss this with his neurologist. No neurology encounters on file other than in the ED on 02-01-13. No carotid duplex result on file, he has a right carotid bruit.  He is right hand dominant.   Diabetic: No Tobacco use: former smoker, quit in 1985, smoked 1/2  ppd x 40 years  Pt meds include: Statin :No Betablocker: No ASA: No Other anticoagulants/antiplatelets: Warfarin   Past Medical History:  Diagnosis Date  . Arthritis   . Chest pain, unspecified   . DVT (deep venous thrombosis) (HCC)   . GERD (gastroesophageal reflux disease)   . Heart murmur    hx of   . History of hiatal hernia   . HTN (hypertension)   . Lupus (HCC)   . Lupus (HCC)   . Peripheral vascular disease (HCC)    nonviable tissue   . Shingles    hx of   . Stroke [facility]) 2010   'MILD" per pt-  weakness left side  . Tinnitus   . Ulcer of abdomen wall (HCC)   . Wears dentures   . Wears glasses     Social History Social History   Tobacco Use  . Smoking status: Former Smoker    Packs/day: 0.50    Years: 40.00    Pack years: 20.00    Types: Cigarettes    Last attempt to quit: 10/02/1983    Years since quitting: 35.3  . Smokeless tobacco: Never Used  Substance Use Topics  . Alcohol use: No  . Drug use: No    Family History Family History  Problem Relation Age of Onset  . Hypertension Other   . Stroke Mother   . Colon cancer Neg Hx  Past Surgical History:  Procedure Laterality Date  . ABDOMINAL AORTOGRAM W/LOWER EXTREMITY N/A 02/17/2018   Procedure: ABDOMINAL AORTOGRAM W/LOWER EXTREMITY;  Surgeon: Maeola Harman, MD;  Location[facility] INVASIVE CV LAB;  Service: Cardiovascular;  Laterality: N/A;  . AMPUTATION Right 03/03/2018   Procedure: AMPUTATION RIGHT SECOND TOE;  Surgeon: Sherren Kerns, MD;  Location: [facility] OR;  Service: Vascular;  Laterality: Right;  . AMPUTATION Right 03/18/2018   Procedure: AMPUTATION BELOW KNEE;  Surgeon: Sherren Kerns, MD;  Location: [facility] OR;  Service: Vascular;  Laterality: Right;  . BELOW KNEE LEG AMPUTATION Right 03/18/2018  . bilateral cataract surgery     . BIOPSY N/A 10/22/2017   Procedure: BIOPSY;  Surgeon: Franky Macho, MD;  Location: [facility] ENDO SUITE;  Service: Gastroenterology;  Laterality: N/A;  .  COLONOSCOPY N/A 09/18/2016   Dr. Lovell Sheehan: normal  . ESOPHAGOGASTRODUODENOSCOPY N/A 10/22/2017   Dr. Lovell Sheehan: normal duodenum, gastric mucosal atrophy but negative H.pylori (Clotest negative), normal GE junction  . GIVENS CAPSULE STUDY N/A 01/27/2018   Procedure: GIVENS CAPSULE STUDY;  Surgeon: Corbin Ade, MD;  Location: [facility] SUITE;  Service: Endoscopy;  Laterality: N/A;  7:00am  . HERNIA REPAIR     2010/laparoscopic/right  . ivc filter     01/2013   . KNEE ARTHROSCOPY Right    2007  . MULTIPLE TOOTH EXTRACTIONS    . PERIPHERAL VASCULAR BALLOON ANGIOPLASTY  02/17/2018   Procedure: PERIPHERAL VASCULAR BALLOON ANGIOPLASTY;  Surgeon: Maeola Harman, MD;  Location: [facility] INVASIVE CV LAB;  Service: Cardiovascular;;  . TOTAL HIP ARTHROPLASTY Right 05/01/2017   Procedure: RIGHT TOTAL HIP ARTHROPLASTY ANTERIOR APPROACH;  Surgeon: Ollen Gross, MD;  Location: [facility] ORS;  Service: Orthopedics;  Laterality: Right;  . TOTAL KNEE ARTHROPLASTY Left 12/13/2014   Procedure: LEFT TOTAL KNEE ARTHROPLASTY;  Surgeon: Ollen Gross, MD;  Location: [facility] ORS;  Service: Orthopedics;  Laterality: Left;  . TOTAL KNEE ARTHROPLASTY Right 03/21/2015   Procedure: RIGHT TOTAL KNEE ARTHROPLASTY;  Surgeon: Ollen Gross, MD;  Location: [facility] ORS;  Service: Orthopedics;  Laterality: Right;    No Known Allergies  Current Outpatient Medications  Medication Sig Dispense Refill  . acetaminophen (TYLENOL) 500 MG tablet Take 500 mg by mouth every 6 (six) hours as needed for moderate pain or headache.     Marland Kitchen Bioflavonoid Products (ESTER C PO) Take 500 mg by mouth daily.    . cephALEXin (KEFLEX) 500 MG capsule Take 1 capsule (500 mg total) by mouth 3 (three) times daily. 40 capsule 0  . docusate sodium (COLACE) 100 MG capsule Take 100 mg by mouth daily.     . feeding supplement, ENSURE ENLIVE, (ENSURE ENLIVE) LIQD Take 237 mLs by mouth 2 (two) times daily between meals. 237 mL 12  . hydroxychloroquine (PLAQUENIL) 200 MG  tablet Take 200 mg by mouth 2 (two) times daily.    Marland Kitchen lactose free nutrition (BOOST PLUS) LIQD Take 237 mLs by mouth 3 (three) times daily with meals.  0  . lisinopril-hydrochlorothiazide (PRINZIDE,ZESTORETIC) 10-12.5 MG per tablet Take 1 tablet by mouth daily with breakfast.     . Menthol, Topical Analgesic, (ICY HOT EX) Apply 1 application topically 2 (two) times daily as needed (left knee.).     Marland Kitchen Misc Natural Products (URINOZINC PO) Take 1 capsule by mouth 2 (two) times daily.     . Multiple Vitamin (MULTIVITAMIN WITH MINERALS) TABS tablet Take 1 tablet by mouth daily. Centrum Silver    . OMEGA-3 KRILL OIL PO Take  1 capsule by mouth daily.    Marland Kitchen omeprazole (PRILOSEC) 20 MG capsule Take 20 mg by mouth daily.    . tamsulosin (FLOMAX) 0.4 MG CAPS capsule Take 2 capsules (0.8 mg total) by mouth daily. 60 capsule 0  . warfarin (COUMADIN) 5 MG tablet Take 1 tablet (5 mg total) by mouth every morning. Take Coumadin for three weeks for postoperative protocol and then the patient may resume their previous Coumadin home regimen.  The dose may need to be adjusted based upon the INR.  Please follow the INR and titrate Coumadin dose for a therapeutic range between 2.0 and 3.0 INR.  After completing the three weeks of Coumadin, the patient may resume their previous Coumadin home regimen. (Patient taking differently: Take 5 mg by mouth daily. ) 35 tablet 0   No current facility-administered medications for this visit.     ROS: See HPI for pertinent positives and negatives.   Physical Examination  Vitals:   01/22/19 0911 01/22/19 0917  BP: (!) 171/92 (!) 158/88  Pulse: 69   Resp: 16   Temp: (!) 97.3 F (36.3 C)   TempSrc: Oral   SpO2: 95%   Weight: 212 lb 8 oz (96.4 kg)   Height:  (1.727 m)    Body mass index is 32.31 kg/m.  General: A&O x 3, WDWN, male. Gait: mild limp, using cane HENT: No gross abnormalities.  Eyes: PERRLA. Pulmonary: Respirations are non labored, CTAB, good air  movement in all fields Cardiac: regular rhythm and rate, + low grade murmur.         Carotid Bruits Right Left   Positive Negative   Radial pulses are 2+ palpable bilaterally   Adominal aortic pulse is not palpable                         VASCULAR EXAM: Extremities without ischemic changes, without Gangrene; without open wounds. Mild enous statsis changes in left lower leg, no edema.    Right BKA stump   Right BKA stump                                                                                                           LE Pulses Right Left       FEMORAL  2+ palpable  2+ palpable        POPLITEAL  not palpable   2+ palpable       POSTERIOR TIBIAL  BKA   not palpable        DORSALIS PEDIS      ANTERIOR TIBIAL BKA  not palpable    Abdomen: soft, NT, no palpable masses. Skin: no rashes, no cellulitis, no ulcers noted. Musculoskeletal: no muscle wasting or atrophy.  Neurologic: A&O X 3; appropriate affect, Sensation is normal; MOTOR FUNCTION:  moving all extremities equally, motor strength 5/5 throughout. Speech is fluent/normal. CN 2-12 intact. Psychiatric: Thought content is normal, mood appropriate for clinical situation.     ASSESSMENT: ONDRA DEBOARD is a 78 y.o. male who  is s/p right below-knee amputation June 2019 by Dr. Darrick Penna.   Last serum creatinine result on file was 0.65 on 04-09-18.   Dr. Darrick Penna spoke with and examined pt and he viewed the aortogram images from 2019. See Plan.     DATA  ABI (Date: 01/22/2019): ABI Findings: +--------+------------------+-----+--------+--------+ Right   Rt Pressure (mmHg)IndexWaveformComment  +--------+------------------+-----+--------+--------+ [id number]                                     +--------+------------------+-----+--------+--------+  +---------+------------------+-----+--------+-------+ Left     Lt Pressure (mmHg)IndexWaveformComment  +---------+------------------+-----+--------+-------+ Brachial 169                                    +---------+------------------+-----+--------+-------+ PTA                             biphasic        +---------+------------------+-----+--------+-------+ DP                              biphasic        +---------+------------------+-----+--------+-------+ Great Toe45                0.27                 +---------+------------------+-----+--------+-------+  +-------+-----------+-----------+------------+------------+ ABI/TBIToday's ABIToday's TBIPrevious ABIPrevious TBI +-------+-----------+-----------+------------+------------+ Right  BKA        BKA                                 +-------+-----------+-----------+------------+------------+ Left   non-comp   0.27                                +-------+-----------+-----------+------------+------------+  Summary: Right: BKA. Left: Resting left ankle-brachial index indicates noncompressible left lower extremity arteries. The left toe-brachial index is abnormal  Previous (02-12-18 at [facility]) Left: monophasic PT and DP, 1.17 ABI (falsely elevated). No TBI recorded.     PLAN:  Based on the patient's vascular studies and examination, pt will return to clinic in 6 months with left ABI and carotid duplex (right carotid bruit, hx of stroke with left side weakness). No carotid duplex result on file.  I advised him to notify us if he develops pain in his left LE that wakes him at night, or a sore that does not heal.   Daily seated leg exercises discussed and demonstrated. He will be able to walk more securely once his right BKA prosthesis fits better, it seems loose to him since his stump shrank.    Pt neurologist is not listed in his Care Team, he cannot remember the name of his neurologist. I advised pt to call us back and give the name of his neurologist so it may be entered into his  Care Team.   Referral to [employer] to reform prosthesis fitting as his right BKA stump seems to have shrunk; pt states he already has an appointment for May 2020; will give referral in case he needs it.   I discussed in depth with the patient the nature of atherosclerosis, and emphasized the importance of maximal medical management including strict control of  blood pressure, blood glucose, and lipid levels, obtaining regular exercise, and continued cessation of smoking.  The patient is aware that without maximal medical management the underlying atherosclerotic disease process will progress, limiting the benefit of any interventions.  The patient was given information about PAD including signs, symptoms, treatment, what symptoms should prompt the patient to seek immediate medical care, and risk reduction measures to take.  Charisse MarchSuzanne Krysta Bloomfield, RN, MSN, FNP-C [facility] [place] Office Phone: [phone]  [facility]: [facility]  01/22/19 9:24 AM

## 2019-01-22 NOTE — Patient Instructions (Signed)
Peripheral Vascular Disease  Peripheral vascular disease (PVD) is a disease of the blood vessels that are not part of your heart and brain. A simple term for PVD is poor circulation. In most cases, PVD narrows the blood vessels that carry blood from your heart to the rest of your body. This can reduce the supply of blood to your arms, legs, and internal organs, like your stomach or kidneys. However, PVD most often affects a person's lower legs and feet. Without treatment, PVD tends to get worse. PVD can also lead to acute ischemic limb. This is when an arm or leg suddenly cannot get enough blood. This is a medical emergency. Follow these instructions at home: Lifestyle  Do not use any products that contain nicotine or tobacco, such as cigarettes and e-cigarettes. If you need help quitting, ask your doctor.  Lose weight if you are overweight. Or, stay at a healthy weight as told by your doctor.  Eat a diet that is low in fat and cholesterol. If you need help, ask your doctor.  Exercise regularly. Ask your doctor for activities that are right for you. General instructions  Take over-the-counter and prescription medicines only as told by your doctor.  Take good care of your feet: ? Wear comfortable shoes that fit well. ? Check your feet often for any cuts or sores.  Keep all follow-up visits as told by your doctor This is important. Contact a doctor if:  You have cramps in your legs when you walk.  You have leg pain when you are at rest.  You have coldness in a leg or foot.  Your skin changes.  You are unable to get or have an erection (erectile dysfunction).  You have cuts or sores on your feet that do not heal. Get help right away if:  Your arm or leg turns cold, numb, and blue.  Your arms or legs become red, warm, swollen, painful, or numb.  You have chest pain.  You have trouble breathing.  You suddenly have weakness in your face, arm, or leg.  You become very  confused or you cannot speak.  You suddenly have a very bad headache.  You suddenly cannot see. Summary  Peripheral vascular disease (PVD) is a disease of the blood vessels.  A simple term for PVD is poor circulation. Without treatment, PVD tends to get worse.  Treatment may include exercise, low fat and low cholesterol diet, and quitting smoking. This information is not intended to replace advice given to you by your health care provider. Make sure you discuss any questions you have with your health care provider. Document Released: 12/12/2009 Document Revised: 10/25/2016 Document Reviewed: 10/25/2016 Elsevier Interactive Patient Education  2019 [employer]  

## 2019-01-27 DIAGNOSIS — I82503 Chronic embolism and thrombosis of unspecified deep veins of lower extremity, bilateral: Secondary | ICD-10-CM | POA: Diagnosis not present

## 2019-01-27 DIAGNOSIS — I82499 Acute embolism and thrombosis of other specified deep vein of unspecified lower extremity: Secondary | ICD-10-CM | POA: Diagnosis not present

## 2019-01-28 ENCOUNTER — Ambulatory Visit: Payer: Medicare HMO | Admitting: Gastroenterology

## 2019-02-05 DIAGNOSIS — Z0189 Encounter for other specified special examinations: Secondary | ICD-10-CM | POA: Diagnosis not present

## 2019-02-05 DIAGNOSIS — M79675 Pain in left toe(s): Secondary | ICD-10-CM | POA: Diagnosis not present

## 2019-02-05 DIAGNOSIS — L03032 Cellulitis of left toe: Secondary | ICD-10-CM | POA: Diagnosis not present

## 2019-02-07 DIAGNOSIS — S81809A Unspecified open wound, unspecified lower leg, initial encounter: Secondary | ICD-10-CM | POA: Diagnosis not present

## 2019-02-12 DIAGNOSIS — L03032 Cellulitis of left toe: Secondary | ICD-10-CM | POA: Diagnosis not present

## 2019-02-12 DIAGNOSIS — I82503 Chronic embolism and thrombosis of unspecified deep veins of lower extremity, bilateral: Secondary | ICD-10-CM | POA: Diagnosis not present

## 2019-02-20 ENCOUNTER — Telehealth: Payer: Self-pay

## 2019-02-20 NOTE — Telephone Encounter (Signed)
Pts wife called and said that [employer] was trying to get more information from [place] regarding his new leg since the other was not fitting. She said that they had told her that they faxed over a form for [place] to fill out and return and they would be able to fit him.   Spoke with [employer] and she had not received anything from them - called and left message advising wife that we had not received anything and told her that if they would resend it that we would have it filled out and sent back asap so he could be fitted for his new prosthesis.   Fax number given   Julious Payer, CMA

## 2019-03-10 DIAGNOSIS — S81809A Unspecified open wound, unspecified lower leg, initial encounter: Secondary | ICD-10-CM | POA: Diagnosis not present

## 2019-03-16 DIAGNOSIS — I82503 Chronic embolism and thrombosis of unspecified deep veins of lower extremity, bilateral: Secondary | ICD-10-CM | POA: Diagnosis not present

## 2019-03-31 DIAGNOSIS — R5382 Chronic fatigue, unspecified: Secondary | ICD-10-CM | POA: Diagnosis not present

## 2019-03-31 DIAGNOSIS — E669 Obesity, unspecified: Secondary | ICD-10-CM | POA: Diagnosis not present

## 2019-03-31 DIAGNOSIS — M150 Primary generalized (osteo)arthritis: Secondary | ICD-10-CM | POA: Diagnosis not present

## 2019-03-31 DIAGNOSIS — Z79899 Other long term (current) drug therapy: Secondary | ICD-10-CM | POA: Diagnosis not present

## 2019-03-31 DIAGNOSIS — M2550 Pain in unspecified joint: Secondary | ICD-10-CM | POA: Diagnosis not present

## 2019-03-31 DIAGNOSIS — R29898 Other symptoms and signs involving the musculoskeletal system: Secondary | ICD-10-CM | POA: Diagnosis not present

## 2019-03-31 DIAGNOSIS — D6861 Antiphospholipid syndrome: Secondary | ICD-10-CM | POA: Diagnosis not present

## 2019-03-31 DIAGNOSIS — M329 Systemic lupus erythematosus, unspecified: Secondary | ICD-10-CM | POA: Diagnosis not present

## 2019-03-31 DIAGNOSIS — Z6830 Body mass index (BMI) 30.0-30.9, adult: Secondary | ICD-10-CM | POA: Diagnosis not present

## 2019-04-07 ENCOUNTER — Ambulatory Visit: Payer: Medicare HMO | Admitting: Urology

## 2019-04-07 DIAGNOSIS — Z89511 Acquired absence of right leg below knee: Secondary | ICD-10-CM | POA: Diagnosis not present

## 2019-04-09 DIAGNOSIS — I82499 Acute embolism and thrombosis of other specified deep vein of unspecified lower extremity: Secondary | ICD-10-CM | POA: Diagnosis not present

## 2019-04-09 DIAGNOSIS — S81809A Unspecified open wound, unspecified lower leg, initial encounter: Secondary | ICD-10-CM | POA: Diagnosis not present

## 2019-05-01 DIAGNOSIS — I6309 Cerebral infarction due to thrombosis of other precerebral artery: Secondary | ICD-10-CM | POA: Diagnosis not present

## 2019-05-01 DIAGNOSIS — I10 Essential (primary) hypertension: Secondary | ICD-10-CM | POA: Diagnosis not present

## 2019-05-05 DIAGNOSIS — R531 Weakness: Secondary | ICD-10-CM | POA: Diagnosis not present

## 2019-05-05 DIAGNOSIS — Z6831 Body mass index (BMI) 31.0-31.9, adult: Secondary | ICD-10-CM | POA: Diagnosis not present

## 2019-05-05 DIAGNOSIS — L570 Actinic keratosis: Secondary | ICD-10-CM | POA: Diagnosis not present

## 2019-05-05 DIAGNOSIS — I82499 Acute embolism and thrombosis of other specified deep vein of unspecified lower extremity: Secondary | ICD-10-CM | POA: Diagnosis not present

## 2019-05-05 DIAGNOSIS — M25512 Pain in left shoulder: Secondary | ICD-10-CM | POA: Diagnosis not present

## 2019-05-05 DIAGNOSIS — R5383 Other fatigue: Secondary | ICD-10-CM | POA: Diagnosis not present

## 2019-05-13 DIAGNOSIS — M25512 Pain in left shoulder: Secondary | ICD-10-CM | POA: Diagnosis not present

## 2019-05-19 ENCOUNTER — Ambulatory Visit (INDEPENDENT_AMBULATORY_CARE_PROVIDER_SITE_OTHER): Payer: Medicare HMO | Admitting: Urology

## 2019-05-19 DIAGNOSIS — R3914 Feeling of incomplete bladder emptying: Secondary | ICD-10-CM | POA: Diagnosis not present

## 2019-05-19 DIAGNOSIS — N401 Enlarged prostate with lower urinary tract symptoms: Secondary | ICD-10-CM

## 2019-06-16 DIAGNOSIS — I82503 Chronic embolism and thrombosis of unspecified deep veins of lower extremity, bilateral: Secondary | ICD-10-CM | POA: Diagnosis not present

## 2019-06-23 DIAGNOSIS — I82499 Acute embolism and thrombosis of other specified deep vein of unspecified lower extremity: Secondary | ICD-10-CM | POA: Diagnosis not present

## 2019-06-29 DIAGNOSIS — R5383 Other fatigue: Secondary | ICD-10-CM | POA: Diagnosis not present

## 2019-06-29 DIAGNOSIS — D509 Iron deficiency anemia, unspecified: Secondary | ICD-10-CM | POA: Diagnosis not present

## 2019-06-29 DIAGNOSIS — K219 Gastro-esophageal reflux disease without esophagitis: Secondary | ICD-10-CM | POA: Diagnosis not present

## 2019-06-29 DIAGNOSIS — N400 Enlarged prostate without lower urinary tract symptoms: Secondary | ICD-10-CM | POA: Diagnosis not present

## 2019-06-29 DIAGNOSIS — I10 Essential (primary) hypertension: Secondary | ICD-10-CM | POA: Diagnosis not present

## 2019-06-29 DIAGNOSIS — I82503 Chronic embolism and thrombosis of unspecified deep veins of lower extremity, bilateral: Secondary | ICD-10-CM | POA: Diagnosis not present

## 2019-07-01 DIAGNOSIS — I10 Essential (primary) hypertension: Secondary | ICD-10-CM | POA: Diagnosis not present

## 2019-07-01 DIAGNOSIS — K219 Gastro-esophageal reflux disease without esophagitis: Secondary | ICD-10-CM | POA: Diagnosis not present

## 2019-07-02 DIAGNOSIS — I639 Cerebral infarction, unspecified: Secondary | ICD-10-CM | POA: Diagnosis not present

## 2019-07-02 DIAGNOSIS — Z6831 Body mass index (BMI) 31.0-31.9, adult: Secondary | ICD-10-CM | POA: Diagnosis not present

## 2019-07-02 DIAGNOSIS — Z23 Encounter for immunization: Secondary | ICD-10-CM | POA: Diagnosis not present

## 2019-07-02 DIAGNOSIS — N400 Enlarged prostate without lower urinary tract symptoms: Secondary | ICD-10-CM | POA: Diagnosis not present

## 2019-07-02 DIAGNOSIS — Z89511 Acquired absence of right leg below knee: Secondary | ICD-10-CM | POA: Diagnosis not present

## 2019-07-02 DIAGNOSIS — I70229 Atherosclerosis of native arteries of extremities with rest pain, unspecified extremity: Secondary | ICD-10-CM | POA: Diagnosis not present

## 2019-07-02 DIAGNOSIS — I82503 Chronic embolism and thrombosis of unspecified deep veins of lower extremity, bilateral: Secondary | ICD-10-CM | POA: Diagnosis not present

## 2019-07-02 DIAGNOSIS — Z0001 Encounter for general adult medical examination with abnormal findings: Secondary | ICD-10-CM | POA: Diagnosis not present

## 2019-07-27 ENCOUNTER — Other Ambulatory Visit: Payer: Self-pay

## 2019-07-27 DIAGNOSIS — I779 Disorder of arteries and arterioles, unspecified: Secondary | ICD-10-CM

## 2019-07-29 ENCOUNTER — Other Ambulatory Visit: Payer: Self-pay

## 2019-07-29 DIAGNOSIS — R0989 Other specified symptoms and signs involving the circulatory and respiratory systems: Secondary | ICD-10-CM

## 2019-07-30 ENCOUNTER — Encounter: Payer: Self-pay | Admitting: Family

## 2019-07-30 ENCOUNTER — Ambulatory Visit (INDEPENDENT_AMBULATORY_CARE_PROVIDER_SITE_OTHER)
Admission: RE | Admit: 2019-07-30 | Discharge: 2019-07-30 | Disposition: A | Discharge status: Home / Self Care | Payer: Medicare HMO | Source: Ambulatory Visit | Attending: Family | Admitting: Family

## 2019-07-30 ENCOUNTER — Ambulatory Visit (HOSPITAL_COMMUNITY)
Admission: RE | Admit: 2019-07-30 | Discharge: 2019-07-30 | Disposition: A | Discharge status: Home / Self Care | Payer: Medicare HMO | Source: Ambulatory Visit | Attending: Family | Admitting: Family

## 2019-07-30 ENCOUNTER — Other Ambulatory Visit: Payer: Self-pay

## 2019-07-30 ENCOUNTER — Ambulatory Visit: Payer: Medicare HMO | Admitting: Family

## 2019-07-30 VITALS — BP 128/74 | HR 69 | Temp 97.3°F | Resp 20 | Ht 68.0 in | Wt 206.0 lb

## 2019-07-30 DIAGNOSIS — Z8673 Personal history of transient ischemic attack (TIA), and cerebral infarction without residual deficits: Secondary | ICD-10-CM

## 2019-07-30 DIAGNOSIS — Z89511 Acquired absence of right leg below knee: Secondary | ICD-10-CM

## 2019-07-30 DIAGNOSIS — R0989 Other specified symptoms and signs involving the circulatory and respiratory systems: Secondary | ICD-10-CM | POA: Insufficient documentation

## 2019-07-30 DIAGNOSIS — I779 Disorder of arteries and arterioles, unspecified: Secondary | ICD-10-CM | POA: Diagnosis not present

## 2019-07-30 NOTE — Progress Notes (Signed)
[facility]    CC: Follow up peripheral artery occlusive disease  History of Present Illness Jose Bush is a 78 y.o. male who is s/pright below-knee amputation June 2019 by Dr. Oneida Alar. This had been slow to heal.   Dr. Oneida Alar last evaluated pt on 06-26-18. At that time right below-knee amputation still had some thinned out skin on the lateral aspect but otherwise well-healed. Patient was to be fitted for a shrinker and evaluation for prosthetic. Dr. Oneida Alar advised follow-up in 6 months time with ABI on the left leg to make sure he does not have any new problems on the left side.  Pt has a history of chronic immunosuppression and is treated for lupus. He takes Plaquenil and resumed prednisone.He is on chronic Coumadin for thrombophilia secondary to his lupus. He did have a DVT in his left leg about 2009. Other medical problems includearthritis, reflux, hypertension, prior stroke affecting his left side.   Pt reports tingling and pain in his entire left foot that seems to wax and wane, pt states this is overall worsening. He is seeing a podiatrist on a regular basis. He sees [employer] for his prosthesis.  [employer] adjusted his right BKA prosthesis since his last visit and pt states it feels secure now.   He states that after about 600-800 steps, his left thigh starts to feel tired.   He sees Rheumatology for his Lupus, states he is back on prednisone.   He is right hand dominant.   Diabetic: No Tobacco use: former smoker, quit in 1985, smoked 1/2 ppd x 40 years  Pt meds include: Statin :No, states his cholesterol is good Betablocker: No ASA: No Other anticoagulants/antiplatelets: Warfarin    Past Medical History:  Diagnosis Date  . Arthritis   . Chest pain, unspecified   . DVT (deep venous thrombosis) ([place])   . GERD (gastroesophageal reflux disease)   . Heart murmur    hx of   . History of hiatal hernia   . HTN (hypertension)   .  Lupus ([place])   . Lupus ([place])   . Peripheral vascular disease (H[facility])    nonviable tissue   . Shingles    hx of   . Stroke [facility]) 2010   'MILD" per pt-  weakness left side  . Tinnitus   . Ulcer of abdomen wall ([place])   . Wears dentures   . Wears glasses     Social History Social History   Tobacco Use  . Smoking status: Former Smoker    Packs/day: 0.50    Years: 40.00    Pack years: 20.00    Types: Cigarettes    Quit date: 10/02/1983    Years since quitting: 35.8  . Smokeless tobacco: Never Used  Substance Use Topics  . Alcohol use: No  . Drug use: No    Family History Family History  Problem Relation Age of Onset  . Hypertension Other   . Stroke Mother   . Colon cancer Neg Hx     Past Surgical History:  Procedure Laterality Date  . ABDOMINAL AORTOGRAM W/LOWER EXTREMITY N/A 02/17/2018   Procedure: ABDOMINAL AORTOGRAM W/LOWER EXTREMITY;  Surgeon: [name], MD;  Location: [facility];  Service: Cardiovascular;  Laterality: N/A;  . AMPUTATION Right 03/03/2018   Procedure: AMPUTATION RIGHT SECOND TOE;  Surgeon: [name], MD;  Location: [facility] OR;  Service: Vascular;  Laterality: Right;  . AMPUTATION Right 03/18/2018   Procedure: AMPUTATION BELOW KNEE;  Surgeon: Sherren Kerns, MD;  Location: [facility] OR;  Service: Vascular;  Laterality: Right;  . BELOW KNEE LEG AMPUTATION Right 03/18/2018  . bilateral cataract surgery     . BIOPSY N/A 10/22/2017   Procedure: BIOPSY;  Surgeon: Franky Macho, MD;  Location: [facility];  Service: Gastroenterology;  Laterality: N/A;  . COLONOSCOPY N/A 09/18/2016   Dr. Lovell Sheehan: normal  . ESOPHAGOGASTRODUODENOSCOPY N/A 10/22/2017   Dr. Lovell Sheehan: normal duodenum, gastric mucosal atrophy but negative H.pylori (Clotest negative), normal GE junction  . GIVENS CAPSULE STUDY N/A 01/27/2018   Procedure: GIVENS CAPSULE STUDY;  Surgeon: Corbin Ade, MD;  Location: [facility];  Service: Endoscopy;  Laterality: N/A;  7:00am  .  HERNIA REPAIR     2010/laparoscopic/right  . ivc filter     01/2013   . KNEE ARTHROSCOPY Right    2007  . MULTIPLE TOOTH EXTRACTIONS    . PERIPHERAL VASCULAR BALLOON ANGIOPLASTY  02/17/2018   Procedure: PERIPHERAL VASCULAR BALLOON ANGIOPLASTY;  Surgeon: Maeola Harman, MD;  Location: [facility] INVASIVE [facility] LAB;  Service: Cardiovascular;;  . TOTAL HIP ARTHROPLASTY Right 05/01/2017   Procedure: RIGHT TOTAL HIP ARTHROPLASTY ANTERIOR APPROACH;  Surgeon: Ollen Gross, MD;  Location: [facility] ORS;  Service: Orthopedics;  Laterality: Right;  . TOTAL KNEE ARTHROPLASTY Left 12/13/2014   Procedure: LEFT TOTAL KNEE ARTHROPLASTY;  Surgeon: Ollen Gross, MD;  Location: [facility] OR[facility];  Service: Orthopedics;  Laterality: Left;  . TOTAL KNEE ARTHROPLASTY Right 03/21/2015   Procedure: RIGHT TOTAL KNEE ARTHROPLASTY;  Surgeon: Ollen Gross, MD;  Location: [facility] OR[facility];  Service: Orthopedics;  Laterality: Right;    No Known Allergies  Current Outpatient Medications  Medication Sig Dispense Refill  . acetaminophen (TYLENOL) 500 MG tablet Take 500 mg by mouth every 6 (six) hours as needed for moderate pain or headache.     Marland Kitchen Bioflavonoid Products (ESTER C PO) Take 500 mg by mouth daily.    Marland Kitchen docusate sodium (COLACE) 100 MG capsule Take 100 mg by mouth daily.     . feeding supplement, ENSURE ENLIVE, (ENSURE ENLIVE) LIQD Take 237 mLs by mouth 2 (two) times daily between meals. 237 mL 12  . finasteride (PROSCAR) 5 MG tablet     . hydroxychloroquine (PLAQUENIL) 200 MG tablet Take 200 mg by mouth 2 (two) times daily.    Marland Kitchen lactose free nutrition (BOOST PLUS) LIQD Take 237 mLs by mouth 3 (three) times daily with meals.  0  . lisinopril-hydrochlorothiazide (PRINZIDE,ZESTORETIC) 10-12.5 MG per tablet Take 1 tablet by mouth daily with breakfast.     . Menthol, Topical Analgesic, (ICY HOT EX) Apply 1 application topically 2 (two) times daily as needed (left knee.).     Marland Kitchen Misc Natural Products (URINOZINC PO) Take 1 capsule by mouth 2  (two) times daily.     . Multiple Vitamin (MULTIVITAMIN WITH MINERALS) TABS tablet Take 1 tablet by mouth daily. Centrum Silver    . OMEGA-3 KRILL OIL PO Take 1 capsule by mouth daily.    Marland Kitchen omeprazole (PRILOSEC) 20 MG capsule Take 20 mg by mouth daily.    . tamsulosin (FLOMAX) 0.4 MG CAPS capsule Take 2 capsules (0.8 mg total) by mouth daily. 60 capsule 0  . warfarin (COUMADIN) 5 MG tablet Take 1 tablet (5 mg total) by mouth every morning. Take Coumadin for three weeks for postoperative protocol and then the patient may resume their previous Coumadin home regimen.  The dose may need to be adjusted based upon the INR.  Please  follow the INR and titrate Coumadin dose for a therapeutic range between 2.0 and 3.0 INR.  After completing the three weeks of Coumadin, the patient may resume their previous Coumadin home regimen. (Patient taking differently: Take 5 mg by mouth daily. ) 35 tablet 0   No current facility-administered medications for this visit.     ROS: See HPI for pertinent positives and negatives.   Physical Examination  Vitals:   07/30/19 1052 07/30/19 1055  BP: 129/77 128/74  Pulse: 69   Resp: 20   Temp: (!) 97.3 F (36.3 C)   SpO2: 96%   Weight: 206 lb (93.4 kg)   Height:  (1.727 m)    Body mass index is 31.32 kg/m.  General: A&O x 3, WDWN, obese male accompanied by his wife. Gait: steady, using cane and right BKA prosthesis HENT: No gross abnormalities.  Eyes: Pupils are equal and round Pulmonary: Respirations are non labored, CTAB, good air movement in all fields Cardiac: regular rhythm, + low grade murmur.         Carotid Bruits Right Left   Negative Negative   Radial pulses are 2+ palpable bilaterally   Adominal aortic pulse is not palpable                         VASCULAR EXAM: Extremities without ischemic changes, without Gangrene; without open wounds. Right BKA stump with no wounds.  1+ pretibial pitting edema in the left.                                                                                                            LE Pulses Right Left       FEMORAL  not palpable seated, obese  not palpable        POPLITEAL  not palpable   not palpable       POSTERIOR TIBIAL  BKA   not palpable        DORSALIS PEDIS      ANTERIOR TIBIAL  BKA  not palpable    Abdomen: soft, NT, no palpable masses. Skin: no rashes, no cellulitis, no ulcers noted. Musculoskeletal: no muscle wasting or atrophy. See Extremities   Neurologic: A&O X 3; appropriate affect, Sensation is normal; MOTOR FUNCTION:  moving all extremities equally, motor strength 5/5 throughout. Speech is fluent/normal. CN 2-12 intact. Psychiatric: Thought content is normal, mood appropriate for clinical situation.    DATA  Carotid Duplex (07-30-19): Right Carotid: Velocities in the right ICA are consistent with a 1-39% stenosis. Left Carotid: Velocities in the left ICA are consistent with a 1-39% stenosis. Vertebrals:  Left vertebral artery demonstrates antegrade flow. Right vertebral              artery demonstrates high resistant flow. Subclavians: Normal flow hemodynamics were seen in bilateral subclavian              arteries.   ABI Findings (07-30-19): +--------+------------------+-----+--------+--------+ Right   Rt Pressure (mmHg)IndexWaveformComment  +--------+------------------+-----+--------+--------+ [id number]                                     +--------+------------------+-----+--------+--------+  +---------+------------------+-----+----------+-------+  Left     Lt Pressure (mmHg)IndexWaveform  Comment +---------+------------------+-----+----------+-------+ Brachial 138                                      +---------+------------------+-----+----------+-------+ ATA      180               1.24                   +---------+------------------+-----+----------+-------+ PTA      137               0.94 monophasic         +---------+------------------+-----+----------+-------+ DP                              monophasic        +---------+------------------+-----+----------+-------+ Great Toe17                0.12                   +---------+------------------+-----+----------+-------+  +-------+-----------+-----------+----------------+------------+ ABI/TBIToday's ABIToday's TBIPrevious ABI    Previous TBI +-------+-----------+-----------+----------------+------------+ Right  BKA                   BKA                          +-------+-----------+-----------+----------------+------------+ Left   1.24       0.12       Non compressible0.27         +-------+-----------+-----------+----------------+------------+ Summary: Right:  Not obtained. Left: Resting left ankle-brachial index is within normal range. No evidence of significant left lower extremity arterial disease. The left toe-brachial index is abnormal. LT Great toe pressure = 17 mmHg. Although ankle brachial indices are within normal limits (0.95-1.29), arterial Doppler waveforms at the ankle suggest some component of arterial occlusive disease.    ASSESSMENT: Jose Bush is a 78 y.o. male who is s/pright below-knee amputation June 2019 by Dr. Darrick PennaFields.  His right BKA stump has completely healed. His right BKA prosthesis fits well after a recent adjustment.   History of stroke. Carotid duplex today shows 1-39% bilateral ICA stenosis.  Left ABI today shows non compressible vessels with biphasic waveforms.   He is on chronic warfarin for thrombophilia secondary to his lupus.  Fortunately he does not have DM and quit tobacco use in 1985.     PLAN:  Based on the patient's vascular studies and examination, pt will return to clinic in 9 months with left ABI; carotid duplex will not need to be checked for about 3 years (right carotid bruit, hx of stroke with left side weakness).   I advised him to notify  us if he develops pain in his left LE that wakes him at night, or a sore that does not heal.   Daily seated leg exercises discussed and demonstrated. He will be able to walk more securely once his right BKA prosthesis fits better, it seems loose to him since his stump shrank.    Pt is seeing a neurologist, history of a stroke with left arm dexterity issues, seems to be improving.     PLAN:  Based on the patient's vascular studies and examination, pt will return to clinic in 9 months with left ABI; carotid duplex will not need to be checked for  about 3 years (right carotid bruit, hx of stroke with left side weakness).   I advised him to notify us if he develops pain in his left LE that wakes him at night, or a sore that does not heal.   Daily seated leg exercises discussed and demonstrated. He is walking more securely since his right BKA prosthesis fits better, adjusted by [employer]..   I discussed in depth with the patient the nature of atherosclerosis, and emphasized the importance of maximal medical management including strict control of blood pressure, blood glucose, and lipid levels, obtaining regular exercise, and continued cessation of smoking.  The patient is aware that without maximal medical management the underlying atherosclerotic disease process will progress, limiting the benefit of any interventions.  The patient was given information about PAD including signs, symptoms, treatment, what symptoms should prompt the patient to seek immediate medical care, and risk reduction measures to take.  Charisse March, RN, MSN, FNP-C [facility] Phone: [phone]  Clinic MD: Edilia Bo  07/30/19 11:52 AM

## 2019-07-30 NOTE — Patient Instructions (Signed)
Peripheral Vascular Disease  Peripheral vascular disease (PVD) is a disease of the blood vessels that are not part of your heart and brain. A simple term for PVD is poor circulation. In most cases, PVD narrows the blood vessels that carry blood from your heart to the rest of your body. This can reduce the supply of blood to your arms, legs, and internal organs, like your stomach or kidneys. However, PVD most often affects a person's lower legs and feet. Without treatment, PVD tends to get worse. PVD can also lead to acute ischemic limb. This is when an arm or leg suddenly cannot get enough blood. This is a medical emergency. Follow these instructions at home: Lifestyle  Do not use any products that contain nicotine or tobacco, such as cigarettes and e-cigarettes. If you need help quitting, ask your doctor.  Lose weight if you are overweight. Or, stay at a healthy weight as told by your doctor.  Eat a diet that is low in fat and cholesterol. If you need help, ask your doctor.  Exercise regularly. Ask your doctor for activities that are right for you. General instructions  Take over-the-counter and prescription medicines only as told by your doctor.  Take good care of your feet: ? Wear comfortable shoes that fit well. ? Check your feet often for any cuts or sores.  Keep all follow-up visits as told by your doctor This is important. Contact a doctor if:  You have cramps in your legs when you walk.  You have leg pain when you are at rest.  You have coldness in a leg or foot.  Your skin changes.  You are unable to get or have an erection (erectile dysfunction).  You have cuts or sores on your feet that do not heal. Get help right away if:  Your arm or leg turns cold, numb, and blue.  Your arms or legs become red, warm, swollen, painful, or numb.  You have chest pain.  You have trouble breathing.  You suddenly have weakness in your face, arm, or leg.  You become very  confused or you cannot speak.  You suddenly have a very bad headache.  You suddenly cannot see. Summary  Peripheral vascular disease (PVD) is a disease of the blood vessels.  A simple term for PVD is poor circulation. Without treatment, PVD tends to get worse.  Treatment may include exercise, low fat and low cholesterol diet, and quitting smoking. This information is not intended to replace advice given to you by your health care provider. Make sure you discuss any questions you have with your health care provider. Document Released: 12/12/2009 Document Revised: 08/30/2017 Document Reviewed: 10/25/2016 Elsevier Patient Education  2020 [employer]  

## 2019-08-08 DIAGNOSIS — R531 Weakness: Secondary | ICD-10-CM | POA: Diagnosis not present

## 2019-08-08 DIAGNOSIS — R4781 Slurred speech: Secondary | ICD-10-CM | POA: Diagnosis not present

## 2019-08-08 DIAGNOSIS — R0902 Hypoxemia: Secondary | ICD-10-CM | POA: Diagnosis not present

## 2019-08-08 DIAGNOSIS — R29810 Facial weakness: Secondary | ICD-10-CM | POA: Diagnosis not present

## 2019-08-08 DIAGNOSIS — G459 Transient cerebral ischemic attack, unspecified: Secondary | ICD-10-CM | POA: Diagnosis not present

## 2019-08-08 DIAGNOSIS — I10 Essential (primary) hypertension: Secondary | ICD-10-CM | POA: Diagnosis not present

## 2019-08-08 DIAGNOSIS — I440 Atrioventricular block, first degree: Secondary | ICD-10-CM | POA: Diagnosis not present

## 2019-08-08 DIAGNOSIS — K219 Gastro-esophageal reflux disease without esophagitis: Secondary | ICD-10-CM | POA: Diagnosis not present

## 2019-08-08 DIAGNOSIS — R410 Disorientation, unspecified: Secondary | ICD-10-CM | POA: Diagnosis not present

## 2019-08-08 DIAGNOSIS — Z5329 Procedure and treatment not carried out because of patient's decision for other reasons: Secondary | ICD-10-CM | POA: Diagnosis not present

## 2019-08-08 DIAGNOSIS — R404 Transient alteration of awareness: Secondary | ICD-10-CM | POA: Diagnosis not present

## 2019-08-08 DIAGNOSIS — Z79899 Other long term (current) drug therapy: Secondary | ICD-10-CM | POA: Diagnosis not present

## 2019-08-10 ENCOUNTER — Other Ambulatory Visit: Payer: Self-pay

## 2019-08-10 ENCOUNTER — Emergency Department (HOSPITAL_COMMUNITY): Payer: Medicare HMO

## 2019-08-10 ENCOUNTER — Encounter (HOSPITAL_COMMUNITY): Payer: Self-pay | Admitting: Emergency Medicine

## 2019-08-10 ENCOUNTER — Emergency Department (HOSPITAL_COMMUNITY)
Admission: EM | Admit: 2019-08-10 | Discharge: 2019-08-10 | Disposition: A | Discharge status: Home / Self Care | Payer: Medicare HMO | Attending: Emergency Medicine | Admitting: Emergency Medicine

## 2019-08-10 DIAGNOSIS — R7989 Other specified abnormal findings of blood chemistry: Secondary | ICD-10-CM | POA: Diagnosis not present

## 2019-08-10 DIAGNOSIS — R899 Unspecified abnormal finding in specimens from other organs, systems and tissues: Secondary | ICD-10-CM

## 2019-08-10 DIAGNOSIS — G459 Transient cerebral ischemic attack, unspecified: Secondary | ICD-10-CM | POA: Diagnosis not present

## 2019-08-10 DIAGNOSIS — Z89511 Acquired absence of right leg below knee: Secondary | ICD-10-CM | POA: Diagnosis not present

## 2019-08-10 DIAGNOSIS — Z96641 Presence of right artificial hip joint: Secondary | ICD-10-CM | POA: Insufficient documentation

## 2019-08-10 DIAGNOSIS — Z79899 Other long term (current) drug therapy: Secondary | ICD-10-CM | POA: Diagnosis not present

## 2019-08-10 DIAGNOSIS — S2242XA Multiple fractures of ribs, left side, initial encounter for closed fracture: Secondary | ICD-10-CM | POA: Diagnosis not present

## 2019-08-10 DIAGNOSIS — I441 Atrioventricular block, second degree: Secondary | ICD-10-CM | POA: Diagnosis not present

## 2019-08-10 DIAGNOSIS — R531 Weakness: Secondary | ICD-10-CM | POA: Diagnosis not present

## 2019-08-10 DIAGNOSIS — I10 Essential (primary) hypertension: Secondary | ICD-10-CM | POA: Diagnosis not present

## 2019-08-10 DIAGNOSIS — Z96652 Presence of left artificial knee joint: Secondary | ICD-10-CM | POA: Diagnosis not present

## 2019-08-10 DIAGNOSIS — Z7901 Long term (current) use of anticoagulants: Secondary | ICD-10-CM | POA: Insufficient documentation

## 2019-08-10 DIAGNOSIS — R7889 Finding of other specified substances, not normally found in blood: Secondary | ICD-10-CM | POA: Insufficient documentation

## 2019-08-10 DIAGNOSIS — Z89421 Acquired absence of other right toe(s): Secondary | ICD-10-CM | POA: Diagnosis not present

## 2019-08-10 DIAGNOSIS — I82503 Chronic embolism and thrombosis of unspecified deep veins of lower extremity, bilateral: Secondary | ICD-10-CM | POA: Diagnosis not present

## 2019-08-10 DIAGNOSIS — R799 Abnormal finding of blood chemistry, unspecified: Secondary | ICD-10-CM | POA: Diagnosis not present

## 2019-08-10 DIAGNOSIS — R8290 Unspecified abnormal findings in urine: Secondary | ICD-10-CM | POA: Diagnosis not present

## 2019-08-10 DIAGNOSIS — R0781 Pleurodynia: Secondary | ICD-10-CM | POA: Diagnosis not present

## 2019-08-10 DIAGNOSIS — I639 Cerebral infarction, unspecified: Secondary | ICD-10-CM | POA: Diagnosis not present

## 2019-08-10 DIAGNOSIS — Z6832 Body mass index (BMI) 32.0-32.9, adult: Secondary | ICD-10-CM | POA: Diagnosis not present

## 2019-08-10 DIAGNOSIS — R6883 Chills (without fever): Secondary | ICD-10-CM | POA: Diagnosis not present

## 2019-08-10 LAB — BASIC METABOLIC PANEL
Anion gap: 9 (ref 5–15)
BUN: 17 mg/dL (ref 8–23)
CO2: 25 mmol/L (ref 22–32)
Calcium: 8.9 mg/dL (ref 8.9–10.3)
Chloride: 100 mmol/L (ref 98–111)
Creatinine, Ser: 0.73 mg/dL (ref 0.61–1.24)
GFR calc Af Amer: >60 mL/min (ref >60)
GFR calc non Af Amer: >60 mL/min (ref >60)
Glucose, Bld: 102 mg/dL — ABNORMAL HIGH (ref 70–99)
Potassium: 3.6 mmol/L (ref 3.5–5.1)
Sodium: 134 mmol/L — ABNORMAL LOW (ref 135–145)

## 2019-08-10 LAB — CBC
HCT: 36.7 % — ABNORMAL LOW (ref 39.0–52.0)
Hemoglobin: 11.4 g/dL — ABNORMAL LOW (ref 13.0–17.0)
MCH: 29.2 pg (ref 26.0–34.0)
MCHC: 31.1 g/dL (ref 30.0–36.0)
MCV: 94.1 fL (ref 80.0–100.0)
Platelets: 158 10*3/uL (ref 150–400)
RBC: 3.9 MIL/uL — ABNORMAL LOW (ref 4.22–5.81)
RDW: 14.5 % (ref 11.5–15.5)
WBC: 9.2 10*3/uL (ref 4.0–10.5)
nRBC: 0 % (ref 0.0–0.2)

## 2019-08-10 LAB — TROPONIN I (HIGH SENSITIVITY)
Troponin I (High Sensitivity): 9 ng/L (ref <18)
Troponin I (High Sensitivity): 8 ng/L (ref <18)

## 2019-08-10 NOTE — Discharge Instructions (Signed)
See your Physician for recheck on Monday.  Return if any problems.  

## 2019-08-10 NOTE — ED Provider Notes (Signed)
[facility] EMERGENCY DEPARTMENT Provider Note   CSN: [id number] Arrival date & time: 08/10/19  1418     History   Chief Complaint Chief Complaint  Patient presents with  . Abnormal Lab    HPI Jose Bush is a 78 y.o. male.     Patient reports he was advised by his primary care doctor to come to the emergency department because his troponin was rising.  Troponin is reported to have been 0.03 on Saturday and 0.06 today.  Patient states he is not having any symptoms.  He went to the emergency department at [facility] on Saturday.  Patient states he decided not to stay.  He was seen by his primary care physician today who did labs and a urine he was given a shot of antibiotics and given a prescription for antibiotics for a UTI.  Patient states he was called and told to come here because his troponin was increasing.  Patient denies any current symptoms he is not having chest pain he denies any shortness of breath he denies any weakness or fatigue.  The history is provided by the patient. No language interpreter was used.  Abnormal Lab Time since result:  Today Patient referred by:  PCP Resulting agency:  External Result type: cardiac     Past Medical History:  Diagnosis Date  . Arthritis   . Chest pain, unspecified   . DVT (deep venous thrombosis) ([place])   . GERD (gastroesophageal reflux disease)   . Heart murmur    hx of   . History of hiatal hernia   . HTN (hypertension)   . Lupus ([place])   . Lupus ([place])   . Peripheral vascular disease (HCC)    nonviable tissue   . Shingles    hx of   . Stroke [facility]) 2010   'MILD" per pt-  weakness left side  . Tinnitus   . Ulcer of abdomen wall ([place])   . Wears dentures   . Wears glasses     Patient Active Problem List   Diagnosis Date Noted  . Non-healing wound of lower extremity 03/18/2018  . Nonhealing surgical wound 03/12/2018  . PAD (peripheral artery disease) ([place]) 03/03/2018  . PVD (peripheral vascular disease) ([place])  03/03/2018  . Iron deficiency anemia   . Atrophic gastritis without hemorrhage   . OA (osteoarthritis) of hip 05/01/2017  . OA (osteoarthritis) of knee 12/13/2014  . Other pancytopenia ([place]) 02/04/2013  . Fever 02/02/2013  . Neutropenia ([place]) 02/02/2013  . Weakness 02/01/2013  . Rigors 02/01/2013  . Headache(784.0) 02/01/2013  . Lupus ([place]) 02/01/2013  . Thrombocytopenia ([place]) 02/01/2013  . DVT (deep venous thrombosis) ([place])   . HYPERTENSION, UNSPECIFIED 06/22/2009  . CHEST PAIN-UNSPECIFIED 06/22/2009    Past Surgical History:  Procedure Laterality Date  . ABDOMINAL AORTOGRAM W/LOWER EXTREMITY N/A 02/17/2018   Procedure: ABDOMINAL AORTOGRAM W/LOWER EXTREMITY;  Surgeon: [name], MD;  Location: [place] CV LAB;  Service: Cardiovascular;  Laterality: N/A;  . AMPUTATION Right 03/03/2018   Procedure: AMPUTATION RIGHT SECOND TOE;  Surgeon: [name], MD;  Location: [place];  Service: Vascular;  Laterality: Right;  . AMPUTATION Right 03/18/2018   Procedure: AMPUTATION BELOW KNEE;  Surgeon: [name], MD;  Location: [place];  Service: Vascular;  Laterality: Right;  . BELOW KNEE LEG AMPUTATION Right 03/18/2018  . bilateral cataract surgery     . BIOPSY N/A 10/22/2017   Procedure: BIOPSY;  Surgeon: [name], MD;  [facility];  Service: Gastroenterology;  Laterality: N/A;  . COLONOSCOPY N/A 09/18/2016   Dr. Lovell SheehanJenkins: normal  . ESOPHAGOGASTRODUODENOSCOPY N/A 10/22/2017   Dr. Lovell SheehanJenkins: normal duodenum, gastric mucosal atrophy but negative H.pylori (Clotest negative), normal GE junction  . GIVENS CAPSULE STUDY N/A 01/27/2018   Procedure: GIVENS CAPSULE STUDY;  Surgeon: Corbin Adeourk, Robert M, MD;  [facility];  Service: Endoscopy;  Laterality: N/A;  7:00am  . HERNIA REPAIR     2010/laparoscopic/right  . ivc filter     01/2013   . KNEE ARTHROSCOPY Right    2007  . MULTIPLE TOOTH EXTRACTIONS    . PERIPHERAL VASCULAR BALLOON ANGIOPLASTY   02/17/2018   Procedure: PERIPHERAL VASCULAR BALLOON ANGIOPLASTY;  Surgeon: Maeola Harmanain, Brandon Christopher, MD;  [facility];  Service: Cardiovascular;;  . TOTAL HIP ARTHROPLASTY Right 05/01/2017   Procedure: RIGHT TOTAL HIP ARTHROPLASTY ANTERIOR APPROACH;  Surgeon: Ollen GrossAluisio, Frank, MD;  [facility];  Service: Orthopedics;  Laterality: Right;  . TOTAL KNEE ARTHROPLASTY Left 12/13/2014   Procedure: LEFT TOTAL KNEE ARTHROPLASTY;  Surgeon: Ollen GrossFrank Aluisio, MD;  [facility];  Service: Orthopedics;  Laterality: Left;  . TOTAL KNEE ARTHROPLASTY Right 03/21/2015   Procedure: RIGHT TOTAL KNEE ARTHROPLASTY;  Surgeon: Ollen GrossFrank Aluisio, MD;  [facility];  Service: Orthopedics;  Laterality: Right;        Home Medications    Prior to Admission medications   Medication Sig Start Date End Date Taking? Authorizing Provider  acetaminophen (TYLENOL) 500 MG tablet Take 500 mg by mouth every 6 (six) hours as needed for moderate pain or headache.     [provider]  Bioflavonoid Products (ESTER C PO) Take 500 mg by mouth daily.    [provider]  docusate sodium (COLACE) 100 MG capsule Take 100 mg by mouth daily.     [provider]  feeding supplement, ENSURE ENLIVE, (ENSURE ENLIVE) LIQD Take 237 mLs by mouth 2 (two) times daily between meals. 04/11/18   Rhyne, Ames CoupeSamantha J, PA-C  finasteride (PROSCAR) 5 MG tablet  07/15/19   [provider]  hydroxychloroquine (PLAQUENIL) 200 MG tablet Take 200 mg by mouth 2 (two) times daily.    [provider]  lactose free nutrition (BOOST PLUS) LIQD Take 237 mLs by mouth 3 (three) times daily with meals. 04/11/18   Rhyne, Ames CoupeSamantha J, PA-C  lisinopril-hydrochlorothiazide (PRINZIDE,ZESTORETIC) 10-12.5 MG per tablet Take 1 tablet by mouth daily with breakfast.     [provider]  Menthol, Topical Analgesic, (ICY HOT EX) Apply 1 application topically 2 (two) times daily as needed (left knee.).     [provider]  Misc Natural Products (URINOZINC PO) Take 1 capsule by mouth 2 (two) times daily.     [provider]  Multiple Vitamin (MULTIVITAMIN WITH MINERALS) TABS tablet Take 1 tablet by mouth daily. Centrum Silver    [provider]  OMEGA-3 KRILL OIL PO Take 1 capsule by mouth daily.    [provider]  omeprazole (PRILOSEC) 20 MG capsule Take 20 mg by mouth daily. 11/06/17   [provider]  tamsulosin (FLOMAX) 0.4 MG CAPS capsule Take 2 capsules (0.8 mg total) by mouth daily. 04/11/18   Rhyne, Ames CoupeSamantha J, PA-C  warfarin (COUMADIN) 5 MG tablet Take 1 tablet (5 mg total) by mouth every morning. Take Coumadin for three weeks for postoperative protocol and then the patient may resume their previous Coumadin home regimen.  The dose may need to be adjusted  based upon the INR.  Please follow the INR and titrate Coumadin dose for a therapeutic range between 2.0 and 3.0 INR.  After completing the three weeks of Coumadin, the patient may resume their previous Coumadin home regimen. Patient taking differently: Take 5 mg by mouth daily.  03/22/15   Perkins, Alexzandrew L, PA-C    Family History Family History  Problem Relation Age of Onset  . Hypertension Other   . Stroke Mother   . Colon cancer Neg Hx     Social History Social History   Tobacco Use  . Smoking status: Former Smoker    Packs/day: 0.50    Years: 40.00    Pack years: 20.00    Types: Cigarettes    Quit date: 10/02/1983    Years since quitting: 35.8  . Smokeless tobacco: Never Used  Substance Use Topics  . Alcohol use: No  . Drug use: No     Allergies   Patient has no known allergies.   Review of Systems Review of Systems  Constitutional: Negative for fever.  Respiratory: Negative for cough, chest tightness and shortness of breath.   Cardiovascular: Negative for chest pain, palpitations and leg swelling.  All other systems reviewed and are negative.    Physical Exam Updated  Vital Signs BP (!) 157/72 (BP Location: Right Arm)   Pulse 73   Temp 97.7 F (36.5 C) (Oral)   Resp 20   Ht 5\' 8"  (1.727 m)   Wt 93.4 kg   SpO2 98%   BMI 31.31 kg/m   Physical Exam Vitals signs and nursing note reviewed.  Constitutional:      Appearance: He is well-developed.  HENT:     Head: Normocephalic and atraumatic.     Nose: Nose normal.     Mouth/Throat:     Mouth: Mucous membranes are moist.  Eyes:     Conjunctiva/sclera: Conjunctivae normal.  Neck:     Musculoskeletal: Neck supple.  Cardiovascular:     Rate and Rhythm: Normal rate and regular rhythm.     Heart sounds: No murmur.  Pulmonary:     Effort: Pulmonary effort is normal. No respiratory distress.     Breath sounds: Normal breath sounds.  Chest:     Chest wall: No tenderness.  Abdominal:     General: Abdomen is flat.     Palpations: Abdomen is soft.     Tenderness: There is no abdominal tenderness.  Musculoskeletal: Normal range of motion.  Skin:    General: Skin is warm and dry.  Neurological:     General: No focal deficit present.     Mental Status: He is alert.  Psychiatric:        Mood and Affect: Mood normal.      ED Treatments / Results  Labs (all labs ordered are listed, but only abnormal results are displayed) Labs Reviewed  BASIC METABOLIC PANEL - Abnormal; Notable for the following components:      Result Value   Sodium 134 (*)    Glucose, Bld 102 (*)    All other components within normal limits  CBC - Abnormal; Notable for the following components:   RBC 3.90 (*)    Hemoglobin 11.4 (*)    HCT 36.7 (*)    All other components within normal limits  TROPONIN I (HIGH SENSITIVITY)  TROPONIN I (HIGH SENSITIVITY)    EKG EKG Interpretation  Date/Time:  Monday August 10 2019 14:53:56 EST Ventricular Rate:  70 PR Interval:  210  QRS Duration: 86 QT Interval:  404 QTC Calculation: 436 R Axis:   18 Text Interpretation: Sinus rhythm with 1st degree A-V block Otherwise normal  ECG since last tracing no significant change Confirmed by Eber Hong ([id number]) on 08/10/2019 5:41:54 PM   Radiology Dg Chest 2 View  Result Date: 08/10/2019 CLINICAL DATA:  Elevated troponin. EXAM: CHEST - 2 VIEW COMPARISON:  03/22/2018. FINDINGS: Mediastinum and hilar structures normal. Mild cardiomegaly with normal pulmonary vascularity. Mild bibasilar subsegmental atelectasis/scarring again noted. No acute abnormality identified. IMPRESSION: Number mild bibasilar subsegmental atelectasis/scarring again noted. No acute abnormality identified. 2.  Mild cardiomegaly.  No pulmonary venous congestion. Electronically Signed   By: Maisie Fus  Register   On: 08/10/2019 15:26    Procedures Procedures (including critical care time)  Medications Ordered in ED Medications - No data to display   Initial Impression / Assessment and Plan / ED Course  I have reviewed the triage vital signs and the nursing notes.  Pertinent labs & imaging results that were available during my care of the patient were reviewed by me and considered in my medical decision making (see chart for details).        MDM troponin is negative x2 EKG is unremarkable chest x-ray is normal. Pt feels well over all.  Pt is currently afebrile no tachycardia.   Dr. Hyacinth Meeker in to see and examine.   Final Clinical Impressions(s) / ED Diagnoses   Final diagnoses:  Abnormal laboratory test    ED Discharge Orders    None    An After Visit Summary was printed and given to the patient.    Osie Cheeks 08/10/19 1913    Eber Hong, MD 08/10/19 [phone]

## 2019-08-10 NOTE — ED Triage Notes (Signed)
Pt states that he was sent here to checked for abnormal labs from [facility]. His trop was 0.03 on Saturday and today on 0.06 he does have an uti.

## 2019-08-13 DIAGNOSIS — Z6831 Body mass index (BMI) 31.0-31.9, adult: Secondary | ICD-10-CM | POA: Diagnosis not present

## 2019-08-13 DIAGNOSIS — I639 Cerebral infarction, unspecified: Secondary | ICD-10-CM | POA: Diagnosis not present

## 2019-08-13 DIAGNOSIS — N390 Urinary tract infection, site not specified: Secondary | ICD-10-CM | POA: Diagnosis not present

## 2019-08-13 DIAGNOSIS — I10 Essential (primary) hypertension: Secondary | ICD-10-CM | POA: Diagnosis not present

## 2019-08-13 DIAGNOSIS — G459 Transient cerebral ischemic attack, unspecified: Secondary | ICD-10-CM | POA: Diagnosis not present

## 2019-08-13 DIAGNOSIS — R531 Weakness: Secondary | ICD-10-CM | POA: Diagnosis not present

## 2019-08-13 DIAGNOSIS — I82503 Chronic embolism and thrombosis of unspecified deep veins of lower extremity, bilateral: Secondary | ICD-10-CM | POA: Diagnosis not present

## 2019-08-20 DIAGNOSIS — I82499 Acute embolism and thrombosis of other specified deep vein of unspecified lower extremity: Secondary | ICD-10-CM | POA: Diagnosis not present

## 2019-08-20 DIAGNOSIS — N390 Urinary tract infection, site not specified: Secondary | ICD-10-CM | POA: Diagnosis not present

## 2019-08-31 DIAGNOSIS — I82501 Chronic embolism and thrombosis of unspecified deep veins of right lower extremity: Secondary | ICD-10-CM | POA: Diagnosis not present

## 2019-08-31 DIAGNOSIS — I639 Cerebral infarction, unspecified: Secondary | ICD-10-CM | POA: Diagnosis not present

## 2019-09-22 DIAGNOSIS — Z96641 Presence of right artificial hip joint: Secondary | ICD-10-CM | POA: Diagnosis not present

## 2019-09-22 DIAGNOSIS — K219 Gastro-esophageal reflux disease without esophagitis: Secondary | ICD-10-CM | POA: Diagnosis not present

## 2019-09-22 DIAGNOSIS — Z86718 Personal history of other venous thrombosis and embolism: Secondary | ICD-10-CM | POA: Diagnosis not present

## 2019-09-22 DIAGNOSIS — M19011 Primary osteoarthritis, right shoulder: Secondary | ICD-10-CM | POA: Diagnosis not present

## 2019-09-22 DIAGNOSIS — N400 Enlarged prostate without lower urinary tract symptoms: Secondary | ICD-10-CM | POA: Diagnosis not present

## 2019-09-22 DIAGNOSIS — I10 Essential (primary) hypertension: Secondary | ICD-10-CM | POA: Diagnosis not present

## 2019-09-22 DIAGNOSIS — M329 Systemic lupus erythematosus, unspecified: Secondary | ICD-10-CM | POA: Diagnosis not present

## 2019-09-22 DIAGNOSIS — Z89511 Acquired absence of right leg below knee: Secondary | ICD-10-CM | POA: Diagnosis not present

## 2019-09-22 DIAGNOSIS — M19012 Primary osteoarthritis, left shoulder: Secondary | ICD-10-CM | POA: Diagnosis not present

## 2019-09-30 DIAGNOSIS — D6861 Antiphospholipid syndrome: Secondary | ICD-10-CM | POA: Diagnosis not present

## 2019-09-30 DIAGNOSIS — M2550 Pain in unspecified joint: Secondary | ICD-10-CM | POA: Diagnosis not present

## 2019-09-30 DIAGNOSIS — R29898 Other symptoms and signs involving the musculoskeletal system: Secondary | ICD-10-CM | POA: Diagnosis not present

## 2019-09-30 DIAGNOSIS — M150 Primary generalized (osteo)arthritis: Secondary | ICD-10-CM | POA: Diagnosis not present

## 2019-09-30 DIAGNOSIS — Z79899 Other long term (current) drug therapy: Secondary | ICD-10-CM | POA: Diagnosis not present

## 2019-09-30 DIAGNOSIS — M329 Systemic lupus erythematosus, unspecified: Secondary | ICD-10-CM | POA: Diagnosis not present

## 2019-09-30 DIAGNOSIS — Z6831 Body mass index (BMI) 31.0-31.9, adult: Secondary | ICD-10-CM | POA: Diagnosis not present

## 2019-09-30 DIAGNOSIS — R5382 Chronic fatigue, unspecified: Secondary | ICD-10-CM | POA: Diagnosis not present

## 2019-09-30 DIAGNOSIS — E669 Obesity, unspecified: Secondary | ICD-10-CM | POA: Diagnosis not present

## 2019-10-01 DIAGNOSIS — I82503 Chronic embolism and thrombosis of unspecified deep veins of lower extremity, bilateral: Secondary | ICD-10-CM | POA: Diagnosis not present

## 2019-10-10 DIAGNOSIS — R300 Dysuria: Secondary | ICD-10-CM | POA: Diagnosis not present

## 2019-10-10 DIAGNOSIS — Z6831 Body mass index (BMI) 31.0-31.9, adult: Secondary | ICD-10-CM | POA: Diagnosis not present

## 2019-10-23 DIAGNOSIS — I82503 Chronic embolism and thrombosis of unspecified deep veins of lower extremity, bilateral: Secondary | ICD-10-CM | POA: Diagnosis not present

## 2019-10-23 DIAGNOSIS — R8290 Unspecified abnormal findings in urine: Secondary | ICD-10-CM | POA: Diagnosis not present

## 2019-10-27 DIAGNOSIS — I82503 Chronic embolism and thrombosis of unspecified deep veins of lower extremity, bilateral: Secondary | ICD-10-CM | POA: Diagnosis not present

## 2019-10-30 DIAGNOSIS — I10 Essential (primary) hypertension: Secondary | ICD-10-CM | POA: Diagnosis not present

## 2019-10-30 DIAGNOSIS — I82503 Chronic embolism and thrombosis of unspecified deep veins of lower extremity, bilateral: Secondary | ICD-10-CM | POA: Diagnosis not present

## 2019-11-10 DIAGNOSIS — I82503 Chronic embolism and thrombosis of unspecified deep veins of lower extremity, bilateral: Secondary | ICD-10-CM | POA: Diagnosis not present

## 2019-11-10 DIAGNOSIS — Z6832 Body mass index (BMI) 32.0-32.9, adult: Secondary | ICD-10-CM | POA: Diagnosis not present

## 2019-11-10 DIAGNOSIS — R300 Dysuria: Secondary | ICD-10-CM | POA: Diagnosis not present

## 2019-11-10 DIAGNOSIS — R339 Retention of urine, unspecified: Secondary | ICD-10-CM | POA: Diagnosis not present

## 2019-11-11 ENCOUNTER — Telehealth: Payer: Self-pay | Admitting: Urology

## 2019-11-11 NOTE — Telephone Encounter (Signed)
Patient is a current pt of Dr Retta Diones. His wife called because he is having  Recurrent UTIs since January and keeps having to see PCP for this. Please advise.

## 2019-11-11 NOTE — Telephone Encounter (Signed)
Please schedule next available.

## 2019-11-23 ENCOUNTER — Telehealth: Payer: Self-pay | Admitting: Urology

## 2019-11-23 NOTE — Telephone Encounter (Signed)
Appt scheduled with wife for tomorrow at 11:30

## 2019-11-23 NOTE — Telephone Encounter (Signed)
Pts wife called and requests a nurse return her call. She states she called a few weeks ago about pts recurrent UTI issue and was given an appointment in April. She says he cant wait that long, he woke up today feeling another one coming on.

## 2019-11-24 ENCOUNTER — Ambulatory Visit (INDEPENDENT_AMBULATORY_CARE_PROVIDER_SITE_OTHER): Payer: Medicare HMO | Admitting: Urology

## 2019-11-24 ENCOUNTER — Encounter: Payer: Self-pay | Admitting: Urology

## 2019-11-24 ENCOUNTER — Other Ambulatory Visit: Payer: Self-pay

## 2019-11-24 ENCOUNTER — Other Ambulatory Visit (HOSPITAL_COMMUNITY)
Admission: RE | Admit: 2019-11-24 | Discharge: 2019-11-24 | Disposition: A | Discharge status: Home / Self Care | Payer: Medicare HMO | Source: Ambulatory Visit | Attending: Urology | Admitting: Urology

## 2019-11-24 VITALS — BP 141/69 | HR 63 | Temp 97.0°F | Ht 68.0 in | Wt 210.0 lb

## 2019-11-24 DIAGNOSIS — N390 Urinary tract infection, site not specified: Secondary | ICD-10-CM | POA: Insufficient documentation

## 2019-11-24 LAB — BLADDER SCAN AMB NON-IMAGING: Scan Result: 90.9

## 2019-11-24 LAB — POCT URINALYSIS DIPSTICK
Bilirubin, UA: NEGATIVE
Blood, UA: NEGATIVE
Glucose, UA: NEGATIVE
Ketones, UA: NEGATIVE
Nitrite, UA: NEGATIVE
Protein, UA: NEGATIVE
Spec Grav, UA: 1.01 (ref 1.010–1.025)
Urobilinogen, UA: 0.2 E.U./dL
pH, UA: 8.5 — AB (ref 5.0–8.0)

## 2019-11-24 NOTE — Progress Notes (Signed)
Urological Symptom Review  Patient is experiencing the following symptoms: Frequent urination Hard to postpone urination Get up at night to urinate Leakage of urine Stream starts and stops Trouble starting stream Urinary tract infection   Review of Systems  Gastrointestinal (upper)  : Negative for upper GI symptoms  Gastrointestinal (lower) : Negative for lower GI symptoms  Constitutional : Negative for symptoms  Skin: Negative for skin symptoms  Eyes: Negative for eye symptoms  Ear/Nose/Throat : Negative for Ear/Nose/Throat symptoms  Hematologic/Lymphatic: Easy bruising  Cardiovascular : Negative for cardiovascular symptoms  Respiratory : Negative for respiratory symptoms  Endocrine: Negative for endocrine symptoms  Musculoskeletal: Joint pain  Neurological: Headaches  Psychologic: Negative for psychiatric symptoms

## 2019-11-24 NOTE — Progress Notes (Signed)
H&P  Chief Complaint: BPH w/ LUTS  History of Present Illness:   2.23.2021: He presents today having had recurrent urinary tract infections over the last few months. When infected, he reports feeling fatigued with low strength (one episode this last December produced chills w/ changes in mental status). He feels as though these sx's have only been associated with infections though he does state that he has had episodes of weakness without infection. For his last two infections he was given an injection of an antibiotic in office followed by a 10 day course of abx -- his sx's resolved somewhat following these injections and improved steadily over the course of taking his abx but have recurred each time. He denies any urinary sx's or changes in urinary pattern though he does note that his urine may have been somewhat more cloudy recently. Of note, his IPSS today reflects notably worse urinary pattern relative to what he reported at last visit (2 --> 3x nocturia, now having to strain, etc). He denies a personal hx of KS's.   IPSS Questionnaire (AUA-7): Over the past month.   1)  How often have you had a sensation of not emptying your bladder completely after you finish urinating?  3 - About half the time  2)  How often have you had to urinate again less than two hours after you finished urinating? 4 - More than half the time  3)  How often have you found you stopped and started again several times when you urinated?  2 - Less than half the time  4) How difficult have you found it to postpone urination?  3 - About half the time  5) How often have you had a weak urinary stream?  3 - About half the time  6) How often have you had to push or strain to begin urination?  2 - Less than half the time  7) How many times did you most typically get up to urinate from the time you went to bed until the time you got up in the morning?  3 - 3 times  Total score:  0-7 mildly symptomatic   8-19 moderately symptomatic    20-35 severely symptomatic   IPSS: 20 QoL: 3  (below copied from [facility] records):  BPH:  Jose Bush is a 79 year-old male established patient who is here for an enlarged prostate follow-up evaluation.  He is currently taking tamsulosin and rapaflo.   8.13.2019: Initial visit for BPH. He had been on Flomax previously but took himself off because of worry of side effects. He did have retention issues following BKA. He had moderate voiding symptomatology had his 1st visit. He was placed on tamsulosin.   11.19.2019: He is voiding better. He has been treated with abx for recent a UTI--presented with dysuria. PVR volume 197 ml. He was continued on tamsulosin but twice daily. A   2.11.2020: Stream might be slowing down some. He is not currently taking his tamsulosin twice a day.   8.18.2020: He continues to only take tamsulosin 1x a day. He discontinued finasteride because he was too dizzy while taking it. He has fallen but this was mostly due to his previously ill-fitted prosthetic. He is mostly satisfied with his urinary pattern and feels like he is emptying quite well.   Past Medical History:  Diagnosis Date  . Arthritis   . Chest pain, unspecified   . DVT (deep venous thrombosis) (HCC)   . GERD (gastroesophageal reflux disease)   .  Heart murmur    hx of   . History of hiatal hernia   . HTN (hypertension)   . Lupus ([place])   . Lupus ([place])   . Peripheral vascular disease (HCC)    nonviable tissue   . Shingles    hx of   . Stroke [facility]) 2010   'MILD" per pt-  weakness left side  . Tinnitus   . Ulcer of abdomen wall ([place])   . Wears dentures   . Wears glasses     Past Surgical History:  Procedure Laterality Date  . ABDOMINAL AORTOGRAM W/LOWER EXTREMITY N/A 02/17/2018   Procedure: ABDOMINAL AORTOGRAM W/LOWER EXTREMITY;  Surgeon: [name], MD;  Location: [place] CV LAB;  Service: Cardiovascular;  Laterality: N/A;  . AMPUTATION Right 03/03/2018   Procedure:  AMPUTATION RIGHT SECOND TOE;  Surgeon: [name], MD;  Location: [place];  Service: Vascular;  Laterality: Right;  . AMPUTATION Right 03/18/2018   Procedure: AMPUTATION BELOW KNEE;  Surgeon: [name], MD;  Location: [place];  Service: Vascular;  Laterality: Right;  . BELOW KNEE LEG AMPUTATION Right 03/18/2018  . bilateral cataract surgery     . BIOPSY N/A 10/22/2017   Procedure: BIOPSY;  Surgeon: [name], MD;  [facility];  Service: Gastroenterology;  Laterality: N/A;  . COLONOSCOPY N/A 09/18/2016   Dr. [name]: normal  . ESOPHAGOGASTRODUODENOSCOPY N/A 10/22/2017   Dr. [name]: normal duodenum, gastric mucosal atrophy but negative H.pylori (Clotest negative), normal GE junction  . GIVENS CAPSULE STUDY N/A 01/27/2018   Procedure: GIVENS CAPSULE STUDY;  Surgeon: [name], MD;  [facility];  Service: Endoscopy;  Laterality: N/A;  7:00am  . HERNIA REPAIR     2010/laparoscopic/right  . ivc filter     01/2013   . KNEE ARTHROSCOPY Right    2007  . MULTIPLE TOOTH EXTRACTIONS    . PERIPHERAL VASCULAR BALLOON ANGIOPLASTY  02/17/2018   Procedure: PERIPHERAL VASCULAR BALLOON ANGIOPLASTY;  Surgeon: [name], MD;  Location: [place] CV LAB;  Service: Cardiovascular;;  . TOTAL HIP ARTHROPLASTY Right 05/01/2017   Procedure: RIGHT TOTAL HIP ARTHROPLASTY ANTERIOR APPROACH;  Surgeon: [name], MD;  [facility];  Service: Orthopedics;  Laterality: Right;  . TOTAL KNEE ARTHROPLASTY Left 12/13/2014   Procedure: LEFT TOTAL KNEE ARTHROPLASTY;  Surgeon: [name], MD;  [facility];  Service: Orthopedics;  Laterality: Left;  . TOTAL KNEE ARTHROPLASTY Right 03/21/2015   Procedure: RIGHT TOTAL KNEE ARTHROPLASTY;  Surgeon: [name], MD;  [facility];  Service: Orthopedics;  Laterality: Right;    Home Medications:  Allergies as of 11/24/2019   No Known Allergies     Medication List       Accurate as of November 24, 2019  12:00 PM. If you have any questions, ask your nurse or doctor.        acetaminophen 500 MG tablet Commonly known as: TYLENOL Take 500 mg by mouth every 6 (six) hours as needed for moderate pain or headache.   docusate sodium 100 MG capsule Commonly known as: COLACE Take 100 mg by mouth daily.   ESTER C PO Take 500 mg by mouth daily.   feeding supplement (ENSURE ENLIVE) Liqd Take 237 mLs by mouth 2 (two) times daily between meals.   lactose free nutrition Liqd Take 237 mLs by mouth 3 (three) times daily with meals.   finasteride 5 MG tablet Commonly known as: PROSCAR   hydroxychloroquine 200 MG  tablet Commonly known as: PLAQUENIL Take 200 mg by mouth 2 (two) times daily.   ICY HOT EX Apply 1 application topically 2 (two) times daily as needed (left knee.).   lisinopril-hydrochlorothiazide 10-12.5 MG tablet Commonly known as: ZESTORETIC Take 1 tablet by mouth daily with breakfast.   multivitamin with minerals Tabs tablet Take 1 tablet by mouth daily. Centrum Silver   OMEGA-3 KRILL OIL PO Take 1 capsule by mouth daily.   omeprazole 20 MG capsule Commonly known as: PRILOSEC Take 20 mg by mouth daily.   predniSONE 5 MG tablet Commonly known as: DELTASONE   tamsulosin 0.4 MG Caps capsule Commonly known as: FLOMAX Take 2 capsules (0.8 mg total) by mouth daily.   URINOZINC PO Take 1 capsule by mouth 2 (two) times daily.   warfarin 5 MG tablet Commonly known as: COUMADIN Take 1 tablet (5 mg total) by mouth every morning. Take Coumadin for three weeks for postoperative protocol and then the patient may resume their previous Coumadin home regimen.  The dose may need to be adjusted based upon the INR.  Please follow the INR and titrate Coumadin dose for a therapeutic range between 2.0 and 3.0 INR.  After completing the three weeks of Coumadin, the patient may resume their previous Coumadin home regimen. What changed:   when to take this  additional instructions         Allergies: No Known Allergies  Family History  Problem Relation Age of Onset  . Hypertension Other   . Stroke Mother   . Colon cancer Neg Hx     Social History:  reports that he quit smoking about 36 years ago. His smoking use included cigarettes. He has a 20.00 pack-year smoking history. He has never used smokeless tobacco. He reports that he does not drink alcohol or use drugs.  ROS: A complete review of systems was performed.  All systems are negative except for pertinent findings as noted.  Physical Exam: Vital signs in last 24 hours: BP (!) 141/69   Pulse 63   Temp (!) 97 F (36.1 C)   Ht 5\' 8"  (1.727 m)   Wt 210 lb (95.3 kg)   BMI 31.93 kg/m  Constitutional:  Alert and oriented, No acute distress Cardiovascular: Regular rate  Respiratory: Normal respiratory effort Lymphatic: No lymphadenopathy Neurologic: Grossly intact, no focal deficits Psychiatric: Normal mood and affect  Laboratory Data:  No results for input(s): WBC, HGB, HCT, PLT in the last 72 hours.  No results for input(s): NA, K, CL, GLUCOSE, BUN, CALCIUM, CREATININE in the last 72 hours.  Invalid input(s): CO3   Results for orders placed or performed in visit on 11/24/19 (from the past 24 hour(s))  POCT urinalysis dipstick     Status: Abnormal   Collection Time: 11/24/19 11:57 AM  Result Value Ref Range   Color, UA yellow    Clarity, UA     Glucose, UA Negative Negative   Bilirubin, UA neg    Ketones, UA neg    Spec Grav, UA 1.010 1.010 - 1.025   Blood, UA neg    pH, UA 8.5 (A) 5.0 - 8.0   Protein, UA Negative Negative   Urobilinogen, UA 0.2 0.2 or 1.0 E.U./dL   Nitrite, UA neg    Leukocytes, UA Small (1+) (A) Negative   Appearance clear    Odor     No results found for this or any previous visit (from the past 240 hour(s)).  I have reviewed prior pt  notes  I have reviewed notes from referring/previous physicians  I have reviewed urinalysis resultsI have reviewed prior PSA  results   Impression/Assessment:  UA today appears clear for any signs of infection -- will culture. It is possible that his fatigue and weakness could be attributable to age-related changes though it is also certainly possible this could be secondary to these recurrent infections. PVR today was 90.9 mL -- I am not too concerned with this as an infection risk.   Plan:  1. Urine culture -- will forward results and treated if needed  2. I will get medical records from his PCP regarding his recent UTI's.   3. Return for follow-up in 3 mo's. We will discuss BPH interventions if his sx's persist.

## 2019-11-25 ENCOUNTER — Other Ambulatory Visit: Payer: Self-pay

## 2019-11-25 ENCOUNTER — Telehealth: Payer: Self-pay

## 2019-11-25 DIAGNOSIS — I82503 Chronic embolism and thrombosis of unspecified deep veins of lower extremity, bilateral: Secondary | ICD-10-CM | POA: Diagnosis not present

## 2019-11-25 NOTE — Telephone Encounter (Signed)
Pt wife notified per wife she states he did have 3 separate urine test done. Pt wife states she will go by [facility] today and get all of those record to send to office herself.

## 2019-11-25 NOTE — Telephone Encounter (Signed)
-----   Message from Marcine Matar, MD sent at 11/24/2019  4:57 PM EST ----- Please call wife. I received urine results from [facility]--only test was in Feb of this year. Urinalysis as well as urine cultures were both normal--no UTI.  They did not seem to have 3 separate urines from Nov til now as pt's wife described.. Anyway, he does not need to be on any meds for this ----- Message ----- From: Peter Congo Sent: 11/24/2019   2:27 PM EST To: Marcine Matar, MD  This is the urinalysis that [facility] sent over.

## 2019-11-26 LAB — URINE CULTURE: Culture: 80000 — AB

## 2019-11-27 DIAGNOSIS — I10 Essential (primary) hypertension: Secondary | ICD-10-CM | POA: Diagnosis not present

## 2019-11-27 DIAGNOSIS — I82503 Chronic embolism and thrombosis of unspecified deep veins of lower extremity, bilateral: Secondary | ICD-10-CM | POA: Diagnosis not present

## 2019-11-28 ENCOUNTER — Other Ambulatory Visit: Payer: Self-pay | Admitting: Urology

## 2019-11-28 DIAGNOSIS — N3000 Acute cystitis without hematuria: Secondary | ICD-10-CM

## 2019-11-28 MED ORDER — SULFAMETHOXAZOLE-TRIMETHOPRIM 800-160 MG PO TABS: 1.0000 | ORAL_TABLET | Freq: Two times a day (BID) | ORAL | 0 refills | Status: DC

## 2019-11-30 ENCOUNTER — Telehealth: Payer: Self-pay

## 2019-11-30 NOTE — Telephone Encounter (Signed)
-----   Message from Marcine Matar, MD sent at 11/28/2019  7:23 AM EST ----- I have sent sulfa in for + culture. Ask pt to have INR checked after treatment ----- Message ----- From: Ferdinand Lango, RN Sent: 11/27/2019   2:18 PM EST To: Marcine Matar, MD  Urine culture

## 2019-11-30 NOTE — Telephone Encounter (Signed)
Pt wife called and notified. Pt has INR appt next week per wife.

## 2019-12-03 DIAGNOSIS — Z6831 Body mass index (BMI) 31.0-31.9, adult: Secondary | ICD-10-CM | POA: Diagnosis not present

## 2019-12-03 DIAGNOSIS — N3090 Cystitis, unspecified without hematuria: Secondary | ICD-10-CM | POA: Diagnosis not present

## 2019-12-03 DIAGNOSIS — I82503 Chronic embolism and thrombosis of unspecified deep veins of lower extremity, bilateral: Secondary | ICD-10-CM | POA: Diagnosis not present

## 2019-12-03 DIAGNOSIS — R531 Weakness: Secondary | ICD-10-CM | POA: Diagnosis not present

## 2019-12-09 DIAGNOSIS — I82503 Chronic embolism and thrombosis of unspecified deep veins of lower extremity, bilateral: Secondary | ICD-10-CM | POA: Diagnosis not present

## 2019-12-22 ENCOUNTER — Ambulatory Visit: Payer: Medicare HMO

## 2019-12-22 ENCOUNTER — Other Ambulatory Visit (HOSPITAL_COMMUNITY)
Admission: AD | Admit: 2019-12-22 | Discharge: 2019-12-22 | Disposition: A | Discharge status: Home / Self Care | Payer: Medicare HMO | Source: Skilled Nursing Facility | Attending: Urology | Admitting: Urology

## 2019-12-22 ENCOUNTER — Other Ambulatory Visit: Payer: Self-pay

## 2019-12-22 DIAGNOSIS — N390 Urinary tract infection, site not specified: Secondary | ICD-10-CM | POA: Diagnosis not present

## 2019-12-22 DIAGNOSIS — N3000 Acute cystitis without hematuria: Secondary | ICD-10-CM

## 2019-12-25 ENCOUNTER — Other Ambulatory Visit: Payer: Self-pay

## 2019-12-25 ENCOUNTER — Other Ambulatory Visit: Payer: Self-pay | Admitting: Urology

## 2019-12-25 ENCOUNTER — Telehealth: Payer: Self-pay | Admitting: Urology

## 2019-12-25 DIAGNOSIS — N3000 Acute cystitis without hematuria: Secondary | ICD-10-CM

## 2019-12-25 LAB — URINE CULTURE: Culture: >=100000 — AB

## 2019-12-25 MED ORDER — CIPROFLOXACIN HCL 250 MG PO TABS: 250.0000 mg | ORAL_TABLET | Freq: Two times a day (BID) | ORAL | 0 refills | Status: DC

## 2019-12-25 NOTE — Progress Notes (Signed)
Pt. Dropped off urine sample. Sent to lab.

## 2019-12-25 NOTE — Telephone Encounter (Signed)
Please see culture. Report is back.

## 2019-12-25 NOTE — Telephone Encounter (Signed)
Patient called the office this morning requesting urine culture results.  He is getting worse and "not doing well at all".  He has increased frequency, burning and incontinence.    Please contact patient with culture results and prescription information.

## 2019-12-25 NOTE — Telephone Encounter (Signed)
I called and spoke with the wife. Wife reports she brought a urine sample by the office this past Tuesday 3/23 pt was having urinary symptoms. Wife informed of the antibiotic that was sent in. Wife requested pt be seen in two weeks. appt scheduled. Wife informed to have INR checked as well. Voiced understanding.

## 2019-12-25 NOTE — Telephone Encounter (Signed)
-----   Message from Marcine Matar, MD sent at 12/25/2019 11:08 AM EDT -----  So, according to our conversation I do not remember ordering or can see where I ordered urine culture on this man.  I sent in Cipro.  He is allergic to sulfa which is the only other oral antibiotic we can use.  He needs to have his INR checked after he completes the course of Cipro. ----- Message ----- From: Ferdinand Lango, RN Sent: 12/25/2019   9:29 AM EDT To: Marcine Matar, MD  Urine culture

## 2019-12-25 NOTE — Addendum Note (Signed)
Addended by: Gustavus Messing on: 12/25/2019 12:43 PM   Modules accepted: Orders

## 2019-12-29 DIAGNOSIS — I4891 Unspecified atrial fibrillation: Secondary | ICD-10-CM | POA: Diagnosis not present

## 2019-12-30 DIAGNOSIS — I10 Essential (primary) hypertension: Secondary | ICD-10-CM | POA: Diagnosis not present

## 2019-12-30 DIAGNOSIS — K219 Gastro-esophageal reflux disease without esophagitis: Secondary | ICD-10-CM | POA: Diagnosis not present

## 2020-01-05 ENCOUNTER — Telehealth: Payer: Self-pay

## 2020-01-05 ENCOUNTER — Other Ambulatory Visit: Payer: Self-pay

## 2020-01-05 ENCOUNTER — Other Ambulatory Visit (HOSPITAL_COMMUNITY)
Admission: RE | Admit: 2020-01-05 | Discharge: 2020-01-05 | Disposition: A | Discharge status: Home / Self Care | Payer: Medicare HMO | Source: Ambulatory Visit | Attending: Urology | Admitting: Urology

## 2020-01-05 ENCOUNTER — Ambulatory Visit: Payer: Medicare HMO | Admitting: Urology

## 2020-01-05 ENCOUNTER — Encounter: Payer: Self-pay | Admitting: Urology

## 2020-01-05 ENCOUNTER — Other Ambulatory Visit: Payer: Self-pay | Admitting: Urology

## 2020-01-05 VITALS — BP 166/76 | HR 78 | Temp 97.3°F | Ht 68.0 in | Wt 212.0 lb

## 2020-01-05 DIAGNOSIS — N390 Urinary tract infection, site not specified: Secondary | ICD-10-CM

## 2020-01-05 DIAGNOSIS — N3000 Acute cystitis without hematuria: Secondary | ICD-10-CM

## 2020-01-05 LAB — BLADDER SCAN AMB NON-IMAGING: Scan Result: 62.8

## 2020-01-05 LAB — POCT URINALYSIS DIPSTICK
Bilirubin, UA: NEGATIVE
Glucose, UA: NEGATIVE
Ketones, UA: NEGATIVE
Nitrite, UA: NEGATIVE
Protein, UA: POSITIVE — AB
Spec Grav, UA: <=1.005 — AB (ref 1.010–1.025)
Urobilinogen, UA: 0.2 E.U./dL
pH, UA: 6 (ref 5.0–8.0)

## 2020-01-05 MED ORDER — METHENAMINE MANDELATE 1 G PO TABS: 1000.0000 mg | ORAL_TABLET | Freq: Two times a day (BID) | ORAL | 3 refills | Status: DC

## 2020-01-05 MED ORDER — METHENAMINE MANDELATE 1 G PO TABS: 1000.0000 mg | ORAL_TABLET | Freq: Two times a day (BID) | ORAL | 11 refills | Status: DC

## 2020-01-05 MED ORDER — METHENAMINE HIPPURATE 1 G PO TABS: 1.0000 g | ORAL_TABLET | Freq: Two times a day (BID) | ORAL | 3 refills | Status: DC

## 2020-01-05 NOTE — Progress Notes (Signed)
H&P  Chief Complaint: Acute Cystitis  History of Present Illness:   4.6.2021: Jose Bush returns today for work-in still complaining of what Jose Bush feels to be a persistent infection. Jose Bush is still not experiencing any urinary sx's but has begun to "lose [his] strength" again. Jose Bush describes this as general fatigue that is so severe at times that Jose Bush is unable to transfer himself from a chair. Jose Bush denies having had any cloudy or foul-smelling urine. These sx's apparently improve with abx.   Past Medical History:  Diagnosis Date  . Arthritis   . Chest pain, unspecified   . DVT (deep venous thrombosis) (HCC)   . GERD (gastroesophageal reflux disease)   . Heart murmur    hx of   . History of hiatal hernia   . HTN (hypertension)   . Lupus (HCC)   . Lupus (HCC)   . Peripheral vascular disease (HCC)    nonviable tissue   . Shingles    hx of   . Stroke [facility]) 2010   'MILD" per pt-  weakness left side  . Tinnitus   . Ulcer of abdomen wall (HCC)   . Wears dentures   . Wears glasses     Past Surgical History:  Procedure Laterality Date  . ABDOMINAL AORTOGRAM W/LOWER EXTREMITY N/A 02/17/2018   Procedure: ABDOMINAL AORTOGRAM W/LOWER EXTREMITY;  Surgeon: Maeola Harman, MD;  Location: [facility] INVASIVE CV LAB;  Service: Cardiovascular;  Laterality: N/A;  . AMPUTATION Right 03/03/2018   Procedure: AMPUTATION RIGHT SECOND TOE;  Surgeon: Sherren Kerns, MD;  Location: [facility] OR;  Service: Vascular;  Laterality: Right;  . AMPUTATION Right 03/18/2018   Procedure: AMPUTATION BELOW KNEE;  Surgeon: Sherren Kerns, MD;  Location: [facility] OR;  Service: Vascular;  Laterality: Right;  . BELOW KNEE LEG AMPUTATION Right 03/18/2018  . bilateral cataract surgery     . BIOPSY N/A 10/22/2017   Procedure: BIOPSY;  Surgeon: Franky Macho, MD;  [facility];  Service: Gastroenterology;  Laterality: N/A;  . COLONOSCOPY N/A 09/18/2016   Dr. Lovell Sheehan: normal  . ESOPHAGOGASTRODUODENOSCOPY N/A 10/22/2017   Dr. Lovell Sheehan:  normal duodenum, gastric mucosal atrophy but negative H.pylori (Clotest negative), normal GE junction  . GIVENS CAPSULE STUDY N/A 01/27/2018   Procedure: GIVENS CAPSULE STUDY;  Surgeon: Corbin Ade, MD;  Location: [facility];  Service: Endoscopy;  Laterality: N/A;  7:00am  . HERNIA REPAIR     2010/laparoscopic/right  . ivc filter     01/2013   . KNEE ARTHROSCOPY Right    2007  . MULTIPLE TOOTH EXTRACTIONS    . PERIPHERAL VASCULAR BALLOON ANGIOPLASTY  02/17/2018   Procedure: PERIPHERAL VASCULAR BALLOON ANGIOPLASTY;  Surgeon: Maeola Harman, MD;  Location: [facility] INVASIVE CV LAB;  Service: Cardiovascular;;  . TOTAL HIP ARTHROPLASTY Right 05/01/2017   Procedure: RIGHT TOTAL HIP ARTHROPLASTY ANTERIOR APPROACH;  Surgeon: Ollen Gross, MD;  Location: [facility] ORS;  Service: Orthopedics;  Laterality: Right;  . TOTAL KNEE ARTHROPLASTY Left 12/13/2014   Procedure: LEFT TOTAL KNEE ARTHROPLASTY;  Surgeon: Ollen Gross, MD;  Location: [facility] ORS;  Service: Orthopedics;  Laterality: Left;  . TOTAL KNEE ARTHROPLASTY Right 03/21/2015   Procedure: RIGHT TOTAL KNEE ARTHROPLASTY;  Surgeon: Ollen Gross, MD;  Location: [facility] ORS;  Service: Orthopedics;  Laterality: Right;    Home Medications:  Allergies as of 01/05/2020      Reactions   Sulfa Antibiotics       Medication List       Accurate as of  January 05, 2020  9:50 AM. If you have any questions, ask your nurse or doctor.        acetaminophen 500 MG tablet Commonly known as: TYLENOL Take 500 mg by mouth every 6 (six) hours as needed for moderate pain or headache.   ciprofloxacin 250 MG tablet Commonly known as: Cipro Take 1 tablet (250 mg total) by mouth 2 (two) times daily.   docusate sodium 100 MG capsule Commonly known as: COLACE Take 100 mg by mouth daily.   ESTER C PO Take 500 mg by mouth daily.   feeding supplement (ENSURE ENLIVE) Liqd Take 237 mLs by mouth 2 (two) times daily between meals.   lactose free nutrition Liqd Take 237  mLs by mouth 3 (three) times daily with meals.   finasteride 5 MG tablet Commonly known as: PROSCAR   hydroxychloroquine 200 MG tablet Commonly known as: PLAQUENIL Take 200 mg by mouth 2 (two) times daily.   ICY HOT EX Apply 1 application topically 2 (two) times daily as needed (left knee.).   lisinopril-hydrochlorothiazide 10-12.5 MG tablet Commonly known as: ZESTORETIC Take 1 tablet by mouth daily with breakfast.   multivitamin with minerals Tabs tablet Take 1 tablet by mouth daily. Centrum Silver   OMEGA-3 KRILL OIL PO Take 1 capsule by mouth daily.   omeprazole 20 MG capsule Commonly known as: PRILOSEC Take 20 mg by mouth daily.   predniSONE 5 MG tablet Commonly known as: DELTASONE   sulfamethoxazole-trimethoprim 800-160 MG tablet Commonly known as: BACTRIM DS Take 1 tablet by mouth 2 (two) times daily.   tamsulosin 0.4 MG Caps capsule Commonly known as: FLOMAX Take 2 capsules (0.8 mg total) by mouth daily.   URINOZINC PO Take 1 capsule by mouth 2 (two) times daily.   warfarin 5 MG tablet Commonly known as: COUMADIN Take 1 tablet (5 mg total) by mouth every morning. Take Coumadin for three weeks for postoperative protocol and then the patient may resume their previous Coumadin home regimen.  The dose may need to be adjusted based upon the INR.  Please follow the INR and titrate Coumadin dose for a therapeutic range between 2.0 and 3.0 INR.  After completing the three weeks of Coumadin, the patient may resume their previous Coumadin home regimen. What changed:   when to take this  additional instructions       Allergies:  Allergies  Allergen Reactions  . Sulfa Antibiotics     Family History  Problem Relation Age of Onset  . Hypertension Other   . Stroke Mother   . Colon cancer Neg Hx     Social History:  reports that Jose Bush quit smoking about 36 years ago. His smoking use included cigarettes. Jose Bush has a 20.00 pack-year smoking history. Jose Bush has never used  smokeless tobacco. Jose Bush reports that Jose Bush does not drink alcohol or use drugs.  ROS: A complete review of systems was performed.  All systems are negative except for pertinent findings as noted.  Physical Exam:  Vital signs in last 24 hours: BP (!) 166/76   Pulse 78   Temp (!) 97.3 F (36.3 C)   Ht 5\' 8"  (1.727 m)   Wt 212 lb (96.2 kg)   BMI 32.23 kg/m  Constitutional:  Alert and oriented, No acute distress Cardiovascular: Regular rate  Respiratory: Normal respiratory effort GI: Abdomen is soft, nontender, nondistended, no abdominal masses. No CVAT.  Genitourinary: Not completed. Lymphatic: No lymphadenopathy Neurologic: Grossly intact, no focal deficits Psychiatric: Normal mood and affect  I have reviewed prior pt notes  I have reviewed notes from referring/previous physicians  I have reviewed urinalysis results  I have independently reviewed prior imaging  I have reviewed prior urine culture Impression/Assessment:  Jose Bush was unable to give a urine sample today. His past UA's have only really indicated a benign colonization process. Prior to first being seen here for these sx's Jose Bush has only had negative urine cultures. I do not think that his weakness is secondary to a urinary tract infection or any other urological pathology. Despite not voiding in office his PVR today is quite low at 62.8 mL (this is down from last visit).  Plan:  1. Jose Bush is fine to drop off a urine sample this week -- we will forward these results and follow-up if needed.  2. We again discussed that it seems that most of the time the bacteria in his urine seems to be secondary to benign colonization.   3. Start on methenamine.   4. If Jose Bush is not already, Jose Bush may try to taper down to 1x tamsulosin daily -- Jose Bush was advised that a higher dose may be producing some weakness/orthostasis. (Jose Bush does think that Jose Bush is already on just one daily but is not sure)  5. Return in 4 mo for OV.

## 2020-01-05 NOTE — Telephone Encounter (Signed)
-----   Message from Marcine Matar, MD sent at 01/05/2020 12:21 PM EDT ----- Regarding: u/a Notify pt some wbc in urine--culture sent--cont plan for methenamine as discussed for now  I ordered culture

## 2020-01-05 NOTE — Telephone Encounter (Signed)
You can tell her it is around 37 dollars a month @ [facility], [facility], [employer]

## 2020-01-05 NOTE — Telephone Encounter (Signed)
Pts wife notified of cx. Let her know med was sent in. She was going to call different pharmacies about price and then call us back.

## 2020-01-05 NOTE — Addendum Note (Signed)
Addended by: Gustavus Messing on: 01/05/2020 11:42 AM   Modules accepted: Orders

## 2020-01-05 NOTE — Progress Notes (Signed)
Urological Symptom Review  Patient is experiencing the following symptoms: Frequent urination Hard to postpone urination Get up at night to urinate Leakage of urine Stream starts and stops Trouble starting stream Have to strain to urinate Urinary tract infection Weak stream   Review of Systems  Gastrointestinal (upper)  : Negative for upper GI symptoms  Gastrointestinal (lower) : Negative for lower GI symptoms  Constitutional : Fatigue  Skin: Negative for skin symptoms  Eyes: Negative for eye symptoms  Ear/Nose/Throat : Negative for Ear/Nose/Throat symptoms  Hematologic/Lymphatic: Negative for Hematologic/Lymphatic symptoms  Cardiovascular : Negative for cardiovascular symptoms  Respiratory : Negative for respiratory symptoms  Endocrine: Negative for endocrine symptoms  Musculoskeletal: Negative for musculoskeletal symptoms  Neurological: Negative for neurological symptoms  Psychologic: Negative for psychiatric symptoms

## 2020-01-06 NOTE — Telephone Encounter (Signed)
Based on the cx result would that change the med?

## 2020-01-07 LAB — URINE CULTURE: Culture: >=100000 — AB

## 2020-01-08 ENCOUNTER — Other Ambulatory Visit: Payer: Self-pay

## 2020-01-08 DIAGNOSIS — N390 Urinary tract infection, site not specified: Secondary | ICD-10-CM

## 2020-01-08 MED ORDER — METHENAMINE HIPPURATE 1 G PO TABS: 1.0000 g | ORAL_TABLET | Freq: Two times a day (BID) | ORAL | 3 refills | Status: DC

## 2020-01-08 NOTE — Telephone Encounter (Signed)
Spoke with pts wife. Explained Dr. Retta Diones had not looked at cx result yet. Told her it was sent at about 12 today. Notified wife also that Dr. Retta Diones said could get drug cheaper at a [facility], [facility] or [facility] store. [facility] [place] New rx was sent there.

## 2020-01-08 NOTE — Telephone Encounter (Signed)
Pt called this morning and asked for the results of cx. Wishes for Stanton Kidney to call back

## 2020-01-11 ENCOUNTER — Telehealth: Payer: Self-pay | Admitting: Urology

## 2020-01-11 NOTE — Telephone Encounter (Signed)
Talbert Forest called and lm requesting urine culture results.

## 2020-01-12 NOTE — Telephone Encounter (Signed)
Notify patient/wife that the same bacteria is present.  Does not look like antibiotics have been effective in keeping this away.  I would definitely start the methenamine as I prescribed.

## 2020-01-12 NOTE — Telephone Encounter (Signed)
Pt wife asking for urine culture results.

## 2020-01-13 NOTE — Telephone Encounter (Signed)
Spoke with pts wife. She said pt seems to think med is helping. She will call back if there are more problems.

## 2020-01-15 DIAGNOSIS — H53003 Unspecified amblyopia, bilateral: Secondary | ICD-10-CM | POA: Diagnosis not present

## 2020-01-15 DIAGNOSIS — H52209 Unspecified astigmatism, unspecified eye: Secondary | ICD-10-CM | POA: Diagnosis not present

## 2020-01-15 DIAGNOSIS — Z961 Presence of intraocular lens: Secondary | ICD-10-CM | POA: Diagnosis not present

## 2020-01-15 DIAGNOSIS — H353130 Nonexudative age-related macular degeneration, bilateral, stage unspecified: Secondary | ICD-10-CM | POA: Diagnosis not present

## 2020-01-15 DIAGNOSIS — Z0100 Encounter for examination of eyes and vision without abnormal findings: Secondary | ICD-10-CM | POA: Diagnosis not present

## 2020-01-15 DIAGNOSIS — H43813 Vitreous degeneration, bilateral: Secondary | ICD-10-CM | POA: Diagnosis not present

## 2020-01-26 DIAGNOSIS — I82503 Chronic embolism and thrombosis of unspecified deep veins of lower extremity, bilateral: Secondary | ICD-10-CM | POA: Diagnosis not present

## 2020-01-29 ENCOUNTER — Ambulatory Visit: Payer: Medicare HMO | Admitting: Urology

## 2020-01-29 DIAGNOSIS — K219 Gastro-esophageal reflux disease without esophagitis: Secondary | ICD-10-CM | POA: Diagnosis not present

## 2020-01-29 DIAGNOSIS — I10 Essential (primary) hypertension: Secondary | ICD-10-CM | POA: Diagnosis not present

## 2020-01-29 DIAGNOSIS — N400 Enlarged prostate without lower urinary tract symptoms: Secondary | ICD-10-CM | POA: Diagnosis not present

## 2020-02-09 DIAGNOSIS — I4891 Unspecified atrial fibrillation: Secondary | ICD-10-CM | POA: Diagnosis not present

## 2020-02-22 DIAGNOSIS — Z9841 Cataract extraction status, right eye: Secondary | ICD-10-CM | POA: Diagnosis not present

## 2020-02-22 DIAGNOSIS — E669 Obesity, unspecified: Secondary | ICD-10-CM | POA: Diagnosis not present

## 2020-02-22 DIAGNOSIS — K219 Gastro-esophageal reflux disease without esophagitis: Secondary | ICD-10-CM | POA: Diagnosis not present

## 2020-02-22 DIAGNOSIS — I82509 Chronic embolism and thrombosis of unspecified deep veins of unspecified lower extremity: Secondary | ICD-10-CM | POA: Diagnosis not present

## 2020-02-22 DIAGNOSIS — I69354 Hemiplegia and hemiparesis following cerebral infarction affecting left non-dominant side: Secondary | ICD-10-CM | POA: Diagnosis not present

## 2020-02-22 DIAGNOSIS — Z9842 Cataract extraction status, left eye: Secondary | ICD-10-CM | POA: Diagnosis not present

## 2020-02-22 DIAGNOSIS — I10 Essential (primary) hypertension: Secondary | ICD-10-CM | POA: Diagnosis not present

## 2020-02-22 DIAGNOSIS — N400 Enlarged prostate without lower urinary tract symptoms: Secondary | ICD-10-CM | POA: Diagnosis not present

## 2020-02-22 DIAGNOSIS — M1990 Unspecified osteoarthritis, unspecified site: Secondary | ICD-10-CM | POA: Diagnosis not present

## 2020-02-22 DIAGNOSIS — H547 Unspecified visual loss: Secondary | ICD-10-CM | POA: Diagnosis not present

## 2020-02-22 DIAGNOSIS — Z87891 Personal history of nicotine dependence: Secondary | ICD-10-CM | POA: Diagnosis not present

## 2020-02-22 DIAGNOSIS — M329 Systemic lupus erythematosus, unspecified: Secondary | ICD-10-CM | POA: Diagnosis not present

## 2020-02-22 DIAGNOSIS — Z96641 Presence of right artificial hip joint: Secondary | ICD-10-CM | POA: Diagnosis not present

## 2020-02-22 DIAGNOSIS — Z96653 Presence of artificial knee joint, bilateral: Secondary | ICD-10-CM | POA: Diagnosis not present

## 2020-02-22 DIAGNOSIS — Z89511 Acquired absence of right leg below knee: Secondary | ICD-10-CM | POA: Diagnosis not present

## 2020-02-23 ENCOUNTER — Ambulatory Visit: Payer: Medicare HMO | Admitting: Urology

## 2020-02-26 ENCOUNTER — Other Ambulatory Visit: Payer: Self-pay

## 2020-02-26 ENCOUNTER — Other Ambulatory Visit (HOSPITAL_COMMUNITY)
Admission: AD | Admit: 2020-02-26 | Discharge: 2020-02-26 | Disposition: A | Discharge status: Home / Self Care | Payer: Medicare HMO | Source: Skilled Nursing Facility | Attending: Urology | Admitting: Urology

## 2020-02-26 DIAGNOSIS — R339 Retention of urine, unspecified: Secondary | ICD-10-CM | POA: Diagnosis not present

## 2020-02-29 LAB — URINE CULTURE: Culture: >=100000 — AB

## 2020-03-07 ENCOUNTER — Telehealth: Payer: Self-pay | Admitting: Urology

## 2020-03-07 NOTE — Telephone Encounter (Signed)
Pts wife called asking for urine culture results.

## 2020-03-11 DIAGNOSIS — I4891 Unspecified atrial fibrillation: Secondary | ICD-10-CM | POA: Diagnosis not present

## 2020-03-11 DIAGNOSIS — I82499 Acute embolism and thrombosis of other specified deep vein of unspecified lower extremity: Secondary | ICD-10-CM | POA: Diagnosis not present

## 2020-03-14 NOTE — Telephone Encounter (Signed)
Sent results to MD to review

## 2020-03-16 NOTE — Telephone Encounter (Signed)
Pt is asking for urine culture results

## 2020-03-22 ENCOUNTER — Telehealth: Payer: Self-pay

## 2020-03-22 NOTE — Telephone Encounter (Signed)
Wife called and made aware of urine culture results.

## 2020-03-22 NOTE — Telephone Encounter (Signed)
-----   Message from Marcine Matar, MD sent at 03/21/2020  8:59 PM EDT ----- Notify wife--same bacteria which he seems to be colonized with. I think he has sx's unrelated to bacteria.  I would not suggest abx unless he is sick. She seems to call a lot. I would suggest that we demand OV every time she has a c/o so we can see him. ----- Message ----- From: Ferdinand Lango, RN Sent: 03/14/2020   9:44 AM EDT To: Marcine Matar, MD  Urine culture result- looks like from a drop off

## 2020-03-30 DIAGNOSIS — M328 Other forms of systemic lupus erythematosus: Secondary | ICD-10-CM | POA: Diagnosis not present

## 2020-03-30 DIAGNOSIS — I70229 Atherosclerosis of native arteries of extremities with rest pain, unspecified extremity: Secondary | ICD-10-CM | POA: Diagnosis not present

## 2020-03-30 DIAGNOSIS — D6861 Antiphospholipid syndrome: Secondary | ICD-10-CM | POA: Diagnosis not present

## 2020-03-30 DIAGNOSIS — M2550 Pain in unspecified joint: Secondary | ICD-10-CM | POA: Diagnosis not present

## 2020-03-30 DIAGNOSIS — M150 Primary generalized (osteo)arthritis: Secondary | ICD-10-CM | POA: Diagnosis not present

## 2020-03-30 DIAGNOSIS — R29898 Other symptoms and signs involving the musculoskeletal system: Secondary | ICD-10-CM | POA: Diagnosis not present

## 2020-03-30 DIAGNOSIS — Z87891 Personal history of nicotine dependence: Secondary | ICD-10-CM | POA: Diagnosis not present

## 2020-03-30 DIAGNOSIS — M329 Systemic lupus erythematosus, unspecified: Secondary | ICD-10-CM | POA: Diagnosis not present

## 2020-03-30 DIAGNOSIS — I10 Essential (primary) hypertension: Secondary | ICD-10-CM | POA: Diagnosis not present

## 2020-03-30 DIAGNOSIS — Z79899 Other long term (current) drug therapy: Secondary | ICD-10-CM | POA: Diagnosis not present

## 2020-03-30 DIAGNOSIS — R42 Dizziness and giddiness: Secondary | ICD-10-CM | POA: Diagnosis not present

## 2020-03-30 DIAGNOSIS — R5382 Chronic fatigue, unspecified: Secondary | ICD-10-CM | POA: Diagnosis not present

## 2020-03-30 DIAGNOSIS — R011 Cardiac murmur, unspecified: Secondary | ICD-10-CM | POA: Diagnosis not present

## 2020-04-07 DIAGNOSIS — R011 Cardiac murmur, unspecified: Secondary | ICD-10-CM | POA: Diagnosis not present

## 2020-04-07 DIAGNOSIS — Z6832 Body mass index (BMI) 32.0-32.9, adult: Secondary | ICD-10-CM | POA: Diagnosis not present

## 2020-04-07 DIAGNOSIS — I82499 Acute embolism and thrombosis of other specified deep vein of unspecified lower extremity: Secondary | ICD-10-CM | POA: Diagnosis not present

## 2020-04-12 ENCOUNTER — Encounter: Payer: Self-pay | Admitting: *Deleted

## 2020-04-19 ENCOUNTER — Ambulatory Visit: Payer: Medicare HMO | Admitting: Cardiology

## 2020-04-21 ENCOUNTER — Other Ambulatory Visit: Payer: Self-pay

## 2020-04-21 DIAGNOSIS — I739 Peripheral vascular disease, unspecified: Secondary | ICD-10-CM

## 2020-04-29 DIAGNOSIS — Z87891 Personal history of nicotine dependence: Secondary | ICD-10-CM | POA: Diagnosis not present

## 2020-04-29 DIAGNOSIS — I70229 Atherosclerosis of native arteries of extremities with rest pain, unspecified extremity: Secondary | ICD-10-CM | POA: Diagnosis not present

## 2020-04-29 DIAGNOSIS — M328 Other forms of systemic lupus erythematosus: Secondary | ICD-10-CM | POA: Diagnosis not present

## 2020-04-29 DIAGNOSIS — I10 Essential (primary) hypertension: Secondary | ICD-10-CM | POA: Diagnosis not present

## 2020-05-05 ENCOUNTER — Encounter: Payer: Self-pay | Admitting: Vascular Surgery

## 2020-05-05 ENCOUNTER — Ambulatory Visit (HOSPITAL_COMMUNITY)
Admission: RE | Admit: 2020-05-05 | Discharge: 2020-05-05 | Disposition: A | Discharge status: Home / Self Care | Payer: Medicare HMO | Source: Ambulatory Visit | Attending: Vascular Surgery | Admitting: Vascular Surgery

## 2020-05-05 ENCOUNTER — Encounter (HOSPITAL_COMMUNITY): Payer: Medicare HMO

## 2020-05-05 ENCOUNTER — Ambulatory Visit: Payer: Medicare HMO | Admitting: Vascular Surgery

## 2020-05-05 ENCOUNTER — Other Ambulatory Visit: Payer: Self-pay

## 2020-05-05 ENCOUNTER — Ambulatory Visit (INDEPENDENT_AMBULATORY_CARE_PROVIDER_SITE_OTHER): Payer: Medicare HMO | Admitting: Vascular Surgery

## 2020-05-05 VITALS — BP 143/78 | HR 67 | Temp 97.9°F | Resp 20 | Ht 68.0 in | Wt 212.0 lb

## 2020-05-05 DIAGNOSIS — I739 Peripheral vascular disease, unspecified: Secondary | ICD-10-CM

## 2020-05-05 NOTE — Progress Notes (Signed)
Patient is a 79 year old male who returns for follow-up today.  He underwent a right below-knee amputation in 2019.  This was after revascularization with a tibial angioplasty.  He has known tibial disease bilaterally.  He has a history of lupus.  He currently does not have rest pain in the left foot.  However, he thinks his left leg is weaker than it used to be.  He is walking some on her right prosthesis with a cane.  He does not have any nonhealing wounds on the left foot.  He has recently developed a callus over the tibial area of his right below-knee stump.  He does not have an open wound.  He is on warfarin.  Current Outpatient Medications on File Prior to Visit  Medication Sig Dispense Refill  . acetaminophen (TYLENOL) 500 MG tablet Take 500 mg by mouth every 6 (six) hours as needed for moderate pain or headache.     Marland Kitchen Bioflavonoid Products (ESTER C PO) Take 500 mg by mouth daily.    . ciprofloxacin (CIPRO) 250 MG tablet Take 1 tablet (250 mg total) by mouth 2 (two) times daily. 14 tablet 0  . docusate sodium (COLACE) 100 MG capsule Take 100 mg by mouth daily.     . feeding supplement, ENSURE ENLIVE, (ENSURE ENLIVE) LIQD Take 237 mLs by mouth 2 (two) times daily between meals. 237 mL 12  . finasteride (PROSCAR) 5 MG tablet     . hydroxychloroquine (PLAQUENIL) 200 MG tablet Take 200 mg by mouth 2 (two) times daily.    Marland Kitchen lactose free nutrition (BOOST PLUS) LIQD Take 237 mLs by mouth 3 (three) times daily with meals.  0  . lisinopril-hydrochlorothiazide (PRINZIDE,ZESTORETIC) 10-12.5 MG per tablet Take 1 tablet by mouth daily with breakfast.     . Menthol, Topical Analgesic, (ICY HOT EX) Apply 1 application topically 2 (two) times daily as needed (left knee.).     Marland Kitchen methenamine (HIPREX) 1 g tablet Take 1 tablet (1 g total) by mouth 2 (two) times daily with a meal. 180 tablet 3  . Misc Natural Products (URINOZINC PO) Take 1 capsule by mouth 2 (two) times daily.     . Multiple Vitamin (MULTIVITAMIN  WITH MINERALS) TABS tablet Take 1 tablet by mouth daily. Centrum Silver    . OMEGA-3 KRILL OIL PO Take 1 capsule by mouth daily.    Marland Kitchen omeprazole (PRILOSEC) 20 MG capsule Take 20 mg by mouth daily.    . predniSONE (DELTASONE) 5 MG tablet     . tamsulosin (FLOMAX) 0.4 MG CAPS capsule Take 2 capsules (0.8 mg total) by mouth daily. 60 capsule 0  . warfarin (COUMADIN) 5 MG tablet Take 1 tablet (5 mg total) by mouth every morning. Take Coumadin for three weeks for postoperative protocol and then the patient may resume their previous Coumadin home regimen.  The dose may need to be adjusted based upon the INR.  Please follow the INR and titrate Coumadin dose for a therapeutic range between 2.0 and 3.0 INR.  After completing the three weeks of Coumadin, the patient may resume their previous Coumadin home regimen. (Patient taking differently: Take 5 mg by mouth daily. ) 35 tablet 0   No current facility-administered medications on file prior to visit.     Past Medical History:  Diagnosis Date  . Arthritis   . Chest pain, unspecified   . DVT (deep venous thrombosis) (HCC)   . GERD (gastroesophageal reflux disease)   . Heart murmur  hx of   . History of hiatal hernia   . HTN (hypertension)   . Lupus (HCC)   . Lupus (HCC)   . Peripheral vascular disease (HCC)    nonviable tissue   . Shingles    hx of   . Stroke [facility]) 2010   'MILD" per pt-  weakness left side  . Tinnitus   . Ulcer of abdomen wall (HCC)   . Wears dentures   . Wears glasses    Past Surgical History:  Procedure Laterality Date  . ABDOMINAL AORTOGRAM W/LOWER EXTREMITY N/A 02/17/2018   Procedure: ABDOMINAL AORTOGRAM W/LOWER EXTREMITY;  Surgeon: Maeola Harman, MD;  Location: [facility] INVASIVE CV LAB;  Service: Cardiovascular;  Laterality: N/A;  . AMPUTATION Right 03/03/2018   Procedure: AMPUTATION RIGHT SECOND TOE;  Surgeon: Sherren Kerns, MD;  Location: [facility] OR;  Service: Vascular;  Laterality: Right;  . AMPUTATION Right  03/18/2018   Procedure: AMPUTATION BELOW KNEE;  Surgeon: Sherren Kerns, MD;  Location: [facility] OR;  Service: Vascular;  Laterality: Right;  . BELOW KNEE LEG AMPUTATION Right 03/18/2018  . bilateral cataract surgery     . BIOPSY N/A 10/22/2017   Procedure: BIOPSY;  Surgeon: Franky Macho, MD;  [facility];  Service: Gastroenterology;  Laterality: N/A;  . CATARACT EXTRACTION Bilateral 09/20/2016  . COLONOSCOPY N/A 09/18/2016   Dr. Lovell Sheehan: normal  . ESOPHAGOGASTRODUODENOSCOPY N/A 10/22/2017   Dr. Lovell Sheehan: normal duodenum, gastric mucosal atrophy but negative H.pylori (Clotest negative), normal GE junction  . GIVENS CAPSULE STUDY N/A 01/27/2018   Procedure: GIVENS CAPSULE STUDY;  Surgeon: Corbin Ade, MD;  [facility];  Service: Endoscopy;  Laterality: N/A;  7:00am  . HERNIA REPAIR     2010/laparoscopic/right  . ivc filter     01/2013   . KNEE ARTHROSCOPY Right    2007  . MULTIPLE TOOTH EXTRACTIONS    . PERIPHERAL VASCULAR BALLOON ANGIOPLASTY  02/17/2018   Procedure: PERIPHERAL VASCULAR BALLOON ANGIOPLASTY;  Surgeon: Maeola Harman, MD;  Location: [facility] INVASIVE CV LAB;  Service: Cardiovascular;;  . TOTAL HIP ARTHROPLASTY Right 05/01/2017   Procedure: RIGHT TOTAL HIP ARTHROPLASTY ANTERIOR APPROACH;  Surgeon: Ollen Gross, MD;  Location: [facility];  Service: Orthopedics;  Laterality: Right;  . TOTAL KNEE ARTHROPLASTY Left 12/13/2014   Procedure: LEFT TOTAL KNEE ARTHROPLASTY;  Surgeon: Ollen Gross, MD;  Location: [facility];  Service: Orthopedics;  Laterality: Left;  . TOTAL KNEE ARTHROPLASTY Right 03/21/2015   Procedure: RIGHT TOTAL KNEE ARTHROPLASTY;  Surgeon: Ollen Gross, MD;  Location: [facility];  Service: Orthopedics;  Laterality: Right;    Physical exam:  Vitals:   05/05/20 1133  BP: (!) 143/78  Pulse: 67  Resp: 20  Temp: 97.9 F (36.6 C)  SpO2: 96%  Weight: 212 lb (96.2 kg)  Height: 5\' 8"  (1.727 m)    Extremities: Well-healed right below-knee  amputation with callus over the tibial transection point of the right leg.  No open wound  Left foot no open wound mild erythema in the left calf and trace edema, no palpable pedal pulses  Data: Patient had a left leg ABI performed today.  Vessels were calcified.  Toe pressure on the left side was 0.  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  He has previously had a right below-knee amputation.  He is having some problems with a callus on the BKA currently.  He currently does not have any open wounds or rest pain in  the left foot.  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  Plan: Patient was counseled today on how to protect his foot and check it daily.  He will follow up with me as soon as possible if he develops any wounds or rest pain in the left foot.  Patient was given a referral to biotech today to try to pad his prosthetic to prevent callus formation in the right leg.  The patient will follow up with me in 6 months time with a repeat left leg ABI with toe pressure  Fabienne Bruns, MD [facility] Office: [phone]

## 2020-05-06 ENCOUNTER — Other Ambulatory Visit: Payer: Self-pay | Admitting: *Deleted

## 2020-05-06 DIAGNOSIS — I739 Peripheral vascular disease, unspecified: Secondary | ICD-10-CM

## 2020-05-10 ENCOUNTER — Ambulatory Visit: Payer: Medicare HMO | Admitting: Urology

## 2020-05-17 ENCOUNTER — Ambulatory Visit (INDEPENDENT_AMBULATORY_CARE_PROVIDER_SITE_OTHER): Payer: Medicare HMO | Admitting: Urology

## 2020-05-17 ENCOUNTER — Other Ambulatory Visit: Payer: Self-pay

## 2020-05-17 ENCOUNTER — Encounter: Payer: Self-pay | Admitting: Urology

## 2020-05-17 VITALS — BP 178/81 | HR 73

## 2020-05-17 DIAGNOSIS — N138 Other obstructive and reflux uropathy: Secondary | ICD-10-CM | POA: Diagnosis not present

## 2020-05-17 DIAGNOSIS — N401 Enlarged prostate with lower urinary tract symptoms: Secondary | ICD-10-CM | POA: Diagnosis not present

## 2020-05-17 DIAGNOSIS — R339 Retention of urine, unspecified: Secondary | ICD-10-CM

## 2020-05-17 DIAGNOSIS — N390 Urinary tract infection, site not specified: Secondary | ICD-10-CM | POA: Diagnosis not present

## 2020-05-17 LAB — URINALYSIS, ROUTINE W REFLEX MICROSCOPIC
Bilirubin, UA: NEGATIVE
Glucose, UA: NEGATIVE
Ketones, UA: NEGATIVE
Nitrite, UA: NEGATIVE
Protein,UA: NEGATIVE
RBC, UA: NEGATIVE
Specific Gravity, UA: 1.015 (ref 1.005–1.030)
Urobilinogen, Ur: 0.2 mg/dL (ref 0.2–1.0)
pH, UA: 7 (ref 5.0–7.5)

## 2020-05-17 LAB — MICROSCOPIC EXAMINATION
RBC, Urine: NONE SEEN /hpf (ref 0–2)
Renal Epithel, UA: NONE SEEN /hpf
WBC, UA: >30 /hpf — AB (ref 0–5)

## 2020-05-17 LAB — BLADDER SCAN AMB NON-IMAGING: Scan Result: 196

## 2020-05-17 NOTE — Progress Notes (Signed)
H&P  Chief Complaint: BPH w/ LUTS  History of Present Illness: Pt here for annual f/u.  8.17.2021: Pt presents with significant urinary symptoms as a result of his BPH. Pt experiencing urgency occasional incontinence.  He does have limited mobility due to his BKA.  Pt reports that his FOS has diminished in the last month. Pt denies any recent symptomatic UTIs. Pt denies any constipation.  IPSS Questionnaire (AUA-7): Over the past month.   1)  How often have you had a sensation of not emptying your bladder completely after you finish urinating?  2 - Less than half the time  2)  How often have you had to urinate again less than two hours after you finished urinating? 4 - More than half the time  3)  How often have you found you stopped and started again several times when you urinated?  3 - About half the time  4) How difficult have you found it to postpone urination?  5 - Almost always  5) How often have you had a weak urinary stream?  5 - Almost always  6) How often have you had to push or strain to begin urination?  4 - More than half the time  7) How many times did you most typically get up to urinate from the time you went to bed until the time you got up in the morning?  3 - 3 times  Total score:  0-7 mildly symptomatic   8-19 moderately symptomatic   20-35 severely symptomatic   QOL score: 5   (below copied from [facility] records):  8.13.2019: Initial visit for BPH. He had been on Flomax previously but took himself off because of worry of side effects. He did have retention issues following BKA. He had moderate voiding symptomatology had his 1st visit. He was placed on tamsulosin.   11.19.2019: He is voiding better. He has been treated with abx for recent a UTI--presented with dysuria. PVR volume 197 ml. He was continued on tamsulosin but twice daily.   2.11.2020: Stream might be slowing down some. He is not currently taking his tamsulosin twice a day.   8.18.2020: He continues to only  take tamsulosin 1x a day. He discontinued finasteride because he was too dizzy while taking it. He has fallen but this was mostly due to his previously ill-fitted prosthetic. He is mostly satisfied with his urinary pattern and feels like he is emptying quite well.   8.18.2020 Burle Kwan is a 79 year-old male established patient who is here for an enlarged prostate follow-up evaluation. He is currently taking tamsulosin and rapaflo.  Past Medical History:  Diagnosis Date  . Arthritis   . Chest pain, unspecified   . DVT (deep venous thrombosis) (HCC)   . GERD (gastroesophageal reflux disease)   . Heart murmur    hx of   . History of hiatal hernia   . HTN (hypertension)   . Lupus (HCC)   . Lupus (HCC)   . Peripheral vascular disease (HCC)    nonviable tissue   . Shingles    hx of   . Stroke [facility]) 2010   'MILD" per pt-  weakness left side  . Tinnitus   . Ulcer of abdomen wall (HCC)   . Wears dentures   . Wears glasses     Past Surgical History:  Procedure Laterality Date  . ABDOMINAL AORTOGRAM W/LOWER EXTREMITY N/A 02/17/2018   Procedure: ABDOMINAL AORTOGRAM W/LOWER EXTREMITY;  Surgeon: Maeola Harman, MD;  Location: [facility] INVASIVE CV LAB;  Service: Cardiovascular;  Laterality: N/A;  . AMPUTATION Right 03/03/2018   Procedure: AMPUTATION RIGHT SECOND TOE;  Surgeon: Sherren Kerns, MD;  Location: [facility] OR;  Service: Vascular;  Laterality: Right;  . AMPUTATION Right 03/18/2018   Procedure: AMPUTATION BELOW KNEE;  Surgeon: Sherren Kerns, MD;  Location: [facility] OR;  Service: Vascular;  Laterality: Right;  . BELOW KNEE LEG AMPUTATION Right 03/18/2018  . bilateral cataract surgery     . BIOPSY N/A 10/22/2017   Procedure: BIOPSY;  Surgeon: Franky Macho, MD;  Location: [facility] ENDO SUITE;  Service: Gastroenterology;  Laterality: N/A;  . CATARACT EXTRACTION Bilateral 09/20/2016  . COLONOSCOPY N/A 09/18/2016   Dr. Lovell Sheehan: normal  . ESOPHAGOGASTRODUODENOSCOPY N/A 10/22/2017   Dr.  Lovell Sheehan: normal duodenum, gastric mucosal atrophy but negative H.pylori (Clotest negative), normal GE junction  . GIVENS CAPSULE STUDY N/A 01/27/2018   Procedure: GIVENS CAPSULE STUDY;  Surgeon: Corbin Ade, MD;  Location: [facility] ENDO SUITE;  Service: Endoscopy;  Laterality: N/A;  7:00am  . HERNIA REPAIR     2010/laparoscopic/right  . ivc filter     01/2013   . KNEE ARTHROSCOPY Right    2007  . MULTIPLE TOOTH EXTRACTIONS    . PERIPHERAL VASCULAR BALLOON ANGIOPLASTY  02/17/2018   Procedure: PERIPHERAL VASCULAR BALLOON ANGIOPLASTY;  Surgeon: Maeola Harman, MD;  Location: [facility] INVASIVE CV LAB;  Service: Cardiovascular;;  . TOTAL HIP ARTHROPLASTY Right 05/01/2017   Procedure: RIGHT TOTAL HIP ARTHROPLASTY ANTERIOR APPROACH;  Surgeon: Ollen Gross, MD;  Location: [facility] ORS;  Service: Orthopedics;  Laterality: Right;  . TOTAL KNEE ARTHROPLASTY Left 12/13/2014   Procedure: LEFT TOTAL KNEE ARTHROPLASTY;  Surgeon: Ollen Gross, MD;  Location: [facility] ORS;  Service: Orthopedics;  Laterality: Left;  . TOTAL KNEE ARTHROPLASTY Right 03/21/2015   Procedure: RIGHT TOTAL KNEE ARTHROPLASTY;  Surgeon: Ollen Gross, MD;  Location: [facility] ORS;  Service: Orthopedics;  Laterality: Right;    Home Medications:  Allergies as of 05/17/2020      Reactions   Sulfa Antibiotics       Medication List       Accurate as of May 17, 2020 12:45 PM. If you have any questions, ask your nurse or doctor.        acetaminophen 500 MG tablet Commonly known as: TYLENOL Take 500 mg by mouth every 6 (six) hours as needed for moderate pain or headache.   ciprofloxacin 250 MG tablet Commonly known as: Cipro Take 1 tablet (250 mg total) by mouth 2 (two) times daily.   docusate sodium 100 MG capsule Commonly known as: COLACE Take 100 mg by mouth daily.   ESTER C PO Take 500 mg by mouth daily.   feeding supplement (ENSURE ENLIVE) Liqd Take 237 mLs by mouth 2 (two) times daily between meals.   lactose free nutrition  Liqd Take 237 mLs by mouth 3 (three) times daily with meals.   finasteride 5 MG tablet Commonly known as: PROSCAR   hydroxychloroquine 200 MG tablet Commonly known as: PLAQUENIL Take 200 mg by mouth 2 (two) times daily.   ICY HOT EX Apply 1 application topically 2 (two) times daily as needed (left knee.).   lisinopril-hydrochlorothiazide 10-12.5 MG tablet Commonly known as: ZESTORETIC Take 1 tablet by mouth daily with breakfast.   methenamine 1 g tablet Commonly known as: HIPREX Take 1 tablet (1 g total) by mouth 2 (two) times daily with a meal.   multivitamin with minerals Tabs tablet Take 1 tablet  by mouth daily. Centrum Silver   OMEGA-3 KRILL OIL PO Take 1 capsule by mouth daily.   omeprazole 20 MG capsule Commonly known as: PRILOSEC Take 20 mg by mouth daily.   predniSONE 5 MG tablet Commonly known as: DELTASONE   tamsulosin 0.4 MG Caps capsule Commonly known as: FLOMAX Take 2 capsules (0.8 mg total) by mouth daily.   URINOZINC PO Take 1 capsule by mouth 2 (two) times daily.   warfarin 5 MG tablet Commonly known as: COUMADIN Take 1 tablet (5 mg total) by mouth every morning. Take Coumadin for three weeks for postoperative protocol and then the patient may resume their previous Coumadin home regimen.  The dose may need to be adjusted based upon the INR.  Please follow the INR and titrate Coumadin dose for a therapeutic range between 2.0 and 3.0 INR.  After completing the three weeks of Coumadin, the patient may resume their previous Coumadin home regimen. What changed:   when to take this  additional instructions       Allergies:  Allergies  Allergen Reactions  . Sulfa Antibiotics     Family History  Problem Relation Age of Onset  . Hypertension Other   . Stroke Mother   . Stroke Father   . Heart failure Sister   . CAD Brother        CABG age 40  . Colon cancer Neg Hx     Social History:  reports that he quit smoking about 36 years ago. His  smoking use included cigarettes. He has a 20.00 pack-year smoking history. He has never used smokeless tobacco. He reports that he does not drink alcohol and does not use drugs.  ROS: A complete review of systems was performed.  All systems are negative except for pertinent findings as noted.  Physical Exam:  Vital signs in last 24 hours: There were no vitals taken for this visit. Constitutional:  Alert and oriented, No acute distress Cardiovascular: Regular rate  Respiratory: Normal respiratory effort Neurologic: Grossly intact, no focal deficits Psychiatric: Normal mood and affect  I have reviewed prior pt notes  I have reviewed notes from referring/previous physicians  I have reviewed urinalysis results  I have independently reviewed prior imaging  I have reviewed prior PSA results  I have reviewed prior urine culture  Cystoscopy Procedure Note:  Indication: Bladder outlet obstruction  After informed consent and discussion of the procedure and its risks, CHARLIE SEDA was positioned and prepped in the standard fashion.  Cystoscopy was performed with a flexible cystoscope.   Findings: Urethra: No lesions/stricture Prostate: Bilobar hypertrophy with obstruction Bladder neck: Normal Ureteral orifices: Normal bilaterally Bladder: 2+ trabeculations noted with some cellules.  No urothelial abnormalities.  The patient tolerated the procedure well.    Impression/Assessment:  1.  Chronic colonization with Serratia.  He currently is asymptomatic.  He is on suppression with methenamine  2.  BPH with obstruction.  He desires further management other than medical therapy  Plan:  1. Pt advised regarding surgical remedy for BPH related symptomatology and provided with informational brochures ([facility] & TURP).  2. F/U with prostate ultrasound at next available appointment.  I will call with that result and discuss further management  CC: Dr. Dimas Aguas

## 2020-05-17 NOTE — Progress Notes (Signed)
Urological Symptom Review  Patient is experiencing the following symptoms: none   Review of Systems  Gastrointestinal (upper)  : Negative for upper GI symptoms  Gastrointestinal (lower) : Negative for lower GI symptoms  Constitutional : Negative for symptoms  Skin: Negative for skin symptoms  Eyes: Negative for eye symptoms  Ear/Nose/Throat : Negative for Ear/Nose/Throat symptoms  Hematologic/Lymphatic: Negative for Hematologic/Lymphatic symptoms  Cardiovascular : Negative for cardiovascular symptoms  Respiratory : Negative for respiratory symptoms  Endocrine: Negative for endocrine symptoms  Musculoskeletal: Negative for musculoskeletal symptoms  Neurological: Negative for neurological symptoms  Psychologic: Negative for psychiatric symptoms   Pt here today for bladder scan. Bladder was scanned and 196 was visualized.

## 2020-05-17 NOTE — Patient Instructions (Signed)
Transurethral Resection of the Prostate Transurethral resection of the prostate (TURP) is the removal (resection) of part of the gland that produces semen (prostate gland). This procedure is done to treat benign prostatic hyperplasia (BPH). BPH is an abnormal, noncancerous (benign) increase in the number of cells that make up the prostate tissue. BPH causes the prostate to get bigger. The enlarged prostate can push against or block the tube that drains urine from the bladder out of the body (urethra). BPH can affect normal urine flow by causing bladder infections, difficulty controlling bladder function, and difficulty emptying the bladder.  The goal of TURP is to remove enough prostate tissue to allow for a normal flow of urine. The procedure will allow you to empty your bladder more completely when you urinate so that you can urinate less often. In a transurethral resection, a thin telescope with a light, a tiny camera, and an electric cutting edge (resectoscope) is passed through the urethra and into the prostate. The opening of the urethra is at the end of the penis. Tell a health care provider about:  Any allergies you have.  All medicines you are taking, including vitamins, herbs, eye drops, creams, and over-the-counter medicines.  Any problems you or family members have had with anesthetic medicines.  Any blood disorders you have.  Any surgeries you have had.  Any medical conditions you have.  Any prostate infections you have had. What are the risks? Generally, this is a safe procedure. However, problems may occur, including:  Infection.  Bleeding.  Allergic reactions to medicines.  Damage to other structures or organs, such as: ? The urethra. ? The bladder. ? Muscles that surround the prostate.  Difficulty getting an erection.  Inability to control when you urinate (incontinence).  Scarring, which may cause problems with urine flow. What happens before the  procedure? Medicines Ask your health care provider about:  Changing or stopping your regular medicines. This is especially important if you are taking diabetes medicines or blood thinners.  Taking medicines such as aspirin and ibuprofen. These medicines can thin your blood. Do not take these medicines unless your health care provider tells you to take them.  Taking over-the-counter medicines, vitamins, herbs, and supplements. Eating and drinking Follow instructions from your health care provider about eating and drinking, which may include:  8 hours before the procedure - stop eating heavy meals or foods, such as meat, fried foods, or fatty foods.  6 hours before the procedure - stop eating light meals or foods, such as toast or cereal.  6 hours before the procedure - stop drinking milk or drinks that contain milk.  2 hours before the procedure - stop drinking clear liquids. Staying hydrated Follow instructions from your health care provider about hydration, which may include:  Up to 2 hours before the procedure - you may continue to drink clear liquids, such as water, clear fruit juice, black coffee, and plain tea. General instructions  You may have a physical exam.  You may have a blood or urine sample taken.  Ask your health care provider what steps will be taken to help prevent infection. These may include: ? Washing skin with a germ-killing soap. ? Taking antibiotic medicine.  Plan to have someone take you home from the hospital or clinic. You may not be able to drive for up to 10 days after your procedure.  Plan to have a responsible adult care for you for at least 24 hours after you leave the hospital or   clinic. This is important. What happens during the procedure?   An IV will be inserted into one of your veins.  You will be given one or more of the following: ? A medicine to help you relax (sedative). ? A medicine to make you fall asleep (general anesthetic). ? A  medicine that is injected into your spine to numb the area below and slightly above the injection site (spinal anesthetic).  Your legs will be placed in foot rests (stirrups) so that your legs are apart and your knees are bent.  The resectoscope will be passed through your urethra to your prostate.  Parts of your prostate will be resected using the cutting edge of the resectoscope.  The resectoscope will be removed.  A small, thin tube (catheter) will be passed through your urethra and into your bladder. The catheter will drain urine into a bag outside of your body. ? Fluid may be passed through the catheter to keep the catheter open. The procedure may vary among health care providers and hospitals. What happens after the procedure?  Your blood pressure, heart rate, breathing rate, and blood oxygen level will be monitored until you leave the hospital or clinic.  You may continue to receive fluids and medicines through an IV.  You may have some pain. Pain medicine will be available to help you.  You will have a catheter draining your urine. ? You may have blood in your urine. Your catheter may be kept in until your urine is clear. ? Your urinary drainage will be monitored. If necessary, your bladder may be rinsed out (irrigated) through your catheter.  You will be encouraged to walk around as soon as possible.  You may have to wear compression stockings. These stockings help prevent blood clots and reduce swelling in your legs.  Do not drive for 24 hours if you were given a sedative during your procedure. Summary  Transurethral resection of the prostate (TURP) is the removal (resection) of part of the gland that produces semen (prostate gland).  The goal of this procedure is to remove enough prostate tissue to allow for a normal flow of urine.  Follow instructions from your health care provider about taking medicines and about eating and drinking before the procedure. This  information is not intended to replace advice given to you by your health care provider. Make sure you discuss any questions you have with your health care provider. Document Revised: 01/07/2019 Document Reviewed: 06/18/2018 [employer] Patient Education  2020 [employer]  

## 2020-05-19 DIAGNOSIS — I82499 Acute embolism and thrombosis of other specified deep vein of unspecified lower extremity: Secondary | ICD-10-CM | POA: Diagnosis not present

## 2020-05-19 DIAGNOSIS — I4891 Unspecified atrial fibrillation: Secondary | ICD-10-CM | POA: Diagnosis not present

## 2020-05-23 ENCOUNTER — Encounter: Payer: Self-pay | Admitting: Cardiology

## 2020-05-23 NOTE — Progress Notes (Signed)
Cardiology Office Note  Date: 05/24/2020   ID: Jose Bush, DOB 1941/02/23, MRN [id number]  PCP:  Selinda Flavin, MD  Cardiologist:  Nona Dell, MD Electrophysiologist:  None   Chief Complaint  Patient presents with  . Heart Murmur    History of Present Illness: Jose Bush is a 79 y.o. male referred for cardiology consultation by Dr. Dimas Aguas for the evaluation of aortic stenosis.  He is here today with his wife.  I reviewed his medical history which is outlined below.  He tells me that he is functional with ADLs and chores around the house, also walks some for exercise.  He does have a right lower leg prosthesis.  Reports chronic recurring weakness, somewhat progressive over the years, arthritic symptoms related to lupus have been fairly well controlled however on low-dose steroids and Plaquenil. He follows regularly with a rheumatologist in [place].  He states that he has been aware of having a heart murmur for several years, thinks that his last echocardiogram was at least 5 years ago. I could not locate a recent echocardiogram report in Epic, no significant aortic stenosis was described by a more remote study in 2010.  I reviewed his medications which are outlined below.  I personally reviewed his ECG today which shows sinus rhythm with prolonged PR interval.  Past Medical History:  Diagnosis Date  . Aortic stenosis   . Arthritis   . BPH (benign prostatic hyperplasia)   . Essential hypertension   . GERD (gastroesophageal reflux disease)   . History of DVT (deep vein thrombosis)    Positive anticardiolipin  . History of hiatal hernia   . History of stroke 2010  . Lupus (HCC)   . PAD (peripheral artery disease) (HCC)    Right below-knee amputation in 2019  . Shingles   . Tinnitus   . Wears dentures   . Wears glasses     Past Surgical History:  Procedure Laterality Date  . ABDOMINAL AORTOGRAM W/LOWER EXTREMITY N/A 02/17/2018   Procedure: ABDOMINAL  AORTOGRAM W/LOWER EXTREMITY;  Surgeon: Maeola Harman, MD;  Location: [facility] INVASIVE CV LAB;  Service: Cardiovascular;  Laterality: N/A;  . AMPUTATION Right 03/03/2018   Procedure: AMPUTATION RIGHT SECOND TOE;  Surgeon: Sherren Kerns, MD;  Location: [facility] OR;  Service: Vascular;  Laterality: Right;  . AMPUTATION Right 03/18/2018   Procedure: AMPUTATION BELOW KNEE;  Surgeon: Sherren Kerns, MD;  Location: [facility] OR;  Service: Vascular;  Laterality: Right;  . BELOW KNEE LEG AMPUTATION Right 03/18/2018  . bilateral cataract surgery     . BIOPSY N/A 10/22/2017   Procedure: BIOPSY;  Surgeon: Franky Macho, MD;  [facility];  Service: Gastroenterology;  Laterality: N/A;  . CATARACT EXTRACTION Bilateral 09/20/2016  . COLONOSCOPY N/A 09/18/2016   Dr. Lovell Sheehan: normal  . ESOPHAGOGASTRODUODENOSCOPY N/A 10/22/2017   Dr. Lovell Sheehan: normal duodenum, gastric mucosal atrophy but negative H.pylori (Clotest negative), normal GE junction  . GIVENS CAPSULE STUDY N/A 01/27/2018   Procedure: GIVENS CAPSULE STUDY;  Surgeon: Corbin Ade, MD;  [facility];  Service: Endoscopy;  Laterality: N/A;  7:00am  . HERNIA REPAIR     2010/laparoscopic/right  . IVC FILTER INSERTION     01/2013   . KNEE ARTHROSCOPY Right    2007  . MULTIPLE TOOTH EXTRACTIONS    . PERIPHERAL VASCULAR BALLOON ANGIOPLASTY  02/17/2018   Procedure: PERIPHERAL VASCULAR BALLOON ANGIOPLASTY;  Surgeon: Maeola Harman, MD;  Location: [facility] INVASIVE CV LAB;  Service: Cardiovascular;;  . TOTAL HIP ARTHROPLASTY Right 05/01/2017   Procedure: RIGHT TOTAL HIP ARTHROPLASTY ANTERIOR APPROACH;  Surgeon: Ollen Gross, MD;  Location: [facility] ORS;  Service: Orthopedics;  Laterality: Right;  . TOTAL KNEE ARTHROPLASTY Left 12/13/2014   Procedure: LEFT TOTAL KNEE ARTHROPLASTY;  Surgeon: Ollen Gross, MD;  Location: [facility] ORS;  Service: Orthopedics;  Laterality: Left;  . TOTAL KNEE ARTHROPLASTY Right 03/21/2015   Procedure: RIGHT TOTAL KNEE  ARTHROPLASTY;  Surgeon: Ollen Gross, MD;  Location: [facility] ORS;  Service: Orthopedics;  Laterality: Right;    Current Outpatient Medications  Medication Sig Dispense Refill  . acetaminophen (TYLENOL) 500 MG tablet Take 500 mg by mouth every 6 (six) hours as needed for moderate pain or headache.     Marland Kitchen Bioflavonoid Products (ESTER C PO) Take 500 mg by mouth daily.    . ciprofloxacin (CIPRO) 250 MG tablet Take 1 tablet (250 mg total) by mouth 2 (two) times daily. 14 tablet 0  . docusate sodium (COLACE) 100 MG capsule Take 100 mg by mouth daily.     . feeding supplement, ENSURE ENLIVE, (ENSURE ENLIVE) LIQD Take 237 mLs by mouth 2 (two) times daily between meals. 237 mL 12  . finasteride (PROSCAR) 5 MG tablet     . hydroxychloroquine (PLAQUENIL) 200 MG tablet Take 200 mg by mouth 2 (two) times daily.    Marland Kitchen lactose free nutrition (BOOST PLUS) LIQD Take 237 mLs by mouth 3 (three) times daily with meals.  0  . lisinopril-hydrochlorothiazide (PRINZIDE,ZESTORETIC) 10-12.5 MG per tablet Take 1 tablet by mouth daily with breakfast.     . Menthol, Topical Analgesic, (ICY HOT EX) Apply 1 application topically 2 (two) times daily as needed (left knee.).     Marland Kitchen methenamine (HIPREX) 1 g tablet Take 1 tablet (1 g total) by mouth 2 (two) times daily with a meal. 180 tablet 3  . methenamine (MANDELAMINE) 1 g tablet     . Misc Natural Products (URINOZINC PO) Take 1 capsule by mouth 2 (two) times daily.     . Multiple Vitamin (MULTIVITAMIN WITH MINERALS) TABS tablet Take 1 tablet by mouth daily. Centrum Silver    . OMEGA-3 KRILL OIL PO Take 1 capsule by mouth daily.    Marland Kitchen omeprazole (PRILOSEC) 20 MG capsule Take 20 mg by mouth daily.    . predniSONE (DELTASONE) 5 MG tablet     . tamsulosin (FLOMAX) 0.4 MG CAPS capsule Take 2 capsules (0.8 mg total) by mouth daily. 60 capsule 0  . warfarin (COUMADIN) 5 MG tablet Take 1 tablet (5 mg total) by mouth every morning. Take Coumadin for three weeks for postoperative protocol  and then the patient may resume their previous Coumadin home regimen.  The dose may need to be adjusted based upon the INR.  Please follow the INR and titrate Coumadin dose for a therapeutic range between 2.0 and 3.0 INR.  After completing the three weeks of Coumadin, the patient may resume their previous Coumadin home regimen. (Patient taking differently: Take 5 mg by mouth daily. ) 35 tablet 0   No current facility-administered medications for this visit.   Allergies:  Sulfa antibiotics   Social History: The patient  reports that he quit smoking about 36 years ago. His smoking use included cigarettes. He has a 20.00 pack-year smoking history. He has never used smokeless tobacco. He reports that he does not drink alcohol and does not use drugs.   Family History: The patient's family history includes CAD in his  brother; Heart failure in his sister; Hypertension in an other family member; Stroke in his father and mother.   ROS:   No palpitations or syncope.  Physical Exam: VS:  BP (!) 120/52   Pulse 78   Ht 5\' 8"  (1.727 m)   Wt 216 lb (98 kg)   SpO2 98%   BMI 32.84 kg/m , BMI Body mass index is 32.84 kg/m.  Wt Readings from Last 3 Encounters:  05/24/20 216 lb (98 kg)  05/05/20 212 lb (96.2 kg)  01/05/20 212 lb (96.2 kg)    General: Patient appears comfortable at rest. HEENT: Conjunctiva and lids normal, wearing a mask. Neck: Supple, no elevated JVP or carotid bruits, no thyromegaly. Lungs: Clear to auscultation, nonlabored breathing at rest. Cardiac: Regular rate and rhythm, no S3, 2-3/6 systolic murmur, no pericardial rub. Abdomen: Soft, bowel sounds present. Extremities: Right lower leg prosthesis in place. Skin: Warm and dry. Musculoskeletal: No kyphosis. Neuropsychiatric: Alert and oriented x3, affect grossly appropriate.  ECG:  An ECG dated 08/10/2019 was personally reviewed today and demonstrated:  Sinus rhythm with prolonged PR interval.  Recent Labwork: 08/10/2019: BUN  17; Creatinine, Ser 0.73; Hemoglobin 11.4; Platelets 158; Potassium 3.6; Sodium 134     Component Value Date/Time   CHOL 124 02/02/2013 0530   TRIG 124 02/02/2013 0530   HDL 36 (L) 02/02/2013 0530   CHOLHDL 3.4 02/02/2013 0530   VLDL 25 02/02/2013 0530   LDLCALC 63 02/02/2013 0530    Other Studies Reviewed Today:  No recent cardiac testing for review.  Assessment and Plan:  1.  Systolic heart murmur in aortic position with possible history of aortic stenosis although no recent echocardiogram that I was able to locate.  Plan is to obtain an echocardiogram for baseline and determine appropriate follow-up plan from there.  2.  History of PAD status post right below the knee amputation in 2019.  3.  Hypercoagulable state, positive anticardiolipin with prior history of DVT and IVC filter in place.  He is on chronic Coumadin with follow-up by PCP.  4.  Systemic lupus erythematosus, on Plaquenil and low-dose prednisone with follow-up by rheumatology.  5.  Essential hypertension on Prinzide.  Medication Adjustments/Labs and Tests Ordered: Current medicines are reviewed at length with the patient today.  Concerns regarding medicines are outlined above.   Tests Ordered: Orders Placed This Encounter  Procedures  . EKG 12-Lead  . ECHOCARDIOGRAM COMPLETE    Medication Changes: No orders of the defined types were placed in this encounter.   Disposition:  Follow up test results and determine next step.  Signed, 2020, MD, [facility] 05/24/2020 9:34 AM    [facility] at [facility] [address] [place], [place], [place] Phone: [phone]; Fax: [phone]

## 2020-05-24 ENCOUNTER — Encounter: Payer: Self-pay | Admitting: Cardiology

## 2020-05-24 ENCOUNTER — Ambulatory Visit: Payer: Medicare HMO | Admitting: Cardiology

## 2020-05-24 ENCOUNTER — Other Ambulatory Visit: Payer: Self-pay

## 2020-05-24 VITALS — BP 120/52 | HR 78 | Ht 68.0 in | Wt 216.0 lb

## 2020-05-24 DIAGNOSIS — D6859 Other primary thrombophilia: Secondary | ICD-10-CM

## 2020-05-24 DIAGNOSIS — I10 Essential (primary) hypertension: Secondary | ICD-10-CM

## 2020-05-24 DIAGNOSIS — I350 Nonrheumatic aortic (valve) stenosis: Secondary | ICD-10-CM

## 2020-05-24 NOTE — Patient Instructions (Addendum)
Medication Instructions:   Your physician recommends that you continue on your current medications as directed. Please refer to the Current Medication list given to you today.  Labwork:  NONE  Testing/Procedures: Your physician has requested that you have an echocardiogram. Echocardiography is a painless test that uses sound waves to create images of your heart. It provides your doctor with information about the size and shape of your heart and how well your heart's chambers and valves are working. This procedure takes approximately one hour. There are no restrictions for this procedure.  Follow-Up:  Your physician recommends that you schedule a follow-up appointment in: pending.  Any Other Special Instructions Will Be Listed Below (If Applicable).  If you need a refill on your cardiac medications before your next appointment, please call your pharmacy. 

## 2020-05-26 ENCOUNTER — Ambulatory Visit (HOSPITAL_COMMUNITY)
Admission: RE | Admit: 2020-05-26 | Discharge: 2020-05-26 | Disposition: A | Discharge status: Home / Self Care | Payer: Medicare HMO | Source: Ambulatory Visit | Attending: Urology | Admitting: Urology

## 2020-05-26 ENCOUNTER — Other Ambulatory Visit: Payer: Self-pay

## 2020-05-26 DIAGNOSIS — N138 Other obstructive and reflux uropathy: Secondary | ICD-10-CM | POA: Diagnosis not present

## 2020-05-26 DIAGNOSIS — N390 Urinary tract infection, site not specified: Secondary | ICD-10-CM | POA: Insufficient documentation

## 2020-05-26 DIAGNOSIS — N401 Enlarged prostate with lower urinary tract symptoms: Secondary | ICD-10-CM | POA: Diagnosis not present

## 2020-05-31 ENCOUNTER — Ambulatory Visit (INDEPENDENT_AMBULATORY_CARE_PROVIDER_SITE_OTHER): Payer: Medicare HMO

## 2020-05-31 DIAGNOSIS — I70229 Atherosclerosis of native arteries of extremities with rest pain, unspecified extremity: Secondary | ICD-10-CM | POA: Diagnosis not present

## 2020-05-31 DIAGNOSIS — I10 Essential (primary) hypertension: Secondary | ICD-10-CM | POA: Diagnosis not present

## 2020-05-31 DIAGNOSIS — I350 Nonrheumatic aortic (valve) stenosis: Secondary | ICD-10-CM

## 2020-05-31 DIAGNOSIS — M328 Other forms of systemic lupus erythematosus: Secondary | ICD-10-CM | POA: Diagnosis not present

## 2020-05-31 DIAGNOSIS — Z87891 Personal history of nicotine dependence: Secondary | ICD-10-CM | POA: Diagnosis not present

## 2020-05-31 LAB — ECHOCARDIOGRAM COMPLETE
AR max vel: 2.52 cm2
AV Area VTI: 2.66 cm2
AV Area mean vel: 2.68 cm2
AV Mean grad: 8.8 mmHg
AV Peak grad: 17.8 mmHg
Ao pk vel: 2.11 m/s
Area-P 1/2: 2.87 cm2
Calc EF: 74.3 %
MV M vel: 4.01 m/s
MV Peak grad: 64.2 mmHg
P 1/2 time: 635 msec
S' Lateral: 2.73 cm
Single Plane A2C EF: 76.4 %
Single Plane A4C EF: 72.9 %

## 2020-06-02 ENCOUNTER — Telehealth: Payer: Self-pay | Admitting: *Deleted

## 2020-06-02 NOTE — Telephone Encounter (Signed)
-----   Message from Jonelle Sidle, MD sent at 05/31/2020  2:54 PM EDT ----- Results reviewed.  Please let him know that the echocardiogram shows normal LVEF of 65 to 70%.  His aortic valve is moderately calcified but not stenotic at this point.  Recommend a 1 year follow-up for clinical reevaluation.

## 2020-06-02 NOTE — Telephone Encounter (Signed)
Patient informed. Copy sent to PCP °

## 2020-06-13 ENCOUNTER — Telehealth: Payer: Self-pay

## 2020-06-13 NOTE — Telephone Encounter (Signed)
-----   Message from Marcine Matar, MD sent at 06/13/2020 10:58 AM EDT -----  I called and left a message on both of her phone regarding results.  I will leave it up to her and patient decide if they want to have a TURP or Urolift.  A ----- Message ----- From: Dalia Heading, LPN Sent: 11/10/5619   3:55 PM EDT To: Marcine Matar, MD  Pts wife called again asking to know results of his latest Prostate [place]. It was done 8/26.

## 2020-06-13 NOTE — Telephone Encounter (Signed)
Pts wife called back after Dr. Retta Diones lft them a mes. She would like to talk to him more about procedure choices. Pt was scheduled for the 14th.

## 2020-06-13 NOTE — Telephone Encounter (Signed)
See prior task. 

## 2020-06-14 ENCOUNTER — Encounter: Payer: Self-pay | Admitting: Urology

## 2020-06-14 ENCOUNTER — Other Ambulatory Visit: Payer: Self-pay

## 2020-06-14 ENCOUNTER — Ambulatory Visit (INDEPENDENT_AMBULATORY_CARE_PROVIDER_SITE_OTHER): Payer: Medicare HMO | Admitting: Urology

## 2020-06-14 VITALS — BP 120/68 | HR 65 | Temp 98.0°F | Ht 68.0 in | Wt 216.0 lb

## 2020-06-14 DIAGNOSIS — R339 Retention of urine, unspecified: Secondary | ICD-10-CM

## 2020-06-14 DIAGNOSIS — N390 Urinary tract infection, site not specified: Secondary | ICD-10-CM | POA: Diagnosis not present

## 2020-06-14 DIAGNOSIS — N138 Other obstructive and reflux uropathy: Secondary | ICD-10-CM

## 2020-06-14 DIAGNOSIS — N401 Enlarged prostate with lower urinary tract symptoms: Secondary | ICD-10-CM

## 2020-06-14 LAB — URINALYSIS, ROUTINE W REFLEX MICROSCOPIC
Bilirubin, UA: NEGATIVE
Glucose, UA: NEGATIVE
Ketones, UA: NEGATIVE
Nitrite, UA: NEGATIVE
Protein,UA: NEGATIVE
Specific Gravity, UA: 1.015 (ref 1.005–1.030)
Urobilinogen, Ur: 0.2 mg/dL (ref 0.2–1.0)
pH, UA: 7.5 (ref 5.0–7.5)

## 2020-06-14 LAB — MICROSCOPIC EXAMINATION
Renal Epithel, UA: NONE SEEN /hpf
WBC, UA: >30 /hpf — AB (ref 0–5)

## 2020-06-14 NOTE — Progress Notes (Signed)
History of Present Illness: He comes in today for follow-up of recent prostate ultrasound done for volume.  This measured 67 mL.  They would like to proceed with intervention for his BPH, and to discuss UroLift versus TURP.  (below copied from [facility] records):  8.13.2019: Initial visit for BPH. He had been on Flomax previously but took himself off because of worry of side effects. He did have retention issues following BKA. He had moderate voiding symptomatology had his 1st visit. He was placed on tamsulosin.   11.19.2019: He is voiding better. He has been treated with abx for recent a UTI--presented with dysuria. PVR volume 197 ml. He was continued on tamsulosin but twice daily.   2.11.2020: Stream might be slowing down some. He is not currently taking his tamsulosin twice a day.   8.18.2020: He continues to only take tamsulosin 1x a day. He discontinued finasteride because he was too dizzy while taking it. He has fallen but this was mostly due to his previously ill-fitted prosthetic. He is mostly satisfied with his urinary pattern and feels like he is emptying quite well.   8.18.2020 Jose Bush is a [age] year-old male established patient who is here for an enlarged prostate follow-up evaluation. He is currently taking tamsulosin and rapaflo.  8.17.2021: Pt presents with significant urinary symptoms as a result of his BPH. Pt experiencing urgency occasional incontinence.  He does have limited mobility due to his BKA.  Pt reports that his FOS has diminished in the last month. Pt denies any recent symptomatic UTIs. Pt denies any constipation.  Past Medical History:  Diagnosis Date  . Aortic stenosis   . Arthritis   . BPH (benign prostatic hyperplasia)   . Essential hypertension   . GERD (gastroesophageal reflux disease)   . History of DVT (deep vein thrombosis)    Positive anticardiolipin  . History of hiatal hernia   . History of stroke 2010  . Lupus (HCC)   . PAD (peripheral  artery disease) (HCC)    Right below-knee amputation in 2019  . Shingles   . Tinnitus   . Wears dentures   . Wears glasses     Past Surgical History:  Procedure Laterality Date  . ABDOMINAL AORTOGRAM W/LOWER EXTREMITY N/A 02/17/2018   Procedure: ABDOMINAL AORTOGRAM W/LOWER EXTREMITY;  Surgeon: Maeola Harmanain, Brandon Christopher, MD;  [facility];  Service: Cardiovascular;  Laterality: N/A;  . AMPUTATION Right 03/03/2018   Procedure: AMPUTATION RIGHT SECOND TOE;  Surgeon: Sherren KernsFields, Charles E, MD;  [facility];  Service: Vascular;  Laterality: Right;  . AMPUTATION Right 03/18/2018   Procedure: AMPUTATION BELOW KNEE;  Surgeon: Sherren KernsFields, Charles E, MD;  [facility];  Service: Vascular;  Laterality: Right;  . BELOW KNEE LEG AMPUTATION Right 03/18/2018  . bilateral cataract surgery     . BIOPSY N/A 10/22/2017   Procedure: BIOPSY;  Surgeon: Franky MachoJenkins, Mark, MD;  [facility];  Service: Gastroenterology;  Laterality: N/A;  . CATARACT EXTRACTION Bilateral 09/20/2016  . COLONOSCOPY N/A 09/18/2016   Dr. Lovell SheehanJenkins: normal  . ESOPHAGOGASTRODUODENOSCOPY N/A 10/22/2017   Dr. Lovell SheehanJenkins: normal duodenum, gastric mucosal atrophy but negative H.pylori (Clotest negative), normal GE junction  . GIVENS CAPSULE STUDY N/A 01/27/2018   Procedure: GIVENS CAPSULE STUDY;  Surgeon: Corbin Adeourk, Robert M, MD;  [facility];  Service: Endoscopy;  Laterality: N/A;  7:00am  . HERNIA REPAIR     2010/laparoscopic/right  . IVC FILTER INSERTION     01/2013   .  KNEE ARTHROSCOPY Right    2007  . MULTIPLE TOOTH EXTRACTIONS    . PERIPHERAL VASCULAR BALLOON ANGIOPLASTY  02/17/2018   Procedure: PERIPHERAL VASCULAR BALLOON ANGIOPLASTY;  Surgeon: Maeola Harman, MD;  Location: [facility] INVASIVE CV LAB;  Service: Cardiovascular;;  . TOTAL HIP ARTHROPLASTY Right 05/01/2017   Procedure: RIGHT TOTAL HIP ARTHROPLASTY ANTERIOR APPROACH;  Surgeon: Ollen Gross, MD;  Location: [facility] ORS;  Service: Orthopedics;   Laterality: Right;  . TOTAL KNEE ARTHROPLASTY Left 12/13/2014   Procedure: LEFT TOTAL KNEE ARTHROPLASTY;  Surgeon: Ollen Gross, MD;  Location: [facility] ORS;  Service: Orthopedics;  Laterality: Left;  . TOTAL KNEE ARTHROPLASTY Right 03/21/2015   Procedure: RIGHT TOTAL KNEE ARTHROPLASTY;  Surgeon: Ollen Gross, MD;  Location: [facility] ORS;  Service: Orthopedics;  Laterality: Right;    Home Medications:  Allergies as of 06/14/2020      Reactions   Sulfa Antibiotics       Medication List       Accurate as of June 14, 2020 11:33 AM. If you have any questions, ask your nurse or doctor.        acetaminophen 500 MG tablet Commonly known as: TYLENOL Take 500 mg by mouth every 6 (six) hours as needed for moderate pain or headache.   ciprofloxacin 250 MG tablet Commonly known as: Cipro Take 1 tablet (250 mg total) by mouth 2 (two) times daily.   docusate sodium 100 MG capsule Commonly known as: COLACE Take 100 mg by mouth daily.   ESTER C PO Take 500 mg by mouth daily.   feeding supplement (ENSURE ENLIVE) Liqd Take 237 mLs by mouth 2 (two) times daily between meals.   lactose free nutrition Liqd Take 237 mLs by mouth 3 (three) times daily with meals.   finasteride 5 MG tablet Commonly known as: PROSCAR   hydroxychloroquine 200 MG tablet Commonly known as: PLAQUENIL Take 200 mg by mouth 2 (two) times daily.   ICY HOT EX Apply 1 application topically 2 (two) times daily as needed (left knee.).   lisinopril-hydrochlorothiazide 10-12.5 MG tablet Commonly known as: ZESTORETIC Take 1 tablet by mouth daily with breakfast.   methenamine 1 g tablet Commonly known as: MANDELAMINE   methenamine 1 g tablet Commonly known as: HIPREX Take 1 tablet (1 g total) by mouth 2 (two) times daily with a meal.   multivitamin with minerals Tabs tablet Take 1 tablet by mouth daily. Centrum Silver   OMEGA-3 KRILL OIL PO Take 1 capsule by mouth daily.   omeprazole 20 MG capsule Commonly known  as: PRILOSEC Take 20 mg by mouth daily.   predniSONE 5 MG tablet Commonly known as: DELTASONE   tamsulosin 0.4 MG Caps capsule Commonly known as: FLOMAX Take 2 capsules (0.8 mg total) by mouth daily.   URINOZINC PO Take 1 capsule by mouth 2 (two) times daily.   warfarin 5 MG tablet Commonly known as: COUMADIN Take 1 tablet (5 mg total) by mouth every morning. Take Coumadin for three weeks for postoperative protocol and then the patient may resume their previous Coumadin home regimen.  The dose may need to be adjusted based upon the INR.  Please follow the INR and titrate Coumadin dose for a therapeutic range between 2.0 and 3.0 INR.  After completing the three weeks of Coumadin, the patient may resume their previous Coumadin home regimen. What changed:   when to take this  additional instructions       Allergies:  Allergies  Allergen Reactions  . Sulfa  Antibiotics     Family History  Problem Relation Age of Onset  . Hypertension Other   . Stroke Mother   . Stroke Father   . Heart failure Sister   . CAD Brother        CABG age 27  . Colon cancer Neg Hx     Social History:  reports that he quit smoking about 36 years ago. His smoking use included cigarettes. He has a 20.00 pack-year smoking history. He has never used smokeless tobacco. He reports that he does not drink alcohol and does not use drugs.  ROS: A complete review of systems was performed.  All systems are negative except for pertinent findings as noted.  Physical Exam:  Vital signs in last 24 hours: BP 120/68   Pulse 65   Temp 98 F (36.7 C)   Ht 5\' 8"  (1.727 m)   Wt 216 lb (98 kg)   BMI 32.84 kg/m  Constitutional:  Alert and oriented, No acute distress Cardiovascular: Regular rate  Respiratory: Normal respiratory effort Lymphatic: No lymphadenopathy Neurologic: Grossly intact, no focal deficits Psychiatric: Normal mood and affect  Laboratory Data:  No results for input(s): WBC, HGB, HCT, PLT  in the last 72 hours.  No results for input(s): NA, K, CL, GLUCOSE, BUN, CALCIUM, CREATININE in the last 72 hours.  Invalid input(s): CO3   Results for orders placed or performed in visit on 06/14/20 (from the past 24 hour(s))  Urinalysis, Routine w reflex microscopic     Status: Abnormal   Collection Time: 06/14/20 10:56 AM  Result Value Ref Range   Specific Gravity, UA 1.015 1.005 - 1.030   pH, UA 7.5 5.0 - 7.5   Color, UA Amber (A) Yellow   Appearance Ur Hazy (A) Clear   Leukocytes,UA 1+ (A) Negative   Protein,UA Negative Negative/Trace   Glucose, UA Negative Negative   Ketones, UA Negative Negative   RBC, UA Trace (A) Negative   Bilirubin, UA Negative Negative   Urobilinogen, Ur 0.2 0.2 - 1.0 mg/dL   Nitrite, UA Negative Negative   Microscopic Examination See below:    Narrative   Performed at:  [address] - [facility] [address] [place], [place]  [place] Lab Director: [id number], Phone:  [phone]  Microscopic Examination     Status: Abnormal   Collection Time: 06/14/20 10:56 AM   Urine  Result Value Ref Range   WBC, UA >30 (A) 0 - 5 /hpf   RBC 0-2 0 - 2 /hpf   Epithelial Cells (non renal) 0-10 0 - 10 /hpf   Renal Epithel, UA None seen None seen /hpf   Bacteria, UA Many (A) None seen/Few   Narrative   Performed at:  [address] - [facility] [address], [place], [place]  [place] Lab Director: [id number], Phone:  [phone]   Recent Results (from the past 240 hour(s))  Microscopic Examination     Status: Abnormal   Collection Time: 06/14/20 10:56 AM   Urine  Result Value Ref Range Status   WBC, UA >30 (A) 0 - 5 /hpf Final   RBC 0-2 0 - 2 /hpf Final   Epithelial Cells (non renal) 0-10 0 - 10 /hpf Final   Renal Epithel, UA None seen None seen /hpf Final   Bacteria, UA Many (A) None seen/Few Final   I have reviewed prior pt notes  I have reviewed notes from referring/previous physicians  I have reviewed urinalysis  results  I have independently reviewed prior imaging  I have reviewed prior PSA results  I have reviewed prior urine cultureging:  Impression/Assessment:  BPH, significant lower urinary tract symptomatology despite maximal medical therapy  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  They were given literature on UroLift and TURP  2.  I did describe/discuss risks and benefits of each of these.  I do think he would be a good candidate for UroLift which would preclude the use of general anesthetic and hospitalization.  They would like to proceed  3.  I did culture his urine and will give him antibiotics starting about 5 days before his procedure.  This will be done in [place].

## 2020-06-14 NOTE — Progress Notes (Signed)
Urological Symptom Review  Patient is experiencing the following symptoms: Frequent urination Hard to postpone urination Get up at night to urinate Leakage of urine Stream starts and stops Trouble starting stream Weak stream   Review of Systems  Gastrointestinal (upper)  : Negative for upper GI symptoms  Gastrointestinal (lower) : Negative for lower GI symptoms  Constitutional : Negative for symptoms  Skin: Negative for skin symptoms  Eyes: Negative for eye symptoms  Ear/Nose/Throat : Negative for Ear/Nose/Throat symptoms  Hematologic/Lymphatic: Negative for Hematologic/Lymphatic symptoms  Cardiovascular : Negative for cardiovascular symptoms  Respiratory : Negative for respiratory symptoms  Endocrine: Negative for endocrine symptoms  Musculoskeletal: Negative for musculoskeletal symptoms  Neurological: Negative for neurological symptoms  Psychologic: Negative for psychiatric symptoms 

## 2020-06-18 LAB — URINE CULTURE

## 2020-06-21 ENCOUNTER — Other Ambulatory Visit: Payer: Self-pay | Admitting: Urology

## 2020-06-21 ENCOUNTER — Telehealth: Payer: Self-pay

## 2020-06-21 MED ORDER — CIPROFLOXACIN HCL 500 MG PO TABS: 500.0000 mg | ORAL_TABLET | Freq: Two times a day (BID) | ORAL | 0 refills | Status: DC

## 2020-06-21 NOTE — Telephone Encounter (Signed)
-----   Message from Marcine Matar, MD sent at 06/21/2020 12:37 PM EDT ----- Notify patient that the usual bacteria grew out.  I sent a prescription for Cipro in.  He is to take that starting 5 days before his procedure.  He needs to come off of Methenamine when he starts the Cipro.  I am sure he already knows this but he will need to stop the Coumadin beforehand as well. ----- Message ----- From: Ferdinand Lango, RN Sent: 06/20/2020   8:52 AM EDT To: Marcine Matar, MD  Please review

## 2020-06-21 NOTE — Telephone Encounter (Signed)
Wife notified of results.  Requested a posting sheet from Dr. Retta Diones and will send to [facility] for scheduling.

## 2020-06-24 ENCOUNTER — Telehealth: Payer: Self-pay | Admitting: *Deleted

## 2020-06-24 NOTE — Telephone Encounter (Signed)
Please comment on holding coumadin for urolift. He has a history of a stroke and aortic valve stenosis. Thank you. KL

## 2020-06-24 NOTE — Telephone Encounter (Signed)
Patient's INR/coumadin is managed by PCP office.  [phone].  Contacted office to organize bridging.  Dr Dimas Aguas out of office and physician assistant is not available.  Asked for call back and gave phone number

## 2020-06-24 NOTE — Telephone Encounter (Signed)
I agree with your recommendations for Lovenox bridge.

## 2020-06-24 NOTE — Telephone Encounter (Signed)
° °  [facility] Pre-operative Risk Assessment    HEARTCARE STAFF: - Please ensure there is not already an duplicate clearance open for this procedure. - Under Visit Info/Reason for Call, type in Other and utilize the format Clearance MM/DD/YY or Clearance TBD. Do not use dashes or single digits. - If request is for dental extraction, please clarify the # of teeth to be extracted.  Request for surgical clearance:  1. What type of surgery is being performed? Urolift  2. When is this surgery scheduled? 07/18/2020  3. What type of clearance is required (medical clearance vs. Pharmacy clearance to hold med vs. Both)? pharmacy  4. Are there any medications that need to be held prior to surgery and how long? Coumadin for 5 days prior  5. Practice name and name of physician performing surgery? [facility]  6. What is the office phone number? [phone]   7.   What is the office fax number? [phone]  8.   Anesthesia type (None, local, MAC, general) ?    Marlou Sa 06/24/2020, 8:17 AM  _________________________________________________________________   (provider comments below)

## 2020-06-24 NOTE — Telephone Encounter (Signed)
Patient with diagnosis of history of DVT due to hypercoaguable state (positive antiphospholipid antibodies/lupus anticoagulant disorder) on warfarin for anticoagulation.    Patient also has history of stroke in 2010.  Procedure: Urolift Date of procedure: 07/18/20  CrCl 80.4mL/min using adjusted body weight Platelet count 210K  Request for 5 day warfarin hold.  Per protocol, patient would require Lovenox bridge due to risk factors.  Will route to MD for input

## 2020-06-24 NOTE — Telephone Encounter (Signed)
Will route to Pharm D due to use of coumadin and hx of CVA.

## 2020-06-27 NOTE — Telephone Encounter (Signed)
Received a voicemail from Dr. Dimas Aguas (PCP) RN stating Dr. Dimas Aguas wanted pt to hold warfarin 3 days prior to procedure and resume 24 hr after.  I called and left message with front desk that patient needs to hold warfarin 5 day and he MUST be bridged with Lovenox. Call back number given.

## 2020-06-28 NOTE — Telephone Encounter (Signed)
   Primary Cardiologist: Nona Dell, MD  Chart reviewed as part of pre-operative protocol coverage.   Per pharmacy recommendations, patient can hold coumadin 5 days prior to his upcoming urolift but will require a lovenox bridge. Coumadin is managed by his PCP who has been updated to the above recommendations. Please follow-up with Dr. Dimas Aguas for any additional recommendations regarding this patients anticoagulation.  I will route this recommendation to the requesting party via Epic fax function and remove from pre-op pool.  Please call with questions.  Beatriz Stallion, PA-C 06/28/2020, 2:33 PM

## 2020-06-29 DIAGNOSIS — M329 Systemic lupus erythematosus, unspecified: Secondary | ICD-10-CM | POA: Diagnosis not present

## 2020-06-29 DIAGNOSIS — K219 Gastro-esophageal reflux disease without esophagitis: Secondary | ICD-10-CM | POA: Diagnosis not present

## 2020-06-29 DIAGNOSIS — R5383 Other fatigue: Secondary | ICD-10-CM | POA: Diagnosis not present

## 2020-06-29 DIAGNOSIS — D519 Vitamin B12 deficiency anemia, unspecified: Secondary | ICD-10-CM | POA: Diagnosis not present

## 2020-06-29 DIAGNOSIS — I10 Essential (primary) hypertension: Secondary | ICD-10-CM | POA: Diagnosis not present

## 2020-06-29 DIAGNOSIS — I4891 Unspecified atrial fibrillation: Secondary | ICD-10-CM | POA: Diagnosis not present

## 2020-06-30 DIAGNOSIS — I70229 Atherosclerosis of native arteries of extremities with rest pain, unspecified extremity: Secondary | ICD-10-CM | POA: Diagnosis not present

## 2020-06-30 DIAGNOSIS — Z87891 Personal history of nicotine dependence: Secondary | ICD-10-CM | POA: Diagnosis not present

## 2020-06-30 DIAGNOSIS — M328 Other forms of systemic lupus erythematosus: Secondary | ICD-10-CM | POA: Diagnosis not present

## 2020-06-30 DIAGNOSIS — I10 Essential (primary) hypertension: Secondary | ICD-10-CM | POA: Diagnosis not present

## 2020-07-08 DIAGNOSIS — R011 Cardiac murmur, unspecified: Secondary | ICD-10-CM | POA: Diagnosis not present

## 2020-07-08 DIAGNOSIS — I4891 Unspecified atrial fibrillation: Secondary | ICD-10-CM | POA: Diagnosis not present

## 2020-07-08 DIAGNOSIS — Z89511 Acquired absence of right leg below knee: Secondary | ICD-10-CM | POA: Diagnosis not present

## 2020-07-08 DIAGNOSIS — I10 Essential (primary) hypertension: Secondary | ICD-10-CM | POA: Diagnosis not present

## 2020-07-08 DIAGNOSIS — N400 Enlarged prostate without lower urinary tract symptoms: Secondary | ICD-10-CM | POA: Diagnosis not present

## 2020-07-08 DIAGNOSIS — R32 Unspecified urinary incontinence: Secondary | ICD-10-CM | POA: Diagnosis not present

## 2020-07-08 DIAGNOSIS — Z0001 Encounter for general adult medical examination with abnormal findings: Secondary | ICD-10-CM | POA: Diagnosis not present

## 2020-07-08 DIAGNOSIS — Z23 Encounter for immunization: Secondary | ICD-10-CM | POA: Diagnosis not present

## 2020-07-18 DIAGNOSIS — R3914 Feeling of incomplete bladder emptying: Secondary | ICD-10-CM | POA: Diagnosis not present

## 2020-07-18 DIAGNOSIS — N401 Enlarged prostate with lower urinary tract symptoms: Secondary | ICD-10-CM | POA: Diagnosis not present

## 2020-07-20 ENCOUNTER — Telehealth: Payer: Self-pay

## 2020-07-20 NOTE — Telephone Encounter (Signed)
Yes, for now continue

## 2020-07-21 ENCOUNTER — Other Ambulatory Visit: Payer: Self-pay

## 2020-07-21 DIAGNOSIS — N390 Urinary tract infection, site not specified: Secondary | ICD-10-CM

## 2020-07-21 MED ORDER — METHENAMINE HIPPURATE 1 G PO TABS: 1.0000 g | ORAL_TABLET | Freq: Two times a day (BID) | ORAL | 3 refills | Status: DC

## 2020-07-21 NOTE — Telephone Encounter (Signed)
Pts wife called back and was notified pt is to stay on methenamine for now per Dr Retta Diones. She asked for a new rx to be sent in for it for 30 days with several refills. Rx sent.

## 2020-07-22 DIAGNOSIS — I4891 Unspecified atrial fibrillation: Secondary | ICD-10-CM | POA: Diagnosis not present

## 2020-07-28 DIAGNOSIS — I82499 Acute embolism and thrombosis of other specified deep vein of unspecified lower extremity: Secondary | ICD-10-CM | POA: Diagnosis not present

## 2020-08-09 DIAGNOSIS — L03032 Cellulitis of left toe: Secondary | ICD-10-CM | POA: Diagnosis not present

## 2020-08-09 DIAGNOSIS — B351 Tinea unguium: Secondary | ICD-10-CM | POA: Diagnosis not present

## 2020-08-10 DIAGNOSIS — I4891 Unspecified atrial fibrillation: Secondary | ICD-10-CM | POA: Diagnosis not present

## 2020-08-17 DIAGNOSIS — H6121 Impacted cerumen, right ear: Secondary | ICD-10-CM | POA: Diagnosis not present

## 2020-08-17 DIAGNOSIS — H6122 Impacted cerumen, left ear: Secondary | ICD-10-CM | POA: Diagnosis not present

## 2020-08-30 ENCOUNTER — Encounter: Payer: Self-pay | Admitting: Urology

## 2020-08-30 ENCOUNTER — Other Ambulatory Visit: Payer: Self-pay

## 2020-08-30 ENCOUNTER — Ambulatory Visit (INDEPENDENT_AMBULATORY_CARE_PROVIDER_SITE_OTHER): Payer: Medicare HMO | Admitting: Urology

## 2020-08-30 VITALS — BP 176/75 | HR 60 | Temp 98.4°F | Wt 210.0 lb

## 2020-08-30 DIAGNOSIS — N138 Other obstructive and reflux uropathy: Secondary | ICD-10-CM | POA: Diagnosis not present

## 2020-08-30 DIAGNOSIS — R339 Retention of urine, unspecified: Secondary | ICD-10-CM | POA: Diagnosis not present

## 2020-08-30 DIAGNOSIS — N390 Urinary tract infection, site not specified: Secondary | ICD-10-CM | POA: Diagnosis not present

## 2020-08-30 DIAGNOSIS — N401 Enlarged prostate with lower urinary tract symptoms: Secondary | ICD-10-CM

## 2020-08-30 LAB — URINALYSIS, ROUTINE W REFLEX MICROSCOPIC
Bilirubin, UA: NEGATIVE
Glucose, UA: NEGATIVE
Ketones, UA: NEGATIVE
Leukocytes,UA: NEGATIVE
Nitrite, UA: NEGATIVE
Protein,UA: NEGATIVE
RBC, UA: NEGATIVE
Specific Gravity, UA: 1.015 (ref 1.005–1.030)
Urobilinogen, Ur: 0.2 mg/dL (ref 0.2–1.0)
pH, UA: 7.5 (ref 5.0–7.5)

## 2020-08-30 LAB — BLADDER SCAN AMB NON-IMAGING: Scan Result: 228

## 2020-08-30 NOTE — Progress Notes (Signed)
History of Present Illness:  11.30.2021: Pt here following Urolift procedure (10.18.2021). Pt reports nocturia x3, urinary urgency, and weak FOS. He notes that his urgency and FOS seem to be getting slightly better. He had a single episode of gross hematuria following his procedure and was able to restart his coumadin without issue. He denies any significant dysuria. He has discontinued his tamsulosin and finasteride.   He notes that he was unable to continue his methenamine due to diarrhea.  Preop IPSS 26  QoL score 5  IPSS Questionnaire (AUA-7): Over the past month.   1)  How often have you had a sensation of not emptying your bladder completely after you finish urinating?  3 - About half the time  2)  How often have you had to urinate again less than two hours after you finished urinating? 3 - About half the time  3)  How often have you found you stopped and started again several times when you urinated?  0 - Not at all  4) How difficult have you found it to postpone urination?  3 - About half the time  5) How often have you had a weak urinary stream?  3 - About half the time  6) How often have you had to push or strain to begin urination?  1 - Less than 1 time in 5  7) How many times did you most typically get up to urinate from the time you went to bed until the time you got up in the morning?  3 - 3 times  Total score:  0-7 mildly symptomatic   8-19 moderately symptomatic   20-35 severely symptomatic  QoL Score: 3   (below copied from [facility] records):  8.13.2019: Initial visit for BPH. He had been on Flomax previously but took himself off because of worry of side effects. He did have retention issues following BKA. He had moderate voiding symptomatology had his 1st visit. He was placed on tamsulosin.   11.19.2019: He is voiding better. He has been treated with abx for recent a UTI--presented with dysuria. PVR volume 197 ml. He was continued on tamsulosin but twice daily.    2.11.2020: Stream might be slowing down some. He is not currently taking his tamsulosin twice a day.   8.18.2020: He continues to only take tamsulosin 1x a day. He discontinued finasteride because he was too dizzy while taking it. He has fallen but this was mostly due to his previously ill-fitted prosthetic. He is mostly satisfied with his urinary pattern and feels like he is emptying quite well.  8.18.2020William Ferencz is a 79 year-old male established patient who is herefor an enlarged prostate follow-up evaluation.He is currently taking tamsulosin and rapaflo.  8.17.2021: Pt presents with significant urinary symptoms as a result of his BPH. Pt experiencing urgencyoccasionalincontinence. He does have limited mobility due to his BKA. Pt reports that his FOS has diminished in the last month. Pt denies any recent symptomatic UTIs. Pt denies any constipation.  9.14.2021: He comes in today for follow-up of recent prostate ultrasound done for volume.  This measured 67 mL.  They would like to proceed with intervention for his BPH, and to discuss UroLift versus TURP.  Past Medical History:  Diagnosis Date  . Aortic stenosis   . Arthritis   . BPH (benign prostatic hyperplasia)   . Essential hypertension   . GERD (gastroesophageal reflux disease)   . History of DVT (deep vein thrombosis)    Positive anticardiolipin  .  History of hiatal hernia   . History of stroke 2010  . Lupus (HCC)   . PAD (peripheral artery disease) (HCC)    Right below-knee amputation in 2019  . Shingles   . Tinnitus   . Wears dentures   . Wears glasses     Past Surgical History:  Procedure Laterality Date  . ABDOMINAL AORTOGRAM W/LOWER EXTREMITY N/A 02/17/2018   Procedure: ABDOMINAL AORTOGRAM W/LOWER EXTREMITY;  Surgeon: Maeola Harman, MD;  Location: [facility] INVASIVE CV LAB;  Service: Cardiovascular;  Laterality: N/A;  . AMPUTATION Right 03/03/2018   Procedure: AMPUTATION RIGHT SECOND TOE;   Surgeon: Sherren Kerns, MD;  Location: [facility] OR;  Service: Vascular;  Laterality: Right;  . AMPUTATION Right 03/18/2018   Procedure: AMPUTATION BELOW KNEE;  Surgeon: Sherren Kerns, MD;  Location: [facility] OR;  Service: Vascular;  Laterality: Right;  . BELOW KNEE LEG AMPUTATION Right 03/18/2018  . bilateral cataract surgery     . BIOPSY N/A 10/22/2017   Procedure: BIOPSY;  Surgeon: Franky Macho, MD;  [facility];  Service: Gastroenterology;  Laterality: N/A;  . CATARACT EXTRACTION Bilateral 09/20/2016  . COLONOSCOPY N/A 09/18/2016   Dr. Lovell Sheehan: normal  . ESOPHAGOGASTRODUODENOSCOPY N/A 10/22/2017   Dr. Lovell Sheehan: normal duodenum, gastric mucosal atrophy but negative H.pylori (Clotest negative), normal GE junction  . GIVENS CAPSULE STUDY N/A 01/27/2018   Procedure: GIVENS CAPSULE STUDY;  Surgeon: Corbin Ade, MD;  Location: [facility];  Service: Endoscopy;  Laterality: N/A;  7:00am  . HERNIA REPAIR     2010/laparoscopic/right  . IVC FILTER INSERTION     01/2013   . KNEE ARTHROSCOPY Right    2007  . MULTIPLE TOOTH EXTRACTIONS    . PERIPHERAL VASCULAR BALLOON ANGIOPLASTY  02/17/2018   Procedure: PERIPHERAL VASCULAR BALLOON ANGIOPLASTY;  Surgeon: Maeola Harman, MD;  Location: [facility] INVASIVE CV LAB;  Service: Cardiovascular;;  . TOTAL HIP ARTHROPLASTY Right 05/01/2017   Procedure: RIGHT TOTAL HIP ARTHROPLASTY ANTERIOR APPROACH;  Surgeon: Ollen Gross, MD;  Location: [facility];  Service: Orthopedics;  Laterality: Right;  . TOTAL KNEE ARTHROPLASTY Left 12/13/2014   Procedure: LEFT TOTAL KNEE ARTHROPLASTY;  Surgeon: Ollen Gross, MD;  Location: [facility];  Service: Orthopedics;  Laterality: Left;  . TOTAL KNEE ARTHROPLASTY Right 03/21/2015   Procedure: RIGHT TOTAL KNEE ARTHROPLASTY;  Surgeon: Ollen Gross, MD;  Location: [facility] OR[facility];  Service: Orthopedics;  Laterality: Right;    Home Medications:  Allergies as of 08/30/2020      Reactions   Sulfa Antibiotics       Medication List        Accurate as of August 30, 2020 10:33 AM. If you have any questions, ask your nurse or doctor.        acetaminophen 500 MG tablet Commonly known as: TYLENOL Take 500 mg by mouth every 6 (six) hours as needed for moderate pain or headache.   ciprofloxacin 250 MG tablet Commonly known as: Cipro Take 1 tablet (250 mg total) by mouth 2 (two) times daily.   ciprofloxacin 500 MG tablet Commonly known as: CIPRO Take 1 tablet (500 mg total) by mouth every 12 (twelve) hours.   docusate sodium 100 MG capsule Commonly known as: COLACE Take 100 mg by mouth daily.   ESTER C PO Take 500 mg by mouth daily.   feeding supplement Liqd Take 237 mLs by mouth 2 (two) times daily between meals.   lactose free nutrition Liqd Take 237 mLs by mouth 3 (three) times  daily with meals.   finasteride 5 MG tablet Commonly known as: PROSCAR   hydroxychloroquine 200 MG tablet Commonly known as: PLAQUENIL Take 200 mg by mouth 2 (two) times daily.   ICY HOT EX Apply 1 application topically 2 (two) times daily as needed (left knee.).   lisinopril-hydrochlorothiazide 10-12.5 MG tablet Commonly known as: ZESTORETIC Take 1 tablet by mouth daily with breakfast.   methenamine 1 g tablet Commonly known as: MANDELAMINE   methenamine 1 g tablet Commonly known as: HIPREX Take 1 tablet (1 g total) by mouth 2 (two) times daily with a meal.   multivitamin with minerals Tabs tablet Take 1 tablet by mouth daily. Centrum Silver   OMEGA-3 KRILL OIL PO Take 1 capsule by mouth daily.   omeprazole 20 MG capsule Commonly known as: PRILOSEC Take 20 mg by mouth daily.   predniSONE 5 MG tablet Commonly known as: DELTASONE   tamsulosin 0.4 MG Caps capsule Commonly known as: FLOMAX Take 2 capsules (0.8 mg total) by mouth daily.   URINOZINC PO Take 1 capsule by mouth 2 (two) times daily.   warfarin 5 MG tablet Commonly known as: COUMADIN Take 1 tablet (5 mg total) by mouth every morning. Take  Coumadin for three weeks for postoperative protocol and then the patient may resume their previous Coumadin home regimen.  The dose may need to be adjusted based upon the INR.  Please follow the INR and titrate Coumadin dose for a therapeutic range between 2.0 and 3.0 INR.  After completing the three weeks of Coumadin, the patient may resume their previous Coumadin home regimen. What changed:   when to take this  additional instructions       Allergies:  Allergies  Allergen Reactions  . Sulfa Antibiotics     Family History  Problem Relation Age of Onset  . Hypertension Other   . Stroke Mother   . Stroke Father   . Heart failure Sister   . CAD Brother        CABG age 109  . Colon cancer Neg Hx     Social History:  reports that he quit smoking about 36 years ago. His smoking use included cigarettes. He has a 20.00 pack-year smoking history. He has never used smokeless tobacco. He reports that he does not drink alcohol and does not use drugs.  ROS: A complete review of systems was performed.  All systems are negative except for pertinent findings as noted.  Physical Exam:  Vital signs in last 24 hours: There were no vitals taken for this visit. Constitutional:  Alert and oriented, No acute distress Cardiovascular: Regular rate  Respiratory: Normal respiratory effort Neurologic: Grossly intact, no focal deficits Psychiatric: Normal mood and affect  I have reviewed prior pt notes  I have reviewed notes from referring/previous physicians  I have reviewed urinalysis results  I have independently reviewed prior imaging/PVR volumes  I have reviewed prior PSA results    Impression/Assessment:  BPH w/ LUTS - PVR  228 mLs. UA shows that urine is clear. Still bothersome symptomatically but IPSS shows that pt is trending toward improvement while off of medication.  Plan:  1. Pt advised and reassured regarding his symptom profile and encouraged regarding   2. F/U in 2  months for OV and symptom recheck.  CC: Dr. Selinda Flavin

## 2020-09-02 DIAGNOSIS — D649 Anemia, unspecified: Secondary | ICD-10-CM | POA: Diagnosis not present

## 2020-09-02 DIAGNOSIS — I4891 Unspecified atrial fibrillation: Secondary | ICD-10-CM | POA: Diagnosis not present

## 2020-09-02 DIAGNOSIS — G459 Transient cerebral ischemic attack, unspecified: Secondary | ICD-10-CM | POA: Diagnosis not present

## 2020-09-02 DIAGNOSIS — D529 Folate deficiency anemia, unspecified: Secondary | ICD-10-CM | POA: Diagnosis not present

## 2020-09-02 DIAGNOSIS — D519 Vitamin B12 deficiency anemia, unspecified: Secondary | ICD-10-CM | POA: Diagnosis not present

## 2020-09-22 DIAGNOSIS — D519 Vitamin B12 deficiency anemia, unspecified: Secondary | ICD-10-CM | POA: Diagnosis not present

## 2020-09-22 DIAGNOSIS — Z89511 Acquired absence of right leg below knee: Secondary | ICD-10-CM | POA: Diagnosis not present

## 2020-09-22 DIAGNOSIS — G459 Transient cerebral ischemic attack, unspecified: Secondary | ICD-10-CM | POA: Diagnosis not present

## 2020-09-22 DIAGNOSIS — D529 Folate deficiency anemia, unspecified: Secondary | ICD-10-CM | POA: Diagnosis not present

## 2020-09-22 DIAGNOSIS — D649 Anemia, unspecified: Secondary | ICD-10-CM | POA: Diagnosis not present

## 2020-09-22 DIAGNOSIS — I4891 Unspecified atrial fibrillation: Secondary | ICD-10-CM | POA: Diagnosis not present

## 2020-09-28 DIAGNOSIS — R29898 Other symptoms and signs involving the musculoskeletal system: Secondary | ICD-10-CM | POA: Diagnosis not present

## 2020-09-28 DIAGNOSIS — D6861 Antiphospholipid syndrome: Secondary | ICD-10-CM | POA: Diagnosis not present

## 2020-09-28 DIAGNOSIS — Z79899 Other long term (current) drug therapy: Secondary | ICD-10-CM | POA: Diagnosis not present

## 2020-09-28 DIAGNOSIS — M329 Systemic lupus erythematosus, unspecified: Secondary | ICD-10-CM | POA: Diagnosis not present

## 2020-09-28 DIAGNOSIS — M2550 Pain in unspecified joint: Secondary | ICD-10-CM | POA: Diagnosis not present

## 2020-09-28 DIAGNOSIS — R5382 Chronic fatigue, unspecified: Secondary | ICD-10-CM | POA: Diagnosis not present

## 2020-09-28 DIAGNOSIS — M150 Primary generalized (osteo)arthritis: Secondary | ICD-10-CM | POA: Diagnosis not present

## 2020-09-28 DIAGNOSIS — R011 Cardiac murmur, unspecified: Secondary | ICD-10-CM | POA: Diagnosis not present

## 2020-09-28 DIAGNOSIS — R42 Dizziness and giddiness: Secondary | ICD-10-CM | POA: Diagnosis not present

## 2020-09-30 DIAGNOSIS — Z87891 Personal history of nicotine dependence: Secondary | ICD-10-CM | POA: Diagnosis not present

## 2020-09-30 DIAGNOSIS — M328 Other forms of systemic lupus erythematosus: Secondary | ICD-10-CM | POA: Diagnosis not present

## 2020-09-30 DIAGNOSIS — I70229 Atherosclerosis of native arteries of extremities with rest pain, unspecified extremity: Secondary | ICD-10-CM | POA: Diagnosis not present

## 2020-09-30 DIAGNOSIS — I10 Essential (primary) hypertension: Secondary | ICD-10-CM | POA: Diagnosis not present

## 2020-10-20 DIAGNOSIS — I4891 Unspecified atrial fibrillation: Secondary | ICD-10-CM | POA: Diagnosis not present

## 2020-10-20 DIAGNOSIS — Z13220 Encounter for screening for lipoid disorders: Secondary | ICD-10-CM | POA: Diagnosis not present

## 2020-10-20 DIAGNOSIS — N400 Enlarged prostate without lower urinary tract symptoms: Secondary | ICD-10-CM | POA: Diagnosis not present

## 2020-10-20 DIAGNOSIS — M25559 Pain in unspecified hip: Secondary | ICD-10-CM | POA: Diagnosis not present

## 2020-10-20 DIAGNOSIS — R300 Dysuria: Secondary | ICD-10-CM | POA: Diagnosis not present

## 2020-10-20 DIAGNOSIS — Z6832 Body mass index (BMI) 32.0-32.9, adult: Secondary | ICD-10-CM | POA: Diagnosis not present

## 2020-10-20 DIAGNOSIS — M329 Systemic lupus erythematosus, unspecified: Secondary | ICD-10-CM | POA: Diagnosis not present

## 2020-10-20 DIAGNOSIS — Z1329 Encounter for screening for other suspected endocrine disorder: Secondary | ICD-10-CM | POA: Diagnosis not present

## 2020-10-25 ENCOUNTER — Other Ambulatory Visit: Payer: Self-pay

## 2020-10-25 ENCOUNTER — Ambulatory Visit (INDEPENDENT_AMBULATORY_CARE_PROVIDER_SITE_OTHER): Payer: Medicare HMO | Admitting: Urology

## 2020-10-25 ENCOUNTER — Encounter: Payer: Self-pay | Admitting: Urology

## 2020-10-25 VITALS — BP 184/75 | HR 59 | Temp 97.8°F

## 2020-10-25 DIAGNOSIS — N390 Urinary tract infection, site not specified: Secondary | ICD-10-CM | POA: Diagnosis not present

## 2020-10-25 LAB — URINALYSIS, ROUTINE W REFLEX MICROSCOPIC
Bilirubin, UA: NEGATIVE
Glucose, UA: NEGATIVE
Ketones, UA: NEGATIVE
Leukocytes,UA: NEGATIVE
Nitrite, UA: NEGATIVE
Protein,UA: NEGATIVE
RBC, UA: NEGATIVE
Specific Gravity, UA: 1.015 (ref 1.005–1.030)
Urobilinogen, Ur: 0.2 mg/dL (ref 0.2–1.0)
pH, UA: 7 (ref 5.0–7.5)

## 2020-10-25 LAB — BLADDER SCAN AMB NON-IMAGING: Scan Result: 144

## 2020-10-25 MED ORDER — CIPROFLOXACIN HCL 500 MG PO TABS
500.0000 mg | ORAL_TABLET | Freq: Once | ORAL | Status: AC
  Administered 2020-10-25: 500 mg via ORAL

## 2020-10-25 NOTE — Progress Notes (Signed)
Urological Symptom Review  Patient is experiencing the following symptoms: Frequent urination Hard to postpone urination Burning/pain with urination Get up at night to urinate Leakage of urine Trouble starting stream Weak stream   Review of Systems  Gastrointestinal (upper)  : Negative for upper GI symptoms  Gastrointestinal (lower) : Negative for lower GI symptoms  Constitutional : Negative for symptoms  Skin: Negative for skin symptoms  Eyes: Negative for eye symptoms  Ear/Nose/Throat : Negative for Ear/Nose/Throat symptoms  Hematologic/Lymphatic: Negative for Hematologic/Lymphatic symptoms  Cardiovascular : Leg swelling  Respiratory : Negative for respiratory symptoms  Endocrine: Negative for endocrine symptoms  Musculoskeletal: Joint pain  Neurological: Negative for neurological symptoms  Psychologic: Negative for psychiatric symptoms

## 2020-10-25 NOTE — Progress Notes (Signed)
H&P  Chief Complaint: Difficulty voiding  History of Present Illness: This man comes back for urinary symptom check.  He underwent UroLift procedure in September 2021.  He did have a prostate that was just under 70 g in size.  He still has urinary frequency, urgency, nocturia x3, urgency incontinence.  He wears a pull-up.  He occasionally takes tamsulosin.  Residual urine volume when last checked 2 months ago was 228 mL.  He does state that he thinks he is worse off than prior to the UroLift.  He did have frequent urinary tract infections.  He was placed on Methenamine for his chronic urinary tract infections.  He stopped this because of lower GI issues.  Past Medical History:  Diagnosis Date  . Aortic stenosis   . Arthritis   . BPH (benign prostatic hyperplasia)   . Essential hypertension   . GERD (gastroesophageal reflux disease)   . History of DVT (deep vein thrombosis)    Positive anticardiolipin  . History of hiatal hernia   . History of stroke 2010  . Lupus (HCC)   . PAD (peripheral artery disease) (HCC)    Right below-knee amputation in 2019  . Shingles   . Tinnitus   . Wears dentures   . Wears glasses     Past Surgical History:  Procedure Laterality Date  . ABDOMINAL AORTOGRAM W/LOWER EXTREMITY N/A 02/17/2018   Procedure: ABDOMINAL AORTOGRAM W/LOWER EXTREMITY;  Surgeon: Maeola Harman, MD;  Location: [facility] INVASIVE CV LAB;  Service: Cardiovascular;  Laterality: N/A;  . AMPUTATION Right 03/03/2018   Procedure: AMPUTATION RIGHT SECOND TOE;  Surgeon: Sherren Kerns, MD;  Location: [facility] OR;  Service: Vascular;  Laterality: Right;  . AMPUTATION Right 03/18/2018   Procedure: AMPUTATION BELOW KNEE;  Surgeon: Sherren Kerns, MD;  Location: [facility] OR;  Service: Vascular;  Laterality: Right;  . BELOW KNEE LEG AMPUTATION Right 03/18/2018  . bilateral cataract surgery     . BIOPSY N/A 10/22/2017   Procedure: BIOPSY;  Surgeon: Franky Macho, MD;  [facility];   Service: Gastroenterology;  Laterality: N/A;  . CATARACT EXTRACTION Bilateral 09/20/2016  . COLONOSCOPY N/A 09/18/2016   Dr. Lovell Sheehan: normal  . ESOPHAGOGASTRODUODENOSCOPY N/A 10/22/2017   Dr. Lovell Sheehan: normal duodenum, gastric mucosal atrophy but negative H.pylori (Clotest negative), normal GE junction  . GIVENS CAPSULE STUDY N/A 01/27/2018   Procedure: GIVENS CAPSULE STUDY;  Surgeon: Corbin Ade, MD;  Location: [facility];  Service: Endoscopy;  Laterality: N/A;  7:00am  . HERNIA REPAIR     2010/laparoscopic/right  . IVC FILTER INSERTION     01/2013   . KNEE ARTHROSCOPY Right    2007  . MULTIPLE TOOTH EXTRACTIONS    . PERIPHERAL VASCULAR BALLOON ANGIOPLASTY  02/17/2018   Procedure: PERIPHERAL VASCULAR BALLOON ANGIOPLASTY;  Surgeon: Maeola Harman, MD;  Location: [facility] INVASIVE CV LAB;  Service: Cardiovascular;;  . TOTAL HIP ARTHROPLASTY Right 05/01/2017   Procedure: RIGHT TOTAL HIP ARTHROPLASTY ANTERIOR APPROACH;  Surgeon: Ollen Gross, MD;  Location: [facility];  Service: Orthopedics;  Laterality: Right;  . TOTAL KNEE ARTHROPLASTY Left 12/13/2014   Procedure: LEFT TOTAL KNEE ARTHROPLASTY;  Surgeon: Ollen Gross, MD;  Location: [facility];  Service: Orthopedics;  Laterality: Left;  . TOTAL KNEE ARTHROPLASTY Right 03/21/2015   Procedure: RIGHT TOTAL KNEE ARTHROPLASTY;  Surgeon: Ollen Gross, MD;  Location: [facility];  Service: Orthopedics;  Laterality: Right;    Home Medications:  Allergies as of 10/25/2020      Reactions  Sulfa Antibiotics       Medication List       Accurate as of October 25, 2020 11:45 AM. If you have any questions, ask your nurse or doctor.        STOP taking these medications   ciprofloxacin 250 MG tablet Commonly known as: Cipro Stopped by: Chelsea Aus, MD   ciprofloxacin 500 MG tablet Commonly known as: CIPRO Stopped by: Chelsea Aus, MD   feeding supplement Liqd Stopped by: Chelsea Aus, MD   lactose free nutrition  Liqd Stopped by: Chelsea Aus, MD   methenamine 1 g tablet Commonly known as: MANDELAMINE Stopped by: Chelsea Aus, MD   methenamine 1 g tablet Commonly known as: HIPREX Stopped by: Chelsea Aus, MD     TAKE these medications   acetaminophen 500 MG tablet Commonly known as: TYLENOL Take 500 mg by mouth every 6 (six) hours as needed for moderate pain or headache.   docusate sodium 100 MG capsule Commonly known as: COLACE Take 100 mg by mouth daily.   ESTER C PO Take 500 mg by mouth daily.   finasteride 5 MG tablet Commonly known as: PROSCAR   hydroxychloroquine 200 MG tablet Commonly known as: PLAQUENIL Take 200 mg by mouth 2 (two) times daily.   ICY HOT EX Apply 1 application topically 2 (two) times daily as needed (left knee.).   lisinopril-hydrochlorothiazide 10-12.5 MG tablet Commonly known as: ZESTORETIC Take 1 tablet by mouth daily with breakfast.   multivitamin with minerals Tabs tablet Take 1 tablet by mouth daily. Centrum Silver   OMEGA-3 KRILL OIL PO Take 1 capsule by mouth daily.   omeprazole 20 MG capsule Commonly known as: PRILOSEC Take 20 mg by mouth daily.   predniSONE 5 MG tablet Commonly known as: DELTASONE   tamsulosin 0.4 MG Caps capsule Commonly known as: FLOMAX Take 2 capsules (0.8 mg total) by mouth daily.   URINOZINC PO Take 1 capsule by mouth 2 (two) times daily.   warfarin 5 MG tablet Commonly known as: COUMADIN Take 1 tablet (5 mg total) by mouth every morning. Take Coumadin for three weeks for postoperative protocol and then the patient may resume their previous Coumadin home regimen.  The dose may need to be adjusted based upon the INR.  Please follow the INR and titrate Coumadin dose for a therapeutic range between 2.0 and 3.0 INR.  After completing the three weeks of Coumadin, the patient may resume their previous Coumadin home regimen. What changed:   when to take this  additional instructions        Allergies:  Allergies  Allergen Reactions  . Sulfa Antibiotics     Family History  Problem Relation Age of Onset  . Hypertension Other   . Stroke Mother   . Stroke Father   . Heart failure Sister   . CAD Brother        CABG age 102  . Colon cancer Neg Hx     Social History:  reports that he quit smoking about 37 years ago. His smoking use included cigarettes. He has a 20.00 pack-year smoking history. He has never used smokeless tobacco. He reports that he does not drink alcohol and does not use drugs.  ROS: A complete review of systems was performed.  All systems are negative except for pertinent findings as noted.  Physical Exam:  Vital signs in last 24 hours: BP (!) 184/75   Pulse (!) 59   Temp 97.8  F (36.6 C)  Constitutional:  Alert and oriented, No acute distress Cardiovascular: Regular rate  Respiratory: Normal respiratory effort Neurologic: Grossly intact, no focal deficits Psychiatric: Normal mood and affect  I have reviewed prior pt notes  I have reviewed notes from referring/previous physicians  I have reviewed urinalysis results  I have reviewed prior PSA results  I have reviewed prior urine culture  Cystoscopy Procedure Note:  Indication: Continued lower urinary tract symptoms  After informed consent and discussion of the procedure and its risks, Jose Bush was positioned and prepped in the standard fashion.  Cystoscopy was performed with a flexible cystoscope.   Findings: Urethra: No stricture Prostate: Still with bilobar hypertrophy and obstruction Bladder neck: Normal Ureteral orifices: Normal Bladder: Moderate trabeculations.  No urothelial abnormalities  The patient tolerated the procedure well.  Flow rate was then performed.  The patient voided 108 mL.  Maximum flow 6, average flow 4 mL a second.  Voiding time 41 seconds.  There was an obstructive curve.     Impression/Assessment:  BPH with persistent obstruction,  significant symptomatology  Plan:  I had a long talk with the patient and his wife.  We did talk about repeat procedure i.e. UroLift versus TURP.  I think either would work, although I think at this point TURP is more definitive.  I would suggest we look towards doing that in [place] at [facility], outpatient procedure with overnight stay in the hospital, main OR.  We will get clearance from Dr. Diona Browner to have this done.  They were given a brochure on TURP

## 2020-10-27 DIAGNOSIS — Z6833 Body mass index (BMI) 33.0-33.9, adult: Secondary | ICD-10-CM | POA: Diagnosis not present

## 2020-10-27 DIAGNOSIS — M25559 Pain in unspecified hip: Secondary | ICD-10-CM | POA: Diagnosis not present

## 2020-10-27 DIAGNOSIS — I4891 Unspecified atrial fibrillation: Secondary | ICD-10-CM | POA: Diagnosis not present

## 2020-10-27 DIAGNOSIS — M329 Systemic lupus erythematosus, unspecified: Secondary | ICD-10-CM | POA: Diagnosis not present

## 2020-10-27 DIAGNOSIS — N400 Enlarged prostate without lower urinary tract symptoms: Secondary | ICD-10-CM | POA: Diagnosis not present

## 2020-10-29 DIAGNOSIS — M328 Other forms of systemic lupus erythematosus: Secondary | ICD-10-CM | POA: Diagnosis not present

## 2020-10-29 DIAGNOSIS — I10 Essential (primary) hypertension: Secondary | ICD-10-CM | POA: Diagnosis not present

## 2020-10-29 DIAGNOSIS — I70229 Atherosclerosis of native arteries of extremities with rest pain, unspecified extremity: Secondary | ICD-10-CM | POA: Diagnosis not present

## 2020-10-29 DIAGNOSIS — Z87891 Personal history of nicotine dependence: Secondary | ICD-10-CM | POA: Diagnosis not present

## 2020-11-01 ENCOUNTER — Telehealth: Payer: Self-pay | Admitting: Cardiology

## 2020-11-01 NOTE — Telephone Encounter (Signed)
   [facility] Pre-operative Risk Assessment    HEARTCARE STAFF: - Please ensure there is not already an duplicate clearance open for this procedure. - Under Visit Info/Reason for Call, type in Other and utilize the format Clearance MM/DD/YY or Clearance TBD. Do not use dashes or single digits. - If request is for dental extraction, please clarify the # of teeth to be extracted.  Request for surgical clearance:  1. What type of surgery is being performed?  1. TURP  2. When is this surgery scheduled?   1. Pending  3. What type of clearance is required (medical clearance vs. Pharmacy clearance to hold med vs. Both)?  1. Pharmacy  4. Are there any medications that need to be held prior to surgery and how long? 1. Can patient stop Coumadin 5 days prior to surgery  5. Practice name and name of physician performing surgery?  1. [facility] Dr. Franchot Gallo  6. What is the office phone number?  1. [phone]   7.   What is the office fax number?   1.   [phone]  8.   Anesthesia type (None, local, MAC, general) ?  1.   Does not state   Desma Paganini 11/01/2020, 11:48 AM  _________________________________________________________________   (provider comments below)  \

## 2020-11-01 NOTE — Telephone Encounter (Addendum)
Patient with history of DVT and IVC filter due to hypercoaguable state (positive antiphospholipid (anticardiolipin) syndrome disorder) on Coumadin for anticoagulation.    Patient also has history of stroke in 2010.  Procedure: transurethral resection of the prostate (TURP) Date of procedure: TBD  CrCl 90 ml/min (using adjusted weight) Platelet count 158K  Per office protocol, patient can hold Coumadin for 5 days prior to procedure.    Patient will need bridging with Lovenox (enoxaparin) around procedure.  Coumadin is managed by PCP who should be able to provide assistance with Lovenox bridging.

## 2020-11-01 NOTE — Telephone Encounter (Signed)
   Primary Cardiologist: Nona Dell, MD  Chart reviewed and patient contacted by phone today as part of pre-operative protocol coverage. Given past medical history and time since last visit, based on ACC/AHA guidelines, Jose Bush would be at acceptable risk for the planned procedure without further cardiovascular testing.   His Coumadin is handled by his primary care provider and the patient will contact him regarding Lovenox crossover.  The patient was advised that if he develops new symptoms prior to surgery to contact our office to arrange for a follow-up visit, and he verbalized understanding.  I will route this recommendation to the requesting party via Epic fax function and remove from pre-op pool.  Please call with questions.  Corine Shelter, PA-C 11/01/2020, 2:05 PM

## 2020-11-01 NOTE — Telephone Encounter (Signed)
Pharmacy can you contact this patient for Lovenox bridging which it appears he has required in the past.  Corine Shelter PA-C 11/01/2020 1:21 PM

## 2020-11-02 DIAGNOSIS — M25552 Pain in left hip: Secondary | ICD-10-CM | POA: Insufficient documentation

## 2020-11-03 DIAGNOSIS — Z96641 Presence of right artificial hip joint: Secondary | ICD-10-CM | POA: Diagnosis not present

## 2020-11-03 DIAGNOSIS — M1612 Unilateral primary osteoarthritis, left hip: Secondary | ICD-10-CM | POA: Diagnosis not present

## 2020-11-08 DIAGNOSIS — Z6832 Body mass index (BMI) 32.0-32.9, adult: Secondary | ICD-10-CM | POA: Diagnosis not present

## 2020-11-08 DIAGNOSIS — N400 Enlarged prostate without lower urinary tract symptoms: Secondary | ICD-10-CM | POA: Diagnosis not present

## 2020-11-08 DIAGNOSIS — M25559 Pain in unspecified hip: Secondary | ICD-10-CM | POA: Diagnosis not present

## 2020-11-08 DIAGNOSIS — M329 Systemic lupus erythematosus, unspecified: Secondary | ICD-10-CM | POA: Diagnosis not present

## 2020-11-08 DIAGNOSIS — I4891 Unspecified atrial fibrillation: Secondary | ICD-10-CM | POA: Diagnosis not present

## 2020-11-10 ENCOUNTER — Ambulatory Visit: Payer: Medicare HMO | Admitting: Vascular Surgery

## 2020-11-10 ENCOUNTER — Other Ambulatory Visit: Payer: Self-pay

## 2020-11-10 ENCOUNTER — Encounter: Payer: Self-pay | Admitting: Vascular Surgery

## 2020-11-10 ENCOUNTER — Other Ambulatory Visit: Payer: Self-pay | Admitting: Urology

## 2020-11-10 ENCOUNTER — Ambulatory Visit (HOSPITAL_COMMUNITY)
Admission: RE | Admit: 2020-11-10 | Discharge: 2020-11-10 | Disposition: A | Discharge status: Home / Self Care | Payer: Medicare HMO | Source: Ambulatory Visit | Attending: Vascular Surgery | Admitting: Vascular Surgery

## 2020-11-10 VITALS — BP 147/76 | HR 69 | Temp 98.1°F | Resp 20 | Ht 68.0 in | Wt 210.0 lb

## 2020-11-10 DIAGNOSIS — I739 Peripheral vascular disease, unspecified: Secondary | ICD-10-CM | POA: Diagnosis not present

## 2020-11-10 NOTE — Progress Notes (Signed)
Patient is an 80 year old male who returns for follow-up today. He previously underwent right below-knee amputation in 2019 this was after failed revascularization of the tibial angioplasty. He has known tibial disease bilaterally. He also has a history of lupus.  He currently has numbness and tingling in his left foot which she states may be a little bit worse than his last office visit.  He has no open wound or ulcer.  His toenails are currently trimmed by podiatry Dr. Ulice Brilliant.  He is ambulating on a right below-knee prosthesis.  He is on warfarin.  This is for prior DVT anticardiolipin.  Past Surgical History:  Procedure Laterality Date  . ABDOMINAL AORTOGRAM W/LOWER EXTREMITY N/A 02/17/2018   Procedure: ABDOMINAL AORTOGRAM W/LOWER EXTREMITY;  Surgeon: Maeola Harman, MD;  Location: [facility] INVASIVE CV LAB;  Service: Cardiovascular;  Laterality: N/A;  . AMPUTATION Right 03/03/2018   Procedure: AMPUTATION RIGHT SECOND TOE;  Surgeon: Sherren Kerns, MD;  Location: [facility] OR;  Service: Vascular;  Laterality: Right;  . AMPUTATION Right 03/18/2018   Procedure: AMPUTATION BELOW KNEE;  Surgeon: Sherren Kerns, MD;  Location: [facility] OR;  Service: Vascular;  Laterality: Right;  . BELOW KNEE LEG AMPUTATION Right 03/18/2018  . bilateral cataract surgery     . BIOPSY N/A 10/22/2017   Procedure: BIOPSY;  Surgeon: Franky Macho, MD;  [facility];  Service: Gastroenterology;  Laterality: N/A;  . CATARACT EXTRACTION Bilateral 09/20/2016  . COLONOSCOPY N/A 09/18/2016   Dr. Lovell Sheehan: normal  . ESOPHAGOGASTRODUODENOSCOPY N/A 10/22/2017   Dr. Lovell Sheehan: normal duodenum, gastric mucosal atrophy but negative H.pylori (Clotest negative), normal GE junction  . GIVENS CAPSULE STUDY N/A 01/27/2018   Procedure: GIVENS CAPSULE STUDY;  Surgeon: Corbin Ade, MD;  Location: [facility];  Service: Endoscopy;  Laterality: N/A;  7:00am  . HERNIA REPAIR     2010/laparoscopic/right  . IVC FILTER INSERTION      01/2013   . KNEE ARTHROSCOPY Right    2007  . MULTIPLE TOOTH EXTRACTIONS    . PERIPHERAL VASCULAR BALLOON ANGIOPLASTY  02/17/2018   Procedure: PERIPHERAL VASCULAR BALLOON ANGIOPLASTY;  Surgeon: Maeola Harman, MD;  Location: [facility] INVASIVE CV LAB;  Service: Cardiovascular;;  . TOTAL HIP ARTHROPLASTY Right 05/01/2017   Procedure: RIGHT TOTAL HIP ARTHROPLASTY ANTERIOR APPROACH;  Surgeon: Ollen Gross, MD;  Location: [facility] ORS;  Service: Orthopedics;  Laterality: Right;  . TOTAL KNEE ARTHROPLASTY Left 12/13/2014   Procedure: LEFT TOTAL KNEE ARTHROPLASTY;  Surgeon: Ollen Gross, MD;  Location: [facility] ORS;  Service: Orthopedics;  Laterality: Left;  . TOTAL KNEE ARTHROPLASTY Right 03/21/2015   Procedure: RIGHT TOTAL KNEE ARTHROPLASTY;  Surgeon: Ollen Gross, MD;  Location: [facility] ORS;  Service: Orthopedics;  Laterality: Right;     Past Medical History:  Diagnosis Date  . Aortic stenosis   . Arthritis   . BPH (benign prostatic hyperplasia)   . Essential hypertension   . GERD (gastroesophageal reflux disease)   . History of DVT (deep vein thrombosis)    Positive anticardiolipin  . History of hiatal hernia   . History of stroke 2010  . Lupus (HCC)   . PAD (peripheral artery disease) (HCC)    Right below-knee amputation in 2019  . Shingles   . Tinnitus   . Wears dentures   . Wears glasses      Current Outpatient Medications on File Prior to Visit  Medication Sig Dispense Refill  . acetaminophen (TYLENOL) 500 MG tablet Take 500 mg by mouth every 6 (  six) hours as needed for moderate pain or headache.     Marland Kitchen Bioflavonoid Products (ESTER C PO) Take 500 mg by mouth daily.    Marland Kitchen docusate sodium (COLACE) 100 MG capsule Take 100 mg by mouth daily.    . finasteride (PROSCAR) 5 MG tablet     . hydroxychloroquine (PLAQUENIL) 200 MG tablet Take 200 mg by mouth 2 (two) times daily.    Marland Kitchen lisinopril-hydrochlorothiazide (PRINZIDE,ZESTORETIC) 10-12.5 MG per tablet Take 1 tablet by mouth daily with  breakfast.     . Menthol, Topical Analgesic, (ICY HOT EX) Apply 1 application topically 2 (two) times daily as needed (left knee.).    Marland Kitchen Misc Natural Products (URINOZINC PO) Take 1 capsule by mouth 2 (two) times daily.     . Multiple Vitamin (MULTIVITAMIN WITH MINERALS) TABS tablet Take 1 tablet by mouth daily. Centrum Silver    . OMEGA-3 KRILL OIL PO Take 1 capsule by mouth daily.    Marland Kitchen omeprazole (PRILOSEC) 20 MG capsule Take 20 mg by mouth daily.    . predniSONE (DELTASONE) 5 MG tablet     . tamsulosin (FLOMAX) 0.4 MG CAPS capsule Take 2 capsules (0.8 mg total) by mouth daily. 60 capsule 0  . warfarin (COUMADIN) 5 MG tablet Take 1 tablet (5 mg total) by mouth every morning. Take Coumadin for three weeks for postoperative protocol and then the patient may resume their previous Coumadin home regimen.  The dose may need to be adjusted based upon the INR.  Please follow the INR and titrate Coumadin dose for a therapeutic range between 2.0 and 3.0 INR.  After completing the three weeks of Coumadin, the patient may resume their previous Coumadin home regimen. (Patient taking differently: Take 5 mg by mouth daily.) 35 tablet 0   No current facility-administered medications on file prior to visit.    Physical exam:  Vitals:   11/10/20 1238  BP: (!) 147/76  Pulse: 69  Resp: 20  Temp: 98.1 F (36.7 C)  SpO2: 96%  Weight: 210 lb (95.3 kg)  Height: 5\' 8"  (1.727 m)    Extremities: Left foot no open ulcer or wound brisk capillary refill skin pink and warm no palpable pedal pulses, diffuse woody edema left leg extending to the foot with some hemosiderin staining of the skin  Data: Patient had left leg ABI performed today which showed monophasic flow a toe pressure of 32 and noncompressible vessels.  Assessment: Stable peripheral arterial disease primarily tibial in nature currently no open wound or rest pain has some element of neuropathy left foot  Plan: Patient will continue to protect his left  foot.  He will call if he develops any wounds on the left foot.  Patient will follow up with [place] with a duplex of his left lower extremity left leg ABI and office visit in 6 months.  [place], MD [facility] [place] Office: [phone]

## 2020-11-11 ENCOUNTER — Other Ambulatory Visit: Payer: Self-pay

## 2020-11-11 DIAGNOSIS — M25559 Pain in unspecified hip: Secondary | ICD-10-CM | POA: Diagnosis not present

## 2020-11-11 DIAGNOSIS — R262 Difficulty in walking, not elsewhere classified: Secondary | ICD-10-CM | POA: Diagnosis not present

## 2020-11-11 DIAGNOSIS — M6281 Muscle weakness (generalized): Secondary | ICD-10-CM | POA: Diagnosis not present

## 2020-11-11 DIAGNOSIS — I739 Peripheral vascular disease, unspecified: Secondary | ICD-10-CM

## 2020-11-15 DIAGNOSIS — B351 Tinea unguium: Secondary | ICD-10-CM | POA: Diagnosis not present

## 2020-11-15 DIAGNOSIS — M79676 Pain in unspecified toe(s): Secondary | ICD-10-CM | POA: Diagnosis not present

## 2020-11-15 DIAGNOSIS — I70203 Unspecified atherosclerosis of native arteries of extremities, bilateral legs: Secondary | ICD-10-CM | POA: Diagnosis not present

## 2020-11-16 DIAGNOSIS — M6281 Muscle weakness (generalized): Secondary | ICD-10-CM | POA: Diagnosis not present

## 2020-11-16 DIAGNOSIS — M25559 Pain in unspecified hip: Secondary | ICD-10-CM | POA: Diagnosis not present

## 2020-11-16 DIAGNOSIS — R262 Difficulty in walking, not elsewhere classified: Secondary | ICD-10-CM | POA: Diagnosis not present

## 2020-11-22 DIAGNOSIS — M6281 Muscle weakness (generalized): Secondary | ICD-10-CM | POA: Diagnosis not present

## 2020-11-22 DIAGNOSIS — M25559 Pain in unspecified hip: Secondary | ICD-10-CM | POA: Diagnosis not present

## 2020-11-22 DIAGNOSIS — R262 Difficulty in walking, not elsewhere classified: Secondary | ICD-10-CM | POA: Diagnosis not present

## 2020-11-22 DIAGNOSIS — R531 Weakness: Secondary | ICD-10-CM | POA: Diagnosis not present

## 2020-11-24 DIAGNOSIS — M6281 Muscle weakness (generalized): Secondary | ICD-10-CM | POA: Diagnosis not present

## 2020-11-24 DIAGNOSIS — R531 Weakness: Secondary | ICD-10-CM | POA: Diagnosis not present

## 2020-11-24 DIAGNOSIS — R262 Difficulty in walking, not elsewhere classified: Secondary | ICD-10-CM | POA: Diagnosis not present

## 2020-11-24 DIAGNOSIS — M25559 Pain in unspecified hip: Secondary | ICD-10-CM | POA: Diagnosis not present

## 2020-11-28 DIAGNOSIS — M25559 Pain in unspecified hip: Secondary | ICD-10-CM | POA: Diagnosis not present

## 2020-11-28 DIAGNOSIS — R262 Difficulty in walking, not elsewhere classified: Secondary | ICD-10-CM | POA: Diagnosis not present

## 2020-11-28 DIAGNOSIS — R531 Weakness: Secondary | ICD-10-CM | POA: Diagnosis not present

## 2020-11-28 DIAGNOSIS — M6281 Muscle weakness (generalized): Secondary | ICD-10-CM | POA: Diagnosis not present

## 2020-11-29 DIAGNOSIS — M25552 Pain in left hip: Secondary | ICD-10-CM | POA: Diagnosis not present

## 2020-12-01 DIAGNOSIS — M25559 Pain in unspecified hip: Secondary | ICD-10-CM | POA: Diagnosis not present

## 2020-12-01 DIAGNOSIS — R531 Weakness: Secondary | ICD-10-CM | POA: Diagnosis not present

## 2020-12-01 DIAGNOSIS — R262 Difficulty in walking, not elsewhere classified: Secondary | ICD-10-CM | POA: Diagnosis not present

## 2020-12-01 DIAGNOSIS — M6281 Muscle weakness (generalized): Secondary | ICD-10-CM | POA: Diagnosis not present

## 2020-12-05 DIAGNOSIS — R262 Difficulty in walking, not elsewhere classified: Secondary | ICD-10-CM | POA: Diagnosis not present

## 2020-12-05 DIAGNOSIS — M25559 Pain in unspecified hip: Secondary | ICD-10-CM | POA: Diagnosis not present

## 2020-12-05 DIAGNOSIS — R531 Weakness: Secondary | ICD-10-CM | POA: Diagnosis not present

## 2020-12-05 DIAGNOSIS — M6281 Muscle weakness (generalized): Secondary | ICD-10-CM | POA: Diagnosis not present

## 2020-12-06 DIAGNOSIS — I4891 Unspecified atrial fibrillation: Secondary | ICD-10-CM | POA: Diagnosis not present

## 2020-12-08 DIAGNOSIS — R262 Difficulty in walking, not elsewhere classified: Secondary | ICD-10-CM | POA: Diagnosis not present

## 2020-12-08 DIAGNOSIS — M6281 Muscle weakness (generalized): Secondary | ICD-10-CM | POA: Diagnosis not present

## 2020-12-08 DIAGNOSIS — R531 Weakness: Secondary | ICD-10-CM | POA: Diagnosis not present

## 2020-12-08 DIAGNOSIS — M25559 Pain in unspecified hip: Secondary | ICD-10-CM | POA: Diagnosis not present

## 2020-12-12 ENCOUNTER — Ambulatory Visit: Admit: 2020-12-12 | Payer: Medicare HMO | Admitting: Urology

## 2020-12-12 ENCOUNTER — Telehealth: Payer: Self-pay | Admitting: Urology

## 2020-12-12 DIAGNOSIS — M6281 Muscle weakness (generalized): Secondary | ICD-10-CM | POA: Diagnosis not present

## 2020-12-12 DIAGNOSIS — M25559 Pain in unspecified hip: Secondary | ICD-10-CM | POA: Diagnosis not present

## 2020-12-12 DIAGNOSIS — R262 Difficulty in walking, not elsewhere classified: Secondary | ICD-10-CM | POA: Diagnosis not present

## 2020-12-12 DIAGNOSIS — L97509 Non-pressure chronic ulcer of other part of unspecified foot with unspecified severity: Secondary | ICD-10-CM | POA: Diagnosis not present

## 2020-12-12 DIAGNOSIS — R300 Dysuria: Secondary | ICD-10-CM | POA: Diagnosis not present

## 2020-12-12 DIAGNOSIS — R531 Weakness: Secondary | ICD-10-CM | POA: Diagnosis not present

## 2020-12-12 DIAGNOSIS — Z6832 Body mass index (BMI) 32.0-32.9, adult: Secondary | ICD-10-CM | POA: Diagnosis not present

## 2020-12-12 SURGERY — TURP (TRANSURETHRAL RESECTION OF PROSTATE)
Anesthesia: General

## 2020-12-13 ENCOUNTER — Telehealth: Payer: Self-pay

## 2020-12-13 NOTE — Telephone Encounter (Signed)
Wife called to report new wound on left second toe - it has been there for a few days and she believes it is from his shoe. Patient was seen in February - has history of tibial disease and noncompressible vessel. Added him on for a wound check for CEF.

## 2020-12-14 DIAGNOSIS — R531 Weakness: Secondary | ICD-10-CM | POA: Diagnosis not present

## 2020-12-14 DIAGNOSIS — M25559 Pain in unspecified hip: Secondary | ICD-10-CM | POA: Diagnosis not present

## 2020-12-14 DIAGNOSIS — R262 Difficulty in walking, not elsewhere classified: Secondary | ICD-10-CM | POA: Diagnosis not present

## 2020-12-14 DIAGNOSIS — M6281 Muscle weakness (generalized): Secondary | ICD-10-CM | POA: Diagnosis not present

## 2020-12-15 ENCOUNTER — Other Ambulatory Visit: Payer: Self-pay

## 2020-12-15 ENCOUNTER — Encounter: Payer: Self-pay | Admitting: Vascular Surgery

## 2020-12-15 ENCOUNTER — Ambulatory Visit (INDEPENDENT_AMBULATORY_CARE_PROVIDER_SITE_OTHER): Payer: Medicare HMO | Admitting: Vascular Surgery

## 2020-12-15 VITALS — BP 152/80 | HR 74 | Temp 98.6°F | Resp 20 | Ht 68.0 in | Wt 210.0 lb

## 2020-12-15 DIAGNOSIS — I739 Peripheral vascular disease, unspecified: Secondary | ICD-10-CM

## 2020-12-15 NOTE — Progress Notes (Signed)
Patient is an 80 year old male who returns for follow-up today.  About 7 to 10 days ago he developed a blister and ulceration on the dorsal aspect of his left second toe.  This is probably from where his shoe rubbed.  He was placed on doxycycline antibiotics yesterday as well as Silvadene cream.  Patient has known severe tibial occlusive disease.  Most of his disease is below the ankle in his left foot.  He previously had a right below-knee amputation for similar disease pattern.  He does not have any fever or chills.  He has not noticed any drainage.  Past Medical History:  Diagnosis Date  . Aortic stenosis   . Arthritis   . BPH (benign prostatic hyperplasia)   . Essential hypertension   . GERD (gastroesophageal reflux disease)   . History of DVT (deep vein thrombosis)    Positive anticardiolipin  . History of hiatal hernia   . History of stroke 2010  . Lupus (HCC)   . PAD (peripheral artery disease) (HCC)    Right below-knee amputation in 2019  . Shingles   . Tinnitus   . Wears dentures   . Wears glasses     Past Surgical History:  Procedure Laterality Date  . ABDOMINAL AORTOGRAM W/LOWER EXTREMITY N/A 02/17/2018   Procedure: ABDOMINAL AORTOGRAM W/LOWER EXTREMITY;  Surgeon: Maeola Harman, MD;  Location: [facility] INVASIVE CV LAB;  Service: Cardiovascular;  Laterality: N/A;  . AMPUTATION Right 03/03/2018   Procedure: AMPUTATION RIGHT SECOND TOE;  Surgeon: Sherren Kerns, MD;  Location: [facility] OR;  Service: Vascular;  Laterality: Right;  . AMPUTATION Right 03/18/2018   Procedure: AMPUTATION BELOW KNEE;  Surgeon: Sherren Kerns, MD;  Location: [facility] OR;  Service: Vascular;  Laterality: Right;  . BELOW KNEE LEG AMPUTATION Right 03/18/2018  . bilateral cataract surgery     . BIOPSY N/A 10/22/2017   Procedure: BIOPSY;  Surgeon: Franky Macho, MD;  [facility];  Service: Gastroenterology;  Laterality: N/A;  . CATARACT EXTRACTION Bilateral 09/20/2016  . COLONOSCOPY N/A  09/18/2016   Dr. Lovell Sheehan: normal  . ESOPHAGOGASTRODUODENOSCOPY N/A 10/22/2017   Dr. Lovell Sheehan: normal duodenum, gastric mucosal atrophy but negative H.pylori (Clotest negative), normal GE junction  . GIVENS CAPSULE STUDY N/A 01/27/2018   Procedure: GIVENS CAPSULE STUDY;  Surgeon: Corbin Ade, MD;  [facility];  Service: Endoscopy;  Laterality: N/A;  7:00am  . HERNIA REPAIR     2010/laparoscopic/right  . IVC FILTER INSERTION     01/2013   . KNEE ARTHROSCOPY Right    2007  . MULTIPLE TOOTH EXTRACTIONS    . PERIPHERAL VASCULAR BALLOON ANGIOPLASTY  02/17/2018   Procedure: PERIPHERAL VASCULAR BALLOON ANGIOPLASTY;  Surgeon: Maeola Harman, MD;  Location: [facility] INVASIVE CV LAB;  Service: Cardiovascular;;  . TOTAL HIP ARTHROPLASTY Right 05/01/2017   Procedure: RIGHT TOTAL HIP ARTHROPLASTY ANTERIOR APPROACH;  Surgeon: Ollen Gross, MD;  Location: [facility];  Service: Orthopedics;  Laterality: Right;  . TOTAL KNEE ARTHROPLASTY Left 12/13/2014   Procedure: LEFT TOTAL KNEE ARTHROPLASTY;  Surgeon: Ollen Gross, MD;  Location: [facility];  Service: Orthopedics;  Laterality: Left;  . TOTAL KNEE ARTHROPLASTY Right 03/21/2015   Procedure: RIGHT TOTAL KNEE ARTHROPLASTY;  Surgeon: Ollen Gross, MD;  Location: [facility];  Service: Orthopedics;  Laterality: Right;   Physical exam:  Vitals:   12/15/20 1501  BP: (!) 152/80  Pulse: 74  Resp: 20  Temp: 98.6 F (37 C)  SpO2: 96%  Weight: 210 lb (  95.3 kg)  Height: 5\' 8"  (1.727 m)    Extremities: Left second toe 9 x 7 mm less than 1 mm depth ulceration with some granulation tissue over the dorsal aspect of the distal interphalangeal joint no surrounding erythema or drainage or fluctuance  Assessment: Patient with new wound left second toe with known tibial artery occlusive disease  Plan: Patient will continue local wound care and his oral antibiotics and I will follow up with him next week.  If the wound shows signs of healing may be no further  interventions necessary.  Otherwise if the wound continues to deteriorate or not improve he may need a repeat arteriogram to see if there is anything we can do to improve his perfusion as previously his right leg deteriorated very rapidly with no revascularization options and he ultimately had a right below-knee amputation.  Hopefully we will avoid this in the left leg.  , MD [facility] [place] Office: [phone]

## 2020-12-16 ENCOUNTER — Other Ambulatory Visit: Payer: Self-pay

## 2020-12-19 DIAGNOSIS — I4891 Unspecified atrial fibrillation: Secondary | ICD-10-CM | POA: Diagnosis not present

## 2020-12-19 DIAGNOSIS — M6281 Muscle weakness (generalized): Secondary | ICD-10-CM | POA: Diagnosis not present

## 2020-12-19 DIAGNOSIS — R262 Difficulty in walking, not elsewhere classified: Secondary | ICD-10-CM | POA: Diagnosis not present

## 2020-12-19 DIAGNOSIS — M25559 Pain in unspecified hip: Secondary | ICD-10-CM | POA: Diagnosis not present

## 2020-12-19 DIAGNOSIS — R531 Weakness: Secondary | ICD-10-CM | POA: Diagnosis not present

## 2020-12-22 ENCOUNTER — Other Ambulatory Visit: Payer: Self-pay

## 2020-12-22 ENCOUNTER — Encounter: Payer: Self-pay | Admitting: Vascular Surgery

## 2020-12-22 ENCOUNTER — Ambulatory Visit: Payer: Medicare HMO | Admitting: Vascular Surgery

## 2020-12-22 VITALS — BP 124/75 | HR 71 | Temp 98.0°F | Resp 20 | Ht 68.0 in | Wt 210.0 lb

## 2020-12-22 DIAGNOSIS — I739 Peripheral vascular disease, unspecified: Secondary | ICD-10-CM

## 2020-12-22 NOTE — H&P (View-Only) (Signed)
Patient is an 80 year old male who returns for follow-up today.  He has now had an ulceration on the dorsal aspect of his left toe for approximately 1 month with no evidence of healing.  He has had a trial of antibiotics.  He is currently using Silvadene cream.  He has known severe tibial artery occlusive disease.  Deviously most of the disease was below the ankle.  He does not have any fever or chills.  He has not noticed any significant Raynaud's.  The wound has not healed or deteriorated.  He is on warfarin for prior DVT.  He has previously had a right below-knee amputation.  Past Medical History:  Diagnosis Date  . Aortic stenosis   . Arthritis   . BPH (benign prostatic hyperplasia)   . Essential hypertension   . GERD (gastroesophageal reflux disease)   . History of DVT (deep vein thrombosis)    Positive anticardiolipin  . History of hiatal hernia   . History of stroke 2010  . Lupus (HCC)   . PAD (peripheral artery disease) (HCC)    Right below-knee amputation in 2019  . Shingles   . Tinnitus   . Wears dentures   . Wears glasses     Past Surgical History:  Procedure Laterality Date  . ABDOMINAL AORTOGRAM W/LOWER EXTREMITY N/A 02/17/2018   Procedure: ABDOMINAL AORTOGRAM W/LOWER EXTREMITY;  Surgeon: Maeola Harman, MD;  Location: [facility] INVASIVE CV LAB;  Service: Cardiovascular;  Laterality: N/A;  . AMPUTATION Right 03/03/2018   Procedure: AMPUTATION RIGHT SECOND TOE;  Surgeon: Sherren Kerns, MD;  Location: [facility] OR;  Service: Vascular;  Laterality: Right;  . AMPUTATION Right 03/18/2018   Procedure: AMPUTATION BELOW KNEE;  Surgeon: Sherren Kerns, MD;  Location: [facility] OR;  Service: Vascular;  Laterality: Right;  . BELOW KNEE LEG AMPUTATION Right 03/18/2018  . bilateral cataract surgery     . BIOPSY N/A 10/22/2017   Procedure: BIOPSY;  Surgeon: Franky Macho, MD;  [facility];  Service: Gastroenterology;  Laterality: N/A;  . CATARACT EXTRACTION Bilateral 09/20/2016   . COLONOSCOPY N/A 09/18/2016   Dr. Lovell Sheehan: normal  . ESOPHAGOGASTRODUODENOSCOPY N/A 10/22/2017   Dr. Lovell Sheehan: normal duodenum, gastric mucosal atrophy but negative H.pylori (Clotest negative), normal GE junction  . GIVENS CAPSULE STUDY N/A 01/27/2018   Procedure: GIVENS CAPSULE STUDY;  Surgeon: Corbin Ade, MD;  [facility]: [facility];  Service: Endoscopy;  Laterality: N/A;  7:00am  . HERNIA REPAIR     2010/laparoscopic/right  . IVC FILTER INSERTION     01/2013   . KNEE ARTHROSCOPY Right    2007  . MULTIPLE TOOTH EXTRACTIONS    . PERIPHERAL VASCULAR BALLOON ANGIOPLASTY  02/17/2018   Procedure: PERIPHERAL VASCULAR BALLOON ANGIOPLASTY;  Surgeon: Maeola Harman, MD;  Location: [facility] INVASIVE CV LAB;  Service: Cardiovascular;;  . TOTAL HIP ARTHROPLASTY Right 05/01/2017   Procedure: RIGHT TOTAL HIP ARTHROPLASTY ANTERIOR APPROACH;  Surgeon: Ollen Gross, MD;  Location: [facility];  Service: Orthopedics;  Laterality: Right;  . TOTAL KNEE ARTHROPLASTY Left 12/13/2014   Procedure: LEFT TOTAL KNEE ARTHROPLASTY;  Surgeon: Ollen Gross, MD;  Location: [facility];  Service: Orthopedics;  Laterality: Left;  . TOTAL KNEE ARTHROPLASTY Right 03/21/2015   Procedure: RIGHT TOTAL KNEE ARTHROPLASTY;  Surgeon: Ollen Gross, MD;  Location: [facility];  Service: Orthopedics;  Laterality: Right;    Current Outpatient Medications on File Prior to Visit  Medication Sig Dispense Refill  . acetaminophen (TYLENOL) 500 MG tablet Take 500 mg  by mouth every 6 (six) hours as needed for moderate pain or headache.     Marland Kitchen Bioflavonoid Products (ESTER C PO) Take 500 mg by mouth daily.    Marland Kitchen docusate sodium (COLACE) 100 MG capsule Take 100 mg by mouth daily.    . finasteride (PROSCAR) 5 MG tablet     . hydroxychloroquine (PLAQUENIL) 200 MG tablet Take 200 mg by mouth 2 (two) times daily.    Marland Kitchen ketoconazole (NIZORAL) 2 % cream ketoconazole 2 % topical cream    . lisinopril-hydrochlorothiazide (PRINZIDE,ZESTORETIC) 10-12.5 MG  per tablet Take 1 tablet by mouth daily with breakfast.     . Menthol, Topical Analgesic, (ICY HOT EX) Apply 1 application topically 2 (two) times daily as needed (left knee.).    Marland Kitchen Misc Natural Products (URINOZINC PO) Take 1 capsule by mouth 2 (two) times daily.     . Multiple Vitamin (MULTIVITAMIN WITH MINERALS) TABS tablet Take 1 tablet by mouth daily. Centrum Silver    . OMEGA-3 KRILL OIL PO Take 1 capsule by mouth daily.    Marland Kitchen omeprazole (PRILOSEC) 20 MG capsule Take 20 mg by mouth daily.    . predniSONE (DELTASONE) 5 MG tablet     . tamsulosin (FLOMAX) 0.4 MG CAPS capsule Take 2 capsules (0.8 mg total) by mouth daily. 60 capsule 0  . traMADol (ULTRAM) 50 MG tablet tramadol 50 mg tablet  TAKE 1 TABLET BY MOUTH EVERY 12 HOURS AS NEEDED    . warfarin (COUMADIN) 5 MG tablet Take 1 tablet (5 mg total) by mouth every morning. Take Coumadin for three weeks for postoperative protocol and then the patient may resume their previous Coumadin home regimen.  The dose may need to be adjusted based upon the INR.  Please follow the INR and titrate Coumadin dose for a therapeutic range between 2.0 and 3.0 INR.  After completing the three weeks of Coumadin, the patient may resume their previous Coumadin home regimen. (Patient taking differently: Take 5 mg by mouth daily.) 35 tablet 0   No current facility-administered medications on file prior to visit.    Physical exam:  Vitals:   12/22/20 1520  BP: 124/75  Pulse: 71  Resp: 20  Temp: 98 F (36.7 C)  SpO2: 97%  Weight: 210 lb (95.3 kg)  Height: 5\' 8"  (1.727 m)    Extremities: No palpable pedal pulses left leg 4 mm ulceration dorsum left second toe  Data: Since previous ABIs and February of this year showed noncompressible vessels with a toe pressure of 32.  Assessment: Nonhealing wound left foot most likely secondary to chronic tibial artery occlusive disease.  Plan: Aortogram with lower extremity runoff possible intervention scheduled for  December 30, 2020.  We will stop his warfarin 3 days prior.  Risk benefits possible complications and procedure details were discussed the patient and his wife today.  He understands agrees to proceed.  January 01, 2021, MD [facility] [place] Office: [phone]

## 2020-12-22 NOTE — Progress Notes (Signed)
Patient is an 80-year-old male who returns for follow-up today.  He has now had an ulceration on the dorsal aspect of his left toe for approximately 1 month with no evidence of healing.  He has had a trial of antibiotics.  He is currently using Silvadene cream.  He has known severe tibial artery occlusive disease.  Deviously most of the disease was below the ankle.  He does not have any fever or chills.  He has not noticed any significant Raynaud's.  The wound has not healed or deteriorated.  He is on warfarin for prior DVT.  He has previously had a right below-knee amputation.  Past Medical History:  Diagnosis Date  . Aortic stenosis   . Arthritis   . BPH (benign prostatic hyperplasia)   . Essential hypertension   . GERD (gastroesophageal reflux disease)   . History of DVT (deep vein thrombosis)    Positive anticardiolipin  . History of hiatal hernia   . History of stroke 2010  . Lupus (HCC)   . PAD (peripheral artery disease) (HCC)    Right below-knee amputation in 2019  . Shingles   . Tinnitus   . Wears dentures   . Wears glasses     Past Surgical History:  Procedure Laterality Date  . ABDOMINAL AORTOGRAM W/LOWER EXTREMITY N/A 02/17/2018   Procedure: ABDOMINAL AORTOGRAM W/LOWER EXTREMITY;  Surgeon: Cain, Brandon Christopher, MD;  Location: [facility];  Service: Cardiovascular;  Laterality: N/A;  . AMPUTATION Right 03/03/2018   Procedure: AMPUTATION RIGHT SECOND TOE;  Surgeon: Leane Loring E, MD;  Location: [facility] OR;  Service: Vascular;  Laterality: Right;  . AMPUTATION Right 03/18/2018   Procedure: AMPUTATION BELOW KNEE;  Surgeon: Sharena Dibenedetto E, MD;  Location: [facility] OR;  Service: Vascular;  Laterality: Right;  . BELOW KNEE LEG AMPUTATION Right 03/18/2018  . bilateral cataract surgery     . BIOPSY N/A 10/22/2017   Procedure: BIOPSY;  Surgeon: Jenkins, Mark, MD;  Location: [facility];  Service: Gastroenterology;  Laterality: N/A;  . CATARACT EXTRACTION Bilateral 09/20/2016   . COLONOSCOPY N/A 09/18/2016   Dr. Jenkins: normal  . ESOPHAGOGASTRODUODENOSCOPY N/A 10/22/2017   Dr. Jenkins: normal duodenum, gastric mucosal atrophy but negative H.pylori (Clotest negative), normal GE junction  . GIVENS CAPSULE STUDY N/A 01/27/2018   Procedure: GIVENS CAPSULE STUDY;  Surgeon: Rourk, Robert M, MD;  Location: [facility];  Service: Endoscopy;  Laterality: N/A;  7:00am  . HERNIA REPAIR     2010/laparoscopic/right  . IVC FILTER INSERTION     01/2013   . KNEE ARTHROSCOPY Right    2007  . MULTIPLE TOOTH EXTRACTIONS    . PERIPHERAL VASCULAR BALLOON ANGIOPLASTY  02/17/2018   Procedure: PERIPHERAL VASCULAR BALLOON ANGIOPLASTY;  Surgeon: Cain, Brandon Christopher, MD;  Location: [facility];  Service: Cardiovascular;;  . TOTAL HIP ARTHROPLASTY Right 05/01/2017   Procedure: RIGHT TOTAL HIP ARTHROPLASTY ANTERIOR APPROACH;  Surgeon: Aluisio, Frank, MD;  Location: [facility];  Service: Orthopedics;  Laterality: Right;  . TOTAL KNEE ARTHROPLASTY Left 12/13/2014   Procedure: LEFT TOTAL KNEE ARTHROPLASTY;  Surgeon: Frank Aluisio, MD;  Location: [facility];  Service: Orthopedics;  Laterality: Left;  . TOTAL KNEE ARTHROPLASTY Right 03/21/2015   Procedure: RIGHT TOTAL KNEE ARTHROPLASTY;  Surgeon: Frank Aluisio, MD;  Location: [facility];  Service: Orthopedics;  Laterality: Right;    Current Outpatient Medications on File Prior to Visit  Medication Sig Dispense Refill  . acetaminophen (TYLENOL) 500 MG tablet Take 500 mg   by mouth every 6 (six) hours as needed for moderate pain or headache.     Marland Kitchen Bioflavonoid Products (ESTER C PO) Take 500 mg by mouth daily.    Marland Kitchen docusate sodium (COLACE) 100 MG capsule Take 100 mg by mouth daily.    . finasteride (PROSCAR) 5 MG tablet     . hydroxychloroquine (PLAQUENIL) 200 MG tablet Take 200 mg by mouth 2 (two) times daily.    Marland Kitchen ketoconazole (NIZORAL) 2 % cream ketoconazole 2 % topical cream    . lisinopril-hydrochlorothiazide (PRINZIDE,ZESTORETIC) 10-12.5 MG  per tablet Take 1 tablet by mouth daily with breakfast.     . Menthol, Topical Analgesic, (ICY HOT EX) Apply 1 application topically 2 (two) times daily as needed (left knee.).    Marland Kitchen Misc Natural Products (URINOZINC PO) Take 1 capsule by mouth 2 (two) times daily.     . Multiple Vitamin (MULTIVITAMIN WITH MINERALS) TABS tablet Take 1 tablet by mouth daily. Centrum Silver    . OMEGA-3 KRILL OIL PO Take 1 capsule by mouth daily.    Marland Kitchen omeprazole (PRILOSEC) 20 MG capsule Take 20 mg by mouth daily.    . predniSONE (DELTASONE) 5 MG tablet     . tamsulosin (FLOMAX) 0.4 MG CAPS capsule Take 2 capsules (0.8 mg total) by mouth daily. 60 capsule 0  . traMADol (ULTRAM) 50 MG tablet tramadol 50 mg tablet  TAKE 1 TABLET BY MOUTH EVERY 12 HOURS AS NEEDED    . warfarin (COUMADIN) 5 MG tablet Take 1 tablet (5 mg total) by mouth every morning. Take Coumadin for three weeks for postoperative protocol and then the patient may resume their previous Coumadin home regimen.  The dose may need to be adjusted based upon the INR.  Please follow the INR and titrate Coumadin dose for a therapeutic range between 2.0 and 3.0 INR.  After completing the three weeks of Coumadin, the patient may resume their previous Coumadin home regimen. (Patient taking differently: Take 5 mg by mouth daily.) 35 tablet 0   No current facility-administered medications on file prior to visit.    Physical exam:  Vitals:   12/22/20 1520  BP: 124/75  Pulse: 71  Resp: 20  Temp: 98 F (36.7 C)  SpO2: 97%  Weight: 210 lb (95.3 kg)  Height: 5\' 8"  (1.727 m)    Extremities: No palpable pedal pulses left leg 4 mm ulceration dorsum left second toe  Data: Since previous ABIs and February of this year showed noncompressible vessels with a toe pressure of 32.  Assessment: Nonhealing wound left foot most likely secondary to chronic tibial artery occlusive disease.  Plan: Aortogram with lower extremity runoff possible intervention scheduled for  December 30, 2020.  We will stop his warfarin 3 days prior.  Risk benefits possible complications and procedure details were discussed the patient and his wife today.  He understands agrees to proceed.  January 01, 2021, MD [facility] Office: [phone]

## 2020-12-23 ENCOUNTER — Other Ambulatory Visit: Payer: Self-pay

## 2020-12-28 ENCOUNTER — Other Ambulatory Visit: Payer: Self-pay

## 2020-12-28 ENCOUNTER — Other Ambulatory Visit
Admission: RE | Admit: 2020-12-28 | Discharge: 2020-12-28 | Disposition: A | Discharge status: Home / Self Care | Payer: Medicare HMO | Source: Ambulatory Visit | Attending: Vascular Surgery | Admitting: Vascular Surgery

## 2020-12-28 DIAGNOSIS — I10 Essential (primary) hypertension: Secondary | ICD-10-CM | POA: Diagnosis not present

## 2020-12-28 DIAGNOSIS — Z01812 Encounter for preprocedural laboratory examination: Secondary | ICD-10-CM | POA: Diagnosis not present

## 2020-12-28 DIAGNOSIS — M328 Other forms of systemic lupus erythematosus: Secondary | ICD-10-CM | POA: Diagnosis not present

## 2020-12-28 DIAGNOSIS — I70229 Atherosclerosis of native arteries of extremities with rest pain, unspecified extremity: Secondary | ICD-10-CM | POA: Diagnosis not present

## 2020-12-28 DIAGNOSIS — Z20822 Contact with and (suspected) exposure to covid-19: Secondary | ICD-10-CM | POA: Diagnosis not present

## 2020-12-28 DIAGNOSIS — Z87891 Personal history of nicotine dependence: Secondary | ICD-10-CM | POA: Diagnosis not present

## 2020-12-28 LAB — SARS CORONAVIRUS 2 (TAT 6-24 HRS): SARS Coronavirus 2: NEGATIVE

## 2020-12-30 ENCOUNTER — Ambulatory Visit (HOSPITAL_COMMUNITY)
Admission: RE | Admit: 2020-12-30 | Discharge: 2020-12-30 | Disposition: A | Discharge status: Home / Self Care | Payer: Medicare HMO | Attending: Vascular Surgery | Admitting: Vascular Surgery

## 2020-12-30 ENCOUNTER — Encounter (HOSPITAL_COMMUNITY)
Admission: RE | Disposition: A | Discharge status: Home / Self Care | Payer: Self-pay | Source: Home / Self Care | Attending: Vascular Surgery

## 2020-12-30 DIAGNOSIS — Z79899 Other long term (current) drug therapy: Secondary | ICD-10-CM | POA: Diagnosis not present

## 2020-12-30 DIAGNOSIS — I70245 Atherosclerosis of native arteries of left leg with ulceration of other part of foot: Secondary | ICD-10-CM | POA: Insufficient documentation

## 2020-12-30 DIAGNOSIS — Z7901 Long term (current) use of anticoagulants: Secondary | ICD-10-CM | POA: Insufficient documentation

## 2020-12-30 HISTORY — PX: ABDOMINAL AORTOGRAM W/LOWER EXTREMITY: CATH118223

## 2020-12-30 HISTORY — PX: PERIPHERAL VASCULAR BALLOON ANGIOPLASTY: CATH118281

## 2020-12-30 LAB — POCT ACTIVATED CLOTTING TIME
Activated Clotting Time: 261 seconds
Activated Clotting Time: 231 seconds
Activated Clotting Time: 190 seconds
Activated Clotting Time: 178 seconds

## 2020-12-30 LAB — POCT I-STAT, CHEM 8
BUN: 14 mg/dL (ref 8–23)
Calcium, Ion: 1.22 mmol/L (ref 1.15–1.40)
Chloride: 100 mmol/L (ref 98–111)
Creatinine, Ser: 0.6 mg/dL — ABNORMAL LOW (ref 0.61–1.24)
Glucose, Bld: 85 mg/dL (ref 70–99)
HCT: 35 % — ABNORMAL LOW (ref 39.0–52.0)
Hemoglobin: 11.9 g/dL — ABNORMAL LOW (ref 13.0–17.0)
Potassium: 3.6 mmol/L (ref 3.5–5.1)
Sodium: 140 mmol/L (ref 135–145)
TCO2: 31 mmol/L (ref 22–32)

## 2020-12-30 LAB — PROTIME-INR
INR: 1.4 — ABNORMAL HIGH (ref 0.8–1.2)
Prothrombin Time: 16.5 seconds — ABNORMAL HIGH (ref 11.4–15.2)

## 2020-12-30 SURGERY — ABDOMINAL AORTOGRAM W/LOWER EXTREMITY
Anesthesia: LOCAL | Laterality: Left

## 2020-12-30 MED ORDER — SODIUM CHLORIDE 0.9% FLUSH: 3.0000 mL | Freq: Two times a day (BID) | INTRAVENOUS | Status: DC

## 2020-12-30 MED ORDER — LIDOCAINE HCL (PF) 1 % IJ SOLN
INTRAMUSCULAR | Status: DC | PRN
  Administered 2020-12-30: 15 mL

## 2020-12-30 MED ORDER — SODIUM CHLORIDE 0.9% FLUSH: 3.0000 mL | INTRAVENOUS | Status: DC | PRN

## 2020-12-30 MED ORDER — ONDANSETRON HCL 4 MG/2ML IJ SOLN: 4.0000 mg | Freq: Four times a day (QID) | INTRAMUSCULAR | Status: DC | PRN

## 2020-12-30 MED ORDER — CLOPIDOGREL BISULFATE 300 MG PO TABS
ORAL_TABLET | ORAL | Status: DC | PRN
  Administered 2020-12-30: 300 mg via ORAL

## 2020-12-30 MED ORDER — SODIUM CHLORIDE 0.9 % IV SOLN: 250.0000 mL | INTRAVENOUS | Status: DC | PRN

## 2020-12-30 MED ORDER — ACETAMINOPHEN 325 MG PO TABS: 650.0000 mg | ORAL_TABLET | ORAL | Status: DC | PRN

## 2020-12-30 MED ORDER — CLOPIDOGREL BISULFATE 75 MG PO TABS: 75.0000 mg | ORAL_TABLET | Freq: Every day | ORAL | 0 refills | Status: DC

## 2020-12-30 MED ORDER — LABETALOL HCL 5 MG/ML IV SOLN: 10.0000 mg | INTRAVENOUS | Status: DC | PRN

## 2020-12-30 MED ORDER — LIDOCAINE HCL (PF) 1 % IJ SOLN
INTRAMUSCULAR | Status: AC
  Filled 2020-12-30: qty 30

## 2020-12-30 MED ORDER — SODIUM CHLORIDE 0.9 % IV SOLN: INTRAVENOUS | Status: AC

## 2020-12-30 MED ORDER — HEPARIN (PORCINE) IN NACL 1000-0.9 UT/500ML-% IV SOLN
INTRAVENOUS | Status: DC | PRN
  Administered 2020-12-30 (×2): 500 mL

## 2020-12-30 MED ORDER — HEPARIN SODIUM (PORCINE) 1000 UNIT/ML IJ SOLN
INTRAMUSCULAR | Status: DC | PRN
  Administered 2020-12-30: 10000 [IU] via INTRAVENOUS

## 2020-12-30 MED ORDER — HYDRALAZINE HCL 20 MG/ML IJ SOLN
INTRAMUSCULAR | Status: AC
  Filled 2020-12-30: qty 1

## 2020-12-30 MED ORDER — CLOPIDOGREL BISULFATE 75 MG PO TABS: 75.0000 mg | ORAL_TABLET | Freq: Every day | ORAL | Status: DC

## 2020-12-30 MED ORDER — IODIXANOL 320 MG/ML IV SOLN
INTRAVENOUS | Status: DC | PRN
  Administered 2020-12-30: 120 mL

## 2020-12-30 MED ORDER — SODIUM CHLORIDE 0.9 % IV SOLN: INTRAVENOUS | Status: DC

## 2020-12-30 MED ORDER — HEPARIN SODIUM (PORCINE) 1000 UNIT/ML IJ SOLN
INTRAMUSCULAR | Status: AC
  Filled 2020-12-30: qty 1

## 2020-12-30 MED ORDER — HEPARIN (PORCINE) IN NACL 1000-0.9 UT/500ML-% IV SOLN
INTRAVENOUS | Status: AC
  Filled 2020-12-30: qty 1000

## 2020-12-30 MED ORDER — CLOPIDOGREL BISULFATE 75 MG PO TABS: 300.0000 mg | ORAL_TABLET | Freq: Once | ORAL | Status: DC

## 2020-12-30 MED ORDER — CLOPIDOGREL BISULFATE 300 MG PO TABS
ORAL_TABLET | ORAL | Status: AC
  Filled 2020-12-30: qty 1

## 2020-12-30 MED ORDER — FENTANYL CITRATE (PF) 100 MCG/2ML IJ SOLN
INTRAMUSCULAR | Status: DC | PRN
  Administered 2020-12-30 (×2): 25 ug via INTRAVENOUS

## 2020-12-30 MED ORDER — HYDRALAZINE HCL 20 MG/ML IJ SOLN
5.0000 mg | INTRAMUSCULAR | Status: DC | PRN
  Administered 2020-12-30: 5 mg via INTRAVENOUS

## 2020-12-30 MED ORDER — FENTANYL CITRATE (PF) 100 MCG/2ML IJ SOLN
INTRAMUSCULAR | Status: AC
  Filled 2020-12-30: qty 2

## 2020-12-30 SURGICAL SUPPLY — 22 items
BALL STERLING OTW 2.5X150X150 (BALLOONS) ×2
BALLN STERLING OTW 2.5X150X150 (BALLOONS) ×1
BALLN STERLING OTW 2X220X150 (BALLOONS) ×2
BALLOON STERLING OTW 2X220X150 (BALLOONS) IMPLANT
BALLOON STRLNG OTW 2.5X150X150 (BALLOONS) IMPLANT
CATH ANGIO 5F PIGTAIL 65CM (CATHETERS) ×1 IMPLANT
CATH CROSS OVER TEMPO 5F (CATHETERS) ×1 IMPLANT
CATH QUICKCROSS .035X135CM (MICROCATHETER) ×1 IMPLANT
CATH QUICKCROSS ANG SELECT (CATHETERS) ×1 IMPLANT
CATH STRAIGHT 5FR 65CM (CATHETERS) ×1 IMPLANT
GUIDEWIRE ANGLED .035X150CM (WIRE) ×1 IMPLANT
KIT PV (KITS) ×2 IMPLANT
SHEATH PINNACLE 5F 10CM (SHEATH) ×1 IMPLANT
SHEATH PINNACLE 6F 10CM (SHEATH) ×1 IMPLANT
SHEATH PINNACLE ST 6F 65CM (SHEATH) ×1 IMPLANT
SHEATH PROBE COVER 6X72 (BAG) ×1 IMPLANT
SYR MEDRAD MARK V 150ML (SYRINGE) ×1 IMPLANT
TRANSDUCER W/STOPCOCK (MISCELLANEOUS) ×2 IMPLANT
TRAY PV CATH (CUSTOM PROCEDURE TRAY) ×2 IMPLANT
WIRE G V18X300CM (WIRE) ×1 IMPLANT
WIRE HITORQ VERSACORE ST 145CM (WIRE) ×1 IMPLANT
WIRE ROSEN-J .035X260CM (WIRE) ×1 IMPLANT

## 2020-12-30 NOTE — Op Note (Signed)
Procedure: Abdominal aortogram with left lower extremity runoff, fourth order catheterization left posterior tibial artery, angioplasty left posterior tibial artery  Preoperative diagnosis: Nonhealing wound left foot  Postoperative diagnosis: Same  Anesthesia: Local with IV sedation  Operative findings: 1.  No significant aortoiliac popliteal or superficial femoral artery occlusive disease  2.  Severe tibial artery occlusive disease with chronically occluded anterior tibial artery, patent peroneal artery, diffuse subtotal occlusions posterior tibial artery (angioplastied to 0% residual stenosis)  3.  IVC filter noted  Operative details: After obtaining informed consent, the patient was taken to the [place] lab.  The patient was placed in supine position on the angio table.  Patient's right groin was prepped and draped in usual sterile fashion.  Local anesthesia was infiltrated over the right common femoral artery.  Ultrasound was used to identify the right common femoral artery and femoral bifurcation.  An introducer needle was used to cannulate the right common femoral artery and an 035 versa core wire threaded in the abdominal aorta under fluoroscopic guidance.  Next a 5 French sheath was placed over the guidewire in the right common femoral artery.  This was thoroughly flushed with heparinized saline.  5 French pigtail catheter was then advanced over the guidewire and abdominal aortogram obtained in AP projection.  There is an inferior vena cava filter present.  Left and right renal arteries are patent.  Infrarenal abdominal aorta is patent.  The left and right common external and internal iliac arteries are all patent.  At this point a 5 [place] crossover catheter was brought up in the operative field and advanced over the wire to selectively catheterize the left common iliac artery.  An 035 angled Glidewire was then advanced down into the left superficial femoral artery.  The patient had a fairly  steep aortic bifurcation.  Therefore I initially placed 0355 French straight catheter over the guidewire into the proximal left common iliac artery.  I was then able to direct the 035 angled Glidewire into the mid left superficial femoral artery.  The 5 French straight catheter was then advanced to the mid left common femoral artery.  Left lower extremity runoff views were then obtained.  In the left lower extremity, the left common femoral profundofemoral superficial femoral-popliteal arteries are all widely patent.  The anterior tibial artery occludes about 5 cm after its origin.  The peroneal artery is open all the way to the foot.  This does fill from collateral branches some of the dorsalis pedis artery.  The posterior tibial artery is patent but with multiple segments of subtotal occlusion extending over the entire course of the artery.  At this point the 035 angled Glidewire was advanced to the mid superficial femoral artery and a 5 French straight catheter swapped out for an 035 quick cross.  I then placed an 035 Rosen wire to the mid left superficial femoral artery to provide a stable platform for sheath exchange.  The 5 French sheath was then swapped out for a 6 French 65 cm sheath which was advanced to the midportion of the left superficial femoral artery.  The patient was then given 10,000 units of intravenous heparin.  ACT was found to be 265.  Using a 018 select catheter this was advanced over a V 18 wire and the posterior tibial artery selected and the guidewire advanced all the way in the posterior tibial artery to the level of the ankle.  This was confirmed with contrast angiogram.  I then proceeded to angioplasty the  entire posterior tibial artery from the ankle joint all the way up to its origin.  This was done with overlapping inflations first with a 2 mm 220 balloon.  There was still several areas of severe narrowing.  The 2 balloon was then swapped out for 3 mm 150 balloon.  This was  inflated with overlapping inflations to 8 atm and a completion angiogram then performed.  The posterior tibial artery was patent and the runoff was preserved as well as the peroneal artery which still was in continuity.  At this point the guidewire was removed.  The sheath was pulled back over the [facility] wire.  This was swapped out for a 6 [place] short sheath.  This was thoroughly flushed with heparinized saline.  The patient tired procedure well and there were no complications.  Patient was taken to the holding area in stable condition.  Operative management: The patient now has two-vessel runoff via the peroneal and posterior tibial arteries.  He has chronic occlusion of the anterior tibial artery which probably cannot be reclaimed.  Most likely this is as good as his perfusion will get.  We will see him back in a few weeks to see if his toe ulcer is healing.  He will be started on a 6-week course of Plavix today.  He is on warfarin.  I discussed with him the possibility of bleeding complications associated with this but with a long angioplasty of the posterior tibial artery I believe he needs this extra antiplatelet at least for 6 weeks.  He will resume his warfarin tomorrow.  He was given a loading dose of 300 mg of Plavix on the table.  [name], MD [facility] Office: [phone]

## 2020-12-30 NOTE — Discharge Instructions (Signed)
MAY RESUME WARFARIN AT USUAL DOSE TOMORROW 12/31/2020   Femoral Site Care  This sheet gives you information about how to care for yourself after your procedure. Your health care provider may also give you more specific instructions. If you have problems or questions, contact your health care provider. What can I expect after the procedure? After the procedure, it is common to have:  Bruising that usually fades within 1-2 weeks.  Tenderness at the site. Follow these instructions at home: Wound care  Follow instructions from your health care provider about how to take care of your insertion site. Make sure you: ? Wash your hands with soap and water before you change your bandage (dressing). If soap and water are not available, use hand sanitizer. ? Change your dressing as told by your health care provider. ? Leave stitches (sutures), skin glue, or adhesive strips in place. These skin closures may need to stay in place for 2 weeks or longer. If adhesive strip edges start to loosen and curl up, you may trim the loose edges. Do not remove adhesive strips completely unless your health care provider tells you to do that.  Do not take baths, swim, or use a hot tub until your health care provider approves.  You may shower 24-48 hours after the procedure or as told by your health care provider. ? Gently wash the site with plain soap and water. ? Pat the area dry with a clean towel. ? Do not rub the site. This may cause bleeding.  Do not apply powder or lotion to the site. Keep the site clean and dry.  Check your femoral site every day for signs of infection. Check for: ? Redness, swelling, or pain. ? Fluid or blood. ? Warmth. ? Pus or a bad smell. Activity  For the first 2-3 days after your procedure, or as long as directed: ? Avoid climbing stairs as much as possible. ? Do not squat.  Do not lift anything that is heavier than 10 lb (4.5 kg), or the limit that you are told, until your  health care provider says that it is safe.  Rest as directed. ? Avoid sitting for a long time without moving. Get up to take short walks every 1-2 hours.  Do not drive for 24 hours if you were given a medicine to help you relax (sedative). General instructions  Take over-the-counter and prescription medicines only as told by your health care provider.  Keep all follow-up visits as told by your health care provider. This is important. Contact a health care provider if you have:  A fever or chills.  You have redness, swelling, or pain around your insertion site. Get help right away if:  The catheter insertion area swells very fast.  You pass out.  You suddenly start to sweat or your skin gets clammy.  The catheter insertion area is bleeding, and the bleeding does not stop when you hold steady pressure on the area.  The area near or just beyond the catheter insertion site becomes pale, cool, tingly, or numb. These symptoms may represent a serious problem that is an emergency. Do not wait to see if the symptoms will go away. Get medical help right away. Call your local emergency services (911 in the [place] Do not drive yourself to the hospital. Summary  After the procedure, it is common to have bruising that usually fades within 1-2 weeks.  Check your femoral site every day for signs of infection.  Do not lift  anything that is heavier than 10 lb (4.5 kg), or the limit that you are told, until your health care provider says that it is safe. This information is not intended to replace advice given to you by your health care provider. Make sure you discuss any questions you have with your health care provider. Document Revised: 05/20/2020 Document Reviewed: 05/20/2020 Elsevier Patient Education  2021 [employer] Inc.  

## 2020-12-30 NOTE — Interval H&P Note (Signed)
History and Physical Interval Note:  12/30/2020 8:23 AM  Jose Bush  has presented today for surgery, with the diagnosis of left foot wound.  The various methods of treatment have been discussed with the patient and family. After consideration of risks, benefits and other options for treatment, the patient has consented to  Procedure(s): ABDOMINAL AORTOGRAM W/LOWER EXTREMITY (N/A) as a surgical intervention.  The patient's history has been reviewed, patient examined, no change in status, stable for surgery.  I have reviewed the patient's chart and labs.  Questions were answered to the patient's satisfaction.     Fabienne Bruns

## 2020-12-30 NOTE — Progress Notes (Signed)
Site area: rt groin fa sheath Site Prior to Removal:  Level 0 Pressure Applied For: 30 minutes Manual:   yes Patient Status During Pull:  stable Post Pull Site:  Level 0 Post Pull Instructions Given:  yes Post Pull Pulses Present: rt BKA Dressing Applied:  Gauze and tegaderm Bedrest begins @ 1350 Comments:

## 2020-12-30 NOTE — Interval H&P Note (Signed)
History and Physical Interval Note:  12/30/2020 8:54 AM  Jose Bush  has presented today for surgery, with the diagnosis of left foot wound.  The various methods of treatment have been discussed with the patient and family. After consideration of risks, benefits and other options for treatment, the patient has consented to  Procedure(s): ABDOMINAL AORTOGRAM W/LOWER EXTREMITY (N/A) as a surgical intervention.  The patient's history has been reviewed, patient examined, no change in status, stable for surgery.  I have reviewed the patient's chart and labs.  Questions were answered to the patient's satisfaction.     Fabienne Bruns

## 2021-01-02 ENCOUNTER — Encounter (HOSPITAL_COMMUNITY): Payer: Self-pay | Admitting: Vascular Surgery

## 2021-01-02 DIAGNOSIS — I4891 Unspecified atrial fibrillation: Secondary | ICD-10-CM | POA: Diagnosis not present

## 2021-01-04 DIAGNOSIS — M329 Systemic lupus erythematosus, unspecified: Secondary | ICD-10-CM | POA: Diagnosis not present

## 2021-01-04 DIAGNOSIS — M545 Low back pain, unspecified: Secondary | ICD-10-CM | POA: Diagnosis not present

## 2021-01-04 DIAGNOSIS — Z79899 Other long term (current) drug therapy: Secondary | ICD-10-CM | POA: Diagnosis not present

## 2021-01-04 DIAGNOSIS — M150 Primary generalized (osteo)arthritis: Secondary | ICD-10-CM | POA: Diagnosis not present

## 2021-01-04 DIAGNOSIS — M2550 Pain in unspecified joint: Secondary | ICD-10-CM | POA: Diagnosis not present

## 2021-01-04 DIAGNOSIS — E669 Obesity, unspecified: Secondary | ICD-10-CM | POA: Diagnosis not present

## 2021-01-04 DIAGNOSIS — R5382 Chronic fatigue, unspecified: Secondary | ICD-10-CM | POA: Diagnosis not present

## 2021-01-04 DIAGNOSIS — D6861 Antiphospholipid syndrome: Secondary | ICD-10-CM | POA: Diagnosis not present

## 2021-01-04 DIAGNOSIS — Z6833 Body mass index (BMI) 33.0-33.9, adult: Secondary | ICD-10-CM | POA: Diagnosis not present

## 2021-01-05 DIAGNOSIS — I4891 Unspecified atrial fibrillation: Secondary | ICD-10-CM | POA: Diagnosis not present

## 2021-01-11 ENCOUNTER — Other Ambulatory Visit: Payer: Self-pay

## 2021-01-11 ENCOUNTER — Ambulatory Visit: Payer: Medicare HMO | Admitting: Vascular Surgery

## 2021-01-11 ENCOUNTER — Ambulatory Visit (HOSPITAL_COMMUNITY)
Admission: RE | Admit: 2021-01-11 | Discharge: 2021-01-11 | Disposition: A | Discharge status: Home / Self Care | Payer: Medicare HMO | Source: Ambulatory Visit | Attending: Family Medicine | Admitting: Family Medicine

## 2021-01-11 ENCOUNTER — Encounter: Payer: Self-pay | Admitting: Vascular Surgery

## 2021-01-11 ENCOUNTER — Other Ambulatory Visit (HOSPITAL_COMMUNITY): Payer: Self-pay | Admitting: Vascular Surgery

## 2021-01-11 VITALS — BP 153/81 | HR 62 | Temp 98.2°F | Resp 18 | Ht 68.0 in | Wt 215.0 lb

## 2021-01-11 DIAGNOSIS — I739 Peripheral vascular disease, unspecified: Secondary | ICD-10-CM | POA: Diagnosis not present

## 2021-01-11 DIAGNOSIS — G8929 Other chronic pain: Secondary | ICD-10-CM | POA: Insufficient documentation

## 2021-01-11 DIAGNOSIS — R1031 Right lower quadrant pain: Secondary | ICD-10-CM | POA: Insufficient documentation

## 2021-01-11 MED ORDER — THROMBIN FOR PERCUTANEOUS TREATMENT OF PSEUDOANEURYSM (5000UNITS/10ML): Freq: Once | PERCUTANEOUS | Status: DC

## 2021-01-11 NOTE — Progress Notes (Signed)
Patient is an 80-year-old male who returns for follow-up today.  He underwent angioplasty of the left posterior tibial artery on December 30, 2020.  This was done for an ulcer on his left foot.  He thinks the ulcer is starting to heal.  He has a mass in his right groin which has been present since his arteriogram.  This has caused some pain.  He had a fair amount of ecchymosis but this is improved.  Physical exam:  Vitals:   01/11/21 1059  BP: (!) 153/81  Pulse: 62  Resp: 18  Temp: 98.2 F (36.8 C)  TempSrc: Temporal  SpO2: 100%  Weight: 215 lb (97.5 kg)  Height: 5' 8" (1.727 m)    Extremities: Right groin ill-defined mass firm nonpulsatile some ecchymosis in the right lateral thigh  2+ left posterior tibial pulse, 4 mm ulceration less than 1 mm depth dorsum of left third toe  Data: Patient had a duplex ultrasound today to rule out pseudoaneurysm in the right groin.  This shows a 2 x 2 centimeter right femoral pseudoaneurysm with a 1.5 cm neck  Assessment: 1.  Doing well status post angioplasty left posterior tibial artery with healing wound and palpable pulse of the foot  2.  Right femoral pseudoaneurysm.  We will stop the patient's warfarin April 15 and proceed with an attempt at right femoral artery pseudoaneurysm thrombin injection on Monday, April 18  As far as his left lower extremity is concerned we will see him back in 3 months time with repeat ABIs and a duplex of his left leg.  He is currently on Plavix and warfarin.  We will continue this at least to 3 months out.  Lera Gaines, MD [facility] [place] Office: [phone]  

## 2021-01-11 NOTE — H&P (View-Only) (Signed)
Patient is an 80 year old male who returns for follow-up today.  He underwent angioplasty of the left posterior tibial artery on December 30, 2020.  This was done for an ulcer on his left foot.  He thinks the ulcer is starting to heal.  He has a mass in his right groin which has been present since his arteriogram.  This has caused some pain.  He had a fair amount of ecchymosis but this is improved.  Physical exam:  Vitals:   01/11/21 1059  BP: (!) 153/81  Pulse: 62  Resp: 18  Temp: 98.2 F (36.8 C)  TempSrc: Temporal  SpO2: 100%  Weight: 215 lb (97.5 kg)  Height: 5\' 8"  (1.727 m)    Extremities: Right groin ill-defined mass firm nonpulsatile some ecchymosis in the right lateral thigh  2+ left posterior tibial pulse, 4 mm ulceration less than 1 mm depth dorsum of left third toe  Data: Patient had a duplex ultrasound today to rule out pseudoaneurysm in the right groin.  This shows a 2 x 2 centimeter right femoral pseudoaneurysm with a 1.5 cm neck  Assessment: 1.  Doing well status post angioplasty left posterior tibial artery with healing wound and palpable pulse of the foot  2.  Right femoral pseudoaneurysm.  We will stop the patient's warfarin April 15 and proceed with an attempt at right femoral artery pseudoaneurysm thrombin injection on Monday, April 18  As far as his left lower extremity is concerned we will see him back in 3 months time with repeat ABIs and a duplex of his left leg.  He is currently on Plavix and warfarin.  We will continue this at least to 3 months out.  April 20, MD [facility] [place] Office: [phone]

## 2021-01-11 NOTE — Addendum Note (Signed)
Addended by: Primitivo Gauze on: 01/11/2021 02:42 PM   Modules accepted: Orders

## 2021-01-12 ENCOUNTER — Ambulatory Visit: Payer: Medicare HMO | Admitting: Vascular Surgery

## 2021-01-13 ENCOUNTER — Other Ambulatory Visit: Payer: Self-pay

## 2021-01-13 ENCOUNTER — Other Ambulatory Visit
Admission: RE | Admit: 2021-01-13 | Discharge: 2021-01-13 | Disposition: A | Discharge status: Home / Self Care | Payer: Medicare HMO | Source: Ambulatory Visit | Attending: Vascular Surgery | Admitting: Vascular Surgery

## 2021-01-13 DIAGNOSIS — Z20822 Contact with and (suspected) exposure to covid-19: Secondary | ICD-10-CM | POA: Diagnosis not present

## 2021-01-13 DIAGNOSIS — Z01812 Encounter for preprocedural laboratory examination: Secondary | ICD-10-CM | POA: Diagnosis not present

## 2021-01-13 LAB — SARS CORONAVIRUS 2 (TAT 6-24 HRS): SARS Coronavirus 2: NEGATIVE

## 2021-01-16 ENCOUNTER — Encounter (HOSPITAL_COMMUNITY)
Admission: RE | Disposition: A | Discharge status: Home / Self Care | Payer: Self-pay | Source: Ambulatory Visit | Attending: Vascular Surgery

## 2021-01-16 ENCOUNTER — Other Ambulatory Visit: Payer: Self-pay

## 2021-01-16 ENCOUNTER — Ambulatory Visit (HOSPITAL_BASED_OUTPATIENT_CLINIC_OR_DEPARTMENT_OTHER)
Admission: RE | Admit: 2021-01-16 | Discharge: 2021-01-16 | Disposition: A | Discharge status: Home / Self Care | Payer: Medicare HMO | Source: Ambulatory Visit | Attending: Vascular Surgery | Admitting: Vascular Surgery

## 2021-01-16 ENCOUNTER — Ambulatory Visit (HOSPITAL_COMMUNITY)
Admission: RE | Admit: 2021-01-16 | Discharge: 2021-01-16 | Disposition: A | Discharge status: Home / Self Care | Payer: Medicare HMO | Source: Ambulatory Visit | Attending: Vascular Surgery | Admitting: Vascular Surgery

## 2021-01-16 DIAGNOSIS — Z7902 Long term (current) use of antithrombotics/antiplatelets: Secondary | ICD-10-CM | POA: Insufficient documentation

## 2021-01-16 DIAGNOSIS — I724 Aneurysm of artery of lower extremity: Secondary | ICD-10-CM | POA: Diagnosis not present

## 2021-01-16 DIAGNOSIS — T81718A Complication of other artery following a procedure, not elsewhere classified, initial encounter: Secondary | ICD-10-CM

## 2021-01-16 LAB — POCT I-STAT, CHEM 8
BUN: 9 mg/dL (ref 8–23)
Calcium, Ion: 1.22 mmol/L (ref 1.15–1.40)
Chloride: 101 mmol/L (ref 98–111)
Creatinine, Ser: 0.6 mg/dL — ABNORMAL LOW (ref 0.61–1.24)
Glucose, Bld: 79 mg/dL (ref 70–99)
HCT: 32 % — ABNORMAL LOW (ref 39.0–52.0)
Hemoglobin: 10.9 g/dL — ABNORMAL LOW (ref 13.0–17.0)
Potassium: 3.7 mmol/L (ref 3.5–5.1)
Sodium: 139 mmol/L (ref 135–145)
TCO2: 28 mmol/L (ref 22–32)

## 2021-01-16 SURGERY — PSEUDOANERYSM COMPRESSION

## 2021-01-16 MED ORDER — THROMBIN FOR PERCUTANEOUS TREATMENT OF PSEUDOANEURYSM (5000UNITS/10ML)
Freq: Once | PERCUTANEOUS | Status: DC
  Filled 2021-01-16: qty 1

## 2021-01-16 MED ORDER — CEFAZOLIN SODIUM-DEXTROSE 2-4 GM/100ML-% IV SOLN: 2.0000 g | INTRAVENOUS | Status: DC

## 2021-01-16 MED ORDER — SODIUM CHLORIDE 0.9 % IV SOLN: INTRAVENOUS | Status: DC

## 2021-01-16 MED ORDER — LIDOCAINE HCL (PF) 1 % IJ SOLN: 30.0000 mL | Freq: Once | INTRAMUSCULAR | Status: DC

## 2021-01-16 NOTE — Interval H&P Note (Signed)
History and Physical Interval Note:  01/16/2021 10:09 AM  Jose Bush  has presented today for surgery, with the diagnosis of pseudoanerysm.  The various methods of treatment have been discussed with the patient and family. After consideration of risks, benefits and other options for treatment, the patient has consented to  thrombin injection of femoral pseudoaneurysm as a surgical intervention.  The patient's history has been reviewed, patient examined, no change in status, stable for surgery.  I have reviewed the patient's chart and labs.  Questions were answered to the patient's satisfaction.     Fabienne Bruns

## 2021-01-16 NOTE — Progress Notes (Signed)
Pt right femoral pseudoaneurysm is thrombosed on [place] today.  Will continue to hold warfarin until Thursday 4/21 then resume normal dose.  Pt has follow up with me in July.  He will call sooner if problems with ulcer on left foot worsen or right groin mass worsens.  All of this was discussed with pt at bedside and his wife by phone.  Fabienne Bruns, MD [facility] [place] Office: [phone]

## 2021-01-16 NOTE — Progress Notes (Signed)
Right pseduo check has been completed.   Preliminary results in CV Proc.   Blanch Media 01/16/2021 10:41 AM

## 2021-01-17 ENCOUNTER — Other Ambulatory Visit: Payer: Self-pay

## 2021-01-17 DIAGNOSIS — I739 Peripheral vascular disease, unspecified: Secondary | ICD-10-CM

## 2021-01-23 DIAGNOSIS — I4891 Unspecified atrial fibrillation: Secondary | ICD-10-CM | POA: Diagnosis not present

## 2021-01-23 DIAGNOSIS — Z23 Encounter for immunization: Secondary | ICD-10-CM | POA: Diagnosis not present

## 2021-01-30 DIAGNOSIS — N400 Enlarged prostate without lower urinary tract symptoms: Secondary | ICD-10-CM | POA: Diagnosis not present

## 2021-01-30 DIAGNOSIS — I739 Peripheral vascular disease, unspecified: Secondary | ICD-10-CM | POA: Diagnosis not present

## 2021-01-30 DIAGNOSIS — Z86711 Personal history of pulmonary embolism: Secondary | ICD-10-CM | POA: Diagnosis not present

## 2021-01-30 DIAGNOSIS — Z6833 Body mass index (BMI) 33.0-33.9, adult: Secondary | ICD-10-CM | POA: Diagnosis not present

## 2021-01-30 DIAGNOSIS — I4891 Unspecified atrial fibrillation: Secondary | ICD-10-CM | POA: Diagnosis not present

## 2021-02-06 DIAGNOSIS — I4891 Unspecified atrial fibrillation: Secondary | ICD-10-CM | POA: Diagnosis not present

## 2021-02-07 ENCOUNTER — Other Ambulatory Visit: Payer: Self-pay

## 2021-02-07 ENCOUNTER — Ambulatory Visit: Payer: Medicare HMO | Admitting: Urology

## 2021-02-07 VITALS — BP 153/76 | HR 76 | Temp 98.5°F

## 2021-02-07 DIAGNOSIS — R3914 Feeling of incomplete bladder emptying: Secondary | ICD-10-CM | POA: Diagnosis not present

## 2021-02-07 DIAGNOSIS — N401 Enlarged prostate with lower urinary tract symptoms: Secondary | ICD-10-CM | POA: Diagnosis not present

## 2021-02-07 DIAGNOSIS — R339 Retention of urine, unspecified: Secondary | ICD-10-CM

## 2021-02-07 DIAGNOSIS — N390 Urinary tract infection, site not specified: Secondary | ICD-10-CM | POA: Diagnosis not present

## 2021-02-07 LAB — URINALYSIS, ROUTINE W REFLEX MICROSCOPIC
Bilirubin, UA: NEGATIVE
Glucose, UA: NEGATIVE
Ketones, UA: NEGATIVE
Leukocytes,UA: NEGATIVE
Nitrite, UA: NEGATIVE
Protein,UA: NEGATIVE
RBC, UA: NEGATIVE
Specific Gravity, UA: 1.015 (ref 1.005–1.030)
Urobilinogen, Ur: 0.2 mg/dL (ref 0.2–1.0)
pH, UA: 7 (ref 5.0–7.5)

## 2021-02-07 MED ORDER — FINASTERIDE 5 MG PO TABS: 5.0000 mg | ORAL_TABLET | Freq: Every day | ORAL | 11 refills | Status: DC

## 2021-02-07 NOTE — Progress Notes (Signed)
History of Present Illness: This gentleman comes that today for follow-up.  He has had a UroLift that has not improved his lower urinary tract symptoms significantly.  We had him scheduled for possible TURP but he had a vascular procedure performed by Dr. Darrick Penna.  He is on Coumadin.  He has had no recent dysuria or gross hematuria.  Past Medical History:  Diagnosis Date  . Aortic stenosis   . Arthritis   . BPH (benign prostatic hyperplasia)   . Essential hypertension   . GERD (gastroesophageal reflux disease)   . History of DVT (deep vein thrombosis)    Positive anticardiolipin  . History of hiatal hernia   . History of stroke 2010  . Lupus (HCC)   . PAD (peripheral artery disease) (HCC)    Right below-knee amputation in 2019  . Shingles   . Tinnitus   . Wears dentures   . Wears glasses     Past Surgical History:  Procedure Laterality Date  . ABDOMINAL AORTOGRAM W/LOWER EXTREMITY N/A 02/17/2018   Procedure: ABDOMINAL AORTOGRAM W/LOWER EXTREMITY;  Surgeon: Maeola Harman, MD;  Location: [facility] INVASIVE CV LAB;  Service: Cardiovascular;  Laterality: N/A;  . ABDOMINAL AORTOGRAM W/LOWER EXTREMITY Left 12/30/2020   Procedure: ABDOMINAL AORTOGRAM W/LOWER EXTREMITY;  Surgeon: Sherren Kerns, MD;  Location: [facility] INVASIVE CV LAB;  Service: Cardiovascular;  Laterality: Left;  . AMPUTATION Right 03/03/2018   Procedure: AMPUTATION RIGHT SECOND TOE;  Surgeon: Sherren Kerns, MD;  Location: [facility] OR;  Service: Vascular;  Laterality: Right;  . AMPUTATION Right 03/18/2018   Procedure: AMPUTATION BELOW KNEE;  Surgeon: Sherren Kerns, MD;  Location: [facility] OR;  Service: Vascular;  Laterality: Right;  . BELOW KNEE LEG AMPUTATION Right 03/18/2018  . bilateral cataract surgery     . BIOPSY N/A 10/22/2017   Procedure: BIOPSY;  Surgeon: Franky Macho, MD;  Location: [facility] SUITE;  Service: Gastroenterology;  Laterality: N/A;  . CATARACT EXTRACTION Bilateral 09/20/2016  . COLONOSCOPY N/A 09/18/2016    Dr. Lovell Sheehan: normal  . ESOPHAGOGASTRODUODENOSCOPY N/A 10/22/2017   Dr. Lovell Sheehan: normal duodenum, gastric mucosal atrophy but negative H.pylori (Clotest negative), normal GE junction  . GIVENS CAPSULE STUDY N/A 01/27/2018   Procedure: GIVENS CAPSULE STUDY;  Surgeon: Corbin Ade, MD;  Location: [facility] SUITE;  Service: Endoscopy;  Laterality: N/A;  7:00am  . HERNIA REPAIR     2010/laparoscopic/right  . IVC FILTER INSERTION     01/2013   . KNEE ARTHROSCOPY Right    2007  . MULTIPLE TOOTH EXTRACTIONS    . PERIPHERAL VASCULAR BALLOON ANGIOPLASTY  02/17/2018   Procedure: PERIPHERAL VASCULAR BALLOON ANGIOPLASTY;  Surgeon: Maeola Harman, MD;  Location: [facility] INVASIVE CV LAB;  Service: Cardiovascular;;  . PERIPHERAL VASCULAR BALLOON ANGIOPLASTY Left 12/30/2020   Procedure: PERIPHERAL VASCULAR BALLOON ANGIOPLASTY;  Surgeon: Sherren Kerns, MD;  Location: [facility] INVASIVE CV LAB;  Service: Cardiovascular;  Laterality: Left;  PT  . TOTAL HIP ARTHROPLASTY Right 05/01/2017   Procedure: RIGHT TOTAL HIP ARTHROPLASTY ANTERIOR APPROACH;  Surgeon: Ollen Gross, MD;  Location: [facility] ORS;  Service: Orthopedics;  Laterality: Right;  . TOTAL KNEE ARTHROPLASTY Left 12/13/2014   Procedure: LEFT TOTAL KNEE ARTHROPLASTY;  Surgeon: Ollen Gross, MD;  Location: [facility] ORS;  Service: Orthopedics;  Laterality: Left;  . TOTAL KNEE ARTHROPLASTY Right 03/21/2015   Procedure: RIGHT TOTAL KNEE ARTHROPLASTY;  Surgeon: Ollen Gross, MD;  Location: [facility] ORS;  Service: Orthopedics;  Laterality: Right;    Home Medications:  Allergies as  of 02/07/2021      Reactions   Sulfa Antibiotics    Weak and dizziness      Medication List       Accurate as of Feb 07, 2021  2:58 PM. If you have any questions, ask your nurse or doctor.        acetaminophen 650 MG CR tablet Commonly known as: TYLENOL Take 650 mg by mouth daily.   clopidogrel 75 MG tablet Commonly known as: Plavix Take 1 tablet (75 mg total) by mouth daily.    docusate sodium 100 MG capsule Commonly known as: COLACE Take 100 mg by mouth daily.   ESTER C PO Take 500 mg by mouth daily.   finasteride 5 MG tablet Commonly known as: PROSCAR Take 1 tablet (5 mg total) by mouth daily. Started by: Chelsea Aus, MD   hydroxychloroquine 200 MG tablet Commonly known as: PLAQUENIL Take 200 mg by mouth daily.   lisinopril-hydrochlorothiazide 10-12.5 MG tablet Commonly known as: ZESTORETIC Take 1 tablet by mouth daily with breakfast.   multivitamin with minerals Tabs tablet Take 1 tablet by mouth daily. Centrum Silver   OMEGA-3 KRILL OIL PO Take 350 mg by mouth daily.   omeprazole 20 MG capsule Commonly known as: PRILOSEC Take 20 mg by mouth daily.   predniSONE 5 MG tablet Commonly known as: DELTASONE Take 5 mg by mouth daily with breakfast.   SSD 1 % cream Generic drug: silver sulfADIAZINE Apply 1 application topically 2 (two) times daily.   tamsulosin 0.4 MG Caps capsule Commonly known as: FLOMAX Take 2 capsules (0.8 mg total) by mouth daily. What changed: how much to take   traMADol 50 MG tablet Commonly known as: ULTRAM Take 50 mg by mouth daily as needed for pain.   warfarin 5 MG tablet Commonly known as: COUMADIN Take 1 tablet (5 mg total) by mouth every morning. Take Coumadin for three weeks for postoperative protocol and then the patient may resume their previous Coumadin home regimen.  The dose may need to be adjusted based upon the INR.  Please follow the INR and titrate Coumadin dose for a therapeutic range between 2.0 and 3.0 INR.  After completing the three weeks of Coumadin, the patient may resume their previous Coumadin home regimen. What changed:   when to take this  additional instructions       Allergies:  Allergies  Allergen Reactions  . Sulfa Antibiotics     Weak and dizziness    Family History  Problem Relation Age of Onset  . Hypertension Other   . Stroke Mother   . Stroke Father   .  Heart failure Sister   . CAD Brother        CABG age 55  . Colon cancer Neg Hx     Social History:  reports that he quit smoking about 37 years ago. His smoking use included cigarettes. He has a 20.00 pack-year smoking history. He has never used smokeless tobacco. He reports that he does not drink alcohol and does not use drugs.  ROS: A complete review of systems was performed.  All systems are negative except for pertinent findings as noted.  Physical Exam:  Vital signs in last 24 hours: BP (!) 153/76   Pulse 76   Temp 98.5 F (36.9 C)  Constitutional:  Alert and oriented, No acute distress Cardiovascular: Regular rate  Respiratory: Normal respiratory effort Neurologic: Grossly intact, no focal deficits Psychiatric: Normal mood and affect  I have reviewed  prior pt notes  I have reviewed notes from referring/previous physicians  I have reviewed urinalysis results  I have independently reviewed prior imaging  I have reviewed prior PSA results    Impression/Assessment:  BPH with significant symptomatology, not improved with UroLift.  The patient and his wife desire further management, we have discussed TURP in the past  Plan:  I had a discussion with the patient and his wife regarding TURP, the procedure, expected risks, complications and expected outcomes.  They would like to proceed.  We will get this scheduled within the next 8 weeks.  I will get clearance for him to come off of his Coumadin for several days beforehand.

## 2021-02-07 NOTE — Progress Notes (Signed)
Urological Symptom Review  Patient is experiencing the following symptoms: Frequent urination Hard to postpone urination Get up at night to urinate Leakage of urine   Review of Systems  Gastrointestinal (upper)  : Negative for upper GI symptoms  Gastrointestinal (lower) : Negative for lower GI symptoms  Constitutional : Negative for symptoms  Skin: Negative for skin symptoms  Eyes: Negative for eye symptoms  Ear/Nose/Throat : Negative for Ear/Nose/Throat symptoms  Hematologic/Lymphatic: Negative for Hematologic/Lymphatic symptoms  Cardiovascular : Leg swelling  Respiratory : Negative for respiratory symptoms  Endocrine: Negative for endocrine symptoms  Musculoskeletal: Negative for musculoskeletal symptoms  Neurological: Negative for neurological symptoms  Psychologic: Negative for psychiatric symptoms

## 2021-02-10 ENCOUNTER — Other Ambulatory Visit: Payer: Self-pay | Admitting: Urology

## 2021-02-10 DIAGNOSIS — L989 Disorder of the skin and subcutaneous tissue, unspecified: Secondary | ICD-10-CM | POA: Diagnosis not present

## 2021-02-20 DIAGNOSIS — I4891 Unspecified atrial fibrillation: Secondary | ICD-10-CM | POA: Diagnosis not present

## 2021-02-21 DIAGNOSIS — Z89512 Acquired absence of left leg below knee: Secondary | ICD-10-CM | POA: Diagnosis not present

## 2021-02-21 DIAGNOSIS — L930 Discoid lupus erythematosus: Secondary | ICD-10-CM | POA: Diagnosis not present

## 2021-02-21 DIAGNOSIS — Z4781 Encounter for orthopedic aftercare following surgical amputation: Secondary | ICD-10-CM | POA: Diagnosis not present

## 2021-02-21 DIAGNOSIS — Z7901 Long term (current) use of anticoagulants: Secondary | ICD-10-CM | POA: Diagnosis not present

## 2021-02-21 DIAGNOSIS — Z7952 Long term (current) use of systemic steroids: Secondary | ICD-10-CM | POA: Diagnosis not present

## 2021-02-21 DIAGNOSIS — Z89611 Acquired absence of right leg above knee: Secondary | ICD-10-CM | POA: Diagnosis not present

## 2021-02-21 DIAGNOSIS — I119 Hypertensive heart disease without heart failure: Secondary | ICD-10-CM | POA: Diagnosis not present

## 2021-02-21 DIAGNOSIS — Z86718 Personal history of other venous thrombosis and embolism: Secondary | ICD-10-CM | POA: Diagnosis not present

## 2021-02-21 DIAGNOSIS — I739 Peripheral vascular disease, unspecified: Secondary | ICD-10-CM | POA: Diagnosis not present

## 2021-02-24 DIAGNOSIS — I4891 Unspecified atrial fibrillation: Secondary | ICD-10-CM | POA: Diagnosis not present

## 2021-02-27 DIAGNOSIS — Z89611 Acquired absence of right leg above knee: Secondary | ICD-10-CM | POA: Diagnosis not present

## 2021-02-27 DIAGNOSIS — Z86718 Personal history of other venous thrombosis and embolism: Secondary | ICD-10-CM | POA: Diagnosis not present

## 2021-02-27 DIAGNOSIS — L930 Discoid lupus erythematosus: Secondary | ICD-10-CM | POA: Diagnosis not present

## 2021-02-27 DIAGNOSIS — Z7901 Long term (current) use of anticoagulants: Secondary | ICD-10-CM | POA: Diagnosis not present

## 2021-02-27 DIAGNOSIS — I739 Peripheral vascular disease, unspecified: Secondary | ICD-10-CM | POA: Diagnosis not present

## 2021-02-27 DIAGNOSIS — Z89512 Acquired absence of left leg below knee: Secondary | ICD-10-CM | POA: Diagnosis not present

## 2021-02-27 DIAGNOSIS — I119 Hypertensive heart disease without heart failure: Secondary | ICD-10-CM | POA: Diagnosis not present

## 2021-02-27 DIAGNOSIS — Z7952 Long term (current) use of systemic steroids: Secondary | ICD-10-CM | POA: Diagnosis not present

## 2021-02-27 DIAGNOSIS — Z4781 Encounter for orthopedic aftercare following surgical amputation: Secondary | ICD-10-CM | POA: Diagnosis not present

## 2021-03-03 DIAGNOSIS — L930 Discoid lupus erythematosus: Secondary | ICD-10-CM | POA: Diagnosis not present

## 2021-03-03 DIAGNOSIS — Z86718 Personal history of other venous thrombosis and embolism: Secondary | ICD-10-CM | POA: Diagnosis not present

## 2021-03-03 DIAGNOSIS — I739 Peripheral vascular disease, unspecified: Secondary | ICD-10-CM | POA: Diagnosis not present

## 2021-03-03 DIAGNOSIS — I119 Hypertensive heart disease without heart failure: Secondary | ICD-10-CM | POA: Diagnosis not present

## 2021-03-03 DIAGNOSIS — Z7901 Long term (current) use of anticoagulants: Secondary | ICD-10-CM | POA: Diagnosis not present

## 2021-03-03 DIAGNOSIS — Z7952 Long term (current) use of systemic steroids: Secondary | ICD-10-CM | POA: Diagnosis not present

## 2021-03-03 DIAGNOSIS — Z89611 Acquired absence of right leg above knee: Secondary | ICD-10-CM | POA: Diagnosis not present

## 2021-03-03 DIAGNOSIS — Z4781 Encounter for orthopedic aftercare following surgical amputation: Secondary | ICD-10-CM | POA: Diagnosis not present

## 2021-03-03 DIAGNOSIS — Z89512 Acquired absence of left leg below knee: Secondary | ICD-10-CM | POA: Diagnosis not present

## 2021-03-07 DIAGNOSIS — I119 Hypertensive heart disease without heart failure: Secondary | ICD-10-CM | POA: Diagnosis not present

## 2021-03-07 DIAGNOSIS — Z7952 Long term (current) use of systemic steroids: Secondary | ICD-10-CM | POA: Diagnosis not present

## 2021-03-07 DIAGNOSIS — Z86718 Personal history of other venous thrombosis and embolism: Secondary | ICD-10-CM | POA: Diagnosis not present

## 2021-03-07 DIAGNOSIS — Z4781 Encounter for orthopedic aftercare following surgical amputation: Secondary | ICD-10-CM | POA: Diagnosis not present

## 2021-03-07 DIAGNOSIS — Z89512 Acquired absence of left leg below knee: Secondary | ICD-10-CM | POA: Diagnosis not present

## 2021-03-07 DIAGNOSIS — I739 Peripheral vascular disease, unspecified: Secondary | ICD-10-CM | POA: Diagnosis not present

## 2021-03-07 DIAGNOSIS — Z89611 Acquired absence of right leg above knee: Secondary | ICD-10-CM | POA: Diagnosis not present

## 2021-03-07 DIAGNOSIS — Z7901 Long term (current) use of anticoagulants: Secondary | ICD-10-CM | POA: Diagnosis not present

## 2021-03-07 DIAGNOSIS — L930 Discoid lupus erythematosus: Secondary | ICD-10-CM | POA: Diagnosis not present

## 2021-03-09 DIAGNOSIS — Z4781 Encounter for orthopedic aftercare following surgical amputation: Secondary | ICD-10-CM | POA: Diagnosis not present

## 2021-03-09 DIAGNOSIS — I739 Peripheral vascular disease, unspecified: Secondary | ICD-10-CM | POA: Diagnosis not present

## 2021-03-09 DIAGNOSIS — Z89611 Acquired absence of right leg above knee: Secondary | ICD-10-CM | POA: Diagnosis not present

## 2021-03-09 DIAGNOSIS — Z7952 Long term (current) use of systemic steroids: Secondary | ICD-10-CM | POA: Diagnosis not present

## 2021-03-09 DIAGNOSIS — Z86718 Personal history of other venous thrombosis and embolism: Secondary | ICD-10-CM | POA: Diagnosis not present

## 2021-03-09 DIAGNOSIS — L930 Discoid lupus erythematosus: Secondary | ICD-10-CM | POA: Diagnosis not present

## 2021-03-09 DIAGNOSIS — I119 Hypertensive heart disease without heart failure: Secondary | ICD-10-CM | POA: Diagnosis not present

## 2021-03-09 DIAGNOSIS — Z7901 Long term (current) use of anticoagulants: Secondary | ICD-10-CM | POA: Diagnosis not present

## 2021-03-09 DIAGNOSIS — Z89512 Acquired absence of left leg below knee: Secondary | ICD-10-CM | POA: Diagnosis not present

## 2021-03-10 DIAGNOSIS — I4891 Unspecified atrial fibrillation: Secondary | ICD-10-CM | POA: Diagnosis not present

## 2021-03-15 DIAGNOSIS — Z89611 Acquired absence of right leg above knee: Secondary | ICD-10-CM | POA: Diagnosis not present

## 2021-03-15 DIAGNOSIS — L930 Discoid lupus erythematosus: Secondary | ICD-10-CM | POA: Diagnosis not present

## 2021-03-15 DIAGNOSIS — Z7901 Long term (current) use of anticoagulants: Secondary | ICD-10-CM | POA: Diagnosis not present

## 2021-03-15 DIAGNOSIS — Z86718 Personal history of other venous thrombosis and embolism: Secondary | ICD-10-CM | POA: Diagnosis not present

## 2021-03-15 DIAGNOSIS — Z89512 Acquired absence of left leg below knee: Secondary | ICD-10-CM | POA: Diagnosis not present

## 2021-03-15 DIAGNOSIS — Z4781 Encounter for orthopedic aftercare following surgical amputation: Secondary | ICD-10-CM | POA: Diagnosis not present

## 2021-03-15 DIAGNOSIS — I119 Hypertensive heart disease without heart failure: Secondary | ICD-10-CM | POA: Diagnosis not present

## 2021-03-15 DIAGNOSIS — I739 Peripheral vascular disease, unspecified: Secondary | ICD-10-CM | POA: Diagnosis not present

## 2021-03-15 DIAGNOSIS — Z7952 Long term (current) use of systemic steroids: Secondary | ICD-10-CM | POA: Diagnosis not present

## 2021-03-22 DIAGNOSIS — Z89512 Acquired absence of left leg below knee: Secondary | ICD-10-CM | POA: Diagnosis not present

## 2021-03-22 DIAGNOSIS — L930 Discoid lupus erythematosus: Secondary | ICD-10-CM | POA: Diagnosis not present

## 2021-03-22 DIAGNOSIS — Z4781 Encounter for orthopedic aftercare following surgical amputation: Secondary | ICD-10-CM | POA: Diagnosis not present

## 2021-03-22 DIAGNOSIS — Z89611 Acquired absence of right leg above knee: Secondary | ICD-10-CM | POA: Diagnosis not present

## 2021-03-22 DIAGNOSIS — I119 Hypertensive heart disease without heart failure: Secondary | ICD-10-CM | POA: Diagnosis not present

## 2021-03-22 DIAGNOSIS — Z86718 Personal history of other venous thrombosis and embolism: Secondary | ICD-10-CM | POA: Diagnosis not present

## 2021-03-22 DIAGNOSIS — Z7952 Long term (current) use of systemic steroids: Secondary | ICD-10-CM | POA: Diagnosis not present

## 2021-03-22 DIAGNOSIS — I739 Peripheral vascular disease, unspecified: Secondary | ICD-10-CM | POA: Diagnosis not present

## 2021-03-22 DIAGNOSIS — Z7901 Long term (current) use of anticoagulants: Secondary | ICD-10-CM | POA: Diagnosis not present

## 2021-03-24 DIAGNOSIS — I4891 Unspecified atrial fibrillation: Secondary | ICD-10-CM | POA: Diagnosis not present

## 2021-03-30 DIAGNOSIS — I70229 Atherosclerosis of native arteries of extremities with rest pain, unspecified extremity: Secondary | ICD-10-CM | POA: Diagnosis not present

## 2021-03-30 DIAGNOSIS — I10 Essential (primary) hypertension: Secondary | ICD-10-CM | POA: Diagnosis not present

## 2021-03-30 DIAGNOSIS — M328 Other forms of systemic lupus erythematosus: Secondary | ICD-10-CM | POA: Diagnosis not present

## 2021-03-30 DIAGNOSIS — Z87891 Personal history of nicotine dependence: Secondary | ICD-10-CM | POA: Diagnosis not present

## 2021-04-04 ENCOUNTER — Telehealth: Payer: Self-pay

## 2021-04-04 NOTE — Telephone Encounter (Signed)
Patient called and made aware.

## 2021-04-06 NOTE — Progress Notes (Signed)
DUE TO COVID-19 ONLY ONE VISITOR IS ALLOWED TO COME WITH YOU AND STAY IN THE WAITING ROOM ONLY DURING PRE OP AND PROCEDURE DAY OF SURGERY. THE 1 VISITOR  MAY VISIT WITH YOU AFTER SURGERY IN YOUR PRIVATE ROOM DURING VISITING HOURS ONLY!  YOU NEED TO HAVE A COVID 19 TEST ON__7/18/2022 _____ @_______ , THIS TEST MUST BE DONE BEFORE SURGERY,  COVID TESTING SITE [address] [place] , IT IS ON THE RIGHT GOING OUT [address] APPROXIMATELY  2 MINUTES PAST [employer] ON THE RIGHT. ONCE YOUR COVID TEST IS COMPLETED,  PLEASE BEGIN THE QUARANTINE INSTRUCTIONS AS OUTLINED IN YOUR HANDOUT.                Jose Bush  04/06/2021   Your procedure is scheduled on:  04/20/2021   Report to [facility] Main  Entrance   Report to admitting at  0900 AM     Call this number if you have problems the morning of surgery [phone]    Remember: Do not eat food , candy gum or mints :After Midnight. You may have clear liquids from midnight until     CLEAR LIQUID DIET   Foods Allowed                                                                       Coffee and tea, regular and decaf                              Plain Jell-O any favor except red or purple                                            Fruit ices (not with fruit pulp)                                      Iced Popsicles                                     Carbonated beverages, regular and diet                                    Cranberry, grape and apple juices Sports drinks like Gatorade Lightly seasoned clear broth or consume(fat free) Sugar, honey syrup   _____________________________________________________________________    BRUSH YOUR TEETH MORNING OF SURGERY AND RINSE YOUR MOUTH OUT, NO CHEWING GUM CANDY OR MINTS.     Take these medicines the morning of surgery with A SIP OF WATER:  prilosec   DO NOT TAKE ANY DIABETIC MEDICATIONS DAY OF YOUR SURGERY                               You  may not have any metal on your body including  hair pins and              piercings  Do not wear jewelry, make-up, lotions, powders or perfumes, deodorant             Do not wear nail polish on your fingernails.  Do not shave  48 hours prior to surgery.              Men may shave face and neck.   Do not bring valuables to the hospital. [place].  Contacts, dentures or bridgework may not be worn into surgery.  Leave suitcase in the car. After surgery it may be brought to your room.     Patients discharged the day of surgery will not be allowed to drive home. IF YOU ARE HAVING SURGERY AND GOING HOME THE SAME DAY, YOU MUST HAVE AN ADULT TO DRIVE YOU HOME AND BE WITH YOU FOR 24 HOURS. YOU MAY GO HOME BY TAXI OR UBER OR ORTHERWISE, BUT AN ADULT MUST ACCOMPANY YOU HOME AND STAY WITH YOU FOR 24 HOURS.  Name and phone number of your driver:  Special Instructions: N/A              Please read over the following fact sheets you were given: _____________________________________________________________________  [facility] - Preparing for Surgery Before surgery, you can play an important role.  Because skin is not sterile, your skin needs to be as free of germs as possible.  You can reduce the number of germs on your skin by washing with CHG (chlorahexidine gluconate) soap before surgery.  CHG is an antiseptic cleaner which kills germs and bonds with the skin to continue killing germs even after washing. Please DO NOT use if you have an allergy to CHG or antibacterial soaps.  If your skin becomes reddened/irritated stop using the CHG and inform your nurse when you arrive at Short Stay. Do not shave (including legs and underarms) for at least 48 hours prior to the first CHG shower.  You may shave your face/neck. Please follow these instructions carefully:  1.  Shower with CHG Soap the night before surgery and the  morning of Surgery.  2.  If you choose to  wash your hair, wash your hair first as usual with your  normal  shampoo.  3.  After you shampoo, rinse your hair and body thoroughly to remove the  shampoo.                           4.  Use CHG as you would any other liquid soap.  You can apply chg directly  to the skin and wash                       Gently with a scrungie or clean washcloth.  5.  Apply the CHG Soap to your body ONLY FROM THE NECK DOWN.   Do not use on face/ open                           Wound or open sores. Avoid contact with eyes, ears mouth and genitals (private parts).                       Wash face,  Genitals (private parts) with  your normal soap.             6.  Wash thoroughly, paying special attention to the area where your surgery  will be performed.  7.  Thoroughly rinse your body with warm water from the neck down.  8.  DO NOT shower/wash with your normal soap after using and rinsing off  the CHG Soap.                9.  Pat yourself dry with a clean towel.            10.  Wear clean pajamas.            11.  Place clean sheets on your bed the night of your first shower and do not  sleep with pets. Day of Surgery : Do not apply any lotions/deodorants the morning of surgery.  Please wear clean clothes to the hospital/surgery center.  FAILURE TO FOLLOW THESE INSTRUCTIONS MAY RESULT IN THE CANCELLATION OF YOUR SURGERY PATIENT SIGNATURE_________________________________  NURSE SIGNATURE__________________________________  ________________________________________________________________________

## 2021-04-07 DIAGNOSIS — I4891 Unspecified atrial fibrillation: Secondary | ICD-10-CM | POA: Diagnosis not present

## 2021-04-12 ENCOUNTER — Encounter (HOSPITAL_COMMUNITY): Payer: Self-pay

## 2021-04-12 ENCOUNTER — Other Ambulatory Visit: Payer: Self-pay | Admitting: Vascular Surgery

## 2021-04-12 ENCOUNTER — Ambulatory Visit (INDEPENDENT_AMBULATORY_CARE_PROVIDER_SITE_OTHER)
Admission: RE | Admit: 2021-04-12 | Discharge: 2021-04-12 | Disposition: A | Discharge status: Home / Self Care | Payer: Medicare HMO | Source: Ambulatory Visit | Attending: Vascular Surgery | Admitting: Vascular Surgery

## 2021-04-12 ENCOUNTER — Ambulatory Visit (HOSPITAL_COMMUNITY)
Admission: RE | Admit: 2021-04-12 | Discharge: 2021-04-12 | Disposition: A | Discharge status: Home / Self Care | Payer: Medicare HMO | Source: Ambulatory Visit | Attending: Vascular Surgery | Admitting: Vascular Surgery

## 2021-04-12 ENCOUNTER — Other Ambulatory Visit: Payer: Self-pay

## 2021-04-12 ENCOUNTER — Ambulatory Visit: Payer: Medicare HMO | Admitting: Physician Assistant

## 2021-04-12 ENCOUNTER — Encounter (HOSPITAL_COMMUNITY)
Admission: RE | Admit: 2021-04-12 | Discharge: 2021-04-12 | Disposition: A | Discharge status: Home / Self Care | Payer: Medicare HMO | Source: Ambulatory Visit | Attending: Urology | Admitting: Urology

## 2021-04-12 VITALS — BP 143/70 | HR 69 | Temp 98.2°F | Resp 20 | Ht 68.0 in | Wt 217.5 lb

## 2021-04-12 DIAGNOSIS — I10 Essential (primary) hypertension: Secondary | ICD-10-CM | POA: Insufficient documentation

## 2021-04-12 DIAGNOSIS — I739 Peripheral vascular disease, unspecified: Secondary | ICD-10-CM

## 2021-04-12 DIAGNOSIS — Z01818 Encounter for other preprocedural examination: Secondary | ICD-10-CM | POA: Diagnosis not present

## 2021-04-12 HISTORY — DX: Unspecified atrial fibrillation: I48.91

## 2021-04-12 HISTORY — DX: Other pulmonary embolism without acute cor pulmonale: I26.99

## 2021-04-12 LAB — CBC
HCT: 38.7 % — ABNORMAL LOW (ref 39.0–52.0)
Hemoglobin: 11.8 g/dL — ABNORMAL LOW (ref 13.0–17.0)
MCH: 26.2 pg (ref 26.0–34.0)
MCHC: 30.5 g/dL (ref 30.0–36.0)
MCV: 85.8 fL (ref 80.0–100.0)
Platelets: 200 10*3/uL (ref 150–400)
RBC: 4.51 MIL/uL (ref 4.22–5.81)
RDW: 15.6 % — ABNORMAL HIGH (ref 11.5–15.5)
WBC: 8 10*3/uL (ref 4.0–10.5)
nRBC: 0 % (ref 0.0–0.2)

## 2021-04-12 LAB — BASIC METABOLIC PANEL
Anion gap: 6 (ref 5–15)
BUN: 9 mg/dL (ref 8–23)
CO2: 29 mmol/L (ref 22–32)
Calcium: 9.8 mg/dL (ref 8.9–10.3)
Chloride: 104 mmol/L (ref 98–111)
Creatinine, Ser: 0.6 mg/dL — ABNORMAL LOW (ref 0.61–1.24)
GFR, Estimated: >60 mL/min (ref >60)
Glucose, Bld: 100 mg/dL — ABNORMAL HIGH (ref 70–99)
Potassium: 4.5 mmol/L (ref 3.5–5.1)
Sodium: 139 mmol/L (ref 135–145)

## 2021-04-12 NOTE — Progress Notes (Addendum)
Anesthesia Review:  PCP:  Lianne Moris, PA  in [place] Have requested LOV note and clearance.  Received and placed on chart on 04/12/21- LOV - 01/31/21 on chart in note states needs clearance from cardiololgy  Cardiologist : DR Simona Huh  Cardiac clearance- 11/01/20- in telephone encounter  Vascular- DR Fabienne Bruns  LOV 01/11/21  Admitted on 01/16/2021 with pseudoaneurysm  To have follow up with Dr Darrick Penna with [facility] on 04/12/2021.   01/04/21- LOV with Zenovia Jordan- Pupus  Chest x-ray : EKG : 05/24/20  Echo :05/31/20  Stress test: Cardiac Cath :  Activity level:  Sleep Study/ CPAP : Fasting Blood Sugar :      / Checks Blood Sugar -- times a day:   Blood Thinner/ Instructions /Last Dose: ASA / Instructions/ Last Dose :   PT on Coumadin- to stop 5 days prior per iwfe. PT/INR ordered for am of surgery.  Hx of stroke- left side  Right BKA  Coumadin handled by PCP  COVID TEST - being done at [facility] per wife's request. For 04/17/21 at [facility].  At 1000am  LVMM for Yates Decamp at [facility] and have requested clearances for pt.

## 2021-04-12 NOTE — Progress Notes (Signed)
Office Note     CC:  follow up Requesting Provider:  Lianne Morisarroll, Erin, PA-C  HPI: Clearnce [name] is a 80 y.o. ([birth year]) male who presents for 3 month follow up of peripheral artery disease. He was last seen in April by Dr. Darrick PennaFields. Earlier this year he underwent angioplasty of his left posterior tibial artery on 12/30/20 for a left 3rd toe ulceration. At this visit he was having some improved healing of the ulceration. He was however noted to have a right femoral pseudoaneurysm. A thrombin injection was schedule for this but on presentation duplex this was found to be thrombosed so the procedure was cancelled.   Today he is here for follow up with non invasive studies. He reports 3rd toe wound is healed. He did scrape his 2nd toe but it is healing. He otherwise is not having any new wounds. No rest pain or claudication. He says he has been having some left hip pain on standing and ambulation. Usually stays in left hip without radiating but sometimes is associated with some numbness in left foot if he continues to walk far distance. He does ambulate with right BKA prosthesis and does not describe any claudication like symptoms. He has history of Right BKA in June of 2019. Having some pressure in it with prosthesis on. He remains on Plavix and Coumadin although he is holding it for upcoming procedure  He is scheduled to have TURP on 7/21. Says over past month he has been feeling very weak and fatigued. He is hopeful after procedure that he will feel better.   Also complaining of shaking and jerking muscles that occur when he goes to sleep. Says improved with taking spoons full of mustard. Says he was prescribed flexeril but that just made him feel sick and didn't help with the jerking  The pt is not on a statin for cholesterol management.  The pt is not on a daily aspirin.   Other AC:  Coumadin, Plavix The pt is on ACE/ HCTZ for hypertension.   The pt is not diabetic.   Tobacco hx:  Former, quit  1985  Past Medical History:  Diagnosis Date   Aortic stenosis    Arthritis    BPH (benign prostatic hyperplasia)    Essential hypertension    GERD (gastroesophageal reflux disease)    Heart murmur    History of DVT (deep vein thrombosis)    Positive anticardiolipin   History of hiatal hernia    History of stroke 2010   Lupus (HCC)    PAD (peripheral artery disease) (HCC)    Right below-knee amputation in 2019   Shingles    Stroke (HCC)    Tinnitus    Wears dentures    Wears glasses     Past Surgical History:  Procedure Laterality Date   ABDOMINAL AORTOGRAM W/LOWER EXTREMITY N/A 02/17/2018   Procedure: ABDOMINAL AORTOGRAM W/LOWER EXTREMITY;  Surgeon: Maeola Harmanain, Brandon Christopher, MD;  [facility];  Service: Cardiovascular;  Laterality: N/A;   ABDOMINAL AORTOGRAM W/LOWER EXTREMITY Left 12/30/2020   Procedure: ABDOMINAL AORTOGRAM W/LOWER EXTREMITY;  Surgeon: Sherren KernsFields, Charles E, MD;  Location: [facility];  Service: Cardiovascular;  Laterality: Left;   AMPUTATION Right 03/03/2018   Procedure: AMPUTATION RIGHT SECOND TOE;  Surgeon: Sherren KernsFields, Charles E, MD;  [facility];  Service: Vascular;  Laterality: Right;   AMPUTATION Right 03/18/2018   Procedure: AMPUTATION BELOW KNEE;  Surgeon: Sherren KernsFields, Charles E, MD;  [facility];  Service: Vascular;  Laterality: Right;   BELOW KNEE LEG AMPUTATION Right 03/18/2018   bilateral cataract surgery      BIOPSY N/A 10/22/2017   Procedure: BIOPSY;  Surgeon: Franky Macho, MD;  [facility];  Service: Gastroenterology;  Laterality: N/A;   CATARACT EXTRACTION Bilateral 09/20/2016   COLONOSCOPY N/A 09/18/2016   Dr. Lovell Sheehan: normal   ESOPHAGOGASTRODUODENOSCOPY N/A 10/22/2017   Dr. Lovell Sheehan: normal duodenum, gastric mucosal atrophy but negative H.pylori (Clotest negative), normal GE junction   GIVENS CAPSULE STUDY N/A 01/27/2018   Procedure: GIVENS CAPSULE STUDY;  Surgeon: Corbin Ade, MD;  Location: [facility];   Service: Endoscopy;  Laterality: N/A;  7:00am   HERNIA REPAIR     2010/laparoscopic/right   IVC FILTER INSERTION     01/2013    KNEE ARTHROSCOPY Right    2007   MULTIPLE TOOTH EXTRACTIONS     PERIPHERAL VASCULAR BALLOON ANGIOPLASTY  02/17/2018   Procedure: PERIPHERAL VASCULAR BALLOON ANGIOPLASTY;  Surgeon: Maeola Harman, MD;  Location: [facility] INVASIVE CV LAB;  Service: Cardiovascular;;   PERIPHERAL VASCULAR BALLOON ANGIOPLASTY Left 12/30/2020   Procedure: PERIPHERAL VASCULAR BALLOON ANGIOPLASTY;  Surgeon: Sherren Kerns, MD;  [facility]: [facility];  Service: Cardiovascular;  Laterality: Left;  PT   TOTAL HIP ARTHROPLASTY Right 05/01/2017   Procedure: RIGHT TOTAL HIP ARTHROPLASTY ANTERIOR APPROACH;  Surgeon: Ollen Gross, MD;  Location: [facility];  Service: Orthopedics;  Laterality: Right;   TOTAL KNEE ARTHROPLASTY Left 12/13/2014   Procedure: LEFT TOTAL KNEE ARTHROPLASTY;  Surgeon: Ollen Gross, MD;  Location: [facility];  Service: Orthopedics;  Laterality: Left;   TOTAL KNEE ARTHROPLASTY Right 03/21/2015   Procedure: RIGHT TOTAL KNEE ARTHROPLASTY;  Surgeon: Ollen Gross, MD;  Location: [facility];  Service: Orthopedics;  Laterality: Right;    Social History   Socioeconomic History   Marital status: Married    Spouse name: Not on file   Number of children: Not on file   Years of education: Not on file   Highest education level: Not on file  Occupational History   Not on file  Tobacco Use   Smoking status: Former    Packs/day: 0.50    Years: 40.00    Pack years: 20.00    Types: Cigarettes    Quit date: 10/02/1983    Years since quitting: 37.5   Smokeless tobacco: Never  Vaping Use   Vaping Use: Never used  Substance and Sexual Activity   Alcohol use: No   Drug use: No   Sexual activity: Not on file  Other Topics Concern   Not on file  Social History Narrative   Not on file   Social Determinants of Health   Financial Resource Strain: Not on file  Food Insecurity:  Not on file  Transportation Needs: Not on file  Physical Activity: Not on file  Stress: Not on file  Social Connections: Not on file  Intimate Partner Violence: Not on file    Family History  Problem Relation Age of Onset   Hypertension Other    Stroke Mother    Stroke Father    Heart failure Sister    CAD Brother        CABG age 63   Colon cancer Neg Hx     Current Outpatient Medications  Medication Sig Dispense Refill   acetaminophen (TYLENOL) 650 MG CR tablet Take 650 mg by mouth at bedtime.     Bioflavonoid Products (ESTER C PO) Take 500 mg by mouth daily.  clopidogrel (PLAVIX) 75 MG tablet Take 1 tablet (75 mg total) by mouth daily. 42 tablet 0   docusate sodium (COLACE) 100 MG capsule Take 100 mg by mouth daily.     finasteride (PROSCAR) 5 MG tablet Take 1 tablet (5 mg total) by mouth daily. (Patient taking differently: Take 5 mg by mouth daily. Restarted on 04/04/21 takes in the am) 90 tablet 11   hydroxychloroquine (PLAQUENIL) 200 MG tablet Take 200 mg by mouth daily.     lisinopril-hydrochlorothiazide (PRINZIDE,ZESTORETIC) 10-12.5 MG per tablet Take 1 tablet by mouth daily with breakfast.      Multiple Vitamin (MULTIVITAMIN WITH MINERALS) TABS tablet Take 1 tablet by mouth daily. Centrum Silver     Omega-3 350 MG CPDR Take 350 mg by mouth daily.     omeprazole (PRILOSEC) 20 MG capsule Take 20 mg by mouth daily.     predniSONE (DELTASONE) 5 MG tablet Take 5 mg by mouth daily with breakfast.     tamsulosin (FLOMAX) 0.4 MG CAPS capsule Take 2 capsules (0.8 mg total) by mouth daily. 60 capsule 0   traMADol (ULTRAM) 50 MG tablet Take 50 mg by mouth daily as needed for pain.     warfarin (COUMADIN) 5 MG tablet Take 1 tablet (5 mg total) by mouth every morning. Take Coumadin for three weeks for postoperative protocol and then the patient may resume their previous Coumadin home regimen.  The dose may need to be adjusted based upon the INR.  Please follow the INR and titrate  Coumadin dose for a therapeutic range between 2.0 and 3.0 INR.  After completing the three weeks of Coumadin, the patient may resume their previous Coumadin home regimen. (Patient taking differently: Take 2.5-5 mg by mouth See admin instructions. Take 5 mg on Fri, Sat, Mon, and Wed. Take 2.5 mg on Sun, Tues, and Thurs) 35 tablet 0   No current facility-administered medications for this visit.    Allergies  Allergen Reactions   Sulfa Antibiotics     Weak and dizziness     REVIEW OF SYSTEMS:   [X]  denotes positive finding, [ ]  denotes negative finding Cardiac  Comments:  Chest pain or chest pressure:    Shortness of breath upon exertion:    Short of breath when lying flat:    Irregular heart rhythm:        Vascular    Pain in calf, thigh, or hip brought on by ambulation:    Pain in feet at night that wakes you up from your sleep:     Blood clot in your veins:    Leg swelling:  X       Pulmonary    Oxygen at home:    Productive cough:     Wheezing:         Neurologic    Sudden weakness in arms or legs:     Sudden numbness in arms or legs:     Sudden onset of difficulty speaking or slurred speech:    Temporary loss of vision in one eye:     Problems with dizziness:         Gastrointestinal    Blood in stool:     Vomited blood:         Genitourinary    Burning when urinating:     Blood in urine:        Psychiatric    Major depression:         Hematologic    Bleeding problems:  Problems with blood clotting too easily:        Skin    Rashes or ulcers:        Constitutional    Fever or chills:      PHYSICAL EXAMINATION:  Vitals:   04/12/21 1346  BP: (!) 143/70  Pulse: 69  Resp: 20  Temp: 98.2 F (36.8 C)  TempSrc: Temporal  SpO2: 97%  Weight: 217 lb 8 oz (98.7 kg)  Height: 5\' 8"  (1.727 m)    General:  WDWN in NAD; vital signs documented above Gait: Not observed HENT: WNL, normocephalic Pulmonary: normal non-labored breathing , without  wheezing Cardiac: regular HR, without  Murmurs without carotid bruit Abdomen: soft, NT, no masses Vascular Exam/Pulses:  Right Left  Radial 2+ (normal) 2+ (normal)  Femoral 2+ (normal) 2+ (normal)  Popliteal BKA Not palpable  DP BKA Not palpable  PT BKA 1+ (weak)   Extremities: without ischemic changes, without Gangrene , without cellulitis; without open wounds; small scab on dorsum of 2nd toe. Chronic venous changes to LLE Musculoskeletal: no muscle wasting or atrophy  Neurologic: A&O X 3;  No focal weakness or paresthesias are detected Psychiatric:  The pt has Normal affect.   Non-Invasive Vascular Imaging:  04/12/21 ABI Findings:   +---------+------------------+-----+----------+-------+  Left     Lt Pressure (mmHg)IndexWaveform  Comment  +---------+------------------+-----+----------+-------+  Brachial 161                                       +---------+------------------+-----+----------+-------+  ATA      255               1.58 triphasic          +---------+------------------+-----+----------+-------+  PTA      255               1.58 monophasic         +---------+------------------+-----+----------+-------+  Great Toe0                 0.00 Absent             +---------+------------------+-----+----------+-------+   +-------+-----------+-----------+------------+------------+  ABI/TBIToday's ABIToday's TBIPrevious ABIPrevious TBI  +-------+-----------+-----------+------------+------------+  Left   [place]         0          [place]          0.21          +-------+-----------+-----------+------------+------------+   LLE arterial duplex shows patency throughout with biphasic/ triphasic flow to level of PT. Absent peroneal.   ASSESSMENT/PLAN:: 80 y.o. male here for follow up for Peripheral artery disease. He was last seen in April by Dr. May. Earlier this year he underwent angioplasty of his left posterior tibial artery on 12/30/20 for a left  3rd toe ulceration. He presently is not having any rest pain or claudication. His left 3rd toe ulcer has healed. He has small abrasion on 2nd toe but this is healing well.  - Duplex shows patency of LLE arteries throughout. However, does appear to have lost his Peroneal artery. He has known AT occlusion or previous Angiogram. PT has been dominant outflow, which appears to be open today.  - ABI remains non compressible. TBI is now absent on the left.  - he remains on Plavix and Coumadin ( although being held for TURP next week) - discussed importance of daily foot checks and keeping left foot protected -  elevate LLE as needed - advised them to follow up with Prosthetics regarding RLE prosthesis fit - I will have him follow up in 3 months with LLE arterial duplex and ABI   Graceann Congress, PA-C [facility] [phone]  On call MD:   Lenell Antu

## 2021-04-13 ENCOUNTER — Other Ambulatory Visit: Payer: Self-pay

## 2021-04-13 DIAGNOSIS — I739 Peripheral vascular disease, unspecified: Secondary | ICD-10-CM

## 2021-04-17 ENCOUNTER — Other Ambulatory Visit (HOSPITAL_COMMUNITY)
Admission: RE | Admit: 2021-04-17 | Discharge: 2021-04-17 | Disposition: A | Discharge status: Home / Self Care | Payer: Medicare HMO | Source: Ambulatory Visit | Attending: Urology | Admitting: Urology

## 2021-04-17 ENCOUNTER — Other Ambulatory Visit: Payer: Self-pay

## 2021-04-17 DIAGNOSIS — Z20822 Contact with and (suspected) exposure to covid-19: Secondary | ICD-10-CM | POA: Insufficient documentation

## 2021-04-17 DIAGNOSIS — Z01812 Encounter for preprocedural laboratory examination: Secondary | ICD-10-CM | POA: Diagnosis not present

## 2021-04-17 LAB — SARS CORONAVIRUS 2 (TAT 6-24 HRS): SARS Coronavirus 2: NEGATIVE

## 2021-04-18 NOTE — Progress Notes (Signed)
Anesthesia Chart Review   Case: [id number] Date/Time: 04/20/21 1045   Procedure: TRANSURETHRAL RESECTION OF THE PROSTATE (TURP) - 90 MINS   Anesthesia type: General   Pre-op diagnosis: BENIGN PROSTATIC HYPERPLASIA   [facility]   Surgeons: Marcine Matar, MD       DISCUSSION:80 y.o. former smoker with h/o GERD, Lupus, HTN, stroke, a-fib, PVD, PE, BPH scheduled for above procedure 04/20/21 with Dr. Marcine Matar.   Per cardiology note 11/01/2020, "Chart reviewed and patient contacted by phone today as part of pre-operative protocol coverage. Given past medical history and time since last visit, based on ACC/AHA guidelines, Jose Bush would be at acceptable risk for the planned procedure without further cardiovascular testing.   His Coumadin is handled by his primary care provider and the patient will contact him regarding Lovenox crossover."  Pt reports he has been advised to hold Coumadin 5 days prior to surgery.    VS: BP (!) 174/85   Pulse 62   Temp 36.8 C (Oral)   Resp 16   Ht 5\' 8"  (1.727 m)   SpO2 98%   BMI 32.69 kg/m   PROVIDERS: , PA-C is PCP   Lianne Moris, MD is Cardiologist  LABS: Labs reviewed: Acceptable for surgery. (all labs ordered are listed, but only abnormal results are displayed)  Labs Reviewed  BASIC METABOLIC PANEL - Abnormal; Notable for the following components:      Result Value   Glucose, Bld 100 (*)    Creatinine, Ser 0.60 (*)    All other components within normal limits  CBC - Abnormal; Notable for the following components:   Hemoglobin 11.8 (*)    HCT 38.7 (*)    RDW 15.6 (*)    All other components within normal limits     IMAGES:   EKG: 05/24/2020 Rate 76 bpm  Sinus rhythm with prolonged PR interval   CV: Echo 05/31/2020  1. Left ventricular ejection fraction, by estimation, is 65 to 70%. The  left ventricle has normal function. The left ventricle has no regional  wall motion  abnormalities. There is moderate asymmetric left ventricular  hypertrophy of the septal segment.  Left ventricular diastolic parameters are indeterminate.   2. RV-RA gradient normal at 18 mmHg. Right ventricular systolic function  is normal. The right ventricular size is normal.   3. The mitral valve is grossly normal. Trivial mitral valve  regurgitation.   4. The aortic valve is tricuspid. Aortic valve regurgitation is mild.  Mild to moderate aortic valve sclerosis/calcification is present, without  any evidence of aortic stenosis. Aortic valve area, by VTI measures 2.66  cm. Aortic valve mean gradient  measures 8.8 mmHg. Aortic valve Vmax measures 2.11 m/s.   5. Unable to estimate CVP.  Past Medical History:  Diagnosis Date   Aortic stenosis    Arthritis    Atrial fibrillation (HCC)    BPH (benign prostatic hyperplasia)    Essential hypertension    GERD (gastroesophageal reflux disease)    Heart murmur    History of DVT (deep vein thrombosis)    Positive anticardiolipin   History of hiatal hernia    History of stroke 2010   Lupus (HCC)    PAD (peripheral artery disease) (HCC)    Right below-knee amputation in 2019   Pulmonary embolism (HCC)    hx of 10 years ago   Shingles    Stroke [facility]    Tinnitus    Wears dentures  Wears glasses     Past Surgical History:  Procedure Laterality Date   ABDOMINAL AORTOGRAM W/LOWER EXTREMITY N/A 02/17/2018   Procedure: ABDOMINAL AORTOGRAM W/LOWER EXTREMITY;  Surgeon: Maeola Harman, MD;  Location: [facility] INVASIVE CV LAB;  Service: Cardiovascular;  Laterality: N/A;   ABDOMINAL AORTOGRAM W/LOWER EXTREMITY Left 12/30/2020   Procedure: ABDOMINAL AORTOGRAM W/LOWER EXTREMITY;  Surgeon: Sherren Kerns, MD;  Location: [facility];  Service: Cardiovascular;  Laterality: Left;   AMPUTATION Right 03/03/2018   Procedure: AMPUTATION RIGHT SECOND TOE;  Surgeon: Sherren Kerns, MD;  Location: [facility] OR;  Service: Vascular;  Laterality:  Right;   AMPUTATION Right 03/18/2018   Procedure: AMPUTATION BELOW KNEE;  Surgeon: Sherren Kerns, MD;  Location: [facility] OR;  Service: Vascular;  Laterality: Right;   BELOW KNEE LEG AMPUTATION Right 03/18/2018   bilateral cataract surgery      BIOPSY N/A 10/22/2017   Procedure: BIOPSY;  Surgeon: Franky Macho, MD;  [facility];  Service: Gastroenterology;  Laterality: N/A;   CATARACT EXTRACTION Bilateral 09/20/2016   COLONOSCOPY N/A 09/18/2016   Dr. Lovell Sheehan: normal   ESOPHAGOGASTRODUODENOSCOPY N/A 10/22/2017   Dr. Lovell Sheehan: normal duodenum, gastric mucosal atrophy but negative H.pylori (Clotest negative), normal GE junction   GIVENS CAPSULE STUDY N/A 01/27/2018   Procedure: GIVENS CAPSULE STUDY;  Surgeon: Corbin Ade, MD;  [facility];  Service: Endoscopy;  Laterality: N/A;  7:00am   HERNIA REPAIR     2010/laparoscopic/right   IVC FILTER INSERTION     01/2013    KNEE ARTHROSCOPY Right    2007   MULTIPLE TOOTH EXTRACTIONS     PERIPHERAL VASCULAR BALLOON ANGIOPLASTY  02/17/2018   Procedure: PERIPHERAL VASCULAR BALLOON ANGIOPLASTY;  Surgeon: Maeola Harman, MD;  Location: [facility] INVASIVE CV LAB;  Service: Cardiovascular;;   PERIPHERAL VASCULAR BALLOON ANGIOPLASTY Left 12/30/2020   Procedure: PERIPHERAL VASCULAR BALLOON ANGIOPLASTY;  Surgeon: Sherren Kerns, MD;  [facility]: [facility];  Service: Cardiovascular;  Laterality: Left;  PT   TOTAL HIP ARTHROPLASTY Right 05/01/2017   Procedure: RIGHT TOTAL HIP ARTHROPLASTY ANTERIOR APPROACH;  Surgeon: Ollen Gross, MD;  Location: [facility];  Service: Orthopedics;  Laterality: Right;   TOTAL KNEE ARTHROPLASTY Left 12/13/2014   Procedure: LEFT TOTAL KNEE ARTHROPLASTY;  Surgeon: Ollen Gross, MD;  Location: [facility];  Service: Orthopedics;  Laterality: Left;   TOTAL KNEE ARTHROPLASTY Right 03/21/2015   Procedure: RIGHT TOTAL KNEE ARTHROPLASTY;  Surgeon: Ollen Gross, MD;  Location: [facility];  Service: Orthopedics;   Laterality: Right;    MEDICATIONS:  acetaminophen (TYLENOL) 650 MG CR tablet   Bioflavonoid Products (ESTER C PO)   clopidogrel (PLAVIX) 75 MG tablet   docusate sodium (COLACE) 100 MG capsule   finasteride (PROSCAR) 5 MG tablet   hydroxychloroquine (PLAQUENIL) 200 MG tablet   lisinopril-hydrochlorothiazide (PRINZIDE,ZESTORETIC) 10-12.5 MG per tablet   Multiple Vitamin (MULTIVITAMIN WITH MINERALS) TABS tablet   Omega-3 350 MG CPDR   omeprazole (PRILOSEC) 20 MG capsule   predniSONE (DELTASONE) 5 MG tablet   tamsulosin (FLOMAX) 0.4 MG CAPS capsule   traMADol (ULTRAM) 50 MG tablet   warfarin (COUMADIN) 5 MG tablet   No current facility-administered medications for this encounter.     Jodell Cipro, PA-C [facility] [phone]

## 2021-04-19 NOTE — H&P (Signed)
H&P  Chief Complaint:  BPH  History of Present Illness:  80 year old male presents this time for TURP.  He has a history of BPH and underwent Urolift procedure in October of 2021.  Despite that, he is still significantly symptomatic and does not empty appropriately.  We have decided on further management and he has chosen TURP.  Past Medical History:  Diagnosis Date   Aortic stenosis    Arthritis    Atrial fibrillation (HCC)    BPH (benign prostatic hyperplasia)    Essential hypertension    GERD (gastroesophageal reflux disease)    Heart murmur    History of DVT (deep vein thrombosis)    Positive anticardiolipin   History of hiatal hernia    History of stroke 2010   Lupus (HCC)    PAD (peripheral artery disease) (HCC)    Right below-knee amputation in 2019   Pulmonary embolism (HCC)    hx of 10 years ago   Shingles    Stroke [facility])    Tinnitus    Wears dentures    Wears glasses     Past Surgical History:  Procedure Laterality Date   ABDOMINAL AORTOGRAM W/LOWER EXTREMITY N/A 02/17/2018   Procedure: ABDOMINAL AORTOGRAM W/LOWER EXTREMITY;  Surgeon: Maeola Harman, MD;  Location: [facility] INVASIVE CV LAB;  Service: Cardiovascular;  Laterality: N/A;   ABDOMINAL AORTOGRAM W/LOWER EXTREMITY Left 12/30/2020   Procedure: ABDOMINAL AORTOGRAM W/LOWER EXTREMITY;  Surgeon: Sherren Kerns, MD;  Location: [facility] INVASIVE CV LAB;  Service: Cardiovascular;  Laterality: Left;   AMPUTATION Right 03/03/2018   Procedure: AMPUTATION RIGHT SECOND TOE;  Surgeon: Sherren Kerns, MD;  Location: [facility] OR;  Service: Vascular;  Laterality: Right;   AMPUTATION Right 03/18/2018   Procedure: AMPUTATION BELOW KNEE;  Surgeon: Sherren Kerns, MD;  Location: [facility] OR;  Service: Vascular;  Laterality: Right;   BELOW KNEE LEG AMPUTATION Right 03/18/2018   bilateral cataract surgery      BIOPSY N/A 10/22/2017   Procedure: BIOPSY;  Surgeon: Franky Macho, MD;  Location: [facility] ENDO SUITE;  Service: Gastroenterology;   Laterality: N/A;   CATARACT EXTRACTION Bilateral 09/20/2016   COLONOSCOPY N/A 09/18/2016   Dr. Lovell Sheehan: normal   ESOPHAGOGASTRODUODENOSCOPY N/A 10/22/2017   Dr. Lovell Sheehan: normal duodenum, gastric mucosal atrophy but negative H.pylori (Clotest negative), normal GE junction   GIVENS CAPSULE STUDY N/A 01/27/2018   Procedure: GIVENS CAPSULE STUDY;  Surgeon: Corbin Ade, MD;  Location: [facility] ENDO SUITE;  Service: Endoscopy;  Laterality: N/A;  7:00am   HERNIA REPAIR     2010/laparoscopic/right   IVC FILTER INSERTION     01/2013    KNEE ARTHROSCOPY Right    2007   MULTIPLE TOOTH EXTRACTIONS     PERIPHERAL VASCULAR BALLOON ANGIOPLASTY  02/17/2018   Procedure: PERIPHERAL VASCULAR BALLOON ANGIOPLASTY;  Surgeon: Maeola Harman, MD;  Location: [facility] INVASIVE CV LAB;  Service: Cardiovascular;;   PERIPHERAL VASCULAR BALLOON ANGIOPLASTY Left 12/30/2020   Procedure: PERIPHERAL VASCULAR BALLOON ANGIOPLASTY;  Surgeon: Sherren Kerns, MD;  Location: [facility] INVASIVE CV LAB;  Service: Cardiovascular;  Laterality: Left;  PT   TOTAL HIP ARTHROPLASTY Right 05/01/2017   Procedure: RIGHT TOTAL HIP ARTHROPLASTY ANTERIOR APPROACH;  Surgeon: Ollen Gross, MD;  Location: [facility] ORS;  Service: Orthopedics;  Laterality: Right;   TOTAL KNEE ARTHROPLASTY Left 12/13/2014   Procedure: LEFT TOTAL KNEE ARTHROPLASTY;  Surgeon: Ollen Gross, MD;  Location: [facility] ORS;  Service: Orthopedics;  Laterality: Left;   TOTAL KNEE ARTHROPLASTY Right 03/21/2015  Procedure: RIGHT TOTAL KNEE ARTHROPLASTY;  Surgeon: Ollen Gross, MD;  Location: [facility] ORS;  Service: Orthopedics;  Laterality: Right;    Home Medications:  Allergies as of 04/19/2021       Reactions   Sulfa Antibiotics    Weak and dizziness        Medication List      Notice   Cannot display discharge medications because the patient has not yet been admitted.     Allergies:  Allergies  Allergen Reactions   Sulfa Antibiotics     Weak and dizziness    Family History   Problem Relation Age of Onset   Hypertension Other    Stroke Mother    Stroke Father    Heart failure Sister    CAD Brother        CABG age 72   Colon cancer Neg Hx     Social History:  reports that he quit smoking about 37 years ago. His smoking use included cigarettes. He has a 20.00 pack-year smoking history. He has never used smokeless tobacco. He reports that he does not drink alcohol and does not use drugs.  ROS: A complete review of systems was performed.  All systems are negative except for pertinent findings as noted.  Physical Exam:  Vital signs in last 24 hours: There were no vitals taken for this visit. Constitutional:  Alert and oriented, No acute distress Cardiovascular: Regular rate  Respiratory: Normal respiratory effort GI: Abdomen is soft, nontender, nondistended, no abdominal masses. No CVAT.  Genitourinary: Normal male phallus, testes are descended bilaterally and non-tender and without masses, scrotum is normal in appearance without lesions or masses, perineum is normal on inspection. Lymphatic: No lymphadenopathy Neurologic: Grossly intact, no focal deficits Psychiatric: Normal mood and affect  Laboratory Data:  No results for input(s): WBC, HGB, HCT, PLT in the last 72 hours.  No results for input(s): NA, K, CL, GLUCOSE, BUN, CALCIUM, CREATININE in the last 72 hours.  Invalid input(s): CO3   No results found for this or any previous visit (from the past 24 hour(s)). Recent Results (from the past 240 hour(s))  SARS CORONAVIRUS 2 (TAT 6-24 HRS) Nasopharyngeal Nasopharyngeal Swab     Status: None   Collection Time: 04/17/21  7:27 AM   Specimen: Nasopharyngeal Swab  Result Value Ref Range Status   SARS Coronavirus 2 NEGATIVE NEGATIVE Final    Comment: (NOTE) SARS-CoV-2 target nucleic acids are NOT DETECTED.  The SARS-CoV-2 RNA is generally detectable in upper and lower respiratory specimens during the acute phase of infection. Negative results do  not preclude SARS-CoV-2 infection, do not rule out co-infections with other pathogens, and should not be used as the sole basis for treatment or other patient management decisions. Negative results must be combined with clinical observations, patient history, and epidemiological information. The expected result is Negative.  Fact Sheet for Patients: HairSlick.no  Fact Sheet for Healthcare Providers: quierodirigir.com  This test is not yet approved or cleared by the [place] FDA and  has been authorized for detection and/or diagnosis of SARS-CoV-2 by FDA under an Emergency Use Authorization (EUA). This EUA will remain  in effect (meaning this test can be used) for the duration of the COVID-19 declaration under Se ction 564(b)(1) of the Act, 21 U.S.C. section 360bbb-3(b)(1), unless the authorization is terminated or revoked sooner.  Performed at [facility], [address] [place], [place] [address]     Renal Function: No results for input(s): CREATININE in  the last 168 hours. Estimated Creatinine Clearance: 83.9 mL/min (A) (by C-G formula based on SCr of 0.6 mg/dL (L)).  Radiologic Imaging: No results found.  Impression/Assessment:    BPH with obstruction  Plan:   Trans urethral resection of prostate

## 2021-04-19 NOTE — Anesthesia Preprocedure Evaluation (Addendum)
Anesthesia Evaluation  Patient identified by MRN, date of birth, ID band Patient awake    Reviewed: Allergy & Precautions, NPO status , Patient's Chart, lab work & pertinent test results  Airway Mallampati: II  TM Distance: >3 FB Neck ROM: Full    Dental no notable dental hx. (+) Upper Dentures, Lower Dentures   Pulmonary former smoker,    Pulmonary exam normal breath sounds clear to auscultation       Cardiovascular hypertension, Pt. on medications + Peripheral Vascular Disease  Normal cardiovascular exam+ dysrhythmias Atrial Fibrillation  Rhythm:Regular Rate:Normal  8/21`TEE 1. Left ventricular ejection fraction, by estimation, is 65 to 70%. The left ventricle has normal function. The left ventricle has no regional wall motion abnormalities. There is moderate asymmetric left ventricular hypertrophy of the septal segment. Left ventricular diastolic parameters are indeterminate. 2. RV-RA gradient normal at 18 mmHg. Right ventricular systolic function is normal. The right ventricular size is normal. 3. The mitral valve is grossly normal. Trivial mitral valve regurgitation. 4. The aortic valve is tricuspid. Aortic valve regurgitation is mild. Mild to moderate aortic valve sclerosis/calcification is present, without any evidence of aortic stenosis. Aortic valve area, by VTI measures 2.66 cm. Aortic valve mean gradient measures 8.8 mmHg. Aortic valve Vmax measures 2.11 m/s. 5. Unable to estimate CVP.   Neuro/Psych CVA (L sided weakness), Residual Symptoms    GI/Hepatic hiatal hernia, GERD  ,  Endo/Other  negative endocrine ROS  Renal/GU negative Renal ROSLab Results      Component                Value               Date                      CREATININE               0.60 (L)            04/12/2021                BUN                      9                   04/12/2021                NA                       139                  04/12/2021                K                        4.5                 04/12/2021                CL                       104                 04/12/2021                CO2  29                  04/12/2021              Prostate hyperplasia    Musculoskeletal  (+) Arthritis ,   Abdominal (+) + obese (BMI 33.08),   Peds  Hematology  (+) anemia , Lab Results      Component                Value               Date                      WBC                      8.0                 04/12/2021                HGB                      11.8 (L)            04/12/2021                HCT                      38.7 (L)            04/12/2021                MCV                      85.8                04/12/2021                PLT                      200                 04/12/2021              Anesthesia Other Findings All Sulfa  R BKA  Reproductive/Obstetrics                            Anesthesia Physical Anesthesia Plan  ASA: 3  Anesthesia Plan: General   Post-op Pain Management:    Induction: Intravenous  PONV Risk Score and Plan: Treatment may vary due to age or medical condition and Ondansetron  Airway Management Planned: LMA  Additional Equipment: None  Intra-op Plan:   Post-operative Plan:   Informed Consent: I have reviewed the patients History and Physical, chart, labs and discussed the procedure including the risks, benefits and alternatives for the proposed anesthesia with the patient or authorized representative who has indicated his/her understanding and acceptance.     Dental advisory given  Plan Discussed with: CRNA  Anesthesia Plan Comments:         Anesthesia Quick Evaluation

## 2021-04-20 ENCOUNTER — Observation Stay (HOSPITAL_COMMUNITY)
Admission: RE | Admit: 2021-04-20 | Discharge: 2021-04-21 | Disposition: A | Discharge status: Home / Self Care | Payer: Medicare HMO | Source: Ambulatory Visit | Attending: Urology | Admitting: Urology

## 2021-04-20 ENCOUNTER — Encounter (HOSPITAL_COMMUNITY)
Admission: RE | Disposition: A | Discharge status: Home / Self Care | Payer: Self-pay | Source: Ambulatory Visit | Attending: Urology

## 2021-04-20 ENCOUNTER — Ambulatory Visit (HOSPITAL_COMMUNITY): Payer: Medicare HMO | Admitting: Anesthesiology

## 2021-04-20 ENCOUNTER — Other Ambulatory Visit: Payer: Self-pay

## 2021-04-20 ENCOUNTER — Ambulatory Visit (HOSPITAL_COMMUNITY): Payer: Medicare HMO | Admitting: Physician Assistant

## 2021-04-20 DIAGNOSIS — Z7901 Long term (current) use of anticoagulants: Secondary | ICD-10-CM | POA: Diagnosis not present

## 2021-04-20 DIAGNOSIS — Z7902 Long term (current) use of antithrombotics/antiplatelets: Secondary | ICD-10-CM | POA: Insufficient documentation

## 2021-04-20 DIAGNOSIS — N320 Bladder-neck obstruction: Secondary | ICD-10-CM | POA: Diagnosis not present

## 2021-04-20 DIAGNOSIS — N400 Enlarged prostate without lower urinary tract symptoms: Secondary | ICD-10-CM | POA: Diagnosis not present

## 2021-04-20 DIAGNOSIS — Z79899 Other long term (current) drug therapy: Secondary | ICD-10-CM | POA: Diagnosis not present

## 2021-04-20 DIAGNOSIS — Z96653 Presence of artificial knee joint, bilateral: Secondary | ICD-10-CM | POA: Diagnosis not present

## 2021-04-20 DIAGNOSIS — N401 Enlarged prostate with lower urinary tract symptoms: Secondary | ICD-10-CM | POA: Diagnosis not present

## 2021-04-20 DIAGNOSIS — Z87891 Personal history of nicotine dependence: Secondary | ICD-10-CM | POA: Insufficient documentation

## 2021-04-20 DIAGNOSIS — N138 Other obstructive and reflux uropathy: Secondary | ICD-10-CM | POA: Diagnosis present

## 2021-04-20 DIAGNOSIS — Z96641 Presence of right artificial hip joint: Secondary | ICD-10-CM | POA: Diagnosis not present

## 2021-04-20 DIAGNOSIS — D509 Iron deficiency anemia, unspecified: Secondary | ICD-10-CM | POA: Diagnosis not present

## 2021-04-20 DIAGNOSIS — I10 Essential (primary) hypertension: Secondary | ICD-10-CM | POA: Insufficient documentation

## 2021-04-20 HISTORY — PX: TRANSURETHRAL RESECTION OF PROSTATE: SHX73

## 2021-04-20 LAB — PROTIME-INR
INR: 1.1 (ref 0.8–1.2)
Prothrombin Time: 13.8 seconds (ref 11.4–15.2)

## 2021-04-20 SURGERY — TURP (TRANSURETHRAL RESECTION OF PROSTATE)
Anesthesia: General

## 2021-04-20 MED ORDER — 0.9 % SODIUM CHLORIDE (POUR BTL) OPTIME
TOPICAL | Status: DC | PRN
  Administered 2021-04-20: 1000 mL

## 2021-04-20 MED ORDER — LIDOCAINE HCL (CARDIAC) PF 100 MG/5ML IV SOSY
PREFILLED_SYRINGE | INTRAVENOUS | Status: DC | PRN
  Administered 2021-04-20: 100 mg via INTRAVENOUS

## 2021-04-20 MED ORDER — HYDROMORPHONE HCL 1 MG/ML IJ SOLN
0.2500 mg | INTRAMUSCULAR | Status: DC | PRN
  Administered 2021-04-20 (×3): 0.25 mg via INTRAVENOUS

## 2021-04-20 MED ORDER — SODIUM CHLORIDE 0.45 % IV SOLN: INTRAVENOUS | Status: DC

## 2021-04-20 MED ORDER — PROPOFOL 10 MG/ML IV BOLUS
INTRAVENOUS | Status: DC | PRN
  Administered 2021-04-20: 90 mg via INTRAVENOUS
  Administered 2021-04-20 (×2): 50 mg via INTRAVENOUS
  Administered 2021-04-20: 60 mg via INTRAVENOUS

## 2021-04-20 MED ORDER — ONDANSETRON HCL 4 MG/2ML IJ SOLN: 4.0000 mg | Freq: Once | INTRAMUSCULAR | Status: DC | PRN

## 2021-04-20 MED ORDER — PREDNISONE 5 MG PO TABS
5.0000 mg | ORAL_TABLET | Freq: Every day | ORAL | Status: DC
  Administered 2021-04-21: 5 mg via ORAL
  Filled 2021-04-20: qty 1

## 2021-04-20 MED ORDER — LISINOPRIL 10 MG PO TABS
10.0000 mg | ORAL_TABLET | Freq: Every day | ORAL | Status: DC
  Administered 2021-04-20 – 2021-04-21 (×2): 10 mg via ORAL
  Filled 2021-04-20 (×2): qty 1

## 2021-04-20 MED ORDER — FENTANYL CITRATE (PF) 100 MCG/2ML IJ SOLN
INTRAMUSCULAR | Status: DC | PRN
  Administered 2021-04-20 (×2): 50 ug via INTRAVENOUS

## 2021-04-20 MED ORDER — HYDROCHLOROTHIAZIDE 12.5 MG PO CAPS
12.5000 mg | ORAL_CAPSULE | Freq: Every day | ORAL | Status: DC
  Administered 2021-04-20 – 2021-04-21 (×2): 12.5 mg via ORAL
  Filled 2021-04-20 (×2): qty 1

## 2021-04-20 MED ORDER — HYDROMORPHONE HCL 1 MG/ML IJ SOLN
INTRAMUSCULAR | Status: AC
  Administered 2021-04-20: 0.25 mg via INTRAVENOUS
  Filled 2021-04-20: qty 1

## 2021-04-20 MED ORDER — BELLADONNA ALKALOIDS-OPIUM 16.2-60 MG RE SUPP
1.0000 | Freq: Four times a day (QID) | RECTAL | Status: DC | PRN
  Administered 2021-04-20: 1 via RECTAL
  Filled 2021-04-20: qty 1

## 2021-04-20 MED ORDER — HYDROXYCHLOROQUINE SULFATE 200 MG PO TABS
200.0000 mg | ORAL_TABLET | Freq: Every day | ORAL | Status: DC
  Administered 2021-04-20 – 2021-04-21 (×2): 200 mg via ORAL
  Filled 2021-04-20 (×2): qty 1

## 2021-04-20 MED ORDER — SODIUM CHLORIDE 0.9 % IR SOLN: 3000.0000 mL | Status: DC

## 2021-04-20 MED ORDER — LIDOCAINE 2% (20 MG/ML) 5 ML SYRINGE
INTRAMUSCULAR | Status: AC
  Filled 2021-04-20: qty 5

## 2021-04-20 MED ORDER — PANTOPRAZOLE SODIUM 40 MG PO TBEC
40.0000 mg | DELAYED_RELEASE_TABLET | Freq: Every day | ORAL | Status: DC
  Administered 2021-04-20 – 2021-04-21 (×2): 40 mg via ORAL
  Filled 2021-04-20 (×2): qty 1

## 2021-04-20 MED ORDER — HYDROCODONE-ACETAMINOPHEN 5-325 MG PO TABS
1.0000 | ORAL_TABLET | ORAL | Status: DC | PRN
  Administered 2021-04-20 – 2021-04-21 (×2): 2 via ORAL
  Filled 2021-04-20 (×2): qty 2

## 2021-04-20 MED ORDER — DEXAMETHASONE SODIUM PHOSPHATE 10 MG/ML IJ SOLN
INTRAMUSCULAR | Status: AC
  Filled 2021-04-20: qty 1

## 2021-04-20 MED ORDER — FENTANYL CITRATE (PF) 100 MCG/2ML IJ SOLN
INTRAMUSCULAR | Status: AC
  Filled 2021-04-20: qty 2

## 2021-04-20 MED ORDER — FENTANYL CITRATE (PF) 100 MCG/2ML IJ SOLN
25.0000 ug | INTRAMUSCULAR | Status: DC | PRN
  Administered 2021-04-20 (×2): 50 ug via INTRAVENOUS

## 2021-04-20 MED ORDER — PROPOFOL 10 MG/ML IV BOLUS
INTRAVENOUS | Status: AC
  Filled 2021-04-20: qty 20

## 2021-04-20 MED ORDER — LACTATED RINGERS IV SOLN: INTRAVENOUS | Status: DC

## 2021-04-20 MED ORDER — SODIUM CHLORIDE 0.9 % IR SOLN
Status: DC | PRN
  Administered 2021-04-20: 21000 mL via INTRAVESICAL

## 2021-04-20 MED ORDER — CHLORHEXIDINE GLUCONATE 0.12 % MT SOLN
15.0000 mL | Freq: Once | OROMUCOSAL | Status: AC
  Administered 2021-04-20: 15 mL via OROMUCOSAL

## 2021-04-20 MED ORDER — FENTANYL CITRATE (PF) 100 MCG/2ML IJ SOLN
INTRAMUSCULAR | Status: AC
  Administered 2021-04-20: 50 ug via INTRAVENOUS
  Filled 2021-04-20: qty 2

## 2021-04-20 MED ORDER — CHLORHEXIDINE GLUCONATE CLOTH 2 % EX PADS
6.0000 | MEDICATED_PAD | Freq: Every day | CUTANEOUS | Status: DC
  Administered 2021-04-21: 6 via TOPICAL

## 2021-04-20 MED ORDER — ONDANSETRON HCL 4 MG/2ML IJ SOLN
INTRAMUSCULAR | Status: AC
  Filled 2021-04-20: qty 2

## 2021-04-20 MED ORDER — CEPHALEXIN 500 MG PO CAPS
500.0000 mg | ORAL_CAPSULE | Freq: Two times a day (BID) | ORAL | Status: DC
  Administered 2021-04-20 – 2021-04-21 (×2): 500 mg via ORAL
  Filled 2021-04-20 (×2): qty 1

## 2021-04-20 MED ORDER — LISINOPRIL-HYDROCHLOROTHIAZIDE 10-12.5 MG PO TABS: 1.0000 | ORAL_TABLET | Freq: Every day | ORAL | Status: DC

## 2021-04-20 MED ORDER — ACETAMINOPHEN 10 MG/ML IV SOLN: 1000.0000 mg | Freq: Once | INTRAVENOUS | Status: DC | PRN

## 2021-04-20 MED ORDER — ACETAMINOPHEN 325 MG PO TABS: 650.0000 mg | ORAL_TABLET | ORAL | Status: DC | PRN

## 2021-04-20 MED ORDER — ACETAMINOPHEN 10 MG/ML IV SOLN
INTRAVENOUS | Status: AC
  Administered 2021-04-20: 1000 mg via INTRAVENOUS
  Filled 2021-04-20: qty 100

## 2021-04-20 MED ORDER — ONDANSETRON HCL 4 MG/2ML IJ SOLN
INTRAMUSCULAR | Status: DC | PRN
  Administered 2021-04-20: 4 mg via INTRAVENOUS

## 2021-04-20 MED ORDER — ORAL CARE MOUTH RINSE: 15.0000 mL | Freq: Once | OROMUCOSAL | Status: AC

## 2021-04-20 MED ORDER — BELLADONNA ALKALOIDS-OPIUM 16.2-60 MG RE SUPP
RECTAL | Status: AC
  Administered 2021-04-20: 1 via RECTAL
  Filled 2021-04-20: qty 1

## 2021-04-20 MED ORDER — SENNA 8.6 MG PO TABS
1.0000 | ORAL_TABLET | Freq: Two times a day (BID) | ORAL | Status: DC
  Administered 2021-04-20 – 2021-04-21 (×2): 8.6 mg via ORAL
  Filled 2021-04-20 (×2): qty 1

## 2021-04-20 MED ORDER — DEXAMETHASONE SODIUM PHOSPHATE 4 MG/ML IJ SOLN
INTRAMUSCULAR | Status: DC | PRN
  Administered 2021-04-20: 5 mg via INTRAVENOUS

## 2021-04-20 MED ORDER — SODIUM CHLORIDE 0.9 % IV SOLN
2.0000 g | INTRAVENOUS | Status: AC
  Administered 2021-04-20: 2 g via INTRAVENOUS
  Filled 2021-04-20: qty 2

## 2021-04-20 SURGICAL SUPPLY — 21 items
BAG DRN RND TRDRP ANRFLXCHMBR (UROLOGICAL SUPPLIES) ×1
BAG URINE DRAIN 2000ML AR STRL (UROLOGICAL SUPPLIES) ×2 IMPLANT
BAG URO CATCHER STRL LF (MISCELLANEOUS) ×2 IMPLANT
BLADE SURG 15 STRL LF DISP TIS (BLADE) IMPLANT
BLADE SURG 15 STRL SS (BLADE)
CATH FOLEY 3WAY 30CC 22FR (CATHETERS) ×2 IMPLANT
CATH HEMA 3WAY 30CC 22FR COUDE (CATHETERS) IMPLANT
DRAPE FOOT SWITCH (DRAPES) ×2 IMPLANT
EVACUATOR MICROVAS BLADDER (UROLOGICAL SUPPLIES) ×2 IMPLANT
GLOVE SURG ENC TEXT LTX SZ8 (GLOVE) ×2 IMPLANT
GOWN STRL REUS W/TWL XL LVL3 (GOWN DISPOSABLE) ×2 IMPLANT
HOLDER FOLEY CATH W/STRAP (MISCELLANEOUS) ×1 IMPLANT
KIT TURNOVER KIT A (KITS) ×2 IMPLANT
LOOP CUT BIPOLAR 24F LRG (ELECTROSURGICAL) ×2 IMPLANT
MANIFOLD NEPTUNE II (INSTRUMENTS) ×2 IMPLANT
PACK CYSTO (CUSTOM PROCEDURE TRAY) ×2 IMPLANT
PENCIL SMOKE EVACUATOR (MISCELLANEOUS) IMPLANT
SUT ETHILON 3 0 PS 1 (SUTURE) IMPLANT
SYR 30ML LL (SYRINGE) ×1 IMPLANT
TUBING CONNECTING 10 (TUBING) ×2 IMPLANT
TUBING UROLOGY SET (TUBING) ×2 IMPLANT

## 2021-04-20 NOTE — Op Note (Signed)
Preoperative diagnosis: Bladder outlet obstruction secondary to BPH  Postoperative diagnosis:  Bladder outlet obstruction secondary to BPH  Procedure:  Cystoscopy Transurethral resection of the prostate  Surgeon: Bertram Millard. Tiandre Teall, M.D.  Anesthesia: General  Complications: None  Drain: Foley catheter  EBL: Minimal  Specimens: Prostate chips  Disposition of specimens: Pathology  Indication: Jose Bush is a patient with bladder outlet obstruction secondary to benign prostatic hyperplasia. After reviewing the management options for treatment, he elected to proceed with the above surgical procedure(s). We have discussed the potential benefits and risks of the procedure, side effects of the proposed treatment, the likelihood of the patient achieving the goals of the procedure, and any potential problems that might occur during the procedure or recuperation. Informed consent has been obtained.  Description of procedure:  The patient was identified in the holding area. He received preoperative antibiotics. He was then taken to the operating room. General anesthetic was administered.  The patient was then placed in the dorsal lithotomy position, prepped and draped in the usual sterile fashion. Timeout was then performed.  A resectoscope sheath was placed using the visual obturator.   The bladder was then systematically examined in its entirety. There was no evidence of  tumors, stones, or other mucosal pathology.The resectoscope, loop and telescope were then placed.  The ureteral orifices were identified so as to be avoided during the procedure.  The prostate adenoma was then resected utilizing loop cautery resection with the bipolar cutting loop.  The prostate adenoma from the bladder neck back to the verumontanum was resected beginning at the six o'clock position and then extended to include the right and left lobes of the prostate and anterior prostate, respectively. Care was  taken not to resect distal to the verumontanum.  Hemostasis was then achieved with the cautery and the bladder was emptied and reinspected with no significant bleeding noted at the end of the procedure.  Resected chips were irrigated from the bladder with the evacuator and sent to pathology.  A 3 way catheter was then placed into the bladder and placed on continuous bladder irrigation.  The patient appeared to tolerate the procedure well and without complications. The patient was able to be awakened and transferred to the recovery unit in satisfactory condition. He tolerated the procedure well.

## 2021-04-20 NOTE — Transfer of Care (Signed)
Immediate Anesthesia Transfer of Care Note  Patient: Jose Bush  Procedure(s) Performed: TRANSURETHRAL RESECTION OF THE PROSTATE (TURP)  Patient Location: PACU  Anesthesia Type:General  Level of Consciousness: awake, alert  and oriented  Airway & Oxygen Therapy: Patient Spontanous Breathing and Patient connected to nasal cannula oxygen  Post-op Assessment: Report given to RN and Post -op Vital signs reviewed and stable  Post vital signs: Reviewed and stable  Last Vitals:  Vitals Value Taken Time  BP 157/64 04/20/21 1250  Temp    Pulse 71 04/20/21 1252  Resp    SpO2 99 % 04/20/21 1252  Vitals shown include unvalidated device data.  Last Pain:  Vitals:   04/20/21 0858  TempSrc: Oral         Complications: No notable events documented.

## 2021-04-20 NOTE — Interval H&P Note (Signed)
History and Physical Interval Note:  04/20/2021 11:17 AM  Jose Bush  has presented today for surgery, with the diagnosis of BENIGN PROSTATIC HYPERPLASIA.  The various methods of treatment have been discussed with the patient and family. After consideration of risks, benefits and other options for treatment, the patient has consented to  Procedure(s) with comments: TRANSURETHRAL RESECTION OF THE PROSTATE (TURP) (N/A) - 90 MINS as a surgical intervention.  The patient's history has been reviewed, patient examined, no change in status, stable for surgery.  I have reviewed the patient's chart and labs.  Questions were answered to the patient's satisfaction.     [name]

## 2021-04-20 NOTE — Anesthesia Procedure Notes (Signed)
Procedure Name: LMA Insertion Date/Time: 04/20/2021 11:29 AM Performed by: Junious Silk, CRNA Pre-anesthesia Checklist: Patient identified, Emergency Drugs available, Suction available, Patient being monitored and Timeout performed Patient Re-evaluated:Patient Re-evaluated prior to induction Oxygen Delivery Method: Circle system utilized Preoxygenation: Pre-oxygenation with 100% oxygen Induction Type: IV induction LMA: LMA with gastric port inserted LMA Size: 4.0 Number of attempts: 1 Placement Confirmation: positive ETCO2, CO2 detector and breath sounds checked- equal and bilateral Tube secured with: Tape Dental Injury: Teeth and Oropharynx as per pre-operative assessment

## 2021-04-20 NOTE — Anesthesia Postprocedure Evaluation (Signed)
Anesthesia Post Note  Patient: Jose Bush  Procedure(s) Performed: TRANSURETHRAL RESECTION OF THE PROSTATE (TURP)     Patient location during evaluation: PACU Anesthesia Type: General Level of consciousness: awake and alert Pain management: pain level controlled Vital Signs Assessment: post-procedure vital signs reviewed and stable Respiratory status: spontaneous breathing, nonlabored ventilation, respiratory function stable and patient connected to nasal cannula oxygen Cardiovascular status: blood pressure returned to baseline and stable Postop Assessment: no apparent nausea or vomiting Anesthetic complications: no   No notable events documented.  Last Vitals:  Vitals:   04/20/21 1330 04/20/21 1345  BP: (!) 160/66 (!) 147/63  Pulse: 64 62  Resp:    Temp:  36.6 C  SpO2: 99% 99%    Last Pain:  Vitals:   04/20/21 1345  TempSrc:   PainSc: 4                  Trevor Iha

## 2021-04-20 NOTE — Discharge Instructions (Signed)
Transurethral Resection of the Prostate ° °Care After ° °Refer to this sheet in the next few weeks. These discharge instructions provide you with general information on caring for yourself after you leave the hospital. Your caregiver may also give you specific instructions. Your treatment has been planned according to the most current medical practices available, but unavoidable complications sometimes occur. If you have any problems or questions after discharge, please call your caregiver. ° °HOME CARE INSTRUCTIONS  ° °Medications °· You may receive medicine for pain management. As your level of discomfort decreases, adjustments in your pain medicines may be made.  °· Take all medicines as directed.  °· You may be given a medicine (antibiotic) to kill germs following surgery. Finish all medicines. Let your caregiver know if you have any side effects or problems from the medicine.  °· If you are on aspirin, it would be best not to restart the aspirin until the blood in the urine clears °Hygiene °· You can take a shower after surgery.  °· You should not take a bath while you still have the urethral catheter. °Activity °· You will be encouraged to get out of bed as much as possible and increase your activity level as tolerated.  °· Spend the first week in and around your home. For 3 weeks, avoid the following:  °· Straining.  °· Running.  °· Strenuous work.  °· Walks longer than a few blocks.  °· Riding for extended periods.  °· Sexual relations.  °· Do not lift heavy objects (more than 20 pounds) for at least 1 month. When lifting, use your arms instead of your abdominal muscles.  °· You will be encouraged to walk as tolerated. Do not exert yourself. Increase your activity level slowly. Remember that it is important to keep moving after an operation of any type. This cuts down on the possibility of developing blood clots.  °· Your caregiver will tell you when you can resume driving and light housework. Discuss this  at your first office visit after discharge. °Diet °· No special diet is ordered after a TURP. However, if you are on a special diet for another medical problem, it should be continued.  °· Normal fluid intake is usually recommended.  °· Avoid alcohol and caffeinated drinks for 2 weeks. They irritate the bladder. Decaffeinated drinks are okay.  °· Avoid spicy foods.  °Bladder Function °· For the first 10 days, empty the bladder whenever you feel a definite desire. Do not try to hold the urine for long periods of time.  °· Urinating once or twice a night even after you are healed is not uncommon.  °· You may see some recurrence of blood in the urine after discharge from the hospital. This usually happens within 2 weeks after the procedure.If this occurs, force fluids again as you did in the hospital and reduce your activity.  °Bowel Function °· You may experience some constipation after surgery. This can be minimized by increasing fluids and fiber in your diet. Drink enough water and fluids to keep your urine clear or pale yellow.  °· A stool softener may be prescribed for use at home. Do not strain to move your bowels.  °· If you are requiring increased pain medicine, it is important that you take stool softeners to prevent constipation. This will help to promote proper healing by reducing the need to strain to move your bowels.  °Sexual Activity °· Semen movement in the opposite direction and into the bladder (  retrograde ejaculation) may occur. Since the semen passes into the bladder, cloudy urine can occur the first time you urinate after intercourse. Or, you may not have an ejaculation during erection. Ask your caregiver when you can resume sexual activity. Retrograde ejaculation and reduced semen discharge should not reduce one's pleasure of intercourse.  °Postoperative Visit °· Arrange the date and time of your after surgery visit with your caregiver.  °Return to Work °· After your recovery is complete, you will  be able to return to work and resume all activities. Your caregiver will inform you when you can return to work.  °Foley Catheter Care °A soft, flexible tube (Foley catheter) may have been placed in your bladder to drain urine and fluid. Follow these instructions: °Taking Care of the Catheter °· Keep the area where the catheter leaves your body clean.  °· Attach the catheter to the leg so there is no tension on the catheter.  °· Keep the drainage bag below the level of the bladder, but keep it OFF the floor.  °· Do not take long soaking baths. Your caregiver will give instructions about showering.  °· Wash your hands before touching ANYTHING related to the catheter or bag.  °· Using mild soap and warm water on a washcloth:  °· Clean the area closest to the catheter insertion site using a circular motion around the catheter.  °· Clean the catheter itself by wiping AWAY from the insertion site for several inches down the tube.  °· NEVER wipe upward as this could sweep bacteria up into the urethra (tube in your body that normally drains the bladder) and cause infection.  °· Place a small amount of sterile lubricant at the tip of the penis where the catheter is entering.  °Taking Care of the Drainage Bags °· Two drainage bags may be taken home: a large overnight drainage bag, and a smaller leg bag which fits underneath clothing.  °· It is okay to wear the overnight bag at any time, but NEVER wear the smaller leg bag at night.  °· Keep the drainage bag well below the level of your bladder. This prevents backflow of urine into the bladder and allows the urine to drain freely.  °· Anchor the tubing to your leg to prevent pulling or tension on the catheter. Use tape or a leg strap provided by the hospital.  °· Empty the drainage bag when it is 1/2 to 3/4 full. Wash your hands before and after touching the bag.  °· Periodically check the tubing for kinks to make sure there is no pressure on the tubing which could restrict  the flow of urine.  °Changing the Drainage Bags °· Cleanse both ends of the clean bag with alcohol before changing.  °· Pinch off the rubber catheter to avoid urine spillage during the disconnection.  °· Disconnect the dirty bag and connect the clean one.  °· Empty the dirty bag carefully to avoid a urine spill.  °· Attach the new bag to the leg with tape or a leg strap.  °Cleaning the Drainage Bags °· Whenever a drainage bag is disconnected, it must be cleaned quickly so it is ready for the next use.  °· Wash the bag in warm, soapy water.  °· Rinse the bag thoroughly with warm water.  °· Soak the bag for 30 minutes in a solution of white vinegar and water (1 cup vinegar to 1 quart warm water).  °· Rinse with warm water.  °SEEK MEDICAL   CARE IF:  °· You have chills or night sweats.  °· You are leaking around your catheter or have problems with your catheter. It is not uncommon to have sporadic leakage around your catheter as a result of bladder spasms. If the leakage stops, there is not much need for concern. If you are uncertain, call your caregiver.  °· You develop side effects that you think are coming from your medicines.  °SEEK IMMEDIATE MEDICAL CARE IF:  °· You are suddenly unable to urinate. Check to see if there are any kinks in the drainage tubing that may cause this. If you cannot find any kinks, call your caregiver immediately. This is an emergency.  °· You develop shortness of breath or chest pains.  °· Bleeding persists or clots develop in your urine.  °· You have a fever.  °· You develop pain in your back or over your lower belly (abdomen).  °· You develop pain or swelling in your legs.  °· Any problems you are having get worse rather than better.  °MAKE SURE YOU:  °· Understand these instructions.  °· Will watch your condition.  °· Will get help right away if you are not doing well or get worse.  °Document Released: 09/17/2005 Document Revised: 05/30/2011 Document Reviewed: 05/11/2009 °[employer]®  Patient Information ©2012 [employer].Transurethral Resection of the Prostate °Care After °Refer to this sheet in the next few weeks. These discharge instructions provide you with general information on caring for yourself after you leave the hospital. Your caregiver may also give you specific instructions. Your treatment has been planned according to the most current medical practices available, but unavoidable complications sometimes occur. If you have any problems or questions after discharge, please call your caregiver. °

## 2021-04-20 NOTE — Plan of Care (Signed)
Pt oriented to room, call bell, and telephone.   

## 2021-04-21 ENCOUNTER — Encounter (HOSPITAL_COMMUNITY): Payer: Self-pay | Admitting: Urology

## 2021-04-21 DIAGNOSIS — Z7901 Long term (current) use of anticoagulants: Secondary | ICD-10-CM | POA: Diagnosis not present

## 2021-04-21 DIAGNOSIS — Z7902 Long term (current) use of antithrombotics/antiplatelets: Secondary | ICD-10-CM | POA: Diagnosis not present

## 2021-04-21 DIAGNOSIS — Z96641 Presence of right artificial hip joint: Secondary | ICD-10-CM | POA: Diagnosis not present

## 2021-04-21 DIAGNOSIS — Z96653 Presence of artificial knee joint, bilateral: Secondary | ICD-10-CM | POA: Diagnosis not present

## 2021-04-21 DIAGNOSIS — I10 Essential (primary) hypertension: Secondary | ICD-10-CM | POA: Diagnosis not present

## 2021-04-21 DIAGNOSIS — N138 Other obstructive and reflux uropathy: Secondary | ICD-10-CM | POA: Diagnosis not present

## 2021-04-21 DIAGNOSIS — Z79899 Other long term (current) drug therapy: Secondary | ICD-10-CM | POA: Diagnosis not present

## 2021-04-21 DIAGNOSIS — N401 Enlarged prostate with lower urinary tract symptoms: Secondary | ICD-10-CM | POA: Diagnosis not present

## 2021-04-21 DIAGNOSIS — Z87891 Personal history of nicotine dependence: Secondary | ICD-10-CM | POA: Diagnosis not present

## 2021-04-21 LAB — SURGICAL PATHOLOGY

## 2021-04-21 MED ORDER — CEPHALEXIN 500 MG PO CAPS: 500.0000 mg | ORAL_CAPSULE | Freq: Two times a day (BID) | ORAL | 0 refills | Status: DC

## 2021-04-21 NOTE — Progress Notes (Signed)
Provided and discussed discharge information. Addressed all questions and concerns. IV removed intact.  Seydou Hearns N Amel Gianino  

## 2021-04-21 NOTE — Plan of Care (Signed)
°  Problem: Education: °Goal: Knowledge of General Education information will improve °Description: Including pain rating scale, medication(s)/side effects and non-pharmacologic comfort measures °Outcome: Progressing °  °Problem: Clinical Measurements: °Goal: Will remain free from infection °Outcome: Progressing °Goal: Respiratory complications will improve °Outcome: Progressing °  °Problem: Pain Managment: °Goal: General experience of comfort will improve °Outcome: Progressing °  °

## 2021-04-21 NOTE — Discharge Summary (Signed)
Patient ID: ORSON RHO MRN: [id number] DOB/AGE: 1941/06/04 80 y.o.  Admit date: 04/20/2021 Discharge date: 04/21/2021  Primary Care Physician:  Lianne Moris, PA-C  Discharge Diagnoses:   Present on Admission:  BPH with urinary obstruction     Discharge Medications: Allergies as of 04/21/2021       Reactions   Sulfa Antibiotics    Weak and dizziness        Medication List     STOP taking these medications    clopidogrel 75 MG tablet Commonly known as: Plavix   finasteride 5 MG tablet Commonly known as: PROSCAR   tamsulosin 0.4 MG Caps capsule Commonly known as: FLOMAX   warfarin 5 MG tablet Commonly known as: COUMADIN       TAKE these medications    acetaminophen 650 MG CR tablet Commonly known as: TYLENOL Take 650 mg by mouth at bedtime.   cephALEXin 500 MG capsule Commonly known as: KEFLEX Take 1 capsule (500 mg total) by mouth every 12 (twelve) hours.   docusate sodium 100 MG capsule Commonly known as: COLACE Take 100 mg by mouth daily.   ESTER C PO Take 500 mg by mouth daily.   hydroxychloroquine 200 MG tablet Commonly known as: PLAQUENIL Take 200 mg by mouth daily.   lisinopril-hydrochlorothiazide 10-12.5 MG tablet Commonly known as: ZESTORETIC Take 1 tablet by mouth daily with breakfast.   multivitamin with minerals Tabs tablet Take 1 tablet by mouth daily. Centrum Silver   Omega-3 350 MG Cpdr Take 350 mg by mouth daily.   omeprazole 20 MG capsule Commonly known as: PRILOSEC Take 20 mg by mouth daily.   predniSONE 5 MG tablet Commonly known as: DELTASONE Take 5 mg by mouth daily with breakfast.   traMADol 50 MG tablet Commonly known as: ULTRAM Take 50 mg by mouth daily as needed for pain.         Significant Diagnostic Studies:  No results found.  Brief H and P: For complete details please refer to admission H and P, but in brief he was admitted for TURP.  Hospital Course:  Active Problems:   BPH with  urinary obstruction   Day of Discharge BP 131/68 (BP Location: Right Arm)   Pulse 60   Temp 97.8 F (36.6 C) (Oral)   Resp 18   Ht 5\' 8"  (1.727 m)   Wt 98.5 kg   SpO2 96%   BMI 33.02 kg/m   Results for orders placed or performed during the hospital encounter of 04/20/21 (from the past 24 hour(s))  Protime-INR     Status: None   Collection Time: 04/20/21  9:10 AM  Result Value Ref Range   Prothrombin Time 13.8 11.4 - 15.2 seconds   INR 1.1 0.8 - 1.2    Physical Exam: General: Alert and awake oriented x3 not in any acute distress. HEENT: anicteric sclera, pupils reactive to light and accommodation CVS: S1-S2 clear no murmur rubs or gallops Chest: clear to auscultation bilaterally, no wheezing rales or rhonchi Abdomen: soft nontender, nondistended, normal bowel sounds, no organomegaly Extremities: no cyanosis, clubbing or edema noted bilaterally Neuro: Cranial nerves II-XII intact, no focal neurological deficits  Disposition:  Home  Diet:  Regular  Activity:  No restrictions   TESTS THAT NEED FOLLOW-UP   Path review  DISCHARGE FOLLOW-UP   Follow-up Information     04/22/21, MD Follow up.   Specialty: Urology Why: We will call to set up appt Contact information: [address] [place] [address] [phone]                 Time spent on Discharge:   10 mins  Signed: Bertram Millard Mirza Kidney 04/21/2021, 7:27 AM

## 2021-04-25 ENCOUNTER — Ambulatory Visit (INDEPENDENT_AMBULATORY_CARE_PROVIDER_SITE_OTHER): Payer: Medicare HMO

## 2021-04-25 ENCOUNTER — Other Ambulatory Visit: Payer: Self-pay

## 2021-04-25 DIAGNOSIS — R3914 Feeling of incomplete bladder emptying: Secondary | ICD-10-CM

## 2021-04-25 DIAGNOSIS — N401 Enlarged prostate with lower urinary tract symptoms: Secondary | ICD-10-CM

## 2021-04-25 DIAGNOSIS — I4891 Unspecified atrial fibrillation: Secondary | ICD-10-CM | POA: Diagnosis not present

## 2021-04-25 NOTE — Progress Notes (Signed)
Fill and Pull Catheter Removal  Patient is present today for a catheter removal.  Patient was cleaned and prepped in a sterile fashion of sterile water/ saline was instilled into the bladder when the patient felt the urge to urinate. 35ml of water was then drained from the balloon.  A 22FR foley cath was removed from the bladder no complications were noted .  Patient as then given some time to void on their own.  Patient can void  on their own after some time.  Patient tolerated well.  Performed by: Haliyah Fryman LPN  Follow up/ Additional notes: Keep next scheduled OV

## 2021-04-30 DIAGNOSIS — Z87891 Personal history of nicotine dependence: Secondary | ICD-10-CM | POA: Diagnosis not present

## 2021-04-30 DIAGNOSIS — I70229 Atherosclerosis of native arteries of extremities with rest pain, unspecified extremity: Secondary | ICD-10-CM | POA: Diagnosis not present

## 2021-04-30 DIAGNOSIS — I10 Essential (primary) hypertension: Secondary | ICD-10-CM | POA: Diagnosis not present

## 2021-04-30 DIAGNOSIS — M328 Other forms of systemic lupus erythematosus: Secondary | ICD-10-CM | POA: Diagnosis not present

## 2021-05-01 DIAGNOSIS — I4891 Unspecified atrial fibrillation: Secondary | ICD-10-CM | POA: Diagnosis not present

## 2021-05-02 ENCOUNTER — Ambulatory Visit: Payer: Medicare HMO | Admitting: Urology

## 2021-05-05 ENCOUNTER — Emergency Department (HOSPITAL_COMMUNITY): Payer: Medicare HMO

## 2021-05-05 ENCOUNTER — Encounter (HOSPITAL_COMMUNITY): Payer: Self-pay | Admitting: *Deleted

## 2021-05-05 ENCOUNTER — Other Ambulatory Visit: Payer: Self-pay

## 2021-05-05 ENCOUNTER — Telehealth: Payer: Self-pay

## 2021-05-05 ENCOUNTER — Emergency Department (HOSPITAL_COMMUNITY)
Admission: EM | Admit: 2021-05-05 | Discharge: 2021-05-05 | Disposition: A | Discharge status: Home / Self Care | Payer: Medicare HMO | Attending: Emergency Medicine | Admitting: Emergency Medicine

## 2021-05-05 DIAGNOSIS — Z87891 Personal history of nicotine dependence: Secondary | ICD-10-CM | POA: Insufficient documentation

## 2021-05-05 DIAGNOSIS — I10 Essential (primary) hypertension: Secondary | ICD-10-CM | POA: Diagnosis not present

## 2021-05-05 DIAGNOSIS — M25552 Pain in left hip: Secondary | ICD-10-CM | POA: Insufficient documentation

## 2021-05-05 DIAGNOSIS — Z79899 Other long term (current) drug therapy: Secondary | ICD-10-CM | POA: Diagnosis not present

## 2021-05-05 DIAGNOSIS — Z96641 Presence of right artificial hip joint: Secondary | ICD-10-CM | POA: Diagnosis not present

## 2021-05-05 DIAGNOSIS — Z96653 Presence of artificial knee joint, bilateral: Secondary | ICD-10-CM | POA: Diagnosis not present

## 2021-05-05 NOTE — Telephone Encounter (Signed)
Wife states patient is at hospital for eval.

## 2021-05-05 NOTE — Telephone Encounter (Signed)
Patient's wife returned missed call. At hospital asking for a call back.  Call back:  [phone]  Thanks, Rosey Bath

## 2021-05-05 NOTE — Telephone Encounter (Signed)
Returning call from on-call report. Message left to return call to office.

## 2021-05-05 NOTE — ED Provider Notes (Addendum)
[facility] EMERGENCY DEPARTMENT Provider Note   CSN: [id number] Arrival date & time: 05/05/21  1048     History Chief Complaint  Patient presents with   Hip Pain    Jose Bush is a 80 y.o. male with a history of DVT, PVD, CVA and OA of bilateral hips and knees, presenting with a complaint of left hip pain x3 days.  No history of falls or fractures.  Denies fever, chills, abdominal or back pain.  Says that this pain comes and goes but is worse when he is walking.  Walks with a Rollator at baseline, has noticed this becoming increasingly difficult.  Pain is treated well with his tramadol, but he is concerned for a fracture or dislocation.  Ports he is on Coumadin for history of LLE arterial clot.  States that his orthopedic doctor told him that he needs to have a replacement of this left hip.  Has already had a replacement of his right hip and bilateral knees.  He has been hesitant to get the procedure done however understands that it may be time to follow-up with Ortho about the surgery.      Past Medical History:  Diagnosis Date   Aortic stenosis    Arthritis    Atrial fibrillation (HCC)    BPH (benign prostatic hyperplasia)    Essential hypertension    GERD (gastroesophageal reflux disease)    Heart murmur    History of DVT (deep vein thrombosis)    Positive anticardiolipin   History of hiatal hernia    History of stroke 2010   Lupus (HCC)    PAD (peripheral artery disease) (HCC)    Right below-knee amputation in 2019   Pulmonary embolism (HCC)    hx of 10 years ago   Shingles    Stroke [facility])    Tinnitus    Wears dentures    Wears glasses     Patient Active Problem List   Diagnosis Date Noted   BPH with urinary obstruction 04/20/2021   Pain of left hip joint 11/02/2020   Non-healing wound of lower extremity 03/18/2018   Nonhealing surgical wound 03/12/2018   PAD (peripheral artery disease) (HCC) 03/03/2018   PVD (peripheral vascular disease) (HCC) 03/03/2018    History of total replacement of right hip 10/30/2017   Iron deficiency anemia    Atrophic gastritis without hemorrhage    OA (osteoarthritis) of hip 05/01/2017   OA (osteoarthritis) of knee 12/13/2014   Other pancytopenia (HCC) 02/04/2013   Fever 02/02/2013   Neutropenia (HCC) 02/02/2013   Weakness 02/01/2013   Rigors 02/01/2013   Headache(784.0) 02/01/2013   Lupus (HCC) 02/01/2013   Thrombocytopenia (HCC) 02/01/2013   DVT (deep venous thrombosis) (HCC)    HYPERTENSION, UNSPECIFIED 06/22/2009   CHEST PAIN-UNSPECIFIED 06/22/2009    Past Surgical History:  Procedure Laterality Date   ABDOMINAL AORTOGRAM W/LOWER EXTREMITY N/A 02/17/2018   Procedure: ABDOMINAL AORTOGRAM W/LOWER EXTREMITY;  Surgeon: Maeola Harman, MD;  Location: [facility] INVASIVE CV LAB;  Service: Cardiovascular;  Laterality: N/A;   ABDOMINAL AORTOGRAM W/LOWER EXTREMITY Left 12/30/2020   Procedure: ABDOMINAL AORTOGRAM W/LOWER EXTREMITY;  Surgeon: Sherren Kerns, MD;  Location: [facility] INVASIVE CV LAB;  Service: Cardiovascular;  Laterality: Left;   AMPUTATION Right 03/03/2018   Procedure: AMPUTATION RIGHT SECOND TOE;  Surgeon: Sherren Kerns, MD;  Location: [facility] OR;  Service: Vascular;  Laterality: Right;   AMPUTATION Right 03/18/2018   Procedure: AMPUTATION BELOW KNEE;  Surgeon: Sherren Kerns, MD;  Location:  [facility] OR;  Service: Vascular;  Laterality: Right;   BELOW KNEE LEG AMPUTATION Right 03/18/2018   bilateral cataract surgery      BIOPSY N/A 10/22/2017   Procedure: BIOPSY;  Surgeon: Franky MachoJenkins, Mark, MD;  Location: [facility] ENDO SUITE;  Service: Gastroenterology;  Laterality: N/A;   CATARACT EXTRACTION Bilateral 09/20/2016   COLONOSCOPY N/A 09/18/2016   Dr. Lovell SheehanJenkins: normal   ESOPHAGOGASTRODUODENOSCOPY N/A 10/22/2017   Dr. Lovell SheehanJenkins: normal duodenum, gastric mucosal atrophy but negative H.pylori (Clotest negative), normal GE junction   GIVENS CAPSULE STUDY N/A 01/27/2018   Procedure: GIVENS CAPSULE STUDY;  Surgeon: Corbin Adeourk,  Robert M, MD;  Location: [facility] ENDO SUITE;  Service: Endoscopy;  Laterality: N/A;  7:00am   HERNIA REPAIR     2010/laparoscopic/right   IVC FILTER INSERTION     01/2013    KNEE ARTHROSCOPY Right    2007   MULTIPLE TOOTH EXTRACTIONS     PERIPHERAL VASCULAR BALLOON ANGIOPLASTY  02/17/2018   Procedure: PERIPHERAL VASCULAR BALLOON ANGIOPLASTY;  Surgeon: Maeola Harmanain, Brandon Christopher, MD;  Location: [facility] INVASIVE CV LAB;  Service: Cardiovascular;;   PERIPHERAL VASCULAR BALLOON ANGIOPLASTY Left 12/30/2020   Procedure: PERIPHERAL VASCULAR BALLOON ANGIOPLASTY;  Surgeon: Sherren KernsFields, Charles E, MD;  Location: [facility] INVASIVE CV LAB;  Service: Cardiovascular;  Laterality: Left;  PT   TOTAL HIP ARTHROPLASTY Right 05/01/2017   Procedure: RIGHT TOTAL HIP ARTHROPLASTY ANTERIOR APPROACH;  Surgeon: Ollen GrossAluisio, Frank, MD;  Location: [facility] ORS;  Service: Orthopedics;  Laterality: Right;   TOTAL KNEE ARTHROPLASTY Left 12/13/2014   Procedure: LEFT TOTAL KNEE ARTHROPLASTY;  Surgeon: Ollen GrossFrank Aluisio, MD;  Location: [facility] ORS;  Service: Orthopedics;  Laterality: Left;   TOTAL KNEE ARTHROPLASTY Right 03/21/2015   Procedure: RIGHT TOTAL KNEE ARTHROPLASTY;  Surgeon: Ollen GrossFrank Aluisio, MD;  Location: [facility] ORS;  Service: Orthopedics;  Laterality: Right;   TRANSURETHRAL RESECTION OF PROSTATE N/A 04/20/2021   Procedure: TRANSURETHRAL RESECTION OF THE PROSTATE (TURP);  Surgeon: Marcine Matarahlstedt, Stephen, MD;  Location: [facility] ORS;  Service: Urology;  Laterality: N/A;  90 MINS       Family History  Problem Relation Age of Onset   Hypertension Other    Stroke Mother    Stroke Father    Heart failure Sister    CAD Brother        CABG age 81   Colon cancer Neg Hx     Social History   Tobacco Use   Smoking status: Former    Packs/day: 0.50    Years: 40.00    Pack years: 20.00    Types: Cigarettes    Quit date: 10/02/1983    Years since quitting: 37.6   Smokeless tobacco: Never  Vaping Use   Vaping Use: Never used  Substance Use Topics   Alcohol use: No    Drug use: No    Home Medications Prior to Admission medications   Medication Sig Start Date End Date Taking? Authorizing Provider  acetaminophen (TYLENOL) 650 MG CR tablet Take 650 mg by mouth at bedtime.    [provider]  Bioflavonoid Products (ESTER C PO) Take 500 mg by mouth daily.    [provider]  cephALEXin (KEFLEX) 500 MG capsule Take 1 capsule (500 mg total) by mouth every 12 (twelve) hours. 04/21/21   Marcine Matarahlstedt, Stephen, MD  docusate sodium (COLACE) 100 MG capsule Take 100 mg by mouth daily.    [provider]  hydroxychloroquine (PLAQUENIL) 200 MG tablet Take 200 mg by mouth daily.    [provider]  lisinopril-hydrochlorothiazide Marcell Anger(PRINZIDE,ZESTORETIC)  10-12.5 MG per tablet Take 1 tablet by mouth daily with breakfast.     [provider]  Multiple Vitamin (MULTIVITAMIN WITH MINERALS) TABS tablet Take 1 tablet by mouth daily. Centrum Silver    [provider]  Omega-3 350 MG CPDR Take 350 mg by mouth daily.    [provider]  omeprazole (PRILOSEC) 20 MG capsule Take 20 mg by mouth daily. 11/06/17   [provider]  predniSONE (DELTASONE) 5 MG tablet Take 5 mg by mouth daily with breakfast. 09/28/19   [provider]  traMADol (ULTRAM) 50 MG tablet Take 50 mg by mouth daily as needed for pain. 12/29/20   [provider]    Allergies    Sulfa antibiotics  Review of Systems   Review of Systems  Constitutional:  Negative for chills, fatigue and fever.  Musculoskeletal:  Positive for arthralgias and gait problem. Negative for back pain, joint swelling and myalgias.  Skin:  Negative for color change and wound.  Neurological:  Negative for dizziness, weakness, light-headedness, numbness and headaches.   Physical Exam Updated Vital Signs BP (!) 151/87 (BP Location: Right Arm)   Pulse 69   Temp 98.2 F (36.8 C) (Oral)   Resp 18   SpO2 98%   Physical Exam Vitals and nursing note reviewed.   Constitutional:      Appearance: Normal appearance.  HENT:     Head: Normocephalic and atraumatic.  Eyes:     General: No scleral icterus.    Conjunctiva/sclera: Conjunctivae normal.  Pulmonary:     Effort: Pulmonary effort is normal. No respiratory distress.  Abdominal:     Tenderness: There is no right CVA tenderness or left CVA tenderness.  Musculoskeletal:        General: Tenderness present. No swelling, deformity or signs of injury. Normal range of motion.     Comments: Mild tenderness to the ASIS.  Full active and passive range of motion intact.  Reports mild pain with internal rotation and adduction.  5 out of 5 strength of lower extremity bilaterally. Negative straight leg raise on left side.   Skin:    General: Skin is warm and dry.     Capillary Refill: Capillary refill takes less than 2 seconds.     Findings: No bruising.  Neurological:     Mental Status: He is alert.     Sensory: No sensory deficit.     Motor: No weakness.  Psychiatric:        Mood and Affect: Mood normal.    ED Results / Procedures / Treatments   Labs (all labs ordered are listed, but only abnormal results are displayed) Labs Reviewed - No data to display  EKG None  Radiology DG Hip Unilat With Pelvis 2-3 Views Left  Result Date: 05/05/2021 CLINICAL DATA:  Left hip pain EXAM: DG HIP (WITH OR WITHOUT PELVIS) 2-3V LEFT COMPARISON:  X-ray dated January 23, 2018 FINDINGS: No evidence of acute fracture or dislocation. Moderate degenerative changes of the left hip joint consisting of joint space loss and subchondral sclerosis. Single AP view of the right hip demonstrates total right hip arthroplasty. Visualized sacrum is in tact. Mild degenerative changes of the bilateral sacroiliac joints and pubic symphysis. Vascular calcifications. IMPRESSION: Moderate degenerative changes of the left hip. Electronically Signed   By: Allegra Lai MD   On: 05/05/2021 12:48     Medications Ordered in ED Medications  - No data to display  ED Course  I have reviewed  the triage vital signs and the nursing notes.  Pertinent labs & imaging results that were available during my care of the patient were reviewed by me and considered in my medical decision making (see chart for details).  I personally reviewed, discussed & displayed XR photos to the patient.  He is relieved that there is no fracture or dislocation.  Agreeable to follow-up with orthopedist.  Need for analgesia, reports good pain control with his tramadol.   MDM Rules/Calculators/A&P  Patient was evaluated and found to be at low risk for emergent causes of hip pain.  Denies history of diabetes, IVDU, trauma, fever chills or other systemic features.  Is neurovascularly intact distally.  With patient history of OA, and a previous orthopedic recommendation of left hip replacement, I believe his pain to be a result of further degenerative changes.  Final Clinical Impression(s) / ED Diagnoses Final diagnoses:  Left hip pain    Rx / DC Orders Results and diagnoses were explained to the patient. Return precautions discussed in full. Patient had no additional questions and expressed complete understanding.     Saddie Benders, PA-C 05/05/21 1411    Woodroe Chen 05/05/21 1424    Eber Hong, MD 05/06/21 [phone]

## 2021-05-05 NOTE — ED Triage Notes (Signed)
Left hip pain x4days.

## 2021-05-08 DIAGNOSIS — I4891 Unspecified atrial fibrillation: Secondary | ICD-10-CM | POA: Diagnosis not present

## 2021-05-09 ENCOUNTER — Other Ambulatory Visit: Payer: Self-pay

## 2021-05-09 ENCOUNTER — Encounter: Payer: Self-pay | Admitting: Urology

## 2021-05-09 ENCOUNTER — Ambulatory Visit (INDEPENDENT_AMBULATORY_CARE_PROVIDER_SITE_OTHER): Payer: Medicare HMO | Admitting: Urology

## 2021-05-09 VITALS — BP 166/73 | HR 79 | Ht 68.0 in | Wt 214.0 lb

## 2021-05-09 DIAGNOSIS — R3914 Feeling of incomplete bladder emptying: Secondary | ICD-10-CM

## 2021-05-09 DIAGNOSIS — R339 Retention of urine, unspecified: Secondary | ICD-10-CM

## 2021-05-09 DIAGNOSIS — N401 Enlarged prostate with lower urinary tract symptoms: Secondary | ICD-10-CM

## 2021-05-09 LAB — MICROSCOPIC EXAMINATION: Renal Epithel, UA: NONE SEEN /hpf

## 2021-05-09 LAB — URINALYSIS, ROUTINE W REFLEX MICROSCOPIC
Bilirubin, UA: NEGATIVE
Glucose, UA: NEGATIVE
Ketones, UA: NEGATIVE
Nitrite, UA: NEGATIVE
Specific Gravity, UA: 1.015 (ref 1.005–1.030)
Urobilinogen, Ur: 0.2 mg/dL (ref 0.2–1.0)
pH, UA: 7.5 (ref 5.0–7.5)

## 2021-05-09 NOTE — Progress Notes (Signed)
Urological Symptom Review  Patient is experiencing the following symptoms: Frequent urination Hard to postpone urination Burning/pain with urination Get up at night to urinate Leakage of urine Stream starts and stops Trouble starting stream   Review of Systems  Gastrointestinal (upper)  : Indigestion/heartburn  Gastrointestinal (lower) : Negative for lower GI symptoms  Constitutional : Negative for symptoms  Skin: Negative for skin symptoms  Eyes: Negative for eye symptoms  Ear/Nose/Throat : Negative for Ear/Nose/Throat symptoms  Hematologic/Lymphatic: Easy bruising  Cardiovascular : Leg swelling  Respiratory : Negative for respiratory symptoms  Endocrine: Negative for endocrine symptoms  Musculoskeletal: Back pain Joint pain  Neurological: Negative for neurological symptoms  Psychologic: Negative for psychiatric symptoms

## 2021-05-09 NOTE — Progress Notes (Signed)
History of Present Illness: Here for followup of BPH after TURP.  Procedure done on 7/2.  Pathology revealed all benign disease.  15 g of tissue resected.  Since that time, he has had urgency, urge incontinence.  Better stream.  Feels like he is emptying out well.  Incontinence is about the same as it was before the procedure.    Past Medical History:  Diagnosis Date   Aortic stenosis    Arthritis    Atrial fibrillation (HCC)    BPH (benign prostatic hyperplasia)    Essential hypertension    GERD (gastroesophageal reflux disease)    Heart murmur    History of DVT (deep vein thrombosis)    Positive anticardiolipin   History of hiatal hernia    History of stroke 2010   Lupus (HCC)    PAD (peripheral artery disease) (HCC)    Right below-knee amputation in 2019   Pulmonary embolism (HCC)    hx of 10 years ago   Shingles    Stroke [facility])    Tinnitus    Wears dentures    Wears glasses     Past Surgical History:  Procedure Laterality Date   ABDOMINAL AORTOGRAM W/LOWER EXTREMITY N/A 02/17/2018   Procedure: ABDOMINAL AORTOGRAM W/LOWER EXTREMITY;  Surgeon: Maeola Harman, MD;  Location: [facility] INVASIVE CV LAB;  Service: Cardiovascular;  Laterality: N/A;   ABDOMINAL AORTOGRAM W/LOWER EXTREMITY Left 12/30/2020   Procedure: ABDOMINAL AORTOGRAM W/LOWER EXTREMITY;  Surgeon: Sherren Kerns, MD;  Location: [facility] INVASIVE CV LAB;  Service: Cardiovascular;  Laterality: Left;   AMPUTATION Right 03/03/2018   Procedure: AMPUTATION RIGHT SECOND TOE;  Surgeon: Sherren Kerns, MD;  Location: [facility] OR;  Service: Vascular;  Laterality: Right;   AMPUTATION Right 03/18/2018   Procedure: AMPUTATION BELOW KNEE;  Surgeon: Sherren Kerns, MD;  Location: [facility] OR;  Service: Vascular;  Laterality: Right;   BELOW KNEE LEG AMPUTATION Right 03/18/2018   bilateral cataract surgery      BIOPSY N/A 10/22/2017   Procedure: BIOPSY;  Surgeon: Franky Macho, MD;  Location: [facility] ENDO SUITE;  Service: Gastroenterology;   Laterality: N/A;   CATARACT EXTRACTION Bilateral 09/20/2016   COLONOSCOPY N/A 09/18/2016   Dr. Lovell Sheehan: normal   ESOPHAGOGASTRODUODENOSCOPY N/A 10/22/2017   Dr. Lovell Sheehan: normal duodenum, gastric mucosal atrophy but negative H.pylori (Clotest negative), normal GE junction   GIVENS CAPSULE STUDY N/A 01/27/2018   Procedure: GIVENS CAPSULE STUDY;  Surgeon: Corbin Ade, MD;  Location: [facility] ENDO SUITE;  Service: Endoscopy;  Laterality: N/A;  7:00am   HERNIA REPAIR     2010/laparoscopic/right   IVC FILTER INSERTION     01/2013    KNEE ARTHROSCOPY Right    2007   MULTIPLE TOOTH EXTRACTIONS     PERIPHERAL VASCULAR BALLOON ANGIOPLASTY  02/17/2018   Procedure: PERIPHERAL VASCULAR BALLOON ANGIOPLASTY;  Surgeon: Maeola Harman, MD;  Location: [facility] INVASIVE CV LAB;  Service: Cardiovascular;;   PERIPHERAL VASCULAR BALLOON ANGIOPLASTY Left 12/30/2020   Procedure: PERIPHERAL VASCULAR BALLOON ANGIOPLASTY;  Surgeon: Sherren Kerns, MD;  Location: [facility] INVASIVE CV LAB;  Service: Cardiovascular;  Laterality: Left;  PT   TOTAL HIP ARTHROPLASTY Right 05/01/2017   Procedure: RIGHT TOTAL HIP ARTHROPLASTY ANTERIOR APPROACH;  Surgeon: Ollen Gross, MD;  Location: [facility] ORS;  Service: Orthopedics;  Laterality: Right;   TOTAL KNEE ARTHROPLASTY Left 12/13/2014   Procedure: LEFT TOTAL KNEE ARTHROPLASTY;  Surgeon: Ollen Gross, MD;  Location: [facility] ORS;  Service: Orthopedics;  Laterality: Left;   TOTAL KNEE ARTHROPLASTY  Right 03/21/2015   Procedure: RIGHT TOTAL KNEE ARTHROPLASTY;  Surgeon: Ollen Gross, MD;  Location: [facility] ORS;  Service: Orthopedics;  Laterality: Right;   TRANSURETHRAL RESECTION OF PROSTATE N/A 04/20/2021   Procedure: TRANSURETHRAL RESECTION OF THE PROSTATE (TURP);  Surgeon: Marcine Matar, MD;  Location: [facility] ORS;  Service: Urology;  Laterality: N/A;  90 MINS    Home Medications:  Allergies as of 05/09/2021       Reactions   Sulfa Antibiotics    Weak and dizziness        Medication List         Accurate as of May 09, 2021  8:24 AM. If you have any questions, ask your nurse or doctor.          acetaminophen 650 MG CR tablet Commonly known as: TYLENOL Take 650 mg by mouth at bedtime.   cephALEXin 500 MG capsule Commonly known as: KEFLEX Take 1 capsule (500 mg total) by mouth every 12 (twelve) hours.   docusate sodium 100 MG capsule Commonly known as: COLACE Take 100 mg by mouth daily.   ESTER C PO Take 500 mg by mouth daily.   hydroxychloroquine 200 MG tablet Commonly known as: PLAQUENIL Take 200 mg by mouth daily.   lisinopril-hydrochlorothiazide 10-12.5 MG tablet Commonly known as: ZESTORETIC Take 1 tablet by mouth daily with breakfast.   multivitamin with minerals Tabs tablet Take 1 tablet by mouth daily. Centrum Silver   Omega-3 350 MG Cpdr Take 350 mg by mouth daily.   omeprazole 20 MG capsule Commonly known as: PRILOSEC Take 20 mg by mouth daily.   predniSONE 5 MG tablet Commonly known as: DELTASONE Take 5 mg by mouth daily with breakfast.   traMADol 50 MG tablet Commonly known as: ULTRAM Take 50 mg by mouth daily as needed for pain.        Allergies:  Allergies  Allergen Reactions   Sulfa Antibiotics     Weak and dizziness    Family History  Problem Relation Age of Onset   Hypertension Other    Stroke Mother    Stroke Father    Heart failure Sister    CAD Brother        CABG age 27   Colon cancer Neg Hx     Social History:  reports that he quit smoking about 37 years ago. His smoking use included cigarettes. He has a 20.00 pack-year smoking history. He has never used smokeless tobacco. He reports that he does not drink alcohol and does not use drugs.  ROS: A complete review of systems was performed.  All systems are negative except for pertinent findings as noted.  Physical Exam:  Vital signs in last 24 hours: There were no vitals taken for this visit. Constitutional:  Alert and oriented, No acute  distress Cardiovascular: Regular rate  Respiratory: Normal respiratory effort GI: Abdomen is soft, nontender, nondistended, no abdominal masses. No CVAT.  Genitourinary: Normal male phallus, testes are descended bilaterally and non-tender and without masses, scrotum is normal in appearance without lesions or masses, perineum is normal on inspection. Lymphatic: No lymphadenopathy Neurologic: Grossly intact, no focal deficits Psychiatric: Normal mood and affect  I have reviewed prior pt notes  I have reviewed notes from referring/previous physicians  I have reviewed urinalysis results  I have independently reviewed prior imaging  I have reviewed prior pathology  Bladder scan today revealed residual urine volume of 120 mL.   Impression/Assessment:  BPH with urgency, urgency incontinence, bothersome.  He  empties out fairly well for him  Plan:  1.  I will give him a trial of Myrbetriq 50 mg daily for 6 weeks  2.  Side effect profile discussed, he will keep a check on his blood pressure  3.  Office visit in 6 weeks to recheck

## 2021-05-16 DIAGNOSIS — I4891 Unspecified atrial fibrillation: Secondary | ICD-10-CM | POA: Diagnosis not present

## 2021-05-21 DIAGNOSIS — R339 Retention of urine, unspecified: Secondary | ICD-10-CM | POA: Diagnosis not present

## 2021-05-21 DIAGNOSIS — I4891 Unspecified atrial fibrillation: Secondary | ICD-10-CM | POA: Diagnosis not present

## 2021-05-21 DIAGNOSIS — R531 Weakness: Secondary | ICD-10-CM | POA: Diagnosis not present

## 2021-05-24 DIAGNOSIS — I4891 Unspecified atrial fibrillation: Secondary | ICD-10-CM | POA: Diagnosis not present

## 2021-05-31 DIAGNOSIS — I10 Essential (primary) hypertension: Secondary | ICD-10-CM | POA: Diagnosis not present

## 2021-05-31 DIAGNOSIS — M329 Systemic lupus erythematosus, unspecified: Secondary | ICD-10-CM | POA: Diagnosis not present

## 2021-05-31 DIAGNOSIS — I4891 Unspecified atrial fibrillation: Secondary | ICD-10-CM | POA: Diagnosis not present

## 2021-06-07 DIAGNOSIS — I4891 Unspecified atrial fibrillation: Secondary | ICD-10-CM | POA: Diagnosis not present

## 2021-06-07 DIAGNOSIS — I639 Cerebral infarction, unspecified: Secondary | ICD-10-CM | POA: Diagnosis not present

## 2021-06-21 DIAGNOSIS — I4891 Unspecified atrial fibrillation: Secondary | ICD-10-CM | POA: Diagnosis not present

## 2021-06-27 ENCOUNTER — Encounter: Payer: Self-pay | Admitting: Urology

## 2021-06-27 ENCOUNTER — Ambulatory Visit (INDEPENDENT_AMBULATORY_CARE_PROVIDER_SITE_OTHER): Payer: Medicare HMO | Admitting: Urology

## 2021-06-27 ENCOUNTER — Other Ambulatory Visit: Payer: Self-pay

## 2021-06-27 VITALS — BP 140/74 | HR 72 | Temp 97.8°F | Wt 212.0 lb

## 2021-06-27 DIAGNOSIS — R3915 Urgency of urination: Secondary | ICD-10-CM | POA: Diagnosis not present

## 2021-06-27 DIAGNOSIS — R339 Retention of urine, unspecified: Secondary | ICD-10-CM

## 2021-06-27 LAB — URINALYSIS, ROUTINE W REFLEX MICROSCOPIC
Bilirubin, UA: NEGATIVE
Glucose, UA: NEGATIVE
Ketones, UA: NEGATIVE
Nitrite, UA: NEGATIVE
Protein,UA: NEGATIVE
Specific Gravity, UA: 1.015 (ref 1.005–1.030)
Urobilinogen, Ur: 0.2 mg/dL (ref 0.2–1.0)
pH, UA: 7.5 (ref 5.0–7.5)

## 2021-06-27 LAB — MICROSCOPIC EXAMINATION: Renal Epithel, UA: NONE SEEN /hpf

## 2021-06-27 MED ORDER — SOLIFENACIN SUCCINATE 10 MG PO TABS: 10.0000 mg | ORAL_TABLET | Freq: Every day | ORAL | 3 refills | Status: DC

## 2021-06-27 NOTE — Progress Notes (Signed)

## 2021-06-27 NOTE — Progress Notes (Signed)
History of Present Illness: Here for followup of BPH after TURP.   Procedure done on 7/2.  Pathology revealed all benign disease.  15 g of tissue resected.  At his visit here 6 weeks ago he was having urgency, frequency and urgency incontinence.  Otherwise he was doing well.  He was given samples of Myrbetriq 50 mg.  Follow-up today with the patient and his wife.  He has had significant improvement in his lower urinary tract symptoms and leakage since starting Myrbetriq.  He is hampered somewhat by a right BKA which limits his mobility.  He has had no side effects from the medication.  Past Medical History:  Diagnosis Date   Aortic stenosis    Arthritis    Atrial fibrillation (HCC)    BPH (benign prostatic hyperplasia)    Essential hypertension    GERD (gastroesophageal reflux disease)    Heart murmur    History of DVT (deep vein thrombosis)    Positive anticardiolipin   History of hiatal hernia    History of stroke 2010   Lupus (HCC)    PAD (peripheral artery disease) (HCC)    Right below-knee amputation in 2019   Pulmonary embolism (HCC)    hx of 10 years ago   Shingles    Stroke [facility])    Tinnitus    Wears dentures    Wears glasses     Past Surgical History:  Procedure Laterality Date   ABDOMINAL AORTOGRAM W/LOWER EXTREMITY N/A 02/17/2018   Procedure: ABDOMINAL AORTOGRAM W/LOWER EXTREMITY;  Surgeon: Maeola Harman, MD;  Location: [facility] INVASIVE CV LAB;  Service: Cardiovascular;  Laterality: N/A;   ABDOMINAL AORTOGRAM W/LOWER EXTREMITY Left 12/30/2020   Procedure: ABDOMINAL AORTOGRAM W/LOWER EXTREMITY;  Surgeon: Sherren Kerns, MD;  Location: [facility] INVASIVE CV LAB;  Service: Cardiovascular;  Laterality: Left;   AMPUTATION Right 03/03/2018   Procedure: AMPUTATION RIGHT SECOND TOE;  Surgeon: Sherren Kerns, MD;  Location: [facility] OR;  Service: Vascular;  Laterality: Right;   AMPUTATION Right 03/18/2018   Procedure: AMPUTATION BELOW KNEE;  Surgeon: Sherren Kerns, MD;   Location: [facility] OR;  Service: Vascular;  Laterality: Right;   BELOW KNEE LEG AMPUTATION Right 03/18/2018   bilateral cataract surgery      BIOPSY N/A 10/22/2017   Procedure: BIOPSY;  Surgeon: Franky Macho, MD;  Location: [facility];  Service: Gastroenterology;  Laterality: N/A;   CATARACT EXTRACTION Bilateral 09/20/2016   COLONOSCOPY N/A 09/18/2016   Dr. Lovell Sheehan: normal   ESOPHAGOGASTRODUODENOSCOPY N/A 10/22/2017   Dr. Lovell Sheehan: normal duodenum, gastric mucosal atrophy but negative H.pylori (Clotest negative), normal GE junction   GIVENS CAPSULE STUDY N/A 01/27/2018   Procedure: GIVENS CAPSULE STUDY;  Surgeon: Corbin Ade, MD;  Location: [facility];  Service: Endoscopy;  Laterality: N/A;  7:00am   HERNIA REPAIR     2010/laparoscopic/right   IVC FILTER INSERTION     01/2013    KNEE ARTHROSCOPY Right    2007   MULTIPLE TOOTH EXTRACTIONS     PERIPHERAL VASCULAR BALLOON ANGIOPLASTY  02/17/2018   Procedure: PERIPHERAL VASCULAR BALLOON ANGIOPLASTY;  Surgeon: Maeola Harman, MD;  Location: [facility] INVASIVE CV LAB;  Service: Cardiovascular;;   PERIPHERAL VASCULAR BALLOON ANGIOPLASTY Left 12/30/2020   Procedure: PERIPHERAL VASCULAR BALLOON ANGIOPLASTY;  Surgeon: Sherren Kerns, MD;  Location: [facility] INVASIVE CV LAB;  Service: Cardiovascular;  Laterality: Left;  PT   TOTAL HIP ARTHROPLASTY Right 05/01/2017   Procedure: RIGHT TOTAL HIP ARTHROPLASTY ANTERIOR APPROACH;  Surgeon: Lequita Halt,  Homero Fellers, MD;  Location: [facility] ORS;  Service: Orthopedics;  Laterality: Right;   TOTAL KNEE ARTHROPLASTY Left 12/13/2014   Procedure: LEFT TOTAL KNEE ARTHROPLASTY;  Surgeon: Ollen Gross, MD;  Location: [facility] ORS;  Service: Orthopedics;  Laterality: Left;   TOTAL KNEE ARTHROPLASTY Right 03/21/2015   Procedure: RIGHT TOTAL KNEE ARTHROPLASTY;  Surgeon: Ollen Gross, MD;  Location: [facility] ORS;  Service: Orthopedics;  Laterality: Right;   TRANSURETHRAL RESECTION OF PROSTATE N/A 04/20/2021   Procedure: TRANSURETHRAL RESECTION OF  THE PROSTATE (TURP);  Surgeon: Marcine Matar, MD;  Location: [facility] ORS;  Service: Urology;  Laterality: N/A;  90 MINS    Home Medications:  Allergies as of 06/27/2021       Reactions   Sulfa Antibiotics    Weak and dizziness        Medication List        Accurate as of June 27, 2021 10:45 AM. If you have any questions, ask your nurse or doctor.          acetaminophen 650 MG CR tablet Commonly known as: TYLENOL Take 650 mg by mouth at bedtime.   cephALEXin 500 MG capsule Commonly known as: KEFLEX Take 1 capsule (500 mg total) by mouth every 12 (twelve) hours.   docusate sodium 100 MG capsule Commonly known as: COLACE Take 100 mg by mouth daily.   ESTER C PO Take 500 mg by mouth daily.   hydroxychloroquine 200 MG tablet Commonly known as: PLAQUENIL Take 200 mg by mouth daily.   lisinopril-hydrochlorothiazide 10-12.5 MG tablet Commonly known as: ZESTORETIC Take 1 tablet by mouth daily with breakfast.   multivitamin with minerals Tabs tablet Take 1 tablet by mouth daily. Centrum Silver   Omega-3 350 MG Cpdr Take 350 mg by mouth daily.   omeprazole 20 MG capsule Commonly known as: PRILOSEC Take 20 mg by mouth daily.   predniSONE 5 MG tablet Commonly known as: DELTASONE Take 5 mg by mouth daily with breakfast.   traMADol 50 MG tablet Commonly known as: ULTRAM Take 50 mg by mouth daily as needed for pain.        Allergies:  Allergies  Allergen Reactions   Sulfa Antibiotics     Weak and dizziness    Family History  Problem Relation Age of Onset   Hypertension Other    Stroke Mother    Stroke Father    Heart failure Sister    CAD Brother        CABG age 50   Colon cancer Neg Hx     Social History:  reports that he quit smoking about 37 years ago. His smoking use included cigarettes. He has a 20.00 pack-year smoking history. He has never used smokeless tobacco. He reports that he does not drink alcohol and does not use  drugs.  ROS: A complete review of systems was performed.  All systems are negative except for pertinent findings as noted.  Physical Exam:  Vital signs in last 24 hours: There were no vitals taken for this visit. Constitutional:  Alert and oriented, No acute distress Cardiovascular: Regular rate  Respiratory: Normal respiratory effort   I have reviewed prior pt note  I have reviewed urinalysis results    Impression/Assessment:  BPH with lower urinary tract symptoms, somewhat exacerbated with removing obstruction.  His lower urinary tract symptoms/frequency/urgency are improved with Myrbetriq  Plan:  1.  I will send a prescription in for Solifenacin  2.  I will see back in about 3 months for  recheck  3.  Overactive bladder guide sheet given

## 2021-06-30 DIAGNOSIS — I4891 Unspecified atrial fibrillation: Secondary | ICD-10-CM | POA: Diagnosis not present

## 2021-06-30 DIAGNOSIS — R531 Weakness: Secondary | ICD-10-CM | POA: Diagnosis not present

## 2021-06-30 DIAGNOSIS — D6859 Other primary thrombophilia: Secondary | ICD-10-CM | POA: Diagnosis not present

## 2021-06-30 DIAGNOSIS — R5383 Other fatigue: Secondary | ICD-10-CM | POA: Diagnosis not present

## 2021-06-30 DIAGNOSIS — I10 Essential (primary) hypertension: Secondary | ICD-10-CM | POA: Diagnosis not present

## 2021-06-30 DIAGNOSIS — M25511 Pain in right shoulder: Secondary | ICD-10-CM | POA: Diagnosis not present

## 2021-06-30 DIAGNOSIS — K219 Gastro-esophageal reflux disease without esophagitis: Secondary | ICD-10-CM | POA: Diagnosis not present

## 2021-06-30 DIAGNOSIS — D649 Anemia, unspecified: Secondary | ICD-10-CM | POA: Diagnosis not present

## 2021-06-30 DIAGNOSIS — N400 Enlarged prostate without lower urinary tract symptoms: Secondary | ICD-10-CM | POA: Diagnosis not present

## 2021-06-30 DIAGNOSIS — M329 Systemic lupus erythematosus, unspecified: Secondary | ICD-10-CM | POA: Diagnosis not present

## 2021-07-06 DIAGNOSIS — E669 Obesity, unspecified: Secondary | ICD-10-CM | POA: Diagnosis not present

## 2021-07-06 DIAGNOSIS — Z79899 Other long term (current) drug therapy: Secondary | ICD-10-CM | POA: Diagnosis not present

## 2021-07-06 DIAGNOSIS — R5382 Chronic fatigue, unspecified: Secondary | ICD-10-CM | POA: Diagnosis not present

## 2021-07-06 DIAGNOSIS — D6861 Antiphospholipid syndrome: Secondary | ICD-10-CM | POA: Diagnosis not present

## 2021-07-06 DIAGNOSIS — Z6831 Body mass index (BMI) 31.0-31.9, adult: Secondary | ICD-10-CM | POA: Diagnosis not present

## 2021-07-06 DIAGNOSIS — M150 Primary generalized (osteo)arthritis: Secondary | ICD-10-CM | POA: Diagnosis not present

## 2021-07-06 DIAGNOSIS — M329 Systemic lupus erythematosus, unspecified: Secondary | ICD-10-CM | POA: Diagnosis not present

## 2021-07-06 DIAGNOSIS — E291 Testicular hypofunction: Secondary | ICD-10-CM | POA: Diagnosis not present

## 2021-07-06 DIAGNOSIS — M2550 Pain in unspecified joint: Secondary | ICD-10-CM | POA: Diagnosis not present

## 2021-07-12 ENCOUNTER — Ambulatory Visit (HOSPITAL_COMMUNITY)
Admission: RE | Admit: 2021-07-12 | Discharge: 2021-07-12 | Disposition: A | Discharge status: Home / Self Care | Payer: Medicare HMO | Source: Ambulatory Visit | Attending: Vascular Surgery | Admitting: Vascular Surgery

## 2021-07-12 ENCOUNTER — Ambulatory Visit: Payer: Medicare HMO | Admitting: Physician Assistant

## 2021-07-12 ENCOUNTER — Ambulatory Visit (INDEPENDENT_AMBULATORY_CARE_PROVIDER_SITE_OTHER)
Admission: RE | Admit: 2021-07-12 | Discharge: 2021-07-12 | Disposition: A | Discharge status: Home / Self Care | Payer: Medicare HMO | Source: Ambulatory Visit | Attending: Vascular Surgery | Admitting: Vascular Surgery

## 2021-07-12 ENCOUNTER — Other Ambulatory Visit: Payer: Self-pay

## 2021-07-12 VITALS — BP 148/78 | HR 69 | Temp 98.1°F | Resp 16 | Ht 68.0 in | Wt 209.4 lb

## 2021-07-12 DIAGNOSIS — I739 Peripheral vascular disease, unspecified: Secondary | ICD-10-CM

## 2021-07-12 DIAGNOSIS — Z89511 Acquired absence of right leg below knee: Secondary | ICD-10-CM

## 2021-07-12 NOTE — Progress Notes (Signed)
HISTORY AND PHYSICAL     CC:  follow up. Requesting Provider:  Lianne Moris, PA-C  HPI: This is a 80 y.o. male who is here today for follow up for PAD.  He has hx of angioplasty of his left posterior tibial artery on 12/30/20 for a left 3rd toe ulceration on 12/30/2020 by Dr. Darrick Penna.  Pt was last seen July 2022 and at that time, his ulcer had healed.  He did have a right femoral psa and was scheduled for a thrombin injection, however, it was thrombosed on duplex and procedure was cancelled.  He has hx of right BKA (June 2019) and was ambulating some with his prosthesis and was not having any claudication sx.  He did have muscle cramps that improved with mustard.   The pt returns today for follow up and accompanied by his wife.  He states he is doing well.   He is walking without difficulty with his prosthesis.  He does have some claudication of the left calf that is relieved with rest.  He does not have any rest pain and his ulcer continues to be healed without any new ulcers.  He had a TURP in July 2022.  He was taken off of his Plavix and has not restarted it.  He does not take an asa.  He is on coumadin.  He quit smoking in 1985.   Pt does not have family hx of AAA.  Past Medical History:  Diagnosis Date   Aortic stenosis    Arthritis    Atrial fibrillation (HCC)    BPH (benign prostatic hyperplasia)    Essential hypertension    GERD (gastroesophageal reflux disease)    Heart murmur    History of DVT (deep vein thrombosis)    Positive anticardiolipin   History of hiatal hernia    History of stroke 2010   Lupus (HCC)    PAD (peripheral artery disease) (HCC)    Right below-knee amputation in 2019   Pulmonary embolism (HCC)    hx of 10 years ago   Shingles    Stroke [facility])    Tinnitus    Wears dentures    Wears glasses     Past Surgical History:  Procedure Laterality Date   ABDOMINAL AORTOGRAM W/LOWER EXTREMITY N/A 02/17/2018   Procedure: ABDOMINAL AORTOGRAM W/LOWER EXTREMITY;   Surgeon: Maeola Harman, MD;  Location: [facility] INVASIVE CV LAB;  Service: Cardiovascular;  Laterality: N/A;   ABDOMINAL AORTOGRAM W/LOWER EXTREMITY Left 12/30/2020   Procedure: ABDOMINAL AORTOGRAM W/LOWER EXTREMITY;  Surgeon: Sherren Kerns, MD;  Location: [facility] INVASIVE CV LAB;  Service: Cardiovascular;  Laterality: Left;   AMPUTATION Right 03/03/2018   Procedure: AMPUTATION RIGHT SECOND TOE;  Surgeon: Sherren Kerns, MD;  Location: [facility] OR;  Service: Vascular;  Laterality: Right;   AMPUTATION Right 03/18/2018   Procedure: AMPUTATION BELOW KNEE;  Surgeon: Sherren Kerns, MD;  Location: [facility] OR;  Service: Vascular;  Laterality: Right;   BELOW KNEE LEG AMPUTATION Right 03/18/2018   bilateral cataract surgery      BIOPSY N/A 10/22/2017   Procedure: BIOPSY;  Surgeon: Franky Macho, MD;  [facility];  Service: Gastroenterology;  Laterality: N/A;   CATARACT EXTRACTION Bilateral 09/20/2016   COLONOSCOPY N/A 09/18/2016   Dr. Lovell Sheehan: normal   ESOPHAGOGASTRODUODENOSCOPY N/A 10/22/2017   Dr. Lovell Sheehan: normal duodenum, gastric mucosal atrophy but negative H.pylori (Clotest negative), normal GE junction   GIVENS CAPSULE STUDY N/A 01/27/2018   Procedure: GIVENS CAPSULE STUDY;  Surgeon:  Rourk, Gerrit Friends, MD;  Location: [facility];  Service: Endoscopy;  Laterality: N/A;  7:00am   HERNIA REPAIR     2010/laparoscopic/right   IVC FILTER INSERTION     01/2013    KNEE ARTHROSCOPY Right    2007   MULTIPLE TOOTH EXTRACTIONS     PERIPHERAL VASCULAR BALLOON ANGIOPLASTY  02/17/2018   Procedure: PERIPHERAL VASCULAR BALLOON ANGIOPLASTY;  Surgeon: Maeola Harman, MD;  Location: [facility] INVASIVE CV LAB;  Service: Cardiovascular;;   PERIPHERAL VASCULAR BALLOON ANGIOPLASTY Left 12/30/2020   Procedure: PERIPHERAL VASCULAR BALLOON ANGIOPLASTY;  Surgeon: Sherren Kerns, MD;  Location: [facility] INVASIVE CV LAB;  Service: Cardiovascular;  Laterality: Left;  PT   TOTAL HIP ARTHROPLASTY Right 05/01/2017    Procedure: RIGHT TOTAL HIP ARTHROPLASTY ANTERIOR APPROACH;  Surgeon: Ollen Gross, MD;  Location: [facility] ORS;  Service: Orthopedics;  Laterality: Right;   TOTAL KNEE ARTHROPLASTY Left 12/13/2014   Procedure: LEFT TOTAL KNEE ARTHROPLASTY;  Surgeon: Ollen Gross, MD;  Location: [facility] OR[facility];  Service: Orthopedics;  Laterality: Left;   TOTAL KNEE ARTHROPLASTY Right 03/21/2015   Procedure: RIGHT TOTAL KNEE ARTHROPLASTY;  Surgeon: Ollen Gross, MD;  Location: [facility];  Service: Orthopedics;  Laterality: Right;   TRANSURETHRAL RESECTION OF PROSTATE N/A 04/20/2021   Procedure: TRANSURETHRAL RESECTION OF THE PROSTATE (TURP);  Surgeon: Marcine Matar, MD;  Location: [facility] OR[facility];  Service: Urology;  Laterality: N/A;  90 MINS    Allergies  Allergen Reactions   Sulfa Antibiotics     Weak and dizziness    Current Outpatient Medications  Medication Sig Dispense Refill   acetaminophen (TYLENOL) 650 MG CR tablet Take 650 mg by mouth at bedtime.     Bioflavonoid Products (ESTER C PO) Take 500 mg by mouth daily.     clopidogrel (PLAVIX) 75 MG tablet 1 tablet     docusate sodium (COLACE) 100 MG capsule Take 100 mg by mouth daily.     hydroxychloroquine (PLAQUENIL) 200 MG tablet Take 200 mg by mouth daily.     lisinopril-hydrochlorothiazide (PRINZIDE,ZESTORETIC) 10-12.5 MG per tablet Take 1 tablet by mouth daily with breakfast.      Multiple Vitamin (MULTIVITAMIN WITH MINERALS) TABS tablet Take 1 tablet by mouth daily. Centrum Silver     Omega-3 350 MG CPDR Take 350 mg by mouth daily.     omeprazole (PRILOSEC) 20 MG capsule Take 20 mg by mouth daily.     predniSONE (DELTASONE) 5 MG tablet Take 5 mg by mouth daily with breakfast.     solifenacin (VESICARE) 10 MG tablet Take 1 tablet (10 mg total) by mouth daily. 90 tablet 3   traMADol (ULTRAM) 50 MG tablet Take 50 mg by mouth daily as needed for pain.     warfarin (COUMADIN) 4 MG tablet      No current facility-administered medications for this visit.    Family  History  Problem Relation Age of Onset   Hypertension Other    Stroke Mother    Stroke Father    Heart failure Sister    CAD Brother        CABG age 62   Colon cancer Neg Hx     Social History   Socioeconomic History   Marital status: Married    Spouse name: Not on file   Number of children: Not on file   Years of education: Not on file   Highest education level: Not on file  Occupational History   Not on file  Tobacco Use  Smoking status: Former    Packs/day: 0.50    Years: 40.00    Pack years: 20.00    Types: Cigarettes    Quit date: 10/02/1983    Years since quitting: 37.8   Smokeless tobacco: Never  Vaping Use   Vaping Use: Never used  Substance and Sexual Activity   Alcohol use: No   Drug use: No   Sexual activity: Not on file  Other Topics Concern   Not on file  Social History Narrative   Not on file   Social Determinants of Health   Financial Resource Strain: Not on file  Food Insecurity: Not on file  Transportation Needs: Not on file  Physical Activity: Not on file  Stress: Not on file  Social Connections: Not on file  Intimate Partner Violence: Not on file     REVIEW OF SYSTEMS:   [X]  denotes positive finding, [ ]  denotes negative finding Cardiac  Comments:  Chest pain or chest pressure:    Shortness of breath upon exertion:    Short of breath when lying flat:    Irregular heart rhythm:        Vascular    Pain in calf, thigh, or hip brought on by ambulation:    Pain in feet at night that wakes you up from your sleep:     Blood clot in your veins:    Leg swelling:         Pulmonary    Oxygen at home:    Productive cough:     Wheezing:         Neurologic    Sudden weakness in arms or legs:     Sudden numbness in arms or legs:     Sudden onset of difficulty speaking or slurred speech:    Temporary loss of vision in one eye:     Problems with dizziness:         Gastrointestinal    Blood in stool:     Vomited blood:          Genitourinary    Burning when urinating:     Blood in urine:        Psychiatric    Major depression:         Hematologic    Bleeding problems:    Problems with blood clotting too easily:        Skin    Rashes or ulcers:        Constitutional    Fever or chills:      PHYSICAL EXAMINATION:  Today's Vitals   07/12/21 1421 07/12/21 1423  BP: (!) 148/78   Pulse: 69   Resp: 16   Temp: 98.1 F (36.7 C)   TempSrc: Temporal   SpO2: 96%   Weight: 209 lb 6.4 oz (95 kg)   Height: 5\' 8"  (1.727 m)   PainSc: 6  6   PainLoc: Shoulder    Body mass index is 31.84 kg/m.   General:  WDWN in NAD; vital signs documented above Gait: Not observed HENT: WNL, normocephalic Pulmonary: normal non-labored breathing , without wheezing Cardiac: regular HR, without Murmur; without carotid bruits Skin: without rashes Vascular Exam/Pulses:  Right Left  Radial 2+ (normal) 2+ (normal)  DP BKA Brisk doppler signal  PT BKA Brisk doppler signal  Peroneal BKA Faint monophasic doppler   Extremities: without ischemic changes, without Gangrene , without cellulitis; without open wounds  Left leg   Musculoskeletal: no muscle wasting or atrophy  Neurologic:  A&O X 3 Psychiatric:  The pt has Normal affect.   Non-Invasive Vascular Imaging:   ABI's/TBI's on 07/12/2021: Right:  BKA Left:  1/66/0.22 - Great toe pressure: 34  LLE Arterial duplex on 07/12/2021: +-----------+--------+-----+--------+----------+--------+  LEFT       PSV cm/sRatioStenosisWaveform  Comments  +-----------+--------+-----+--------+----------+--------+  CFA Prox   143                  triphasic           +-----------+--------+-----+--------+----------+--------+  DFA        121                  biphasic            +-----------+--------+-----+--------+----------+--------+  SFA Prox   113                  triphasic           +-----------+--------+-----+--------+----------+--------+  SFA Mid     114                  triphasic           +-----------+--------+-----+--------+----------+--------+  SFA Distal 94                   triphasic           +-----------+--------+-----+--------+----------+--------+  POP Prox   85                   triphasic           +-----------+--------+-----+--------+----------+--------+  POP Mid    75                   triphasic           +-----------+--------+-----+--------+----------+--------+  POP Distal 82                   triphasic           +-----------+--------+-----+--------+----------+--------+  ATA Distal 27                   monophasic          +-----------+--------+-----+--------+----------+--------+  PTA Distal 106                  monophasicbrisk     +-----------+--------+-----+--------+----------+--------+  PERO Distal                               NV        +-----------+--------+-----+--------+----------+--------+  Summary:  Left: No signifcant stenosis or obstruction. Probable tibial artery  occlusive disease.   Previous ABI's/TBI's on 04/12/2021: Right:  BKA  Left:  1/58/0 - Great toe pressure:  0  Previous arterial duplex on 04/12/2021: +-----------+--------+-----+--------+---------+--------+  LEFT       PSV cm/sRatioStenosisWaveform Comments  +-----------+--------+-----+--------+---------+--------+  CFA Distal 140                  biphasic           +-----------+--------+-----+--------+---------+--------+  DFA        129                  biphasic           +-----------+--------+-----+--------+---------+--------+  SFA Prox   132                  biphasic           +-----------+--------+-----+--------+---------+--------+  SFA Mid    113                  biphasic           +-----------+--------+-----+--------+---------+--------+  SFA Distal 161                  triphasic          +-----------+--------+-----+--------+---------+--------+   POP Prox   107                  triphasic          +-----------+--------+-----+--------+---------+--------+  POP Distal 81                   biphasic           +-----------+--------+-----+--------+---------+--------+  ATA Distal 143                  triphasic          +-----------+--------+-----+--------+---------+--------+  PTA Distal 75                   biphasic           +-----------+--------+-----+--------+---------+--------+  PERO Distal                     absent             +-----------+--------+-----+--------+---------+--------+    Summary:  Left: No significant stenosis or obstruction. Left peroneal artery not  visualized.   ASSESSMENT/PLAN:: 80 y.o. male here for follow up for PAD with hx of angioplasty of his left posterior tibial artery on 12/30/20 for a left 3rd toe ulceration on 12/30/2020 by Dr. Darrick Penna.  -pt doing well with brisk doppler signals left DP/PT and faint monophasic doppler of peroneal.  He does have some claudication sx.  Encouraged him to continue walking as tolerated.  He gets tired easily, which does limit him.   -pt Plavix discontinued a few months ago.  He is not taking an aspirin.  He he can tolerate it and there is no contraindications to  a baby asa, he would benefit from this.  He is going to see his PCP later this week and if no contraindications, will start baby asa.   -pt will f/u in 6 months with ABI and LLE duplex.  They will call sooner if there are any issues.  -given Dr. Darrick Penna has retired, Dr. Randie Heinz will become his vascular surgeon as this needs to be listed for insurance purposes.     Doreatha Massed, [facility] [phone]  Clinic MD:   Randie Heinz

## 2021-07-13 ENCOUNTER — Other Ambulatory Visit: Payer: Self-pay

## 2021-07-13 DIAGNOSIS — I739 Peripheral vascular disease, unspecified: Secondary | ICD-10-CM

## 2021-07-14 DIAGNOSIS — I4891 Unspecified atrial fibrillation: Secondary | ICD-10-CM | POA: Diagnosis not present

## 2021-07-14 DIAGNOSIS — Z23 Encounter for immunization: Secondary | ICD-10-CM | POA: Diagnosis not present

## 2021-07-14 DIAGNOSIS — I82499 Acute embolism and thrombosis of other specified deep vein of unspecified lower extremity: Secondary | ICD-10-CM | POA: Diagnosis not present

## 2021-07-21 DIAGNOSIS — Z23 Encounter for immunization: Secondary | ICD-10-CM | POA: Diagnosis not present

## 2021-07-24 DIAGNOSIS — H26492 Other secondary cataract, left eye: Secondary | ICD-10-CM | POA: Diagnosis not present

## 2021-07-24 DIAGNOSIS — Z0100 Encounter for examination of eyes and vision without abnormal findings: Secondary | ICD-10-CM | POA: Diagnosis not present

## 2021-07-24 DIAGNOSIS — H353190 Nonexudative age-related macular degeneration, unspecified eye, stage unspecified: Secondary | ICD-10-CM | POA: Diagnosis not present

## 2021-07-24 DIAGNOSIS — H52229 Regular astigmatism, unspecified eye: Secondary | ICD-10-CM | POA: Diagnosis not present

## 2021-08-15 DIAGNOSIS — Z86711 Personal history of pulmonary embolism: Secondary | ICD-10-CM | POA: Diagnosis not present

## 2021-08-15 DIAGNOSIS — I4891 Unspecified atrial fibrillation: Secondary | ICD-10-CM | POA: Diagnosis not present

## 2021-09-12 DIAGNOSIS — R69 Illness, unspecified: Secondary | ICD-10-CM | POA: Diagnosis not present

## 2021-09-12 DIAGNOSIS — R5381 Other malaise: Secondary | ICD-10-CM | POA: Diagnosis not present

## 2021-09-13 DIAGNOSIS — S39012A Strain of muscle, fascia and tendon of lower back, initial encounter: Secondary | ICD-10-CM | POA: Diagnosis not present

## 2021-09-13 DIAGNOSIS — I4891 Unspecified atrial fibrillation: Secondary | ICD-10-CM | POA: Diagnosis not present

## 2021-09-20 DIAGNOSIS — R519 Headache, unspecified: Secondary | ICD-10-CM | POA: Diagnosis not present

## 2021-09-20 DIAGNOSIS — D519 Vitamin B12 deficiency anemia, unspecified: Secondary | ICD-10-CM | POA: Diagnosis not present

## 2021-09-20 DIAGNOSIS — I4891 Unspecified atrial fibrillation: Secondary | ICD-10-CM | POA: Diagnosis not present

## 2021-09-20 DIAGNOSIS — D649 Anemia, unspecified: Secondary | ICD-10-CM | POA: Diagnosis not present

## 2021-09-20 DIAGNOSIS — R296 Repeated falls: Secondary | ICD-10-CM | POA: Diagnosis not present

## 2021-09-20 DIAGNOSIS — M545 Low back pain, unspecified: Secondary | ICD-10-CM | POA: Diagnosis not present

## 2021-09-20 DIAGNOSIS — R300 Dysuria: Secondary | ICD-10-CM | POA: Diagnosis not present

## 2021-09-20 DIAGNOSIS — D509 Iron deficiency anemia, unspecified: Secondary | ICD-10-CM | POA: Diagnosis not present

## 2021-09-21 DIAGNOSIS — I4891 Unspecified atrial fibrillation: Secondary | ICD-10-CM | POA: Diagnosis not present

## 2021-09-21 DIAGNOSIS — M5126 Other intervertebral disc displacement, lumbar region: Secondary | ICD-10-CM | POA: Diagnosis not present

## 2021-09-21 DIAGNOSIS — M545 Low back pain, unspecified: Secondary | ICD-10-CM | POA: Diagnosis not present

## 2021-09-21 DIAGNOSIS — R531 Weakness: Secondary | ICD-10-CM | POA: Diagnosis not present

## 2021-09-21 DIAGNOSIS — G319 Degenerative disease of nervous system, unspecified: Secondary | ICD-10-CM | POA: Diagnosis not present

## 2021-09-25 NOTE — Progress Notes (Signed)
This is a duplicate note.  Please see other note from this date.     Past Medical History:  Diagnosis Date   Aortic stenosis    Arthritis    Atrial fibrillation (HCC)    BPH (benign prostatic hyperplasia)    Essential hypertension    GERD (gastroesophageal reflux disease)    Heart murmur    History of DVT (deep vein thrombosis)    Positive anticardiolipin   History of hiatal hernia    History of stroke 2010   Lupus ([place])    PAD (peripheral artery disease) ([place])    Right below-knee amputation in 2019   Pulmonary embolism (HCC)    hx of 10 years ago   Shingles    Stroke [facility])    Tinnitus    Wears dentures    Wears glasses     Past Surgical History:  Procedure Laterality Date   ABDOMINAL AORTOGRAM W/LOWER EXTREMITY N/A 02/17/2018   Procedure: ABDOMINAL AORTOGRAM W/LOWER EXTREMITY;  Surgeon: [name], MD;  Location: [place] [facility];  Service: Cardiovascular;  Laterality: N/A;   ABDOMINAL AORTOGRAM W/LOWER EXTREMITY Left 12/30/2020   Procedure: ABDOMINAL AORTOGRAM W/LOWER EXTREMITY;  Surgeon: [name], MD;  Location: [place] [facility];  Service: Cardiovascular;  Laterality: Left;   AMPUTATION Right 03/03/2018   Procedure: AMPUTATION RIGHT SECOND TOE;  Surgeon: [name], MD;  Location: [place];  Service: Vascular;  Laterality: Right;   AMPUTATION Right 03/18/2018   Procedure: AMPUTATION BELOW KNEE;  Surgeon: [name], MD;  Location: [place];  Service: Vascular;  Laterality: Right;   BELOW KNEE LEG AMPUTATION Right 03/18/2018   bilateral cataract surgery      BIOPSY N/A 10/22/2017   Procedure: BIOPSY;  Surgeon: [name], MD;  [facility];  Service: Gastroenterology;  Laterality: N/A;   CATARACT EXTRACTION Bilateral 09/20/2016   COLONOSCOPY N/A 09/18/2016   Dr. [name]: normal   ESOPHAGOGASTRODUODENOSCOPY N/A 10/22/2017   Dr. [name]: normal duodenum, gastric mucosal atrophy but negative H.pylori (Clotest negative), normal GE  junction   GIVENS CAPSULE STUDY N/A 01/27/2018   Procedure: GIVENS CAPSULE STUDY;  Surgeon: [name], MD;  [facility];  Service: Endoscopy;  Laterality: N/A;  7:00am   HERNIA REPAIR     2010/laparoscopic/right   IVC FILTER INSERTION     01/2013    KNEE ARTHROSCOPY Right    2007   MULTIPLE TOOTH EXTRACTIONS     PERIPHERAL VASCULAR BALLOON ANGIOPLASTY  02/17/2018   Procedure: PERIPHERAL VASCULAR BALLOON ANGIOPLASTY;  Surgeon: [name], MD;  Location: [place] [facility];  Service: Cardiovascular;;   PERIPHERAL VASCULAR BALLOON ANGIOPLASTY Left 12/30/2020   Procedure: PERIPHERAL VASCULAR BALLOON ANGIOPLASTY;  Surgeon: [name], MD;  Location: [place] [facility];  Service: Cardiovascular;  Laterality: Left;  PT   TOTAL HIP ARTHROPLASTY Right 05/01/2017   Procedure: RIGHT TOTAL HIP ARTHROPLASTY ANTERIOR APPROACH;  Surgeon: [name], MD;  [facility];  Service: Orthopedics;  Laterality: Right;   TOTAL KNEE ARTHROPLASTY Left 12/13/2014   Procedure: LEFT TOTAL KNEE ARTHROPLASTY;  Surgeon: [name], MD;  [facility];  Service: Orthopedics;  Laterality: Left;   TOTAL KNEE ARTHROPLASTY Right 03/21/2015   Procedure: RIGHT TOTAL KNEE ARTHROPLASTY;  Surgeon: [name], MD;  [facility];  Service: Orthopedics;  Laterality: Right;   TRANSURETHRAL RESECTION OF PROSTATE N/A 04/20/2021   Procedure: TRANSURETHRAL RESECTION OF THE PROSTATE (TURP);  Surgeon: Franchot Gallo, MD;  [facility];  Service: Urology;  Laterality: N/A;  90 MINS    Home Medications:  Allergies as of 06/27/2021       Reactions   Sulfa Antibiotics    Weak and dizziness        Medication List        Accurate as of June 27, 2021 11:59 PM. If you have any questions, ask your nurse or doctor.          STOP taking these medications    cephALEXin 500 MG capsule Commonly known as: KEFLEX Stopped by: Jorja Loa, MD       TAKE these  medications    acetaminophen 650 MG CR tablet Commonly known as: TYLENOL Take 650 mg by mouth at bedtime.   clopidogrel 75 MG tablet Commonly known as: PLAVIX 1 tablet   docusate sodium 100 MG capsule Commonly known as: COLACE Take 100 mg by mouth daily.   ESTER C PO Take 500 mg by mouth daily.   hydroxychloroquine 200 MG tablet Commonly known as: PLAQUENIL Take 200 mg by mouth daily.   lisinopril-hydrochlorothiazide 10-12.5 MG tablet Commonly known as: ZESTORETIC Take 1 tablet by mouth daily with breakfast.   multivitamin with minerals Tabs tablet Take 1 tablet by mouth daily. Centrum Silver   Omega-3 350 MG Cpdr Take 350 mg by mouth daily.   omeprazole 20 MG capsule Commonly known as: PRILOSEC Take 20 mg by mouth daily.   predniSONE 5 MG tablet Commonly known as: DELTASONE Take 5 mg by mouth daily with breakfast.   solifenacin 10 MG tablet Commonly known as: VESICARE Take 1 tablet (10 mg total) by mouth daily. Started by: Jorja Loa, MD   traMADol 50 MG tablet Commonly known as: ULTRAM Take 50 mg by mouth daily as needed for pain.   warfarin 4 MG tablet Commonly known as: COUMADIN        Allergies:  Allergies  Allergen Reactions   Sulfa Antibiotics     Weak and dizziness    Family History  Problem Relation Age of Onset   Hypertension Other    Stroke Mother    Stroke Father    Heart failure Sister    CAD Brother        CABG age 34   Colon cancer Neg Hx     Social History:  reports that he quit smoking about 38 years ago. His smoking use included cigarettes. He has a 20.00 pack-year smoking history. He has never used smokeless tobacco. He reports that he does not drink alcohol and does not use drugs.  ROS: A complete review of systems was performed.  All systems are negative except for pertinent findings as noted.  Physical Exam:  Vital signs in last 24 hours: BP 140/74    Pulse 72    Temp 97.8 F (36.6 C)    Wt 212 lb (96.2  kg)    BMI 32.23 kg/m  Constitutional:  Alert and oriented, No acute distress Cardiovascular: Regular rate  Respiratory: Normal respiratory effort GI: Abdomen is soft, nontender, nondistended, no abdominal masses. No CVAT.  Genitourinary: Normal male phallus, testes are descended bilaterally and non-tender and without masses, scrotum is normal in appearance without lesions or masses, perineum is normal on inspection. Lymphatic: No lymphadenopathy Neurologic: Grossly intact, no focal deficits Psychiatric: Normal mood and affect  I have reviewed prior pt notes  I have reviewed notes from referring/previous physicians  I have reviewed urinalysis results  I have independently  reviewed prior imaging  I have reviewed prior PSA results  I have reviewed prior urine culture   Impression/Assessment:    Plan:

## 2021-09-26 ENCOUNTER — Ambulatory Visit: Payer: Medicare HMO | Admitting: Urology

## 2021-09-26 ENCOUNTER — Other Ambulatory Visit: Payer: Self-pay

## 2021-09-26 ENCOUNTER — Encounter: Payer: Self-pay | Admitting: Urology

## 2021-09-26 VITALS — BP 135/70 | HR 83

## 2021-09-26 DIAGNOSIS — N390 Urinary tract infection, site not specified: Secondary | ICD-10-CM | POA: Diagnosis not present

## 2021-09-26 DIAGNOSIS — R3915 Urgency of urination: Secondary | ICD-10-CM | POA: Diagnosis not present

## 2021-09-26 LAB — URINALYSIS, ROUTINE W REFLEX MICROSCOPIC
Bilirubin, UA: NEGATIVE
Glucose, UA: NEGATIVE
Ketones, UA: NEGATIVE
Leukocytes,UA: NEGATIVE
Nitrite, UA: NEGATIVE
Protein,UA: NEGATIVE
RBC, UA: NEGATIVE
Specific Gravity, UA: 1.015 (ref 1.005–1.030)
Urobilinogen, Ur: 0.2 mg/dL (ref 0.2–1.0)
pH, UA: 7 (ref 5.0–7.5)

## 2021-09-26 LAB — BLADDER SCAN AMB NON-IMAGING

## 2021-09-26 NOTE — Addendum Note (Signed)
Addended by: Winn Jock on: 09/26/2021 03:29 PM   Modules accepted: Orders

## 2021-09-26 NOTE — Progress Notes (Signed)
History of Present Illness: Here for follow-up of BPH, history of TURP on 7.22.2022.  Prior to that, he had a UroLift which initially worked, but unfortunately he had incomplete emptying.  At his last visit in September, he had had improvement of his lower urinary tract symptoms with Myrbetriq.  He was switched over to Solifenacin.  Unfortunately, he has had significant constipation with worsening lower urinary tract symptomatology.  Since that visit, he is also had a fall from a stroke and he has had a fracture of one of his lumbar vertebral bodies.    Past Medical History:  Diagnosis Date   Aortic stenosis    Arthritis    Atrial fibrillation (HCC)    BPH (benign prostatic hyperplasia)    Essential hypertension    GERD (gastroesophageal reflux disease)    Heart murmur    History of DVT (deep vein thrombosis)    Positive anticardiolipin   History of hiatal hernia    History of stroke 2010   Lupus ([place])    PAD (peripheral artery disease) ([place])    Right below-knee amputation in 2019   Pulmonary embolism (HCC)    hx of 10 years ago   Shingles    Stroke [facility])    Tinnitus    Wears dentures    Wears glasses     Past Surgical History:  Procedure Laterality Date   ABDOMINAL AORTOGRAM W/LOWER EXTREMITY N/A 02/17/2018   Procedure: ABDOMINAL AORTOGRAM W/LOWER EXTREMITY;  Surgeon: [name], MD;  Location: [place] [facility];  Service: Cardiovascular;  Laterality: N/A;   ABDOMINAL AORTOGRAM W/LOWER EXTREMITY Left 12/30/2020   Procedure: ABDOMINAL AORTOGRAM W/LOWER EXTREMITY;  Surgeon: [name], MD;  Location: [place] [facility];  Service: Cardiovascular;  Laterality: Left;   AMPUTATION Right 03/03/2018   Procedure: AMPUTATION RIGHT SECOND TOE;  Surgeon: [name], MD;  Location: [place];  Service: Vascular;  Laterality: Right;   AMPUTATION Right 03/18/2018   Procedure: AMPUTATION BELOW KNEE;  Surgeon: [name], MD;  Location: [place];  Service: Vascular;   Laterality: Right;   BELOW KNEE LEG AMPUTATION Right 03/18/2018   bilateral cataract surgery      BIOPSY N/A 10/22/2017   Procedure: BIOPSY;  Surgeon: [name], MD;  [facility];  Service: Gastroenterology;  Laterality: N/A;   CATARACT EXTRACTION Bilateral 09/20/2016   COLONOSCOPY N/A 09/18/2016   Dr. [name]: normal   ESOPHAGOGASTRODUODENOSCOPY N/A 10/22/2017   Dr. [name]: normal duodenum, gastric mucosal atrophy but negative H.pylori (Clotest negative), normal GE junction   GIVENS CAPSULE STUDY N/A 01/27/2018   Procedure: GIVENS CAPSULE STUDY;  Surgeon: [name], MD;  [facility];  Service: Endoscopy;  Laterality: N/A;  7:00am   HERNIA REPAIR     2010/laparoscopic/right   IVC FILTER INSERTION     01/2013    KNEE ARTHROSCOPY Right    2007   MULTIPLE TOOTH EXTRACTIONS     PERIPHERAL VASCULAR BALLOON ANGIOPLASTY  02/17/2018   Procedure: PERIPHERAL VASCULAR BALLOON ANGIOPLASTY;  Surgeon: [name], MD;  Location: [place] [facility];  Service: Cardiovascular;;   PERIPHERAL VASCULAR BALLOON ANGIOPLASTY Left 12/30/2020   Procedure: PERIPHERAL VASCULAR BALLOON ANGIOPLASTY;  Surgeon: [name], MD;  Location: [place] [facility];  Service: Cardiovascular;  Laterality: Left;  PT   TOTAL HIP ARTHROPLASTY Right 05/01/2017   Procedure: RIGHT TOTAL HIP ARTHROPLASTY ANTERIOR APPROACH;  Surgeon: [name], MD;  [facility];  Service: Orthopedics;  Laterality:  Right;   TOTAL KNEE ARTHROPLASTY Left 12/13/2014   Procedure: LEFT TOTAL KNEE ARTHROPLASTY;  Surgeon: [name], MD;  [facility];  Service: Orthopedics;  Laterality: Left;   TOTAL KNEE ARTHROPLASTY Right 03/21/2015   Procedure: RIGHT TOTAL KNEE ARTHROPLASTY;  Surgeon: [name], MD;  [facility];  Service: Orthopedics;  Laterality: Right;   TRANSURETHRAL RESECTION OF PROSTATE N/A 04/20/2021   Procedure: TRANSURETHRAL RESECTION OF THE PROSTATE (TURP);  Surgeon:  Franchot Gallo, MD;  [facility];  Service: Urology;  Laterality: N/A;  90 MINS    Home Medications:  Allergies as of 09/26/2021       Reactions   Sulfa Antibiotics    Weak and dizziness        Medication List        Accurate as of September 26, 2021  1:10 PM. If you have any questions, ask your nurse or doctor.          acetaminophen 650 MG CR tablet Commonly known as: TYLENOL Take 650 mg by mouth at bedtime.   clopidogrel 75 MG tablet Commonly known as: PLAVIX 1 tablet   docusate sodium 100 MG capsule Commonly known as: COLACE Take 100 mg by mouth daily.   ESTER C PO Take 500 mg by mouth daily.   hydroxychloroquine 200 MG tablet Commonly known as: PLAQUENIL Take 200 mg by mouth daily.   lisinopril-hydrochlorothiazide 10-12.5 MG tablet Commonly known as: ZESTORETIC Take 1 tablet by mouth daily with breakfast.   multivitamin with minerals Tabs tablet Take 1 tablet by mouth daily. Centrum Silver   Omega-3 350 MG Cpdr Take 350 mg by mouth daily.   omeprazole 20 MG capsule Commonly known as: PRILOSEC Take 20 mg by mouth daily.   predniSONE 5 MG tablet Commonly known as: DELTASONE Take 5 mg by mouth daily with breakfast.   solifenacin 10 MG tablet Commonly known as: VESICARE Take 1 tablet (10 mg total) by mouth daily.   traMADol 50 MG tablet Commonly known as: ULTRAM Take 50 mg by mouth daily as needed for pain.   warfarin 4 MG tablet Commonly known as: COUMADIN        Allergies:  Allergies  Allergen Reactions   Sulfa Antibiotics     Weak and dizziness    Family History  Problem Relation Age of Onset   Hypertension Other    Stroke Mother    Stroke Father    Heart failure Sister    CAD Brother        CABG age 96   Colon cancer Neg Hx     Social History:  reports that he quit smoking about 38 years ago. His smoking use included cigarettes. He has a 20.00 pack-year smoking history. He has never used smokeless tobacco. He  reports that he does not drink alcohol and does not use drugs.  ROS: A complete review of systems was performed.  All systems are negative except for pertinent findings as noted.  Physical Exam:  Vital signs in last 24 hours: There were no vitals taken for this visit. Constitutional:  Alert and oriented, No acute distress Cardiovascular: Regular rate  Respiratory: Normal respiratory effort Neurologic: Grossly intact, no focal deficits Psychiatric: Normal mood and affect  I have reviewed prior pt notes  I have reviewed urinalysis results  I have independently reviewed bladder scan-8 mL residual urine volume    Impression/Assessment:  1.  BPH, status post TURP with excellent emptying.  He does have overactive bladder symptoms  2.  OAB symptoms, better with Myrbetriq, worse with Solifenacin which also caused constipation  Plan:  1.  He will stop Solifenacin, we replaced this with Myrbetriq 50 mg which she will take every other day-I gave 12 weeks worth of samples which will hopefully last him for 6 months  2.  Office visit in 6 months to recheck

## 2021-09-26 NOTE — Progress Notes (Signed)
Urological Symptom Review  Patient is experiencing the following symptoms: Frequent urination Hard to postpone urination Get up at night to urinate Leakage of urine Weak stream   Review of Systems  Gastrointestinal (upper)  : Negative for upper GI symptoms  Gastrointestinal (lower) : Constipation  Constitutional : Negative for symptoms  Skin: Negative for skin symptoms  Eyes: Negative for eye symptoms  Ear/Nose/Throat : Negative for Ear/Nose/Throat symptoms  Hematologic/Lymphatic: Negative for Hematologic/Lymphatic symptoms  Cardiovascular : Negative for cardiovascular symptoms  Respiratory : Negative for respiratory symptoms  Endocrine: Negative for endocrine symptoms  Musculoskeletal: Negative for musculoskeletal symptoms  Neurological: Negative for neurological symptoms  Psychologic: Negative for psychiatric symptoms

## 2021-09-27 DIAGNOSIS — R296 Repeated falls: Secondary | ICD-10-CM | POA: Diagnosis not present

## 2021-09-27 DIAGNOSIS — R519 Headache, unspecified: Secondary | ICD-10-CM | POA: Diagnosis not present

## 2021-09-27 DIAGNOSIS — M545 Low back pain, unspecified: Secondary | ICD-10-CM | POA: Diagnosis not present

## 2021-09-27 DIAGNOSIS — I4891 Unspecified atrial fibrillation: Secondary | ICD-10-CM | POA: Diagnosis not present

## 2021-09-29 DIAGNOSIS — I4891 Unspecified atrial fibrillation: Secondary | ICD-10-CM | POA: Diagnosis not present

## 2021-09-29 DIAGNOSIS — M329 Systemic lupus erythematosus, unspecified: Secondary | ICD-10-CM | POA: Diagnosis not present

## 2021-09-29 DIAGNOSIS — I70229 Atherosclerosis of native arteries of extremities with rest pain, unspecified extremity: Secondary | ICD-10-CM | POA: Diagnosis not present

## 2021-09-29 DIAGNOSIS — Z87891 Personal history of nicotine dependence: Secondary | ICD-10-CM | POA: Diagnosis not present

## 2021-09-29 DIAGNOSIS — I10 Essential (primary) hypertension: Secondary | ICD-10-CM | POA: Diagnosis not present

## 2021-09-29 DIAGNOSIS — M328 Other forms of systemic lupus erythematosus: Secondary | ICD-10-CM | POA: Diagnosis not present

## 2021-10-05 ENCOUNTER — Ambulatory Visit (INDEPENDENT_AMBULATORY_CARE_PROVIDER_SITE_OTHER): Payer: Medicare HMO | Admitting: Physician Assistant

## 2021-10-05 ENCOUNTER — Other Ambulatory Visit: Payer: Self-pay

## 2021-10-05 VITALS — BP 126/69 | HR 69 | Temp 98.4°F | Resp 20 | Ht 68.0 in

## 2021-10-05 DIAGNOSIS — I739 Peripheral vascular disease, unspecified: Secondary | ICD-10-CM | POA: Diagnosis not present

## 2021-10-05 DIAGNOSIS — Z6833 Body mass index (BMI) 33.0-33.9, adult: Secondary | ICD-10-CM | POA: Insufficient documentation

## 2021-10-05 DIAGNOSIS — Z89511 Acquired absence of right leg below knee: Secondary | ICD-10-CM

## 2021-10-05 DIAGNOSIS — Z79899 Other long term (current) drug therapy: Secondary | ICD-10-CM | POA: Insufficient documentation

## 2021-10-05 DIAGNOSIS — S32050A Wedge compression fracture of fifth lumbar vertebra, initial encounter for closed fracture: Secondary | ICD-10-CM | POA: Diagnosis not present

## 2021-10-05 DIAGNOSIS — R7989 Other specified abnormal findings of blood chemistry: Secondary | ICD-10-CM | POA: Insufficient documentation

## 2021-10-05 DIAGNOSIS — M2550 Pain in unspecified joint: Secondary | ICD-10-CM | POA: Insufficient documentation

## 2021-10-05 NOTE — Progress Notes (Signed)
Office Note     CC:  follow up Requesting Provider:  Lanelle Bal, PA-C  HPI: Jose Bush is a 81 y.o. ([birth year]) male who presents due to difficulty walking with his prosthesis.  He has history of a right BKA in June 2019.  He states his socket of his prosthetic leg has become loose as well as the sleeve.  Because of this he has fallen twice recently.  He is in the process of getting insurance approval for a new prosthetic leg, socket, and sleeve.  He denies any claudication, rest pain, or nonhealing wounds of his left leg.  Left leg has history of angioplasty of posterior tibial artery on 12/30/2020 by Dr. Oneida Alar.  He has since healed his third toe ulcer.  He is on Coumadin for atrial fibrillation.   Past Medical History:  Diagnosis Date   Aortic stenosis    Arthritis    Atrial fibrillation (HCC)    BPH (benign prostatic hyperplasia)    Essential hypertension    GERD (gastroesophageal reflux disease)    Heart murmur    History of DVT (deep vein thrombosis)    Positive anticardiolipin   History of hiatal hernia    History of stroke 2010   Lupus ([place])    PAD (peripheral artery disease) ([place])    Right below-knee amputation in 2019   Pulmonary embolism (HCC)    hx of 10 years ago   Shingles    Stroke [facility])    Tinnitus    Wears dentures    Wears glasses     Past Surgical History:  Procedure Laterality Date   ABDOMINAL AORTOGRAM W/LOWER EXTREMITY N/A 02/17/2018   Procedure: ABDOMINAL AORTOGRAM W/LOWER EXTREMITY;  Surgeon: [name], MD;  Location: [place] [facility];  Service: Cardiovascular;  Laterality: N/A;   ABDOMINAL AORTOGRAM W/LOWER EXTREMITY Left 12/30/2020   Procedure: ABDOMINAL AORTOGRAM W/LOWER EXTREMITY;  Surgeon: [name], MD;  Location: [place] [facility];  Service: Cardiovascular;  Laterality: Left;   AMPUTATION Right 03/03/2018   Procedure: AMPUTATION RIGHT SECOND TOE;  Surgeon: [name], MD;  Location: [place];  Service:  Vascular;  Laterality: Right;   AMPUTATION Right 03/18/2018   Procedure: AMPUTATION BELOW KNEE;  Surgeon: [name], MD;  Location: [place];  Service: Vascular;  Laterality: Right;   BELOW KNEE LEG AMPUTATION Right 03/18/2018   bilateral cataract surgery      BIOPSY N/A 10/22/2017   Procedure: BIOPSY;  Surgeon: [name], MD;  [facility];  Service: Gastroenterology;  Laterality: N/A;   CATARACT EXTRACTION Bilateral 09/20/2016   COLONOSCOPY N/A 09/18/2016   Dr. [name]: normal   ESOPHAGOGASTRODUODENOSCOPY N/A 10/22/2017   Dr. [name]: normal duodenum, gastric mucosal atrophy but negative H.pylori (Clotest negative), normal GE junction   GIVENS CAPSULE STUDY N/A 01/27/2018   Procedure: GIVENS CAPSULE STUDY;  Surgeon: [name], MD;  [facility];  Service: Endoscopy;  Laterality: N/A;  7:00am   HERNIA REPAIR     2010/laparoscopic/right   IVC FILTER INSERTION     01/2013    KNEE ARTHROSCOPY Right    2007   MULTIPLE TOOTH EXTRACTIONS     PERIPHERAL VASCULAR BALLOON ANGIOPLASTY  02/17/2018   Procedure: PERIPHERAL VASCULAR BALLOON ANGIOPLASTY;  Surgeon: [name], MD;  Location: [place] [facility];  Service: Cardiovascular;;   PERIPHERAL VASCULAR BALLOON ANGIOPLASTY Left 12/30/2020   Procedure: PERIPHERAL VASCULAR BALLOON ANGIOPLASTY;  Surgeon: [name], MD;  Location: [facility]  INV[facility];  Service: Cardiovascular;  Laterality: Left;  PT   TOTAL HIP ARTHROPLASTY Right 05/01/2017   Procedure: RIGHT TOTAL HIP ARTHROPLASTY ANTERIOR APPROACH;  Surgeon: [name], MD;  [facility];  Service: Orthopedics;  Laterality: Right;   TOTAL KNEE ARTHROPLASTY Left 12/13/2014   Procedure: LEFT TOTAL KNEE ARTHROPLASTY;  Surgeon: [name], MD;  [facility];  Service: Orthopedics;  Laterality: Left;   TOTAL KNEE ARTHROPLASTY Right 03/21/2015   Procedure: RIGHT TOTAL KNEE ARTHROPLASTY;  Surgeon: [name], MD;  [facility];   Service: Orthopedics;  Laterality: Right;   TRANSURETHRAL RESECTION OF PROSTATE N/A 04/20/2021   Procedure: TRANSURETHRAL RESECTION OF THE PROSTATE (TURP);  Surgeon: Franchot Gallo, MD;  [facility];  Service: Urology;  Laterality: N/A;  35 MINS    Social History   Socioeconomic History   Marital status: Married    Spouse name: Not on file   Number of children: Not on file   Years of education: Not on file   Highest education level: Not on file  Occupational History   Not on file  Tobacco Use   Smoking status: Former    Packs/day: 0.50    Years: 40.00    Pack years: 20.00    Types: Cigarettes    Quit date: 10/02/1983    Years since quitting: 38.0   Smokeless tobacco: Never  Vaping Use   Vaping Use: Never used  Substance and Sexual Activity   Alcohol use: No   Drug use: No   Sexual activity: Not on file  Other Topics Concern   Not on file  Social History Narrative   Not on file   Social Determinants of Health   Financial Resource Strain: Not on file  Food Insecurity: Not on file  Transportation Needs: Not on file  Physical Activity: Not on file  Stress: Not on file  Social Connections: Not on file  Intimate Partner Violence: Not on file    Family History  Problem Relation Age of Onset   Hypertension Other    Stroke Mother    Stroke Father    Heart failure Sister    CAD Brother        CABG age 73   Colon cancer Neg Hx     Current Outpatient Medications  Medication Sig Dispense Refill   acetaminophen (TYLENOL) 650 MG CR tablet Take 650 mg by mouth at bedtime.     Bioflavonoid Products (ESTER C PO) Take 500 mg by mouth daily.     docusate sodium (COLACE) 100 MG capsule Take 100 mg by mouth daily.     Ferrous Gluconate-C-Folic Acid (IRON-C PO) Take by mouth.     hydroxychloroquine (PLAQUENIL) 200 MG tablet Take 200 mg by mouth daily.     lisinopril-hydrochlorothiazide (PRINZIDE,ZESTORETIC) 10-12.5 MG per tablet Take 1 tablet by mouth daily with  breakfast.      Multiple Vitamin (MULTIVITAMIN WITH MINERALS) TABS tablet Take 1 tablet by mouth daily. Centrum Silver     Omega-3 350 MG CPDR Take 350 mg by mouth daily.     omeprazole (PRILOSEC) 20 MG capsule Take 20 mg by mouth daily.     predniSONE (DELTASONE) 5 MG tablet Take 5 mg by mouth daily with breakfast.     solifenacin (VESICARE) 10 MG tablet Take 1 tablet (10 mg total) by mouth daily. 90 tablet 3   traMADol (ULTRAM) 50 MG tablet Take 50 mg by mouth daily as needed for pain.  warfarin (COUMADIN) 4 MG tablet      No current facility-administered medications for this visit.    Allergies  Allergen Reactions   Sulfa Antibiotics     Weak and dizziness     REVIEW OF SYSTEMS:   [X]  denotes positive finding, [ ]  denotes negative finding Cardiac  Comments:  Chest pain or chest pressure:    Shortness of breath upon exertion:    Short of breath when lying flat:    Irregular heart rhythm:        Vascular    Pain in calf, thigh, or hip brought on by ambulation:    Pain in feet at night that wakes you up from your sleep:     Blood clot in your veins:    Leg swelling:         Pulmonary    Oxygen at home:    Productive cough:     Wheezing:         Neurologic    Sudden weakness in arms or legs:     Sudden numbness in arms or legs:     Sudden onset of difficulty speaking or slurred speech:    Temporary loss of vision in one eye:     Problems with dizziness:         Gastrointestinal    Blood in stool:     Vomited blood:         Genitourinary    Burning when urinating:     Blood in urine:        Psychiatric    Major depression:         Hematologic    Bleeding problems:    Problems with blood clotting too easily:        Skin    Rashes or ulcers:        Constitutional    Fever or chills:      PHYSICAL EXAMINATION:  Vitals:   10/05/21 1516  BP: 126/69  Pulse: 69  Resp: 20  Temp: 98.4 F (36.9 C)  TempSrc: Temporal  SpO2: 98%  Height: 5\' 8"   (1.727 m)    General:  WDWN in NAD; vital signs documented above Gait: Not observed HENT: WNL, normocephalic Pulmonary: normal non-labored breathing , without Rales, rhonchi,  wheezing Cardiac: regular HR Abdomen: soft, NT, no masses Skin: without rashes Extremities: Left foot without wounds Musculoskeletal: no muscle wasting or atrophy  Neurologic: A&O X 3;  No focal weakness or paresthesias are detected Psychiatric:  The pt has Normal affect.   ASSESSMENT/PLAN:: 81 y.o. male here due to trouble walking with right leg prosthesis   -Patient had previously been walking without difficulty with a prosthetic right leg.  He however has since fallen several times due to the socket and sleeve becoming loose.  He is in need of a new prosthetic leg, sleeve, and socket.  If he receives these items I believe he will be able to return to prior functional status.  He will follow-up as scheduled for repeat imaging of left lower extremity PAD in the spring.   -The patient has a right Below Knee Amputation. The patient is well motivated to return to their prior functional status by utilizing a prosthesis to perform ADL's and maintain a healthy lifestyle. The patient has the physical and cognitive capacity to function with a prosthesis.   Functional Level: [facility] [facility] Ambulator: Has the ability or potential for ambulation with variable cadence, to traverse most environmental barriers, and may have  vocational, therapeutic, or exercise activity that demands prosthetic utilization beyond simple locomotion. Pt may benefit from Education officer, environmental.   Residual Limb History: The skin condition of the residual limb is intact. The patient will continue to monitor the skin of the residual limb and follow hygiene instructions.  The patient is experiencing no pain related to amputation  Prosthetic Prescription Plan: Counseling and education regarding prosthetic management will be provided to the patient  via a certified prosthetist. A multi-discipline team, including physical therapy, will manage the prosthetic fabrication, fitting and prosthetic gait training.     Jose Ligas, PA-C [facility] [phone]  Clinic MD:   Scot Dock

## 2021-10-06 DIAGNOSIS — I352 Nonrheumatic aortic (valve) stenosis with insufficiency: Secondary | ICD-10-CM | POA: Diagnosis not present

## 2021-10-06 DIAGNOSIS — I4891 Unspecified atrial fibrillation: Secondary | ICD-10-CM | POA: Diagnosis not present

## 2021-10-06 DIAGNOSIS — I3481 Nonrheumatic mitral (valve) annulus calcification: Secondary | ICD-10-CM | POA: Diagnosis not present

## 2021-10-06 DIAGNOSIS — I77810 Thoracic aortic ectasia: Secondary | ICD-10-CM | POA: Diagnosis not present

## 2021-10-06 DIAGNOSIS — I6523 Occlusion and stenosis of bilateral carotid arteries: Secondary | ICD-10-CM | POA: Diagnosis not present

## 2021-10-11 DIAGNOSIS — I739 Peripheral vascular disease, unspecified: Secondary | ICD-10-CM | POA: Diagnosis not present

## 2021-10-11 DIAGNOSIS — R011 Cardiac murmur, unspecified: Secondary | ICD-10-CM | POA: Diagnosis not present

## 2021-10-11 DIAGNOSIS — M329 Systemic lupus erythematosus, unspecified: Secondary | ICD-10-CM | POA: Diagnosis not present

## 2021-10-11 DIAGNOSIS — I10 Essential (primary) hypertension: Secondary | ICD-10-CM | POA: Diagnosis not present

## 2021-10-11 DIAGNOSIS — Z0001 Encounter for general adult medical examination with abnormal findings: Secondary | ICD-10-CM | POA: Diagnosis not present

## 2021-10-11 DIAGNOSIS — I70229 Atherosclerosis of native arteries of extremities with rest pain, unspecified extremity: Secondary | ICD-10-CM | POA: Diagnosis not present

## 2021-10-11 DIAGNOSIS — G459 Transient cerebral ischemic attack, unspecified: Secondary | ICD-10-CM | POA: Diagnosis not present

## 2021-10-11 DIAGNOSIS — I4891 Unspecified atrial fibrillation: Secondary | ICD-10-CM | POA: Diagnosis not present

## 2021-10-19 DIAGNOSIS — Z89511 Acquired absence of right leg below knee: Secondary | ICD-10-CM | POA: Diagnosis not present

## 2021-10-20 DIAGNOSIS — R569 Unspecified convulsions: Secondary | ICD-10-CM | POA: Diagnosis not present

## 2021-10-20 DIAGNOSIS — I10 Essential (primary) hypertension: Secondary | ICD-10-CM | POA: Diagnosis not present

## 2021-10-20 DIAGNOSIS — I4891 Unspecified atrial fibrillation: Secondary | ICD-10-CM | POA: Diagnosis not present

## 2021-10-23 ENCOUNTER — Ambulatory Visit (HOSPITAL_COMMUNITY)
Admission: RE | Admit: 2021-10-23 | Discharge: 2021-10-23 | Disposition: A | Discharge status: Home / Self Care | Payer: Medicare HMO | Source: Ambulatory Visit | Attending: Family Medicine | Admitting: Family Medicine

## 2021-10-23 ENCOUNTER — Other Ambulatory Visit (HOSPITAL_COMMUNITY): Payer: Self-pay | Admitting: Family Medicine

## 2021-10-23 ENCOUNTER — Other Ambulatory Visit: Payer: Self-pay | Admitting: Family Medicine

## 2021-10-23 ENCOUNTER — Other Ambulatory Visit: Payer: Self-pay

## 2021-10-23 ENCOUNTER — Other Ambulatory Visit: Payer: Self-pay | Admitting: Podiatry

## 2021-10-23 DIAGNOSIS — R600 Localized edema: Secondary | ICD-10-CM | POA: Diagnosis not present

## 2021-10-23 DIAGNOSIS — D519 Vitamin B12 deficiency anemia, unspecified: Secondary | ICD-10-CM | POA: Diagnosis not present

## 2021-10-23 DIAGNOSIS — D649 Anemia, unspecified: Secondary | ICD-10-CM | POA: Diagnosis not present

## 2021-10-23 DIAGNOSIS — I4891 Unspecified atrial fibrillation: Secondary | ICD-10-CM | POA: Diagnosis not present

## 2021-10-23 DIAGNOSIS — D529 Folate deficiency anemia, unspecified: Secondary | ICD-10-CM | POA: Diagnosis not present

## 2021-10-23 DIAGNOSIS — M7989 Other specified soft tissue disorders: Secondary | ICD-10-CM | POA: Diagnosis not present

## 2021-10-23 DIAGNOSIS — R531 Weakness: Secondary | ICD-10-CM | POA: Diagnosis not present

## 2021-10-23 DIAGNOSIS — M62838 Other muscle spasm: Secondary | ICD-10-CM | POA: Diagnosis not present

## 2021-10-23 DIAGNOSIS — D509 Iron deficiency anemia, unspecified: Secondary | ICD-10-CM | POA: Diagnosis not present

## 2021-10-25 DIAGNOSIS — R531 Weakness: Secondary | ICD-10-CM | POA: Diagnosis not present

## 2021-10-25 DIAGNOSIS — I4891 Unspecified atrial fibrillation: Secondary | ICD-10-CM | POA: Diagnosis not present

## 2021-10-25 DIAGNOSIS — M7989 Other specified soft tissue disorders: Secondary | ICD-10-CM | POA: Diagnosis not present

## 2021-10-25 DIAGNOSIS — M62838 Other muscle spasm: Secondary | ICD-10-CM | POA: Diagnosis not present

## 2021-10-26 DIAGNOSIS — S32050A Wedge compression fracture of fifth lumbar vertebra, initial encounter for closed fracture: Secondary | ICD-10-CM | POA: Diagnosis not present

## 2021-10-26 DIAGNOSIS — Z6830 Body mass index (BMI) 30.0-30.9, adult: Secondary | ICD-10-CM | POA: Diagnosis not present

## 2021-10-26 DIAGNOSIS — I10 Essential (primary) hypertension: Secondary | ICD-10-CM | POA: Diagnosis not present

## 2021-11-03 DIAGNOSIS — I4891 Unspecified atrial fibrillation: Secondary | ICD-10-CM | POA: Diagnosis not present

## 2021-11-10 DIAGNOSIS — I4891 Unspecified atrial fibrillation: Secondary | ICD-10-CM | POA: Diagnosis not present

## 2021-11-20 ENCOUNTER — Telehealth: Payer: Self-pay | Admitting: *Deleted

## 2021-11-20 NOTE — Progress Notes (Signed)
Office Note     CC:  follow up Requesting Provider:  Lanelle Bal, PA-C  HPI: Jose Bush is a 81 y.o. ([birth year]) male who presents to clinic for evaluation of left first and second toe ulceration.  He has a long history of peripheral vascular disease.  He underwent a right below-knee amputation in June 2019.  He underwent left lower extremity angiography with tibial angioplasty in April 2022 with Dr. Oneida Alar.  Over the past several weeks he has developed ulceration of the first and second toe.  There is redness and discoloration of the second toe.  This is mildly painful to him.  He is minimally ambulatory.  He is able to get around his house with his prosthetic and get to the bathroom, the kitchen, etc.  Past Medical History:  Diagnosis Date   Aortic stenosis    Arthritis    Atrial fibrillation (HCC)    BPH (benign prostatic hyperplasia)    Essential hypertension    GERD (gastroesophageal reflux disease)    Heart murmur    History of DVT (deep vein thrombosis)    Positive anticardiolipin   History of hiatal hernia    History of stroke 2010   Lupus ([place])    PAD (peripheral artery disease) ([place])    Right below-knee amputation in 2019   Pulmonary embolism (HCC)    hx of 10 years ago   Shingles    Stroke [facility])    Tinnitus    Wears dentures    Wears glasses     Past Surgical History:  Procedure Laterality Date   ABDOMINAL AORTOGRAM W/LOWER EXTREMITY N/A 02/17/2018   Procedure: ABDOMINAL AORTOGRAM W/LOWER EXTREMITY;  Surgeon: [name], MD;  Location: [place] [facility];  Service: Cardiovascular;  Laterality: N/A;   ABDOMINAL AORTOGRAM W/LOWER EXTREMITY Left 12/30/2020   Procedure: ABDOMINAL AORTOGRAM W/LOWER EXTREMITY;  Surgeon: [name], MD;  Location: [place] [facility];  Service: Cardiovascular;  Laterality: Left;   AMPUTATION Right 03/03/2018   Procedure: AMPUTATION RIGHT SECOND TOE;  Surgeon: [name], MD;  Location: [place];  Service:  Vascular;  Laterality: Right;   AMPUTATION Right 03/18/2018   Procedure: AMPUTATION BELOW KNEE;  Surgeon: [name], MD;  Location: [place];  Service: Vascular;  Laterality: Right;   BELOW KNEE LEG AMPUTATION Right 03/18/2018   bilateral cataract surgery      BIOPSY N/A 10/22/2017   Procedure: BIOPSY;  Surgeon: [name], MD;  [facility];  Service: Gastroenterology;  Laterality: N/A;   CATARACT EXTRACTION Bilateral 09/20/2016   COLONOSCOPY N/A 09/18/2016   Dr. [name]: normal   ESOPHAGOGASTRODUODENOSCOPY N/A 10/22/2017   Dr. [name]: normal duodenum, gastric mucosal atrophy but negative H.pylori (Clotest negative), normal GE junction   GIVENS CAPSULE STUDY N/A 01/27/2018   Procedure: GIVENS CAPSULE STUDY;  Surgeon: [name], MD;  [facility];  Service: Endoscopy;  Laterality: N/A;  7:00am   HERNIA REPAIR     2010/laparoscopic/right   IVC FILTER INSERTION     01/2013    KNEE ARTHROSCOPY Right    2007   MULTIPLE TOOTH EXTRACTIONS     PERIPHERAL VASCULAR BALLOON ANGIOPLASTY  02/17/2018   Procedure: PERIPHERAL VASCULAR BALLOON ANGIOPLASTY;  Surgeon: [name], MD;  Location: [place] [facility];  Service: Cardiovascular;;   PERIPHERAL VASCULAR BALLOON ANGIOPLASTY Left 12/30/2020   Procedure: PERIPHERAL VASCULAR BALLOON ANGIOPLASTY;  Surgeon: [name], MD;  Location: [place] [facility];  Service:  Cardiovascular;  Laterality: Left;  PT   TOTAL HIP ARTHROPLASTY Right 05/01/2017   Procedure: RIGHT TOTAL HIP ARTHROPLASTY ANTERIOR APPROACH;  Surgeon: [name], MD;  [facility];  Service: Orthopedics;  Laterality: Right;   TOTAL KNEE ARTHROPLASTY Left 12/13/2014   Procedure: LEFT TOTAL KNEE ARTHROPLASTY;  Surgeon: [name], MD;  [facility];  Service: Orthopedics;  Laterality: Left;   TOTAL KNEE ARTHROPLASTY Right 03/21/2015   Procedure: RIGHT TOTAL KNEE ARTHROPLASTY;  Surgeon: [name], MD;  [facility];   Service: Orthopedics;  Laterality: Right;   TRANSURETHRAL RESECTION OF PROSTATE N/A 04/20/2021   Procedure: TRANSURETHRAL RESECTION OF THE PROSTATE (TURP);  Surgeon: Franchot Gallo, MD;  [facility];  Service: Urology;  Laterality: N/A;  35 MINS    Social History   Socioeconomic History   Marital status: Married    Spouse name: Not on file   Number of children: Not on file   Years of education: Not on file   Highest education level: Not on file  Occupational History   Not on file  Tobacco Use   Smoking status: Former    Packs/day: 0.50    Years: 40.00    Pack years: 20.00    Types: Cigarettes    Quit date: 10/02/1983    Years since quitting: 38.1   Smokeless tobacco: Never  Vaping Use   Vaping Use: Never used  Substance and Sexual Activity   Alcohol use: No   Drug use: No   Sexual activity: Not on file  Other Topics Concern   Not on file  Social History Narrative   Not on file   Social Determinants of Health   Financial Resource Strain: Not on file  Food Insecurity: Not on file  Transportation Needs: Not on file  Physical Activity: Not on file  Stress: Not on file  Social Connections: Not on file  Intimate Partner Violence: Not on file    Family History  Problem Relation Age of Onset   Hypertension Other    Stroke Mother    Stroke Father    Heart failure Sister    CAD Brother        CABG age 51   Colon cancer Neg Hx     Current Outpatient Medications  Medication Sig Dispense Refill   acetaminophen (TYLENOL) 650 MG CR tablet Take 650 mg by mouth at bedtime.     Bioflavonoid Products (ESTER C PO) Take 500 mg by mouth daily.     docusate sodium (COLACE) 100 MG capsule Take 100 mg by mouth daily.     Ferrous Gluconate-C-Folic Acid (IRON-C PO) Take by mouth.     hydroxychloroquine (PLAQUENIL) 200 MG tablet Take 200 mg by mouth daily.     lisinopril-hydrochlorothiazide (PRINZIDE,ZESTORETIC) 10-12.5 MG per tablet Take 1 tablet by mouth daily with  breakfast.      Multiple Vitamin (MULTIVITAMIN WITH MINERALS) TABS tablet Take 1 tablet by mouth daily. Centrum Silver     Omega-3 350 MG CPDR Take 350 mg by mouth daily.     omeprazole (PRILOSEC) 20 MG capsule Take 20 mg by mouth daily.     predniSONE (DELTASONE) 5 MG tablet Take 5 mg by mouth daily with breakfast.     solifenacin (VESICARE) 10 MG tablet Take 1 tablet (10 mg total) by mouth daily. 90 tablet 3   traMADol (ULTRAM) 50 MG tablet Take 50 mg by mouth daily as needed for pain.     warfarin (COUMADIN) 4 MG tablet  No current facility-administered medications for this visit.    Allergies  Allergen Reactions   Sulfa Antibiotics     Weak and dizziness     REVIEW OF SYSTEMS:   [X]  denotes positive finding, [ ]  denotes negative finding Cardiac  Comments:  Chest pain or chest pressure:    Shortness of breath upon exertion:    Short of breath when lying flat:    Irregular heart rhythm:        Vascular    Pain in calf, thigh, or hip brought on by ambulation:    Pain in feet at night that wakes you up from your sleep:     Blood clot in your veins:    Leg swelling:         Pulmonary    Oxygen at home:    Productive cough:     Wheezing:         Neurologic    Sudden weakness in arms or legs:     Sudden numbness in arms or legs:     Sudden onset of difficulty speaking or slurred speech:    Temporary loss of vision in one eye:     Problems with dizziness:         Gastrointestinal    Blood in stool:     Vomited blood:         Genitourinary    Burning when urinating:     Blood in urine:        Psychiatric    Major depression:         Hematologic    Bleeding problems:    Problems with blood clotting too easily:        Skin    Rashes or ulcers:        Constitutional    Fever or chills:      PHYSICAL EXAMINATION:  Vitals:   11/21/21 1114  BP: 118/62  Pulse: 74  Resp: 20  Temp: 98.7 F (37.1 C)  SpO2: 100%  Weight: 209 lb (94.8 kg)  Height: 5'  8" (1.727 m)     General:  WDWN in NAD; vital signs documented above Gait: Not observed HENT: WNL, normocephalic Pulmonary: normal non-labored breathing , without Rales, rhonchi,  wheezing Cardiac: regular HR Abdomen: soft, NT, no masses Skin: without rashes Extremities: Left foot first and second distal digit ulceration Musculoskeletal: no muscle wasting or atrophy  Neurologic: A&O X 3;  No focal weakness or paresthesias are detected Psychiatric:  The pt has Normal affect.   ASSESSMENT/PLAN::  SAMIK BAPST is a 81 y.o. male with atherosclerosis of native arteries of left lower extremity causing ulceration.  Patient counseled patients with chronic limb threatening ischemia have an annual risk of cardiovascular mortality of 25% and a high risk of amputation.   WIfI score calculated based on clinical exam and non-invasive measurements. 1 / 3 / 0. Clinical stage 3. Moderate risk of amputation. High potential benefit from revascularization.  Recommend the following which can slow the progression of atherosclerosis and reduce the risk of major adverse cardiac / limb events:  Complete cessation from all tobacco products. Blood glucose control with goal A1c < 7%. Blood pressure control with goal blood pressure < 140/90 mmHg. Lipid reduction therapy with goal LDL-C <100 mg/dL (<70 if symptomatic from PAD).  Aspirin 81mg  PO QD.  Atorvastatin 40-80mg  PO QD (or other "high intensity" statin therapy).  Plan left lower extremity angiogram with possible intervention via right common femoral approach in  cath lab 11/24/21. Hold warfarin.

## 2021-11-20 NOTE — Telephone Encounter (Signed)
Wife called stating patients left 2nd toe is red, swollen, painful with numbness. ABI and duplex scheduled with follow up with Dr Lenell Antu as Dr Darrick Penna has retired.  Patient notified with appointment.

## 2021-11-20 NOTE — H&P (View-Only) (Signed)
Office Note     CC:  follow up Requesting Provider:  Lanelle Bal, PA-C  HPI: Jose Bush is a 81 y.o. (1940-10-26) male who presents to clinic for evaluation of left first and second toe ulceration.  He has a long history of peripheral vascular disease.  He underwent a right below-knee amputation in June 2019.  He underwent left lower extremity angiography with tibial angioplasty in April 2022 with Dr. Oneida Alar.  Over the past several weeks he has developed ulceration of the first and second toe.  There is redness and discoloration of the second toe.  This is mildly painful to him.  He is minimally ambulatory.  He is able to get around his house with his prosthetic and get to the bathroom, the kitchen, etc.  Past Medical History:  Diagnosis Date   Aortic stenosis    Arthritis    Atrial fibrillation (HCC)    BPH (benign prostatic hyperplasia)    Essential hypertension    GERD (gastroesophageal reflux disease)    Heart murmur    History of DVT (deep vein thrombosis)    Positive anticardiolipin   History of hiatal hernia    History of stroke 2010   Lupus ([place])    PAD (peripheral artery disease) ([place])    Right below-knee amputation in 2019   Pulmonary embolism (HCC)    hx of 10 years ago   Shingles    Stroke [facility])    Tinnitus    Wears dentures    Wears glasses     Past Surgical History:  Procedure Laterality Date   ABDOMINAL AORTOGRAM W/LOWER EXTREMITY N/A 02/17/2018   Procedure: ABDOMINAL AORTOGRAM W/LOWER EXTREMITY;  Surgeon: [name], MD;  Location: [facility];  Service: Cardiovascular;  Laterality: N/A;   ABDOMINAL AORTOGRAM W/LOWER EXTREMITY Left 12/30/2020   Procedure: ABDOMINAL AORTOGRAM W/LOWER EXTREMITY;  Surgeon: [name], MD;  Location: [facility];  Service: Cardiovascular;  Laterality: Left;   AMPUTATION Right 03/03/2018   Procedure: AMPUTATION RIGHT SECOND TOE;  Surgeon: [name], MD;  Location: [facility];  Service:  Vascular;  Laterality: Right;   AMPUTATION Right 03/18/2018   Procedure: AMPUTATION BELOW KNEE;  Surgeon: [name], MD;  Location: [facility];  Service: Vascular;  Laterality: Right;   BELOW KNEE LEG AMPUTATION Right 03/18/2018   bilateral cataract surgery      BIOPSY N/A 10/22/2017   Procedure: BIOPSY;  Surgeon: [name], MD;  [facility];  Service: Gastroenterology;  Laterality: N/A;   CATARACT EXTRACTION Bilateral 09/20/2016   COLONOSCOPY N/A 09/18/2016   Dr. [name]: normal   ESOPHAGOGASTRODUODENOSCOPY N/A 10/22/2017   Dr. [name]: normal duodenum, gastric mucosal atrophy but negative H.pylori (Clotest negative), normal GE junction   GIVENS CAPSULE STUDY N/A 01/27/2018   Procedure: GIVENS CAPSULE STUDY;  Surgeon: [name], MD;  [facility];  Service: Endoscopy;  Laterality: N/A;  7:00am   HERNIA REPAIR     2010/laparoscopic/right   IVC FILTER INSERTION     01/2013    KNEE ARTHROSCOPY Right    2007   MULTIPLE TOOTH EXTRACTIONS     PERIPHERAL VASCULAR BALLOON ANGIOPLASTY  02/17/2018   Procedure: PERIPHERAL VASCULAR BALLOON ANGIOPLASTY;  Surgeon: [name], MD;  Location: [facility];  Service: Cardiovascular;;   PERIPHERAL VASCULAR BALLOON ANGIOPLASTY Left 12/30/2020   Procedure: PERIPHERAL VASCULAR BALLOON ANGIOPLASTY;  Surgeon: [name], MD;  Location: [facility];  Service:  Cardiovascular;  Laterality: Left;  PT   TOTAL HIP ARTHROPLASTY Right 05/01/2017   Procedure: RIGHT TOTAL HIP ARTHROPLASTY ANTERIOR APPROACH;  Surgeon: [name], MD;  Location: [facility];  Service: Orthopedics;  Laterality: Right;   TOTAL KNEE ARTHROPLASTY Left 12/13/2014   Procedure: LEFT TOTAL KNEE ARTHROPLASTY;  Surgeon: [name], MD;  Location: [facility];  Service: Orthopedics;  Laterality: Left;   TOTAL KNEE ARTHROPLASTY Right 03/21/2015   Procedure: RIGHT TOTAL KNEE ARTHROPLASTY;  Surgeon: [name], MD;  Location: [facility];   Service: Orthopedics;  Laterality: Right;   TRANSURETHRAL RESECTION OF PROSTATE N/A 04/20/2021   Procedure: TRANSURETHRAL RESECTION OF THE PROSTATE (TURP);  Surgeon: Franchot Gallo, MD;  Location: [facility];  Service: Urology;  Laterality: N/A;  30 MINS    Social History   Socioeconomic History   Marital status: Married    Spouse name: Not on file   Number of children: Not on file   Years of education: Not on file   Highest education level: Not on file  Occupational History   Not on file  Tobacco Use   Smoking status: Former    Packs/day: 0.50    Years: 40.00    Pack years: 20.00    Types: Cigarettes    Quit date: 10/02/1983    Years since quitting: 38.1   Smokeless tobacco: Never  Vaping Use   Vaping Use: Never used  Substance and Sexual Activity   Alcohol use: No   Drug use: No   Sexual activity: Not on file  Other Topics Concern   Not on file  Social History Narrative   Not on file   Social Determinants of Health   Financial Resource Strain: Not on file  Food Insecurity: Not on file  Transportation Needs: Not on file  Physical Activity: Not on file  Stress: Not on file  Social Connections: Not on file  Intimate Partner Violence: Not on file    Family History  Problem Relation Age of Onset   Hypertension Other    Stroke Mother    Stroke Father    Heart failure Sister    CAD Brother        CABG age 52   Colon cancer Neg Hx     Current Outpatient Medications  Medication Sig Dispense Refill   acetaminophen (TYLENOL) 650 MG CR tablet Take 650 mg by mouth at bedtime.     Bioflavonoid Products (ESTER C PO) Take 500 mg by mouth daily.     docusate sodium (COLACE) 100 MG capsule Take 100 mg by mouth daily.     Ferrous Gluconate-C-Folic Acid (IRON-C PO) Take by mouth.     hydroxychloroquine (PLAQUENIL) 200 MG tablet Take 200 mg by mouth daily.     lisinopril-hydrochlorothiazide (PRINZIDE,ZESTORETIC) 10-12.5 MG per tablet Take 1 tablet by mouth daily with  breakfast.      Multiple Vitamin (MULTIVITAMIN WITH MINERALS) TABS tablet Take 1 tablet by mouth daily. Centrum Silver     Omega-3 350 MG CPDR Take 350 mg by mouth daily.     omeprazole (PRILOSEC) 20 MG capsule Take 20 mg by mouth daily.     predniSONE (DELTASONE) 5 MG tablet Take 5 mg by mouth daily with breakfast.     solifenacin (VESICARE) 10 MG tablet Take 1 tablet (10 mg total) by mouth daily. 90 tablet 3   traMADol (ULTRAM) 50 MG tablet Take 50 mg by mouth daily as needed for pain.     warfarin (COUMADIN) 4 MG tablet  No current facility-administered medications for this visit.    Allergies  Allergen Reactions   Sulfa Antibiotics     Weak and dizziness     REVIEW OF SYSTEMS:   [X]  denotes positive finding, [ ]  denotes negative finding Cardiac  Comments:  Chest pain or chest pressure:    Shortness of breath upon exertion:    Short of breath when lying flat:    Irregular heart rhythm:        Vascular    Pain in calf, thigh, or hip brought on by ambulation:    Pain in feet at night that wakes you up from your sleep:     Blood clot in your veins:    Leg swelling:         Pulmonary    Oxygen at home:    Productive cough:     Wheezing:         Neurologic    Sudden weakness in arms or legs:     Sudden numbness in arms or legs:     Sudden onset of difficulty speaking or slurred speech:    Temporary loss of vision in one eye:     Problems with dizziness:         Gastrointestinal    Blood in stool:     Vomited blood:         Genitourinary    Burning when urinating:     Blood in urine:        Psychiatric    Major depression:         Hematologic    Bleeding problems:    Problems with blood clotting too easily:        Skin    Rashes or ulcers:        Constitutional    Fever or chills:      PHYSICAL EXAMINATION:  Vitals:   11/21/21 1114  BP: 118/62  Pulse: 74  Resp: 20  Temp: 98.7 F (37.1 C)  SpO2: 100%  Weight: 209 lb (94.8 kg)  Height: 5'  8" (1.727 m)     General:  WDWN in NAD; vital signs documented above Gait: Not observed HENT: WNL, normocephalic Pulmonary: normal non-labored breathing , without Rales, rhonchi,  wheezing Cardiac: regular HR Abdomen: soft, NT, no masses Skin: without rashes Extremities: Left foot first and second distal digit ulceration Musculoskeletal: no muscle wasting or atrophy  Neurologic: A&O X 3;  No focal weakness or paresthesias are detected Psychiatric:  The pt has Normal affect.   ASSESSMENT/PLAN::  Jose Bush is a 81 y.o. male with atherosclerosis of native arteries of left lower extremity causing ulceration.  Patient counseled patients with chronic limb threatening ischemia have an annual risk of cardiovascular mortality of 25% and a high risk of amputation.   WIfI score calculated based on clinical exam and non-invasive measurements. 1 / 3 / 0. Clinical stage 3. Moderate risk of amputation. High potential benefit from revascularization.  Recommend the following which can slow the progression of atherosclerosis and reduce the risk of major adverse cardiac / limb events:  Complete cessation from all tobacco products. Blood glucose control with goal A1c < 7%. Blood pressure control with goal blood pressure < 140/90 mmHg. Lipid reduction therapy with goal LDL-C <100 mg/dL (<70 if symptomatic from PAD).  Aspirin 81mg  PO QD.  Atorvastatin 40-80mg  PO QD (or other "high intensity" statin therapy).  Plan left lower extremity angiogram with possible intervention via right common femoral approach in  cath lab 11/24/21. Hold warfarin.

## 2021-11-21 ENCOUNTER — Other Ambulatory Visit: Payer: Self-pay

## 2021-11-21 ENCOUNTER — Ambulatory Visit (INDEPENDENT_AMBULATORY_CARE_PROVIDER_SITE_OTHER)
Admission: RE | Admit: 2021-11-21 | Discharge: 2021-11-21 | Disposition: A | Discharge status: Home / Self Care | Payer: Medicare HMO | Source: Ambulatory Visit | Attending: Vascular Surgery | Admitting: Vascular Surgery

## 2021-11-21 ENCOUNTER — Ambulatory Visit: Payer: Medicare HMO | Admitting: Vascular Surgery

## 2021-11-21 ENCOUNTER — Ambulatory Visit (HOSPITAL_COMMUNITY)
Admission: RE | Admit: 2021-11-21 | Discharge: 2021-11-21 | Disposition: A | Discharge status: Home / Self Care | Payer: Medicare HMO | Source: Ambulatory Visit | Attending: Vascular Surgery | Admitting: Vascular Surgery

## 2021-11-21 VITALS — BP 118/62 | HR 74 | Temp 98.7°F | Resp 20 | Ht 68.0 in | Wt 209.0 lb

## 2021-11-21 DIAGNOSIS — I739 Peripheral vascular disease, unspecified: Secondary | ICD-10-CM

## 2021-11-21 DIAGNOSIS — I70245 Atherosclerosis of native arteries of left leg with ulceration of other part of foot: Secondary | ICD-10-CM

## 2021-11-24 ENCOUNTER — Ambulatory Visit (HOSPITAL_COMMUNITY)
Admission: RE | Admit: 2021-11-24 | Discharge: 2021-11-24 | Disposition: A | Discharge status: Home / Self Care | Payer: Medicare HMO | Attending: Vascular Surgery | Admitting: Vascular Surgery

## 2021-11-24 ENCOUNTER — Other Ambulatory Visit: Payer: Self-pay

## 2021-11-24 ENCOUNTER — Encounter (HOSPITAL_COMMUNITY)
Admission: RE | Disposition: A | Discharge status: Home / Self Care | Payer: Self-pay | Source: Home / Self Care | Attending: Vascular Surgery

## 2021-11-24 ENCOUNTER — Encounter (HOSPITAL_COMMUNITY): Payer: Self-pay | Admitting: Vascular Surgery

## 2021-11-24 DIAGNOSIS — L97529 Non-pressure chronic ulcer of other part of left foot with unspecified severity: Secondary | ICD-10-CM | POA: Diagnosis not present

## 2021-11-24 DIAGNOSIS — I4891 Unspecified atrial fibrillation: Secondary | ICD-10-CM | POA: Insufficient documentation

## 2021-11-24 DIAGNOSIS — Z86711 Personal history of pulmonary embolism: Secondary | ICD-10-CM | POA: Diagnosis not present

## 2021-11-24 DIAGNOSIS — I70245 Atherosclerosis of native arteries of left leg with ulceration of other part of foot: Secondary | ICD-10-CM | POA: Diagnosis not present

## 2021-11-24 DIAGNOSIS — I70249 Atherosclerosis of native arteries of left leg with ulceration of unspecified site: Secondary | ICD-10-CM | POA: Diagnosis not present

## 2021-11-24 DIAGNOSIS — Z8673 Personal history of transient ischemic attack (TIA), and cerebral infarction without residual deficits: Secondary | ICD-10-CM | POA: Diagnosis not present

## 2021-11-24 DIAGNOSIS — Z7982 Long term (current) use of aspirin: Secondary | ICD-10-CM | POA: Diagnosis not present

## 2021-11-24 DIAGNOSIS — M329 Systemic lupus erythematosus, unspecified: Secondary | ICD-10-CM | POA: Insufficient documentation

## 2021-11-24 DIAGNOSIS — I10 Essential (primary) hypertension: Secondary | ICD-10-CM | POA: Insufficient documentation

## 2021-11-24 DIAGNOSIS — Z87891 Personal history of nicotine dependence: Secondary | ICD-10-CM | POA: Diagnosis not present

## 2021-11-24 DIAGNOSIS — Z89511 Acquired absence of right leg below knee: Secondary | ICD-10-CM | POA: Insufficient documentation

## 2021-11-24 DIAGNOSIS — Z79899 Other long term (current) drug therapy: Secondary | ICD-10-CM | POA: Insufficient documentation

## 2021-11-24 HISTORY — PX: ABDOMINAL AORTOGRAM: CATH118222

## 2021-11-24 LAB — POCT I-STAT, CHEM 8
BUN: 13 mg/dL (ref 8–23)
Calcium, Ion: 1.19 mmol/L (ref 1.15–1.40)
Chloride: 100 mmol/L (ref 98–111)
Creatinine, Ser: 0.6 mg/dL — ABNORMAL LOW (ref 0.61–1.24)
Glucose, Bld: 79 mg/dL (ref 70–99)
HCT: 38 % — ABNORMAL LOW (ref 39.0–52.0)
Hemoglobin: 12.9 g/dL — ABNORMAL LOW (ref 13.0–17.0)
Potassium: 4 mmol/L (ref 3.5–5.1)
Sodium: 137 mmol/L (ref 135–145)
TCO2: 29 mmol/L (ref 22–32)

## 2021-11-24 LAB — PROTIME-INR
INR: 1.3 — ABNORMAL HIGH (ref 0.8–1.2)
Prothrombin Time: 15.8 seconds — ABNORMAL HIGH (ref 11.4–15.2)

## 2021-11-24 SURGERY — ABDOMINAL AORTOGRAM
Anesthesia: LOCAL | Laterality: Left

## 2021-11-24 MED ORDER — ASPIRIN EC 81 MG PO TBEC
81.0000 mg | DELAYED_RELEASE_TABLET | Freq: Every day | ORAL | Status: DC
  Administered 2021-11-24: 81 mg via ORAL
  Filled 2021-11-24: qty 1

## 2021-11-24 MED ORDER — LIDOCAINE HCL (PF) 1 % IJ SOLN
INTRAMUSCULAR | Status: AC
  Filled 2021-11-24: qty 30

## 2021-11-24 MED ORDER — MIDAZOLAM HCL 2 MG/2ML IJ SOLN
INTRAMUSCULAR | Status: DC | PRN
  Administered 2021-11-24: 1 mg via INTRAVENOUS

## 2021-11-24 MED ORDER — HEPARIN (PORCINE) IN NACL 1000-0.9 UT/500ML-% IV SOLN
INTRAVENOUS | Status: DC | PRN
  Administered 2021-11-24 (×3): 500 mL

## 2021-11-24 MED ORDER — HEPARIN (PORCINE) IN NACL 1000-0.9 UT/500ML-% IV SOLN
INTRAVENOUS | Status: AC
  Filled 2021-11-24: qty 1000

## 2021-11-24 MED ORDER — SODIUM CHLORIDE 0.9 % WEIGHT BASED INFUSION
1.0000 mL/kg/h | INTRAVENOUS | Status: DC
  Administered 2021-11-24: 1 mL/kg/h via INTRAVENOUS

## 2021-11-24 MED ORDER — IODIXANOL 320 MG/ML IV SOLN
INTRAVENOUS | Status: DC | PRN
  Administered 2021-11-24: 75 mL

## 2021-11-24 MED ORDER — SODIUM CHLORIDE 0.9% FLUSH: 3.0000 mL | INTRAVENOUS | Status: DC | PRN

## 2021-11-24 MED ORDER — SODIUM CHLORIDE 0.9% FLUSH: 3.0000 mL | Freq: Two times a day (BID) | INTRAVENOUS | Status: DC

## 2021-11-24 MED ORDER — SODIUM CHLORIDE 0.9 % IV SOLN: 250.0000 mL | INTRAVENOUS | Status: DC | PRN

## 2021-11-24 MED ORDER — SODIUM CHLORIDE 0.9 % IV SOLN: INTRAVENOUS | Status: DC

## 2021-11-24 MED ORDER — LIDOCAINE HCL (PF) 1 % IJ SOLN
INTRAMUSCULAR | Status: DC | PRN
  Administered 2021-11-24: 15 mL

## 2021-11-24 MED ORDER — ONDANSETRON HCL 4 MG/2ML IJ SOLN: 4.0000 mg | Freq: Four times a day (QID) | INTRAMUSCULAR | Status: DC | PRN

## 2021-11-24 MED ORDER — ACETAMINOPHEN 325 MG PO TABS: 650.0000 mg | ORAL_TABLET | ORAL | Status: DC | PRN

## 2021-11-24 MED ORDER — ATORVASTATIN CALCIUM 10 MG PO TABS
20.0000 mg | ORAL_TABLET | Freq: Every day | ORAL | Status: DC
  Administered 2021-11-24: 20 mg via ORAL
  Filled 2021-11-24 (×3): qty 1

## 2021-11-24 MED ORDER — LABETALOL HCL 5 MG/ML IV SOLN: 10.0000 mg | INTRAVENOUS | Status: DC | PRN

## 2021-11-24 MED ORDER — HYDRALAZINE HCL 20 MG/ML IJ SOLN: 5.0000 mg | INTRAMUSCULAR | Status: DC | PRN

## 2021-11-24 MED ORDER — FENTANYL CITRATE (PF) 100 MCG/2ML IJ SOLN
INTRAMUSCULAR | Status: DC | PRN
  Administered 2021-11-24: 50 ug via INTRAVENOUS

## 2021-11-24 SURGICAL SUPPLY — 11 items
CATH OMNI FLUSH 5F 65CM (CATHETERS) ×1 IMPLANT
DEVICE TORQUE H2O (MISCELLANEOUS) ×1 IMPLANT
GUIDEWIRE ANGLED .035X150CM (WIRE) ×1 IMPLANT
KIT MICROPUNCTURE NIT STIFF (SHEATH) ×1 IMPLANT
KIT PV (KITS) ×2 IMPLANT
SHEATH PINNACLE 5F 10CM (SHEATH) ×1 IMPLANT
SHEATH PROBE COVER 6X72 (BAG) ×1 IMPLANT
SYR MEDRAD MARK V 150ML (SYRINGE) ×1 IMPLANT
TRANSDUCER W/STOPCOCK (MISCELLANEOUS) ×2 IMPLANT
TRAY PV CATH (CUSTOM PROCEDURE TRAY) ×2 IMPLANT
WIRE BENTSON .035X145CM (WIRE) ×1 IMPLANT

## 2021-11-24 NOTE — Progress Notes (Signed)
Dr Lenell Antu made aware of pt's INR, ok to remove sheath, safety maintained

## 2021-11-24 NOTE — Op Note (Signed)
DATE OF SERVICE: 11/24/2021  PATIENT:  Jose Bush  81 y.o. male  PRE-OPERATIVE DIAGNOSIS:  Atherosclerosis of native arteries of left lower extremity causing ulceration  POST-OPERATIVE DIAGNOSIS:  Same  PROCEDURE:   1) US guided right common femoral artery access 2) Aortogram 3) Left lower extremity angiogram with second order cannulation (64mL total contrast) 4) Conscious sedation (26 minutes)  SURGEON:  Yevonne Aline. Stanford Breed, MD  ASSISTANT: none  ANESTHESIA:   local and IV sedation  ESTIMATED BLOOD LOSS: minimal  LOCAL MEDICATIONS USED:  LIDOCAINE   COUNTS: confirmed correct.  PATIENT DISPOSITION:  PACU - hemodynamically stable.   Delay start of Pharmacological VTE agent (>24hrs) due to surgical blood loss or risk of bleeding: no  INDICATION FOR PROCEDURE: Jose Bush is a 81 y.o. male with left leg peripheral arterial disease and ulceration. After careful discussion of risks, benefits, and alternatives the patient was offered angiography. The patient understood and wished to proceed.  OPERATIVE FINDINGS:  Terminal aorta and iliac arteries: Widely patent without flow-limiting stenosis Arteries widely patent Left common iliac artery ectatic  Left lower extremity: Common femoral artery: Widely patent without flow-limiting stenosis  Profunda femoris artery: Widely patent without flow-limiting stenosis  Superficial femoral artery: Widely patent without flow-limiting stenosis Popliteal artery: Widely patent without flow-limiting stenosis Anterior tibial artery: Patent at its origin, occludes mid calf Tibioperoneal trunk: Patent Peroneal artery: Courses to the ankle then arborizes into many small collaterals about the ankle Posterior tibial artery: Patent at its origin, occludes mid calf Pedal circulation: Severely disadvantaged, diminutive arteries limited outflow  DESCRIPTION OF PROCEDURE: After identification of the patient in the pre-operative holding area, the  patient was transferred to the operating room. The patient was positioned supine on the operating room table. Anesthesia was induced. The groins was prepped and draped in standard fashion. A surgical pause was performed confirming correct patient, procedure, and operative location.  The right groin was anesthetized with subcutaneous injection of 1% lidocaine. Using ultrasound guidance, the right common femoral artery was accessed with micropuncture technique. Fluoroscopy was used to confirm cannulation over the femoral head. The 58F sheath was upsized to 89F.   A Benson wire was advanced into the distal aorta. Over the wire an omni flush catheter was advanced to the level of L2. Aortogram was performed - see above for details.  The left common iliac artery was selected with a Glidewire guidewire. The wire was advanced into the common femoral artery. Over the wire the omni flush catheter was advanced into the external iliac artery. Selective angiography was performed - see above for details.   The sheath was left in place to be removed in the recovery area.  Conscious sedation was administered with the use of IV fentanyl and midazolam under continuous physician and nurse monitoring.  Heart rate, blood pressure, and oxygen saturation were continuously monitored.  Total sedation time was 26 minutes  Upon completion of the case instrument and sharps counts were confirmed correct. The patient was transferred to the PACU in good condition. I was present for all portions of the procedure.  PLAN: He may be a candidate for deep vein arterialization.  Encouraged the patient and his wife to review this procedure over the weekend.  We will discuss this in person on Tuesday.  His other options include palliative care or below-knee amputation.  Yevonne Aline. Stanford Breed, MD [facility] Phone Number: [phone] 11/24/2021 9:36 AM

## 2021-11-24 NOTE — Interval H&P Note (Signed)
History and Physical Interval Note:  11/24/2021 9:35 AM  Jose Bush  has presented today for surgery, with the diagnosis of PAD with ulcer.  The various methods of treatment have been discussed with the patient and family. After consideration of risks, benefits and other options for treatment, the patient has consented to  Procedure(s): ABDOMINAL AORTOGRAM (Left) as a surgical intervention.  The patient's history has been reviewed, patient examined, no change in status, stable for surgery.  I have reviewed the patient's chart and labs.  Questions were answered to the patient's satisfaction.     Leonie Douglas

## 2021-11-24 NOTE — Progress Notes (Addendum)
SITE AREA: right groin/femoral  SITE PRIOR TO REMOVAL:  LEVEL 0  PRESSURE APPLIED FOR: approximately 20 minutes  MANUAL: yes  PATIENT STATUS DURING PULL: stable  POST PULL SITE:  LEVEL 0  POST PULL INSTRUCTIONS GIVEN: yes  POST PULL PULSES PRESENT: right popliteal palpable at +1, RBKA noted, right femoral palpable at +2  DRESSING APPLIED: gauze with tegaderm  BEDREST BEGINS @ 1000 COMMENTS:

## 2021-11-27 DIAGNOSIS — I4891 Unspecified atrial fibrillation: Secondary | ICD-10-CM | POA: Diagnosis not present

## 2021-11-27 NOTE — H&P (View-Only) (Signed)
Office Note     CC:  follow up Requesting Provider:  Lanelle Bal, PA-C  HPI: Jose Bush is a 81 y.o. (Apr 12, 1941) male who presents to clinic for evaluation of left first and second toe ulceration.  He has a long history of peripheral vascular disease.  He underwent a right below-knee amputation in June 2019.  He underwent left lower extremity angiography with tibial angioplasty in April 2022 with Dr. Oneida Alar.  Over the past several weeks he has developed ulceration of the first and second toe.  There is redness and discoloration of the second toe.  This is mildly painful to him.  He is minimally ambulatory.  He is able to get around his house with his prosthetic and get to the bathroom, the kitchen, etc.  11/28/21: Patient returns to clinic after angiogram to discuss options.  I reviewed risks / benefits / alternatives of deep vein arteriolization with the patient, his wife, and son. I explained that this is not the standard of care, but this is our only option to avoid an amputation. They would like to proceed with this therapy.   Past Medical History:  Diagnosis Date   Aortic stenosis    Arthritis    Atrial fibrillation (HCC)    BPH (benign prostatic hyperplasia)    Essential hypertension    GERD (gastroesophageal reflux disease)    Heart murmur    History of DVT (deep vein thrombosis)    Positive anticardiolipin   History of hiatal hernia    History of stroke 2010   Lupus ([place])    PAD (peripheral artery disease) ([place])    Right below-knee amputation in 2019   Pulmonary embolism (HCC)    hx of 10 years ago   Shingles    Stroke [facility])    Tinnitus    Wears dentures    Wears glasses     Past Surgical History:  Procedure Laterality Date   ABDOMINAL AORTOGRAM Left 11/24/2021   Procedure: ABDOMINAL AORTOGRAM;  Surgeon: [name], MD;  Location: [place] [facility];  Service: Cardiovascular;  Laterality: Left;   ABDOMINAL AORTOGRAM W/LOWER EXTREMITY N/A 02/17/2018    Procedure: ABDOMINAL AORTOGRAM W/LOWER EXTREMITY;  Surgeon: [name], MD;  Location: [place] [facility];  Service: Cardiovascular;  Laterality: N/A;   ABDOMINAL AORTOGRAM W/LOWER EXTREMITY Left 12/30/2020   Procedure: ABDOMINAL AORTOGRAM W/LOWER EXTREMITY;  Surgeon: [name], MD;  Location: [place] [facility];  Service: Cardiovascular;  Laterality: Left;   AMPUTATION Right 03/03/2018   Procedure: AMPUTATION RIGHT SECOND TOE;  Surgeon: [name], MD;  Location: [place];  Service: Vascular;  Laterality: Right;   AMPUTATION Right 03/18/2018   Procedure: AMPUTATION BELOW KNEE;  Surgeon: [name], MD;  Location: [place];  Service: Vascular;  Laterality: Right;   BELOW KNEE LEG AMPUTATION Right 03/18/2018   bilateral cataract surgery      BIOPSY N/A 10/22/2017   Procedure: BIOPSY;  Surgeon: [name], MD;  [facility];  Service: Gastroenterology;  Laterality: N/A;   CATARACT EXTRACTION Bilateral 09/20/2016   COLONOSCOPY N/A 09/18/2016   Dr. [name]: normal   ESOPHAGOGASTRODUODENOSCOPY N/A 10/22/2017   Dr. [name]: normal duodenum, gastric mucosal atrophy but negative H.pylori (Clotest negative), normal GE junction   GIVENS CAPSULE STUDY N/A 01/27/2018   Procedure: GIVENS CAPSULE STUDY;  Surgeon: [name], MD;  [facility];  Service: Endoscopy;  Laterality: N/A;  7:00am   HERNIA REPAIR  2010/laparoscopic/right   IVC FILTER INSERTION     01/2013    KNEE ARTHROSCOPY Right    2007   MULTIPLE TOOTH EXTRACTIONS     PERIPHERAL VASCULAR BALLOON ANGIOPLASTY  02/17/2018   Procedure: PERIPHERAL VASCULAR BALLOON ANGIOPLASTY;  Surgeon: [name], MD;  Location: [place] [facility];  Service: Cardiovascular;;   PERIPHERAL VASCULAR BALLOON ANGIOPLASTY Left 12/30/2020   Procedure: PERIPHERAL VASCULAR BALLOON ANGIOPLASTY;  Surgeon: [name], MD;  Location: [place] [facility];  Service: Cardiovascular;  Laterality: Left;  PT    TOTAL HIP ARTHROPLASTY Right 05/01/2017   Procedure: RIGHT TOTAL HIP ARTHROPLASTY ANTERIOR APPROACH;  Surgeon: [name], MD;  [facility];  Service: Orthopedics;  Laterality: Right;   TOTAL KNEE ARTHROPLASTY Left 12/13/2014   Procedure: LEFT TOTAL KNEE ARTHROPLASTY;  Surgeon: [name], MD;  [facility];  Service: Orthopedics;  Laterality: Left;   TOTAL KNEE ARTHROPLASTY Right 03/21/2015   Procedure: RIGHT TOTAL KNEE ARTHROPLASTY;  Surgeon: [name], MD;  [facility];  Service: Orthopedics;  Laterality: Right;   TRANSURETHRAL RESECTION OF PROSTATE N/A 04/20/2021   Procedure: TRANSURETHRAL RESECTION OF THE PROSTATE (TURP);  Surgeon: Franchot Gallo, MD;  [facility];  Service: Urology;  Laterality: N/A;  31 MINS    Social History   Socioeconomic History   Marital status: Married    Spouse name: Not on file   Number of children: Not on file   Years of education: Not on file   Highest education level: Not on file  Occupational History   Not on file  Tobacco Use   Smoking status: Former    Packs/day: 0.50    Years: 40.00    Pack years: 20.00    Types: Cigarettes    Quit date: 10/02/1983    Years since quitting: 38.1   Smokeless tobacco: Never  Vaping Use   Vaping Use: Never used  Substance and Sexual Activity   Alcohol use: No   Drug use: No   Sexual activity: Not on file  Other Topics Concern   Not on file  Social History Narrative   Not on file   Social Determinants of Health   Financial Resource Strain: Not on file  Food Insecurity: Not on file  Transportation Needs: Not on file  Physical Activity: Not on file  Stress: Not on file  Social Connections: Not on file  Intimate Partner Violence: Not on file    Family History  Problem Relation Age of Onset   Hypertension Other    Stroke Mother    Stroke Father    Heart failure Sister    CAD Brother        CABG age 62   Colon cancer Neg Hx     Current Outpatient Medications   Medication Sig Dispense Refill   acetaminophen (TYLENOL) 650 MG CR tablet Take 650 mg by mouth in the morning and at bedtime.     Bioflavonoid Products (ESTER C PO) Take 500 mg by mouth daily as needed (immune support/health).     docusate sodium (COLACE) 100 MG capsule Take 100 mg by mouth in the morning.     hydroxychloroquine (PLAQUENIL) 200 MG tablet Take 200 mg by mouth in the morning.     Krill Oil 500 MG CAPS Take 500 mg by mouth in the morning.     lisinopril-hydrochlorothiazide (PRINZIDE,ZESTORETIC) 10-12.5 MG per tablet Take 1 tablet by mouth daily with breakfast.      loratadine (CLARITIN) 10 MG  tablet Take 10 mg by mouth in the morning.     melatonin 5 MG TABS Take 10 mg by mouth at bedtime.     Menthol, Topical Analgesic, (ICY HOT EX) Apply 1 application topically 3 (three) times daily as needed (pain.).     omeprazole (PRILOSEC) 20 MG capsule Take 20 mg by mouth daily.     predniSONE (DELTASONE) 5 MG tablet Take 5 mg by mouth daily with breakfast.     solifenacin (VESICARE) 10 MG tablet Take 1 tablet (10 mg total) by mouth daily. (Patient not taking: Reported on 11/21/2021) 90 tablet 3   traMADol (ULTRAM) 50 MG tablet Take 50 mg by mouth 2 (two) times daily as needed for pain.     warfarin (COUMADIN) 4 MG tablet Take 4 mg by mouth every evening.     No current facility-administered medications for this visit.    Allergies  Allergen Reactions   Sulfa Antibiotics     Weak and dizziness     REVIEW OF SYSTEMS:   [X]  denotes positive finding, [ ]  denotes negative finding Cardiac  Comments:  Chest pain or chest pressure:    Shortness of breath upon exertion:    Short of breath when lying flat:    Irregular heart rhythm:        Vascular    Pain in calf, thigh, or hip brought on by ambulation:    Pain in feet at night that wakes you up from your sleep:     Blood clot in your veins:    Leg swelling:         Pulmonary    Oxygen at home:    Productive cough:      Wheezing:         Neurologic    Sudden weakness in arms or legs:     Sudden numbness in arms or legs:     Sudden onset of difficulty speaking or slurred speech:    Temporary loss of vision in one eye:     Problems with dizziness:         Gastrointestinal    Blood in stool:     Vomited blood:         Genitourinary    Burning when urinating:     Blood in urine:        Psychiatric    Major depression:         Hematologic    Bleeding problems:    Problems with blood clotting too easily:        Skin    Rashes or ulcers:        Constitutional    Fever or chills:      PHYSICAL EXAMINATION:  There were no vitals filed for this visit.    General:  WDWN in NAD; vital signs documented above Gait: Not observed HENT: WNL, normocephalic Pulmonary: normal non-labored breathing , without Rales, rhonchi,  wheezing Cardiac: regular HR Abdomen: soft, NT, no masses Skin: without rashes Extremities: Left foot first and second distal digit ulceration Musculoskeletal: no muscle wasting or atrophy  Neurologic: A&O X 3;  No focal weakness or paresthesias are detected Psychiatric:  The pt has Normal affect.   ASSESSMENT/PLAN::  LEOR DINSE is a 81 y.o. male with atherosclerosis of native arteries of left lower extremity causing ulceration.  Patient counseled patients with chronic limb threatening ischemia have an annual risk of cardiovascular mortality of 25% and a high risk of amputation.   WIfI score  calculated based on clinical exam and non-invasive measurements. 1 / 3 / 0. Clinical stage 3. Moderate risk of amputation. High potential benefit from revascularization.  Recommend the following which can slow the progression of atherosclerosis and reduce the risk of major adverse cardiac / limb events:  Complete cessation from all tobacco products. Blood glucose control with goal A1c < 7%. Blood pressure control with goal blood pressure < 140/90 mmHg. Lipid reduction therapy  with goal LDL-C <100 mg/dL (<70 if symptomatic from PAD).  Aspirin 81mg  PO QD.  Atorvastatin 40-80mg  PO QD (or other "high intensity" statin therapy).  Plan left lower extremity angiogram 12/01/21. Plan right common femoral access and left posterior tibial vein access. Will plan to create an endovascular fistula at the level of the posterior tibial artery and posterior tibial vein with an IVUS guided re-entry catheter. The valves of the posterior tibial vein will then be lysed with an over-the-wire fogarty embolectomy balloon catheter. The fistula will then be made permanent with covered stenting (likely 50mm x 73mm viabahn).  Yevonne Aline. Stanford Breed, MD [facility] Phone Number: [phone] 11/28/2021 4:31 PM

## 2021-11-27 NOTE — H&P (View-Only) (Signed)
Office Note     CC:  follow up Requesting Provider:  Lanelle Bal, PA-C  HPI: Jose Bush is a 81 y.o. (1941-03-24) male who presents to clinic for evaluation of left first and second toe ulceration.  He has a long history of peripheral vascular disease.  He underwent a right below-knee amputation in June 2019.  He underwent left lower extremity angiography with tibial angioplasty in April 2022 with Dr. Oneida Alar.  Over the past several weeks he has developed ulceration of the first and second toe.  There is redness and discoloration of the second toe.  This is mildly painful to him.  He is minimally ambulatory.  He is able to get around his house with his prosthetic and get to the bathroom, the kitchen, etc.  11/28/21: Patient returns to clinic after angiogram to discuss options.  I reviewed risks / benefits / alternatives of deep vein arteriolization with the patient, his wife, and son. I explained that this is not the standard of care, but this is our only option to avoid an amputation. They would like to proceed with this therapy.   Past Medical History:  Diagnosis Date   Aortic stenosis    Arthritis    Atrial fibrillation (HCC)    BPH (benign prostatic hyperplasia)    Essential hypertension    GERD (gastroesophageal reflux disease)    Heart murmur    History of DVT (deep vein thrombosis)    Positive anticardiolipin   History of hiatal hernia    History of stroke 2010   Lupus ([place])    PAD (peripheral artery disease) ([place])    Right below-knee amputation in 2019   Pulmonary embolism (HCC)    hx of 10 years ago   Shingles    Stroke [facility])    Tinnitus    Wears dentures    Wears glasses     Past Surgical History:  Procedure Laterality Date   ABDOMINAL AORTOGRAM Left 11/24/2021   Procedure: ABDOMINAL AORTOGRAM;  Surgeon: [name], MD;  Location: [place] [facility];  Service: Cardiovascular;  Laterality: Left;   ABDOMINAL AORTOGRAM W/LOWER EXTREMITY N/A 02/17/2018    Procedure: ABDOMINAL AORTOGRAM W/LOWER EXTREMITY;  Surgeon: [name], MD;  Location: [place] [facility];  Service: Cardiovascular;  Laterality: N/A;   ABDOMINAL AORTOGRAM W/LOWER EXTREMITY Left 12/30/2020   Procedure: ABDOMINAL AORTOGRAM W/LOWER EXTREMITY;  Surgeon: [name], MD;  Location: [place] [facility];  Service: Cardiovascular;  Laterality: Left;   AMPUTATION Right 03/03/2018   Procedure: AMPUTATION RIGHT SECOND TOE;  Surgeon: [name], MD;  Location: [place];  Service: Vascular;  Laterality: Right;   AMPUTATION Right 03/18/2018   Procedure: AMPUTATION BELOW KNEE;  Surgeon: [name], MD;  Location: [place];  Service: Vascular;  Laterality: Right;   BELOW KNEE LEG AMPUTATION Right 03/18/2018   bilateral cataract surgery      BIOPSY N/A 10/22/2017   Procedure: BIOPSY;  Surgeon: [name], MD;  [facility];  Service: Gastroenterology;  Laterality: N/A;   CATARACT EXTRACTION Bilateral 09/20/2016   COLONOSCOPY N/A 09/18/2016   Dr. [name]: normal   ESOPHAGOGASTRODUODENOSCOPY N/A 10/22/2017   Dr. [name]: normal duodenum, gastric mucosal atrophy but negative H.pylori (Clotest negative), normal GE junction   GIVENS CAPSULE STUDY N/A 01/27/2018   Procedure: GIVENS CAPSULE STUDY;  Surgeon: [name], MD;  [facility];  Service: Endoscopy;  Laterality: N/A;  7:00am   HERNIA REPAIR  2010/laparoscopic/right   IVC FILTER INSERTION     01/2013    KNEE ARTHROSCOPY Right    2007   MULTIPLE TOOTH EXTRACTIONS     PERIPHERAL VASCULAR BALLOON ANGIOPLASTY  02/17/2018   Procedure: PERIPHERAL VASCULAR BALLOON ANGIOPLASTY;  Surgeon: [name], MD;  Location: [place] [facility];  Service: Cardiovascular;;   PERIPHERAL VASCULAR BALLOON ANGIOPLASTY Left 12/30/2020   Procedure: PERIPHERAL VASCULAR BALLOON ANGIOPLASTY;  Surgeon: [name], MD;  Location: [place] [facility];  Service: Cardiovascular;  Laterality: Left;  PT    TOTAL HIP ARTHROPLASTY Right 05/01/2017   Procedure: RIGHT TOTAL HIP ARTHROPLASTY ANTERIOR APPROACH;  Surgeon: [name], MD;  [facility];  Service: Orthopedics;  Laterality: Right;   TOTAL KNEE ARTHROPLASTY Left 12/13/2014   Procedure: LEFT TOTAL KNEE ARTHROPLASTY;  Surgeon: [name], MD;  [facility];  Service: Orthopedics;  Laterality: Left;   TOTAL KNEE ARTHROPLASTY Right 03/21/2015   Procedure: RIGHT TOTAL KNEE ARTHROPLASTY;  Surgeon: [name], MD;  [facility];  Service: Orthopedics;  Laterality: Right;   TRANSURETHRAL RESECTION OF PROSTATE N/A 04/20/2021   Procedure: TRANSURETHRAL RESECTION OF THE PROSTATE (TURP);  Surgeon: Franchot Gallo, MD;  [facility];  Service: Urology;  Laterality: N/A;  76 MINS    Social History   Socioeconomic History   Marital status: Married    Spouse name: Not on file   Number of children: Not on file   Years of education: Not on file   Highest education level: Not on file  Occupational History   Not on file  Tobacco Use   Smoking status: Former    Packs/day: 0.50    Years: 40.00    Pack years: 20.00    Types: Cigarettes    Quit date: 10/02/1983    Years since quitting: 38.1   Smokeless tobacco: Never  Vaping Use   Vaping Use: Never used  Substance and Sexual Activity   Alcohol use: No   Drug use: No   Sexual activity: Not on file  Other Topics Concern   Not on file  Social History Narrative   Not on file   Social Determinants of Health   Financial Resource Strain: Not on file  Food Insecurity: Not on file  Transportation Needs: Not on file  Physical Activity: Not on file  Stress: Not on file  Social Connections: Not on file  Intimate Partner Violence: Not on file    Family History  Problem Relation Age of Onset   Hypertension Other    Stroke Mother    Stroke Father    Heart failure Sister    CAD Brother        CABG age 22   Colon cancer Neg Hx     Current Outpatient Medications   Medication Sig Dispense Refill   acetaminophen (TYLENOL) 650 MG CR tablet Take 650 mg by mouth in the morning and at bedtime.     Bioflavonoid Products (ESTER C PO) Take 500 mg by mouth daily as needed (immune support/health).     docusate sodium (COLACE) 100 MG capsule Take 100 mg by mouth in the morning.     hydroxychloroquine (PLAQUENIL) 200 MG tablet Take 200 mg by mouth in the morning.     Krill Oil 500 MG CAPS Take 500 mg by mouth in the morning.     lisinopril-hydrochlorothiazide (PRINZIDE,ZESTORETIC) 10-12.5 MG per tablet Take 1 tablet by mouth daily with breakfast.      loratadine (CLARITIN) 10 MG  tablet Take 10 mg by mouth in the morning.     melatonin 5 MG TABS Take 10 mg by mouth at bedtime.     Menthol, Topical Analgesic, (ICY HOT EX) Apply 1 application topically 3 (three) times daily as needed (pain.).     omeprazole (PRILOSEC) 20 MG capsule Take 20 mg by mouth daily.     predniSONE (DELTASONE) 5 MG tablet Take 5 mg by mouth daily with breakfast.     solifenacin (VESICARE) 10 MG tablet Take 1 tablet (10 mg total) by mouth daily. (Patient not taking: Reported on 11/21/2021) 90 tablet 3   traMADol (ULTRAM) 50 MG tablet Take 50 mg by mouth 2 (two) times daily as needed for pain.     warfarin (COUMADIN) 4 MG tablet Take 4 mg by mouth every evening.     No current facility-administered medications for this visit.    Allergies  Allergen Reactions   Sulfa Antibiotics     Weak and dizziness     REVIEW OF SYSTEMS:   [X]  denotes positive finding, [ ]  denotes negative finding Cardiac  Comments:  Chest pain or chest pressure:    Shortness of breath upon exertion:    Short of breath when lying flat:    Irregular heart rhythm:        Vascular    Pain in calf, thigh, or hip brought on by ambulation:    Pain in feet at night that wakes you up from your sleep:     Blood clot in your veins:    Leg swelling:         Pulmonary    Oxygen at home:    Productive cough:      Wheezing:         Neurologic    Sudden weakness in arms or legs:     Sudden numbness in arms or legs:     Sudden onset of difficulty speaking or slurred speech:    Temporary loss of vision in one eye:     Problems with dizziness:         Gastrointestinal    Blood in stool:     Vomited blood:         Genitourinary    Burning when urinating:     Blood in urine:        Psychiatric    Major depression:         Hematologic    Bleeding problems:    Problems with blood clotting too easily:        Skin    Rashes or ulcers:        Constitutional    Fever or chills:      PHYSICAL EXAMINATION:  There were no vitals filed for this visit.    General:  WDWN in NAD; vital signs documented above Gait: Not observed HENT: WNL, normocephalic Pulmonary: normal non-labored breathing , without Rales, rhonchi,  wheezing Cardiac: regular HR Abdomen: soft, NT, no masses Skin: without rashes Extremities: Left foot first and second distal digit ulceration Musculoskeletal: no muscle wasting or atrophy  Neurologic: A&O X 3;  No focal weakness or paresthesias are detected Psychiatric:  The pt has Normal affect.   ASSESSMENT/PLAN::  BUNNY SMEDBERG is a 81 y.o. male with atherosclerosis of native arteries of left lower extremity causing ulceration.  Patient counseled patients with chronic limb threatening ischemia have an annual risk of cardiovascular mortality of 25% and a high risk of amputation.   WIfI score  calculated based on clinical exam and non-invasive measurements. 1 / 3 / 0. Clinical stage 3. Moderate risk of amputation. High potential benefit from revascularization.  Recommend the following which can slow the progression of atherosclerosis and reduce the risk of major adverse cardiac / limb events:  Complete cessation from all tobacco products. Blood glucose control with goal A1c < 7%. Blood pressure control with goal blood pressure < 140/90 mmHg. Lipid reduction therapy  with goal LDL-C <100 mg/dL (<70 if symptomatic from PAD).  Aspirin 81mg  PO QD.  Atorvastatin 40-80mg  PO QD (or other "high intensity" statin therapy).  Plan left lower extremity angiogram 12/01/21. Plan right common femoral access and left posterior tibial vein access. Will plan to create an endovascular fistula at the level of the posterior tibial artery and posterior tibial vein with an IVUS guided re-entry catheter. The valves of the posterior tibial vein will then be lysed with an over-the-wire fogarty embolectomy balloon catheter. The fistula will then be made permanent with covered stenting (likely 74mm x 69mm viabahn).  Yevonne Aline. Jose Breed, MD [facility] Phone Number: [phone] 11/28/2021 4:31 PM

## 2021-11-27 NOTE — Progress Notes (Signed)
Office Note     CC:  follow up Requesting Provider:  Lanelle Bal, PA-C  HPI: Jose Bush is a 81 y.o. (Sep 11, 1941) male who presents to clinic for evaluation of left first and second toe ulceration.  He has a long history of peripheral vascular disease.  He underwent a right below-knee amputation in June 2019.  He underwent left lower extremity angiography with tibial angioplasty in April 2022 with Dr. Oneida Alar.  Over the past several weeks he has developed ulceration of the first and second toe.  There is redness and discoloration of the second toe.  This is mildly painful to him.  He is minimally ambulatory.  He is able to get around his house with his prosthetic and get to the bathroom, the kitchen, etc.  11/28/21: Patient returns to clinic after angiogram to discuss options.  I reviewed risks / benefits / alternatives of deep vein arteriolization with the patient, his wife, and son. I explained that this is not the standard of care, but this is our only option to avoid an amputation. They would like to proceed with this therapy.   Past Medical History:  Diagnosis Date   Aortic stenosis    Arthritis    Atrial fibrillation (HCC)    BPH (benign prostatic hyperplasia)    Essential hypertension    GERD (gastroesophageal reflux disease)    Heart murmur    History of DVT (deep vein thrombosis)    Positive anticardiolipin   History of hiatal hernia    History of stroke 2010   Lupus ([place])    PAD (peripheral artery disease) ([place])    Right below-knee amputation in 2019   Pulmonary embolism (HCC)    hx of 10 years ago   Shingles    Stroke [facility])    Tinnitus    Wears dentures    Wears glasses     Past Surgical History:  Procedure Laterality Date   ABDOMINAL AORTOGRAM Left 11/24/2021   Procedure: ABDOMINAL AORTOGRAM;  Surgeon: [name], MD;  Location: [place] [facility];  Service: Cardiovascular;  Laterality: Left;   ABDOMINAL AORTOGRAM W/LOWER EXTREMITY N/A 02/17/2018    Procedure: ABDOMINAL AORTOGRAM W/LOWER EXTREMITY;  Surgeon: [name], MD;  Location: [place] [facility];  Service: Cardiovascular;  Laterality: N/A;   ABDOMINAL AORTOGRAM W/LOWER EXTREMITY Left 12/30/2020   Procedure: ABDOMINAL AORTOGRAM W/LOWER EXTREMITY;  Surgeon: [name], MD;  Location: [place] [facility];  Service: Cardiovascular;  Laterality: Left;   AMPUTATION Right 03/03/2018   Procedure: AMPUTATION RIGHT SECOND TOE;  Surgeon: [name], MD;  Location: [place];  Service: Vascular;  Laterality: Right;   AMPUTATION Right 03/18/2018   Procedure: AMPUTATION BELOW KNEE;  Surgeon: [name], MD;  Location: [place];  Service: Vascular;  Laterality: Right;   BELOW KNEE LEG AMPUTATION Right 03/18/2018   bilateral cataract surgery      BIOPSY N/A 10/22/2017   Procedure: BIOPSY;  Surgeon: [name], MD;  [facility];  Service: Gastroenterology;  Laterality: N/A;   CATARACT EXTRACTION Bilateral 09/20/2016   COLONOSCOPY N/A 09/18/2016   Dr. [name]: normal   ESOPHAGOGASTRODUODENOSCOPY N/A 10/22/2017   Dr. [name]: normal duodenum, gastric mucosal atrophy but negative H.pylori (Clotest negative), normal GE junction   GIVENS CAPSULE STUDY N/A 01/27/2018   Procedure: GIVENS CAPSULE STUDY;  Surgeon: [name], MD;  [facility];  Service: Endoscopy;  Laterality: N/A;  7:00am   HERNIA REPAIR  2010/laparoscopic/right   IVC FILTER INSERTION     01/2013    KNEE ARTHROSCOPY Right    2007   MULTIPLE TOOTH EXTRACTIONS     PERIPHERAL VASCULAR BALLOON ANGIOPLASTY  02/17/2018   Procedure: PERIPHERAL VASCULAR BALLOON ANGIOPLASTY;  Surgeon: [name], MD;  Location: [place] [facility];  Service: Cardiovascular;;   PERIPHERAL VASCULAR BALLOON ANGIOPLASTY Left 12/30/2020   Procedure: PERIPHERAL VASCULAR BALLOON ANGIOPLASTY;  Surgeon: [name], MD;  Location: [place] [facility];  Service: Cardiovascular;  Laterality: Left;  PT    TOTAL HIP ARTHROPLASTY Right 05/01/2017   Procedure: RIGHT TOTAL HIP ARTHROPLASTY ANTERIOR APPROACH;  Surgeon: [name], MD;  [facility];  Service: Orthopedics;  Laterality: Right;   TOTAL KNEE ARTHROPLASTY Left 12/13/2014   Procedure: LEFT TOTAL KNEE ARTHROPLASTY;  Surgeon: [name], MD;  [facility];  Service: Orthopedics;  Laterality: Left;   TOTAL KNEE ARTHROPLASTY Right 03/21/2015   Procedure: RIGHT TOTAL KNEE ARTHROPLASTY;  Surgeon: [name], MD;  [facility];  Service: Orthopedics;  Laterality: Right;   TRANSURETHRAL RESECTION OF PROSTATE N/A 04/20/2021   Procedure: TRANSURETHRAL RESECTION OF THE PROSTATE (TURP);  Surgeon: Franchot Gallo, MD;  [facility];  Service: Urology;  Laterality: N/A;  62 MINS    Social History   Socioeconomic History   Marital status: Married    Spouse name: Not on file   Number of children: Not on file   Years of education: Not on file   Highest education level: Not on file  Occupational History   Not on file  Tobacco Use   Smoking status: Former    Packs/day: 0.50    Years: 40.00    Pack years: 20.00    Types: Cigarettes    Quit date: 10/02/1983    Years since quitting: 38.1   Smokeless tobacco: Never  Vaping Use   Vaping Use: Never used  Substance and Sexual Activity   Alcohol use: No   Drug use: No   Sexual activity: Not on file  Other Topics Concern   Not on file  Social History Narrative   Not on file   Social Determinants of Health   Financial Resource Strain: Not on file  Food Insecurity: Not on file  Transportation Needs: Not on file  Physical Activity: Not on file  Stress: Not on file  Social Connections: Not on file  Intimate Partner Violence: Not on file    Family History  Problem Relation Age of Onset   Hypertension Other    Stroke Mother    Stroke Father    Heart failure Sister    CAD Brother        CABG age 4   Colon cancer Neg Hx     Current Outpatient Medications   Medication Sig Dispense Refill   acetaminophen (TYLENOL) 650 MG CR tablet Take 650 mg by mouth in the morning and at bedtime.     Bioflavonoid Products (ESTER C PO) Take 500 mg by mouth daily as needed (immune support/health).     docusate sodium (COLACE) 100 MG capsule Take 100 mg by mouth in the morning.     hydroxychloroquine (PLAQUENIL) 200 MG tablet Take 200 mg by mouth in the morning.     Krill Oil 500 MG CAPS Take 500 mg by mouth in the morning.     lisinopril-hydrochlorothiazide (PRINZIDE,ZESTORETIC) 10-12.5 MG per tablet Take 1 tablet by mouth daily with breakfast.      loratadine (CLARITIN) 10 MG  tablet Take 10 mg by mouth in the morning.     melatonin 5 MG TABS Take 10 mg by mouth at bedtime.     Menthol, Topical Analgesic, (ICY HOT EX) Apply 1 application topically 3 (three) times daily as needed (pain.).     omeprazole (PRILOSEC) 20 MG capsule Take 20 mg by mouth daily.     predniSONE (DELTASONE) 5 MG tablet Take 5 mg by mouth daily with breakfast.     solifenacin (VESICARE) 10 MG tablet Take 1 tablet (10 mg total) by mouth daily. (Patient not taking: Reported on 11/21/2021) 90 tablet 3   traMADol (ULTRAM) 50 MG tablet Take 50 mg by mouth 2 (two) times daily as needed for pain.     warfarin (COUMADIN) 4 MG tablet Take 4 mg by mouth every evening.     No current facility-administered medications for this visit.    Allergies  Allergen Reactions   Sulfa Antibiotics     Weak and dizziness     REVIEW OF SYSTEMS:   [X]  denotes positive finding, [ ]  denotes negative finding Cardiac  Comments:  Chest pain or chest pressure:    Shortness of breath upon exertion:    Short of breath when lying flat:    Irregular heart rhythm:        Vascular    Pain in calf, thigh, or hip brought on by ambulation:    Pain in feet at night that wakes you up from your sleep:     Blood clot in your veins:    Leg swelling:         Pulmonary    Oxygen at home:    Productive cough:      Wheezing:         Neurologic    Sudden weakness in arms or legs:     Sudden numbness in arms or legs:     Sudden onset of difficulty speaking or slurred speech:    Temporary loss of vision in one eye:     Problems with dizziness:         Gastrointestinal    Blood in stool:     Vomited blood:         Genitourinary    Burning when urinating:     Blood in urine:        Psychiatric    Major depression:         Hematologic    Bleeding problems:    Problems with blood clotting too easily:        Skin    Rashes or ulcers:        Constitutional    Fever or chills:      PHYSICAL EXAMINATION:  There were no vitals filed for this visit.    General:  WDWN in NAD; vital signs documented above Gait: Not observed HENT: WNL, normocephalic Pulmonary: normal non-labored breathing , without Rales, rhonchi,  wheezing Cardiac: regular HR Abdomen: soft, NT, no masses Skin: without rashes Extremities: Left foot first and second distal digit ulceration Musculoskeletal: no muscle wasting or atrophy  Neurologic: A&O X 3;  No focal weakness or paresthesias are detected Psychiatric:  The pt has Normal affect.   ASSESSMENT/PLAN::  ADIEN BACHO is a 81 y.o. male with atherosclerosis of native arteries of left lower extremity causing ulceration.  Patient counseled patients with chronic limb threatening ischemia have an annual risk of cardiovascular mortality of 25% and a high risk of amputation.   WIfI score  calculated based on clinical exam and non-invasive measurements. 1 / 3 / 0. Clinical stage 3. Moderate risk of amputation. High potential benefit from revascularization.  Recommend the following which can slow the progression of atherosclerosis and reduce the risk of major adverse cardiac / limb events:  Complete cessation from all tobacco products. Blood glucose control with goal A1c < 7%. Blood pressure control with goal blood pressure < 140/90 mmHg. Lipid reduction therapy  with goal LDL-C <100 mg/dL (<70 if symptomatic from PAD).  Aspirin 81mg  PO QD.  Atorvastatin 40-80mg  PO QD (or other "high intensity" statin therapy).  Plan left lower extremity angiogram 12/01/21. Plan right common femoral access and left posterior tibial vein access. Will plan to create an endovascular fistula at the level of the posterior tibial artery and posterior tibial vein with an IVUS guided re-entry catheter. The valves of the posterior tibial vein will then be lysed with an over-the-wire fogarty embolectomy balloon catheter. The fistula will then be made permanent with covered stenting (likely 81mm x 30mm viabahn).  Yevonne Aline. Stanford Breed, MD [facility] Phone Number: [phone] 11/28/2021 4:31 PM

## 2021-11-28 ENCOUNTER — Ambulatory Visit (INDEPENDENT_AMBULATORY_CARE_PROVIDER_SITE_OTHER): Payer: Medicare HMO | Admitting: Vascular Surgery

## 2021-11-28 ENCOUNTER — Encounter: Payer: Self-pay | Admitting: Vascular Surgery

## 2021-11-28 ENCOUNTER — Other Ambulatory Visit: Payer: Self-pay

## 2021-11-28 VITALS — BP 138/70 | HR 71 | Temp 98.9°F | Resp 20 | Ht 68.0 in | Wt 200.0 lb

## 2021-11-28 DIAGNOSIS — I70245 Atherosclerosis of native arteries of left leg with ulceration of other part of foot: Secondary | ICD-10-CM | POA: Diagnosis not present

## 2021-11-28 DIAGNOSIS — S32050A Wedge compression fracture of fifth lumbar vertebra, initial encounter for closed fracture: Secondary | ICD-10-CM | POA: Insufficient documentation

## 2021-11-29 ENCOUNTER — Other Ambulatory Visit: Payer: Self-pay

## 2021-12-01 ENCOUNTER — Other Ambulatory Visit: Payer: Self-pay

## 2021-12-01 ENCOUNTER — Encounter (HOSPITAL_COMMUNITY)
Admission: RE | Disposition: A | Discharge status: Home / Self Care | Payer: Self-pay | Source: Home / Self Care | Attending: Vascular Surgery

## 2021-12-01 ENCOUNTER — Ambulatory Visit (HOSPITAL_COMMUNITY)
Admission: RE | Admit: 2021-12-01 | Discharge: 2021-12-01 | Disposition: A | Discharge status: Home / Self Care | Payer: Medicare HMO | Attending: Vascular Surgery | Admitting: Vascular Surgery

## 2021-12-01 DIAGNOSIS — M329 Systemic lupus erythematosus, unspecified: Secondary | ICD-10-CM | POA: Diagnosis not present

## 2021-12-01 DIAGNOSIS — Z87891 Personal history of nicotine dependence: Secondary | ICD-10-CM | POA: Insufficient documentation

## 2021-12-01 DIAGNOSIS — Z8673 Personal history of transient ischemic attack (TIA), and cerebral infarction without residual deficits: Secondary | ICD-10-CM | POA: Insufficient documentation

## 2021-12-01 DIAGNOSIS — Z79899 Other long term (current) drug therapy: Secondary | ICD-10-CM | POA: Diagnosis not present

## 2021-12-01 DIAGNOSIS — L97529 Non-pressure chronic ulcer of other part of left foot with unspecified severity: Secondary | ICD-10-CM | POA: Insufficient documentation

## 2021-12-01 DIAGNOSIS — I350 Nonrheumatic aortic (valve) stenosis: Secondary | ICD-10-CM | POA: Diagnosis not present

## 2021-12-01 DIAGNOSIS — Z89511 Acquired absence of right leg below knee: Secondary | ICD-10-CM | POA: Diagnosis not present

## 2021-12-01 DIAGNOSIS — Z7982 Long term (current) use of aspirin: Secondary | ICD-10-CM | POA: Diagnosis not present

## 2021-12-01 DIAGNOSIS — I70245 Atherosclerosis of native arteries of left leg with ulceration of other part of foot: Secondary | ICD-10-CM | POA: Insufficient documentation

## 2021-12-01 DIAGNOSIS — I10 Essential (primary) hypertension: Secondary | ICD-10-CM | POA: Insufficient documentation

## 2021-12-01 DIAGNOSIS — I4891 Unspecified atrial fibrillation: Secondary | ICD-10-CM | POA: Insufficient documentation

## 2021-12-01 DIAGNOSIS — I70244 Atherosclerosis of native arteries of left leg with ulceration of heel and midfoot: Secondary | ICD-10-CM | POA: Diagnosis not present

## 2021-12-01 HISTORY — PX: PERIPHERAL VASCULAR BALLOON ANGIOPLASTY: CATH118281

## 2021-12-01 LAB — POCT I-STAT, CHEM 8
BUN: 11 mg/dL (ref 8–23)
Calcium, Ion: 1.33 mmol/L (ref 1.15–1.40)
Chloride: 98 mmol/L (ref 98–111)
Creatinine, Ser: 0.5 mg/dL — ABNORMAL LOW (ref 0.61–1.24)
Glucose, Bld: 80 mg/dL (ref 70–99)
HCT: 37 % — ABNORMAL LOW (ref 39.0–52.0)
Hemoglobin: 12.6 g/dL — ABNORMAL LOW (ref 13.0–17.0)
Potassium: 3.6 mmol/L (ref 3.5–5.1)
Sodium: 134 mmol/L — ABNORMAL LOW (ref 135–145)
TCO2: 27 mmol/L (ref 22–32)

## 2021-12-01 LAB — PROTIME-INR
INR: 1.1 (ref 0.8–1.2)
Prothrombin Time: 13.8 seconds (ref 11.4–15.2)

## 2021-12-01 LAB — GLUCOSE, CAPILLARY: Glucose-Capillary: 90 mg/dL (ref 70–99)

## 2021-12-01 SURGERY — PERIPHERAL VASCULAR BALLOON ANGIOPLASTY
Anesthesia: LOCAL | Laterality: Left

## 2021-12-01 MED ORDER — FENTANYL CITRATE (PF) 100 MCG/2ML IJ SOLN
INTRAMUSCULAR | Status: AC
  Filled 2021-12-01: qty 2

## 2021-12-01 MED ORDER — LIDOCAINE HCL (PF) 1 % IJ SOLN
INTRAMUSCULAR | Status: DC | PRN
Start: 2021-12-01 — End: 2021-12-01
  Administered 2021-12-01: 2 mL
  Administered 2021-12-01: 15 mL

## 2021-12-01 MED ORDER — SODIUM CHLORIDE 0.9 % IV SOLN: INTRAVENOUS | Status: DC

## 2021-12-01 MED ORDER — SODIUM CHLORIDE 0.9% FLUSH: 3.0000 mL | Freq: Two times a day (BID) | INTRAVENOUS | Status: DC

## 2021-12-01 MED ORDER — HEPARIN (PORCINE) IN NACL 1000-0.9 UT/500ML-% IV SOLN
INTRAVENOUS | Status: DC | PRN
  Administered 2021-12-01 (×2): 500 mL

## 2021-12-01 MED ORDER — ONDANSETRON HCL 4 MG/2ML IJ SOLN
INTRAMUSCULAR | Status: DC | PRN
  Administered 2021-12-01: 4 mg via INTRAVENOUS

## 2021-12-01 MED ORDER — NITROGLYCERIN 1 MG/10 ML FOR IR/CATH LAB
INTRA_ARTERIAL | Status: DC | PRN
  Administered 2021-12-01: 200 ug

## 2021-12-01 MED ORDER — ACETAMINOPHEN 325 MG PO TABS: 650.0000 mg | ORAL_TABLET | ORAL | Status: DC | PRN

## 2021-12-01 MED ORDER — MIDAZOLAM HCL 2 MG/2ML IJ SOLN
INTRAMUSCULAR | Status: AC
  Filled 2021-12-01: qty 2

## 2021-12-01 MED ORDER — SODIUM CHLORIDE 0.9 % WEIGHT BASED INFUSION: 1.0000 mL/kg/h | INTRAVENOUS | Status: DC

## 2021-12-01 MED ORDER — HYDRALAZINE HCL 20 MG/ML IJ SOLN: 5.0000 mg | INTRAMUSCULAR | Status: DC | PRN

## 2021-12-01 MED ORDER — LIDOCAINE HCL (PF) 1 % IJ SOLN
INTRAMUSCULAR | Status: AC
  Filled 2021-12-01: qty 30

## 2021-12-01 MED ORDER — FENTANYL CITRATE (PF) 100 MCG/2ML IJ SOLN
INTRAMUSCULAR | Status: DC | PRN
  Administered 2021-12-01 (×3): 50 ug via INTRAVENOUS

## 2021-12-01 MED ORDER — HEPARIN SODIUM (PORCINE) 1000 UNIT/ML IJ SOLN
INTRAMUSCULAR | Status: AC
  Filled 2021-12-01: qty 10

## 2021-12-01 MED ORDER — ONDANSETRON HCL 4 MG/2ML IJ SOLN: 4.0000 mg | Freq: Four times a day (QID) | INTRAMUSCULAR | Status: DC | PRN

## 2021-12-01 MED ORDER — HEPARIN SODIUM (PORCINE) 1000 UNIT/ML IJ SOLN
INTRAMUSCULAR | Status: DC | PRN
  Administered 2021-12-01: 10000 [IU] via INTRAVENOUS

## 2021-12-01 MED ORDER — NITROGLYCERIN 1 MG/10 ML FOR IR/CATH LAB
INTRA_ARTERIAL | Status: AC
  Filled 2021-12-01: qty 10

## 2021-12-01 MED ORDER — HEPARIN (PORCINE) IN NACL 1000-0.9 UT/500ML-% IV SOLN
INTRAVENOUS | Status: AC
  Filled 2021-12-01: qty 1000

## 2021-12-01 MED ORDER — LABETALOL HCL 5 MG/ML IV SOLN: 10.0000 mg | INTRAVENOUS | Status: DC | PRN

## 2021-12-01 MED ORDER — SODIUM CHLORIDE 0.9% FLUSH: 3.0000 mL | INTRAVENOUS | Status: DC | PRN

## 2021-12-01 MED ORDER — ONDANSETRON HCL 4 MG/2ML IJ SOLN
INTRAMUSCULAR | Status: AC
  Filled 2021-12-01: qty 2

## 2021-12-01 MED ORDER — SODIUM CHLORIDE 0.9 % IV SOLN: 250.0000 mL | INTRAVENOUS | Status: DC | PRN

## 2021-12-01 MED ORDER — MIDAZOLAM HCL 2 MG/2ML IJ SOLN
INTRAMUSCULAR | Status: DC | PRN
  Administered 2021-12-01 (×2): 1 mg via INTRAVENOUS

## 2021-12-01 SURGICAL SUPPLY — 37 items
BALLN COYOTE ES OTW 4X40X145 (BALLOONS) ×3
BALLN COYOTE OTW 3X150X150 (BALLOONS) ×3
BALLN COYOTE OTW 3X80X150 (BALLOONS) ×3
BALLN STERLING OTW 5X40X135 (BALLOONS) ×3
BALLOON COYOTE ES OTW 4X40X145 (BALLOONS) ×1 IMPLANT
BALLOON COYOTE OTW 3X150X150 (BALLOONS) ×1 IMPLANT
BALLOON COYOTE OTW 3X80X150 (BALLOONS) ×1 IMPLANT
BALLOON STERLING OTW 5X40X135 (BALLOONS) ×1 IMPLANT
BNDG HMST LF TOP THROMBIX (HEMOSTASIS) ×2
CATH ANGIO 5F BER2 100CM (CATHETERS) ×2 IMPLANT
CATH CXI 2.3F 150 ANG (CATHETERS) ×2 IMPLANT
CATH OMNI FLUSH 5F 65CM (CATHETERS) ×2 IMPLANT
CATH PIONEER 6X120 (CATHETERS) ×2 IMPLANT
CATH QUICKCROSS SUPP .035X90CM (MICROCATHETER) ×2 IMPLANT
DEVICE CLOSURE MYNXGRIP 6/7F (Vascular Products) ×2 IMPLANT
DEVICE ONE SNARE 10MM (MISCELLANEOUS) ×2 IMPLANT
GLIDEWIRE ADV .035X260CM (WIRE) ×2 IMPLANT
GUIDEWIRE ANGLED .035X150CM (WIRE) ×2 IMPLANT
KIT ENCORE 26 ADVANTAGE (KITS) ×2 IMPLANT
KIT MICROPUNCTURE NIT STIFF (SHEATH) ×2 IMPLANT
KIT PV (KITS) ×3 IMPLANT
PATCH THROMBIX TOPICAL PLAIN (HEMOSTASIS) ×2 IMPLANT
SHEATH MICROPUNCTURE PEDAL 4FR (SHEATH) ×2 IMPLANT
SHEATH PINNACLE 5F 10CM (SHEATH) ×4 IMPLANT
SHEATH PINNACLE 6F 10CM (SHEATH) ×4 IMPLANT
SHEATH PROBE COVER 6X72 (BAG) ×2 IMPLANT
SHEATH SHUTTLE SELECT 6F (SHEATH) ×2 IMPLANT
TRANSDUCER W/STOPCOCK (MISCELLANEOUS) ×3 IMPLANT
TRAY PV CATH (CUSTOM PROCEDURE TRAY) ×3 IMPLANT
WIRE APROACH 25G .014X300CM (WIRE) ×2 IMPLANT
WIRE BENTSON .035X145CM (WIRE) ×2 IMPLANT
WIRE G V18X300CM (WIRE) ×2 IMPLANT
WIRE SHEPHERD 12G .014 (WIRE) ×2 IMPLANT
WIRE SHEPHERD 6G .014 (WIRE) ×2 IMPLANT
WIRE SPARTACORE .014X300CM (WIRE) ×8 IMPLANT
WIRE STARTER ROSEN .035X260 (WIRE) ×2 IMPLANT
WIRE TORQFLEX AUST .018X40CM (WIRE) ×4 IMPLANT

## 2021-12-01 NOTE — Op Note (Signed)
DATE OF SERVICE: 12/01/2021 ? ?PATIENT:  Jose Bush  81 y.o. male ? ?PRE-OPERATIVE DIAGNOSIS:  Atherosclerosis of native arteries of left lower extremity causing ulceration ? ?POST-OPERATIVE DIAGNOSIS:  Same ? ?PROCEDURE:   ?1) US guided right common femoral artery access ?2) Left lower extremity angiogram with third order cannulation (58m total contrast) ?3) Left posterior tibial artery angioplasty (3x159mCoyote) ?4) Left plantar arch angioplasty ([id number]) ?5) US [place]ided left posterior tibial vein access ?6) Intravascular ultrasound of left posterior tibial artery and vein ?7) Left posterior tibial vein angioplasty (5x80m61myote) ?8) Conscious sedation (148 minutes) ? ?SURGEON:  ThomYevonne AlinewkStanford Bush ? ?ASSISTANT: ChriMarty Heck ? ?ANESTHESIA:   local and IV sedation ? ?ESTIMATED BLOOD LOSS: minimal ? ?LOCAL MEDICATIONS USED:  LIDOCAINE  ? ?COUNTS: confirmed correct. ? ?PATIENT DISPOSITION:  PACU - hemodynamically stable. ?  ?Delay start of Pharmacological VTE agent (>24hrs) due to surgical blood loss or risk of bleeding: no ? ?INDICATION FOR PROCEDURE: Jose DYKEMAa 80 y81. male with left foot ulcer with severely disadvantaged pedal flow on recent angiogram. After careful discussion of risks, benefits, and alternatives the patient was offered deep vein arterialization. The patient understood and wished to proceed. ? ?OPERATIVE FINDINGS:  ?Ultimately unsuccessful attempt at endovascular creation of posterior tibial fistula to create a deep vein arterialization.  We were able to redemonstrate a normal tibial trifurcation with severe distal tibial and pedal disease.  I was able to recanalize the posterior tibial artery to the hindfoot.  Percutaneous access of the posterior tibial vein in the midfoot was then performed using ultrasound guidance.  We were able to advance a wire to the takeoff of the posterior tibial vein in the calf.  Intravascular ultrasound was used to visualize the 2 lm  of the posterior tibial artery and vein.  We attempted to create a fistula between the 2 using a Pioneer reentry device.  The heavily calcified posterior tibial artery would not permit the needle to traverse the artery into the vein.  After many hours of effort, we aborted the procedure. ? ?DESCRIPTION OF PROCEDURE: After identification of the patient in the pre-operative holding area, the patient was transferred to the operating room. The patient was positioned supine on the operating room table. Anesthesia was induced. The groins was prepped and draped in standard fashion. A surgical pause was performed confirming correct patient, procedure, and operative location. ? ?The right groin was anesthetized with subcutaneous injection of 1% lidocaine. Using ultrasound guidance, the right common femoral artery was accessed with micropuncture technique. Fluoroscopy was used to confirm cannulation over the femoral head. The 76F sheath was upsized to 75F.  ? ?A Glidewire was advanced into the distal aorta. Over the wire an omni flush catheter was advanced to the level of L2. The left common iliac artery was selected with a Glidewire guidewire. The wire was advanced into the popliteal artery. Over the wire the omni flush catheter was advanced into superficial femoral artery.  Rosen wire was delivered in the popliteal artery.  We exchanged our sheath for a 6 French by 90 cm sheath.  Selective left lower extremity angiography was performed - see above for details.  ? ?The decision was made to intervene. The patient was heparinized with 10,000 units of heparin. Selective angiography of the left lower extremity was performed prior to intervention.  ? ?I was able to navigate a 014 [place] wire into the midfoot using a crossing catheter to support our  platform.  The wire would not enter the pedal arch.  I performed pedal and distal posterior tibial angioplasty with a 3 x 150 mm coyote balloon.  Good result was noted.  Dismal outflow  was noted into the mid and distal foot.  I felt it necessary to continue with attempts at deep venous arterialization given his severely disadvantaged pedal flow and ischemic ulcer on the toe. ? ?Ultrasound guidance was used to obtain percutaneous access of the left posterior tibial vein at the midfoot.  The vein was accessed with a micropuncture kit and upsized to a micro sheath.  A venogram was performed to confirm intravenous position.  A Bentson wire was navigated into the posterior tibial vein.  We upsized our access to 6 [place].  We had difficulty navigating endovascular equipment into the central posterior tibial vein.  Serial venoplasty was then performed up to a 5 x 80 mm coyote balloon. ? ?A Pioneer reentry catheter was delivered over the wire through the venous access.  Intravascular ultrasound was performed of the posterior tibial vein.  We tried multiple times to create a fistula between the posterior tibial vein and the posterior tibial artery without success.  The posterior tibial artery was heavily calcified. ? ?We then introduced the Pioneer catheter from the arterial access and reattempted creating a fistula between the severe tibial artery and vein.  We could not create a fistula despite multiple adjunctive maneuvers including inflating a angioplasty balloon in the posterior tibial vein. ? ?We elected to end the procedure here.  All endovascular equipment was removed.  A minx device was used to close the arteriotomy in the right common femoral artery.  This was hemostatic upon completion.  Manual pressure was used to close the left foot venous access.  This was hemostatic upon completion. bandages were applied. ? ?Upon completion of the case instrument and sharps counts were confirmed correct. The patient was transferred to the PACU in good condition. I was present for all portions of the procedure. ? ?PLAN: No good options.  Continue best medical therapy.  Open deep venous arterialization is  still an option.  I will discuss this with his family. ? ?Jose Bush. Jose Breed, MD ?[facility] [place] ?Office Phone Number: [phone] ?12/01/2021 10:35 AM ? ?

## 2021-12-01 NOTE — Progress Notes (Signed)
Jose Antu, MD stated that patient will be having surgery on Monday and patient needs to hold coumadin until further notice. Patient and Jose Bush (wife) made aware.  ?

## 2021-12-01 NOTE — Interval H&P Note (Signed)
History and Physical Interval Note: ? ?12/01/2021 ?10:34 AM ? ?Clearnce Hasten  has presented today for surgery, with the diagnosis of ulcer of toe / left foot.  The various methods of treatment have been discussed with the patient and family. After consideration of risks, benefits and other options for treatment, the patient has consented to  Procedure(s): ?ABDOMINAL AORTOGRAM W/LOWER EXTREMITY (N/A) as a surgical intervention.  The patient's history has been reviewed, patient examined, no change in status, stable for surgery.  I have reviewed the patient's chart and labs.  Questions were answered to the patient's satisfaction.   ? ? ?Jose Bush ? ? ?

## 2021-12-04 ENCOUNTER — Other Ambulatory Visit: Payer: Self-pay

## 2021-12-04 ENCOUNTER — Encounter (HOSPITAL_COMMUNITY): Payer: Self-pay | Admitting: Vascular Surgery

## 2021-12-04 DIAGNOSIS — L97509 Non-pressure chronic ulcer of other part of unspecified foot with unspecified severity: Secondary | ICD-10-CM | POA: Diagnosis not present

## 2021-12-04 DIAGNOSIS — I70249 Atherosclerosis of native arteries of left leg with ulceration of unspecified site: Secondary | ICD-10-CM

## 2021-12-04 DIAGNOSIS — I739 Peripheral vascular disease, unspecified: Secondary | ICD-10-CM | POA: Diagnosis not present

## 2021-12-04 DIAGNOSIS — M329 Systemic lupus erythematosus, unspecified: Secondary | ICD-10-CM | POA: Diagnosis not present

## 2021-12-04 DIAGNOSIS — I4891 Unspecified atrial fibrillation: Secondary | ICD-10-CM | POA: Diagnosis not present

## 2021-12-05 ENCOUNTER — Encounter (HOSPITAL_COMMUNITY): Payer: Self-pay | Admitting: Vascular Surgery

## 2021-12-05 ENCOUNTER — Other Ambulatory Visit: Payer: Self-pay

## 2021-12-05 NOTE — Progress Notes (Signed)
PCP - Lianne Moris, PA-C ?Cardiologist - Dr. Diona Browner ?EKG - 11/24/21 ?Chest x-ray -  ?ECHO - 05/31/20 ?Cardiac Cath -  ?CPAP -  ? ?Blood Thinner Instructions: per wife last dose 3/2 (pt has been off for 5 days)  ?Aspirin Instructions:  ?ERAS Protcol - n/a ?COVID TEST- DOS ? ?Anesthesia review: yes ? ?------------- ? ?SDW INSTRUCTIONS: ? ?Your procedure is scheduled on wed 3/8. Please report to [facility] Main Entrance "A" at 0630 A.M., and check in at the Admitting office. Call this number if you have problems the morning of surgery: [phone] ? ? ?Remember: Do not eat or drink after midnight the night before your surgery ?  ?Medications to take morning of surgery with a sip of water include: ?acetaminophen (TYLENOL)  - if needed ?hydroxychloroquine (PLAQUENIL) ?loratadine (CLARITIN)  ?omeprazole (PRILOSEC)  ?predniSONE (DELTASONE)  ? ?Follow your surgeon's instructions on when to stop warfarin (COUMADIN)  If no instructions were given by your surgeon then you will need to call the office to get those instructions.   ? ?As of today, STOP taking any Aspirin (unless otherwise instructed by your surgeon), Aleve, Naproxen, Ibuprofen, Motrin, Advil, Goody's, BC's, all herbal medications, fish oil, and all vitamins. ? ?  ?The Morning of Surgery ?Do not wear jewelry ?Do not wear lotions, powders, or perfumes or deodorant ?Do not bring valuables to the hospital. ?[facility] is not responsible for any belongings or valuables. ? ?If you are a smoker, DO NOT Smoke 24 hours prior to surgery ? ?If you wear a CPAP at night please bring your mask the morning of surgery  ? ?Remember that you must have someone to transport you home after your surgery, and remain with you for 24 hours if you are discharged the same day. ? ?Please bring cases for contacts, glasses, hearing aids, dentures or bridgework because it cannot be worn into surgery.  ? ?Patients discharged the day of surgery will not be allowed to drive home.  ? ?Please  shower the NIGHT BEFORE/MORNING OF SURGERY (use antibacterial soap like DIAL soap if possible). Wear comfortable clothes the morning of surgery. Oral Hygiene is also important to reduce your risk of infection.  Remember - BRUSH YOUR TEETH THE MORNING OF SURGERY WITH YOUR REGULAR TOOTHPASTE ? ?Patient denies shortness of breath, fever, cough and chest pain.  ? ? ?   ? ?

## 2021-12-06 ENCOUNTER — Other Ambulatory Visit: Payer: Self-pay

## 2021-12-06 ENCOUNTER — Inpatient Hospital Stay (HOSPITAL_COMMUNITY): Payer: Medicare HMO | Admitting: Anesthesiology

## 2021-12-06 ENCOUNTER — Inpatient Hospital Stay (HOSPITAL_COMMUNITY): Payer: Medicare HMO

## 2021-12-06 ENCOUNTER — Encounter (HOSPITAL_COMMUNITY)
Admission: RE | Disposition: A | Discharge status: Hospice | Payer: Self-pay | Source: Home / Self Care | Attending: Vascular Surgery

## 2021-12-06 ENCOUNTER — Encounter (HOSPITAL_COMMUNITY): Payer: Self-pay | Admitting: Vascular Surgery

## 2021-12-06 ENCOUNTER — Inpatient Hospital Stay (HOSPITAL_COMMUNITY)
Admission: RE | Admit: 2021-12-06 | Discharge: 2021-12-20 | DRG: 253 | Disposition: A | Discharge status: Hospice | Payer: Medicare HMO | Attending: Vascular Surgery | Admitting: Vascular Surgery

## 2021-12-06 DIAGNOSIS — Z8673 Personal history of transient ischemic attack (TIA), and cerebral infarction without residual deficits: Secondary | ICD-10-CM

## 2021-12-06 DIAGNOSIS — I10 Essential (primary) hypertension: Secondary | ICD-10-CM | POA: Diagnosis present

## 2021-12-06 DIAGNOSIS — E669 Obesity, unspecified: Secondary | ICD-10-CM | POA: Diagnosis present

## 2021-12-06 DIAGNOSIS — L97529 Non-pressure chronic ulcer of other part of left foot with unspecified severity: Secondary | ICD-10-CM | POA: Diagnosis present

## 2021-12-06 DIAGNOSIS — Z8249 Family history of ischemic heart disease and other diseases of the circulatory system: Secondary | ICD-10-CM | POA: Diagnosis not present

## 2021-12-06 DIAGNOSIS — I70248 Atherosclerosis of native arteries of left leg with ulceration of other part of lower left leg: Secondary | ICD-10-CM | POA: Diagnosis not present

## 2021-12-06 DIAGNOSIS — N400 Enlarged prostate without lower urinary tract symptoms: Secondary | ICD-10-CM | POA: Diagnosis present

## 2021-12-06 DIAGNOSIS — T8131XA Disruption of external operation (surgical) wound, not elsewhere classified, initial encounter: Secondary | ICD-10-CM | POA: Diagnosis not present

## 2021-12-06 DIAGNOSIS — Z7189 Other specified counseling: Secondary | ICD-10-CM | POA: Diagnosis not present

## 2021-12-06 DIAGNOSIS — T82838A Hemorrhage of vascular prosthetic devices, implants and grafts, initial encounter: Secondary | ICD-10-CM | POA: Diagnosis not present

## 2021-12-06 DIAGNOSIS — Z86718 Personal history of other venous thrombosis and embolism: Secondary | ICD-10-CM

## 2021-12-06 DIAGNOSIS — Z7901 Long term (current) use of anticoagulants: Secondary | ICD-10-CM

## 2021-12-06 DIAGNOSIS — Z89511 Acquired absence of right leg below knee: Secondary | ICD-10-CM | POA: Diagnosis not present

## 2021-12-06 DIAGNOSIS — I70245 Atherosclerosis of native arteries of left leg with ulceration of other part of foot: Secondary | ICD-10-CM | POA: Diagnosis not present

## 2021-12-06 DIAGNOSIS — I70262 Atherosclerosis of native arteries of extremities with gangrene, left leg: Principal | ICD-10-CM | POA: Diagnosis present

## 2021-12-06 DIAGNOSIS — Z7952 Long term (current) use of systemic steroids: Secondary | ICD-10-CM | POA: Diagnosis not present

## 2021-12-06 DIAGNOSIS — I6990 Unspecified sequelae of unspecified cerebrovascular disease: Secondary | ICD-10-CM | POA: Diagnosis not present

## 2021-12-06 DIAGNOSIS — Z96653 Presence of artificial knee joint, bilateral: Secondary | ICD-10-CM | POA: Diagnosis present

## 2021-12-06 DIAGNOSIS — Y838 Other surgical procedures as the cause of abnormal reaction of the patient, or of later complication, without mention of misadventure at the time of the procedure: Secondary | ICD-10-CM | POA: Diagnosis not present

## 2021-12-06 DIAGNOSIS — Z66 Do not resuscitate: Secondary | ICD-10-CM | POA: Diagnosis not present

## 2021-12-06 DIAGNOSIS — I4891 Unspecified atrial fibrillation: Secondary | ICD-10-CM | POA: Diagnosis present

## 2021-12-06 DIAGNOSIS — D649 Anemia, unspecified: Secondary | ICD-10-CM | POA: Diagnosis not present

## 2021-12-06 DIAGNOSIS — D62 Acute posthemorrhagic anemia: Secondary | ICD-10-CM | POA: Diagnosis not present

## 2021-12-06 DIAGNOSIS — M1990 Unspecified osteoarthritis, unspecified site: Secondary | ICD-10-CM | POA: Diagnosis present

## 2021-12-06 DIAGNOSIS — Z20822 Contact with and (suspected) exposure to covid-19: Secondary | ICD-10-CM | POA: Diagnosis present

## 2021-12-06 DIAGNOSIS — Z86711 Personal history of pulmonary embolism: Secondary | ICD-10-CM

## 2021-12-06 DIAGNOSIS — K449 Diaphragmatic hernia without obstruction or gangrene: Secondary | ICD-10-CM | POA: Diagnosis not present

## 2021-12-06 DIAGNOSIS — K219 Gastro-esophageal reflux disease without esophagitis: Secondary | ICD-10-CM | POA: Diagnosis present

## 2021-12-06 DIAGNOSIS — I70249 Atherosclerosis of native arteries of left leg with ulceration of unspecified site: Secondary | ICD-10-CM | POA: Diagnosis not present

## 2021-12-06 DIAGNOSIS — I70229 Atherosclerosis of native arteries of extremities with rest pain, unspecified extremity: Principal | ICD-10-CM | POA: Diagnosis present

## 2021-12-06 DIAGNOSIS — Z881 Allergy status to other antibiotic agents status: Secondary | ICD-10-CM | POA: Diagnosis not present

## 2021-12-06 DIAGNOSIS — I998 Other disorder of circulatory system: Secondary | ICD-10-CM | POA: Diagnosis not present

## 2021-12-06 DIAGNOSIS — I739 Peripheral vascular disease, unspecified: Secondary | ICD-10-CM | POA: Diagnosis not present

## 2021-12-06 DIAGNOSIS — Z793 Long term (current) use of hormonal contraceptives: Secondary | ICD-10-CM

## 2021-12-06 DIAGNOSIS — Z6833 Body mass index (BMI) 33.0-33.9, adult: Secondary | ICD-10-CM

## 2021-12-06 DIAGNOSIS — Z515 Encounter for palliative care: Secondary | ICD-10-CM

## 2021-12-06 DIAGNOSIS — Z87891 Personal history of nicotine dependence: Secondary | ICD-10-CM | POA: Diagnosis not present

## 2021-12-06 DIAGNOSIS — Z823 Family history of stroke: Secondary | ICD-10-CM | POA: Diagnosis not present

## 2021-12-06 DIAGNOSIS — M79605 Pain in left leg: Secondary | ICD-10-CM | POA: Diagnosis not present

## 2021-12-06 DIAGNOSIS — Z79899 Other long term (current) drug therapy: Secondary | ICD-10-CM

## 2021-12-06 HISTORY — PX: MINOR AMPUTATION OF DIGIT: SHX6242

## 2021-12-06 HISTORY — PX: BYPASS GRAFT POPLITEAL TO TIBIAL: SHX5764

## 2021-12-06 LAB — POCT I-STAT 7, (LYTES, BLD GAS, ICA,H+H)
Acid-Base Excess: 1 mmol/L (ref 0.0–2.0)
Bicarbonate: 27.5 mmol/L (ref 20.0–28.0)
Calcium, Ion: 1.25 mmol/L (ref 1.15–1.40)
HCT: 31 % — ABNORMAL LOW (ref 39.0–52.0)
Hemoglobin: 10.5 g/dL — ABNORMAL LOW (ref 13.0–17.0)
O2 Saturation: 100 %
Patient temperature: 36.5
Potassium: 4.1 mmol/L (ref 3.5–5.1)
Sodium: 139 mmol/L (ref 135–145)
TCO2: 29 mmol/L (ref 22–32)
pCO2 arterial: 52.1 mmHg — ABNORMAL HIGH (ref 32–48)
pH, Arterial: 7.328 — ABNORMAL LOW (ref 7.35–7.45)
pO2, Arterial: 223 mmHg — ABNORMAL HIGH (ref 83–108)

## 2021-12-06 LAB — COMPREHENSIVE METABOLIC PANEL
ALT: 25 U/L (ref 0–44)
AST: 25 U/L (ref 15–41)
Albumin: 3.3 g/dL — ABNORMAL LOW (ref 3.5–5.0)
Alkaline Phosphatase: 54 U/L (ref 38–126)
Anion gap: 9 (ref 5–15)
BUN: 10 mg/dL (ref 8–23)
CO2: 26 mmol/L (ref 22–32)
Calcium: 8.9 mg/dL (ref 8.9–10.3)
Chloride: 102 mmol/L (ref 98–111)
Creatinine, Ser: 0.62 mg/dL (ref 0.61–1.24)
GFR, Estimated: >60 mL/min (ref >60)
Glucose, Bld: 87 mg/dL (ref 70–99)
Potassium: 3.2 mmol/L — ABNORMAL LOW (ref 3.5–5.1)
Sodium: 137 mmol/L (ref 135–145)
Total Bilirubin: 0.4 mg/dL (ref 0.3–1.2)
Total Protein: 5.8 g/dL — ABNORMAL LOW (ref 6.5–8.1)

## 2021-12-06 LAB — POCT ACTIVATED CLOTTING TIME
Activated Clotting Time: 245 seconds
Activated Clotting Time: 263 seconds
Activated Clotting Time: 221 seconds
Activated Clotting Time: 251 seconds
Activated Clotting Time: 221 seconds
Activated Clotting Time: 221 seconds

## 2021-12-06 LAB — CBC
HCT: 33.6 % — ABNORMAL LOW (ref 39.0–52.0)
Hemoglobin: 10.6 g/dL — ABNORMAL LOW (ref 13.0–17.0)
MCH: 27.6 pg (ref 26.0–34.0)
MCHC: 31.5 g/dL (ref 30.0–36.0)
MCV: 87.5 fL (ref 80.0–100.0)
Platelets: 218 10*3/uL (ref 150–400)
RBC: 3.84 MIL/uL — ABNORMAL LOW (ref 4.22–5.81)
RDW: 15.4 % (ref 11.5–15.5)
WBC: 6.7 10*3/uL (ref 4.0–10.5)
nRBC: 0 % (ref 0.0–0.2)

## 2021-12-06 LAB — TYPE AND SCREEN
ABO/RH(D): O POS
Antibody Screen: NEGATIVE

## 2021-12-06 LAB — URINALYSIS, ROUTINE W REFLEX MICROSCOPIC
Bilirubin Urine: NEGATIVE
Glucose, UA: NEGATIVE mg/dL
Hgb urine dipstick: NEGATIVE
Ketones, ur: NEGATIVE mg/dL
Leukocytes,Ua: NEGATIVE
Nitrite: NEGATIVE
Protein, ur: NEGATIVE mg/dL
Specific Gravity, Urine: 1.004 — ABNORMAL LOW (ref 1.005–1.030)
pH: 8 (ref 5.0–8.0)

## 2021-12-06 LAB — SARS CORONAVIRUS 2 BY RT PCR (HOSPITAL ORDER, PERFORMED IN ~~LOC~~ HOSPITAL LAB): SARS Coronavirus 2: NEGATIVE

## 2021-12-06 LAB — PROTIME-INR
INR: 1.3 — ABNORMAL HIGH (ref 0.8–1.2)
Prothrombin Time: 15.8 seconds — ABNORMAL HIGH (ref 11.4–15.2)

## 2021-12-06 LAB — SURGICAL PCR SCREEN
MRSA, PCR: NEGATIVE
Staphylococcus aureus: NEGATIVE

## 2021-12-06 LAB — APTT: aPTT: 29 seconds (ref 24–36)

## 2021-12-06 SURGERY — CREATION, BYPASS, ARTERIAL, POPLITEAL TO TIBIAL, USING GRAFT
Anesthesia: General | Site: Leg Lower | Laterality: Left

## 2021-12-06 MED ORDER — IODIXANOL 320 MG/ML IV SOLN
INTRAVENOUS | Status: DC | PRN
  Administered 2021-12-06: 20 mL

## 2021-12-06 MED ORDER — METOPROLOL TARTRATE 5 MG/5ML IV SOLN: 2.0000 mg | INTRAVENOUS | Status: DC | PRN

## 2021-12-06 MED ORDER — PROTAMINE SULFATE 10 MG/ML IV SOLN
INTRAVENOUS | Status: DC | PRN
  Administered 2021-12-06: 50 mg via INTRAVENOUS

## 2021-12-06 MED ORDER — HEPARIN (PORCINE) 25000 UT/250ML-% IV SOLN
1850.0000 [IU]/h | INTRAVENOUS | Status: DC
  Administered 2021-12-06: 18:00:00 500 [IU]/h via INTRAVENOUS
  Administered 2021-12-08: 1450 [IU]/h via INTRAVENOUS
  Administered 2021-12-08: 04:00:00 1300 [IU]/h via INTRAVENOUS
  Filled 2021-12-06 (×3): qty 250

## 2021-12-06 MED ORDER — CLEVIDIPINE BUTYRATE 0.5 MG/ML IV EMUL
INTRAVENOUS | Status: DC | PRN
  Administered 2021-12-06: 5 mg/h via INTRAVENOUS

## 2021-12-06 MED ORDER — HEPARIN SODIUM (PORCINE) 1000 UNIT/ML IJ SOLN
INTRAMUSCULAR | Status: AC
  Filled 2021-12-06: qty 10

## 2021-12-06 MED ORDER — OXYCODONE-ACETAMINOPHEN 5-325 MG PO TABS: 1.0000 | ORAL_TABLET | ORAL | Status: DC | PRN

## 2021-12-06 MED ORDER — GUAIFENESIN-DM 100-10 MG/5ML PO SYRP: 15.0000 mL | ORAL_SOLUTION | ORAL | Status: DC | PRN

## 2021-12-06 MED ORDER — LABETALOL HCL 5 MG/ML IV SOLN
10.0000 mg | INTRAVENOUS | Status: DC | PRN
  Administered 2021-12-06: 10 mg via INTRAVENOUS

## 2021-12-06 MED ORDER — OXYCODONE HCL 5 MG/5ML PO SOLN: 5.0000 mg | Freq: Once | ORAL | Status: DC | PRN

## 2021-12-06 MED ORDER — ORAL CARE MOUTH RINSE: 15.0000 mL | Freq: Once | OROMUCOSAL | Status: AC

## 2021-12-06 MED ORDER — ONDANSETRON HCL 4 MG/2ML IJ SOLN
INTRAMUSCULAR | Status: AC
  Filled 2021-12-06: qty 2

## 2021-12-06 MED ORDER — SODIUM CHLORIDE 0.9 % IV SOLN: 500.0000 mL | Freq: Once | INTRAVENOUS | Status: DC | PRN

## 2021-12-06 MED ORDER — FENTANYL CITRATE (PF) 250 MCG/5ML IJ SOLN
INTRAMUSCULAR | Status: AC
  Filled 2021-12-06: qty 5

## 2021-12-06 MED ORDER — OXYCODONE HCL 5 MG PO TABS: 5.0000 mg | ORAL_TABLET | Freq: Once | ORAL | Status: DC | PRN

## 2021-12-06 MED ORDER — DEXAMETHASONE SODIUM PHOSPHATE 10 MG/ML IJ SOLN
INTRAMUSCULAR | Status: DC | PRN
  Administered 2021-12-06: 5 mg via INTRAVENOUS

## 2021-12-06 MED ORDER — MELATONIN 5 MG PO TABS
10.0000 mg | ORAL_TABLET | Freq: Every day | ORAL | Status: DC
  Administered 2021-12-06 – 2021-12-19 (×14): 10 mg via ORAL
  Filled 2021-12-06 (×14): qty 2

## 2021-12-06 MED ORDER — HEPARIN 6000 UNIT IRRIGATION SOLUTION
Status: AC
  Filled 2021-12-06: qty 500

## 2021-12-06 MED ORDER — PROPOFOL 10 MG/ML IV BOLUS
INTRAVENOUS | Status: AC
  Filled 2021-12-06: qty 20

## 2021-12-06 MED ORDER — ACETAMINOPHEN 325 MG PO TABS
325.0000 mg | ORAL_TABLET | ORAL | Status: DC | PRN
  Administered 2021-12-06 – 2021-12-15 (×14): 650 mg via ORAL
  Filled 2021-12-06 (×14): qty 2

## 2021-12-06 MED ORDER — PROPOFOL 10 MG/ML IV BOLUS
INTRAVENOUS | Status: DC | PRN
  Administered 2021-12-06 (×3): 20 mg via INTRAVENOUS
  Administered 2021-12-06: 120 mg via INTRAVENOUS
  Administered 2021-12-06 (×2): 20 mg via INTRAVENOUS

## 2021-12-06 MED ORDER — HYDROMORPHONE HCL 1 MG/ML IJ SOLN
0.2500 mg | INTRAMUSCULAR | Status: DC | PRN
  Administered 2021-12-06 (×2): 0.5 mg via INTRAVENOUS

## 2021-12-06 MED ORDER — CHLORHEXIDINE GLUCONATE CLOTH 2 % EX PADS: 6.0000 | MEDICATED_PAD | Freq: Once | CUTANEOUS | Status: DC

## 2021-12-06 MED ORDER — POTASSIUM CHLORIDE CRYS ER 20 MEQ PO TBCR: 20.0000 meq | EXTENDED_RELEASE_TABLET | Freq: Every day | ORAL | Status: DC | PRN

## 2021-12-06 MED ORDER — SUGAMMADEX SODIUM 200 MG/2ML IV SOLN
INTRAVENOUS | Status: DC | PRN
  Administered 2021-12-06: 200 mg via INTRAVENOUS

## 2021-12-06 MED ORDER — MAGNESIUM SULFATE 2 GM/50ML IV SOLN: 2.0000 g | Freq: Every day | INTRAVENOUS | Status: DC | PRN

## 2021-12-06 MED ORDER — CEFAZOLIN SODIUM-DEXTROSE 2-4 GM/100ML-% IV SOLN
2.0000 g | INTRAVENOUS | Status: AC
  Administered 2021-12-06 (×2): 2 g via INTRAVENOUS
  Filled 2021-12-06: qty 100

## 2021-12-06 MED ORDER — PANTOPRAZOLE SODIUM 40 MG PO TBEC
40.0000 mg | DELAYED_RELEASE_TABLET | Freq: Every day | ORAL | Status: DC
  Administered 2021-12-07 – 2021-12-20 (×14): 40 mg via ORAL
  Filled 2021-12-06 (×14): qty 1

## 2021-12-06 MED ORDER — LISINOPRIL 10 MG PO TABS
10.0000 mg | ORAL_TABLET | Freq: Every day | ORAL | Status: DC
  Administered 2021-12-07 – 2021-12-20 (×14): 10 mg via ORAL
  Filled 2021-12-06 (×14): qty 1

## 2021-12-06 MED ORDER — LISINOPRIL-HYDROCHLOROTHIAZIDE 10-12.5 MG PO TABS: 1.0000 | ORAL_TABLET | Freq: Every day | ORAL | Status: DC

## 2021-12-06 MED ORDER — HYDROMORPHONE HCL 1 MG/ML IJ SOLN
INTRAMUSCULAR | Status: AC
  Filled 2021-12-06: qty 1

## 2021-12-06 MED ORDER — MORPHINE SULFATE (PF) 2 MG/ML IV SOLN
2.0000 mg | INTRAVENOUS | Status: DC | PRN
  Administered 2021-12-10 – 2021-12-19 (×15): 2 mg via INTRAVENOUS
  Filled 2021-12-06 (×16): qty 1

## 2021-12-06 MED ORDER — PHENYLEPHRINE 40 MCG/ML (10ML) SYRINGE FOR IV PUSH (FOR BLOOD PRESSURE SUPPORT)
PREFILLED_SYRINGE | INTRAVENOUS | Status: AC
  Filled 2021-12-06: qty 10

## 2021-12-06 MED ORDER — HEPARIN SODIUM (PORCINE) 1000 UNIT/ML IJ SOLN
INTRAMUSCULAR | Status: DC | PRN
  Administered 2021-12-06: 2000 [IU] via INTRAVENOUS
  Administered 2021-12-06: 1000 [IU] via INTRAVENOUS
  Administered 2021-12-06: 2000 [IU] via INTRAVENOUS
  Administered 2021-12-06: 9000 [IU] via INTRAVENOUS

## 2021-12-06 MED ORDER — PHENOL 1.4 % MT LIQD: 1.0000 | OROMUCOSAL | Status: DC | PRN

## 2021-12-06 MED ORDER — CHLORHEXIDINE GLUCONATE CLOTH 2 % EX PADS
6.0000 | MEDICATED_PAD | Freq: Once | CUTANEOUS | Status: DC
  Administered 2021-12-06: 6 via TOPICAL

## 2021-12-06 MED ORDER — LABETALOL HCL 5 MG/ML IV SOLN: 10.0000 mg | INTRAVENOUS | Status: DC | PRN

## 2021-12-06 MED ORDER — CHLORHEXIDINE GLUCONATE 0.12 % MT SOLN
15.0000 mL | Freq: Once | OROMUCOSAL | Status: AC
  Administered 2021-12-06: 15 mL via OROMUCOSAL
  Filled 2021-12-06: qty 15

## 2021-12-06 MED ORDER — 0.9 % SODIUM CHLORIDE (POUR BTL) OPTIME
TOPICAL | Status: DC | PRN
  Administered 2021-12-06: 3000 mL

## 2021-12-06 MED ORDER — DEXMEDETOMIDINE (PRECEDEX) IN NS 20 MCG/5ML (4 MCG/ML) IV SYRINGE
PREFILLED_SYRINGE | INTRAVENOUS | Status: DC | PRN
Start: 2021-12-06 — End: 2021-12-06
  Administered 2021-12-06: 8 ug via INTRAVENOUS

## 2021-12-06 MED ORDER — ROCURONIUM BROMIDE 10 MG/ML (PF) SYRINGE
PREFILLED_SYRINGE | INTRAVENOUS | Status: DC | PRN
  Administered 2021-12-06: 10 mg via INTRAVENOUS
  Administered 2021-12-06: 20 mg via INTRAVENOUS
  Administered 2021-12-06: 90 mg via INTRAVENOUS
  Administered 2021-12-06 (×2): 30 mg via INTRAVENOUS

## 2021-12-06 MED ORDER — PROMETHAZINE HCL 25 MG/ML IJ SOLN: 6.2500 mg | INTRAMUSCULAR | Status: DC | PRN

## 2021-12-06 MED ORDER — LIDOCAINE 2% (20 MG/ML) 5 ML SYRINGE
INTRAMUSCULAR | Status: AC
  Filled 2021-12-06: qty 5

## 2021-12-06 MED ORDER — FENTANYL CITRATE (PF) 250 MCG/5ML IJ SOLN
INTRAMUSCULAR | Status: DC | PRN
  Administered 2021-12-06: 100 ug via INTRAVENOUS
  Administered 2021-12-06 (×4): 50 ug via INTRAVENOUS
  Administered 2021-12-06 (×2): 25 ug via INTRAVENOUS
  Administered 2021-12-06 (×2): 50 ug via INTRAVENOUS

## 2021-12-06 MED ORDER — ROCURONIUM BROMIDE 10 MG/ML (PF) SYRINGE
PREFILLED_SYRINGE | INTRAVENOUS | Status: AC
  Filled 2021-12-06: qty 10

## 2021-12-06 MED ORDER — DOCUSATE SODIUM 100 MG PO CAPS
100.0000 mg | ORAL_CAPSULE | Freq: Every day | ORAL | Status: DC
  Administered 2021-12-07 – 2021-12-20 (×13): 100 mg via ORAL
  Filled 2021-12-06 (×14): qty 1

## 2021-12-06 MED ORDER — HEPARIN 6000 UNIT IRRIGATION SOLUTION
Status: DC | PRN
  Administered 2021-12-06: 1

## 2021-12-06 MED ORDER — LABETALOL HCL 5 MG/ML IV SOLN
INTRAVENOUS | Status: AC
  Filled 2021-12-06: qty 4

## 2021-12-06 MED ORDER — SODIUM CHLORIDE 0.9 % IV SOLN: INTRAVENOUS | Status: AC

## 2021-12-06 MED ORDER — TRAMADOL HCL 50 MG PO TABS
50.0000 mg | ORAL_TABLET | Freq: Three times a day (TID) | ORAL | Status: DC | PRN
  Administered 2021-12-06 – 2021-12-19 (×25): 50 mg via ORAL
  Filled 2021-12-06 (×28): qty 1

## 2021-12-06 MED ORDER — LIDOCAINE 2% (20 MG/ML) 5 ML SYRINGE
INTRAMUSCULAR | Status: DC | PRN
  Administered 2021-12-06: 80 mg via INTRAVENOUS

## 2021-12-06 MED ORDER — ONDANSETRON HCL 4 MG/2ML IJ SOLN: 4.0000 mg | Freq: Four times a day (QID) | INTRAMUSCULAR | Status: DC | PRN

## 2021-12-06 MED ORDER — DEXAMETHASONE SODIUM PHOSPHATE 10 MG/ML IJ SOLN
INTRAMUSCULAR | Status: AC
  Filled 2021-12-06: qty 1

## 2021-12-06 MED ORDER — HYDRALAZINE HCL 20 MG/ML IJ SOLN: 5.0000 mg | INTRAMUSCULAR | Status: DC | PRN

## 2021-12-06 MED ORDER — HYDROXYCHLOROQUINE SULFATE 200 MG PO TABS
200.0000 mg | ORAL_TABLET | Freq: Every morning | ORAL | Status: DC
  Administered 2021-12-07 – 2021-12-20 (×14): 200 mg via ORAL
  Filled 2021-12-06 (×12): qty 1

## 2021-12-06 MED ORDER — PHENYLEPHRINE 40 MCG/ML (10ML) SYRINGE FOR IV PUSH (FOR BLOOD PRESSURE SUPPORT)
PREFILLED_SYRINGE | INTRAVENOUS | Status: DC | PRN
Start: 2021-12-06 — End: 2021-12-06
  Administered 2021-12-06 (×2): 40 ug via INTRAVENOUS

## 2021-12-06 MED ORDER — ONDANSETRON HCL 4 MG/2ML IJ SOLN
INTRAMUSCULAR | Status: DC | PRN
  Administered 2021-12-06: 4 mg via INTRAVENOUS

## 2021-12-06 MED ORDER — CEFAZOLIN SODIUM 1 G IJ SOLR
INTRAMUSCULAR | Status: AC
  Filled 2021-12-06: qty 20

## 2021-12-06 MED ORDER — ACETAMINOPHEN 650 MG RE SUPP: 325.0000 mg | RECTAL | Status: DC | PRN

## 2021-12-06 MED ORDER — SODIUM CHLORIDE 0.9 % IV SOLN: INTRAVENOUS | Status: DC

## 2021-12-06 MED ORDER — LACTATED RINGERS IV SOLN: INTRAVENOUS | Status: DC

## 2021-12-06 MED ORDER — ALUM & MAG HYDROXIDE-SIMETH 200-200-20 MG/5ML PO SUSP: 15.0000 mL | ORAL | Status: DC | PRN

## 2021-12-06 MED ORDER — AMISULPRIDE (ANTIEMETIC) 5 MG/2ML IV SOLN: 10.0000 mg | Freq: Once | INTRAVENOUS | Status: DC | PRN

## 2021-12-06 MED ORDER — PHENYLEPHRINE HCL-NACL 20-0.9 MG/250ML-% IV SOLN
INTRAVENOUS | Status: DC | PRN
  Administered 2021-12-06: 20 ug/min via INTRAVENOUS

## 2021-12-06 MED ORDER — HYDROCHLOROTHIAZIDE 12.5 MG PO TABS
12.5000 mg | ORAL_TABLET | Freq: Every day | ORAL | Status: DC
  Administered 2021-12-07 – 2021-12-20 (×14): 12.5 mg via ORAL
  Filled 2021-12-06 (×14): qty 1

## 2021-12-06 MED ORDER — CEFAZOLIN SODIUM-DEXTROSE 2-4 GM/100ML-% IV SOLN
2.0000 g | Freq: Three times a day (TID) | INTRAVENOUS | Status: AC
  Administered 2021-12-06 – 2021-12-07 (×2): 2 g via INTRAVENOUS
  Filled 2021-12-06 (×2): qty 100

## 2021-12-06 SURGICAL SUPPLY — 64 items
ADH SKN CLS APL DERMABOND .7 (GAUZE/BANDAGES/DRESSINGS) ×4
APL PRP STRL LF DISP 70% ISPRP (MISCELLANEOUS) ×4
APL SKNCLS STERI-STRIP NONHPOA (GAUZE/BANDAGES/DRESSINGS) ×6
BAG COUNTER SPONGE SURGICOUNT (BAG) ×3 IMPLANT
BAG SPNG CNTER NS LX DISP (BAG) ×2
BANDAGE ESMARK 6X9 LF (GAUZE/BANDAGES/DRESSINGS) IMPLANT
BENZOIN TINCTURE PRP APPL 2/3 (GAUZE/BANDAGES/DRESSINGS) ×9 IMPLANT
BNDG CMPR 9X6 STRL LF SNTH (GAUZE/BANDAGES/DRESSINGS) ×2
BNDG ELASTIC 4X5.8 VLCR STR LF (GAUZE/BANDAGES/DRESSINGS) ×1 IMPLANT
BNDG ESMARK 6X9 LF (GAUZE/BANDAGES/DRESSINGS) ×3
BNDG GAUZE ELAST 4 BULKY (GAUZE/BANDAGES/DRESSINGS) ×2 IMPLANT
CANISTER SUCT 3000ML PPV (MISCELLANEOUS) ×3 IMPLANT
CANNULA VESSEL 3MM 2 BLNT TIP (CANNULA) ×3 IMPLANT
CATH EMB 2FR 60CM (CATHETERS) ×2 IMPLANT
CHLORAPREP W/TINT 26 (MISCELLANEOUS) ×6 IMPLANT
COVER PROBE W GEL 5X96 (DRAPES) ×1 IMPLANT
DERMABOND ADVANCED (GAUZE/BANDAGES/DRESSINGS) ×2
DERMABOND ADVANCED .7 DNX12 (GAUZE/BANDAGES/DRESSINGS) IMPLANT
DRAPE C-ARM 42X72 X-RAY (DRAPES) ×1 IMPLANT
ELECT REM PT RETURN 9FT ADLT (ELECTROSURGICAL) ×3
ELECTRODE REM PT RTRN 9FT ADLT (ELECTROSURGICAL) ×2 IMPLANT
GAUZE SPONGE 4X4 12PLY STRL (GAUZE/BANDAGES/DRESSINGS) ×4 IMPLANT
GAUZE XEROFORM 5X9 LF (GAUZE/BANDAGES/DRESSINGS) ×1 IMPLANT
GLOVE SURG POLYISO LF SZ8 (GLOVE) ×6 IMPLANT
GOWN STRL REUS W/ TWL LRG LVL3 (GOWN DISPOSABLE) ×4 IMPLANT
GOWN STRL REUS W/ TWL XL LVL3 (GOWN DISPOSABLE) ×2 IMPLANT
GOWN STRL REUS W/TWL LRG LVL3 (GOWN DISPOSABLE) ×6
GOWN STRL REUS W/TWL XL LVL3 (GOWN DISPOSABLE) ×3
HEMOSTAT SNOW SURGICEL 2X4 (HEMOSTASIS) IMPLANT
INSERT FOGARTY SM (MISCELLANEOUS) IMPLANT
KIT BASIN OR (CUSTOM PROCEDURE TRAY) ×3 IMPLANT
KIT TURNOVER KIT B (KITS) ×3 IMPLANT
MARKER GRAFT CORONARY BYPASS (MISCELLANEOUS) IMPLANT
NS IRRIG 1000ML POUR BTL (IV SOLUTION) ×6 IMPLANT
PACK PERIPHERAL VASCULAR (CUSTOM PROCEDURE TRAY) ×3 IMPLANT
PAD ARMBOARD 7.5X6 YLW CONV (MISCELLANEOUS) ×6 IMPLANT
PENCIL SMOKE EVACUATOR (MISCELLANEOUS) ×1 IMPLANT
SET COLLECT BLD 21X3/4 12 (NEEDLE) ×1 IMPLANT
SPONGE T-LAP 12X12 ~~LOC~~+RFID (SPONGE) ×1 IMPLANT
SPONGE T-LAP 18X18 ~~LOC~~+RFID (SPONGE) ×2 IMPLANT
STAPLER VISISTAT 35W (STAPLE) ×1 IMPLANT
STOPCOCK 4 WAY LG BORE MALE ST (IV SETS) ×1 IMPLANT
STRIP CLOSURE SKIN 1/2X4 (GAUZE/BANDAGES/DRESSINGS) ×9 IMPLANT
SUT ETHILON 2 0 PSLX (SUTURE) ×1 IMPLANT
SUT ETHILON 3 0 PS 1 (SUTURE) ×2 IMPLANT
SUT MNCRL AB 4-0 PS2 18 (SUTURE) ×6 IMPLANT
SUT PROLENE 5 0 C 1 24 (SUTURE) ×4 IMPLANT
SUT PROLENE 6 0 BV (SUTURE) ×19 IMPLANT
SUT SILK 2 0 SH (SUTURE) ×3 IMPLANT
SUT SILK 3 0 (SUTURE) ×6
SUT SILK 3-0 18XBRD TIE 12 (SUTURE) IMPLANT
SUT VIC AB 2-0 CT1 27 (SUTURE) ×9
SUT VIC AB 2-0 CT1 TAPERPNT 27 (SUTURE) ×4 IMPLANT
SUT VIC AB 3-0 SH 27 (SUTURE) ×6
SUT VIC AB 3-0 SH 27X BRD (SUTURE) ×4 IMPLANT
SYR 20ML LL LF (SYRINGE) ×2 IMPLANT
SYR 3ML LL SCALE MARK (SYRINGE) ×1 IMPLANT
TAPE UMBILICAL 1/8X30 (MISCELLANEOUS) ×2 IMPLANT
TAPE UMBILICAL COTTON 1/8X30 (MISCELLANEOUS) ×3 IMPLANT
TOWEL GREEN STERILE (TOWEL DISPOSABLE) ×3 IMPLANT
TRAY FOLEY MTR SLVR 16FR STAT (SET/KITS/TRAYS/PACK) ×3 IMPLANT
TUBING EXTENTION W/L.L. (IV SETS) ×1 IMPLANT
UNDERPAD 30X36 HEAVY ABSORB (UNDERPADS AND DIAPERS) ×3 IMPLANT
WATER STERILE IRR 1000ML POUR (IV SOLUTION) ×3 IMPLANT

## 2021-12-06 NOTE — Anesthesia Preprocedure Evaluation (Signed)
Anesthesia Evaluation  ?Patient identified by MRN, date of birth, ID band ?Patient awake ? ? ? ?Reviewed: ?Allergy & Precautions, NPO status , Patient's Chart, lab work & pertinent test results ? ?Airway ?Mallampati: II ? ?TM Distance: >3 FB ?Neck ROM: Full ? ? ? Dental ?no notable dental hx. ?(+) Upper Dentures, Lower Dentures ?  ?Pulmonary ?former smoker,  ?  ?Pulmonary exam normal ?breath sounds clear to auscultation ? ? ? ? ? ? Cardiovascular ?hypertension, Pt. on medications ?+ Peripheral Vascular Disease  ?Normal cardiovascular exam+ dysrhythmias Atrial Fibrillation  ?Rhythm:Regular Rate:Normal ? ?8/21`TEE ?1. Left ventricular ejection fraction, by estimation, is 65 to 70%. The left ventricle has normal ?function. The left ventricle has no regional wall motion abnormalities. There is moderate ?asymmetric left ventricular hypertrophy of the septal segment. Left ventricular diastolic ?parameters are indeterminate. ?2. RV-RA gradient normal at 18 mmHg. Right ventricular systolic function is normal. The right ?ventricular size is normal. ?3. The mitral valve is grossly normal. Trivial mitral valve regurgitation. ?4. The aortic valve is tricuspid. Aortic valve regurgitation is mild. Mild to moderate aortic valve ?sclerosis/calcification is present, without any evidence of aortic stenosis. Aortic valve area, ?by VTI measures 2.66 cm?Marland Kitchen Aortic valve mean gradient measures 8.8 mmHg. Aortic valve ?Vmax measures 2.11 m/s. ?5. Unable to estimate CVP. ?  ?Neuro/Psych ? Headaches, CVA (L sided weakness), Residual Symptoms   ? GI/Hepatic ?hiatal hernia, GERD  ,  ?Endo/Other  ?negative endocrine ROS ? Renal/GU ?negative Renal ROSLab Results ?     Component                Value               Date                 ?     CREATININE               0.60 (L)            04/12/2021           ?     BUN                      9                   04/12/2021           ?     NA                       139                  04/12/2021           ?     K                        4.5                 04/12/2021           ?     CL                       104                 04/12/2021           ?     CO2  29                  04/12/2021           ?  ? ?Prostate hyperplasia ? ?  ?Musculoskeletal ? ?(+) Arthritis ,  ? Abdominal ?(+) + obese (BMI 33.08),   ?Peds ? Hematology ? ?(+) Blood dyscrasia, anemia , Lab Results ?     Component                Value               Date                 ?     WBC                      8.0                 04/12/2021           ?     HGB                      11.8 (L)            04/12/2021           ?     HCT                      38.7 (L)            04/12/2021           ?     MCV                      85.8                04/12/2021           ?     PLT                      200                 04/12/2021           ?   ?Anesthesia Other Findings ?All Sulfa ? ?R BKA ? Reproductive/Obstetrics ? ?  ? ? ? ? ? ? ? ? ? ? ? ? ? ?  ?  ? ? ? ? ? ? ? ? ?Anesthesia Physical ? ?Anesthesia Plan ? ?ASA: 3 ? ?Anesthesia Plan: General  ? ?Post-op Pain Management:   ? ?Induction: Intravenous ? ?PONV Risk Score and Plan: 2 and Treatment may vary due to age or medical condition, Ondansetron and Midazolam ? ?Airway Management Planned: LMA and Oral ETT ? ?Additional Equipment: None ? ?Intra-op Plan:  ? ?Post-operative Plan: Extubation in OR ? ?Informed Consent: I have reviewed the patients History and Physical, chart, labs and discussed the procedure including the risks, benefits and alternatives for the proposed anesthesia with the patient or authorized representative who has indicated his/her understanding and acceptance.  ? ? ? ?Dental advisory given ? ?Plan Discussed with: CRNA ? ?Anesthesia Plan Comments:   ? ? ? ? ? ? ?Anesthesia Quick Evaluation ? ?

## 2021-12-06 NOTE — Anesthesia Procedure Notes (Signed)
Arterial Line Insertion ?Start/End3/05/2022 8:45 AM, 12/06/2021 8:49 AM ?Performed by: Betha Loa, CRNA, CRNA ? Patient location: Pre-op. ?Preanesthetic checklist: patient identified, IV checked, site marked, risks and benefits discussed, surgical consent, monitors and equipment checked, pre-op evaluation, timeout performed and anesthesia consent ?Lidocaine 1% used for infiltration ?Left, radial was placed ?Catheter size: 20 G ?Hand hygiene performed  and maximum sterile barriers used  ? ?Attempts: 1 ?Procedure performed without using ultrasound guided technique. ?Following insertion, dressing applied and Biopatch. ?Post procedure assessment: normal and unchanged ? ? ? ?

## 2021-12-06 NOTE — Interval H&P Note (Signed)
History and Physical Interval Note: ? ?12/06/2021 ?8:13 AM ? ?Jose Bush  has presented today for surgery, with the diagnosis of Atherosclerosis of native arteries of left lower extremity with ulceration.  The various methods of treatment have been discussed with the patient and family. After consideration of risks, benefits and other options for treatment, the patient has consented to  Procedure(s): ?LEFT POSTERIOR TIBIAL VEIN ARTERIALIZATION (Left) as a surgical intervention.  The patient's history has been reviewed, patient examined, no change in status, stable for surgery.  I have reviewed the patient's chart and labs.  Questions were answered to the patient's satisfaction.   ? ?I had multiple discussion with the patient about this procedure. He understands this is a "last ditch" effort to try to save his foot. It does not have a high probability of success, but it is our last option for limb salvage.  ? ? ?[name] ? ? ?

## 2021-12-06 NOTE — Op Note (Signed)
DATE OF SERVICE: 12/06/2021  PATIENT:  Jose Bush  81 y.o. male  PRE-OPERATIVE DIAGNOSIS:  atherosclerosis of native arteries of left lower extremity causing ulceration; no conventional options for revascularization  POST-OPERATIVE DIAGNOSIS:  Same  PROCEDURE:   1) left greater saphenous vein harvest 2) creation of left posterior tibial artery and vein arteriovenous fistula 3) left below knee popliteal artery to left posterior tibial artery vein fistula bypass 4) left second toe partial amputation  SURGEON:  Surgeon(s) and Role:    * [name], MD - Primary  ASSISTANT: Arlee Muslim, PA-C  An experienced assistant was required given the complexity of this procedure and the standard of surgical care. My assistant helped with exposure through counter tension, suctioning, ligation and retraction to better visualize the surgical field.  My assistant expedited sewing during the case by following my sutures. Wherever I use the term "we" in the report, my assistant actively helped me with that portion of the procedure.  ANESTHESIA:   general  EBL: 223mL  BLOOD ADMINISTERED:none  DRAINS: none   LOCAL MEDICATIONS USED:  none  SPECIMEN:  none  COUNTS: confirmed correct.  TOURNIQUET:  none  PATIENT DISPOSITION:  PACU - hemodynamically stable.   Delay start of Pharmacological VTE agent (>24hrs) due to surgical blood loss or risk of bleeding: no  INDICATION FOR PROCEDURE: Jose Bush is a 81 y.o. male with atherosclerosis of native arteries of left lower extremity causing ulceration.  Preoperative angiogram was performed.  This showed a "desert" of severely diseased pedal arteries.  There is no option for conventional revascularization.  I offer the patient a deep venous arterialization.  We were unable to do this endovascularly.  I offered him an open deep venous arterialization.  Counseled him extensively that this had a high likelihood of failure and eventual limb loss.   He is understanding that this was his only option to try to avoid amputation and wanted to proceed.  OPERATIVE FINDINGS: High quality saphenous vein harvested from mid thigh to mid calf.  Below-knee popliteal artery healthy and used as the inflow artery.  The posterior tibial artery and vein were exposed and the hindfoot.  The 2 vessels were opened longitudinally and a common, posterior channel created between the 2 vessels.  The saphenous vein conduit was reversed and sewn end-to-side to the common channel of the posterior tibial arteriovenous fistula.  At completion there was little flow out of the fistula.  Angiogram confirmed no flow out of the fistula.  I revise the distal anastomosis.  There was then arterialized Doppler flow in the pedal veins.  The left second partial toe amputation was performed.  Good bleeding was noted.  DESCRIPTION OF PROCEDURE: After identification of the patient in the pre-operative holding area, the patient was transferred to the operating room. The patient was positioned supine on the operating room table. Anesthesia was induced. The left leg was prepped and draped in standard fashion. A surgical pause was performed confirming correct patient, procedure, and operative location.  Using intraoperative ultrasound the course of the greater saphenous vein was marked on the skin.  Skip incisions were used to harvest the greater saphenous vein from mid thigh to mid calf.  Incisions were carried down through subtenons tissue until the greater saphenous vein was identified.  The greater saphenous vein was skeletonized.  All side branches were identified, ligated proximally distally with silk suture and divided.  The entire vein was then skeletonized.  The vein was clamped proximally  distally.  The vein was excised and passed off the table to allow to dwell in a heparinized saline solution.  The proximal and distal stumps were oversewn with 2-0 silk.  The below-knee popliteal artery  was exposed through a medial calf incision.  The incision was carried down through subtenons tissue until the posterior fascia was encountered.  This was divided with Bovie electrocautery.  The gastrocnemius was swept posteriorly.  The popliteal neurovascular bundle was identified.  The tibial nerve was identified and protected.  The popliteal artery was encircled.  The popliteal artery was found to be healthy.  The posterior tibial artery and vein were identified using intraoperative ultrasound below the medial malleolus.  A curvilinear incision was made on the hindfoot and carried down through subcutaneous tissue until the fascia overlying the posterior tibial vascular bundle was identified.  This was divided with Bovie electrocautery.  The artery and vein were identified and skeletonized for several centimeters.  A subfascial tunnel was created between the 2 exposures using a Kelly-Wick tunneler.  Patient was systemically heparinized.  Activated clotting time measurements were used throughout the case to confirm adequate anticoagulation.  The saphenous vein conduit was prepared on the back table.  All areas of leak were repaired with 6-0 Prolene.  The conduit was reversed.  The below-knee popliteal artery was clamped with a Cooley clamp.  A anterior lateral arteriotomy was made with an 11 blade extended with Potts scissors.  The reversed portion of the saphenous vein graft was sewn end to side to the popliteal arteriotomy using continuous running suture of 6-0 Prolene.  The anastomosis was completed flushed down the open end of the graft.  The graft was then secured to the obturator of the tunneler and delivered with great care to avoid twisting or kinking the graft into the posterior tibial exposure.  The graft was pressurized and marked to lay with no tension or redundancy at the distal anastomosis.  The graft was then clamped and attention was turned to the posterior tibial artery and vein.  I opened  both vessels longitudinally using an 11 blade and extending them with Potts scissors.  I passed coronary dilators antegrade and retrograde in both artery and vein.  Good backbleeding was achieved from the vein from the foot.  Limited backbleeding was achieved from the posterior tibial artery which had already thrombosed despite recanalization on Friday.  The arteriotomy and venotomy were then combined into a common channel using continuous running suture of 6-0 Prolene on the medial aspects of both vessels securing them together.  I then brought the hood of the bypass down onto the lateral walls of the 2 vessels and sewed this in continuous running fashion using 6-0 Prolene to create an anastomosis to this arteriovenous fistula.  Upon completion there was little to no Doppler flow in the pedal veins or arteries.  I performed on table angiography.  This confirmed absence of flow into the foot.  I passed coronary dilators through the anastomosis into each of the 4 vessels and found the bypass was open.  I could not explain why there is no Doppler flow beyond this and so I elected to redo my distal anastomosis.  The distal anastomosis was taken down sharply.  I revise this anastomosis using continuous running suture of 6-0 Prolene.  After completion I exposed the pedal veins for another several centimeters.  I opened the veins and found pulsatile flow.  I passed a coronary dilator proximally and distally through the vein  and into the bypass anastomosis.  Brisk Doppler flow was noted in the pedal vein.  Satisfied end of the case here.  Protamine was reversed with heparin.  The incisions were closed in layers using 2-0 Vicryl, 3-0 Vicryl, 4-0 Monocryl in the thigh and proximal calf.  The mid calf and foot incisions were closed with 3-0 Vicryl, a surgical stapler, and 2-0 nylon.  I then performed a left second toe partial amputation.  I made incision just above the necrotic material at the distal PIP joint.   Incision was carried down sharply with a 10 blade.  The bone was transected using a bone cutter.  Healthy bleeding tissue was encountered.  I elected to leave this amputation open.  A bulky dressing was applied to the foot.  The entire leg was dressed with clean sterile bandages.  An Ace wrap was placed from toes to thigh.  Upon completion of the case instrument and sharps counts were confirmed correct. The patient was transferred to the PACU in good condition. I was present for all portions of the procedure.  Yevonne Aline. Stanford Breed, MD [facility] Phone Number: [phone] 12/06/2021 1:43 PM

## 2021-12-06 NOTE — Progress Notes (Signed)
ANTICOAGULATION CONSULT NOTE - Initial Consult ? ?Pharmacy Consult for heparin ?Indication: DVT and stroke ? ?Allergies  ?Allergen Reactions  ? Sulfa Antibiotics   ?  Weak and dizziness  ? ? ?Patient Measurements: ?Height: 5\' 8"  (172.7 cm) ?Weight: 90.3 kg (199 lb 1.2 oz) ?IBW/kg (Calculated) : 68.4 ?Heparin Dosing Weight: 87kg ? ?Vital Signs: ?Temp: 98.1 ?F (36.7 ?C) (03/08 1555) ?Temp Source: Oral (03/08 1555) ?BP: 154/71 (03/08 1555) ?Pulse Rate: 71 (03/08 1555) ? ?Labs: ?Recent Labs  ?  12/06/21 ?0730 12/06/21 ?1155  ?HGB 10.6* 10.5*  ?HCT 33.6* 31.0*  ?PLT 218  --   ?APTT 29  --   ?LABPROT 15.8*  --   ?INR 1.3*  --   ?CREATININE 0.62  --   ? ? ?Estimated Creatinine Clearance: 79.1 mL/min (by C-G formula based on SCr of 0.62 mg/dL). ? ? ?Medical History: ?Past Medical History:  ?Diagnosis Date  ? Aortic stenosis   ? Aortic sclerosis without stenosis on echo 8/21  ? Arthritis   ? Atrial fibrillation ([place])   ? BPH (benign prostatic hyperplasia)   ? Essential hypertension   ? GERD (gastroesophageal reflux disease)   ? Heart murmur   ? History of DVT (deep vein thrombosis)   ? Positive anticardiolipin  ? History of hiatal hernia   ? History of stroke 2010  ? Lupus ([place])   ? PAD (peripheral artery disease) ([place])   ? Right below-knee amputation in 2019  ? Pulmonary embolism ([place])   ? hx of 10 years ago  ? Shingles   ? Stroke [facility])   ? Tinnitus   ? Wears dentures   ? Wears glasses   ? ? ?Medications:  ?Medications Prior to Admission  ?Medication Sig Dispense Refill Last Dose  ? acetaminophen (TYLENOL) 650 MG CR tablet Take 650 mg by mouth in the morning and at bedtime.   12/05/2021  ? Bioflavonoid Products (ESTER C PO) Take 500 mg by mouth daily as needed (immune support/health).   Past Week  ? docusate sodium (COLACE) 100 MG capsule Take 100 mg by mouth in the morning.   12/05/2021  ? hydroxychloroquine (PLAQUENIL) 200 MG tablet Take 200 mg by mouth in the morning.   12/05/2021  ? Krill Oil 500 MG CAPS Take 500 mg by mouth  in the morning.   12/05/2021  ? lisinopril-hydrochlorothiazide (PRINZIDE,ZESTORETIC) 10-12.5 MG per tablet Take 1 tablet by mouth daily with breakfast.    12/05/2021  ? loratadine (CLARITIN) 10 MG tablet Take 10 mg by mouth in the morning.   12/05/2021  ? melatonin 5 MG TABS Take 10 mg by mouth at bedtime.   12/05/2021  ? Menthol, Topical Analgesic, (ICY HOT EX) Apply 1 application topically 3 (three) times daily as needed (pain.).   Past Week  ? omeprazole (PRILOSEC) 20 MG capsule Take 20 mg by mouth daily.   12/05/2021  ? predniSONE (DELTASONE) 5 MG tablet Take 5 mg by mouth daily with breakfast.   12/05/2021  ? solifenacin (VESICARE) 10 MG tablet Take 1 tablet (10 mg total) by mouth daily. 90 tablet 3 12/05/2021  ? traMADol (ULTRAM) 50 MG tablet Take 50 mg by mouth 2 (two) times daily as needed for pain.   Past Week  ? warfarin (COUMADIN) 4 MG tablet Take 4 mg by mouth every evening.   12/01/2021  ? ?Scheduled:  ? [START ON 12/07/2021] docusate sodium  100 mg Oral Daily  ? [START ON 12/07/2021] lisinopril  10 mg Oral Daily  ?  And  ? [START ON 12/07/2021] hydrochlorothiazide  12.5 mg Oral Daily  ? HYDROmorphone      ? [START ON 12/07/2021] hydroxychloroquine  200 mg Oral q AM  ? labetalol      ? melatonin  10 mg Oral QHS  ? [START ON 12/07/2021] pantoprazole  40 mg Oral Daily  ? ?Infusions:  ? sodium chloride    ? sodium chloride    ?  ceFAZolin (ANCEF) IV    ? magnesium sulfate bolus IVPB    ? ? ?Assessment: ?Pt was on coumadin PTA for a hx PAD/CVA/DVT. His baseline INR 1.3 after holding. S/p tibial artery vein fistula bypass with left toe amputation. Plan to start low dose heparin tonight per [facility].  ? ?Goal of Therapy:  ?Heparin level 0.3-0.5 units/ml ?Monitor platelets by anticoagulation protocol: Yes ?  ?Plan:  ?Heparin 500 units/hr start at 1900  ?HL in AM then daily ?F/u resume coumadin ? ?Onnie Boer, PharmD, BCIDP, AAHIVP, CPP ?Infectious Disease Pharmacist ?12/06/2021 4:23 PM ? ? ? ? ?

## 2021-12-06 NOTE — Transfer of Care (Signed)
Immediate Anesthesia Transfer of Care Note ? ?Patient: Jose Bush ? ?Procedure(s) Performed: LEFT POSTERIOR TIBIAL VEIN ARTERIALIZATION WITH GREATER SAPHENOUS VEIN HARVEST (Left: Leg Lower) ?PARTIAL AMPUTATION OF SECOND TOE OF LEFT FOOT (Left: Foot) ? ?Patient Location: PACU ? ?Anesthesia Type:General ? ?Level of Consciousness: drowsy ? ?Airway & Oxygen Therapy: Patient Spontanous Breathing and Patient connected to face mask oxygen ? ?Post-op Assessment: Report given to RN and Post -op Vital signs reviewed and stable ? ?Post vital signs: Reviewed and stable ? ?Last Vitals:  ?Vitals Value Taken Time  ?BP 119/59 12/06/21 1417  ?Temp    ?Pulse 74 12/06/21 1418  ?Resp 13 12/06/21 1418  ?SpO2 98 % 12/06/21 1418  ?Vitals shown include unvalidated device data. ? ?Last Pain:  ?Vitals:  ? 12/06/21 0725  ?TempSrc:   ?PainSc: 4   ?   ? ?Patients Stated Pain Goal: 0 (12/06/21 0725) ? ?Complications: No notable events documented. ?

## 2021-12-06 NOTE — Anesthesia Procedure Notes (Signed)
Procedure Name: Intubation ?Date/Time: 12/06/2021 8:44 AM ?Performed by: Erick Colace, CRNA ?Pre-anesthesia Checklist: Patient identified, Emergency Drugs available, Suction available and Patient being monitored ?Patient Re-evaluated:Patient Re-evaluated prior to induction ?Oxygen Delivery Method: Circle system utilized ?Preoxygenation: Pre-oxygenation with 100% oxygen ?Induction Type: IV induction ?Ventilation: Mask ventilation without difficulty and Oral airway inserted - appropriate to patient size ?Laryngoscope Size: Mac and 4 ?Grade View: Grade I ?Tube type: Oral ?Tube size: 7.5 mm ?Number of attempts: 1 ?Airway Equipment and Method: Stylet and Oral airway ?Placement Confirmation: ETT inserted through vocal cords under direct vision, positive ETCO2 and breath sounds checked- equal and bilateral ?Secured at: 22 cm ?Tube secured with: Tape ?Dental Injury: Teeth and Oropharynx as per pre-operative assessment  ? ? ? ? ?

## 2021-12-06 NOTE — Anesthesia Postprocedure Evaluation (Signed)
Anesthesia Post Note ? ?Patient: Jose Bush ? ?Procedure(s) Performed: LEFT POSTERIOR TIBIAL VEIN ARTERIALIZATION WITH GREATER SAPHENOUS VEIN HARVEST (Left: Leg Lower) ?PARTIAL AMPUTATION OF SECOND TOE OF LEFT FOOT (Left: Foot) ? ?  ? ?Patient location during evaluation: PACU ?Anesthesia Type: General ?Level of consciousness: awake and alert ?Pain management: pain level controlled ?Vital Signs Assessment: post-procedure vital signs reviewed and stable ?Respiratory status: spontaneous breathing, nonlabored ventilation and respiratory function stable ?Cardiovascular status: blood pressure returned to baseline and stable ?Postop Assessment: no apparent nausea or vomiting ?Anesthetic complications: no ? ? ?No notable events documented. ? ?Last Vitals:  ?Vitals:  ? 12/06/21 1517 12/06/21 1530  ?BP: 137/64 140/63  ?Pulse: 66 66  ?Resp: 10 10  ?Temp:    ?SpO2: 95% 97%  ?  ?Last Pain:  ?Vitals:  ? 12/06/21 1530  ?TempSrc:   ?PainSc: Asleep  ? ? ?  ?  ?  ?  ?  ?  ? ?Lynda Rainwater ? ? ? ? ?

## 2021-12-07 ENCOUNTER — Encounter (HOSPITAL_COMMUNITY): Payer: Self-pay | Admitting: Vascular Surgery

## 2021-12-07 LAB — CBC
HCT: 32.1 % — ABNORMAL LOW (ref 39.0–52.0)
Hemoglobin: 10.2 g/dL — ABNORMAL LOW (ref 13.0–17.0)
MCH: 27.6 pg (ref 26.0–34.0)
MCHC: 31.8 g/dL (ref 30.0–36.0)
MCV: 86.8 fL (ref 80.0–100.0)
Platelets: 240 10*3/uL (ref 150–400)
RBC: 3.7 MIL/uL — ABNORMAL LOW (ref 4.22–5.81)
RDW: 15.4 % (ref 11.5–15.5)
WBC: 10.4 10*3/uL (ref 4.0–10.5)
nRBC: 0 % (ref 0.0–0.2)

## 2021-12-07 LAB — BASIC METABOLIC PANEL
Anion gap: 8 (ref 5–15)
BUN: 12 mg/dL (ref 8–23)
CO2: 25 mmol/L (ref 22–32)
Calcium: 8.6 mg/dL — ABNORMAL LOW (ref 8.9–10.3)
Chloride: 105 mmol/L (ref 98–111)
Creatinine, Ser: 0.73 mg/dL (ref 0.61–1.24)
GFR, Estimated: >60 mL/min (ref >60)
Glucose, Bld: 117 mg/dL — ABNORMAL HIGH (ref 70–99)
Potassium: 3.6 mmol/L (ref 3.5–5.1)
Sodium: 138 mmol/L (ref 135–145)

## 2021-12-07 LAB — HEPARIN LEVEL (UNFRACTIONATED)
Heparin Unfractionated: <0.1 IU/mL — ABNORMAL LOW (ref 0.30–0.70)
Heparin Unfractionated: <0.1 IU/mL — ABNORMAL LOW (ref 0.30–0.70)
Heparin Unfractionated: <0.1 IU/mL — ABNORMAL LOW (ref 0.30–0.70)

## 2021-12-07 MED ORDER — PREDNISONE 5 MG PO TABS
5.0000 mg | ORAL_TABLET | Freq: Every day | ORAL | Status: DC
  Administered 2021-12-07 – 2021-12-20 (×14): 5 mg via ORAL
  Filled 2021-12-07 (×14): qty 1

## 2021-12-07 NOTE — Evaluation (Signed)
Physical Therapy Evaluation Patient Details Name: Jose Bush MRN: [id number] DOB: December 01, 1940 Today's Date: 12/07/2021  History of Present Illness  Pt is an 81 y/o male who presented 12/06/21 for evaluation of L 1st and 2nd toe ulceration with hx of PVD. Pt underwent L popliteal artery to posterior tibial artery bypass and partial L second toe amputation 3/8. PMH: R BKA, a fib, BPH, PAD, PE, CVA, shingles, GERD, aortic stenosis.   Clinical Impression  Pt presents with condition above and deficits mentioned below, see PT Problem List. PTA, he was mod I with a locked rollator ambulating household distances with his R lower extremity prosthesis donned. Pt lives with his wife in a 1-level house with a ramped entrance. Currently, pt presents with inability to detect touch distally in his L lower extremity and limitations in L knee and ankle AROM with the ACE wrap donned. Pt also with deficits in gross overall strength, balance, and activity tolerance. He required modA to transition supine > sit, TA to scoot laterally EOB and transition sit > supine, and maxA to come to partial stand in stedy 2x today. He is at high risk for falls. Recommending intensive therapy in the AIR setting to maximize his independence and safety with all functional mobility as he has had a significant decline in functional status and is motivated to improve. Will continue to follow acutely.       Recommendations for follow up therapy are one component of a multi-disciplinary discharge planning process, led by the attending physician.  Recommendations may be updated based on patient status, additional functional criteria and insurance authorization.  Follow Up Recommendations Acute inpatient rehab (3hours/day)    Assistance Recommended at Discharge Frequent or constant Supervision/Assistance  Patient can return home with the following  A lot of help with walking and/or transfers;Two people to help with walking and/or transfers;A  lot of help with bathing/dressing/bathroom;Two people to help with bathing/dressing/bathroom;Assistance with cooking/housework;Direct supervision/assist for medications management;Direct supervision/assist for financial management;Assist for transportation;Help with stairs or ramp for entrance    Equipment Recommendations Rolling walker (2 wheels)  Recommendations for Other Services  Rehab consult    Functional Status Assessment Patient has had a recent decline in their functional status and demonstrates the ability to make significant improvements in function in a reasonable and predictable amount of time.     Precautions / Restrictions Precautions Precautions: Fall;Other (comment) Precaution Comments: hx of R BKA with prosthetic Required Braces or Orthoses: Other Brace Other Brace: L darco shoe Restrictions Weight Bearing Restrictions: Yes LLE Weight Bearing: Partial weight bearing Other Position/Activity Restrictions: WB through heel      Mobility  Bed Mobility Overal bed mobility: Needs Assistance Bed Mobility: Supine to Sit, Sit to Supine     Supine to sit: Mod assist, HOB elevated Sit to supine: Total assist   General bed mobility comments: assist to lift trunk, increased time to negotiate LEs to EOB but able to do so using bedrail. TA to return to supine.    Transfers Overall transfer level: Needs assistance Equipment used: Ambulation equipment used Transfers: Sit to/from Stand Sit to Stand: Max assist, From elevated surface           General transfer comment: x2 sit to stand reps from elevated EOB to stedy, cuing pt to extend hips and knees but pt maintained a flexed posture with feet sliding anteriorly under knee block of stedy at times. MaxA to power up and attempt to extend pt's hips. TA to scoot laterally  EOB with pt leaning posteriorly    Ambulation/Gait               General Gait Details: Unable  Stairs            Wheelchair Mobility     Modified Rankin (Stroke Patients Only)       Balance Overall balance assessment: Needs assistance Sitting-balance support: No upper extremity supported, Feet supported, Single extremity supported Sitting balance-Leahy Scale: Fair Sitting balance - Comments: Pt with L lateral lean, needing R UE support on end of bed rail initially. Progressed to min guard with no UE support with pt donning sleeve of R prosthesis. Postural control: Left lateral lean, Posterior lean Standing balance support: Bilateral upper extremity supported, During functional activity Standing balance-Leahy Scale: Zero Standing balance comment: MaxA and bil UE support to come to partial stand                             Pertinent Vitals/Pain Pain Assessment Pain Assessment: Faces Faces Pain Scale: Hurts little more Pain Location: L foot Pain Descriptors / Indicators: Grimacing, Guarding, Operative site guarding Pain Intervention(s): Monitored during session, Limited activity within patient's tolerance, Repositioned    Home Living Family/patient expects to be discharged to:: Private residence Living Arrangements: Spouse/significant other Available Help at Discharge: Family Type of Home: House Home Access: Ramped entrance       Home Layout: One level Home Equipment: Rollator (4 wheels);Wheelchair - manual;Electric scooter;Hospital bed;Tub bench;BSC/3in1      Prior Function Prior Level of Function : Needs assist             Mobility Comments: household ambulator with Rollator (reports he walks with Rollator locked as this feels more stable than using RW d/t L sided weakness from CVA). Reports last fall was a few months ago ADLs Comments: Reports able to complete ADLs without assist typically, uses tub bench for showering tasks. Wife completes IADLs     Hand Dominance   Dominant Hand: Right    Extremity/Trunk Assessment   Upper Extremity Assessment Upper Extremity Assessment: Defer to  OT evaluation    Lower Extremity Assessment Lower Extremity Assessment: RLE deficits/detail RLE Deficits / Details: Unable to detect touch with eyes closed distally; limited knee extension AROM to about -10 degrees against gravity; limited gross strength and ROM at knee and ankle with ACE wrapping RLE Sensation: decreased light touch    Cervical / Trunk Assessment Cervical / Trunk Assessment: Kyphotic;Other exceptions Cervical / Trunk Exceptions: reports hx of lumbar fx a few months ago  Communication   Communication: No difficulties  Cognition Arousal/Alertness: Awake/alert Behavior During Therapy: WFL for tasks assessed/performed, Anxious Overall Cognitive Status: Impaired/Different from baseline Area of Impairment: Attention, Following commands, Safety/judgement, Awareness, Problem solving                   Current Attention Level: Selective   Following Commands: Follows one step commands with increased time Safety/Judgement: Decreased awareness of deficits, Decreased awareness of safety Awareness: Emergent Problem Solving: Difficulty sequencing, Requires verbal cues, Requires tactile cues, Decreased initiation, Slow processing General Comments: Pt intermittently mildly anxious with techniques used and cues provided, particular about assist given. Pt stating "alright you need to do it" and "alright hold me up" when cued to stand. Needs extra time and multi-modal cues to sequence tasks        General Comments      Exercises     Assessment/Plan  PT Assessment Patient needs continued PT services  PT Problem List Decreased strength;Decreased range of motion;Decreased activity tolerance;Decreased balance;Decreased mobility;Decreased cognition;Decreased knowledge of use of DME;Decreased safety awareness;Impaired sensation;Pain       PT Treatment Interventions DME instruction;Stair training;Gait training;Functional mobility training;Therapeutic activities;Therapeutic  exercise;Balance training;Neuromuscular re-education;Patient/family education;Cognitive remediation;Wheelchair mobility training    PT Goals (Current goals can be found in the Care Plan section)  Acute Rehab PT Goals Patient Stated Goal: to get better PT Goal Formulation: With patient Time For Goal Achievement: 12/21/21 Potential to Achieve Goals: Good    Frequency Min 3X/week     Co-evaluation               AM-PAC PT "6 Clicks" Mobility  Outcome Measure Help needed turning from your back to your side while in a flat bed without using bedrails?: A Little Help needed moving from lying on your back to sitting on the side of a flat bed without using bedrails?: A Lot Help needed moving to and from a bed to a chair (including a wheelchair)?: Total Help needed standing up from a chair using your arms (e.g., wheelchair or bedside chair)?: Total Help needed to walk in hospital room?: Total Help needed climbing 3-5 steps with a railing? : Total 6 Click Score: 9    End of Session Equipment Utilized During Treatment: Gait belt Activity Tolerance: Patient tolerated treatment well Patient left: in bed;with call bell/phone within reach;with bed alarm set Nurse Communication: Mobility status PT Visit Diagnosis: Unsteadiness on feet (R26.81);Muscle weakness (generalized) (M62.81);History of falling (Z91.81);Difficulty in walking, not elsewhere classified (R26.2);Pain Pain - Right/Left: Left Pain - part of body: Ankle and joints of foot    Time: 1740-1819 PT Time Calculation (min) (ACUTE ONLY): 39 min   Charges:   PT Evaluation $PT Eval Moderate Complexity: 1 Mod PT Treatments $Therapeutic Activity: 23-37 mins        Moishe Spice, PT, DPT Acute Rehabilitation Services  Pager: [phone] Office: [phone]   Orvan Falconer 12/07/2021, 6:35 PM

## 2021-12-07 NOTE — Evaluation (Signed)
Occupational Therapy Evaluation Patient Details Name: Jose Bush MRN: [id number] DOB: April 21, 1941 Today's Date: 12/07/2021   History of Present Illness Pt is an 81 y/o male who presented for evaluation of L 1st and 2nd toe ulceration with hx of PVD. Pt underwent L popliteal artery to posterior tibial artery bypass and partial L second toe amputation. PMH: R BKA, a fib, BPH, PAD, PE, CVA, shingles, GERD, aortic stenosis.   Clinical Impression   PTA, pt lives with spouse, typically ambulatory with Rollator and R prosthetic LE, and reports Modified Independence with ADLs. Pt presents now with diagnoses above and deficits in standing balance, safety awareness, strength, and L LE pain. Pt with expected balance deficits with trial of darco shoe and difficulty keeping weight on heel. Pt requires Min A for UB ADLs and up to Max A x 2 for LB ADLs completed in standing. Pt insistent to attempt standing with Rollator though unable to safely attempt steps due to posterior lean and difficulty achieving upright posture. Ultimately, pt required Max A x 2 for squat pivot to recliner chair for completion of remainder of bathing task seated at sink with NT. Plan to trial Stedy in next session to assess for postural correction abilities for safe transfer/LB ADL completion. Pt feels he may be able to progress home despite current deficits. Wife interested in CIR - feel pt could progress well with CIR level therapies and wife able to provide light assist at DC.       Recommendations for follow up therapy are one component of a multi-disciplinary discharge planning process, led by the attending physician.  Recommendations may be updated based on patient status, additional functional criteria and insurance authorization.   Follow Up Recommendations  Acute inpatient rehab (3hours/day)    Assistance Recommended at Discharge Frequent or constant Supervision/Assistance  Patient can return home with the following Two  people to help with walking and/or transfers;Two people to help with bathing/dressing/bathroom    Functional Status Assessment  Patient has had a recent decline in their functional status and demonstrates the ability to make significant improvements in function in a reasonable and predictable amount of time.  Equipment Recommendations  Other (comment) (TBD; pending progress)    Recommendations for Other Services Rehab consult     Precautions / Restrictions Precautions Precautions: Fall;Other (comment) Precaution Comments: hx of R BKA with prosthetic Required Braces or Orthoses: Other Brace Other Brace: L darco shoe Restrictions Weight Bearing Restrictions: Yes LLE Weight Bearing: Partial weight bearing Other Position/Activity Restrictions: WB through heel      Mobility Bed Mobility Overal bed mobility: Needs Assistance Bed Mobility: Supine to Sit     Supine to sit: Mod assist, HOB elevated     General bed mobility comments: assist to lift trunk, increased time to negotiate LEs to EOB but able to do so using bedrail    Transfers Overall transfer level: Needs assistance Equipment used: Rolling walker (2 wheels), Rollator (4 wheels), 2 person hand held assist Transfers: Sit to/from Stand, Bed to chair/wheelchair/BSC Sit to Stand: Max assist, +2 physical assistance, +2 safety/equipment, From elevated surface   Squat pivot transfers: Max assist, +2 physical assistance, +2 safety/equipment       General transfer comment: Max A x 2 to stand at bedside with locked Rollator, unable to gain balance with posterior lean and unable to take steps with tendency to rock towards toes on L side with darco shoe (impacting balance with higher heel on shoe). Pt/wife wanted to try with  RW but similiar effect of posterior lean and trunk forward flexed. Opted for squat pivot to recliner with cues for safe sequencing (pt wanted to have both UE on armrests facing forward on recliner)       Balance Overall balance assessment: Needs assistance Sitting-balance support: No upper extremity supported, Feet supported Sitting balance-Leahy Scale: Fair     Standing balance support: Bilateral upper extremity supported, During functional activity Standing balance-Leahy Scale: Poor                             ADL either performed or assessed with clinical judgement   ADL Overall ADL's : Needs assistance/impaired Eating/Feeding: Set up;Sitting   Grooming: Set up;Sitting   Upper Body Bathing: Minimal assistance;Sitting   Lower Body Bathing: Maximal assistance;Sitting/lateral leans;Sit to/from stand;+2 for physical assistance;+2 for safety/equipment Lower Body Bathing Details (indicate cue type and reason): NT assisting with peri care in standing while OT assisted with pt balance Upper Body Dressing : Minimal assistance;Sitting   Lower Body Dressing: Maximal assistance;+2 for physical assistance;+2 for safety/equipment;Sit to/from stand;Sitting/lateral leans Lower Body Dressing Details (indicate cue type and reason): if in standing, needs +2 assist. Increased time to don prosthetic and darco shoe sitting EOB. Wife jumping in to assist pt as well Toilet Transfer: Maximal assistance;+2 for physical assistance;+2 for safety/equipment;BSC/3in1 Toilet Transfer Details (indicate cue type and reason): Assisted with donning brief sitting EOB and standing Toileting- Clothing Manipulation and Hygiene: Maximal assistance;+2 for physical assistance;+2 for safety/equipment;Sitting/lateral lean;Sit to/from stand         General ADL Comments: Pt with new balance deficits due to recent sx with darco shoe recommended for heel WB, with expected difficulty safely standing and completing dynamic tasks. Some decreased insight into deficits, safe sequencing of tasks and the current assist level needed that wife unable to provide     Vision Baseline Vision/History: 1 Wears glasses Ability  to See in Adequate Light: 1 Impaired Patient Visual Report: No change from baseline Vision Assessment?: No apparent visual deficits     Perception     Praxis      Pertinent Vitals/Pain Pain Assessment Pain Assessment: No/denies pain Pain Score: 5  Pain Location: L foot Pain Descriptors / Indicators: Grimacing, Guarding Pain Intervention(s): Monitored during session, Limited activity within patient's tolerance, Premedicated before session     Hand Dominance Right   Extremity/Trunk Assessment Upper Extremity Assessment Upper Extremity Assessment: LUE deficits/detail LUE Deficits / Details: hx of CVA with residual weakness, able to use functionally   Lower Extremity Assessment Lower Extremity Assessment: Defer to PT evaluation   Cervical / Trunk Assessment Cervical / Trunk Assessment: Kyphotic;Other exceptions Cervical / Trunk Exceptions: reports hx of lumbar fx a few months ago   Communication Communication Communication: No difficulties   Cognition Arousal/Alertness: Awake/alert Behavior During Therapy: WFL for tasks assessed/performed Overall Cognitive Status: Impaired/Different from baseline Area of Impairment: Attention, Following commands, Safety/judgement, Awareness, Problem solving                   Current Attention Level: Selective   Following Commands: Follows one step commands with increased time Safety/Judgement: Decreased awareness of deficits, Decreased awareness of safety Awareness: Emergent Problem Solving: Difficulty sequencing, Requires verbal cues, Requires tactile cues General Comments: Pt A&Ox4, appropriate in conversation though particular about assist given and strategies to trial (adamant that only locked Rollator would work for him in standing). Decreased awareness of deficits, feels he would do fine once  at home and can quickly regain strength to go home despite +2 assist needed today. Some difficulty with motor planning and correcting  posture requiring tactile cues     General Comments  VSS on RA. [name] present, jumps in to assist pt    Exercises     Shoulder Instructions      Home Living Family/patient expects to be discharged to:: Private residence Living Arrangements: Spouse/significant other Available Help at Discharge: Family Type of Home: House Home Access: Ramped entrance     Home Layout: One level     Bathroom Shower/Tub: Chief Strategy Officer: Handicapped height (toilet riser)     Home Equipment: Rollator (4 wheels);Wheelchair - manual;Electric scooter;Hospital bed;Tub bench;BSC/3in1          Prior Functioning/Environment Prior Level of Function : Needs assist             Mobility Comments: household ambulator with Rollator (reports he walks with Rollator locked as this feels more stable than using RW d/t L sided weakness from CVA). Reports last fall was a few months ago ADLs Comments: Reports able to complete ADLs without assist typically, uses tub bench for showering tasks. Wife completes IADLs        OT Problem List: Decreased strength;Decreased activity tolerance;Impaired balance (sitting and/or standing);Decreased cognition;Decreased safety awareness;Decreased knowledge of use of DME or AE;Decreased knowledge of precautions;Pain      OT Treatment/Interventions: Self-care/ADL training;Therapeutic exercise;DME and/or AE instruction;Energy conservation;Therapeutic activities;Patient/family education;Balance training    OT Goals(Current goals can be found in the care plan section) Acute Rehab OT Goals Patient Stated Goal: pt would like to go home, pt wife expressed interest in CIR OT Goal Formulation: With patient/family Time For Goal Achievement: 12/21/21 Potential to Achieve Goals: Good  OT Frequency: Min 2X/week    Co-evaluation              AM-PAC OT "6 Clicks" Daily Activity     Outcome Measure Help from another person eating meals?: A Little Help  from another person taking care of personal grooming?: A Little Help from another person toileting, which includes using toliet, bedpan, or urinal?: A Lot Help from another person bathing (including washing, rinsing, drying)?: A Lot Help from another person to put on and taking off regular upper body clothing?: A Little Help from another person to put on and taking off regular lower body clothing?: A Lot 6 Click Score: 15   End of Session Equipment Utilized During Treatment: Gait belt;Rolling walker (2 wheels);Rollator (4 wheels) Nurse Communication: Mobility status  Activity Tolerance: Patient tolerated treatment well Patient left: in chair;with nursing/sitter in room;with family/visitor present;Other (comment) (seated in recliner at sink with NT for bath)  OT Visit Diagnosis: Unsteadiness on feet (R26.81);Other abnormalities of gait and mobility (R26.89);Muscle weakness (generalized) (M62.81)                Time: 7371-0626 OT Time Calculation (min): 34 min Charges:  OT General Charges $OT Visit: 1 Visit OT Evaluation $OT Eval Moderate Complexity: 1 Mod OT Treatments $Self Care/Home Management : 8-22 mins  Bradd Canary, OTR/L Acute Rehab Services Office: [phone]   Lorre Munroe 12/07/2021, 11:22 AM

## 2021-12-07 NOTE — Progress Notes (Signed)
PT Cancellation Note ? ?Patient Details ?Name: Jose Bush ?MRN: [id number] ?DOB: [birth year] ? ? ?Cancelled Treatment:    Reason Eval/Treat Not Completed: Other (comment). Upon PT arrival, pt reports he just got back to bed and was just starting to eat lunch. Will plan to follow-up later this afternoon as time permits. ? ? ?Jose Bush, PT, DPT ?Acute Rehabilitation Services  ?Pager: [phone] ?Office: [phone] ? ? ? ?[name] ?12/07/2021, 12:17 PM ? ? ?

## 2021-12-07 NOTE — Progress Notes (Addendum)
?  Progress Note ? ? ? ?12/07/2021 ?6:55 AM ?1 Day Post-Op ? ?Subjective:  says his foot feels better ? ?Afebrile ?HR 80's-100's NSR ?[id number] systolic ?0000000 RA ? ?Vitals:  ? 12/07/21 0200 12/07/21 0345  ?BP: 140/70 (!) 124/58  ?Pulse: 93 82  ?Resp: 18 17  ?Temp:  98.4 ?F (36.9 ?C)  ?SpO2: 96% 94%  ? ? ?Physical Exam: ?Cardiac:  regular ?Lungs:  non labored ?Incisions:  all incisions look good.  Toe amp site is clean ?Extremities:  +doppler signal on dorsum of foot and PT ? ? ?CBC ?   ?Component Value Date/Time  ? WBC 10.4 12/07/2021 0224  ? RBC 3.70 (L) 12/07/2021 0224  ? HGB 10.2 (L) 12/07/2021 0224  ? HCT 32.1 (L) 12/07/2021 0224  ? PLT 240 12/07/2021 0224  ? MCV 86.8 12/07/2021 0224  ? MCH 27.6 12/07/2021 0224  ? MCHC 31.8 12/07/2021 0224  ? RDW 15.4 12/07/2021 0224  ? LYMPHSABS 1.4 04/02/2018 1533  ? MONOABS 0.8 04/02/2018 1533  ? EOSABS 0.6 04/02/2018 1533  ? BASOSABS 0.0 04/02/2018 1533  ? ? ?BMET ?   ?Component Value Date/Time  ? NA 138 12/07/2021 0224  ? K 3.6 12/07/2021 0224  ? CL 105 12/07/2021 0224  ? CO2 25 12/07/2021 0224  ? GLUCOSE 117 (H) 12/07/2021 0224  ? BUN 12 12/07/2021 0224  ? CREATININE 0.73 12/07/2021 0224  ? CALCIUM 8.6 (L) 12/07/2021 0224  ? GFRNONAA >60 12/07/2021 0224  ? GFRAA >60 08/10/2019 1536  ? ? ?INR ?   ?Component Value Date/Time  ? INR 1.3 (H) 12/06/2021 0730  ? ? ? ?Intake/Output Summary (Last 24 hours) at 12/07/2021 0655 ?Last data filed at 12/07/2021 0350 ?Gross per 24 hour  ?Intake 1900 ml  ?Output 3175 ml  ?Net -1275 ml  ? ? ? ?Assessment/Plan:  81 y.o. male is s/p:  ?1) left greater saphenous vein harvest ?2) creation of left posterior tibial artery and vein arteriovenous fistula ?3) left below knee popliteal artery to left posterior tibial artery vein fistula bypass ?4) left second toe partial amputation  ?1 Day Post-Op ? ? ?-pt's pain subjectively better in left foot ?-will order darco shoe for ambulation for heel weight bearing only ?-work with PT/OT today. ?-DVT prophylaxis:   heparin gtt ? ? ?Leontine Locket, PA-C ?[facility] ?[phone] ?12/07/2021 ?6:55 AM ? ?VASCULAR STAFF ADDENDUM: ?I have independently interviewed and examined the patient. ?I agree with the above.  ?Doing well POD#1 popliteal to PT vein / artery fistula to promote arterialization of right foot (deep vein arterialization). ?Foot pink and hyperemic. Foot feels better. ?Mobilize as able. ?Increase heparin to therapeutic dose.   ?I again counseled him that there are no further options for revascularization. If he does not heal his foot he will need above knee amputation. ? ?Yevonne Aline. Stanford Breed, MD ?[facility] ?Office Phone Number: [phone] ?12/07/2021 3:48 PM ? ? ? ?

## 2021-12-07 NOTE — Progress Notes (Signed)
ANTICOAGULATION CONSULT NOTE  ? ?Pharmacy Consult for heparin ?Indication: DVT and stroke, s/p vascular surgery  ? ?Allergies  ?Allergen Reactions  ? Sulfa Antibiotics   ?  Weak and dizziness  ? ? ?Patient Measurements: ?Height: 5\' 8"  (172.7 cm) ?Weight: 90.3 kg (199 lb 1.2 oz) ?IBW/kg (Calculated) : 68.4 ?Heparin Dosing Weight: 87kg ? ?Vital Signs: ?Temp: 98.7 ?F (37.1 ?C) (03/09 0000) ?Temp Source: Oral (03/09 0000) ?BP: 140/70 (03/09 0200) ?Pulse Rate: 93 (03/09 0200) ? ?Labs: ?Recent Labs  ?  12/06/21 ?0730 12/06/21 ?1155 12/07/21 ?0224  ?HGB 10.6* 10.5* 10.2*  ?HCT 33.6* 31.0* 32.1*  ?PLT 218  --  240  ?APTT 29  --   --   ?LABPROT 15.8*  --   --   ?INR 1.3*  --   --   ?HEPARINUNFRC  --   --  <0.10*  ?CREATININE 0.62  --  0.73  ? ? ? ?Estimated Creatinine Clearance: 79.1 mL/min (by C-G formula based on SCr of 0.73 mg/dL). ? ? ?Medical History: ?Past Medical History:  ?Diagnosis Date  ? Aortic stenosis   ? Aortic sclerosis without stenosis on echo 8/21  ? Arthritis   ? Atrial fibrillation ([place])   ? BPH (benign prostatic hyperplasia)   ? Essential hypertension   ? GERD (gastroesophageal reflux disease)   ? Heart murmur   ? History of DVT (deep vein thrombosis)   ? Positive anticardiolipin  ? History of hiatal hernia   ? History of stroke 2010  ? Lupus ([place])   ? PAD (peripheral artery disease) ([place])   ? Right below-knee amputation in 2019  ? Pulmonary embolism ([place])   ? hx of 10 years ago  ? Shingles   ? Stroke [facility])   ? Tinnitus   ? Wears dentures   ? Wears glasses   ? ? ?Medications:  ?Medications Prior to Admission  ?Medication Sig Dispense Refill Last Dose  ? acetaminophen (TYLENOL) 650 MG CR tablet Take 650 mg by mouth in the morning and at bedtime.   12/05/2021  ? Bioflavonoid Products (ESTER C PO) Take 500 mg by mouth daily as needed (immune support/health).   Past Week  ? docusate sodium (COLACE) 100 MG capsule Take 100 mg by mouth in the morning.   12/05/2021  ? hydroxychloroquine (PLAQUENIL) 200 MG tablet  Take 200 mg by mouth in the morning.   12/05/2021  ? Krill Oil 500 MG CAPS Take 500 mg by mouth in the morning.   12/05/2021  ? lisinopril-hydrochlorothiazide (PRINZIDE,ZESTORETIC) 10-12.5 MG per tablet Take 1 tablet by mouth daily with breakfast.    12/05/2021  ? loratadine (CLARITIN) 10 MG tablet Take 10 mg by mouth in the morning.   12/05/2021  ? melatonin 5 MG TABS Take 10 mg by mouth at bedtime.   12/05/2021  ? Menthol, Topical Analgesic, (ICY HOT EX) Apply 1 application topically 3 (three) times daily as needed (pain.).   Past Week  ? omeprazole (PRILOSEC) 20 MG capsule Take 20 mg by mouth daily.   12/05/2021  ? predniSONE (DELTASONE) 5 MG tablet Take 5 mg by mouth daily with breakfast.   12/05/2021  ? solifenacin (VESICARE) 10 MG tablet Take 1 tablet (10 mg total) by mouth daily. 90 tablet 3 12/05/2021  ? traMADol (ULTRAM) 50 MG tablet Take 50 mg by mouth 2 (two) times daily as needed for pain.   Past Week  ? warfarin (COUMADIN) 4 MG tablet Take 4 mg by mouth every evening.   12/01/2021  ? ?  Scheduled:  ? docusate sodium  100 mg Oral Daily  ? lisinopril  10 mg Oral Daily  ? And  ? hydrochlorothiazide  12.5 mg Oral Daily  ? hydroxychloroquine  200 mg Oral q AM  ? melatonin  10 mg Oral QHS  ? pantoprazole  40 mg Oral Daily  ? ?Infusions:  ? sodium chloride    ? sodium chloride Stopped (12/06/21 1738)  ?  ceFAZolin (ANCEF) IV 2 g (12/06/21 2206)  ? heparin 500 Units/hr (12/06/21 1810)  ? magnesium sulfate bolus IVPB    ? ? ?Assessment: ?Pt was on coumadin PTA for a hx PAD/CVA/DVT. His baseline INR 1.3 after holding. S/p tibial artery vein fistula bypass with left toe amputation. Plan to start low dose heparin tonight per [facility]VS.  ? ?3/9 AM update:  ?Heparin level low ?Hgb stable post-op ? ?Goal of Therapy:  ?Heparin level 0.3-0.5 units/ml ?Monitor platelets by anticoagulation protocol: Yes ?  ?Plan:  ?Inc heparin to 800 units/hr ?1200 heparin level ?F/U warfarin plans ? ?Narda Bonds, PharmD, BCPS ?Clinical Pharmacist ?Phone:  [phone] ? ? ? ? ?

## 2021-12-07 NOTE — Progress Notes (Signed)
OT Cancellation Note ? ?Patient Details ?Name: Jose Bush ?MRN: [id number] ?DOB: 1941/05/24 ? ? ?Cancelled Treatment:    Reason Eval/Treat Not Completed: Other (comment) Pt with new toe amputation, awaiting darco shoe prior to mobilizing pt to protect LE wound. Vascular PA aware, placing orders for darco shoe. Will follow-up once orthotic device delivered.  ? ?Lorre Munroe ?12/07/2021, 7:56 AM ?

## 2021-12-07 NOTE — Progress Notes (Addendum)
ANTICOAGULATION CONSULT NOTE  ? ?Pharmacy Consult for heparin ?Indication: DVT and stroke, s/p vascular surgery  ? ?Allergies  ?Allergen Reactions  ? Sulfa Antibiotics   ?  Weak and dizziness  ? ? ?Patient Measurements: ?Height: 5\' 8"  (172.7 cm) ?Weight: 90.3 kg (199 lb 1.2 oz) ?IBW/kg (Calculated) : 68.4 ?Heparin Dosing Weight: 87kg ? ?Vital Signs: ?Temp: 99.2 ?F (37.3 ?C) (03/09 1930) ?Temp Source: Oral (03/09 1930) ?BP: 149/67 (03/09 1930) ?Pulse Rate: 100 (03/09 1930) ? ?Labs: ?Recent Labs  ?  12/06/21 ?0730 12/06/21 ?1155 12/07/21 ?0224 12/07/21 ?1130 12/07/21 ?2010  ?HGB 10.6* 10.5* 10.2*  --   --   ?HCT 33.6* 31.0* 32.1*  --   --   ?PLT 218  --  240  --   --   ?APTT 29  --   --   --   --   ?LABPROT 15.8*  --   --   --   --   ?INR 1.3*  --   --   --   --   ?HEPARINUNFRC  --   --  <0.10* <0.10* <0.10*  ?CREATININE 0.62  --  0.73  --   --   ? ? ?Estimated Creatinine Clearance: 79.1 mL/min (by C-G formula based on SCr of 0.73 mg/dL). ? ? ?Medical History: ?Past Medical History:  ?Diagnosis Date  ? Aortic stenosis   ? Aortic sclerosis without stenosis on echo 8/21  ? Arthritis   ? Atrial fibrillation ([facility])   ? BPH (benign prostatic hyperplasia)   ? Essential hypertension   ? GERD (gastroesophageal reflux disease)   ? Heart murmur   ? History of DVT (deep vein thrombosis)   ? Positive anticardiolipin  ? History of hiatal hernia   ? History of stroke 2010  ? Lupus ([facility])   ? PAD (peripheral artery disease) ([facility])   ? Right below-knee amputation in 2019  ? Pulmonary embolism ([facility])   ? hx of 10 years ago  ? Shingles   ? Stroke [facility])   ? Tinnitus   ? Wears dentures   ? Wears glasses   ? ? ?Assessment: ?Pt was on coumadin PTA for a hx PAD/CVA/DVT. His baseline INR 1.3 after holding. S/p tibial artery vein fistula bypass with left toe amputation. Pharmacy consulted for heparin with low goal.  ? ?Heparin level < 0.1 is subtherapeutic on 800 units/hr. Heparin running in R arm, level drawn from L arm.  No issues with infusion  or bleeding per RN. ? ? ?Goal of Therapy:  ?Heparin level 0.3-0.5 units/ml ?Monitor platelets by anticoagulation protocol: Yes ?  ?Plan:  ?Inc heparin to 1050 units/hr, no bolus  ?F/u 6 hr level  ?Monitor daily heparin level, CBC ?Monitor for signs/symptoms of bleeding  ? ?ADDENDUM 20:45: Heparin level < 0.1 is still undetectable and subtherapeutic on 1050 units/hr (= 12 units/kg/hr), which is within expectations given the relatively low rate.  No issues with infusion or bleeding per RN. Heparin is now running in L arm and level was drawn from R wrist. Will increase to 1300 units/hr and check 8hr level.  ? ? ? ?[facility], PharmD, BCPS, BCCP ?Clinical Pharmacist ? ?Please check AMION for all [facility] Pharmacy phone numbers ?After 10:00 PM, call Main Pharmacy [phone] ? ?

## 2021-12-07 NOTE — Progress Notes (Signed)
Mobility Specialist Progress Note ? ? 12/07/21 1213  ?Mobility  ?Activity Transferred from chair to bed  ?Level of Assistance Moderate assist, patient does 50-74%  ?Assistive Device Sliding board  ?LLE Weight Bearing PWB  ?Activity Response Tolerated well  ?$Mobility charge 1 Mobility  ? ?Received pt requesting to get back in bed w/ present pain(6/10) in L toe. Lateral scoot w/ sliding board, pt requiring mod A d/t gen weakness. Able to doff prosthetic while sitting on EOB w/ no assist. Left in bed w/ needs met and RN notified about pain.  ? ?  ?Mobility Specialist ?Phone Number [phone] ? ?

## 2021-12-07 NOTE — Progress Notes (Signed)
Fresh bright red blood noted on new dressing to L 2nd toe.  Pt states it started after getting with therapy.  Dressing not saturated.  Reinforced at this time.  Will cont to monitor.   ?

## 2021-12-07 NOTE — Progress Notes (Signed)
Orthopedic Tech Progress Note ?Patient Details:  ?Jose Bush ?02-25-1941 ?[id number] ? ?Ortho Devices ?Type of Ortho Device: Darco shoe ?Ortho Device/Splint Location: LLE ?Ortho Device/Splint Interventions: Ordered ?  ?  ? ?Camarion Weier A Ameenah Prosser ?12/07/2021, 8:26 AM ? ?

## 2021-12-08 LAB — CBC
HCT: 31.6 % — ABNORMAL LOW (ref 39.0–52.0)
Hemoglobin: 10 g/dL — ABNORMAL LOW (ref 13.0–17.0)
MCH: 27.6 pg (ref 26.0–34.0)
MCHC: 31.6 g/dL (ref 30.0–36.0)
MCV: 87.3 fL (ref 80.0–100.0)
Platelets: 230 10*3/uL (ref 150–400)
RBC: 3.62 MIL/uL — ABNORMAL LOW (ref 4.22–5.81)
RDW: 15.6 % — ABNORMAL HIGH (ref 11.5–15.5)
WBC: 9.2 10*3/uL (ref 4.0–10.5)
nRBC: 0 % (ref 0.0–0.2)

## 2021-12-08 LAB — HEPARIN LEVEL (UNFRACTIONATED)
Heparin Unfractionated: 0.31 IU/mL (ref 0.30–0.70)
Heparin Unfractionated: 0.19 IU/mL — ABNORMAL LOW (ref 0.30–0.70)
Heparin Unfractionated: 0.22 IU/mL — ABNORMAL LOW (ref 0.30–0.70)

## 2021-12-08 MED ORDER — POLYETHYLENE GLYCOL 3350 17 G PO PACK
17.0000 g | PACK | Freq: Every day | ORAL | Status: DC
  Administered 2021-12-08 – 2021-12-20 (×12): 17 g via ORAL
  Filled 2021-12-08 (×11): qty 1

## 2021-12-08 NOTE — Care Management Important Message (Signed)
Important Message ? ?Patient Details  ?Name: Jose Bush ?MRN: [id number] ?Date of Birth: 1941-02-18 ? ? ?Medicare Important Message Given:  Yes ? ? ? ? ?[name] ?12/08/2021, 10:08 AM ?

## 2021-12-08 NOTE — Progress Notes (Signed)
?  Progress Note ? ? ? ?12/08/2021 ?6:52 AM ?2 Days Post-Op ? ?Subjective:  says his leg feels a little tight but foot still feels better ? ?Tm 99.5 now afebrile ?HR 70's-100's  ?[id number] systolic ?[id number] ? ?Vitals:  ? 12/07/21 2326 12/08/21 0333  ?BP: 135/63 140/81  ?Pulse: 90 81  ?Resp: 20 16  ?Temp: 99.5 ?F (37.5 ?C) 98.6 ?F (37 ?C)  ?SpO2: 98% 98%  ? ? ?Physical Exam: ?Cardiac:  regular ?Lungs:  non labored ?Incisions:  all incisions look good ?Extremities:  +DP doppler signal; +PT doppler signal but sounds water hammer  ? ? ?CBC ?   ?Component Value Date/Time  ? WBC 9.2 12/08/2021 0430  ? RBC 3.62 (L) 12/08/2021 0430  ? HGB 10.0 (L) 12/08/2021 0430  ? HCT 31.6 (L) 12/08/2021 0430  ? PLT 230 12/08/2021 0430  ? MCV 87.3 12/08/2021 0430  ? MCH 27.6 12/08/2021 0430  ? MCHC 31.6 12/08/2021 0430  ? RDW 15.6 (H) 12/08/2021 0430  ? LYMPHSABS 1.4 04/02/2018 1533  ? MONOABS 0.8 04/02/2018 1533  ? EOSABS 0.6 04/02/2018 1533  ? BASOSABS 0.0 04/02/2018 1533  ? ? ?BMET ?   ?Component Value Date/Time  ? NA 138 12/07/2021 0224  ? K 3.6 12/07/2021 0224  ? CL 105 12/07/2021 0224  ? CO2 25 12/07/2021 0224  ? GLUCOSE 117 (H) 12/07/2021 0224  ? BUN 12 12/07/2021 0224  ? CREATININE 0.73 12/07/2021 0224  ? CALCIUM 8.6 (L) 12/07/2021 0224  ? GFRNONAA >60 12/07/2021 0224  ? GFRAA >60 08/10/2019 1536  ? ? ?INR ?   ?Component Value Date/Time  ? INR 1.3 (H) 12/06/2021 0730  ? ? ? ?Intake/Output Summary (Last 24 hours) at 12/08/2021 [id number] ?Last data filed at 12/08/2021 0335 ?Gross per 24 hour  ?Intake 360 ml  ?Output 1700 ml  ?Net -1340 ml  ? ? ? ?Assessment/Plan:  81 y.o. male is s/p:  ?1) left greater saphenous vein harvest ?2) creation of left posterior tibial artery and vein arteriovenous fistula ?3) left below knee popliteal artery to left posterior tibial artery vein fistula bypass ?4) left second toe partial amputation   ?2 Days Post-Op ? ? ?-pt with +DP doppler signal ?-has swelling in the LLE -leg wrapped back and will need to elevate  as tolerated.  ?-DVT prophylaxis:  heparin gtt ?-continue to work with PT/OT and mobilize out of bed.  May need flat post of shoe as he is having a hard time with balance. We will see how he does today. ? ? ?Leontine Locket, PA-C ?[facility] ?[id number] ?12/08/2021 ?6:52 AM ? ? ? ?

## 2021-12-08 NOTE — Progress Notes (Signed)
ANTICOAGULATION CONSULT NOTE  ? ?Pharmacy Consult for heparin ?Indication: DVT and stroke, s/p vascular surgery  ? ?Allergies  ?Allergen Reactions  ? Sulfa Antibiotics   ?  Weak and dizziness  ? ? ?Patient Measurements: ?Height: 5\' 8"  (172.7 cm) ?Weight: 90.3 kg (199 lb 1.2 oz) ?IBW/kg (Calculated) : 68.4 ?Heparin Dosing Weight: 87kg ? ?Vital Signs: ?Temp: 98.6 ?F (37 ?C) (03/10 [id number]) ?Temp Source: Oral (03/10 [id number]) ?BP: 140/81 (03/10 0333) ?Pulse Rate: 81 (03/10 0333) ? ?Labs: ?Recent Labs  ?  12/06/21 ?0730 12/06/21 ?1155 12/06/21 ?1155 12/07/21 ?0224 12/07/21 ?1130 12/07/21 ?2010 12/08/21 ?0430  ?HGB 10.6* 10.5*  --  10.2*  --   --  10.0*  ?HCT 33.6* 31.0*  --  32.1*  --   --  31.6*  ?PLT 218  --   --  240  --   --  230  ?APTT 29  --   --   --   --   --   --   ?LABPROT 15.8*  --   --   --   --   --   --   ?INR 1.3*  --   --   --   --   --   --   ?HEPARINUNFRC  --   --    < > <0.10* <0.10* <0.10* 0.31  ?CREATININE 0.62  --   --  0.73  --   --   --   ? < > = values in this interval not displayed.  ? ? ? ?Estimated Creatinine Clearance: 79.1 mL/min (by C-G formula based on SCr of 0.73 mg/dL). ? ? ?Medical History: ?Past Medical History:  ?Diagnosis Date  ? Aortic stenosis   ? Aortic sclerosis without stenosis on echo 8/21  ? Arthritis   ? Atrial fibrillation ([place])   ? BPH (benign prostatic hyperplasia)   ? Essential hypertension   ? GERD (gastroesophageal reflux disease)   ? Heart murmur   ? History of DVT (deep vein thrombosis)   ? Positive anticardiolipin  ? History of hiatal hernia   ? History of stroke 2010  ? Lupus ([place])   ? PAD (peripheral artery disease) ([place])   ? Right below-knee amputation in 2019  ? Pulmonary embolism ([place])   ? hx of 10 years ago  ? Shingles   ? Stroke [facility])   ? Tinnitus   ? Wears dentures   ? Wears glasses   ? ? ?Assessment: ?Pt was on coumadin PTA for a hx PAD/CVA/DVT. His baseline INR 1.3 after holding. S/p tibial artery vein fistula bypass with left toe amputation. Pharmacy consulted  for heparin with low goal.  ? ?Heparin level < 0.1 is subtherapeutic on 800 units/hr. Heparin running in R arm, level drawn from L arm.  No issues with infusion or bleeding per RN. ? ?3/10 AM update:  ?Heparin level therapeutic  ? ? ?Goal of Therapy:  ?Heparin level 0.3-0.5 units/ml ?Monitor platelets by anticoagulation protocol: Yes ?  ?Plan:  ?Cont heparin 1300 units/hr ?1400 heparin level ? ?Narda Bonds, PharmD, BCPS ?Clinical Pharmacist ?Phone: [phone] ? ?

## 2021-12-08 NOTE — Progress Notes (Signed)
ANTICOAGULATION CONSULT NOTE  ? ?Pharmacy Consult for heparin ?Indication: DVT and stroke, s/p vascular surgery  ? ?Allergies  ?Allergen Reactions  ? Sulfa Antibiotics   ?  Weak and dizziness  ? ? ?Patient Measurements: ?Height: 5\' 8"  (172.7 cm) ?Weight: 90.3 kg (199 lb 1.2 oz) ?IBW/kg (Calculated) : 68.4 ?Heparin Dosing Weight: 87kg ? ?Vital Signs: ?Temp: 98.7 ?F (37.1 ?C) (03/10 1130) ?Temp Source: Oral (03/10 1130) ?BP: 153/69 (03/10 1130) ?Pulse Rate: 96 (03/10 1130) ? ?Labs: ?Recent Labs  ?  12/06/21 ?0730 12/06/21 ?1155 12/07/21 ?0224 12/07/21 ?1130 12/07/21 ?2010 12/08/21 ?0430 12/08/21 ?1434  ?HGB 10.6* 10.5* 10.2*  --   --  10.0*  --   ?HCT 33.6* 31.0* 32.1*  --   --  31.6*  --   ?PLT 218  --  240  --   --  230  --   ?APTT 29  --   --   --   --   --   --   ?LABPROT 15.8*  --   --   --   --   --   --   ?INR 1.3*  --   --   --   --   --   --   ?HEPARINUNFRC  --   --  <0.10*   < > <0.10* 0.31 0.19*  ?CREATININE 0.62  --  0.73  --   --   --   --   ? < > = values in this interval not displayed.  ? ? ?Estimated Creatinine Clearance: 79.1 mL/min (by C-G formula based on SCr of 0.73 mg/dL). ? ? ?Medical History: ?Past Medical History:  ?Diagnosis Date  ? Aortic stenosis   ? Aortic sclerosis without stenosis on echo 8/21  ? Arthritis   ? Atrial fibrillation ([place])   ? BPH (benign prostatic hyperplasia)   ? Essential hypertension   ? GERD (gastroesophageal reflux disease)   ? Heart murmur   ? History of DVT (deep vein thrombosis)   ? Positive anticardiolipin  ? History of hiatal hernia   ? History of stroke 2010  ? Lupus ([place])   ? PAD (peripheral artery disease) ([place])   ? Right below-knee amputation in 2019  ? Pulmonary embolism ([place])   ? hx of 10 years ago  ? Shingles   ? Stroke [facility])   ? Tinnitus   ? Wears dentures   ? Wears glasses   ? ? ?Assessment: ?Pt was on coumadin PTA for a hx PAD/CVA/DVT. His baseline INR 1.3 after holding. S/p tibial artery vein fistula bypass with left toe amputation. Pharmacy consulted  for heparin with low goal.  ? ?Heparin level 0.19 is subtherapeutic on 1300 units/hr. Heparin running in L arm, level drawn from R arm.  No issues with infusion or bleeding per RN.  ?  ? ?Goal of Therapy:  ?Heparin level 0.3-0.5 units/ml ?Monitor platelets by anticoagulation protocol: Yes ?  ?Plan:  ?Increase heparin to 1450 units/hr ?F/u 8hr HL ?Monitor daily heparin level, CBC ?Monitor for signs/symptoms of bleeding  ? ? ?Benetta Spar, PharmD, BCPS, BCCP ?Clinical Pharmacist ? ?Please check AMION for all [place] phone numbers ?After 10:00 PM, call [place] [phone] ? ?

## 2021-12-08 NOTE — Progress Notes (Signed)
Inpatient Rehab Admissions Coordinator:  ? ?Per therapy recommendations pt was screened for CIR by Estill Dooms, PT, DPT. Unfortunately, [employer] Medicare unlikely to approve for CIR for this diagnosis.  Would recommend f/u with an alternative level of care (SNF vs HH) at therapy discretion.   ? ?Estill Dooms, PT, DPT ?Admissions Coordinator ?[phone] ?12/08/21  ?11:34 AM ? ?

## 2021-12-08 NOTE — Progress Notes (Signed)
Physical Therapy Treatment ?Patient Details ?Name: Jose Bush ?MRN: [id number] ?DOB: 1941-08-20 ?Today's Date: 12/08/2021 ? ? ?History of Present Illness Pt is an 81 y/o male who presented 12/06/21 for evaluation of L 1st and 2nd toe ulceration with hx of PVD. Pt underwent L popliteal artery to posterior tibial artery bypass and partial L second toe amputation 3/8 PWB on left heel.  PMH: R BKA, a fib, BPH, PAD, PE, CVA, shingles, GERD, aortic stenosis. ?  ?PT Comments  ? ? Patient received in bed, wife and son at bedside. Patient is motivated to improve and do things as independently as possible. Wife very helpful and supportive. Patient required increased time and effort with bed mobility, heavy use of bed rails, but only required min A for raising trunk to sitting position. He is able to stand from elevated bed with mod A +1-2. He was able to take a few pivoting steps from bed to recliner with min-mod A +2 and 4 wheeled walker. Patient requires increased time to take steps attempting to maintain PWB through heel on left. Patient will continue to benefit from skilled PT while here to improve functional independence, strength and safety for return home with family.  ?     ?Recommendations for follow up therapy are one component of a multi-disciplinary discharge planning process, led by the attending physician.  Recommendations may be updated based on patient status, additional functional criteria and insurance authorization. ? ?Follow Up Recommendations ? Acute inpatient rehab (3hours/day) ?  ?  ?Assistance Recommended at Discharge Frequent or constant Supervision/Assistance  ?Patient can return home with the following Two people to help with walking and/or transfers;Assistance with cooking/housework;Direct supervision/assist for medications management;Direct supervision/assist for financial management;Assist for transportation;Help with stairs or ramp for entrance;A lot of help with bathing/dressing/bathroom ?   ?Equipment Recommendations ? None recommended by PT (has a rollator at home, has hospital bed)  ?  ?Recommendations for Other Services Rehab consult ? ? ?  ?Precautions / Restrictions Precautions ?Precautions: Fall ?Precaution Comments: hx of R BKA with prosthetic ?Other Brace: L darco shoe ?Restrictions ?Weight Bearing Restrictions: Yes ?LLE Weight Bearing: Partial weight bearing ?Other Position/Activity Restrictions: WB through heel  ?  ? ?Mobility ? Bed Mobility ?Overal bed mobility: Needs Assistance ?Bed Mobility: Supine to Sit ?  ?  ?Supine to sit: Min assist, HOB elevated ?  ?  ?General bed mobility comments: Increased time, able and motivated to perform most bed mobility on his own. Required min A to raise trunk ?  ? ?Transfers ?Overall transfer level: Needs assistance ?Equipment used: Rollator (4 wheels) ?Transfers: Sit to/from Stand ?Sit to Stand: From elevated surface, Mod assist, +2 physical assistance ?  ?Step pivot transfers: Min assist, +2 physical assistance, +2 safety/equipment ?  ?  ?  ?General transfer comment: Mod assist +1-2 for taking pivoting steps from bed to recliner. Increased time needed. ?  ? ?Ambulation/Gait ?Ambulation/Gait assistance: Min assist, +2 physical assistance, +2 safety/equipment, Mod assist ?Gait Distance (Feet): 2 Feet ?Assistive device: Rollator (4 wheels) ?Gait Pattern/deviations: Step-to pattern, Decreased step length - right, Decreased step length - left, Decreased stride length ?Gait velocity: decr ?  ?  ?General Gait Details: pivoting steps from bed to recliner ? ? ?Stairs ?  ?  ?  ?  ?  ? ? ?Wheelchair Mobility ?  ? ?Modified Rankin (Stroke Patients Only) ?  ? ? ?  ?Balance Overall balance assessment: Needs assistance ?Sitting-balance support: Feet supported ?Sitting balance-Leahy Scale: Good ?Sitting balance -  Comments: able to don prosthetic sleeve independently in sitting at edge of bed, required min A from wife to get leg donned. ?  ?Standing balance support:  Bilateral upper extremity supported, During functional activity, Reliant on assistive device for balance ?Standing balance-Leahy Scale: Fair ?Standing balance comment: mod A +1-2 for standing from elevated bed. Min A +2 once balanced ?  ?  ?  ?  ?  ?  ?  ?  ?  ?  ?  ?  ? ?  ?Cognition Arousal/Alertness: Awake/alert ?Behavior During Therapy: [facility] for tasks assessed/performed ?Overall Cognitive Status: Within Functional Limits for tasks assessed ?  ?  ?  ?  ?  ?  ?  ?  ?  ?  ?  ?  ?Following Commands: Follows one step commands consistently ?  ?  ?Problem Solving: Requires verbal cues, Requires tactile cues ?General Comments: Patient requires increased time, self directive with mobility although requires cues for best way to move at this time. ?  ?  ? ?  ?Exercises   ? ?  ?General Comments   ?  ?  ? ?Pertinent Vitals/Pain Pain Assessment ?Pain Assessment: No/denies pain ?Pain Intervention(s): Monitored during session, Repositioned  ? ? ?Home Living   ?  ?  ?  ?  ?  ?  ?  ?  ?  ?   ?  ?Prior Function    ?  ?  ?   ? ?PT Goals (current goals can now be found in the care plan section) Acute Rehab PT Goals ?Patient Stated Goal: to get better, go home ?PT Goal Formulation: With patient/family ?Time For Goal Achievement: 12/21/21 ?Potential to Achieve Goals: Good ?Progress towards PT goals: Progressing toward goals ? ?  ?Frequency ? ? ? Min 3X/week ? ? ? ?  ?PT Plan Current plan remains appropriate  ? ? ?Co-evaluation   ?  ?  ?  ?  ? ?  ?AM-PAC PT "6 Clicks" Mobility   ?Outcome Measure ? Help needed turning from your back to your side while in a flat bed without using bedrails?: A Lot ?Help needed moving from lying on your back to sitting on the side of a flat bed without using bedrails?: A Lot ?Help needed moving to and from a bed to a chair (including a wheelchair)?: A Lot ?Help needed standing up from a chair using your arms (e.g., wheelchair or bedside chair)?: A Lot ?Help needed to walk in hospital room?: A Lot ?Help  needed climbing 3-5 steps with a railing? : Total ?6 Click Score: 11 ? ?  ?End of Session Equipment Utilized During Treatment: Gait belt ?Activity Tolerance: Patient tolerated treatment well ?Patient left: in chair;with call bell/phone within reach;with family/visitor present ?Nurse Communication: Mobility status ?PT Visit Diagnosis: Unsteadiness on feet (R26.81);Muscle weakness (generalized) (M62.81);History of falling (Z91.81);Difficulty in walking, not elsewhere classified (R26.2);Pain ?Pain - Right/Left: Left ?Pain - part of body: Ankle and joints of foot ?  ? ? ?Time: [id number] ?PT Time Calculation (min) (ACUTE ONLY): 33 min ? ?Charges:  $Therapeutic Activity: 23-37 mins          ?          ? ?Amanda Cockayne, PT, GCS ?12/08/21,12:10 PM ? ?

## 2021-12-09 LAB — CBC
HCT: 29.3 % — ABNORMAL LOW (ref 39.0–52.0)
Hemoglobin: 9.6 g/dL — ABNORMAL LOW (ref 13.0–17.0)
MCH: 28.3 pg (ref 26.0–34.0)
MCHC: 32.8 g/dL (ref 30.0–36.0)
MCV: 86.4 fL (ref 80.0–100.0)
Platelets: 229 10*3/uL (ref 150–400)
RBC: 3.39 MIL/uL — ABNORMAL LOW (ref 4.22–5.81)
RDW: 15.5 % (ref 11.5–15.5)
WBC: 7.4 10*3/uL (ref 4.0–10.5)
nRBC: 0 % (ref 0.0–0.2)

## 2021-12-09 LAB — PROTIME-INR
INR: 1.2 (ref 0.8–1.2)
Prothrombin Time: 15.1 seconds (ref 11.4–15.2)

## 2021-12-09 LAB — HEPARIN LEVEL (UNFRACTIONATED): Heparin Unfractionated: 0.27 IU/mL — ABNORMAL LOW (ref 0.30–0.70)

## 2021-12-09 MED ORDER — WARFARIN SODIUM 4 MG PO TABS
4.0000 mg | ORAL_TABLET | Freq: Once | ORAL | Status: AC
  Administered 2021-12-09: 4 mg via ORAL
  Filled 2021-12-09: qty 1

## 2021-12-09 MED ORDER — ENOXAPARIN SODIUM 100 MG/ML IJ SOSY
90.0000 mg | PREFILLED_SYRINGE | Freq: Two times a day (BID) | INTRAMUSCULAR | Status: DC
  Administered 2021-12-09 – 2021-12-10 (×2): 90 mg via SUBCUTANEOUS
  Filled 2021-12-09 (×4): qty 0.9

## 2021-12-09 MED ORDER — WARFARIN - PHARMACIST DOSING INPATIENT: Freq: Every day | Status: DC

## 2021-12-09 NOTE — Progress Notes (Signed)
Mobility Specialist Progress Note ? ? 12/09/21 1503  ?Mobility  ?Activity Transferred from chair to bed  ?Level of Assistance +2 (takes two people) ?(Mod A)  ?Assistive Device Sliding board  ?LLE Weight Bearing PWB  ?Activity Response Tolerated well  ?$Mobility charge 1 Mobility  ? ?Pt requesting to get back to bed after sitting up for ~4hrs. +2 mod Assist to lateral scoot w/ sliding board. No fault on tx, left in room w/ RN present.  ? ?Jose Bush ?Mobility Specialist ?Phone Number [phone] ? ?

## 2021-12-09 NOTE — Progress Notes (Signed)
Orthopedic Tech Progress Note ?Patient Details:  ?Jose Bush ?13-Sep-1941 ?[id number] ? ?Ortho Devices ?Type of Ortho Device: Postop shoe/boot ?Ortho Device/Splint Location: Left Foot ?Ortho Device/Splint Interventions: Application ?  ?Post Interventions ?Patient Tolerated: Well ? ?[name] E Sharise Lippy ?12/09/2021, 8:51 AM ? ?

## 2021-12-09 NOTE — Progress Notes (Signed)
Physical Therapy Treatment ?Patient Details ?Name: Jose Bush ?MRN: [id number] ?DOB: 07-Jul-1941 ?Today's Date: 12/09/2021 ? ? ?History of Present Illness Pt is an 81 y/o male who presented 12/06/21 for evaluation of L 1st and 2nd toe ulceration with hx of PVD. Pt underwent L popliteal artery to posterior tibial artery bypass and partial L second toe amputation 3/8 PWB on left heel.  PMH: R BKA, a fib, BPH, PAD, PE, CVA, shingles, GERD, aortic stenosis. ? ?  ?PT Comments  ? ? Pt making slow progress, limited by surgery/PWB and R LE prosthetics.  Emphasis on transitions, sit to stands at varying heights, progression of gait with stress on L LE PWB and education. ?   ?Recommendations for follow up therapy are one component of a multi-disciplinary discharge planning process, led by the attending physician.  Recommendations may be updated based on patient status, additional functional criteria and insurance authorization. ? ?Follow Up Recommendations ? Acute inpatient rehab (3hours/day) ?  ?  ?Assistance Recommended at Discharge Frequent or constant Supervision/Assistance  ?Patient can return home with the following Two people to help with walking and/or transfers;Assistance with cooking/housework;Direct supervision/assist for medications management;Direct supervision/assist for financial management;Assist for transportation;Help with stairs or ramp for entrance;A lot of help with bathing/dressing/bathroom ?  ?Equipment Recommendations ? None recommended by PT  ?  ?Recommendations for Other Services Rehab consult ? ? ?  ?Precautions / Restrictions Precautions ?Precautions: Fall ?Precaution Comments: hx of R BKA with prosthetic ?Required Braces or Orthoses:  (received ortho shoe) ?Other Brace:  (pt does not want to use [employer]) ?Restrictions ?LLE Weight Bearing: Partial weight bearing ?Other Position/Activity Restrictions: WB through heel  ?  ? ?Mobility ? Bed Mobility ?Overal bed mobility: Needs Assistance ?Bed Mobility:  Supine to Sit ?  ?  ?Supine to sit: Min assist, HOB elevated, Mod assist ?  ?  ?General bed mobility comments: Increased time, able and motivated to perform most bed mobility on his own. Required min A to raise trunk ?  ? ?Transfers ?Overall transfer level: Needs assistance ?Equipment used: Rolling walker (2 wheels) ?Transfers: Sit to/from Stand ?Sit to Stand: From elevated surface, Mod assist, +2 physical assistance (heavier mod from low surface) ?  ?  ?  ?  ?  ?General transfer comment: Mod assist +1-2 for taking pivoting steps from bed to recliner. Increased time needed. ?  ? ?Ambulation/Gait ?Ambulation/Gait assistance: Mod assist ?Gait Distance (Feet): 8 Feet (x2) ?Assistive device: Rollator (4 wheels) ?Gait Pattern/deviations: Step-to pattern ?Gait velocity: decr ?Gait velocity interpretation: <1.31 ft/sec, indicative of household ambulator ?  ?General Gait Details: staggered pattern with left foot continuously in front to maintain PWB.  Short, fairly labored steps, biased posteriorly, but generally easy to manage with 1 person until fatigue. ? ? ?Stairs ?  ?  ?  ?  ?  ? ? ?Wheelchair Mobility ?  ? ?Modified Rankin (Stroke Patients Only) ?  ? ? ?  ?Balance   ?Sitting-balance support: Feet supported ?Sitting balance-Leahy Scale: Good ?Sitting balance - Comments: pt complete full donning of prosthesis once items supplied to him at EOB ?  ?Standing balance support: Bilateral upper extremity supported, During functional activity, Reliant on assistive device for balance ?Standing balance-Leahy Scale: Poor ?Standing balance comment: pt reliant on AD and external support ?  ?  ?  ?  ?  ?  ?  ?  ?  ?  ?  ?  ? ?  ?Cognition Arousal/Alertness: Awake/alert ?Behavior During Therapy: [facility] for tasks assessed/performed ?  Overall Cognitive Status: Within Functional Limits for tasks assessed ?  ?  ?  ?  ?  ?  ?  ?  ?  ?  ?  ?  ?  ?  ?  ?  ?  ?  ?  ? ?  ?Exercises   ? ?  ?General Comments General comments (skin integrity, edema,  etc.): Wife present at end for discussion.  Wants AIR if possible.  Gave her info on PRIMOFIT system by request ?  ?  ? ?Pertinent Vitals/Pain Pain Assessment ?Pain Assessment: Faces ?Faces Pain Scale: Hurts a little bit ?Pain Location: L foot ?Pain Descriptors / Indicators: Guarding, Discomfort ?Pain Intervention(s): Monitored during session, Limited activity within patient's tolerance  ? ? ?Home Living   ?  ?  ?  ?  ?  ?  ?  ?  ?  ?   ?  ?Prior Function    ?  ?  ?   ? ?PT Goals (current goals can now be found in the care plan section) Acute Rehab PT Goals ?Patient Stated Goal: to get better, go home ?PT Goal Formulation: With patient/family ?Time For Goal Achievement: 12/21/21 ?Potential to Achieve Goals: Good ?Progress towards PT goals: Progressing toward goals ? ?  ?Frequency ? ? ? Min 3X/week ? ? ? ?  ?PT Plan Current plan remains appropriate  ? ? ?Co-evaluation   ?  ?  ?  ?  ? ?  ?AM-PAC PT "6 Clicks" Mobility   ?Outcome Measure ? Help needed turning from your back to your side while in a flat bed without using bedrails?: A Lot ?Help needed moving from lying on your back to sitting on the side of a flat bed without using bedrails?: A Lot ?Help needed moving to and from a bed to a chair (including a wheelchair)?: A Lot ?Help needed standing up from a chair using your arms (e.g., wheelchair or bedside chair)?: A Lot ?Help needed to walk in hospital room?: Total ?Help needed climbing 3-5 steps with a railing? : Total ?6 Click Score: 10 ? ?  ?End of Session   ?Activity Tolerance: Patient tolerated treatment well ?Patient left: in chair;with call bell/phone within reach;with family/visitor present ?Nurse Communication: Mobility status ?PT Visit Diagnosis: Unsteadiness on feet (R26.81);Muscle weakness (generalized) (M62.81);History of falling (Z91.81);Difficulty in walking, not elsewhere classified (R26.2);Pain ?Pain - Right/Left: Left ?Pain - part of body: Ankle and joints of foot ?  ? ? ?Time: [id number] ?PT Time  Calculation (min) (ACUTE ONLY): 56 min ? ?Charges:  $Gait Training: 23-37 mins ?$Therapeutic Activity: 23-37 mins          ?          ? ?12/09/2021 ? ?Ginger Carne., PT ?Acute Rehabilitation Services ?[phone]  (pager) ?[phone]  (office) ? ? ?Tessie Fass Pattie Flaharty ?12/09/2021, 11:29 AM ? ?

## 2021-12-09 NOTE — Progress Notes (Addendum)
?  Progress Note ? ? ? ?12/09/2021 ?7:08 AM ?3 Days Post-Op ? ?Subjective:  no complaints; says they changed the dressing this morning.  Foot still feels good.   ? ?Afebrile ?HR 60's-90's  ?99991111 systolic ?A999333 RA ? ?Vitals:  ? 12/08/21 2304 12/09/21 0406  ?BP: (!) 143/65 133/63  ?Pulse: 76 73  ?Resp: 20 17  ?Temp: 98.9 ?F (37.2 ?C) 98.3 ?F (36.8 ?C)  ?SpO2: 98% 98%  ? ? ?Physical Exam: ?Cardiac:  regular ?Lungs:  non labored ?Incisions:  lower leg wrapped (bandage changed this morning and is clean) ?Extremities:  faint left DP doppler signal ? ?Pictures taken on 12/08/2021 ? ? ? ? ? ? ?CBC ?   ?Component Value Date/Time  ? WBC 9.2 12/08/2021 0430  ? RBC 3.62 (L) 12/08/2021 0430  ? HGB 10.0 (L) 12/08/2021 0430  ? HCT 31.6 (L) 12/08/2021 0430  ? PLT 230 12/08/2021 0430  ? MCV 87.3 12/08/2021 0430  ? MCH 27.6 12/08/2021 0430  ? MCHC 31.6 12/08/2021 0430  ? RDW 15.6 (H) 12/08/2021 0430  ? LYMPHSABS 1.4 04/02/2018 1533  ? MONOABS 0.8 04/02/2018 1533  ? EOSABS 0.6 04/02/2018 1533  ? BASOSABS 0.0 04/02/2018 1533  ? ? ?BMET ?   ?Component Value Date/Time  ? NA 138 12/07/2021 0224  ? K 3.6 12/07/2021 0224  ? CL 105 12/07/2021 0224  ? CO2 25 12/07/2021 0224  ? GLUCOSE 117 (H) 12/07/2021 0224  ? BUN 12 12/07/2021 0224  ? CREATININE 0.73 12/07/2021 0224  ? CALCIUM 8.6 (L) 12/07/2021 0224  ? GFRNONAA >60 12/07/2021 0224  ? GFRAA >60 08/10/2019 1536  ? ? ?INR ?   ?Component Value Date/Time  ? INR 1.3 (H) 12/06/2021 0730  ? ? ? ?Intake/Output Summary (Last 24 hours) at 12/09/2021 0708 ?Last data filed at 12/09/2021 0413 ?Gross per 24 hour  ?Intake 720 ml  ?Output 2600 ml  ?Net -1880 ml  ? ? ? ?Assessment/Plan:  81 y.o. male is s/p:  ?1) left greater saphenous vein harvest ?2) creation of left posterior tibial artery and vein arteriovenous fistula ?3) left below knee popliteal artery to left posterior tibial artery vein fistula bypass ?4) left second toe partial amputation  ?3 Days Post-Op ? ? ?-pt dressing changed this morning.   Faint DP doppler signal present and pt continues to be symptomatically better.   ?-pt needs to mobilize out of bed-cannot tolerate darco shoe so I have ordered a flat bottom shoe. ?-hgb stable ?-DVT prophylaxis:  heparin gtt-pt on coumadin at home.  May need to start transitioning from heparin to coumadin-will d/w MD.  ?-PT had recommended CIR, however due to his insurance, most likely will not be approved and will need SNF vs HH at therapy discretion.  Will go ahead and get [facility] consult for discharge planning. ? ? ?Leontine Locket, PA-C ?[facility] ?[phone] ?12/09/2021 ?7:08 AM ? ? ?I have interviewed and examined patient with PA and agree with assessment and plan above.  Right flow in the foot by Doppler and the foot is very warm.  Allow open toe amputation to demarcate. ? ?Cornelis Kluver C. Donzetta Matters, MD ?[facility] ?Office: [phone] ?Pager: [phone] ? ? ?

## 2021-12-09 NOTE — Progress Notes (Addendum)
ANTICOAGULATION CONSULT NOTE - Initial Consult ? ?Pharmacy Consult for heparin > warfarin per [facility] ?Indication: DVT & stroke s/p vascular surgery ? ?Allergies  ?Allergen Reactions  ? Sulfa Antibiotics   ?  Weak and dizziness  ? ? ?Patient Measurements: ?Height: 5\' 8"  (172.7 cm) ?Weight: 90.3 kg (199 lb 1.2 oz) ?IBW/kg (Calculated) : 68.4 ?Heparin Dosing Weight: 87.1 kg ? ?Vital Signs: ?Temp: 98.3 ?F (36.8 ?C) (03/11 0739) ?Temp Source: Oral (03/11 0739) ?BP: 143/68 (03/11 0739) ?Pulse Rate: 79 (03/11 0739) ? ?Labs: ?Recent Labs  ?  12/07/21 ?0224 12/07/21 ?1130 12/08/21 ?0430 12/08/21 ?1434 12/08/21 ?2238 12/09/21 ?0723  ?HGB 10.2*  --  10.0*  --   --  9.6*  ?HCT 32.1*  --  31.6*  --   --  29.3*  ?PLT 240  --  230  --   --  229  ?HEPARINUNFRC <0.10*   < > 0.31 0.19* 0.22* 0.27*  ?CREATININE 0.73  --   --   --   --   --   ? < > = values in this interval not displayed.  ? ? ?Estimated Creatinine Clearance: 79.1 mL/min (by C-G formula based on SCr of 0.73 mg/dL). ? ?Medical History: ?Past Medical History:  ?Diagnosis Date  ? Aortic stenosis   ? Aortic sclerosis without stenosis on echo 8/21  ? Arthritis   ? Atrial fibrillation ([place])   ? BPH (benign prostatic hyperplasia)   ? Essential hypertension   ? GERD (gastroesophageal reflux disease)   ? Heart murmur   ? History of DVT (deep vein thrombosis)   ? Positive anticardiolipin  ? History of hiatal hernia   ? History of stroke 2010  ? Lupus ([place])   ? PAD (peripheral artery disease) ([place])   ? Right below-knee amputation in 2019  ? Pulmonary embolism ([place])   ? hx of 10 years ago  ? Shingles   ? Stroke [facility])   ? Tinnitus   ? Wears dentures   ? Wears glasses   ? ? ?Medications:  ?Infusions:  ? sodium chloride    ? heparin 1,650 Units/hr (12/09/21 0009)  ? magnesium sulfate bolus IVPB    ? ? ?Assessment: ?82 YOM on warfarin PTA for h/o PAD/CVA/DVT now s/p tibial artery vein fistula with L toe amputation 3/8. Pharmacy consulted for heparin dosing - now transitioning to  warfarin. ? ?Heparin level 0.27 remains subtherapeutic after two rate increases, most recently to 1650 units/hr. Level drawn appropriately - infusion running through L forearm, drawn from R arm. Hgb slightly down 9.6, platelets stable. No infusion problems or bleeding. ? ?3/11 AM Update: transitioning to PTA warfarin ?PTA regimen = 4 mg daily ? ?Goal of Therapy:  ?INR 2-3 ?Monitor platelets by anticoagulation protocol: Yes ?  ?Plan:  ?Stop heparin infusion ?Start PTA warfarin regimen 4 mg daily ?Lovenox bridge 90 mg SQ BID ?Baseline INR, then daily ?Weekly CBC (at minimum) ? ?Laurey Arrow, PharmD ?PGY1 Pharmacy Resident ?12/09/2021  8:23 AM ? ?Please check [email or web address] for unit-specific pharmacy phone numbers. ? ?

## 2021-12-09 NOTE — Progress Notes (Signed)
ANTICOAGULATION CONSULT NOTE  ?Pharmacy Consult for heparin ?Indication: DVT and stroke, s/p vascular surgery  ?Brief A/P: Heparin level subtherapeutic Increase Heparin rate ? ?Allergies  ?Allergen Reactions  ? Sulfa Antibiotics   ?  Weak and dizziness  ? ? ?Patient Measurements: ?Height: 5\' 8"  (172.7 cm) ?Weight: 90.3 kg (199 lb 1.2 oz) ?IBW/kg (Calculated) : 68.4 ?Heparin Dosing Weight: 87kg ? ?Vital Signs: ?Temp: 98.9 ?F (37.2 ?C) (03/10 2304) ?Temp Source: Oral (03/10 2304) ?BP: 143/65 (03/10 2304) ?Pulse Rate: 76 (03/10 2304) ? ?Labs: ?Recent Labs  ?  12/06/21 ?0730 12/06/21 ?1155 12/07/21 ?0224 12/07/21 ?1130 12/08/21 ?0430 12/08/21 ?1434 12/08/21 ?2238  ?HGB 10.6* 10.5* 10.2*  --  10.0*  --   --   ?HCT 33.6* 31.0* 32.1*  --  31.6*  --   --   ?PLT 218  --  240  --  230  --   --   ?APTT 29  --   --   --   --   --   --   ?LABPROT 15.8*  --   --   --   --   --   --   ?INR 1.3*  --   --   --   --   --   --   ?HEPARINUNFRC  --   --  <0.10*   < > 0.31 0.19* 0.22*  ?CREATININE 0.62  --  0.73  --   --   --   --   ? < > = values in this interval not displayed.  ? ? ?Estimated Creatinine Clearance: 79.1 mL/min (by C-G formula based on SCr of 0.73 mg/dL). ? ?Assessment: ?81 y.o. male with h/o VTE for heparin  ? ?Goal of Therapy:  ?Heparin level 0.3-0.5 units/ml ?Monitor platelets by anticoagulation protocol: Yes ?  ?Plan:  ?Increase Heparin 1650 units/hr ?Check heparin level in 8 hours. ? ?Phillis Knack, PharmD, BCPS  ? ?

## 2021-12-10 ENCOUNTER — Inpatient Hospital Stay (HOSPITAL_COMMUNITY): Payer: Medicare HMO | Admitting: Certified Registered"

## 2021-12-10 ENCOUNTER — Other Ambulatory Visit: Payer: Self-pay

## 2021-12-10 ENCOUNTER — Encounter (HOSPITAL_COMMUNITY)
Admission: RE | Disposition: A | Discharge status: Hospice | Payer: Self-pay | Source: Home / Self Care | Attending: Vascular Surgery

## 2021-12-10 DIAGNOSIS — I70245 Atherosclerosis of native arteries of left leg with ulceration of other part of foot: Secondary | ICD-10-CM

## 2021-12-10 DIAGNOSIS — Z8673 Personal history of transient ischemic attack (TIA), and cerebral infarction without residual deficits: Secondary | ICD-10-CM

## 2021-12-10 DIAGNOSIS — T82838A Hemorrhage of vascular prosthetic devices, implants and grafts, initial encounter: Secondary | ICD-10-CM | POA: Diagnosis not present

## 2021-12-10 DIAGNOSIS — I739 Peripheral vascular disease, unspecified: Secondary | ICD-10-CM

## 2021-12-10 DIAGNOSIS — I10 Essential (primary) hypertension: Secondary | ICD-10-CM

## 2021-12-10 HISTORY — PX: FEMORAL-POPLITEAL BYPASS GRAFT: SHX937

## 2021-12-10 LAB — POCT I-STAT 7, (LYTES, BLD GAS, ICA,H+H)
Acid-base deficit: 1 mmol/L (ref 0.0–2.0)
Bicarbonate: 25.3 mmol/L (ref 20.0–28.0)
Calcium, Ion: 1.21 mmol/L (ref 1.15–1.40)
HCT: 25 % — ABNORMAL LOW (ref 39.0–52.0)
Hemoglobin: 8.5 g/dL — ABNORMAL LOW (ref 13.0–17.0)
O2 Saturation: 100 %
Potassium: 4.6 mmol/L (ref 3.5–5.1)
Sodium: 134 mmol/L — ABNORMAL LOW (ref 135–145)
TCO2: 27 mmol/L (ref 22–32)
pCO2 arterial: 47.5 mmHg (ref 32–48)
pH, Arterial: 7.335 — ABNORMAL LOW (ref 7.35–7.45)
pO2, Arterial: 237 mmHg — ABNORMAL HIGH (ref 83–108)

## 2021-12-10 LAB — CBC
HCT: 27.9 % — ABNORMAL LOW (ref 39.0–52.0)
Hemoglobin: 8.8 g/dL — ABNORMAL LOW (ref 13.0–17.0)
MCH: 27.5 pg (ref 26.0–34.0)
MCHC: 31.5 g/dL (ref 30.0–36.0)
MCV: 87.2 fL (ref 80.0–100.0)
Platelets: 255 10*3/uL (ref 150–400)
RBC: 3.2 MIL/uL — ABNORMAL LOW (ref 4.22–5.81)
RDW: 15.6 % — ABNORMAL HIGH (ref 11.5–15.5)
WBC: 7.2 10*3/uL (ref 4.0–10.5)
nRBC: 0 % (ref 0.0–0.2)

## 2021-12-10 LAB — PROTIME-INR
INR: 1.2 (ref 0.8–1.2)
Prothrombin Time: 14.7 seconds (ref 11.4–15.2)

## 2021-12-10 LAB — PREPARE RBC (CROSSMATCH)

## 2021-12-10 LAB — HEMOGLOBIN AND HEMATOCRIT, BLOOD
HCT: 29.7 % — ABNORMAL LOW (ref 39.0–52.0)
HCT: 28.4 % — ABNORMAL LOW (ref 39.0–52.0)
Hemoglobin: 9.2 g/dL — ABNORMAL LOW (ref 13.0–17.0)
Hemoglobin: 8.8 g/dL — ABNORMAL LOW (ref 13.0–17.0)

## 2021-12-10 SURGERY — BYPASS GRAFT FEMORAL-POPLITEAL ARTERY
Anesthesia: General | Site: Leg Lower | Laterality: Left

## 2021-12-10 MED ORDER — LACTATED RINGERS IV SOLN: INTRAVENOUS | Status: DC | PRN

## 2021-12-10 MED ORDER — ONDANSETRON HCL 4 MG/2ML IJ SOLN
INTRAMUSCULAR | Status: AC
  Filled 2021-12-10: qty 2

## 2021-12-10 MED ORDER — LIDOCAINE 2% (20 MG/ML) 5 ML SYRINGE
INTRAMUSCULAR | Status: AC
  Filled 2021-12-10: qty 5

## 2021-12-10 MED ORDER — PROPOFOL 10 MG/ML IV BOLUS
INTRAVENOUS | Status: AC
  Filled 2021-12-10: qty 20

## 2021-12-10 MED ORDER — SUCCINYLCHOLINE CHLORIDE 200 MG/10ML IV SOSY
PREFILLED_SYRINGE | INTRAVENOUS | Status: DC | PRN
  Administered 2021-12-10: 120 mg via INTRAVENOUS

## 2021-12-10 MED ORDER — FENTANYL CITRATE (PF) 100 MCG/2ML IJ SOLN: 25.0000 ug | INTRAMUSCULAR | Status: DC | PRN

## 2021-12-10 MED ORDER — FENTANYL CITRATE (PF) 250 MCG/5ML IJ SOLN
INTRAMUSCULAR | Status: AC
  Filled 2021-12-10: qty 5

## 2021-12-10 MED ORDER — ONDANSETRON HCL 4 MG/2ML IJ SOLN
INTRAMUSCULAR | Status: DC | PRN
Start: 2021-12-10 — End: 2021-12-10
  Administered 2021-12-10: 4 mg via INTRAVENOUS

## 2021-12-10 MED ORDER — HEPARIN SODIUM (PORCINE) 1000 UNIT/ML IJ SOLN
INTRAMUSCULAR | Status: AC
  Filled 2021-12-10: qty 10

## 2021-12-10 MED ORDER — HEPARIN 6000 UNIT IRRIGATION SOLUTION
Status: AC
  Filled 2021-12-10: qty 500

## 2021-12-10 MED ORDER — FENTANYL CITRATE (PF) 250 MCG/5ML IJ SOLN
INTRAMUSCULAR | Status: DC | PRN
  Administered 2021-12-10 (×3): 50 ug via INTRAVENOUS

## 2021-12-10 MED ORDER — DEXAMETHASONE SODIUM PHOSPHATE 10 MG/ML IJ SOLN
INTRAMUSCULAR | Status: DC | PRN
  Administered 2021-12-10: 10 mg via INTRAVENOUS

## 2021-12-10 MED ORDER — LIDOCAINE 2% (20 MG/ML) 5 ML SYRINGE
INTRAMUSCULAR | Status: DC | PRN
  Administered 2021-12-10: 100 mg via INTRAVENOUS

## 2021-12-10 MED ORDER — HEPARIN 6000 UNIT IRRIGATION SOLUTION
Status: DC | PRN
  Administered 2021-12-10: 1

## 2021-12-10 MED ORDER — ROCURONIUM BROMIDE 10 MG/ML (PF) SYRINGE
PREFILLED_SYRINGE | INTRAVENOUS | Status: AC
  Filled 2021-12-10: qty 10

## 2021-12-10 MED ORDER — PROPOFOL 10 MG/ML IV BOLUS
INTRAVENOUS | Status: DC | PRN
  Administered 2021-12-10: 100 mg via INTRAVENOUS
  Administered 2021-12-10: 80 mg via INTRAVENOUS

## 2021-12-10 MED ORDER — HYDROMORPHONE HCL 1 MG/ML IJ SOLN
1.0000 mg | Freq: Once | INTRAMUSCULAR | Status: AC
  Administered 2021-12-10: 1 mg via INTRAVENOUS
  Filled 2021-12-10: qty 1

## 2021-12-10 MED ORDER — SUCCINYLCHOLINE CHLORIDE 200 MG/10ML IV SOSY
PREFILLED_SYRINGE | INTRAVENOUS | Status: AC
  Filled 2021-12-10: qty 10

## 2021-12-10 MED ORDER — ROCURONIUM BROMIDE 10 MG/ML (PF) SYRINGE
PREFILLED_SYRINGE | INTRAVENOUS | Status: DC | PRN
  Administered 2021-12-10: 50 mg via INTRAVENOUS

## 2021-12-10 MED ORDER — 0.9 % SODIUM CHLORIDE (POUR BTL) OPTIME
TOPICAL | Status: DC | PRN
  Administered 2021-12-10: 2000 mL

## 2021-12-10 MED ORDER — CEFAZOLIN SODIUM-DEXTROSE 2-3 GM-%(50ML) IV SOLR
INTRAVENOUS | Status: DC | PRN
  Administered 2021-12-10: 2 g via INTRAVENOUS

## 2021-12-10 MED ORDER — SUGAMMADEX SODIUM 200 MG/2ML IV SOLN
INTRAVENOUS | Status: DC | PRN
  Administered 2021-12-10: 300 mg via INTRAVENOUS

## 2021-12-10 MED ORDER — SODIUM CHLORIDE 0.9% IV SOLUTION: Freq: Once | INTRAVENOUS | Status: DC

## 2021-12-10 MED ORDER — SODIUM CHLORIDE 0.9 % IV SOLN
10.0000 mL/h | Freq: Once | INTRAVENOUS | Status: DC
Start: 2021-12-10 — End: 2021-12-20

## 2021-12-10 MED ORDER — DEXAMETHASONE SODIUM PHOSPHATE 10 MG/ML IJ SOLN
INTRAMUSCULAR | Status: AC
  Filled 2021-12-10: qty 1

## 2021-12-10 MED ORDER — CEFAZOLIN SODIUM 1 G IJ SOLR
INTRAMUSCULAR | Status: AC
  Filled 2021-12-10: qty 20

## 2021-12-10 MED ORDER — WARFARIN SODIUM 5 MG PO TABS: 5.0000 mg | ORAL_TABLET | Freq: Once | ORAL | Status: DC

## 2021-12-10 SURGICAL SUPPLY — 50 items
ADH SKN CLS APL DERMABOND .7 (GAUZE/BANDAGES/DRESSINGS) ×1
BANDAGE ESMARK 6X9 LF (GAUZE/BANDAGES/DRESSINGS) IMPLANT
BNDG CMPR 9X6 STRL LF SNTH (GAUZE/BANDAGES/DRESSINGS)
BNDG ELASTIC 4X5.8 VLCR STR LF (GAUZE/BANDAGES/DRESSINGS) ×2 IMPLANT
BNDG ELASTIC 6X5.8 VLCR STR LF (GAUZE/BANDAGES/DRESSINGS) ×2 IMPLANT
BNDG ESMARK 6X9 LF (GAUZE/BANDAGES/DRESSINGS)
CANISTER SUCT 3000ML PPV (MISCELLANEOUS) ×3 IMPLANT
CANNULA VESSEL 3MM 2 BLNT TIP (CANNULA) IMPLANT
CLIP LIGATING EXTRA MED SLVR (CLIP) ×3 IMPLANT
CLIP LIGATING EXTRA SM BLUE (MISCELLANEOUS) ×3 IMPLANT
CUFF TOURN SGL QUICK 34 (TOURNIQUET CUFF) ×3
CUFF TRNQT CYL 34X4.125X (TOURNIQUET CUFF) IMPLANT
DERMABOND ADVANCED (GAUZE/BANDAGES/DRESSINGS) ×2
DERMABOND ADVANCED .7 DNX12 (GAUZE/BANDAGES/DRESSINGS) ×1 IMPLANT
DRAPE C-ARM 42X72 X-RAY (DRAPES) IMPLANT
DRAPE HALF SHEET 40X57 (DRAPES) IMPLANT
DRAPE X-RAY CASS 24X20 (DRAPES) IMPLANT
DRSG PAD ABDOMINAL 8X10 ST (GAUZE/BANDAGES/DRESSINGS) ×4 IMPLANT
ELECT REM PT RETURN 9FT ADLT (ELECTROSURGICAL) ×3
ELECTRODE REM PT RTRN 9FT ADLT (ELECTROSURGICAL) ×1 IMPLANT
GAUZE SPONGE 4X4 12PLY STRL (GAUZE/BANDAGES/DRESSINGS) ×2 IMPLANT
GLOVE SURG ENC MOIS LTX SZ7.5 (GLOVE) ×3 IMPLANT
GOWN STRL REUS W/ TWL LRG LVL3 (GOWN DISPOSABLE) ×2 IMPLANT
GOWN STRL REUS W/ TWL XL LVL3 (GOWN DISPOSABLE) ×1 IMPLANT
GOWN STRL REUS W/TWL LRG LVL3 (GOWN DISPOSABLE) ×6
GOWN STRL REUS W/TWL XL LVL3 (GOWN DISPOSABLE) ×3
HEMOSTAT SNOW SURGICEL 2X4 (HEMOSTASIS) IMPLANT
INSERT FOGARTY SM (MISCELLANEOUS) IMPLANT
KIT BASIN OR (CUSTOM PROCEDURE TRAY) ×3 IMPLANT
KIT TURNOVER KIT B (KITS) ×3 IMPLANT
NS IRRIG 1000ML POUR BTL (IV SOLUTION) ×6 IMPLANT
PACK PERIPHERAL VASCULAR (CUSTOM PROCEDURE TRAY) ×3 IMPLANT
PAD ARMBOARD 7.5X6 YLW CONV (MISCELLANEOUS) ×6 IMPLANT
STOPCOCK 4 WAY LG BORE MALE ST (IV SETS) IMPLANT
SUT ETHILON 3 0 PS 1 (SUTURE) IMPLANT
SUT MNCRL AB 4-0 PS2 18 (SUTURE) ×6 IMPLANT
SUT PROLENE 5 0 C 1 24 (SUTURE) ×11 IMPLANT
SUT PROLENE 6 0 BV (SUTURE) ×3 IMPLANT
SUT SILK 2 0 SH (SUTURE) ×3 IMPLANT
SUT SILK 3 0 (SUTURE)
SUT SILK 3-0 18XBRD TIE 12 (SUTURE) IMPLANT
SUT VIC AB 2-0 CT1 27 (SUTURE) ×6
SUT VIC AB 2-0 CT1 TAPERPNT 27 (SUTURE) ×2 IMPLANT
SUT VIC AB 2-0 CTB1 (SUTURE) ×2 IMPLANT
SUT VIC AB 3-0 SH 27 (SUTURE) ×9
SUT VIC AB 3-0 SH 27X BRD (SUTURE) ×2 IMPLANT
TOWEL GREEN STERILE (TOWEL DISPOSABLE) ×3 IMPLANT
TRAY FOLEY MTR SLVR 16FR STAT (SET/KITS/TRAYS/PACK) ×3 IMPLANT
UNDERPAD 30X36 HEAVY ABSORB (UNDERPADS AND DIAPERS) ×3 IMPLANT
WATER STERILE IRR 1000ML POUR (IV SOLUTION) ×3 IMPLANT

## 2021-12-10 NOTE — Anesthesia Postprocedure Evaluation (Signed)
Anesthesia Post Note ? ?Patient: Jose Bush ? ?Procedure(s) Performed: LEFT LOWER EXTREMITY WOUND EXPLORATION, REPAIR OF PROXIMAL ANASTIMOSIS OF FEMORAL/TIBIAL BYPASS. (Left: Leg Lower) ? ?  ? ?Patient location during evaluation: PACU ?Anesthesia Type: General ?Level of consciousness: awake ?Pain management: pain level controlled ?Vital Signs Assessment: post-procedure vital signs reviewed and stable ?Cardiovascular status: stable ?Postop Assessment: no apparent nausea or vomiting ?Anesthetic complications: no ? ? ?No notable events documented. ? ?Last Vitals:  ?Vitals:  ? 12/10/21 1600 12/10/21 1634  ?BP: (!) 116/54 (!) 124/52  ?Pulse: 96 99  ?Resp: 13 19  ?Temp: 36.5 ?C 37 ?C  ?SpO2: 98% 100%  ?  ?Last Pain:  ?Vitals:  ? 12/10/21 1634  ?TempSrc: Oral  ?PainSc: 2   ? ? ?  ?  ?  ?  ?  ?  ? ?[place] ? ? ? ? ?

## 2021-12-10 NOTE — Op Note (Signed)
? ? ?  Patient name: Jose Bush MRN: [id number] DOB: [birth year] Sex: male ? ?12/10/2021 ?Pre-operative Diagnosis: Bleeding left lower extremity bypass graft ?Post-operative diagnosis:  Same ?Surgeon:  Eda Paschal. Donzetta Matters, MD ?Assistant: Leontine Locket, PA ?Procedure Performed: Repair of left popliteal to posterior tibial artery bypass graft proximal anastomosis ? ?Indications: 81 year old male has recently undergone left popliteal to posterior tibial artery bypass with concomitant AV fistula for desert foot.  I was called emergently to the bedside today for bleeding from the below-knee incision and tourniquet was placed.  We then proceeded emergently to the operating room for exploration. ? ?An assistant was necessary to expedite this emergent case specifically to provide suction and retraction and to aid in primary repair of the bypass graft. ? ?Findings: A tourniquet was in place.  There was bleeding from a pinpoint hole in the vein bypass just off of the anastomosis.  The anastomosis itself appeared to be intact.  This was primarily paired with 5-0 Prolene suture in the tourniquet was allowed down.  After this there was good flow in the bypass. ? ?In the recovery area I did identify a signal in the foot consistent with his signals previously identified at bedside.  I discussed with the family that any further emergent bleeding would likely be best treated with primary amputation. ?  ?Procedure:  The patient was identified in the holding area and taken to the operating room where is placed supine on upper table general anesthesia was induced.  He was sterilely prepped and draped in the left lower extremity and a blood pressure cuff was exchanged for a formal tourniquet.  Antibiotics were minister timeout was called.  We began by opening his incision below the knee.  There was backbleeding from the vein.  We were able to expose this and good suction and I primarily repaired this with 2 interrupted 5-0 Prolene  sutures.  I then inspected the anastomosis appeared to be intact.  We irrigated the wound.  We allowed the tourniquet down and there was good flow through the bypass and this was confirmed with Doppler.  We then again irrigated the wound there was no further bleeding.  I placed an interrupted 2-0 Vicryl sutures in the deep tissue and then closed the skin with staples.  Sterile dressing was applied and wrapped with Ace wrap.  He was awakened from anesthesia having tolerated procedure without any complication.  All counts were correct at completion. ? ? ?EBL: 50cc ? ?Milady Fleener C. Donzetta Matters, MD ?[facility] ?Office: [phone] ?Pager: [phone] ? ? ?

## 2021-12-10 NOTE — Progress Notes (Signed)
?  ?  I was called to patient's bedside for left lower extremity bleeding.  Upon arrival the patient has a blood pressure cuff inflated to 140 mmHg above his knee.  The leg below the knee is mottled there is evidence of oozing from his medial incision but reportedly was pulsatile bleeding previously.  I discussed with the patient the need to ligate his bypass and he would likely require above-knee amputation and I have offered above-knee amputation today in the OR.  This time patient agrees to ligation of bypass and discussion of amputation at a later time.  I have notified the OR about emergent procedure. ? ?Brookie Wayment C. Donzetta Matters, MD ?[facility] ?Office: [phone] ?Pager: [phone] ? ?

## 2021-12-10 NOTE — Anesthesia Preprocedure Evaluation (Addendum)
Anesthesia Evaluation  ?Patient identified by MRN, date of birth, ID band ?Patient awake ? ? ? ?Reviewed: ?Allergy & Precautions, NPO status , Patient's Chart, lab work & pertinent test resultsPreop documentation limited or incomplete due to emergent nature of procedure. ? ?Airway ?Mallampati: II ? ?TM Distance: >3 FB ? ? ? ? Dental ?  ?Pulmonary ?former smoker,  ?  ?breath sounds clear to auscultation ? ? ? ? ? ? Cardiovascular ?hypertension, + Peripheral Vascular Disease  ? ?Rhythm:Regular Rate:Normal ? ? ?  ?Neuro/Psych ? Headaches, CVA   ? GI/Hepatic ?hiatal hernia, GERD  ,  ?Endo/Other  ? ? Renal/GU ?  ? ?  ?Musculoskeletal ? ?(+) Arthritis ,  ? Abdominal ?  ?Peds ? Hematology ?  ?Anesthesia Other Findings ? ? Reproductive/Obstetrics ? ?  ? ? ? ? ? ? ? ? ? ? ? ? ? ?  ?  ? ? ? ? ? ? ? ?Anesthesia Physical ?Anesthesia Plan ? ?ASA: 3 ? ?Anesthesia Plan: General  ? ?Post-op Pain Management:   ? ?Induction: Intravenous ? ?PONV Risk Score and Plan: Treatment may vary due to age or medical condition ? ?Airway Management Planned: Oral ETT ? ?Additional Equipment:  ? ?Intra-op Plan:  ? ?Post-operative Plan: Possible Post-op intubation/ventilation ? ?Informed Consent: I have reviewed the patients History and Physical, chart, labs and discussed the procedure including the risks, benefits and alternatives for the proposed anesthesia with the patient or authorized representative who has indicated his/her understanding and acceptance.  ? ? ? ?Dental advisory given ? ?Plan Discussed with: Anesthesiologist and CRNA ? ?Anesthesia Plan Comments:   ? ? ? ? ? ?Anesthesia Quick Evaluation ? ?

## 2021-12-10 NOTE — Anesthesia Procedure Notes (Signed)
Procedure Name: Intubation ?Date/Time: 12/10/2021 2:17 PM ?Performed by: Imagene Riches, CRNA ?Pre-anesthesia Checklist: Patient identified, Emergency Drugs available, Suction available and Patient being monitored ?Patient Re-evaluated:Patient Re-evaluated prior to induction ?Oxygen Delivery Method: Circle System Utilized ?Preoxygenation: Pre-oxygenation with 100% oxygen ?Induction Type: IV induction, Rapid sequence and Cricoid Pressure applied ?Laryngoscope Size: Sabra Heck and 2 ?Grade View: Grade I ?Tube type: Oral ?Tube size: 7.5 mm ?Number of attempts: 1 ?Airway Equipment and Method: Stylet and Oral airway ?Placement Confirmation: ETT inserted through vocal cords under direct vision, positive ETCO2 and breath sounds checked- equal and bilateral ?Secured at: 22 cm ?Tube secured with: Tape ?Dental Injury: Teeth and Oropharynx as per pre-operative assessment  ? ? ? ? ?

## 2021-12-10 NOTE — Anesthesia Procedure Notes (Signed)
Arterial Line Insertion ?Start/End3/08/2022 2:20 PM, 12/10/2021 2:25 PM ?Performed by: Elliot Dally, CRNA, CRNA ? Patient location: OR. ?Preanesthetic checklist: patient identified, IV checked, site marked, risks and benefits discussed, surgical consent, monitors and equipment checked, pre-op evaluation, timeout performed and anesthesia consent ?Left, radial was placed ?Catheter size: 20 G ?Hand hygiene performed  and maximum sterile barriers used  ? ?Attempts: 1 ?Procedure performed without using ultrasound guided technique. ?Following insertion, dressing applied and Biopatch. ?Post procedure assessment: normal ? ?Patient tolerated the procedure well with no immediate complications. ? ? ?

## 2021-12-10 NOTE — Transfer of Care (Signed)
Immediate Anesthesia Transfer of Care Note ? ?Patient: Jose Bush ? ?Procedure(s) Performed: LEFT LOWER EXTREMITY WOUND EXPLORATION, REPAIR OF PROXIMAL ANASTIMOSIS OF FEMORAL/TIBIAL BYPASS. (Left: Leg Lower) ? ?Patient Location: PACU ? ?Anesthesia Type:General ? ?Level of Consciousness: awake, alert  and oriented ? ?Airway & Oxygen Therapy: Patient Spontanous Breathing and Patient connected to nasal cannula oxygen ? ?Post-op Assessment: Report given to RN and Post -op Vital signs reviewed and stable ? ?Post vital signs: Reviewed and stable ? ?Last Vitals:  ?Vitals Value Taken Time  ?BP 123/63 12/10/21 1531  ?Temp 36.2 ?C 12/10/21 1530  ?Pulse 98 12/10/21 1533  ?Resp 18 12/10/21 1533  ?SpO2 100 % 12/10/21 1533  ?Vitals shown include unvalidated device data. ? ?Last Pain:  ?Vitals:  ? 12/10/21 1140  ?TempSrc: Oral  ?PainSc:   ?   ? ?Patients Stated Pain Goal: 0 (12/06/21 0725) ? ?Complications: No notable events documented. ?

## 2021-12-10 NOTE — Progress Notes (Signed)
Upon entering room patient sitting in chair, Patient mid part of leg bleeding with a large amount of blood in floor. And leg continuing to bleed.  Nursing staff at time holding pressure to leg. While attempting to obtain vital signs Patient then became unresponsive and had an incontinent episode.Patient with pulse . patient placed in bed. And vital signs obtained bp 130/64 heart rate 96. He then became alert and patient is oriented.Leontine Locket PAC made aware and rapid response RN at bedside.  ?1330 DR. Donzetta Matters updated.  ?Vital signs cycling see flow sheet. Bedside RN updated and in room ? Sherena Machorro, Bettina Gavia ?RN  ?

## 2021-12-10 NOTE — Progress Notes (Addendum)
?  Progress Note ? ? ? ?12/10/2021 ?7:24 AM ?4 Days Post-Op ? ?Subjective:  no complaints.  Dressing changed this morning ? ?Afebrile ?HR 70's-90's  ?[id number] systolic ?0000000 RA ? ?Vitals:  ? 12/09/21 2340 12/10/21 0404  ?BP: (!) 97/51 115/64  ?Pulse: 72 71  ?Resp: 18 16  ?Temp: 98.1 ?F (36.7 ?C) 97.9 ?F (36.6 ?C)  ?SpO2: 96% 96%  ? ? ?Physical Exam: ?General:  no distress ?Lungs:  non labored ?Incisions:  thigh incisions look fine; lower extremity incisions covered as dressing just changed this am ?Extremities:  left foot is warm  ? ? ?CBC ?   ?Component Value Date/Time  ? WBC 7.2 12/10/2021 0317  ? RBC 3.20 (L) 12/10/2021 0317  ? HGB 8.8 (L) 12/10/2021 0317  ? HCT 27.9 (L) 12/10/2021 0317  ? PLT 255 12/10/2021 0317  ? MCV 87.2 12/10/2021 0317  ? MCH 27.5 12/10/2021 0317  ? MCHC 31.5 12/10/2021 0317  ? RDW 15.6 (H) 12/10/2021 0317  ? LYMPHSABS 1.4 04/02/2018 1533  ? MONOABS 0.8 04/02/2018 1533  ? EOSABS 0.6 04/02/2018 1533  ? BASOSABS 0.0 04/02/2018 1533  ? ? ?BMET ?   ?Component Value Date/Time  ? NA 138 12/07/2021 0224  ? K 3.6 12/07/2021 0224  ? CL 105 12/07/2021 0224  ? CO2 25 12/07/2021 0224  ? GLUCOSE 117 (H) 12/07/2021 0224  ? BUN 12 12/07/2021 0224  ? CREATININE 0.73 12/07/2021 0224  ? CALCIUM 8.6 (L) 12/07/2021 0224  ? GFRNONAA >60 12/07/2021 0224  ? GFRAA >60 08/10/2019 1536  ? ? ?INR ?   ?Component Value Date/Time  ? INR 1.2 12/09/2021 1001  ? ? ? ?Intake/Output Summary (Last 24 hours) at 12/10/2021 0724 ?Last data filed at 12/10/2021 0405 ?Gross per 24 hour  ?Intake 0 ml  ?Output 1600 ml  ?Net -1600 ml  ? ? ? ?Assessment/Plan:  81 y.o. male is s/p:  ?1) left greater saphenous vein harvest ?2) creation of left posterior tibial artery and vein arteriovenous fistula ?3) left below knee popliteal artery to left posterior tibial artery vein fistula bypass ?4) left second toe partial amputation   ?4 Days Post-Op ? ? ?-dressing not removed from LLE as it was just changed an hour ago.  Will check tomorrow.  Left  foot is warm. ?-DVT prophylaxis:  heparin to coumadin bridge-heparin gtt discontinued and on Lovenox to coumadin bridge currently.  INR for tday is pending.  ?-mobilize out of bed ? ? ?Leontine Locket, PA-C ?[facility] ?[phone] ?12/10/2021 ?7:24 AM ? ? ?I have interviewed and examined patient with PA and agree with assessment and plan above.  ? ?Summit Borchardt C. Donzetta Matters, MD ?[facility] ?Office: [phone] ?Pager: [phone] ? ? ?

## 2021-12-10 NOTE — Progress Notes (Signed)
ANTICOAGULATION CONSULT NOTE - Initial Consult ? ?Pharmacy Consult for heparin > warfarin per [facility] ?Indication: DVT & stroke s/p vascular surgery ? ?Allergies  ?Allergen Reactions  ? Sulfa Antibiotics   ?  Weak and dizziness  ? ? ?Patient Measurements: ?Height: 5\' 8"  (172.7 cm) ?Weight: 90.3 kg (199 lb 1.2 oz) ?IBW/kg (Calculated) : 68.4 ?Heparin Dosing Weight: 87.1 kg ? ?Vital Signs: ?Temp: 98.3 ?F (36.8 ?C) (03/12 0800) ?Temp Source: Oral (03/12 0800) ?BP: 144/70 (03/12 0800) ?Pulse Rate: 80 (03/12 0800) ? ?Labs: ?Recent Labs  ?  12/08/21 ?0430 12/08/21 ?1434 12/08/21 ?2238 12/09/21 ?0723 12/09/21 ?1001 12/10/21 ?[id number]  ?HGB 10.0*  --   --  9.6*  --  8.8*  ?HCT 31.6*  --   --  29.3*  --  27.9*  ?PLT 230  --   --  229  --  255  ?LABPROT  --   --   --   --  15.1  --   ?INR  --   --   --   --  1.2  --   ?HEPARINUNFRC 0.31 0.19* 0.22* 0.27*  --   --   ? ? ? ?Estimated Creatinine Clearance: 79.1 mL/min (by C-G formula based on SCr of 0.73 mg/dL). ? ?Medical History: ?Past Medical History:  ?Diagnosis Date  ? Aortic stenosis   ? Aortic sclerosis without stenosis on echo 8/21  ? Arthritis   ? Atrial fibrillation ([place])   ? BPH (benign prostatic hyperplasia)   ? Essential hypertension   ? GERD (gastroesophageal reflux disease)   ? Heart murmur   ? History of DVT (deep vein thrombosis)   ? Positive anticardiolipin  ? History of hiatal hernia   ? History of stroke 2010  ? Lupus ([place])   ? PAD (peripheral artery disease) ([place])   ? Right below-knee amputation in 2019  ? Pulmonary embolism ([place])   ? hx of 10 years ago  ? Shingles   ? Stroke [facility])   ? Tinnitus   ? Wears dentures   ? Wears glasses   ? ? ?Medications:  ?Infusions:  ? sodium chloride    ? magnesium sulfate bolus IVPB    ? ? ?Assessment: ?34 YOM on warfarin PTA for h/o PAD/CVA/DVT now s/p tibial artery vein fistula with L toe amputation 3/8. Pharmacy consulted for heparin dosing - now transitioning to warfarin 3/11. ? ?PTA warfarin regimen = 4 mg daily ? ?INR 1.2  subtherapeutic. Hgb trending down, now 8.8; platelets stable. ? ?Goal of Therapy:  ?INR 2-3 ?Monitor platelets by anticoagulation protocol: Yes ?  ?Plan:  ?Warfarin 5 mg x1 - higher than PTA regimen to get INR therapeutic  ?Lovenox bridge 90 mg SQ BID ?INR daily ? ?Laurey Arrow, PharmD ?PGY1 Pharmacy Resident ?12/10/2021  8:22 AM ? ?Please check [email or web address] for unit-specific pharmacy phone numbers. ? ?

## 2021-12-10 NOTE — Progress Notes (Signed)
Pt returned from OR.  Vitals taken and all within normal range. Left lower extremity warm with dopplerable  DP pulse.  Pt is A&O X4 and neuro intact.  Pt is currently comfortable and not in pain.    ? 12/10/21 1634  ?Vitals  ?Temp 98.6 ?F (37 ?C)  ?Temp Source Oral  ?BP (!) 124/52  ?MAP (mmHg) 74  ?BP Location Right Arm  ?BP Method Automatic  ?Patient Position (if appropriate) Lying  ?Pulse Rate 99  ?Pulse Rate Source Monitor  ?ECG Heart Rate 100  ?Resp 19  ?Level of Consciousness  ?Level of Consciousness Alert  ?Oxygen Therapy  ?SpO2 100 %  ?O2 Device Room Air  ?Pain Assessment  ?Pain Scale 0-10  ?Pain Score 2  ?Patients Stated Pain Goal 0  ?Pain Type Surgical pain  ?Pain Location Foot  ?Pain Orientation Left  ?Pain Descriptors / Indicators Aching  ?Pain Frequency Intermittent  ?Pain Onset On-going  ?Pain Intervention(s) Medication (See eMAR)  ?Multiple Pain Sites No  ?POSS Scale ([place]ero Opioid Sedation Scale)  ?POSS *See Group Information* 1-Acceptable,Awake and alert  ?PCA/Epidural/Spinal Assessment  ?Respiratory Pattern Regular;Unlabored  ?Glasgow Coma Scale  ?Eye Opening 4  ?Best Verbal Response (NON-intubated) 5  ?Best Motor Response 6  ?Glasgow Coma Scale Score 15  ?MEWS Score  ?MEWS Temp 0  ?MEWS Systolic 0  ?MEWS Pulse 0  ?MEWS RR 0  ?MEWS LOC 0  ?MEWS Score 0  ?MEWS Score Color Green  ? ? ?

## 2021-12-11 ENCOUNTER — Encounter (HOSPITAL_COMMUNITY): Payer: Self-pay | Admitting: Vascular Surgery

## 2021-12-11 LAB — CBC
HCT: 25.2 % — ABNORMAL LOW (ref 39.0–52.0)
Hemoglobin: 7.9 g/dL — ABNORMAL LOW (ref 13.0–17.0)
MCH: 27.8 pg (ref 26.0–34.0)
MCHC: 31.3 g/dL (ref 30.0–36.0)
MCV: 88.7 fL (ref 80.0–100.0)
Platelets: 282 10*3/uL (ref 150–400)
RBC: 2.84 MIL/uL — ABNORMAL LOW (ref 4.22–5.81)
RDW: 15.6 % — ABNORMAL HIGH (ref 11.5–15.5)
WBC: 9 10*3/uL (ref 4.0–10.5)
nRBC: 0 % (ref 0.0–0.2)

## 2021-12-11 LAB — PROTIME-INR
INR: 1.1 (ref 0.8–1.2)
Prothrombin Time: 13.9 seconds (ref 11.4–15.2)

## 2021-12-11 MED ORDER — WARFARIN SODIUM 4 MG PO TABS
4.0000 mg | ORAL_TABLET | Freq: Once | ORAL | Status: AC
  Administered 2021-12-11: 4 mg via ORAL
  Filled 2021-12-11: qty 1

## 2021-12-11 MED ORDER — WARFARIN - PHARMACIST DOSING INPATIENT: Freq: Every day | Status: DC

## 2021-12-11 NOTE — Progress Notes (Signed)
?  Transition of Care (TOC) Screening Note ? ? ?Patient Details  ?Name: Jose Bush ?Date of Birth: 1941/01/14 ? ? ?Transition of Care [facility]) CM/SW Contact:    ?Ella Bodo, RN ?Phone Number: ?12/11/2021, 3:50 PM ? ? ? ?Transition of Care Department [facility]) has reviewed patient and no TOC needs have been identified at this time. We will continue to monitor patient advancement through interdisciplinary progression rounds. If new patient transition needs arise, please place a TOC consult. ? ?Reinaldo Raddle, RN, BSN  ?Trauma/Neuro ICU Case Manager ?[phone] ? ?

## 2021-12-11 NOTE — Progress Notes (Signed)
Orthopedic Tech Progress Note ?Patient Details:  ?Jose Bush ?1941/01/13 ?[id number] ? ?Ortho Devices ?Type of Ortho Device: Postop shoe/boot ?Ortho Device/Splint Location: LLE ?Ortho Device/Splint Interventions: Ordered, Application, Adjustment ?  ?Post Interventions ?Patient Tolerated: Well ?Instructions Provided: Care of device ? ?[name] ?12/11/2021, 9:43 AM ? ?

## 2021-12-11 NOTE — Progress Notes (Signed)
ANTICOAGULATION CONSULT NOTE  ? ?Pharmacy Consult for Warfarin ?Indication: DVT and stroke ? ?Allergies  ?Allergen Reactions  ? Sulfa Antibiotics   ?  Weak and dizziness  ? ? ?Patient Measurements: ?Height: 5\' 8"  (172.7 cm) ?Weight: 91.9 kg (202 lb 9.6 oz) ?IBW/kg (Calculated) : 68.4 ? ?Vital Signs: ?Temp: 98.7 ?F (37.1 ?C) (03/13 1155) ?Temp Source: Oral (03/13 1155) ?BP: 132/58 (03/13 1155) ?Pulse Rate: 82 (03/13 1155) ? ?Labs: ?Recent Labs  ?  12/08/21 ?1434 12/08/21 ?2238 12/09/21 ?0723 12/09/21 ?0723 12/09/21 ?1001 12/10/21 ?02/09/22 12/10/21 ?1020 12/10/21 ?1431 12/10/21 ?1834 12/11/21 ?0239  ?HGB  --   --  9.6*   < >  --  8.8* 9.2* 8.5* 8.8* 7.9*  ?HCT  --   --  29.3*   < >  --  27.9* 29.7* 25.0* 28.4* 25.2*  ?PLT  --   --  229  --   --  255  --   --   --  282  ?LABPROT  --   --   --   --  15.1  --  14.7  --   --  13.9  ?INR  --   --   --   --  1.2  --  1.2  --   --  1.1  ?HEPARINUNFRC 0.19* 0.22* 0.27*  --   --   --   --   --   --   --   ? < > = values in this interval not displayed.  ? ? ?Estimated Creatinine Clearance: 79.7 mL/min (by C-G formula based on SCr of 0.73 mg/dL). ? ? ?Assessment: ?81 yo male who is now s/p tibial artery vein fistula bypass w/ repair 3/12. Pt was on warfarin PTA for a hx PAD/CVA/DVT. Pharmacy consulted to resume warfarin with no bridge. ? ?Current INR 1.1. No s/sx bleeding reported. CBC stable. Will resume PTA warfarin regimen.  ? ?Goal of Therapy:  ?INR 2-3 ?Monitor platelets by anticoagulation protocol: Yes ?  ?Plan:  ?Give warfarin 4 mg PO x1 dose ?Check INR daily while on warfarin ?Continue to monitor H&H and platelets ? ? ? ?Thank you for allowing pharmacy to be a part of this patient?s care. ? ?5/12, PharmD ?Clinical Pharmacist ? ?

## 2021-12-11 NOTE — Progress Notes (Addendum)
?Progress Note ? ? ? ?12/11/2021 ?6:59 AM ?1 Day Post-Op ? ?Subjective:  sitting up in bed eating breakfast.  Says his foot feels pretty good with only occasional pain.   ? ?Afebrile ?HR 70's-100's  ?[id number] systolic ?[id number] RA ? ?Vitals:  ? 12/11/21 0000 12/11/21 0433  ?BP: 116/61 (!) 123/52  ?Pulse: 86 76  ?Resp: 18 20  ?Temp: 98.1 ?F (36.7 ?C) 98.3 ?F (36.8 ?C)  ?SpO2: 94% 96%  ? ? ?Physical Exam: ?Cardiac:  regular ?Lungs:  non labored ?Incisions:  all incisions are clean.  Lower leg incisions with staples in tact.  Thigh incisions clean and dry ?Extremities:  brisk left DP doppler signal.  Left foot is warm and well perfused.  Necrotic tip left 2nd toe.  ? ? ?CBC ?   ?Component Value Date/Time  ? WBC 9.0 12/11/2021 0239  ? RBC 2.84 (L) 12/11/2021 0239  ? HGB 7.9 (L) 12/11/2021 0239  ? HCT 25.2 (L) 12/11/2021 0239  ? PLT 282 12/11/2021 0239  ? MCV 88.7 12/11/2021 0239  ? MCH 27.8 12/11/2021 0239  ? MCHC 31.3 12/11/2021 0239  ? RDW 15.6 (H) 12/11/2021 0239  ? LYMPHSABS 1.4 04/02/2018 1533  ? MONOABS 0.8 04/02/2018 1533  ? EOSABS 0.6 04/02/2018 1533  ? BASOSABS 0.0 04/02/2018 1533  ? ? ?BMET ?   ?Component Value Date/Time  ? NA 134 (L) 12/10/2021 1431  ? K 4.6 12/10/2021 1431  ? CL 105 12/07/2021 0224  ? CO2 25 12/07/2021 0224  ? GLUCOSE 117 (H) 12/07/2021 0224  ? BUN 12 12/07/2021 0224  ? CREATININE 0.73 12/07/2021 0224  ? CALCIUM 8.6 (L) 12/07/2021 0224  ? GFRNONAA >60 12/07/2021 0224  ? GFRAA >60 08/10/2019 1536  ? ? ?INR ?   ?Component Value Date/Time  ? INR 1.1 12/11/2021 0239  ? ? ? ?Intake/Output Summary (Last 24 hours) at 12/11/2021 0659 ?Last data filed at 12/10/2021 2000 ?Gross per 24 hour  ?Intake 1100 ml  ?Output 1600 ml  ?Net -500 ml  ? ? ? ?Assessment/Plan:  81 y.o. male is s/p:  ?1) left greater saphenous vein harvest ?2) creation of left posterior tibial artery and vein arteriovenous fistula ?3) left below knee popliteal artery to left posterior tibial artery vein fistula bypass ?4) left second toe  partial amputation  ?5 Days Post-Op ?and ?Repair of left popliteal to posterior tibial artery bypass graft proximal anastomosis  ?1 Day Post-Op ? ? ?-pt doing well this morning after returning to OR yesterday afternoon for bleeding.  He has a brisk doppler signal left DP and I have marked this as it is medial.  His foot is warm with sensory and motor in tact.   ?-acute blood loss anemia-pt hgb 8.  Will hold on transfusion currently as he is tolerating this.  Will check again tomorrow.  If he is drifting downward, may need transfusion. ?-his AC is on hold.  Dr. Stanford Breed to determine when to resume but most likely tomorrow.  Will not use Lovenox for transition bridge back to coumadin - will continue with heparin once AC resumed.  Discussed this with pt and his wife. ?-left 2nd toe most likely will require amputation at some point.  I discussed this with pt and his wife.  Also discussed that the bypass is patent but is at risk for failure at some point.  ?-will mobilize to chair today.   ? ? ?Leontine Locket, PA-C ?[facility] ?[phone] ?12/11/2021 ?6:59 AM ? ? ?VASCULAR STAFF ADDENDUM: ?  I have independently interviewed and examined the patient. ?I agree with the above.  ?Doing well after emergent takeback to OR for bleeding yesterday. ?Good doppler flow in the foot. ?Open toe amp is ischemic. Will allow this to demarcate. Likely outpatient amputation will be necessary. ?Will resume Coumadin today. No bridge with heparin / lovenox to lower risk of bleeding. ?Counseled again that he still has high risk of limb loss.  ? ?Yevonne Aline. Stanford Breed, MD ?[facility] [place] ?Office Phone Number: [phone] ?12/11/2021 9:34 AM ? ? ?

## 2021-12-11 NOTE — Progress Notes (Signed)
Physical Therapy Treatment ?Patient Details ?Name: Jose Bush ?MRN: [id number] ?DOB: 03/13/1941 ?Today's Date: 12/11/2021 ? ? ?History of Present Illness Pt is an 81 y/o male who presented 12/06/21 for evaluation of L 1st and 2nd toe ulceration with hx of PVD. Pt underwent L popliteal artery to posterior tibial artery bypass and partial L second toe amputation 3/8 PWB on left heel.  3/12 returned to OR for bleeding. PMH: R BKA, a fib, BPH, PAD, PE, CVA, shingles, GERD, aortic stenosis. ? ?  ?PT Comments  ? ? Pt progressing steadily towards his physical therapy goals. Min sanguineous drainage from proximal LLE incision prior to session; RN wrapped with kerlix and no additional drainage noted throughout. Pt able to participate in seated therapeutic exercises for BLE strengthening. Pt requiring heavy moderate assist for transfers and ambulating 12 ft with a walker and close chair follow. Cues provided for sequencing/technique and weightbearing precautions. Pt continues with generalized weakness, LLE pain, balance deficits, gait abnormalities. Recommend post acute rehab to address deficits, maximize functional mobility and decrease caregiver burden.  ?   ?Recommendations for follow up therapy are one component of a multi-disciplinary discharge planning process, led by the attending physician.  Recommendations may be updated based on patient status, additional functional criteria and insurance authorization. ? ?Follow Up Recommendations ? Acute inpatient rehab (3hours/day) ?  ?  ?Assistance Recommended at Discharge Frequent or constant Supervision/Assistance  ?Patient can return home with the following Two people to help with walking and/or transfers;Assistance with cooking/housework;Direct supervision/assist for medications management;Direct supervision/assist for financial management;Assist for transportation;Help with stairs or ramp for entrance;A lot of help with bathing/dressing/bathroom ?  ?Equipment  Recommendations ? None recommended by PT  ?  ?Recommendations for Other Services Rehab consult ? ? ?  ?Precautions / Restrictions Precautions ?Precautions: Fall ?Precaution Comments: hx of R BKA with prosthetic ?Required Braces or Orthoses:  (received ortho shoe) ?Other Brace:  (pt does not want to use [facility]) ?Restrictions ?Weight Bearing Restrictions: Yes ?LLE Weight Bearing: Partial weight bearing ?Other Position/Activity Restrictions: WB through heel  ?  ? ?Mobility ? Bed Mobility ?  ?  ?  ?  ?  ?  ?  ?General bed mobility comments: Pt sitting EOB upon arrival ?  ? ?Transfers ?Overall transfer level: Needs assistance ?Equipment used: Rolling walker (2 wheels) ?Transfers: Sit to/from Stand ?Sit to Stand: From elevated surface, Mod assist ?  ?  ?  ?  ?  ?General transfer comment: Heavy moderate assist to power up from elevated bed height, instruction for rocking to gain momentum, increased time to achieve glute activation and upright posture ?  ? ?Ambulation/Gait ?Ambulation/Gait assistance: Mod assist ?Gait Distance (Feet): 12 Feet ?Assistive device: Rollator (4 wheels) ?Gait Pattern/deviations: Step-to pattern ?Gait velocity: decr ?Gait velocity interpretation: <1.8 ft/sec, indicate of risk for recurrent falls ?  ?General Gait Details: Cues for sequencing, technique, weightbearing precautions. ModA for balance, slow and effortful pace. Increased trunk/hip flexion ? ? ?Stairs ?  ?  ?  ?  ?  ? ? ?Wheelchair Mobility ?  ? ?Modified Rankin (Stroke Patients Only) ?  ? ? ?  ?Balance Overall balance assessment: Needs assistance ?Sitting-balance support: Feet supported ?Sitting balance-Leahy Scale: Good ?  ?  ?Standing balance support: Bilateral upper extremity supported, During functional activity, Reliant on assistive device for balance ?Standing balance-Leahy Scale: Poor ?Standing balance comment: pt reliant on AD and external support ?  ?  ?  ?  ?  ?  ?  ?  ?  ?  ?  ?  ? ?  ?  Cognition Arousal/Alertness:  Awake/alert ?Behavior During Therapy: [facility] for tasks assessed/performed ?Overall Cognitive Status: Within Functional Limits for tasks assessed ?  ?  ?  ?  ?  ?  ?  ?  ?  ?  ?  ?  ?  ?  ?  ?  ?  ?  ?  ? ?  ?Exercises   ? ?  ?General Comments   ?  ?  ? ?Pertinent Vitals/Pain Pain Assessment ?Pain Assessment: Faces ?Faces Pain Scale: Hurts a little bit ?Pain Location: L foot ?Pain Descriptors / Indicators: Guarding, Discomfort ?Pain Intervention(s): Limited activity within patient's tolerance, Monitored during session  ? ? ?Home Living   ?  ?  ?  ?  ?  ?  ?  ?  ?  ?   ?  ?Prior Function    ?  ?  ?   ? ?PT Goals (current goals can now be found in the care plan section) Acute Rehab PT Goals ?Patient Stated Goal: to get better, go home ?PT Goal Formulation: With patient/family ?Time For Goal Achievement: 12/21/21 ?Potential to Achieve Goals: Good ?Progress towards PT goals: Progressing toward goals ? ?  ?Frequency ? ? ? Min 3X/week ? ? ? ?  ?PT Plan Current plan remains appropriate  ? ? ?Co-evaluation   ?  ?  ?  ?  ? ?  ?AM-PAC PT "6 Clicks" Mobility   ?Outcome Measure ? Help needed turning from your back to your side while in a flat bed without using bedrails?: A Lot ?Help needed moving from lying on your back to sitting on the side of a flat bed without using bedrails?: A Lot ?Help needed moving to and from a bed to a chair (including a wheelchair)?: A Lot ?Help needed standing up from a chair using your arms (e.g., wheelchair or bedside chair)?: A Lot ?Help needed to walk in hospital room?: A Lot ?Help needed climbing 3-5 steps with a railing? : Total ?6 Click Score: 11 ? ?  ?End of Session Equipment Utilized During Treatment: Gait belt;Other (comment) (post op shoe) ?Activity Tolerance: Patient tolerated treatment well ?Patient left: in chair;with call bell/phone within reach;with family/visitor present ?Nurse Communication: Mobility status ?PT Visit Diagnosis: Unsteadiness on feet (R26.81);Muscle weakness  (generalized) (M62.81);History of falling (Z91.81);Difficulty in walking, not elsewhere classified (R26.2);Pain ?Pain - Right/Left: Left ?Pain - part of body: Ankle and joints of foot ?  ? ? ?Time: [id number] ?PT Time Calculation (min) (ACUTE ONLY): 20 min ? ?Charges:  $Therapeutic Activity: 8-22 mins          ?          ? ?Wyona Almas, PT, DPT ?Acute Rehabilitation Services ?Pager [phone] ?Office [phone] ? ? ? ?Carloine Margo Aye ?12/11/2021, 2:06 PM ? ?

## 2021-12-11 NOTE — Progress Notes (Signed)
Patient very concerned this AM for potential bleeding from left leg after returning to OR yesterday due to bleeding from left calf incision. Patient sat up this AM and explained that he had slight oozing from left thigh incision.   ?Currently incision is dry and intact. For comfort placed dressing to left thigh incision as patient wishes to sit on side of bed. Patient adamantly declining to do therapy until he speaks with MD/PA regarding his leg.  ?Leg Now with thigh dressing, calf and ankle incision with staples open to air dry and intact. Lower extremity with the appearance of swelling also. All questions answered at this time for patient and spouse. Bedside RN in room and updated. Alvan Culpepper, Bettina Gavia ?RN  ?

## 2021-12-11 NOTE — Plan of Care (Signed)

## 2021-12-12 LAB — CBC
HCT: 21 % — ABNORMAL LOW (ref 39.0–52.0)
Hemoglobin: 6.8 g/dL — CL (ref 13.0–17.0)
MCH: 28.3 pg (ref 26.0–34.0)
MCHC: 32.4 g/dL (ref 30.0–36.0)
MCV: 87.5 fL (ref 80.0–100.0)
Platelets: 222 10*3/uL (ref 150–400)
RBC: 2.4 MIL/uL — ABNORMAL LOW (ref 4.22–5.81)
RDW: 15.1 % (ref 11.5–15.5)
WBC: 9.1 10*3/uL (ref 4.0–10.5)
nRBC: 0 % (ref 0.0–0.2)

## 2021-12-12 LAB — HEMOGLOBIN AND HEMATOCRIT, BLOOD
HCT: 27 % — ABNORMAL LOW (ref 39.0–52.0)
Hemoglobin: 8.7 g/dL — ABNORMAL LOW (ref 13.0–17.0)

## 2021-12-12 LAB — PROTIME-INR
INR: 1.1 (ref 0.8–1.2)
Prothrombin Time: 14.6 seconds (ref 11.4–15.2)

## 2021-12-12 MED ORDER — FLEET ENEMA 7-19 GM/118ML RE ENEM: 1.0000 | ENEMA | Freq: Once | RECTAL | Status: DC

## 2021-12-12 MED ORDER — WARFARIN SODIUM 5 MG PO TABS
5.0000 mg | ORAL_TABLET | Freq: Once | ORAL | Status: AC
  Administered 2021-12-12: 5 mg via ORAL
  Filled 2021-12-12: qty 1

## 2021-12-12 NOTE — PMR Pre-admission (Shared)
PMR Admission Coordinator Pre-Admission Assessment ? ?Patient: Jose Bush is an 81 y.o., male ?MRN: [id number] ?DOB: 1941-03-03 ?Height: 5\' 8"  (172.7 cm) ?Weight: 91.9 kg ? ?Insurance Information ?HMO: yes    PPO:      PCP:      IPA:      80/20:      OTHER:  ?PRIMARY: [employer]      Policy#:      Subscriber: pt ?CM Name: ***      Phone#: [phone] ext *     Fax#: [phone] ?Pre-Cert#: [id number]  initial denial. Appealed on 3/16    Employer:  ?Benefits:  Phone #: [phone]     Name: 3/14 ?Eff. Date: 10/02/2011     Deduct: none      Out of Pocket Max: $3400      Life Max: none ?CIR: $295 co pay per day days 1 until 6      SNF: no copay days 1 until 20; $196 co pay per day days 21 until 100 ?Outpatient: $10 to $20 per visit     Co-Pay: visits per medical neccesity ?Home Health: 100%      Co-Pay: visits per medical neccesity ?DME: 80%     Co-Pay: 20% ?Providers: in network ? ?SECONDARY: none ? ?Financial Counselor:       Phone#:  ? ?The ?Data Collection Information Summary? for patients in Inpatient Rehabilitation Facilities with attached ?Privacy Act Statement-Health Care Records? was provided and verbally reviewed with: Patient and Family ? ?Emergency Contact Information ?Contact Information   ? ? Name Relation Home Work Mobile  ? Cosio,Shirley Spouse [phone]  [phone]  ? [phone] Daughter   [phone]  ? Amrein,Bill Son   [phone]  ? ?  ? ?Current Medical History  ?Patient Admitting Diagnosis: Left Lower extremity bypass; debility ? ?History of Present Illness: 81 year old male with history of peripheral vascular disease, R BKA 2019, Left LE angiography and tibial angioplasty 4/22. Presented to [facility] on 12/06/21 with left first and second toe ulceration.Felt to be chronic limb threatening ischemia with high risk for amputation.  ? ?Underwent on 12/06/21 by Dr 02/05/22 left greater saphenous vein harvest, creation of left posterior tibial artery and venin  arteriovenous fistula, left below knee popliteal artery to left posterior tibial artery vein fistula bypass and left second toe partial amputation.  ?Postoperatively placed in Darco shoe for heel weight bearing only. DVT prophylaxis heparin gtt to transition to Coumadin. plan to let toe amputation to demarcate.monitoring left DP doppler signal . Acute blood loss anemia of 6.8 and to plan to transfuse and monitor. Pharmacy to manage coumadin transition.  ? ?Patient's medical record from [facility]  has been reviewed by the rehabilitation admission coordinator and physician. ? ?Past Medical History  ?Past Medical History:  ?Diagnosis Date  ? Aortic stenosis   ? Aortic sclerosis without stenosis on echo 8/21  ? Arthritis   ? Atrial fibrillation (HCC)   ? BPH (benign prostatic hyperplasia)   ? Essential hypertension   ? GERD (gastroesophageal reflux disease)   ? Heart murmur   ? History of DVT (deep vein thrombosis)   ? Positive anticardiolipin  ? History of hiatal hernia   ? History of stroke 2010  ? Lupus (HCC)   ? PAD (peripheral artery disease) (HCC)   ? Right below-knee amputation in 2019  ? Pulmonary embolism (HCC)   ? hx of 10 years ago  ? Shingles   ? Stroke [facility])   ?  Tinnitus   ? Wears dentures   ? Wears glasses   ? ?Has the patient had major surgery during 100 days prior to admission? Yes ? ?Family History   ?family history includes CAD in his brother; Heart failure in his sister; Hypertension in an other family member; Stroke in his father and mother. ? ?Current Medications ? ?Current Facility-Administered Medications:  ?  0.9 %  sodium chloride infusion (Manually program via Guardrails IV Fluids), , Intravenous, Once, Rhyne, Samantha J, PA-C ?  0.9 %  sodium chloride infusion, 500 mL, Intravenous, Once PRN, Rhyne, Samantha J, PA-C ?  0.9 %  sodium chloride infusion, 10 mL/hr, Intravenous, Once, Rhyne, Samantha J, PA-C ?  acetaminophen (TYLENOL) tablet 325-650 mg, 325-650 mg, Oral, Q4H PRN, 650 mg at  12/15/21 0955 **OR** acetaminophen (TYLENOL) suppository 325-650 mg, 325-650 mg, Rectal, Q4H PRN, Rhyne, Samantha J, PA-C ?  alum & mag hydroxide-simeth (MAALOX/MYLANTA) 200-200-20 MG/5ML suspension 15-30 mL, 15-30 mL, Oral, Q2H PRN, Rhyne, Samantha J, PA-C ?  docusate sodium (COLACE) capsule 100 mg, 100 mg, Oral, Daily, Rhyne, Samantha J, PA-C, 100 mg at 12/15/21 [id number] ?  guaiFENesin-dextromethorphan (ROBITUSSIN DM) 100-10 MG/5ML syrup 15 mL, 15 mL, Oral, Q4H PRN, Rhyne, Samantha J, PA-C ?  hydrALAZINE (APRESOLINE) injection 5 mg, 5 mg, Intravenous, Q20 Min PRN, Rhyne, Samantha J, PA-C ?  lisinopril (ZESTRIL) tablet 10 mg, 10 mg, Oral, Daily, 10 mg at 12/15/21 0814 **AND** hydrochlorothiazide (HYDRODIURIL) tablet 12.5 mg, 12.5 mg, Oral, Daily, Rhyne, Samantha J, PA-C, 12.5 mg at 12/15/21 [id number] ?  hydroxychloroquine (PLAQUENIL) tablet 200 mg, 200 mg, Oral, q AM, Rhyne, Samantha J, PA-C, 200 mg at 12/15/21 [id number] ?  labetalol (NORMODYNE) injection 10 mg, 10 mg, Intravenous, Q10 min PRN, Rhyne, Samantha J, PA-C ?  magnesium sulfate IVPB 2 g 50 mL, 2 g, Intravenous, Daily PRN, Rhyne, Samantha J, PA-C ?  melatonin tablet 10 mg, 10 mg, Oral, QHS, Rhyne, Samantha J, PA-C, 10 mg at 12/14/21 2221 ?  metoprolol tartrate (LOPRESSOR) injection 2-5 mg, 2-5 mg, Intravenous, Q2H PRN, Rhyne, Samantha J, PA-C ?  morphine (PF) 2 MG/ML injection 2 mg, 2 mg, Intravenous, Q2H PRN, Rhyne, Samantha J, PA-C, 2 mg at 12/11/21 0301 ?  ondansetron (ZOFRAN) injection 4 mg, 4 mg, Intravenous, Q6H PRN, Rhyne, Samantha J, PA-C ?  pantoprazole (PROTONIX) EC tablet 40 mg, 40 mg, Oral, Daily, Rhyne, Samantha J, PA-C, 40 mg at 12/15/21 [id number] ?  phenol (CHLORASEPTIC) mouth spray 1 spray, 1 spray, Mouth/Throat, PRN, Rhyne, Samantha J, PA-C ?  polyethylene glycol (MIRALAX / GLYCOLAX) packet 17 g, 17 g, Oral, Daily, Rhyne, Samantha J, PA-C, 17 g at 12/15/21 [id number] ?  potassium chloride SA (KLOR-CON M) CR tablet 20-40 mEq, 20-40 mEq, Oral, Daily PRN, Rhyne,  Samantha J, PA-C ?  predniSONE (DELTASONE) tablet 5 mg, 5 mg, Oral, Q breakfast, Rhyne, Samantha J, PA-C, 5 mg at 12/15/21 [id number] ?  sodium phosphate (FLEET) 7-19 GM/118ML enema 1 enema, 1 enema, Rectal, Once, Rhyne, Samantha J, PA-C ?  traMADol (ULTRAM) tablet 50 mg, 50 mg, Oral, Q8H PRN, Rhyne, Samantha J, PA-C, 50 mg at 12/15/21 0815 ?  warfarin (COUMADIN) tablet 6 mg, 6 mg, Oral, ONCE-1600, Earnie LarssonWilson, Frank R, RPH ?  Warfarin - Pharmacist Dosing Inpatient, , Does not apply, q1600, Leonie DouglasHawken, Thomas N, MD, Given at 12/11/21 1601 ? ?Patients Current Diet:  ?Diet Order   ? ?       ?  Diet regular Room service appropriate? Yes; Fluid consistency: Thin  Diet effective now       ?  ? ?  ?  ? ?  ? ?Precautions / Restrictions ?Precautions ?Precautions: Fall ?Precaution Comments: hx of R BKA with prosthetic, increased drainage from incisions with mobility ?Other Brace: L darco shoe (pt refusing darco shoe and prefers to use flat post-op shoe) ?Restrictions ?Weight Bearing Restrictions: Yes ?LLE Weight Bearing: Partial weight bearing ?LLE Partial Weight Bearing Percentage or Pounds:  (heel only) ?Other Position/Activity Restrictions: WB through heel  ? ?Has the patient had 2 or more falls or a fall with injury in the past year? No ? ?Prior Activity Level ?Limited Community (1-2x/wk): Mod I with RW and RLE prosthetic ? ?Prior Functional Level ?Self Care: Did the patient need help bathing, dressing, using the toilet or eating? Independent ? ?Indoor Mobility: Did the patient need assistance with walking from room to room (with or without device)? Independent ? ?Stairs: Did the patient need assistance with internal or external stairs (with or without device)? Needed some help ? ?Functional Cognition: Did the patient need help planning regular tasks such as shopping or remembering to take medications? Needed some help ? ?Patient Information ?Are you of Hispanic, Latino/a,or Spanish origin?: A. No, not of Hispanic, Latino/a, or Spanish  origin ?What is your race?: A. White ?Do you need or want an interpreter to communicate with a doctor or health care staff?: 0. No ? ?Patient's Response To:  ?Health Literacy and Transportation ?Is the patient a

## 2021-12-12 NOTE — TOC Initial Note (Signed)
Transition of Care (TOC) - Initial/Assessment Note  ? ? ?Patient Details  ?Name: Jose Bush ?MRN: [id number] ?Date of Birth: Sep 19, 1941 ? ?Transition of Care (TOC) CM/SW Contact:    ?Vinie Sill, LCSW ?Phone Number: ?12/12/2021, 2:45 PM ? ?Clinical Narrative:                 ? ?CSW met with patient at bedside. CSW introduced self and explained role. CSW discussed SNF as a back up plan if unable to discharge to CIR. Patient states he is agreeable to short term rehab at SNF as back up plan. Patient was unsure of preferred SNF and requested CSW speak with his wife.  ? ?CSW spoke with patient's wife, Enid Derry. She states she is unable to provide the level of care needed at this time- she expressed she really hoping the patient gets approved for CIR because she is concerned about him going to a SNF. However, she is agreeable to SNF as back up plan- she states she will call CSW with SNF names to send referrals. She did not want any referral sent in the [place] area.  ? ?CSW  will send referral once CSW has bee notified of choices. ?TOC will continue to follow and assist with discharge planning. ? ?Thurmond Butts, MSW, LCSW ?Clinical Social Worker ? ? ? ?Expected Discharge Plan: [place] ?Barriers to Discharge: Ship broker, SNF Pending bed offer ? ? ?Patient Goals and CMS Choice ?  ?  ?  ? ?Expected Discharge Plan and Services ?Expected Discharge Plan: [place] ?In-house Referral: Clinical Social Work ?  ?  ?Living arrangements for the past 2 months: [place] ?                ?  ?  ?  ?  ?  ?  ?  ?  ?  ?  ? ?Prior Living Arrangements/Services ?Living arrangements for the past 2 months: [place] ?Lives with:: Self, Spouse ?Patient language and need for interpreter reviewed:: No ?       ?  ?Care giver support system in place?: Yes (comment) ?  ?Criminal Activity/Legal Involvement Pertinent to Current Situation/Hospitalization: No - Comment as needed ? ?Activities  of Daily Living ?Home Assistive Devices/Equipment: Eyeglasses, Kasandra Knudsen (specify quad or straight), Walker (specify type), Blood pressure cuff, Grab bars in shower, Grab bars around toilet, Shower chair with back ?ADL Screening (condition at time of admission) ?Patient's cognitive ability adequate to safely complete daily activities?: Yes ?Is the patient deaf or have difficulty hearing?: No ?Does the patient have difficulty seeing, even when wearing glasses/contacts?: No ?Does the patient have difficulty concentrating, remembering, or making decisions?: No ?Patient able to express need for assistance with ADLs?: Yes ?Does the patient have difficulty dressing or bathing?: Yes ?Independently performs ADLs?: No ?Communication: Independent ?Dressing (OT): Independent ?Grooming: Independent ?Feeding: Independent ?Bathing: Needs assistance ?Is this a change from baseline?: Pre-admission baseline ?Toileting: Independent ?In/Out Bed: Independent with device (comment) ?Walks in Home: Independent with device (comment) ?Does the patient have difficulty walking or climbing stairs?: Yes ?Weakness of Legs: Both ?Weakness of Arms/Hands: None ? ?Permission Sought/Granted ?Permission sought to share information with : Family Supports ?Permission granted to share information with : Yes, Verbal Permission Granted ? Share Information with NAME: Rye Decoste ? Permission granted to share info w AGENCY: SNFs ? Permission granted to share info w Relationship: spouse ? Permission granted to share info w Contact Information: [phone] ? ?Emotional Assessment ?Appearance:: Appears  stated age ?Attitude/Demeanor/Rapport: Engaged ?Affect (typically observed): Accepting, Pleasant, Apprehensive ?Orientation: : Oriented to Self, Oriented to Place, Oriented to  Time, Oriented to Situation ?Alcohol / Substance Use: Not Applicable ?Psych Involvement: No (comment) ? ?Admission diagnosis:  Critical lower limb ischemia ([place]) [I70.229] ?Patient Active  Problem List  ? Diagnosis Date Noted  ? Critical lower limb ischemia ([place]) 12/06/2021  ? Closed wedge compression fracture of fifth lumbar vertebra ([place]) 11/28/2021  ? Body mass index (BMI) 33.0-33.9, adult 10/05/2021  ? Low testosterone 10/05/2021  ? Other long term (current) drug therapy 10/05/2021  ? Polyarthralgia 10/05/2021  ? BPH with urinary obstruction 04/20/2021  ? Pain of left hip joint 11/02/2020  ? Non-healing wound of lower extremity 03/18/2018  ? Nonhealing surgical wound 03/12/2018  ? PAD (peripheral artery disease) ([place]) 03/03/2018  ? PVD (peripheral vascular disease) ([place]) 03/03/2018  ? History of total replacement of right hip 10/30/2017  ? Iron deficiency anemia   ? Atrophic gastritis without hemorrhage   ? OA (osteoarthritis) of hip 05/01/2017  ? OA (osteoarthritis) of knee 12/13/2014  ? Other pancytopenia ([place]) 02/04/2013  ? Fever 02/02/2013  ? Neutropenia ([place]) 02/02/2013  ? Weakness 02/01/2013  ? Rigors 02/01/2013  ? Headache(784.0) 02/01/2013  ? Lupus ([place]) 02/01/2013  ? Thrombocytopenia ([place]) 02/01/2013  ? DVT (deep venous thrombosis) ([place])   ? HYPERTENSION, UNSPECIFIED 06/22/2009  ? CHEST PAIN-UNSPECIFIED 06/22/2009  ? ?PCP:  Lanelle Bal, PA-C ?Pharmacy:   ?[place] [address] ?[place] [address] ?Phone: [phone] Fax: [phone] ? ?[place] [place] ?[place] [address] ?Phone: [phone] Fax: [phone] ? ? ? ? ?Social Determinants of Health (SDOH) Interventions ?  ? ?Readmission Risk Interventions ?No flowsheet data found. ? ? ?

## 2021-12-12 NOTE — Progress Notes (Signed)
Physical Therapy Treatment ?Patient Details ?Name: Jose Bush ?MRN: [id number] ?DOB: 1941/02/06 ?Today's Date: 12/12/2021 ? ? ?History of Present Illness Pt is an 81 y/o male who presented 12/06/21 for evaluation of L 1st and 2nd toe ulceration with hx of PVD. Pt underwent L popliteal artery to posterior tibial artery bypass and partial L second toe amputation 3/8 PWB on left heel.  3/12 returned to OR for bleeding. PMH: R BKA, a fib, BPH, PAD, PE, CVA, shingles, GERD, aortic stenosis. ? ?  ?PT Comments  ? ? Pt received up seated EOB, agreeable to therapy session and with good participation in short household distance gait trial. Pt self-directed and needing increased time/effort and mod safety cues for transfer technique due to posterior bias and difficulty achieving fully upright posture even with BUE support of rollator. Pt defers to trial RW to see if this device is more stable for him as he has to maintain rollator wheels locked for stability and this makes it difficult for him to propel device. Pt HR to 126 bpm with exertion during gait and needing mod to maxA for sit<>stand transfers from elevated surface height. Pt unable to stand from lowest bed height. Pt reports 7/10 modified RPE (fatigue) at end of session, and pt had worked with mobility tech just prior to PTA initiation of session and with OT earlier in day. Pt continues to benefit from PT services to progress toward functional mobility goals.   ?Recommendations for follow up therapy are one component of a multi-disciplinary discharge planning process, led by the attending physician.  Recommendations may be updated based on patient status, additional functional criteria and insurance authorization. ? ?Follow Up Recommendations ? Acute inpatient rehab (3hours/day) ?  ?  ?Assistance Recommended at Discharge Frequent or constant Supervision/Assistance  ?Patient can return home with the following Two people to help with walking and/or transfers;Assistance  with cooking/housework;Direct supervision/assist for medications management;Direct supervision/assist for financial management;Assist for transportation;Help with stairs or ramp for entrance;A lot of help with bathing/dressing/bathroom ?  ?Equipment Recommendations ? None recommended by PT  ?  ?Recommendations for Other Services   ? ? ?  ?Precautions / Restrictions Precautions ?Precautions: Fall ?Precaution Comments: hx of R BKA with prosthetic, increased drainage from incisions with mobility ?Required Braces or Orthoses: Other Brace ?Other Brace: L darco shoe (pt refusing darco shoe and prefers to use flat post-op shoe) ?Restrictions ?Weight Bearing Restrictions: Yes ?LLE Weight Bearing: Partial weight bearing ?Other Position/Activity Restrictions: WB through heel  ?  ? ?Mobility ? Bed Mobility ?Overal bed mobility: Needs Assistance ?  ?  ?  ?  ?  ?  ?General bed mobility comments: pt seated EOB on PTA arrival to room ?  ? ?Transfers ?Overall transfer level: Needs assistance ?Equipment used: Rollator (4 wheels) ?Transfers: Sit to/from Stand ?Sit to Stand: +2 physical assistance, Max assist, From elevated surface, Mod assist ?  ?  ?  ?  ?  ?General transfer comment: Pt with (A) to power up from chair with posterior bias. Pt requies (A) for anterior weight shift. Pt with poor understanding of use of momentum strategy, stands better with bed height very elevated. ?  ? ?Ambulation/Gait ?Ambulation/Gait assistance: Min assist, +2 safety/equipment ?Gait Distance (Feet): 28 Feet ?Assistive device: Rollator (4 wheels) ?Gait Pattern/deviations: Step-to pattern, Trunk flexed, Wide base of support, Shuffle, Decreased dorsiflexion - left ?  ?  ?  ?General Gait Details: Cues for sequencing, technique, weightbearing precautions. MinA for balance, slow and effortful pace. Increased trunk/hip  flexion and pt maintains rounded shoulder/forward head posture. Pt maintains rollator wheels locked and defers to attempt with RW support  but it does seem effortful for him to push rollator with wheels locked, he refuses to attempt with unlocked wheels or RW. Chair follow for safety. ? ? ?Stairs ?  ?  ?  ?  ?  ? ? ?Wheelchair Mobility ?  ? ?Modified Rankin (Stroke Patients Only) ?  ? ? ?  ?Balance Overall balance assessment: Needs assistance ?Sitting-balance support: Bilateral upper extremity supported, Feet supported ?Sitting balance-Leahy Scale: Good ?  ?  ?Standing balance support: Bilateral upper extremity supported, During functional activity, Reliant on assistive device for balance ?Standing balance-Leahy Scale: Poor ?Standing balance comment: pt reliant on AD and external support ?  ?  ?  ?  ?  ?  ?  ?  ?  ?  ?  ?  ? ?  ?Cognition Arousal/Alertness: Awake/alert ?Behavior During Therapy: [facility] for tasks assessed/performed ?Overall Cognitive Status: Within Functional Limits for tasks assessed ?Area of Impairment: Safety/judgement, Problem solving ?  ?  ?  ?  ?  ?  ?  ?  ?  ?  ?  ?Following Commands: Follows one step commands consistently ?  ?Awareness: Emergent ?Problem Solving: Requires verbal cues, Requires tactile cues ?General Comments: Pt self-directed and uninterested in trying things a different way to see if it makes it easier for him to mobilize. ?  ?  ? ?  ?Exercises   ? ?  ?General Comments General comments (skin integrity, edema, etc.): pt with decline of darco shoe and likely increase WB on L foot due to flat post op shoe (increased drainage observed at pt L anke incision post-ambulation; pt HR 103 bpm seated EOB and up to 126 bpm with exertion ?  ?  ? ?Pertinent Vitals/Pain Pain Assessment ?Pain Assessment: 0-10 ?Faces Pain Scale: Hurts even more ?Pain Location: L foot ?Pain Descriptors / Indicators: Guarding, Discomfort ?Pain Intervention(s): Monitored during session, Repositioned, Premedicated before session  ? ? ?Home Living   ?  ?  ?  ?  ?  ?  ?  ?  ?  ?   ?  ?Prior Function    ?  ?  ?   ? ?PT Goals (current goals can now be found  in the care plan section) Acute Rehab PT Goals ?Patient Stated Goal: to get better, go home ?PT Goal Formulation: With patient/family ?Time For Goal Achievement: 12/21/21 ?Progress towards PT goals: Progressing toward goals ? ?  ?Frequency ? ? ? Min 3X/week ? ? ? ?  ?PT Plan Current plan remains appropriate  ? ? ?Co-evaluation   ?  ?  ?  ?  ? ?  ?AM-PAC PT "6 Clicks" Mobility   ?Outcome Measure ? Help needed turning from your back to your side while in a flat bed without using bedrails?: A Lot ?Help needed moving from lying on your back to sitting on the side of a flat bed without using bedrails?: A Lot ?Help needed moving to and from a bed to a chair (including a wheelchair)?: A Lot ?Help needed standing up from a chair using your arms (e.g., wheelchair or bedside chair)?: A Lot ?Help needed to walk in hospital room?: A Lot ?Help needed climbing 3-5 steps with a railing? : Total (mod cues) ?6 Click Score: 11 ? ?  ?End of Session Equipment Utilized During Treatment: Gait belt ?Activity Tolerance: Patient tolerated treatment well ?Patient left: in bed;with call  bell/phone within reach;with bed alarm set;Other (comment) (LLE heel floated) ?Nurse Communication: Mobility status;Other (comment) (pt with increased drainage at incision site at L foot/heel after ambulation, post-op shoe showing new drainage) ?PT Visit Diagnosis: Unsteadiness on feet (R26.81);Muscle weakness (generalized) (M62.81);History of falling (Z91.81);Difficulty in walking, not elsewhere classified (R26.2);Pain ?Pain - Right/Left: Left ?Pain - part of body: Ankle and joints of foot ?  ? ? ?Time: 1450-1519 ?PT Time Calculation (min) (ACUTE ONLY): 29 min ? ?Charges:  $Gait Training: 8-22 mins ?$Therapeutic Activity: 8-22 mins          ?          ? ?Randall Rampersad P., PTA ?Acute Rehabilitation Services ?Pager: [phone] ?Office: [phone]  ? ? ?Jaxsin Bottomley M [place] ?12/12/2021, 4:34 PM ? ?

## 2021-12-12 NOTE — Progress Notes (Signed)
ANTICOAGULATION CONSULT NOTE  ? ?Pharmacy Consult for Warfarin ?Indication: DVT and stroke ? ?Allergies  ?Allergen Reactions  ? Sulfa Antibiotics   ?  Weak and dizziness  ? ? ?Patient Measurements: ?Height: 5\' 8"  (172.7 cm) ?Weight: 91.9 kg (202 lb 9.6 oz) ?IBW/kg (Calculated) : 68.4 ? ?Vital Signs: ?Temp: 97.6 ?F (36.4 ?C) (03/14 [id number]) ?Temp Source: Oral (03/14 [id number]) ?BP: 174/63 (03/14 0727) ?Pulse Rate: 77 (03/14 0727) ? ?Labs: ?Recent Labs  ?  12/10/21 ?[id number] 12/10/21 ?1020 12/10/21 ?1431 12/11/21 ?0239 12/12/21 ?0128 12/12/21 ?[id number]  ?HGB 8.8* 9.2*   < > 7.9* 6.8* 8.7*  ?HCT 27.9* 29.7*   < > 25.2* 21.0* 27.0*  ?PLT 255  --   --  282 222  --   ?LABPROT  --  14.7  --  13.9 14.6  --   ?INR  --  1.2  --  1.1 1.1  --   ? < > = values in this interval not displayed.  ? ? ? ?Estimated Creatinine Clearance: 79.7 mL/min (by C-G formula based on SCr of 0.73 mg/dL). ? ? ?Assessment: ?81 yo male who is now s/p tibial artery vein fistula bypass w/ repair 3/12. Pt was on warfarin PTA for a hx PAD/CVA/DVT. Pharmacy consulted to resume warfarin with no bridge. ? ?Prior to admission warfarin dose was 4mg  daily.  ? ?INR unchanged at 1.1 after restarting warfarin yesterday. No s/sx bleeding reported. Hgb improved to 8.7 after transfusion.  ? ?Goal of Therapy:  ?INR 2-3 ?Monitor platelets by anticoagulation protocol: Yes ?  ?Plan:  ?Give warfarin 5 mg PO x1  ?Ceck INR daily while on warfarin ?Continue to monitor H&H and platelets ? ? ? ?Thank you for allowing pharmacy to be a part of this patient?s care. ? ?Erin Hearing PharmD., BCPS ?Clinical Pharmacist ?12/12/2021 9:27 AM ? ?

## 2021-12-12 NOTE — Progress Notes (Signed)
Inpatient Rehab Admissions Coordinator:  ? ?Discussed case with TOC and medical team.  I will place a rehab consult per our protocol. ? ?Estill Dooms, PT, DPT ?Admissions Coordinator ?[phone] ?12/12/21  ?9:14 AM ? ?

## 2021-12-12 NOTE — Progress Notes (Addendum)
?  Progress Note ? ? ? ?12/12/2021 ?6:54 AM ?2 Days Post-Op ? ?Subjective:  sitting on edge of bed.  Looks good this morning.  Says his foot feels pretty good.   ? ?Afebrile ?HR 60's-90's  ?[id number] systolic ?[id number] RA ? ?Vitals:  ? 12/12/21 0300 12/12/21 0512  ?BP: (!) 133/57 (!) 126/54  ?Pulse: (!) 59 (!) 56  ?Resp: 16 12  ?Temp: 98.2 ?F (36.8 ?C) 97.7 ?F (36.5 ?C)  ?SpO2: 98% 99%  ? ? ?Physical Exam: ?Cardiac:  regular ?Lungs:  non labored ?Incisions:  lower leg incisions are clean with staples in tact ?Extremities:  brisk left DP doppler signal; toe amp site stable without evidence of infection. ? ? ?CBC ?   ?Component Value Date/Time  ? WBC 9.1 12/12/2021 0128  ? RBC 2.40 (L) 12/12/2021 0128  ? HGB 6.8 (LL) 12/12/2021 0128  ? HCT 21.0 (L) 12/12/2021 0128  ? PLT 222 12/12/2021 0128  ? MCV 87.5 12/12/2021 0128  ? MCH 28.3 12/12/2021 0128  ? MCHC 32.4 12/12/2021 0128  ? RDW 15.1 12/12/2021 0128  ? LYMPHSABS 1.4 04/02/2018 1533  ? MONOABS 0.8 04/02/2018 1533  ? EOSABS 0.6 04/02/2018 1533  ? BASOSABS 0.0 04/02/2018 1533  ? ? ?BMET ?   ?Component Value Date/Time  ? NA 134 (L) 12/10/2021 1431  ? K 4.6 12/10/2021 1431  ? CL 105 12/07/2021 0224  ? CO2 25 12/07/2021 0224  ? GLUCOSE 117 (H) 12/07/2021 0224  ? BUN 12 12/07/2021 0224  ? CREATININE 0.73 12/07/2021 0224  ? CALCIUM 8.6 (L) 12/07/2021 0224  ? GFRNONAA >60 12/07/2021 0224  ? GFRAA >60 08/10/2019 1536  ? ? ?INR ?   ?Component Value Date/Time  ? INR 1.1 12/12/2021 0128  ? ? ? ?Intake/Output Summary (Last 24 hours) at 12/12/2021 0654 ?Last data filed at 12/12/2021 [id number] ?Gross per 24 hour  ?Intake 1126 ml  ?Output 800 ml  ?Net 326 ml  ? ? ? ?Assessment/Plan:  81 y.o. male is s/p:  ?1) left greater saphenous vein harvest ?2) creation of left posterior tibial artery and vein arteriovenous fistula ?3) left below knee popliteal artery to left posterior tibial artery vein fistula bypass ?4) left second toe partial amputation  ?6 Days Post-Op ?and ?Repair of left popliteal  to posterior tibial artery bypass graft proximal anastomosis   ?2 Days Post-Op ? ? ?-pt continues to have brisk left DP doppler signal and his left foot pain is subjectively better.   ?-he has been doing well with PT and they are recommending CIR, which he would be an excellent candidate and we could continue to monitor his leg while he is rehabbing.   ?-acute blood loss anemia-hgb 6.8 this am and ordered to transfuse one unit. Recheck of hgb after transfusion is 8.7. ?-coumadin per pharmacy-INR today is 1.1 ?-DVT prophylaxis:  none given high risk of bleeding ? ? ?Leontine Locket, PA-C ?[facility] ?[phone] ?12/12/2021 ?6:54 AM ? ? ?VASCULAR STAFF ADDENDUM: ?I have independently interviewed and examined the patient. ?I agree with the above.  ?Progressing well. ?Will resume Coumadin.  ?Plan transfer to inpatient rehab pending insurance approval. ?Mobilize as able. ? ?Yevonne Aline. Stanford Breed, MD ?[facility] [place] ?Office Phone Number: [phone] ?12/12/2021 6:54 PM ? ? ?

## 2021-12-12 NOTE — Plan of Care (Signed)

## 2021-12-12 NOTE — Progress Notes (Signed)
Occupational Therapy Treatment ?Patient Details ?Name: Jose Bush ?MRN: [id number] ?DOB: [birth year] ?Today's Date: 12/12/2021 ? ? ?History of present illness Pt is an 81 y/o male who presented 12/06/21 for evaluation of L 1st and 2nd toe ulceration with hx of PVD. Pt underwent L popliteal artery to posterior tibial artery bypass and partial L second toe amputation 3/8 PWB on left heel.  3/12 returned to OR for bleeding. PMH: R BKA, a fib, BPH, PAD, PE, CVA, shingles, GERD, aortic stenosis. ?  ?OT comments ? Pt currently able to transfer total +2 mod (A) from chair to [facility] with rollator in locked position. Pt very fixated on INR levels and needing 2.0 to advance his activity tolerance for all therapy attempts. Pt in chair with lunch at end of session. Recommendation CIR   ? ?Recommendations for follow up therapy are one component of a multi-disciplinary discharge planning process, led by the attending physician.  Recommendations may be updated based on patient status, additional functional criteria and insurance authorization. ?   ?Follow Up Recommendations ? Acute inpatient rehab (3hours/day)  ?  ?Assistance Recommended at Discharge Frequent or constant Supervision/Assistance  ?Patient can return home with the following ? Two people to help with walking and/or transfers;Two people to help with bathing/dressing/bathroom ?  ?Equipment Recommendations ? Other (comment)  ?  ?Recommendations for Other Services Rehab consult ? ?  ?Precautions / Restrictions Precautions ?Precautions: Fall ?Precaution Comments: hx of R BKA with prosthetic ?Restrictions ?Weight Bearing Restrictions: Yes ?LLE Weight Bearing: Partial weight bearing ?Other Position/Activity Restrictions: WB through heel  ? ? ?  ? ?Mobility Bed Mobility ?  ?  ?  ?  ?  ?  ?  ?General bed mobility comments: oob on arrival in chair ?  ? ?Transfers ?Overall transfer level: Needs assistance ?Equipment used: Rollator (4 wheels) ?Transfers: Sit to/from Stand ?Sit to  Stand: +2 physical assistance, Max assist ?  ?  ?Step pivot transfers: +2 physical assistance, Mod assist ?  ?  ?General transfer comment: pt with (A) to power up from chair with posterior bias. pt requies (A) for anterior weight shift. Pt side stepping to [facility]. pt with (A) to control descend. pt requires bil UE to push off BSC to static stand with posterior bias again ?  ?  ?Balance Overall balance assessment: Needs assistance ?Sitting-balance support: Bilateral upper extremity supported, Feet supported ?Sitting balance-Leahy Scale: Good ?  ?  ?Standing balance support: Bilateral upper extremity supported, During functional activity, Reliant on assistive device for balance ?Standing balance-Leahy Scale: Poor ?  ?  ?  ?  ?  ?  ?  ?  ?  ?  ?  ?  ?   ? ?ADL either performed or assessed with clinical judgement  ? ?ADL Overall ADL's : Needs assistance/impaired ?Eating/Feeding: Set up;Sitting ?  ?Grooming: Set up;Sitting ?  ?  ?  ?Lower Body Bathing: +2 for physical assistance;Maximal assistance;Sit to/from stand ?  ?  ?  ?Lower Body Dressing: +2 for physical assistance;Maximal assistance;Sit to/from stand ?  ?Toilet Transfer: +2 for physical assistance;Moderate assistance;Stand-pivot ?  ?  ?  ?  ?  ?  ?General ADL Comments: pt using rollator locked and pushing for stand pivot attempt. pt with posterior bias and needs anterior weight shift of therapist. pt insist on post op shoe and declined darco shoe ?  ? ?Extremity/Trunk Assessment Upper Extremity Assessment ?LUE Deficits / Details: hx of CVA with residual weakness, able to use functionally ?  ?Lower  Extremity Assessment ?Lower Extremity Assessment: LLE deficits/detail ?LLE Deficits / Details: weeping drainage at this time and edema present ?  ?  ?  ? ?Vision   ?  ?  ?Perception   ?  ?Praxis   ?  ? ?Cognition Arousal/Alertness: Awake/alert ?Behavior During Therapy: [facility] for tasks assessed/performed ?Overall Cognitive Status: Within Functional Limits for tasks assessed ?   ?  ?  ?  ?  ?  ?  ?  ?  ?  ?  ?  ?  ?  ?  ?  ?General Comments: pt very fixated on return to INR levels 2.0 stating "i can't do nothing until that gets back to 2" Pt needs redirection to task and educated why. ?  ?  ?   ?Exercises   ? ?  ?Shoulder Instructions   ? ? ?  ?General Comments pt with decline of darco shoe and likely increase WB on L foot due to post op shoe  ? ? ?Pertinent Vitals/ Pain       Pain Assessment ?Pain Assessment: Faces ?Faces Pain Scale: Hurts a little bit ?Pain Location: L foot ?Pain Descriptors / Indicators: Guarding, Discomfort ?Pain Intervention(s): Monitored during session, Repositioned ? ?Home Living   ?  ?  ?  ?  ?  ?  ?  ?  ?  ?  ?  ?  ?  ?  ?  ?  ?  ?  ? ?  ?Prior Functioning/Environment    ?  ?  ?  ?   ? ?Frequency ? Min 2X/week  ? ? ? ? ?  ?Progress Toward Goals ? ?OT Goals(current goals can now be found in the care plan section) ? Progress towards OT goals: Progressing toward goals ? ?Acute Rehab OT Goals ?Patient Stated Goal: to get INR to 2.0 ?OT Goal Formulation: With patient/family ?Time For Goal Achievement: 12/21/21 ?Potential to Achieve Goals: Good ?ADL Goals ?Pt Will Perform Lower Body Bathing: with min assist;sitting/lateral leans;sit to/from stand ?Pt Will Transfer to Toilet: with min assist;ambulating ?Pt Will Perform Toileting - Clothing Manipulation and hygiene: with min assist;sitting/lateral leans;sit to/from stand  ?Plan Discharge plan remains appropriate   ? ?Co-evaluation ? ? ?   ?  ?  ?  ?  ? ?  ?AM-PAC OT "6 Clicks" Daily Activity     ?Outcome Measure ? ? Help from another person eating meals?: A Little ?Help from another person taking care of personal grooming?: A Little ?Help from another person toileting, which includes using toliet, bedpan, or urinal?: A Lot ?Help from another person bathing (including washing, rinsing, drying)?: A Lot ?Help from another person to put on and taking off regular upper body clothing?: A Little ?Help from another person to put  on and taking off regular lower body clothing?: A Lot ?6 Click Score: 15 ? ?  ?End of Session Equipment Utilized During Treatment: Gait belt;Rollator (4 wheels) ? ?OT Visit Diagnosis: Unsteadiness on feet (R26.81);Other abnormalities of gait and mobility (R26.89);Muscle weakness (generalized) (M62.81) ?  ?Activity Tolerance Patient tolerated treatment well ?  ?Patient Left in chair;with call bell/phone within reach ?  ?Nurse Communication Mobility status;Precautions;Weight bearing status ?  ? ?   ? ?Time: [id number] ?OT Time Calculation (min): 25 min ? ?Charges: OT General Charges ?$OT Visit: 1 Visit ?OT Treatments ?$Self Care/Home Management : 23-37 mins ? ? ?Brynn, OTR/L  ?Acute Rehabilitation Services ?Pager: [phone] ?Office: [phone] ?. ? ? ?Jeri Modena ?12/12/2021, 11:30 AM ?

## 2021-12-12 NOTE — Progress Notes (Signed)
Lab advised patient has a critical Hgb 6.8. Contacted MD on call, Karin Lieu and was advised to transfuse 1 unit.  ?

## 2021-12-12 NOTE — Progress Notes (Signed)
Inpatient Rehabilitation Admissions Coordinator  ? ?I met at bedside with patient for rehab assessment. We discussed goals and expectations of a possible CIR admit. He prefers CIR rather than SNF. I will begin insurance Auth with [facility] for possible admit. ? ?Danne Baxter, RN, MSN ?Rehab Admissions Coordinator ?[phone] ?12/12/2021 1:15 PM ? ?

## 2021-12-12 NOTE — Progress Notes (Signed)
Mobility Specialist Progress Note ? ? 12/12/21 1400  ?Mobility  ?Activity Dangled on edge of bed  ?Level of Assistance Moderate assist, patient does 50-74%  ?Assistive Device Four wheel walker  ?LLE Weight Bearing PWB  ?Distance Ambulated (ft) 2 ft  ?Activity Response Tolerated well  ?$Mobility charge 1 Mobility  ? ?Received pt in chair requesting to get back in bed. Pt able to don prosthetic prior to tx. [place] on initial stand following MinA for ambulation. Placed on EOB as PT walked in to do a session. Assisted PT w/ chair follow for ambulation in room and then returned pt back to EOB.   ? ?Holland Falling ?Mobility Specialist ?Phone Number [phone] ? ?

## 2021-12-13 LAB — CBC
HCT: 25.2 % — ABNORMAL LOW (ref 39.0–52.0)
Hemoglobin: 8.2 g/dL — ABNORMAL LOW (ref 13.0–17.0)
MCH: 28.2 pg (ref 26.0–34.0)
MCHC: 32.5 g/dL (ref 30.0–36.0)
MCV: 86.6 fL (ref 80.0–100.0)
Platelets: 237 10*3/uL (ref 150–400)
RBC: 2.91 MIL/uL — ABNORMAL LOW (ref 4.22–5.81)
RDW: 15.7 % — ABNORMAL HIGH (ref 11.5–15.5)
WBC: 9.5 10*3/uL (ref 4.0–10.5)
nRBC: 0 % (ref 0.0–0.2)

## 2021-12-13 LAB — PROTIME-INR
INR: 1.2 (ref 0.8–1.2)
Prothrombin Time: 14.8 seconds (ref 11.4–15.2)

## 2021-12-13 MED ORDER — WARFARIN SODIUM 5 MG PO TABS
6.0000 mg | ORAL_TABLET | Freq: Once | ORAL | Status: AC
  Administered 2021-12-13: 6 mg via ORAL
  Filled 2021-12-13: qty 1

## 2021-12-13 NOTE — Progress Notes (Signed)
Inpatient Rehabilitation Admissions Coordinator  ? ?I met at bedside with patient and his wife. We reviewed cost of care if CIR approved. I await insurance approval for possible Cir admit. ? ?Danne Baxter, RN, MSN ?Rehab Admissions Coordinator ?[phone] ?12/13/2021 11:45 AM ? ?

## 2021-12-13 NOTE — Progress Notes (Addendum)
?  Progress Note ? ? ? ?12/13/2021 ?8:34 AM ?3 Days Post-Op ? ?Subjective:  says he had some pain last night but better this morning.  ? ?Afebrile ?HR 60's-80's  ?Q000111Q systolic ?0000000 RA ? ?Vitals:  ? 12/13/21 0417 12/13/21 0727  ?BP: (!) 144/60 (!) 154/73  ?Pulse: 80 87  ?Resp: 15 (!) 21  ?Temp: 97.9 ?F (36.6 ?C) 98.4 ?F (36.9 ?C)  ?SpO2: 98% 97%  ? ? ?Physical Exam: ?Cardiac:  regular ?Lungs:  non labored ?Incisions:  all incisions look good; scant drainage on bandages ?Extremities:  +DP doppler signal ?Abdomen:  soft ? ?CBC ?   ?Component Value Date/Time  ? WBC 9.5 12/13/2021 0229  ? RBC 2.91 (L) 12/13/2021 0229  ? HGB 8.2 (L) 12/13/2021 0229  ? HCT 25.2 (L) 12/13/2021 0229  ? PLT 237 12/13/2021 0229  ? MCV 86.6 12/13/2021 0229  ? MCH 28.2 12/13/2021 0229  ? MCHC 32.5 12/13/2021 0229  ? RDW 15.7 (H) 12/13/2021 0229  ? LYMPHSABS 1.4 04/02/2018 1533  ? MONOABS 0.8 04/02/2018 1533  ? EOSABS 0.6 04/02/2018 1533  ? BASOSABS 0.0 04/02/2018 1533  ? ? ?BMET ?   ?Component Value Date/Time  ? NA 134 (L) 12/10/2021 1431  ? K 4.6 12/10/2021 1431  ? CL 105 12/07/2021 0224  ? CO2 25 12/07/2021 0224  ? GLUCOSE 117 (H) 12/07/2021 0224  ? BUN 12 12/07/2021 0224  ? CREATININE 0.73 12/07/2021 0224  ? CALCIUM 8.6 (L) 12/07/2021 0224  ? GFRNONAA >60 12/07/2021 0224  ? GFRAA >60 08/10/2019 1536  ? ? ?INR ?   ?Component Value Date/Time  ? INR 1.2 12/13/2021 0229  ? ? ? ?Intake/Output Summary (Last 24 hours) at 12/13/2021 0834 ?Last data filed at 12/13/2021 [id number] ?Gross per 24 hour  ?Intake 480 ml  ?Output 3200 ml  ?Net -2720 ml  ? ? ? ?Assessment/Plan:  81 y.o. male is s/p:  ?1) left greater saphenous vein harvest ?2) creation of left posterior tibial artery and vein arteriovenous fistula ?3) left below knee popliteal artery to left posterior tibial artery vein fistula bypass ?4) left second toe partial amputation  ?7 Days Post-Op ?and ?Repair of left popliteal to posterior tibial artery bypass graft proximal anastomosis   ?3 Days  Post-Op ? ? ?-pt continues to have brisk left DP doppler signal.  2nd toe continues to demarcate.   ?-continue working with PT and mobilizing. ?-CIR consult in process-hopeful he will be able to get there this week.  ?-DVT prophylaxis:  coumadin without heparin bridge.  ?-some drainage from incisions yesterday with mobilizing.  Okay to redress with kerlix and ace when mobilizing.  ? ? ?Leontine Locket, PA-C ?[facility] ?[phone] ?12/13/2021 ?8:34 AM ? ?VASCULAR STAFF ADDENDUM: ?I have independently interviewed and examined the patient. ?I agree with the above.  ?Doing well overall. ?Good doppler flow in foot. ?Awaiting insurance authorization for inpatient rehab. ? ?Yevonne Aline. Stanford Breed, MD ?[facility] [place] ?Office Phone Number: [phone] ?12/13/2021 5:04 PM ? ? ? ?

## 2021-12-13 NOTE — Progress Notes (Signed)
ANTICOAGULATION CONSULT NOTE  ? ?Pharmacy Consult for Warfarin ?Indication: DVT and stroke ? ?Allergies  ?Allergen Reactions  ? Sulfa Antibiotics   ?  Weak and dizziness  ? ? ?Patient Measurements: ?Height: 5\' 8"  (172.7 cm) ?Weight: 91.9 kg (202 lb 9.6 oz) ?IBW/kg (Calculated) : 68.4 ? ?Vital Signs: ?Temp: 98.4 ?F (36.9 ?C) (03/15 [id number]) ?Temp Source: Oral (03/15 [id number]) ?BP: 154/73 (03/15 0727) ?Pulse Rate: 87 (03/15 0727) ? ?Labs: ?Recent Labs  ?  12/11/21 ?0239 12/12/21 ?0128 12/12/21 ?0639 12/13/21 ?0229  ?HGB 7.9* 6.8* 8.7* 8.2*  ?HCT 25.2* 21.0* 27.0* 25.2*  ?PLT 282 222  --  237  ?LABPROT 13.9 14.6  --  14.8  ?INR 1.1 1.1  --  1.2  ? ? ? ?Estimated Creatinine Clearance: 79.7 mL/min (by C-G formula based on SCr of 0.73 mg/dL). ? ? ?Assessment: ?81 yo male who is now s/p tibial artery vein fistula bypass w/ repair 3/12. Pt was on warfarin PTA for a hx PAD/CVA/DVT. Pharmacy consulted to resume warfarin with no bridge. ? ?Prior to admission warfarin dose was 4mg  daily.  ? ?INR low at 1.2 after restarting warfarin 3/13. No s/sx bleeding reported. Hgb back down to 8.2 after transfusion yesterday.  ? ?Goal of Therapy:  ?INR 2-3 ?Monitor platelets by anticoagulation protocol: Yes ?  ?Plan:  ?Give warfarin 6 mg x1  ?Ceck INR daily while on warfarin ?Continue to monitor H&H and platelets ? ? ? ?Thank you for allowing pharmacy to be a part of this patient?s care. ? ?Erin Hearing PharmD., BCPS ?Clinical Pharmacist ?12/13/2021 9:59 AM ? ?

## 2021-12-13 NOTE — Care Management Important Message (Signed)
Important Message ? ?Patient Details  ?Name: Jose Bush ?MRN: [id number] ?Date of Birth: July 05, 1941 ? ? ?Medicare Important Message Given:  Yes ? ? ? ? ?[name] ?12/13/2021, 7:48 AM ?

## 2021-12-13 NOTE — Progress Notes (Signed)
Occupational Therapy Treatment ?Patient Details ?Name: Jose Bush ?MRN: [id number] ?DOB: 1941-04-22 ?Today's Date: 12/13/2021 ? ? ?History of present illness Pt is an 81 y/o male who presented 12/06/21 for evaluation of L 1st and 2nd toe ulceration with hx of PVD. Pt underwent L popliteal artery to posterior tibial artery bypass and partial L second toe amputation 3/8 PWB on left heel.  3/12 returned to OR for bleeding. PMH: R BKA, a fib, BPH, PAD, PE, CVA, shingles, GERD, aortic stenosis. ?  ?OT comments ? Pt progress from chair to LB dressing task for prosthetic and post op shoe. Pt with basic chair transfer total +2 mod (A) this session with posterior bias. Pt again refusing darco shoe. Recommendation for CIR at this time.   ? ?Recommendations for follow up therapy are one component of a multi-disciplinary discharge planning process, led by the attending physician.  Recommendations may be updated based on patient status, additional functional criteria and insurance authorization. ?   ?Follow Up Recommendations ? Acute inpatient rehab (3hours/day)  ?  ?Assistance Recommended at Discharge Frequent or constant Supervision/Assistance  ?Patient can return home with the following ? Two people to help with walking and/or transfers;Two people to help with bathing/dressing/bathroom ?  ?Equipment Recommendations ? Other (comment)  ?  ?Recommendations for Other Services Rehab consult ? ?  ?Precautions / Restrictions Precautions ?Precautions: Fall ?Precaution Comments: hx of R BKA with prosthetic, increased drainage from incisions with mobility ?Required Braces or Orthoses: Other Brace ?Other Brace: L darco shoe (pt refusing darco shoe and prefers to use flat post-op shoe) ?Restrictions ?Weight Bearing Restrictions: Yes ?LLE Weight Bearing: Partial weight bearing ?Other Position/Activity Restrictions: WB through heel  ? ? ?  ? ?Mobility Bed Mobility ?  ?  ?  ?  ?  ?  ?  ?General bed mobility comments: in chair on arrival ?   ? ?Transfers ?Overall transfer level: Needs assistance ?Equipment used: Rollator (4 wheels) ?Transfers: Sit to/from Stand ?Sit to Stand: +2 physical assistance, Mod assist ?  ?  ?  ?  ?  ?General transfer comment: pt power up from recliner this session. pt with blankets added to the base to increase seat height for easier sit<>stand transfer second attempt. ?  ?  ?Balance Overall balance assessment: Needs assistance ?Sitting-balance support: Bilateral upper extremity supported, Feet supported ?Sitting balance-Leahy Scale: Good ?  ?  ?Standing balance support: Bilateral upper extremity supported, During functional activity, Reliant on assistive device for balance ?Standing balance-Leahy Scale: Poor ?Standing balance comment: posterior bias ?  ?  ?  ?  ?  ?  ?  ?  ?  ?  ?  ?   ? ?ADL either performed or assessed with clinical judgement  ? ?ADL Overall ADL's : Needs assistance/impaired ?  ?  ?  ?  ?  ?  ?  ?  ?  ?  ?  ?  ?  ?  ?  ?  ?  ?  ?  ?General ADL Comments: Pt don doff prosthetic this session mod I in sitting. pt reports removing prosthetic for bed level ( sleeping). pt completes sit<>Stand x3 this session with basic transfer x2. ?  ? ?Extremity/Trunk Assessment Upper Extremity Assessment ?Upper Extremity Assessment: Generalized weakness ?  ?Lower Extremity Assessment ?LLE Deficits / Details: weeping from middle incision and the ankle area. pt reports concern due to weeping. pt educated to keep elevated. pt with legs down on arrival ?  ?  ?  ? ?  Vision   ?Additional Comments: wears glasses ?  ?Perception   ?  ?Praxis   ?  ? ?Cognition Arousal/Alertness: Awake/alert ?Behavior During Therapy: [facility] for tasks assessed/performed ?  ?Area of Impairment: Safety/judgement ?  ?  ?  ?  ?  ?  ?  ?  ?  ?  ?  ?  ?Safety/Judgement: Decreased awareness of deficits ?Awareness: Emergent ?  ?General Comments: pt declines darco shoe with education on why the shoe was recommended. pt express concerns for L ankle and staple but does  not agree with darco shoe. pt states "i cant use that" Pt with good return demo of don doff R prosthetic this sesison and able to reach L post op shoes he is wearing ?  ?  ?   ?Exercises   ? ?  ?Shoulder Instructions   ? ? ?  ?General Comments HR max 134 each attempt.  ? ? ?Pertinent Vitals/ Pain       Pain Assessment ?Pain Assessment: Faces ?Faces Pain Scale: Hurts little more ?Pain Location: L foot ?Pain Descriptors / Indicators: Discomfort, Guarding ?Pain Intervention(s): Monitored during session, Repositioned ? ?Home Living   ?  ?  ?  ?  ?  ?  ?  ?  ?  ?  ?  ?  ?  ?  ?  ?  ?  ?  ? ?  ?Prior Functioning/Environment    ?  ?  ?  ?   ? ?Frequency ? Min 2X/week  ? ? ? ? ?  ?Progress Toward Goals ? ?OT Goals(current goals can now be found in the care plan section) ? Progress towards OT goals: Progressing toward goals ? ?Acute Rehab OT Goals ?Patient Stated Goal: to get INR 2.0- pt states again if i could get my level to 2 i could do so much more ?OT Goal Formulation: With patient/family ?Time For Goal Achievement: 12/21/21 ?Potential to Achieve Goals: Good ?ADL Goals ?Pt Will Perform Lower Body Bathing: with min assist;sitting/lateral leans;sit to/from stand ?Pt Will Transfer to Toilet: with min assist;ambulating ?Pt Will Perform Toileting - Clothing Manipulation and hygiene: with min assist;sitting/lateral leans;sit to/from stand  ?Plan Discharge plan remains appropriate   ? ?Co-evaluation ? ? ?   ?  ?  ?  ?  ? ?  ?AM-PAC OT "6 Clicks" Daily Activity     ?Outcome Measure ? ? Help from another person eating meals?: A Little ?Help from another person taking care of personal grooming?: A Little ?Help from another person toileting, which includes using toliet, bedpan, or urinal?: A Little ?Help from another person bathing (including washing, rinsing, drying)?: A Lot ?Help from another person to put on and taking off regular upper body clothing?: A Little ?Help from another person to put on and taking off regular lower  body clothing?: A Lot ?6 Click Score: 16 ? ?  ?End of Session Equipment Utilized During Treatment: Gait belt;Rollator (4 wheels) (likes walker in locked positioin) ? ?OT Visit Diagnosis: Unsteadiness on feet (R26.81);Other abnormalities of gait and mobility (R26.89);Muscle weakness (generalized) (M62.81) ?  ?Activity Tolerance Patient tolerated treatment well ?  ?Patient Left in chair;with call bell/phone within reach ?  ?Nurse Communication Mobility status;Precautions;Weight bearing status ?  ? ?   ? ?Time: [id number] ?OT Time Calculation (min): 40 min ? ?Charges: OT General Charges ?$OT Visit: 1 Visit ?OT Treatments ?$Self Care/Home Management : 38-52 mins ? ? ?Brynn, OTR/L  ?Acute Rehabilitation Services ?Pager: [phone] ?Office: [phone] ?. ? ? ?  Mateo Flow ?12/13/2021, 4:09 PM ? ? ?

## 2021-12-13 NOTE — H&P (Incomplete)
? ? ?Physical Medicine and Rehabilitation Admission H&P ? ?  ? 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  Per chart review patient lives with spouse.  1 level home with ramped entrance.  Household ambulator with rollator and prosthesis.  Presented 12/06/2021 after being followed at the vascular center for left first and second toe ulceration.  Patient had undergone left lower extremity angiogram with tibial angioplasty April 2022 per Dr. Oneida Alar.  Over the past several weeks developed ulceration of that first and second toe.  Patient underwent left greater saphenous vein harvest creation of left posterior tibial artery and vein arteriovenous fistula with left below-knee popliteal artery to left posterior tibial artery vein fistula bypass.  Patient also underwent left second toe partial amputation 12/06/2021 per Dr. Stanford Breed.  Hospital course with chronic Coumadin resumed.  He remained on prednisone for history of lupus.  Acute blood loss anemia 6.8 he has been transfused latest hemoglobin 8.2.  Therapy evaluations completed due to patient decreased functional mobility was admitted for a comprehensive rehab program. ? ?Review of Systems  ?Constitutional:  Negative for chills and fever.  ?HENT:  Negative for hearing loss.   ?Eyes:  Negative for blurred vision and double vision.  ?Respiratory:  Negative for cough and shortness of breath.   ?Cardiovascular:  Positive for leg swelling. Negative for chest pain and palpitations.  ?Gastrointestinal:  Positive for constipation. Negative for heartburn, nausea and vomiting.  ?     GERD  ?Genitourinary:  Positive for urgency. Negative for dysuria, flank pain and hematuria.   ?Musculoskeletal:  Positive for myalgias.  ?Skin:  Negative for rash.  ?All other systems reviewed and are negative. ?Past Medical History:  ?Diagnosis Date  ? Aortic stenosis   ? Aortic sclerosis without stenosis on echo 8/21  ? Arthritis   ? Atrial fibrillation ([place])   ? BPH (benign prostatic hyperplasia)   ? Essential hypertension   ? GERD (gastroesophageal reflux disease)   ? Heart murmur   ? History of DVT (deep vein thrombosis)   ? Positive anticardiolipin  ? History of hiatal hernia   ? History of stroke 2010  ? Lupus ([place])   ? PAD (peripheral artery disease) ([place])   ? Right below-knee amputation in 2019  ? Pulmonary embolism ([place])   ? hx of 10 years ago  ? Shingles   ? Stroke [facility])   ? Tinnitus   ? Wears dentures   ? Wears glasses   ? ?Past Surgical History:  ?Procedure Laterality Date  ? ABDOMINAL AORTOGRAM Left 11/24/2021  ? Procedure: ABDOMINAL AORTOGRAM;  Surgeon: [name], MD;  Location: [place] CV LAB;  Service: Cardiovascular;  Laterality: Left;  ? ABDOMINAL AORTOGRAM W/LOWER EXTREMITY N/A 02/17/2018  ? Procedure: ABDOMINAL AORTOGRAM W/LOWER EXTREMITY;  Surgeon: [name], MD;  Location: [place] CV LAB;  Service: Cardiovascular;  Laterality: N/A;  ? ABDOMINAL AORTOGRAM W/LOWER EXTREMITY Left 12/30/2020  ? Procedure: ABDOMINAL AORTOGRAM W/LOWER EXTREMITY;  Surgeon: [name], MD;  Location: [place] CV LAB;  Service: Cardiovascular;  Laterality: Left;  ? AMPUTATION Right 03/03/2018  ? Procedure: AMPUTATION RIGHT SECOND TOE;  Surgeon: [name], MD;  Location: [facility] OR;  Service: Vascular;  Laterality: Right;  ? AMPUTATION Right 03/18/2018  ?  Procedure: AMPUTATION BELOW KNEE;  Surgeon: [name], MD;  Location: [facility] OR;  Service: Vascular;  Laterality: Right;  ? BELOW KNEE LEG AMPUTATION Right 03/18/2018  ? bilateral cataract surgery     ? BIOPSY N/A 10/22/2017  ? Procedure: BIOPSY;  Surgeon: [name], MD;  [facility];  Service:  Gastroenterology;  Laterality: N/A;  ? BYPASS GRAFT POPLITEAL TO TIBIAL Left 12/06/2021  ? Procedure: LEFT BELOW KNE POPLITEAL ARTEY TO LEFT POSTERIOR TIBIAL ARTERY VEIN FISTULA BYPASS WITH GREATER SAPHENOUS VEIN HARVEST;  Surgeon: [name], MD;  Location: [place];  Service: Vascular;  Laterality: Left;  ? CATARACT EXTRACTION Bilateral 09/20/2016  ? COLONOSCOPY N/A 09/18/2016  ? Dr. [name]: normal  ? ESOPHAGOGASTRODUODENOSCOPY N/A 10/22/2017  ? Dr. [name]: normal duodenum, gastric mucosal atrophy but negative H.pylori (Clotest negative), normal GE junction  ? FEMORAL-POPLITEAL BYPASS GRAFT Left 12/10/2021  ? Procedure: LEFT LOWER EXTREMITY WOUND EXPLORATION, REPAIR OF PROXIMAL ANASTIMOSIS OF FEMORAL/TIBIAL BYPASS.;  Surgeon: [name], MD;  Location: [place];  Service: Vascular;  Laterality: Left;  ? GIVENS CAPSULE STUDY N/A 01/27/2018  ? Procedure: GIVENS CAPSULE STUDY;  Surgeon: [name], MD;  [facility];  Service: Endoscopy;  Laterality: N/A;  7:00am  ? HERNIA REPAIR    ? 2010/laparoscopic/right  ? IVC FILTER INSERTION    ? 01/2013   ? KNEE ARTHROSCOPY Right   ? 2007  ? MINOR AMPUTATION OF DIGIT Left 12/06/2021  ? Procedure: PARTIAL AMPUTATION OF SECOND TOE OF LEFT FOOT;  Surgeon: [name], MD;  Location: [facility] OR;  Service: Vascular;  Laterality: Left;  ? MULTIPLE TOOTH EXTRACTIONS    ? PERIPHERAL VASCULAR BALLOON ANGIOPLASTY  02/17/2018  ? Procedure: PERIPHERAL VASCULAR BALLOON ANGIOPLASTY;  Surgeon: [name], MD;  Location: [place] CV LAB;  Service: Cardiovascular;;  ? PERIPHERAL VASCULAR BALLOON ANGIOPLASTY Left 12/30/2020  ? Procedure: PERIPHERAL VASCULAR BALLOON ANGIOPLASTY;  Surgeon: [name], MD;  Location: [place] CV LAB;  Service: Cardiovascular;  Laterality: Left;  PT  ? PERIPHERAL VASCULAR BALLOON ANGIOPLASTY Left 12/01/2021  ? Procedure: PERIPHERAL VASCULAR BALLOON ANGIOPLASTY;  Surgeon: [name], MD;  Location: [place]  CV LAB;  Service: Cardiovascular;  Laterality: Left;  ARTERIAL AND VENOUS PT  ? TOTAL HIP ARTHROPLASTY Right 05/01/2017  ? Procedure: RIGHT TOTAL HIP ARTHROPLASTY ANTERIOR APPROACH;  Surgeon: [name], MD;  [facility]: [facility];  Service: Orthopedics;  Laterality: Right;  ? TOTAL KNEE ARTHROPLASTY Left 12/13/2014  ? Procedure: LEFT TOTAL KNEE ARTHROPLASTY;  Surgeon: [name], MD;  Location: [facility];  Service: Orthopedics;  Laterality: Left;  ? TOTAL KNEE ARTHROPLASTY Right 03/21/2015  ? Procedure: RIGHT TOTAL KNEE ARTHROPLASTY;  Surgeon: [name], MD;  [facility]: [facility];  Service: Orthopedics;  Laterality: Right;  ? TRANSURETHRAL RESECTION OF PROSTATE N/A 04/20/2021  ? Procedure: TRANSURETHRAL RESECTION OF THE PROSTATE (TURP);  Surgeon: Franchot Gallo, MD;  Location: [facility];  Service: Urology;  Laterality: N/A;  90 MINS  ? ?Family History  ?Problem Relation Age of Onset  ? Hypertension Other   ? Stroke Mother   ? Stroke Father   ? Heart failure Sister   ? CAD Brother   ?     CABG age 41  ? Colon cancer Neg Hx   ? ?Social History:  reports that he quit smoking about 38 years ago. His smoking use included cigarettes. He has a 20.00 pack-year smoking history. He has never been exposed to tobacco  smoke. He has never used smokeless tobacco. He reports that he does not drink alcohol and does not use drugs. ?Allergies:  ?Allergies  ?Allergen Reactions  ? Sulfa Antibiotics   ?  Weak and dizziness  ? ?Medications Prior to Admission  ?Medication Sig Dispense Refill  ? acetaminophen (TYLENOL) 650 MG CR tablet Take 650 mg by mouth in the morning and at bedtime.    ? Bioflavonoid Products (ESTER C PO) Take 500 mg by mouth daily as needed (immune support/health).    ? docusate sodium (COLACE) 100 MG capsule Take 100 mg by mouth in the morning.    ? hydroxychloroquine (PLAQUENIL) 200 MG tablet Take 200 mg by mouth in the morning.    ? Krill Oil 500 MG CAPS Take 500 mg by mouth in the morning.    ?  lisinopril-hydrochlorothiazide (PRINZIDE,ZESTORETIC) 10-12.5 MG per tablet Take 1 tablet by mouth daily with breakfast.     ? loratadine (CLARITIN) 10 MG tablet Take 10 mg by mouth in the morning.    ? melatonin 5 MG TABS Take 10 mg by

## 2021-12-14 LAB — BPAM RBC
Blood Product Expiration Date: 202304052359
Blood Product Expiration Date: 202304092359
Blood Product Expiration Date: 202304092359
Blood Product Expiration Date: 202304092359
ISSUE DATE / TIME: 202303121508
ISSUE DATE / TIME: 202303140238
Unit Type and Rh: 5100
Unit Type and Rh: 5100
Unit Type and Rh: 5100
Unit Type and Rh: 5100

## 2021-12-14 LAB — TYPE AND SCREEN
ABO/RH(D): O POS
Antibody Screen: NEGATIVE
Unit division: 0
Unit division: 0
Unit division: 0
Unit division: 0

## 2021-12-14 LAB — PROTIME-INR
INR: 1.3 — ABNORMAL HIGH (ref 0.8–1.2)
Prothrombin Time: 16.5 seconds — ABNORMAL HIGH (ref 11.4–15.2)

## 2021-12-14 MED ORDER — WARFARIN SODIUM 5 MG PO TABS
6.0000 mg | ORAL_TABLET | Freq: Once | ORAL | Status: AC
  Administered 2021-12-14: 6 mg via ORAL
  Filled 2021-12-14: qty 1

## 2021-12-14 NOTE — Progress Notes (Signed)
Inpatient Rehabilitation Admissions Coordinator  ? ?I have received a denial from [facility] for CIR admit. I met with patient and his wife at bedside and they are aware. She is initiating expedited appeal today. I have alerted acute team and TOC. I await appeal determination. ? ?Danne Baxter, RN, MSN ?Rehab Admissions Coordinator ?[phone] ?12/14/2021 10:41 AM ? ?

## 2021-12-14 NOTE — Plan of Care (Signed)

## 2021-12-14 NOTE — Progress Notes (Signed)
Physical Therapy Treatment ?Patient Details ?Name: Jose Bush ?MRN: [id number] ?DOB: 12/06/1940 ?Today's Date: 12/14/2021 ? ? ?History of Present Illness Pt is an 81 y/o male who presented 12/06/21 for evaluation of L 1st and 2nd toe ulceration with hx of PVD. Pt underwent L popliteal artery to posterior tibial artery bypass and partial L second toe amputation 3/8 PWB on left heel.  3/12 returned to OR for bleeding. PMH: R BKA, a fib, BPH, PAD, PE, CVA, shingles, GERD, aortic stenosis. ? ?  ?PT Comments  ? ? Pt received in supine, agreeable to therapy session with encouragement, limited due to increased LLE pain/fatigue in afternoon. Pt performed bed mobility with up to modA and lateral seated scooting toward [facility] to simulate slide board transfer with maxA. Pt reports LLE too painful at this time to attempt sit<>stand transfers or gait. Pt given ice pack and LLE elevated for pain relief/edema reduction at end of session. Emphasis on supine/seated LE exercises, transfer training and positioning/pressure relief to prevent skin breakdown. Pt continues to benefit from PT services to progress toward functional mobility goals.    ?Recommendations for follow up therapy are one component of a multi-disciplinary discharge planning process, led by the attending physician.  Recommendations may be updated based on patient status, additional functional criteria and insurance authorization. ? ?Follow Up Recommendations ? Acute inpatient rehab (3hours/day) ?  ?  ?Assistance Recommended at Discharge Frequent or constant Supervision/Assistance  ?Patient can return home with the following Two people to help with walking and/or transfers;Assistance with cooking/housework;Direct supervision/assist for medications management;Direct supervision/assist for financial management;Assist for transportation;Help with stairs or ramp for entrance;A lot of help with bathing/dressing/bathroom ?  ?Equipment Recommendations ? None recommended by PT   ?  ?Recommendations for Other Services Rehab consult ? ? ?  ?Precautions / Restrictions Precautions ?Precautions: Fall ?Precaution Comments: hx of R BKA with prosthetic, increased drainage from incisions with mobility ?Required Braces or Orthoses: Other Brace ?Other Brace: L darco shoe (pt refusing darco shoe and prefers to use flat post-op shoe) ?Restrictions ?Weight Bearing Restrictions: Yes ?LLE Weight Bearing: Partial weight bearing ?Other Position/Activity Restrictions: WB through heel  ?  ? ?Mobility ? Bed Mobility ?Overal bed mobility: Needs Assistance ?Bed Mobility: Supine to Sit ?  ?  ?Supine to sit: Mod assist ?Sit to supine: Min assist ?  ?General bed mobility comments: cues for log rolling for back comfort and to prop up on R elbow to sit on R EOB (and to protect R wrist from getting caught in rail)  pt defers to prop up on elbow; heavy utilization of bed rail ?  ? ?Transfers ?Overall transfer level: Needs assistance ?  ?Transfers: Bed to chair/wheelchair/BSC ?  ?  ?  ?  ?  ? Lateral/Scoot Transfers: Max assist ?General transfer comment: cues for seated lateral scooting toward HOB; pt with poor technique and difficulty lifting hips from bed surface with BUE despite cues; good effort, pt not using LLE due to pain; transfer pad assist (~8 scoots total) ?  ? ?Ambulation/Gait ?  ?  ?  ?  ?  ?  ?  ?General Gait Details: pt defers today due to increased LLE pain/fatigue ? ? ?Stairs ?  ?  ?  ?  ?  ? ? ?Wheelchair Mobility ?  ? ?Modified Rankin (Stroke Patients Only) ?  ? ? ?  ?Balance Overall balance assessment: Needs assistance ?Sitting-balance support: Bilateral upper extremity supported, Feet unsupported ?Sitting balance-Leahy Scale: Fair ?  ?  ?  ?  ?  ?  ?  ?  ?  ?  ?  ?  ?  ?  ?  ?  ?  ? ?  ?  Cognition Arousal/Alertness: Awake/alert ?Behavior During Therapy: [facility] for tasks assessed/performed ?Overall Cognitive Status: Within Functional Limits for tasks assessed ?Area of Impairment: Safety/judgement ?  ?  ?   ?  ?  ?  ?  ?  ?  ?  ?  ?  ?Safety/Judgement: Decreased awareness of deficits ?Awareness: Emergent ?Problem Solving: Requires verbal cues ?General Comments: pt more fatigued today and defers OOB to chair transfer despite encouragement; c/o pain ?  ?  ? ?  ?Exercises Other Exercises ?Other Exercises: supine BLE AROM: ankle pumps, hip ADduction, hip aBduction, SLR, heel slides (LLE), hip extension (RLE) x10 reps ea ?Other Exercises: seated BLE AROM: hip flexion, LAQ x10 reps ea ? ?  ?General Comments General comments (skin integrity, edema, etc.): HR to 120 bpm with seated scooting; HR 90 bpm resting and SpO2 99% on RA ?  ?  ? ?Pertinent Vitals/Pain Pain Assessment ?Pain Assessment: 0-10 ?Pain Score: 7  ?Pain Location: L hip/thigh radiating to foot ?Pain Descriptors / Indicators: Discomfort, Guarding, Spasm, Sore, Shooting ?Pain Intervention(s): Limited activity within patient's tolerance, Monitored during session, Repositioned, Patient requesting pain meds-RN notified, Ice applied  ? ? ?Home Living   ?  ?  ?  ?  ?  ?  ?  ?  ?  ?   ?  ?Prior Function    ?  ?  ?   ? ?PT Goals (current goals can now be found in the care plan section) Acute Rehab PT Goals ?Patient Stated Goal: to get better, go home ?PT Goal Formulation: With patient/family ?Time For Goal Achievement: 12/21/21 ?Progress towards PT goals: Progressing toward goals ? ?  ?Frequency ? ? ? Min 3X/week ? ? ? ?  ?PT Plan Current plan remains appropriate  ? ? ?Co-evaluation   ?  ?  ?  ?  ? ?  ?AM-PAC PT "6 Clicks" Mobility   ?Outcome Measure ? Help needed turning from your back to your side while in a flat bed without using bedrails?: A Lot ?Help needed moving from lying on your back to sitting on the side of a flat bed without using bedrails?: A Lot ?Help needed moving to and from a bed to a chair (including a wheelchair)?: A Lot ?Help needed standing up from a chair using your arms (e.g., wheelchair or bedside chair)?: A Lot ?Help needed to walk in hospital  room?: Total (too fatigued today) ?Help needed climbing 3-5 steps with a railing? : Total ?6 Click Score: 10 ? ?  ?End of Session   ?Activity Tolerance: Patient limited by pain;Patient limited by fatigue ?Patient left: in bed;with call bell/phone within reach;with bed alarm set;Other (comment) (LLE elevated, heel floated) ?Nurse Communication: Mobility status;Patient requests pain meds ?PT Visit Diagnosis: Unsteadiness on feet (R26.81);Muscle weakness (generalized) (M62.81);History of falling (Z91.81);Difficulty in walking, not elsewhere classified (R26.2);Pain ?Pain - Right/Left: Left ?Pain - part of body: Ankle and joints of foot ?  ? ? ?Time: [id number] ?PT Time Calculation (min) (ACUTE ONLY): 32 min ? ?Charges:  $Therapeutic Exercise: 8-22 mins ?$Therapeutic Activity: 8-22 mins          ?          ? ?Antrice Pal P., PTA ?Acute Rehabilitation Services ?Pager: [phone] ?Office: [phone]  ? ? ?Turner Kunzman M Marlei Glomski ?12/14/2021, 6:35 PM ? ?

## 2021-12-14 NOTE — Progress Notes (Addendum)
?  Progress Note ? ? ? ?12/14/2021 ?7:09 AM ?4 Days Post-Op ? ?Subjective:  he and his wife concerned about an area on the proximal incision below the knee.   ? ?Afebrile ?HR 70's-90's  ?130's-150's systolic ?96% RA ? ?Vitals:  ? 12/14/21 0018 12/14/21 0300  ?BP: (!) 156/71 140/64  ?Pulse: 90 83  ?Resp: 17 19  ?Temp: 98.4 ?F (36.9 ?C) 98 ?F (36.7 ?C)  ?SpO2: 97% 97%  ? ? ?Physical Exam: ?Cardiac:  regular ?Lungs:  non labored ?Incisions:  all incisions are in tact ?Extremities:  +doppler signal left DP; toe ischemia stable ? ? ?CBC ?   ?Component Value Date/Time  ? WBC 9.5 12/13/2021 0229  ? RBC 2.91 (L) 12/13/2021 0229  ? HGB 8.2 (L) 12/13/2021 0229  ? HCT 25.2 (L) 12/13/2021 0229  ? PLT 237 12/13/2021 0229  ? MCV 86.6 12/13/2021 0229  ? MCH 28.2 12/13/2021 0229  ? MCHC 32.5 12/13/2021 0229  ? RDW 15.7 (H) 12/13/2021 0229  ? LYMPHSABS 1.4 04/02/2018 1533  ? MONOABS 0.8 04/02/2018 1533  ? EOSABS 0.6 04/02/2018 1533  ? BASOSABS 0.0 04/02/2018 1533  ? ? ?BMET ?   ?Component Value Date/Time  ? NA 134 (L) 12/10/2021 1431  ? K 4.6 12/10/2021 1431  ? CL 105 12/07/2021 0224  ? CO2 25 12/07/2021 0224  ? GLUCOSE 117 (H) 12/07/2021 0224  ? BUN 12 12/07/2021 0224  ? CREATININE 0.73 12/07/2021 0224  ? CALCIUM 8.6 (L) 12/07/2021 0224  ? GFRNONAA >60 12/07/2021 0224  ? GFRAA >60 08/10/2019 1536  ? ? ?INR ?   ?Component Value Date/Time  ? INR 1.3 (H) 12/14/2021 0156  ? ? ? ?Intake/Output Summary (Last 24 hours) at 12/14/2021 0709 ?Last data filed at 12/14/2021 0555 ?Gross per 24 hour  ?Intake 480 ml  ?Output 1700 ml  ?Net -1220 ml  ? ? ? ?Assessment/Plan:  81 y.o. male is s/p:  ?1) left greater saphenous vein harvest ?2) creation of left posterior tibial artery and vein arteriovenous fistula ?3) left below knee popliteal artery to left posterior tibial artery vein fistula bypass ?4) left second toe partial amputation  ?8 Days Post-Op ?and ?Repair of left popliteal to posterior tibial artery bypass graft proximal anastomosis  ?4 Days  Post-Op ? ? ?-pt continues to have + doppler signal left DP ?-CIR pending-hopeful for insurance approval.  ?-DVT prophylaxis:  coumadin - INR today is 1.3-slowly increasing, which is ideal for this pt. ? ? ?Doreatha Massed, PA-C ?[facility] ?[phone] ?12/14/2021 ?7:09 AM ? ?VASCULAR STAFF ADDENDUM: ?I have independently interviewed and examined the patient. ?I agree with the above.  ? ?Rande Brunt. Lenell Antu, MD ?[facility] [place] ?Office Phone Number: [phone] ?12/14/2021 4:13 PM ? ? ? ?

## 2021-12-14 NOTE — Progress Notes (Addendum)
ANTICOAGULATION CONSULT NOTE  ? ?Pharmacy Consult for Warfarin ?Indication: DVT and stroke ? ?Allergies  ?Allergen Reactions  ? Sulfa Antibiotics   ?  Weak and dizziness  ? ? ?Patient Measurements: ?Height: 5\' 8"  (172.7 cm) ?Weight: 91.9 kg (202 lb 9.6 oz) ?IBW/kg (Calculated) : 68.4 ? ?Vital Signs: ?Temp: 97.8 ?F (36.6 ?C) (03/16 0720) ?Temp Source: Oral (03/16 0720) ?BP: 139/65 (03/16 0825) ?Pulse Rate: 79 (03/16 0720) ? ?Labs: ?Recent Labs  ?  12/12/21 ?0128 12/12/21 ?[id number] 12/13/21 ?0229 12/14/21 ?0156  ?HGB 6.8* 8.7* 8.2*  --   ?HCT 21.0* 27.0* 25.2*  --   ?PLT 222  --  237  --   ?LABPROT 14.6  --  14.8 16.5*  ?INR 1.1  --  1.2 1.3*  ? ? ? ?Estimated Creatinine Clearance: 79.7 mL/min (by C-G formula based on SCr of 0.73 mg/dL). ? ? ?Assessment: ?81 yo male who is now s/p tibial artery vein fistula bypass w/ repair 3/12. Pt was on warfarin PTA for a hx PAD/CVA/DVT. Pharmacy consulted to resume warfarin with no bridge. ? ?Prior to admission warfarin dose was 4mg  daily.  ? ?INR low at 1.3 after restarting warfarin 3/13. No s/sx bleeding reported. We have been titrating warfarin up slowly hoping for gradual increase in INR noted per surgery. Noted Hgb back down to 8.2 yesterday after transfusion, no cbc today.  ? ?Goal of Therapy:  ?INR 2-3 ?Monitor platelets by anticoagulation protocol: Yes ?  ?Plan:  ?Give warfarin 6 mg again today ?Ceck INR daily while on warfarin ?Continue to monitor H&H and platelets ? ? ? ?Thank you for allowing pharmacy to be a part of this patient?s care. ? ?Erin Hearing PharmD., BCPS ?Clinical Pharmacist ?12/14/2021 9:43 AM ? ?

## 2021-12-15 LAB — PROTIME-INR
INR: 1.5 — ABNORMAL HIGH (ref 0.8–1.2)
Prothrombin Time: 18.3 seconds — ABNORMAL HIGH (ref 11.4–15.2)

## 2021-12-15 MED ORDER — WARFARIN SODIUM 5 MG PO TABS
6.0000 mg | ORAL_TABLET | Freq: Once | ORAL | Status: AC
  Administered 2021-12-15: 6 mg via ORAL
  Filled 2021-12-15: qty 1

## 2021-12-15 NOTE — Care Management Important Message (Signed)
Important Message ? ?Patient Details  ?Name: Jose Bush ?MRN: [id number] ?Date of Birth: 12-15-1940 ? ? ?Medicare Important Message Given:  Yes ? ? ? ? ?[name] ?12/15/2021, 9:16 AM ?

## 2021-12-15 NOTE — Progress Notes (Signed)
ANTICOAGULATION CONSULT NOTE  ? ?Pharmacy Consult for Warfarin ?Indication: DVT and stroke ? ?Allergies  ?Allergen Reactions  ? Sulfa Antibiotics   ?  Weak and dizziness  ? ? ?Patient Measurements: ?Height: 5\' 8"  (172.7 cm) ?Weight: 91.9 kg (202 lb 9.6 oz) ?IBW/kg (Calculated) : 68.4 ? ?Vital Signs: ?Temp: 98.2 ?F (36.8 ?C) (03/17 0805) ?Temp Source: Oral (03/17 0805) ?BP: 150/67 (03/17 0805) ?Pulse Rate: 93 (03/17 0805) ? ?Labs: ?Recent Labs  ?  12/13/21 ?0229 12/14/21 ?0156 12/15/21 ?0153  ?HGB 8.2*  --   --   ?HCT 25.2*  --   --   ?PLT 237  --   --   ?LABPROT 14.8 16.5* 18.3*  ?INR 1.2 1.3* 1.5*  ? ? ? ?Estimated Creatinine Clearance: 79.7 mL/min (by C-G formula based on SCr of 0.73 mg/dL). ? ? ?Assessment: ?81 yo male who is now s/p tibial artery vein fistula bypass w/ repair 3/12. Pt was on warfarin PTA for a hx PAD/CVA/DVT. Pharmacy consulted to resume warfarin with no bridge. ? ?Prior to admission warfarin dose was 4mg  daily.  ? ?INR low at 1.5 but trending up after restarting warfarin 3/13. No s/sx bleeding reported. We have been titrating warfarin up slowly hoping for gradual increase in INR noted per surgery. No bleeding issues noted.  ? ?Goal of Therapy:  ?INR 2-3 ?Monitor platelets by anticoagulation protocol: Yes ?  ?Plan:  ?Give warfarin 6 mg again today ?Ceck INR daily while on warfarin ?Continue to monitor H&H and platelets ? ?Thank you for allowing pharmacy to be a part of this patient?s care. ? ?Erin Hearing PharmD., BCPS ?Clinical Pharmacist ?12/15/2021 11:01 AM ? ?

## 2021-12-15 NOTE — Progress Notes (Signed)
Occupational Therapy Treatment ?Patient Details ?Name: Jose Bush ?MRN: [id number] ?DOB: 1941/01/27 ?Today's Date: 12/15/2021 ? ? ?History of present illness Pt is an 81 y/o male who presented 12/06/21 for evaluation of L 1st and 2nd toe ulceration with hx of PVD. Pt underwent L popliteal artery to posterior tibial artery bypass and partial L second toe amputation 3/8 PWB on left heel.  3/12 returned to OR for bleeding. PMH: R BKA, a fib, BPH, PAD, PE, CVA, shingles, GERD, aortic stenosis. ?  ?OT comments ? Pt progressed from supine to chair this session with don of prosthetic to eat lunch in upright posture. Pt encouraged by upright to eat. Pt hoping for CIR to get more therapy to d/c home at mOD I. Pt requires total +2 max (A) from bed surface with strong posterior bias. Recommendation CIR.  ? ?Recommendations for follow up therapy are one component of a multi-disciplinary discharge planning process, led by the attending physician.  Recommendations may be updated based on patient status, additional functional criteria and insurance authorization. ?   ?Follow Up Recommendations ? Acute inpatient rehab (3hours/day)  ?  ?Assistance Recommended at Discharge Frequent or constant Supervision/Assistance  ?Patient can return home with the following ? Two people to help with walking and/or transfers;Two people to help with bathing/dressing/bathroom ?  ?Equipment Recommendations ? Other (comment)  ?  ?Recommendations for Other Services Rehab consult ? ?  ?Precautions / Restrictions Precautions ?Precautions: Fall ?Precaution Comments: hx of R BKA with prosthetic, increased drainage from incisions with mobility ?Required Braces or Orthoses: Other Brace ?Other Brace: L darco shoe (pt refusing darco shoe and prefers to use flat post-op shoe) ?Restrictions ?Weight Bearing Restrictions: Yes ?LLE Weight Bearing: Partial weight bearing ?Other Position/Activity Restrictions: WB through heel  ? ? ?  ? ?Mobility Bed Mobility ?Overal  bed mobility: Needs Assistance ?Bed Mobility: Supine to Sit ?  ?  ?Supine to sit: Min assist ?  ?  ?General bed mobility comments: pt with HOB elevated and able to elevate trunk from surface ?  ? ?Transfers ?Overall transfer level: Needs assistance ?Equipment used: Rollator (4 wheels) ?Transfers: Sit to/from Stand ?Sit to Stand: +2 physical assistance, Max assist ?  ?  ?  ?  ?  ?General transfer comment: pt with posterior bias and requires (A) to anterior weight shift onto rollator. pt with R LE abducted and needs increase time to position and narrow base of support ?  ?  ?Balance Overall balance assessment: Needs assistance ?Sitting-balance support: Bilateral upper extremity supported, Feet supported ?Sitting balance-Leahy Scale: Fair ?  ?  ?Standing balance support: Bilateral upper extremity supported, During functional activity, Reliant on assistive device for balance ?Standing balance-Leahy Scale: Poor ?  ?  ?  ?  ?  ?  ?  ?  ?  ?  ?  ?  ?   ? ?ADL either performed or assessed with clinical judgement  ? ?ADL Overall ADL's : Needs assistance/impaired ?Eating/Feeding: Set up;Sitting ?Eating/Feeding Details (indicate cue type and reason): willing to get in chair to eat ?  ?  ?  ?  ?  ?  ?  ?  ?Lower Body Dressing: +2 for physical assistance;Maximal assistance ?Lower Body Dressing Details (indicate cue type and reason): pt was able to place L LE into shoe but requires (A) from EOB to don. Pt able to place sleeve on R LE but requires total +2 max to fully lock in the prosthetic with posterior lean ?  ?  ?  ?  ?  ?  ?  ?  ?  ? ?  Extremity/Trunk Assessment   ?  ?  ?  ?  ?  ? ?Vision   ?  ?  ?Perception   ?  ?Praxis   ?  ? ?Cognition Arousal/Alertness: Awake/alert ?Behavior During Therapy: [facility] for tasks assessed/performed ?Overall Cognitive Status: Impaired/Different from baseline ?Area of Impairment: Safety/judgement ?  ?  ?  ?  ?  ?  ?  ?  ?  ?  ?  ?  ?Safety/Judgement: Decreased awareness of deficits ?  ?  ?General  Comments: pt continues to insiste on locked rollator and post op shoe. pt does not sustain good PWB at the heel but states "i can't do that other one" ?  ?  ?   ?Exercises   ? ?  ?Shoulder Instructions   ? ? ?  ?General Comments VSS  ? ? ?Pertinent Vitals/ Pain       Pain Assessment ?Pain Assessment: 0-10 ?Faces Pain Scale: Hurts little more ?Pain Location: L foot ?Pain Descriptors / Indicators: Discomfort, Guarding, Spasm, Sore, Shooting ?Pain Intervention(s): Monitored during session, Repositioned ? ?Home Living   ?  ?  ?  ?  ?  ?  ?  ?  ?  ?  ?  ?  ?  ?  ?  ?  ?  ?  ? ?  ?Prior Functioning/Environment    ?  ?  ?  ?   ? ?Frequency ? Min 2X/week  ? ? ? ? ?  ?Progress Toward Goals ? ?OT Goals(current goals can now be found in the care plan section) ? Progress towards OT goals: Progressing toward goals ? ?Acute Rehab OT Goals ?Patient Stated Goal: to get INR 2 ?OT Goal Formulation: With patient/family ?Time For Goal Achievement: 12/21/21 ?Potential to Achieve Goals: Good ?ADL Goals ?Pt Will Perform Lower Body Bathing: with min assist;sitting/lateral leans;sit to/from stand ?Pt Will Transfer to Toilet: with min assist;ambulating ?Pt Will Perform Toileting - Clothing Manipulation and hygiene: with min assist;sitting/lateral leans;sit to/from stand  ?Plan Discharge plan remains appropriate   ? ?Co-evaluation ? ? ?   ?  ?  ?  ?  ? ?  ?AM-PAC OT "6 Clicks" Daily Activity     ?Outcome Measure ? ? Help from another person eating meals?: A Little ?Help from another person taking care of personal grooming?: A Little ?Help from another person toileting, which includes using toliet, bedpan, or urinal?: A Little ?Help from another person bathing (including washing, rinsing, drying)?: A Lot ?Help from another person to put on and taking off regular upper body clothing?: A Little ?Help from another person to put on and taking off regular lower body clothing?: A Lot ?6 Click Score: 16 ? ?  ?End of Session Equipment Utilized During  Treatment: Gait belt;Rollator (4 wheels) ? ?OT Visit Diagnosis: Unsteadiness on feet (R26.81);Other abnormalities of gait and mobility (R26.89);Muscle weakness (generalized) (M62.81) ?  ?Activity Tolerance Patient tolerated treatment well ?  ?Patient Left in chair;with call bell/phone within reach ?  ?Nurse Communication Mobility status;Precautions;Weight bearing status ?  ? ?   ? ?Time: [id number] ?OT Time Calculation (min): 17 min ? ?Charges: OT General Charges ?$OT Visit: 1 Visit ?OT Treatments ?$Self Care/Home Management : 8-22 mins ? ? ?Brynn, OTR/L  ?Acute Rehabilitation Services ?Pager: [phone] ?Office: [phone] ?. ? ? ?Jeri Modena ?12/15/2021, 4:12 PM ? ? ?

## 2021-12-15 NOTE — Progress Notes (Signed)
Inpatient Rehabilitation Admissions Coordinator  ? ?We await appeal determination with [employer] for possible Cir admit. ? ?Ottie Glazier, RN, MSN ?Rehab Admissions Coordinator ?[phone] ?12/15/2021 12:58 PM ? ?

## 2021-12-15 NOTE — Plan of Care (Signed)
  Problem: Education: Goal: Knowledge of General Education information will improve Description Including pain rating scale, medication(s)/side effects and non-pharmacologic comfort measures Outcome: Progressing   

## 2021-12-15 NOTE — Discharge Instructions (Addendum)
? ?[facility] ? ?Discharge instructions ? ?Lower Extremity Bypass Surgery ? ?Please refer to the following instruction for your post-procedure care. Your surgeon or physician assistant will discuss any changes with you. ? ?Activity ? ?You are encouraged to walk as much as you can. You can slowly return to normal activities during the month after your surgery. Avoid strenuous activity and heavy lifting until your doctor tells you it's OK. Avoid activities such as vacuuming or swinging a golf club. Do not drive until your doctor give the OK and you are no longer taking prescription pain medications. It is also normal to have difficulty with sleep habits, eating and bowel movement after surgery. These will go away with time. ? ?Bathing/Showering ? ?Shower daily after you go home. Do not soak in a bathtub, hot tub, or swim until the incision heals completely. ? ?Incision Care ? ?Clean your incision with mild soap and water. Shower every day. Pat the area dry with a clean towel. You do not need a bandage unless otherwise instructed. Do not apply any ointments or creams to your incision. If you have open wounds you will be instructed how to care for them or a visiting nurse may be arranged for you. If you have staples or sutures along your incision they will be removed at your post-op appointment. You may have skin glue on your incision. Do not peel it off. It will come off on its own in about one week. ? ?Wash the groin wound with soap and water daily and pat dry. (No tub bath-only shower)  Then put a dry gauze or washcloth in the groin to keep this area dry to help prevent wound infection.  Do this daily and as needed.  Do not use Vaseline or neosporin on your incisions.  Only use soap and water on your incisions and then protect and keep dry. ? ?Diet ? ?Resume your normal diet. There are no special food restrictions following this procedure. A low fat/ low cholesterol diet is  recommended for all patients with vascular disease. In order to heal from your surgery, it is CRITICAL to get adequate nutrition. Your body requires vitamins, minerals, and protein. Vegetables are the best source of vitamins and minerals. Vegetables also provide the perfect balance of protein. Processed food has little nutritional value, so try to avoid this. ? ?Medications ? ?Resume taking all your medications unless your doctor or physician assistant tells you not to. If your incision is causing pain, you may take over-the-counter pain relievers such as acetaminophen (Tylenol). If you were prescribed a stronger pain medication, please aware these medication can cause nausea and constipation. Prevent nausea by taking the medication with a snack or meal. Avoid constipation by drinking plenty of fluids and eating foods with high amount of fiber, such as fruits, vegetables, and grains. Take Colace 100 mg (an over-the-counter stool softener) twice a day as needed for constipation.  ?Do not take Tylenol if you are taking prescription pain medications. ? ?Follow Up ? ?Our office will schedule a follow up appointment 2-3 weeks following discharge. ? ?Please call us immediately for any of the following conditions ? ?Severe or worsening pain in your legs or feet while at rest or while walking Increase pain, redness, warmth, or drainage (pus) from your incision site(s) ?Fever of 101 degree or higher ?The swelling in your leg with the bypass suddenly worsens and becomes more painful than when you were in the hospital ?If you have  been instructed to feel your graft pulse then you should do so every day. If you can no longer feel this pulse, call the office immediately. Not all patients are given this instruction. ? ?Leg swelling is common after leg bypass surgery. ? ?The swelling should improve over a few months following surgery. To improve the swelling, you may elevate your legs above the level of your heart while you are  sitting or resting. Your surgeon or physician assistant may ask you to apply an ACE wrap or wear compression (TED) stockings to help to reduce swelling. ? ?Reduce your risk of vascular disease ? ?Stop smoking. If you would like help call [facility] at 1-800-QUIT-NOW 4030788635) or [facility] at [phone]. ? ?Manage your cholesterol ?Maintain a desired weight ?Control your diabetes weight ?Control your diabetes ?Keep your blood pressure down ? ?If you have any questions, please call the office at [phone] ? ?Information on my medicine - Coumadin?   (Warfarin) ? ?This medication education was reviewed with me or my healthcare representative as part of my discharge preparation.   ? ?Why was Coumadin prescribed for you? ?Coumadin was prescribed for you because you have a blood clot or a medical condition that can cause an increased risk of forming blood clots. Blood clots can cause serious health problems by blocking the flow of blood to the heart, lung, or brain. Coumadin can prevent harmful blood clots from forming. ?As a reminder your indication for Coumadin is:  Deep Venous Thrombosis treatment, stroke prevention with afib ? ?What test will check on my response to Coumadin? ?While on Coumadin (warfarin) you will need to have an INR test regularly to ensure that your dose is keeping you in the desired range. The INR (international normalized ratio) number is calculated from the result of the laboratory test called prothrombin time (PT). ? ?If an INR APPOINTMENT HAS NOT ALREADY BEEN MADE FOR YOU please schedule an appointment to have this lab work done by your health care provider within 7 days. ?Your INR goal is usually a number between:  2 to 3 or your provider may give you a more narrow range like 2-2.5.  Ask your health care provider during an office visit what your goal INR is. ? ?What  do you need to  know  About  COUMADIN? ?Take Coumadin (warfarin) exactly as prescribed by your healthcare provider  about the same time each day.  DO NOT stop taking without talking to the doctor who prescribed the medication.  Stopping without other blood clot prevention medication to take the place of Coumadin may increase your risk of developing a new clot or stroke.  Get refills before you run out. ? ?What do you do if you miss a dose? ?If you miss a dose, take it as soon as you remember on the same day then continue your regularly scheduled regimen the next day.  Do not take two doses of Coumadin at the same time. ? ?Important Safety Information ?A possible side effect of Coumadin (Warfarin) is an increased risk of bleeding. You should call your healthcare provider right away if you experience any of the following: ?Bleeding from an injury or your nose that does not stop. ?Unusual colored urine (red or dark brown) or unusual colored stools (red or black). ?Unusual bruising for unknown reasons. ?A serious fall or if you hit your head (even if there is no bleeding). ? ?Some foods or medicines interact with Coumadin? (warfarin) and might alter your response to warfarin.  To help avoid this: ?Eat a balanced diet, maintaining a consistent amount of Vitamin K. ?Notify your provider about major diet changes you plan to make. ?Avoid alcohol or limit your intake to 1 drink for women and 2 drinks for men per day. ?(1 drink is 5 oz. wine, 12 oz. beer, or 1.5 oz. liquor.) ? ?Make sure that ANY health care provider who prescribes medication for you knows that you are taking Coumadin (warfarin).  Also make sure the healthcare provider who is monitoring your Coumadin knows when you have started a new medication including herbals and non-prescription products. ? ?Coumadin? (Warfarin)  Major Drug Interactions  ?Increased Warfarin Effect Decreased Warfarin Effect  ?Alcohol (large quantities) ?Antibiotics (esp. Septra/Bactrim, Flagyl, Cipro) ?Amiodarone (Cordarone) ?Aspirin (ASA) ?Cimetidine (Tagamet) ?Megestrol (Megace) ?NSAIDs (ibuprofen,  naproxen, etc.) ?Piroxicam (Feldene) ?Propafenone (Rythmol SR) ?Propranolol (Inderal) ?Isoniazid (INH) ?Posaconazole (Noxafil) Barbiturates (Phenobarbital) ?Carbamazepine (Tegretol) ?Chlordiazepoxide (Librium) ?Cho

## 2021-12-15 NOTE — Progress Notes (Signed)
Mobility Specialist Progress Note ? ? 12/15/21 1751  ?Mobility  ?Activity Transferred from chair to bed  ?Level of Assistance Moderate assist, patient does 50-74%  ?Assistive Device Four wheel walker  ?LLE Weight Bearing PWB  ?Distance Ambulated (ft) 2 ft  ?Activity Response Tolerated well  ?$Mobility charge 1 Mobility  ? ?Received pt in chair requesting to get back to bed, having no complaints prior to tx. NT assisting in giving pt a bath during the transitions of the transfers. Pt requiring mod A on incline and min G while standing. Pt quickly fatigued when trying to pivot turn to EOB and expressing that "I am unable to move my R leg" required mod cues on positioning and safety to bring bed to pt. Left in bed w/ NT in the room.  ? ?Frederico Hamman ?Mobility Specialist ?Phone Number [phone] ? ?

## 2021-12-15 NOTE — Progress Notes (Addendum)
?  Progress Note ? ? ? ?12/15/2021 ?8:54 AM ?5 Days Post-Op ? ?Subjective:  no major complaints. Still some oozing from lower leg incision and ankle incision that wife is concerned about ? ? ?Vitals:  ? 12/15/21 0100 12/15/21 0805  ?BP: (!) 125/54 (!) 150/67  ?Pulse: 81 93  ?Resp: 16 18  ?Temp: 98.3 ?F (36.8 ?C) 98.2 ?F (36.8 ?C)  ?SpO2: 98% 99%  ? ?Physical Exam: ?Cardiac:  regular ?Lungs:  non labored ?Incisions: Incisions are intact. Mild serosanguinous drainage from below knee incision and ankle incisions ?Extremities:  Doppler signal to left DP; Toe ischemia stable ?Neurologic: alert and oriented ? ?CBC ?   ?Component Value Date/Time  ? WBC 9.5 12/13/2021 0229  ? RBC 2.91 (L) 12/13/2021 0229  ? HGB 8.2 (L) 12/13/2021 0229  ? HCT 25.2 (L) 12/13/2021 0229  ? PLT 237 12/13/2021 0229  ? MCV 86.6 12/13/2021 0229  ? MCH 28.2 12/13/2021 0229  ? MCHC 32.5 12/13/2021 0229  ? RDW 15.7 (H) 12/13/2021 0229  ? LYMPHSABS 1.4 04/02/2018 1533  ? MONOABS 0.8 04/02/2018 1533  ? EOSABS 0.6 04/02/2018 1533  ? BASOSABS 0.0 04/02/2018 1533  ? ? ?BMET ?   ?Component Value Date/Time  ? NA 134 (L) 12/10/2021 1431  ? K 4.6 12/10/2021 1431  ? CL 105 12/07/2021 0224  ? CO2 25 12/07/2021 0224  ? GLUCOSE 117 (H) 12/07/2021 0224  ? BUN 12 12/07/2021 0224  ? CREATININE 0.73 12/07/2021 0224  ? CALCIUM 8.6 (L) 12/07/2021 0224  ? GFRNONAA >60 12/07/2021 0224  ? GFRAA >60 08/10/2019 1536  ? ? ?INR ?   ?Component Value Date/Time  ? INR 1.5 (H) 12/15/2021 0153  ? ? ? ?Intake/Output Summary (Last 24 hours) at 12/15/2021 0854 ?Last data filed at 12/15/2021 0600 ?Gross per 24 hour  ?Intake 240 ml  ?Output 950 ml  ?Net -710 ml  ? ? ? ?Assessment/Plan:  81 y.o. male is s/p 1) left greater saphenous vein harvest ?2) creation of left posterior tibial artery and vein arteriovenous fistula ?3) left below knee popliteal artery to left posterior tibial artery vein fistula bypass ?4) left second toe partial amputation  ?9 Days Post-Op ?And Repair of left popliteal  to posterior tibial artery bypass graft proximal anastomosis  ?  5 Days Post-Op  ? ?LLE remains well perfused and warm. Doppler Dp signal ?Ischemic toe stable ?Elevate LLE while in bed ?Continue to mobilize as tolerated ?Pending auth for CIR ? ?DVT prophylaxis:  Coumadin- INR 1.5 today, slowly increasing ? ? ?Karoline Caldwell, PA-C ?[facility] ?[phone] ?12/15/2021 ?8:54 AM ? ?VASCULAR STAFF ADDENDUM: ?I have independently interviewed and examined the patient. ?I agree with the above.  ? ?Yevonne Aline. Stanford Breed, MD ?[facility] ?Office Phone Number: [phone] ?12/15/2021 5:49 PM ? ? ?

## 2021-12-15 NOTE — Progress Notes (Signed)
Physical Therapy Treatment ?Patient Details ?Name: Jose Bush ?MRN: [id number] ?DOB: May 08, 1941 ?Today's Date: 12/15/2021 ? ? ?History of Present Illness Pt is an 81 y/o male who presented 12/06/21 for evaluation of L 1st and 2nd toe ulceration with hx of PVD. Pt underwent L popliteal artery to posterior tibial artery bypass and partial L second toe amputation 3/8 PWB on left heel.  3/12 returned to OR for bleeding. PMH: R BKA, a fib, BPH, PAD, PE, CVA, shingles, GERD, aortic stenosis. ? ?  ?PT Comments  ? ? Pt received in recliner, agreeable to therapy session and with good participation and tolerance for transfer and gait training short distances in room with chair follow for safety. Pt needing up to modA (+2 for safety) to perform transfers and gait in room. Pt VSS on RA, he reports minimal fatigue at end of session (3-4/10 RPE) and also recently worked with OT. Pt continues to benefit from PT services to progress toward functional mobility goals.    ?Recommendations for follow up therapy are one component of a multi-disciplinary discharge planning process, led by the attending physician.  Recommendations may be updated based on patient status, additional functional criteria and insurance authorization. ? ?Follow Up Recommendations ? Acute inpatient rehab (3hours/day) ?  ?  ?Assistance Recommended at Discharge Frequent or constant Supervision/Assistance  ?Patient can return home with the following Two people to help with walking and/or transfers;Assistance with cooking/housework;Direct supervision/assist for medications management;Direct supervision/assist for financial management;Assist for transportation;Help with stairs or ramp for entrance;A lot of help with bathing/dressing/bathroom ?  ?Equipment Recommendations ? None recommended by PT  ?  ?Recommendations for Other Services Rehab consult ? ? ?  ?Precautions / Restrictions Precautions ?Precautions: Fall ?Precaution Comments: hx of R BKA with prosthetic,  increased drainage from incisions with mobility ?Required Braces or Orthoses: Other Brace ?Other Brace: L darco shoe (pt refusing darco shoe and prefers to use flat post-op shoe) ?Restrictions ?Weight Bearing Restrictions: Yes ?LLE Weight Bearing: Partial weight bearing ?Other Position/Activity Restrictions: WB through heel  ?  ? ?Mobility ? Bed Mobility ?  ?  ?  ?  ?  ?  ?  ?  ?  ? ?Transfers ?Overall transfer level: Needs assistance ?Equipment used: Rollator (4 wheels) ?Transfers: Sit to/from Stand ?Sit to Stand: +2 physical assistance, Mod assist, From elevated surface ?  ?  ?  ?  ?  ?General transfer comment: pt with posterior bias and requires (A) to anterior weight shift onto rollator. pt with R LE abducted and needs increase time to position and narrow base of support ?  ? ?Ambulation/Gait ?Ambulation/Gait assistance: Mod assist, +2 safety/equipment ?Gait Distance (Feet): 15 Feet (x2 with seated break between trials (52ft total)) ?Assistive device: Rollator (4 wheels) ?Gait Pattern/deviations: Step-to pattern, Trunk flexed, Wide base of support, Shuffle, Decreased dorsiflexion - left ?  ?  ?  ?General Gait Details: heavy reliance on BUE support of locked rollator, therapist assisting at times for manual navigation with rollator and to prevent rollator from tipping inward due to force exerted upon handles by pt. Very slow pace, seated break halfway due to pt c/o fatigue. small step-to pattern. HR 99-111 bpm with exertion (84 bpm resting); SpO2 99% on RA; close chair follow ? ? ?Stairs ?  ?  ?  ?  ?  ? ? ?Wheelchair Mobility ?  ? ?Modified Rankin (Stroke Patients Only) ?  ? ? ?  ?Balance Overall balance assessment: Needs assistance ?Sitting-balance support: Bilateral upper extremity supported, Feet  supported ?Sitting balance-Leahy Scale: Fair ?  ?  ?Standing balance support: Bilateral upper extremity supported, During functional activity, Reliant on assistive device for balance ?Standing balance-Leahy Scale:  Poor ?Standing balance comment: modA for stability with rollator support, posterior lean ?  ?  ?  ?  ?  ?  ?  ?  ?  ?  ?  ?  ? ?  ?Cognition Arousal/Alertness: Awake/alert ?Behavior During Therapy: [facility] for tasks assessed/performed ?Overall Cognitive Status: Impaired/Different from baseline ?Area of Impairment: Safety/judgement ?  ?  ?  ?  ?  ?  ?  ?  ?  ?  ?  ?  ?Safety/Judgement: Decreased awareness of deficits ?  ?  ?General Comments: pt continues to insist on locked rollator and post op shoe. pt does not sustain good PWB at the heel but states "i can't do that other one" ?  ?  ? ?  ?Exercises   ? ?  ?General Comments General comments (skin integrity, edema, etc.): HR to 111 bpm with exertion ?  ?  ? ?Pertinent Vitals/Pain Pain Assessment ?Pain Assessment: 0-10 ?Faces Pain Scale: Hurts even more ?Pain Location: L foot ?Pain Descriptors / Indicators: Discomfort, Guarding, Spasm, Sore, Shooting ?Pain Intervention(s): Monitored during session, Repositioned  ? ? ? ?PT Goals (current goals can now be found in the care plan section) Acute Rehab PT Goals ?Patient Stated Goal: to go to rehab then home ?PT Goal Formulation: With patient/family ?Time For Goal Achievement: 12/21/21 ?Progress towards PT goals: Progressing toward goals ? ?  ?Frequency ? ? ? Min 3X/week ? ? ? ?  ?PT Plan Current plan remains appropriate  ? ? ?   ?AM-PAC PT "6 Clicks" Mobility   ?Outcome Measure ? Help needed turning from your back to your side while in a flat bed without using bedrails?: A Lot ?Help needed moving from lying on your back to sitting on the side of a flat bed without using bedrails?: A Lot ?Help needed moving to and from a bed to a chair (including a wheelchair)?: A Lot ?Help needed standing up from a chair using your arms (e.g., wheelchair or bedside chair)?: A Lot ?Help needed to walk in hospital room?: A Lot ?Help needed climbing 3-5 steps with a railing? : Total ?6 Click Score: 11 ? ?  ?End of Session Equipment Utilized During  Treatment: Gait belt ?Activity Tolerance: Patient tolerated treatment well ?Patient left: in chair;with call bell/phone within reach;with chair alarm set ?Nurse Communication: Mobility status ?PT Visit Diagnosis: Unsteadiness on feet (R26.81);Muscle weakness (generalized) (M62.81);History of falling (Z91.81);Difficulty in walking, not elsewhere classified (R26.2);Pain ?Pain - Right/Left: Left ?Pain - part of body: Ankle and joints of foot ?  ? ? ?Time: [id number] ?PT Time Calculation (min) (ACUTE ONLY): 33 min ? ?Charges:  $Gait Training: 8-22 mins ?$Therapeutic Activity: 8-22 mins          ?          ? ?Montavius Subramaniam P., PTA ?Acute Rehabilitation Services ?Pager: [phone] ?Office: [phone]  ? ? ?Tyjae Shvartsman M Laurice Kimmons ?12/15/2021, 5:46 PM ? ?

## 2021-12-16 LAB — CBC
HCT: 25.9 % — ABNORMAL LOW (ref 39.0–52.0)
Hemoglobin: 8.1 g/dL — ABNORMAL LOW (ref 13.0–17.0)
MCH: 27.4 pg (ref 26.0–34.0)
MCHC: 31.3 g/dL (ref 30.0–36.0)
MCV: 87.5 fL (ref 80.0–100.0)
Platelets: 248 10*3/uL (ref 150–400)
RBC: 2.96 MIL/uL — ABNORMAL LOW (ref 4.22–5.81)
RDW: 15.3 % (ref 11.5–15.5)
WBC: 10.3 10*3/uL (ref 4.0–10.5)
nRBC: 0 % (ref 0.0–0.2)

## 2021-12-16 LAB — PROTIME-INR
INR: 1.7 — ABNORMAL HIGH (ref 0.8–1.2)
Prothrombin Time: 20.2 seconds — ABNORMAL HIGH (ref 11.4–15.2)

## 2021-12-16 MED ORDER — WARFARIN SODIUM 5 MG PO TABS
5.0000 mg | ORAL_TABLET | Freq: Once | ORAL | Status: AC
  Administered 2021-12-16: 5 mg via ORAL
  Filled 2021-12-16: qty 1

## 2021-12-16 NOTE — Progress Notes (Signed)
ANTICOAGULATION CONSULT NOTE  ? ?Pharmacy Consult for Warfarin ?Indication: DVT and stroke ? ?Allergies  ?Allergen Reactions  ? Sulfa Antibiotics   ?  Weak and dizziness  ? ? ?Patient Measurements: ?Height: 5\' 8"  (172.7 cm) ?Weight: 91.9 kg (202 lb 9.6 oz) ?IBW/kg (Calculated) : 68.4 ? ?Vital Signs: ?Temp: 97.9 ?F (36.6 ?C) (03/18 [id number]) ?Temp Source: Oral (03/18 [id number]) ?BP: 131/66 (03/18 0817) ?Pulse Rate: 97 (03/18 0817) ? ?Labs: ?Recent Labs  ?  12/14/21 ?0156 12/15/21 ?0153 12/16/21 ?0244  ?HGB  --   --  8.1*  ?HCT  --   --  25.9*  ?PLT  --   --  248  ?LABPROT 16.5* 18.3* 20.2*  ?INR 1.3* 1.5* 1.7*  ? ? ? ?Estimated Creatinine Clearance: 79.7 mL/min (by C-G formula based on SCr of 0.73 mg/dL). ? ? ?Assessment: ?81 yo male who is now s/p tibial artery vein fistula bypass w/ repair 3/12. Pt was on warfarin PTA for a hx PAD/CVA/DVT. Pharmacy consulted to resume warfarin with no bridge. ? ?Prior to admission warfarin dose was 4mg  daily.  ? ?INR low at 1.7 but trending up after restarting warfarin 3/13. No s/sx bleeding reported. We have been titrating warfarin up slowly hoping for gradual increase in INR noted per surgery. No bleeding issues noted.  ?Goal of Therapy:  ?INR 2-3 ?Monitor platelets by anticoagulation protocol: Yes ?  ?Plan:  ?Give warfarin 5 mg today ?Ceck INR daily while on warfarin ?Continue to monitor H&H and platelets ? ?Thank you for allowing pharmacy to participate in this patient's care. ? ?Reatha Harps, PharmD ?PGY1 Pharmacy Resident ?12/16/2021 8:37 AM ?Check [email or web address] for unit specific pharmacy number ? ? ?

## 2021-12-16 NOTE — Progress Notes (Signed)
Mobility Specialist Progress Note ? ? 12/16/21 1450  ?Mobility  ?Activity Dangled on edge of bed;Moved into chair position in bed  ?Level of Assistance Minimal assist, patient does 75% or more  ?Assistive Device  ?(HHA)  ?LLE Weight Bearing PWB  ?Activity Response Tolerated well  ?$Mobility charge 1 Mobility  ? ?Pt declining mobility today w/ max encouragement but agreeable to sit EOB for a couple minutes. Min A to get trunk upright then sat up ~5mins w/o complaint. Once supine, placed in chair position w/ call bell in reach and needs met.  ? ?  ?Mobility Specialist ?Phone Number [phone] ? ?

## 2021-12-16 NOTE — Progress Notes (Addendum)
?  Progress Note ? ? ? ?12/16/2021 ?8:17 AM ?6 Days Post-Op ? ?Subjective:  sitting up on side of bed eating breakfast. Wife still concerned about drainage form BK pop incision and ankle incision ? ? ?Vitals:  ? 12/16/21 0814 12/16/21 0817  ?BP: 131/66 131/66  ?Pulse:  (P) 97  ?Resp:  14  ?Temp:  (P) 97.9 ?F (36.6 ?C)  ?SpO2:    ? ?Physical Exam: ?Cardiac:  regular ?Lungs:  non labored ?Incisions:  left leg incisions are all intact. There is some serosanguinous drainage from BK pop incision and medial ankle incisions ?Extremities:  perfused and warm with Doppler Dp and PT signals  ?Neurologic: alert and oriented ? ?CBC ?   ?Component Value Date/Time  ? WBC 10.3 12/16/2021 0244  ? RBC 2.96 (L) 12/16/2021 0244  ? HGB 8.1 (L) 12/16/2021 0244  ? HCT 25.9 (L) 12/16/2021 0244  ? PLT 248 12/16/2021 0244  ? MCV 87.5 12/16/2021 0244  ? MCH 27.4 12/16/2021 0244  ? MCHC 31.3 12/16/2021 0244  ? RDW 15.3 12/16/2021 0244  ? LYMPHSABS 1.4 04/02/2018 1533  ? MONOABS 0.8 04/02/2018 1533  ? EOSABS 0.6 04/02/2018 1533  ? BASOSABS 0.0 04/02/2018 1533  ? ? ?BMET ?   ?Component Value Date/Time  ? NA 134 (L) 12/10/2021 1431  ? K 4.6 12/10/2021 1431  ? CL 105 12/07/2021 0224  ? CO2 25 12/07/2021 0224  ? GLUCOSE 117 (H) 12/07/2021 0224  ? BUN 12 12/07/2021 0224  ? CREATININE 0.73 12/07/2021 0224  ? CALCIUM 8.6 (L) 12/07/2021 0224  ? GFRNONAA >60 12/07/2021 0224  ? GFRAA >60 08/10/2019 1536  ? ? ?INR ?   ?Component Value Date/Time  ? INR 1.7 (H) 12/16/2021 0244  ? ? ? ?Intake/Output Summary (Last 24 hours) at 12/16/2021 0817 ?Last data filed at 12/16/2021 0356 ?Gross per 24 hour  ?Intake --  ?Output 900 ml  ?Net -900 ml  ? ? ? ?Assessment/Plan:  81 y.o. male is s/p 1) left greater saphenous vein harvest ?2) creation of left posterior tibial artery and vein arteriovenous fistula ?3) left below knee popliteal artery to left posterior tibial artery vein fistula bypass ?4) left second toe partial amputation  ?10 Days Post-Op ?And Repair of left  popliteal to posterior tibial artery bypass graft proximal anastomosis  ? 6 Days Post-Op  ?  ?LLE remains well perfused and warm. Doppler Dp/PT signal ?Ischemic toe stable ?Mild serosanguinous drainage from BK and ankle incisions. Apply dry gauze as needed ?Elevate LLE while in bed ?Continue to mobilize as tolerated ?Pending auth for CIR ? ?DVT prophylaxis:   ? ? ?Karoline Caldwell, PA-C ?[facility] ?[phone] ?12/16/2021 ?8:17 AM ? ?VASCULAR STAFF ADDENDUM: ?I have independently interviewed and examined the patient. ?I agree with the above.  ?Drainage from incisions due to postoperative edema. Continue local care, elevation. ?Disposition to inpatient rehab whenever bed available / insurance approves. ?Outpatient follow up with podiatry for toe amputation. ? ?Yevonne Aline. Stanford Breed, MD ?[facility] [place] ?Office Phone Number: [phone] ?12/16/2021 11:17 AM ? ? ?

## 2021-12-17 LAB — PROTIME-INR
INR: 1.7 — ABNORMAL HIGH (ref 0.8–1.2)
Prothrombin Time: 20.4 seconds — ABNORMAL HIGH (ref 11.4–15.2)

## 2021-12-17 MED ORDER — WARFARIN SODIUM 5 MG PO TABS
6.0000 mg | ORAL_TABLET | Freq: Once | ORAL | Status: AC
  Administered 2021-12-17: 6 mg via ORAL
  Filled 2021-12-17: qty 1

## 2021-12-17 NOTE — Progress Notes (Addendum)
ANTICOAGULATION CONSULT NOTE  ? ?Pharmacy Consult for Warfarin ?Indication: DVT and stroke ? ?Allergies  ?Allergen Reactions  ? Sulfa Antibiotics   ?  Weak and dizziness  ? ? ?Patient Measurements: ?Height: 5\' 8"  (172.7 cm) ?Weight: 91.9 kg (202 lb 9.6 oz) ?IBW/kg (Calculated) : 68.4 ? ?Vital Signs: ?Temp: 98 ?F (36.7 ?C) (03/19 0739) ?Temp Source: Oral (03/19 0739) ?BP: 131/51 (03/19 0739) ?Pulse Rate: 79 (03/19 0739) ? ?Labs: ?Recent Labs  ?  12/15/21 ?0153 12/16/21 ?0244 12/17/21 ?0110  ?HGB  --  8.1*  --   ?HCT  --  25.9*  --   ?PLT  --  248  --   ?LABPROT 18.3* 20.2* 20.4*  ?INR 1.5* 1.7* 1.7*  ? ? ? ?Estimated Creatinine Clearance: 79.7 mL/min (by C-G formula based on SCr of 0.73 mg/dL). ? ? ?Assessment: ?81 yo male who is now s/p tibial artery vein fistula bypass w/ repair 3/12. Pt was on warfarin PTA for a hx PAD/CVA/DVT. Pharmacy consulted to resume warfarin with no bridge. INR subtherapeutic on admission. ? ?Prior to admission warfarin dose was 4mg  daily.  ? ?INR low at 1.7 but trending up after restarting warfarin 3/13. No s/sx bleeding reported. We have been titrating warfarin up slowly hoping for gradual increase in INR noted per surgery. No bleeding issues noted.  ? ?Goal of Therapy:  ?INR 2-3 ?Monitor platelets by anticoagulation protocol: Yes ?  ?Plan:  ?Give warfarin 6 mg today ?Ceck INR daily while on warfarin ?Continue to monitor H&H and platelets ? ?Thank you for allowing pharmacy to participate in this patient's care. ? ?Reatha Harps, PharmD ?PGY1 Pharmacy Resident ?12/17/2021 8:53 AM ?Check [email or web address] for unit specific pharmacy number ? ? ?

## 2021-12-17 NOTE — Progress Notes (Signed)
Mobility Specialist Progress Note ? ? 12/17/21 1400  ?Mobility  ?Activity Transferred from chair to bed  ?Level of Assistance Moderate assist, patient does 50-74%  ?Assistive Device Four wheel walker  ?LLE Weight Bearing PWB  ?Distance Ambulated (ft) 2 ft  ?Activity Response Tolerated fair  ? ?Pt received in chair requesting to get back to bed d/t intense neck pain. Requiring max VC for sequencing, pt unable to maintain focus w/ pain. Max A for incline out of chair and modA for SPT to EOB. Pt unable to fully turn to EOB requiring me to guide pt hips to EOB. Left call bell in reach and notified RN about pain and tx/ ? ?Frederico Hamman ?Mobility Specialist ?Phone Number [phone] ? ?

## 2021-12-17 NOTE — Progress Notes (Addendum)
?  Progress Note ? ? ? ?12/17/2021 ?8:26 AM ?7 Days Post-Op ? ?Subjective:  no major complaints. Sitting up in chair ? ? ?Vitals:  ? 12/17/21 0637 12/17/21 0739  ?BP:  (!) 131/51  ?Pulse:  79  ?Resp: 19 16  ?Temp:  98 ?F (36.7 ?C)  ?SpO2:  97%  ? ?Physical Exam: ?Cardiac:  regular ?Lungs:  non labored ?Incisions:  Left lower extremity incisions are intact and well appearing. Very minimal serosanguinous drainage from BK pop incision and medial ankle incisions ?Extremities:  well perfused and warm with doppler PT/DP signals ?Neurologic: alert and oriented ? ?CBC ?   ?Component Value Date/Time  ? WBC 10.3 12/16/2021 0244  ? RBC 2.96 (L) 12/16/2021 0244  ? HGB 8.1 (L) 12/16/2021 0244  ? HCT 25.9 (L) 12/16/2021 0244  ? PLT 248 12/16/2021 0244  ? MCV 87.5 12/16/2021 0244  ? MCH 27.4 12/16/2021 0244  ? MCHC 31.3 12/16/2021 0244  ? RDW 15.3 12/16/2021 0244  ? LYMPHSABS 1.4 04/02/2018 1533  ? MONOABS 0.8 04/02/2018 1533  ? EOSABS 0.6 04/02/2018 1533  ? BASOSABS 0.0 04/02/2018 1533  ? ? ?BMET ?   ?Component Value Date/Time  ? NA 134 (L) 12/10/2021 1431  ? K 4.6 12/10/2021 1431  ? CL 105 12/07/2021 0224  ? CO2 25 12/07/2021 0224  ? GLUCOSE 117 (H) 12/07/2021 0224  ? BUN 12 12/07/2021 0224  ? CREATININE 0.73 12/07/2021 0224  ? CALCIUM 8.6 (L) 12/07/2021 0224  ? GFRNONAA >60 12/07/2021 0224  ? GFRAA >60 08/10/2019 1536  ? ? ?INR ?   ?Component Value Date/Time  ? INR 1.7 (H) 12/17/2021 0110  ? ? ? ?Intake/Output Summary (Last 24 hours) at 12/17/2021 0826 ?Last data filed at 12/17/2021 0740 ?Gross per 24 hour  ?Intake 840 ml  ?Output 2050 ml  ?Net -1210 ml  ? ? ? ?Assessment/Plan:  81 y.o. male is s/p 1) left greater saphenous vein harvest ?2) creation of left posterior tibial artery and vein arteriovenous fistula ?3) left below knee popliteal artery to left posterior tibial artery vein fistula bypass ?4) left second toe partial amputation  ?11 Days Post-Op ?And Repair of left popliteal to posterior tibial artery bypass graft proximal  anastomosis  7 Days Post-Op  ? ?LLE remains well perfused and warm. Doppler Dp/PT signal ?Ischemic toe stable ?Mild serosanguinous drainage from BK and ankle incisions- slowing down. Apply dry gauze as needed ?Elevate LLE while in bed ?Continue to mobilize as tolerated ?Pending auth for CIR ?Outpt follow up with podiatry for toe amp ? ? ?DVT prophylaxis:  coumadin- INR 1.7 ? ? ?Karoline Caldwell, PA-C ?[facility] ?[phone] ?12/17/2021 ?8:26 AM ? ?VASCULAR STAFF ADDENDUM: ?I have independently interviewed and examined the patient. ?I agree with the above.  ? ?Yevonne Aline. Stanford Breed, MD ?[facility] [place] ?Office Phone Number: [phone] ?12/17/2021 10:59 AM ? ? ?

## 2021-12-18 LAB — PROTIME-INR
INR: 2.3 — ABNORMAL HIGH (ref 0.8–1.2)
Prothrombin Time: 25.6 seconds — ABNORMAL HIGH (ref 11.4–15.2)

## 2021-12-18 MED ORDER — ASCORBIC ACID 500 MG PO TABS
500.0000 mg | ORAL_TABLET | Freq: Every day | ORAL | Status: DC
  Administered 2021-12-19 – 2021-12-20 (×2): 500 mg via ORAL
  Filled 2021-12-18 (×2): qty 1

## 2021-12-18 MED ORDER — ADULT MULTIVITAMIN W/MINERALS CH
1.0000 | ORAL_TABLET | Freq: Every day | ORAL | Status: DC
  Administered 2021-12-18 – 2021-12-20 (×3): 1 via ORAL
  Filled 2021-12-18 (×3): qty 1

## 2021-12-18 MED ORDER — ZINC SULFATE 220 (50 ZN) MG PO CAPS
220.0000 mg | ORAL_CAPSULE | Freq: Every day | ORAL | Status: DC
  Administered 2021-12-19 – 2021-12-20 (×2): 220 mg via ORAL
  Filled 2021-12-18 (×2): qty 1

## 2021-12-18 MED ORDER — WARFARIN SODIUM 4 MG PO TABS
4.0000 mg | ORAL_TABLET | Freq: Once | ORAL | Status: AC
  Administered 2021-12-18: 4 mg via ORAL
  Filled 2021-12-18: qty 1

## 2021-12-18 MED ORDER — ENSURE ENLIVE PO LIQD
237.0000 mL | Freq: Two times a day (BID) | ORAL | Status: DC
  Administered 2021-12-18 – 2021-12-20 (×4): 237 mL via ORAL

## 2021-12-18 NOTE — Progress Notes (Signed)
ANTICOAGULATION CONSULT NOTE  ? ?Pharmacy Consult for Warfarin ?Indication: DVT and stroke ? ?Allergies  ?Allergen Reactions  ? Sulfa Antibiotics   ?  Weak and dizziness  ? ? ?Patient Measurements: ?Height: 5\' 8"  (172.7 cm) ?Weight: 91.9 kg (202 lb 9.6 oz) ?IBW/kg (Calculated) : 68.4 ? ?Vital Signs: ?Temp: 98.6 ?F (37 ?C) (03/20 0023) ?Temp Source: Oral (03/20 0023) ?BP: 132/66 (03/20 0023) ?Pulse Rate: 79 (03/20 0023) ? ?Labs: ?Recent Labs  ?  12/16/21 ?0244 12/17/21 ?0110 12/18/21 ?0406  ?HGB 8.1*  --   --   ?HCT 25.9*  --   --   ?PLT 248  --   --   ?LABPROT 20.2* 20.4* 25.6*  ?INR 1.7* 1.7* 2.3*  ? ? ? ?Estimated Creatinine Clearance: 79.7 mL/min (by C-G formula based on SCr of 0.73 mg/dL). ? ? ?Assessment: ?81 yo male who is now s/p tibial artery vein fistula bypass w/ repair 3/12. Pt was on warfarin PTA for a hx PAD/CVA/DVT. Pharmacy consulted to resume warfarin with no bridge. INR subtherapeutic on admission as warfarin had been held for procedure. ? ?Prior to admission warfarin dose was 4mg  daily.  ? ?INR now up to therapeutic (2.2) - large jump past 24 hours. Hgb 8s - low but stable.  ? ?Goal of Therapy:  ?INR 2-3 ?Monitor platelets by anticoagulation protocol: Yes ?  ?Plan:  ?Give warfarin 4 mg today ?Ceck INR daily while on warfarin ? ?Thank you for allowing pharmacy to participate in this patient's care. ? ?Sherlon Handing, PharmD, BCPS ?Please see amion for complete clinical pharmacist phone list ?12/18/2021 7:29 AM ?Check [email or web address] for unit specific pharmacy number ? ? ?

## 2021-12-18 NOTE — TOC Progression Note (Signed)
Transition of Care (TOC) - Progression Note  ? ? ?Patient Details  ?Name: Jose Bush ?MRN: [id number] ?Date of Birth: 05/24/1941 ? ?Transition of Care (TOC) CM/SW Contact  ?Eduard Roux, LCSW ?Phone Number: ?12/18/2021, 11:47 AM ? ?Clinical Narrative:    ? ?Called patient's wife to start the SNF process. She declined expressing " i'm not ready for that yet". CSW advised, although patient is not ready for discharge, it is good to get the process started early to ensure we can secure a SNF of her choice. She declined at this time.  ? ?TOC will continue to follow and assist with discharge planning. ? ?Antony Blackbird, MSW, LCSW ?Clinical Social Worker ? ? ? ?Expected Discharge Plan: IP Rehab Facility ?Barriers to Discharge: English as a second language teacher, SNF Pending bed offer ? ?Expected Discharge Plan and Services ?Expected Discharge Plan: IP Rehab Facility ?In-house Referral: Clinical Social Work ?  ?  ?Living arrangements for the past 2 months: Single Family Home ?                ?  ?  ?  ?  ?  ?  ?  ?  ?  ?  ? ? ?Social Determinants of Health (SDOH) Interventions ?  ? ?Readmission Risk Interventions ?No flowsheet data found. ? ?

## 2021-12-18 NOTE — Progress Notes (Signed)
Inpatient Rehabilitation Admissions Coordinator  ? ?The appeal for CIR has been denied per [facility] expedited appeal. I have contacted his wife and she is aware. Acute team made aware. We will sign off at this time. ? ?Ottie Glazier, RN, MSN ?Rehab Admissions Coordinator ?[phone] ?12/18/2021 8:38 AM ? ?

## 2021-12-18 NOTE — Progress Notes (Signed)
Mobility Specialist Progress Note ? ?Pt inappropriate for mobility specialist at this time given level of complexity, physical assist, and/or precautions as advised by PT,OT & MD team. Mobility specialist to hold today, will continue to follow for readiness. ? ?Frederico Hamman ?Mobility Specialist ?Phone Number [phone] ? ?

## 2021-12-18 NOTE — Progress Notes (Addendum)
Physical Therapy Treatment ?Patient Details ?Name: Jose Bush ?MRN: [id number] ?DOB: 08/03/1941 ?Today's Date: 12/18/2021 ? ? ?History of Present Illness Pt is an 81 y/o male who presented 12/06/21 for evaluation of L 1st and 2nd toe ulceration with hx of PVD. Pt underwent L popliteal artery to posterior tibial artery bypass and partial L second toe amputation 3/8 PWB on left heel.  3/12 returned to OR for bleeding. PMH: R BKA, a fib, BPH, PAD, PE, CVA, shingles, GERD, aortic stenosis. ? ?  ?PT Comments  ? ? Pt received in supine, agreeable to therapy session with emphasis on supine BLE exercises, bed mobility and seated balance. Pt instructed on LLE NWB status with emphasis on avoiding LLE pushing through bed with repositioning, pt did need intermittent cues for compliance. Pt more lethargic today so defer OOB mobility due to pt lethargy and LLE NWB, pt would have difficulty maintaining this with level of fatigue. Pt needing up to minA to perform transfer to/from EOB and modA for posterior supine scooting toward HOB using only BUE and bed in trendelenburg. Pt will need goal update next session by supervising PT given now NWB on LLE status, disposition updated per discussion with supervising PT Lynnell Jude and per chart review family considering disposition options, DME updated in case of alternative plan.  ?Recommendations for follow up therapy are one component of a multi-disciplinary discharge planning process, led by the attending physician.  Recommendations may be updated based on patient status, additional functional criteria and insurance authorization. ? ?Follow Up Recommendations ? Skilled nursing-short term rehab (<3 hours/day) ?  ?  ?Assistance Recommended at Discharge Frequent or constant Supervision/Assistance  ?Patient can return home with the following Two people to help with walking and/or transfers;Assistance with cooking/housework;Direct supervision/assist for medications management;Direct  supervision/assist for financial management;Assist for transportation;Help with stairs or ramp for entrance;A lot of help with bathing/dressing/bathroom ?  ?Equipment Recommendations ? Other (comment) (pending WB status upon DC; if NWB, will likely need wheelchair and slide board, may need mechanical lift and hospital bed)  ?  ?Recommendations for Other Services   ? ? ?  ?Precautions / Restrictions Precautions ?Precautions: Fall ?Precaution Comments: hx of R BKA with prosthetic, LLE now at risk per MD and NWB ?Required Braces or Orthoses: Other Brace ?Other Brace: L darco shoe (hold-currently NWB) ?Restrictions ?Weight Bearing Restrictions: Yes ?LLE Weight Bearing: Non weight bearing ?Other Position/Activity Restrictions: Dr Stanford Breed note 3/20 updated to NWB  ?  ? ?Mobility ? Bed Mobility ?Overal bed mobility: Needs Assistance ?Bed Mobility: Rolling, Sidelying to Sit, Sit to Supine ?Rolling: Modified independent (Device/Increase time) ?Sidelying to sit: Min assist ?  ?Sit to supine: Min assist ?  ?General bed mobility comments: pt reliant on R rail for trunk rise to sit on R EOB. pt needing modA for posterior supine scoot toward HOB using only BUE and staff assist with bed pad/bed in trendelenburg ?  ? ?Transfers ?  ?  ?General transfer comment: attempted to have pt perform seated scooting toward R side at EOB, pt compliant with L NWB but unable to significantly move hips using only UE so instead performed posterior supine scooting, see above ?  ? ?Ambulation/Gait ? General Gait Details: UTA; now NWB on LLE ? ? ? ? ?  ?Balance Overall balance assessment: Needs assistance ?Sitting-balance support: Bilateral upper extremity supported, Feet supported ?Sitting balance-Leahy Scale: Fair ?Sitting balance - Comments: posterior bias, increases with BUE seated ROM; seated anterior reaching activity improves midline posture ?  ?  ?  ?  Standing balance comment: UTA; NWB LLE, defer for safety as pt too lethargic ?  ?   ? ?   ?Cognition Arousal/Alertness: Lethargic ?Behavior During Therapy: [facility] for tasks assessed/performed ?Overall Cognitive Status: Impaired/Different from baseline ?Area of Impairment: Safety/judgement, Awareness, Problem solving ?   ?Safety/Judgement: Decreased awareness of deficits ?Awareness: Intellectual ?Problem Solving: Slow processing, Requires verbal cues ?General Comments: pt drowsy throughout, occasionally needing cues to open eyes; slow processing, aware of new NWB precs and needs min cues for compliance ?  ?  ? ?  ?Exercises Other Exercises ?Other Exercises: supine LLE AROM: glute sets, quad sets, hip ADduction, hip aBduction, SLR x10 reps ea ; supine RLE AROM: hip extension, hip flexion x10 reps ea ?Other Exercises: seated lateral leans with elbow taps x5 reps ea side ?Other Exercises: seated BUE AROM: elbow flex/ext, shoulder flex (LUE) x10 reps ea ?Other Exercises: seated BUE reaching anterior, lateral x10 reps ? ?  ?General Comments General comments (skin integrity, edema, etc.): HR 79 bpm resting and to 94 bpm with seated exercises; BP 115/53 (72) supine; no dizziness reported ?  ?  ? ?Pertinent Vitals/Pain Pain Assessment ?Pain Assessment: Faces ?Faces Pain Scale: Hurts even more ?Pain Location: LLE and intermittent hip "spasms" ?Pain Descriptors / Indicators: Discomfort, Guarding, Spasm, Sore, Shooting ?Pain Intervention(s): Monitored during session, Limited activity within patient's tolerance, Repositioned  ? ? ? ?PT Goals (current goals can now be found in the care plan section) Acute Rehab PT Goals ?Patient Stated Goal: to go to rehab then home ?PT Goal Formulation: With patient/family ?Time For Goal Achievement: 12/21/21 ?Progress towards PT goals: Progressing toward goals;PT to reassess next treatment (LLE NWB status limiting mobility progression) ? ?  ?Frequency ? ? ? Min 3X/week ? ? ? ?  ?PT Plan Discharge plan needs to be updated;Equipment recommendations need to be updated  ? ? ?   ?AM-PAC PT  "6 Clicks" Mobility   ?Outcome Measure ? Help needed turning from your back to your side while in a flat bed without using bedrails?: A Little ?Help needed moving from lying on your back to sitting on the side of a flat bed without using bedrails?: A Lot ?Help needed moving to and from a bed to a chair (including a wheelchair)?: Total ?Help needed standing up from a chair using your arms (e.g., wheelchair or bedside chair)?: Total ?Help needed to walk in hospital room?: Total ?Help needed climbing 3-5 steps with a railing? : Total ?6 Click Score: 9 ? ?  ?End of Session   ?Activity Tolerance: Patient tolerated treatment well;Patient limited by lethargy ?Patient left: with call bell/phone within reach;in bed;with bed alarm set;Other (comment) (LLE elevated/heel floated) ?Nurse Communication: Mobility status ?PT Visit Diagnosis: Unsteadiness on feet (R26.81);Muscle weakness (generalized) (M62.81);History of falling (Z91.81);Difficulty in walking, not elsewhere classified (R26.2);Pain ?Pain - Right/Left: Left ?Pain - part of body: Ankle and joints of foot ?  ? ? ?Time: [id number] ?PT Time Calculation (min) (ACUTE ONLY): 26 min ? ?Charges:  $Therapeutic Exercise: 8-22 mins ?$Therapeutic Activity: 8-22 mins          ?          ? ?Stacy Sailer P., PTA ?Acute Rehabilitation Services ?Pager: [phone] ?Office: [phone]  ? ? ?Kara Pacer Allanna Bresee ?12/18/2021, 12:41 PM ? ?

## 2021-12-18 NOTE — Progress Notes (Addendum)
[facility] [place] ? ?Subjective  - He states he had significant pain yesterday in the whole left LE.  The pain is still present, but better. ? ? ?Objective ?132/66 ?79 ?98.6 ?F (37 ?C) (Oral) ?20 ?97% ? ?Intake/Output Summary (Last 24 hours) at 12/18/2021 0724 ?Last data filed at 12/17/2021 1324 ?Gross per 24 hour  ?Intake 600 ml  ?Output 0 ml  ?Net 600 ml  ? ? ?Doppler DP left foot, iMotor and sensation grossly intact left LE ?Lungs non labored breathing  ? ?Assessment/Planning: ?POD # 8  ? ? 81 y.o. male is s/p 1) left greater saphenous vein harvest ?2) creation of left posterior tibial artery and vein arteriovenous fistula ?3) left below knee popliteal artery to left posterior tibial artery vein fistula bypass ?4) left second toe partial amputation  ?11 Days Post-Op ?And Repair of left popliteal to posterior tibial artery bypass graft proximal anastomosis ? ?LLE motor, sensation grossly intac with DP doppler signal.  I'm not sure what to make of the sudden pain in the whole left LE.  His pain seems well controlled now and there does seem to be a change in exam.  We will observe.   ? ?Pending DPM toe amp in the future for dry gangrene. ? ?Pending CIR ?INR 2.3 ? ? ?Roxy Horseman ?12/18/2021 ?7:24 AM ?-- ? ?Laboratory ?Lab Results: ?Recent Labs  ?  12/16/21 ?[id number]  ?WBC 10.3  ?HGB 8.1*  ?HCT 25.9*  ?PLT 248  ? ?BMET ?No results for input(s): NA, K, CL, CO2, GLUCOSE, BUN, CREATININE, CALCIUM in the last 72 hours. ? ?COAG ?Lab Results  ?Component Value Date  ? INR 2.3 (H) 12/18/2021  ? INR 1.7 (H) 12/17/2021  ? INR 1.7 (H) 12/16/2021  ? ?No results found for: PTT ? ?VASCULAR STAFF ADDENDUM: ?I have independently interviewed and examined the patient. ?I agree with the above, with corrections as noted.  ?Left posterior tibial exposure incision is dehiscing.  ?Good doppler flow in the foot. ?No weight bearing to RLE ?If wound looks similar or no worse tomorrow, will need to pursue left above  knee amputation ?Hold Coumadin. ? ?Yevonne Aline. Stanford Breed, MD ?[facility] [place] ?Office Phone Number: [phone] ?12/18/2021 10:02 AM ? ? ? ?

## 2021-12-18 NOTE — Progress Notes (Addendum)
Initial Nutrition Assessment ? ?DOCUMENTATION CODES:  ? ?Non-severe (moderate) malnutrition in context of acute illness/injury ? ?INTERVENTION:  ? ?Ensure Enlive po BID, each supplement provides 350 kcal and 20 grams of protein. ?MVI with minerals daily. ?Vitamin C 500 mg daily to support wound healing. ?Zinc 220 mg daily x 14 days to support wound healing. ? ?NUTRITION DIAGNOSIS:  ? ?Moderate Malnutrition related to acute illness (non healing ulcers requiring repeat surgery) as evidenced by mild muscle depletion, mild fat depletion, moderate muscle depletion. ? ?GOAL:  ? ?Patient will meet greater than or equal to 90% of their needs ? ?MONITOR:  ? ?PO intake, Supplement acceptance, Labs, Skin ? ?REASON FOR ASSESSMENT:  ? ?Malnutrition Screening Tool ?  ? ?ASSESSMENT:  ? ?81 yo male admitted with L first and second toe ulceration. PMH includes PVD, R BKA, GERD, Lupus, stroke, HTN, BPH, a fib. ? ?3/8 - S/P L posterior tibial vein arterialization. ?3/12 - S/P emergent repair of left popliteal to posterior tibial artery bypass graft proximal anastomosis, and left foot second toe partial amputation.   ? ?Patient is currently on a regular diet. ?Meal intakes: 50-100% (average 78% for the past 8 meals documented) ?Insurance denied CIR placement. ? ?Intake PTA was good per patient report; he ate 3 meals per day, a wide variety of items. Patient states that he is eating well now. He likes the chicken noodle soup. The meals he is ordering are lower in protein. Discussed ways to increase protein in his diet. He agreed to drink Ensure supplements between meals to maximize protein intake. Will also add MVI with minerals, vitamin C, and zinc to support wound healing.  ? ?Labs reviewed.  ?Medications reviewed and include prednisone, warfarin. ? ?Weight history reviewed. 3% weight loss within the past month, not severe, but concerning given increased nutrition needs for healing.  ? ?Patient meets criteria for moderate malnutrition  with mild-moderate depletion of muscle and subcutaneous fat mass. ?  ?NUTRITION - FOCUSED PHYSICAL EXAM: ? ?Flowsheet Row Most Recent Value  ?Orbital Region Mild depletion  ?Upper Arm Region Mild depletion  ?Thoracic and Lumbar Region Mild depletion  ?Buccal Region Mild depletion  ?Temple Region No depletion  ?Clavicle Bone Region Moderate depletion  ?Clavicle and Acromion Bone Region Mild depletion  ?Scapular Bone Region Mild depletion  ?Dorsal Hand Moderate depletion  ?Patellar Region Mild depletion  ?Anterior Thigh Region Unable to assess  ?Posterior Calf Region Unable to assess  ?Edema (RD Assessment) Mild  ?Hair Reviewed  ?Eyes Reviewed  ?Mouth Reviewed  ?Skin Reviewed  ?Nails Reviewed  ? ?  ? ? ?Diet Order:   ?Diet Order   ? ?       ?  Diet regular Room service appropriate? Yes with Assist; Fluid consistency: Thin  Diet effective now       ?  ? ?  ?  ? ?  ? ? ?EDUCATION NEEDS:  ? ?Education needs have been addressed ? ?Skin:  Skin Assessment: Skin Integrity Issues: (L leg incisions) ?Skin Integrity Issues:: Incisions, Other (Comment) ?Incisions: L leg ?Other: L first and second toe ulcerations ? ?Last BM:  3/19 ? ?Height:  ? ?Ht Readings from Last 1 Encounters:  ?12/06/21 5\' 8"  (1.727 m)  ? ? ?Weight:  ? ?Wt Readings from Last 1 Encounters:  ?12/11/21 91.9 kg  ? ? ?BMI:  Body mass index is 30.81 kg/m?. ? ?Estimated Nutritional Needs:  ? ?Kcal:  2000-2200 ? ?Protein:  120-135 gm ? ?Fluid:  2-2.2 L ? ? ? ?  Lucas Mallow RD, LDN, CNSC ?Please refer to Amion for contact information.                                                       ? ?

## 2021-12-19 DIAGNOSIS — I998 Other disorder of circulatory system: Secondary | ICD-10-CM | POA: Diagnosis not present

## 2021-12-19 DIAGNOSIS — Z515 Encounter for palliative care: Secondary | ICD-10-CM

## 2021-12-19 DIAGNOSIS — I70229 Atherosclerosis of native arteries of extremities with rest pain, unspecified extremity: Secondary | ICD-10-CM | POA: Diagnosis not present

## 2021-12-19 DIAGNOSIS — I739 Peripheral vascular disease, unspecified: Secondary | ICD-10-CM | POA: Diagnosis not present

## 2021-12-19 DIAGNOSIS — Z66 Do not resuscitate: Secondary | ICD-10-CM

## 2021-12-19 DIAGNOSIS — Z7189 Other specified counseling: Secondary | ICD-10-CM

## 2021-12-19 DIAGNOSIS — I70249 Atherosclerosis of native arteries of left leg with ulceration of unspecified site: Secondary | ICD-10-CM | POA: Diagnosis not present

## 2021-12-19 LAB — PROTIME-INR
INR: 2.3 — ABNORMAL HIGH (ref 0.8–1.2)
Prothrombin Time: 25.6 seconds — ABNORMAL HIGH (ref 11.4–15.2)

## 2021-12-19 MED ORDER — MORPHINE SULFATE (CONCENTRATE) 10 MG/0.5ML PO SOLN
5.0000 mg | ORAL | Status: DC | PRN
  Administered 2021-12-19: 10 mg via ORAL
  Administered 2021-12-19: 5 mg via ORAL
  Administered 2021-12-20 (×2): 10 mg via ORAL
  Filled 2021-12-19 (×4): qty 0.5

## 2021-12-19 NOTE — Progress Notes (Addendum)
[facility] [place] ? ?Subjective  - Pain over night seems to be coming unbearable.  They are not sure if they want to proceed with amputation.  Waiting to talk to Dr. Stanford Breed again. ? ? ?Objective ?(!) 159/66 ?88 ?99.1 ?F (37.3 ?C) (Oral) ?17 ?97% ? ?Intake/Output Summary (Last 24 hours) at 12/19/2021 0720 ?Last data filed at 12/19/2021 0403 ?Gross per 24 hour  ?Intake 240 ml  ?Output 2000 ml  ?Net -1760 ml  ? ? ?Left medial ankle incision has small amount of purulent drainage and the wound has broken down  ? ? ? ? ?The leg incisions are still healing well and there is an intact doppler DP signal.   ?Medial ankle incision separation, no erythema or increased edema.  Picture taken today for documentation. ? ?Lungs non labored breathing ? ?Assessment/Planning: ? 81 y.o. male is s/p 1) left greater saphenous vein harvest ?2) creation of left posterior tibial artery and vein arteriovenous fistula ?3) left below knee popliteal artery to left posterior tibial artery vein fistula bypass ?4) left second toe partial amputation  ? ?Limb salvage attempt with distal target site wound dehiscence that has been present for a while.    Dr. Stanford Breed spoke with the patient and his wife.  Pending his exam today he may need to consider primary amputation.  The surrounding tissue at the PT incision is without erythema or edema.   ?No leukocytosis and he remains afebrile Tm 99.1.  His CC is pain.   ? ?Patient has increased pain in the left foot/toe  ?Coumadin on hold today.  The Coumadin was not held yesterday no order was placed.  I talked with pharmacy and we will hold it today until Dr. Stanford Breed confirms return to OR or not.   ? ? ? ?Roxy Horseman ?12/19/2021 ?7:20 AM ?-- ? ?Laboratory ?Lab Results: ?No results for input(s): WBC, HGB, HCT, PLT in the last 72 hours. ?BMET ?No results for input(s): NA, K, CL, CO2, GLUCOSE, BUN, CREATININE, CALCIUM in the last 72 hours. ? ?COAG ?Lab Results  ?Component Value Date   ? INR 2.3 (H) 12/19/2021  ? INR 2.3 (H) 12/18/2021  ? INR 1.7 (H) 12/17/2021  ? ?No results found for: PTT ? ? ?VASCULAR STAFF ADDENDUM: ?I have independently interviewed and examined the patient. ?I agree with the above.  ?Mr. Stemen' ankle wound is deteriorating slowly, as is his second toe. ?Unfortunately, no further options exist for limb salvage. ?His only option for further curative therapy is an above-knee amputation on the left. ?Mr. Coello and his wife both want to go home.   ?I explained the concept of palliative care and comfort measures to him. ?They both seem interested in this. ?We will ask the palliative care service to see the patient today. ?Anticipate discharge home with hospice in the next 24 to 48 hours. ?No plans for operative intervention at this time. ? ?Yevonne Aline. Stanford Breed, MD ?[facility] [place] ?Office Phone Number: [phone] ?12/19/2021 10:05 AM ? ? ?

## 2021-12-19 NOTE — TOC Progression Note (Signed)
Transition of Care (TOC) - Progression Note  ?Marvetta Gibbons Therapist, sports, BSN ?Transitions of Care ?Unit 4E- RN Case Manager ?See Treatment Team for direct phone #  ? ? ?Patient Details  ?Name: Jose Bush ?MRN: [id number] ?Date of Birth: Jun 28, 1941 ? ?Transition of Care (TOC) CM/SW Contact  ?Dawayne Patricia, RN ?Phone Number: ?12/19/2021, 4:25 PM ? ?Clinical Narrative:    ?Msg received from Placido Sou. From [facility]- she has met with pt and wife and decision has been made to return home with Home Hospice services. Pt and wife are requesting referral be made to [place] - no DME needs per [facility].  ?Pt currently on IV pain meds and will need to be able to manage pain off IV meds.  ? ?Call made to [facility] for [place] left for intake- awaiting return call. Referral also placed in [facility].  ? ?CM to follow up  ? ? ?Expected Discharge Plan: [place] ?Barriers to Discharge: Continued Medical Work up ? ?Expected Discharge Plan and Services ?Expected Discharge Plan: [place] ?In-house Referral: Clinical Social Work ?Discharge Planning Services: CM Consult ?Post Acute Care Choice: Hospice, [place], IP Rehab ?Living arrangements for the past 2 months: [place] ?                ?  ?  ?  ?  ?  ?  ?[place] Agency: [facility] ?Date [place]: 12/19/21 ?Time [place]: [id number] ?Representative spoke with at [place]: msg left ? ? ?Social Determinants of Health (SDOH) Interventions ?  ? ?Readmission Risk Interventions ?Readmission Risk Prevention Plan 12/19/2021  ?Transportation Screening Complete  ?PCP or Specialist Appt within 5-7 Days Complete  ?Home Care Screening Complete  ?Medication Review (RN CM) Complete  ?Some recent data might be hidden  ? ? ?

## 2021-12-19 NOTE — Consult Note (Addendum)
? ?                                                                                ?Consultation Note ?Date: 12/19/2021  ? ?Patient Name: Jose Bush  ?DOB: [birth year]  MRN: [id number]  Age / Sex: 81 y.o., male  ?PCP: Jose Bal, PA-C ?Referring Physician: [name], MD ? ?Reason for Consultation: Establishing goals of care  ? ?HPI/Patient Profile: 81 y.o. male  with past medical history of peripheral artery disease, right BKA in 2019, atrial fibrillation, history of DVT and PE, history of stroke. He presented  on 12/06/2021 for evaluation of left 1st and 2nd toe ulceration. Admitted to vascular surgery.  ?3/8 - He underwent left popliteal artery to posterior tibial artery bypass and partial left second toe amputation.  ?3/12 - He returned to the OR for repair of left popliteal to posterior tibial artery vein fistula bypass.   ? ?PMT was asked to assist with goals of care in the setting of increasing left LE pain and no further revascularization options available.  ? ?Clinical Assessment and Goals of Care: ?I have reviewed medical records including EPIC notes, labs and imaging, and met at bedside with patient and his wife/Jose Bush to discuss diagnosis, prognosis, GOC, EOL wishes, disposition, and options. He reports left LE pain. Per MAR review, he received morphine 3 mg at 1246. He reports this partially relieves the pain. He is lethargic and sleepy through our discussion, and I am frequently waking him up to answer questions.  ? ?I introduced Palliative Medicine as specialized medical care for people living with serious illness. It focuses on providing relief from the symptoms and stress of a serious illness.  ? ?We discussed a brief life review of the patient. Jose Bush is retired from the [employer]. He and Jose Bush have been married for 17 years (today is their anniversary). He and Jose Bush have 2 children (1 daughter and 1 son), 5 grandchildren, and 2 great-grandchildren. ? ?Jose Bush and  Jose Bush live together in their home in [place]. Their children live nearby in [place]. As far as functional status, Jose Bush has been unable to bear weight on his left foot for the past 3 months. Prior to that he was ambulatory with a walker, and used a scooter to mobilize outside. He uses a prothesis for his right BKA.  ? ?We discussed his current illness and what it means in the larger context of his ongoing co-morbidities.  Natural disease trajectory of chronic was discussed, emphasizing that it is progressive and non-curable. We discussed [name]'s current medical condition from a vascular standpoint - unfortunately there are very limited options. Discussed that the options are amputation of the left lower extremity (likely AKA) or transition to comfort care. Discussed that amputation would have risks including poor wound healing and/or infection.  I provided education and counseling that without amputation, the ischemia will persist and this will result in continued pain and worsening infection/gangrene. Discussed that it would ultimately become a terminal situation.  ? ?[name] and Jose Bush confirm that he does not want amputation. They verbalize understanding this would be a life-limiting situation. He wants to go home. We discussed that he  will need ongoing symptom management for the LE pain.  I provided education on the concept of comfort focused care, emphasizing that this means allowing a natural course to occur with the goal of comfort and dignity rather than cure/prolonging life.  ? ?Provided education and counseling at length on the philosophy and benefits of hospice care. Discussed that it offers a holistic approach to care in the setting of end-stage illness/disease, and is about supporting the patient where they are. Discussed the hospice team includes RNs, physicians, social workers, and chaplains. They can provide personal care, symptom/pain management, and help keep patient out of the  hospital. ? ?We discussed code status. Encouraged DNR/DNI status understanding evidenced based poor outcomes in similar hospitalized patients, as the cause of the arrest is likely associated with chronic/terminal disease rather than a reversible acute cardio-pulmonary event. I explained that prognosis would be poor in the event of cardiopulmonary arrest and that DNR/DNI is a protective measure to keep [place] from harming the patient in their last moments of life. Patient and wife agree and DNR/DNI is appropriate and consistent with overall goals of care.   ? ?Jose Bush agrees with the addition of hospice support at home and states her preference for [facility].  ?Questions and concerns were addressed.  I provided Jose Bush with PMT contact info and encouraged her to call with questions or concerns.  ? ? ?Primary decision maker: Patient with support from his wife ?  ? ?SUMMARY OF RECOMMENDATIONS   ?Code status changed to DNR/DNI ?Continue current interventions ?Patient confirms he does not want surgical intervention (amputation) ?Goal is for home with hospice - [facility] - TOC order placed ?Will need to ensure pain is controlled on oral medication prior to discharge ? ?Symptom Management:  ?Morphine concentrate solution 5-10 mg every 3 hours as needed for moderate-severe pain ?Morphine IV 2 mg every 2 hours prn (only for severe pain not relieved by oral morphine) ?D/C tramadol ? ?Prognosis:  ?< 6 months ? ?Discharge Planning: Home with Hospice  ? ?  ? ?Primary Diagnoses: ?Present on Admission: ? Critical lower limb ischemia ([name]) ? ? ?I have reviewed the medical record, interviewed the patient and family, and examined the patient. The following aspects are pertinent. ? ?Past Medical History:  ?Diagnosis Date  ? Aortic stenosis   ? Aortic sclerosis without stenosis on echo 8/21  ? Arthritis   ? Atrial fibrillation ([place])   ? BPH (benign prostatic hyperplasia)   ? Essential hypertension   ? GERD  (gastroesophageal reflux disease)   ? Heart murmur   ? History of DVT (deep vein thrombosis)   ? Positive anticardiolipin  ? History of hiatal hernia   ? History of stroke 2010  ? Lupus ([place])   ? PAD (peripheral artery disease) ([place])   ? Right below-knee amputation in 2019  ? Pulmonary embolism ([place])   ? hx of 10 years ago  ? Shingles   ? Stroke [facility])   ? Tinnitus   ? Wears dentures   ? Wears glasses   ? ? ? ?Family History  ?Problem Relation Age of Onset  ? Hypertension Other   ? Stroke Mother   ? Stroke Father   ? Heart failure Sister   ? CAD Brother   ?     CABG age 67  ? Colon cancer Neg Hx   ? ?Scheduled Meds: ? sodium chloride   Intravenous Once  ? vitamin C  500 mg Oral Daily  ?  docusate sodium  100 mg Oral Daily  ? feeding supplement  237 mL Oral BID BM  ? lisinopril  10 mg Oral Daily  ? And  ? hydrochlorothiazide  12.5 mg Oral Daily  ? hydroxychloroquine  200 mg Oral q AM  ? melatonin  10 mg Oral QHS  ? multivitamin with minerals  1 tablet Oral Daily  ? pantoprazole  40 mg Oral Daily  ? polyethylene glycol  17 g Oral Daily  ? predniSONE  5 mg Oral Q breakfast  ? sodium phosphate  1 enema Rectal Once  ? Warfarin - Pharmacist Dosing Inpatient   Does not apply q1600  ? zinc sulfate  220 mg Oral Daily  ? ?Continuous Infusions: ? sodium chloride    ? sodium chloride    ? magnesium sulfate bolus IVPB    ? ?PRN Meds:.sodium chloride, acetaminophen **OR** acetaminophen, alum & mag hydroxide-simeth, guaiFENesin-dextromethorphan, hydrALAZINE, labetalol, magnesium sulfate bolus IVPB, metoprolol tartrate, morphine injection, ondansetron, phenol, potassium chloride, traMADol ? ? ?Allergies  ?Allergen Reactions  ? Sulfa Antibiotics   ?  Weak and dizziness  ? ?Review of Systems  ?Musculoskeletal:   ?     Left foot pain  ? ?Physical Exam ?Vitals reviewed.  ?Constitutional:   ?   Appearance: He is ill-appearing.  ?Pulmonary:  ?   Effort: Pulmonary effort is normal.  ?Musculoskeletal:  ?   Right Lower Extremity: Right leg is  amputated below knee.  ?Skin: ?   Comments: LLE bypass incision with staples ?Gangrenous left second toe  ?Neurological:  ?   Mental Status: He is lethargic.  ? ? ?Vital Signs: BP 139/63 (BP Location: Left Arm)   Pulse 89

## 2021-12-19 NOTE — Plan of Care (Signed)
  Problem: Education: Goal: Knowledge of General Education information will improve Description: Including pain rating scale, medication(s)/side effects and non-pharmacologic comfort measures Outcome: Progressing   Problem: Activity: Goal: Risk for activity intolerance will decrease Outcome: Progressing   Problem: Nutrition: Goal: Adequate nutrition will be maintained Outcome: Progressing   Problem: Coping: Goal: Level of anxiety will decrease Outcome: Progressing   

## 2021-12-19 NOTE — Progress Notes (Addendum)
? ?  Per Dr. Verita Lamb instruction I called and left a voice message for Palliative consult. ? ?Pending discharge home in 24-48 hours after discussion and plan made with Palliative assistance.  ? ? ?Mosetta Pigeon ?PA-C ?

## 2021-12-19 NOTE — Progress Notes (Signed)
Occupational Therapy Treatment ?Patient Details ?Name: Jose Bush ?MRN: [id number] ?DOB: [birth year] ?Today's Date: 12/19/2021 ? ? ?History of present illness Pt is an 81 y/o male who presented 12/06/21 for evaluation of L 1st and 2nd toe ulceration with hx of PVD. Pt underwent L popliteal artery to posterior tibial artery bypass and partial L second toe amputation 3/8 PWB on left heel.  3/12 returned to OR for bleeding. PMH: R BKA, a fib, BPH, PAD, PE, CVA, shingles, GERD, aortic stenosis. ?  ?OT comments ? Nursing informs OTA that patient appears more depressed due to current medical status with LLE. Patient was agreeable to OT treatment with sitting on EOB to address sitting balance.  Patient was min assist with getting to EOB and performed BUE theraband exercises to address BUE strengthening and sitting balance. Patient had limited sitting tolerance due to increased pain at LLE and was returned to supine. Possible OT to readdress goals next session due to current NWB status.   ? ?Recommendations for follow up therapy are one component of a multi-disciplinary discharge planning process, led by the attending physician.  Recommendations may be updated based on patient status, additional functional criteria and insurance authorization. ?   ?Follow Up Recommendations ? Acute inpatient rehab (3hours/day)  ?  ?Assistance Recommended at Discharge Frequent or constant Supervision/Assistance  ?Patient can return home with the following ? Two people to help with walking and/or transfers;Two people to help with bathing/dressing/bathroom ?  ?Equipment Recommendations ? Other (comment) (TBD)  ?  ?Recommendations for Other Services   ? ?  ?Precautions / Restrictions Precautions ?Precautions: Fall ?Precaution Comments: hx of R BKA with prosthetic, LLE now at risk per MD and NWB ?Required Braces or Orthoses: Other Brace ?Other Brace: L darco shoe (hold-currently NWB) ?Restrictions ?Weight Bearing Restrictions: Yes ?LLE Weight  Bearing: Non weight bearing ?Other Position/Activity Restrictions: Dr Lenell Antu note 3/20 updated to NWB  ? ? ?  ? ?Mobility Bed Mobility ?Overal bed mobility: Needs Assistance ?Bed Mobility: Rolling, Sidelying to Sit, Sit to Supine ?Rolling: Modified independent (Device/Increase time) ?Sidelying to sit: Min assist ?Supine to sit: Min assist ?  ?  ?General bed mobility comments: HOB up to get to EOB and assistance scooting hips forward ?  ? ?Transfers ?  ?  ?  ?  ?  ?  ?  ?  ?  ?  ?  ?  ?Balance Overall balance assessment: Needs assistance ?Sitting-balance support: Bilateral upper extremity supported, Feet supported ?Sitting balance-Leahy Scale: Fair ?Sitting balance - Comments: able to maintain sitting balance with patient requiring one extremity support at times due to pain ?  ?  ?  ?  ?  ?  ?  ?  ?  ?  ?  ?  ?  ?  ?  ?   ? ?ADL either performed or assessed with clinical judgement  ? ?ADL   ?  ?  ?  ?  ?  ?  ?  ?  ?  ?  ?  ?  ?  ?  ?  ?  ?  ?  ?  ?  ?  ? ?Extremity/Trunk Assessment Upper Extremity Assessment ?LUE Deficits / Details: hx of CVA with residual weakness, able to use functionally ?  ?  ?  ?  ?  ? ?Vision   ?  ?  ?Perception   ?  ?Praxis   ?  ? ?Cognition Arousal/Alertness: Awake/alert ?Behavior During Therapy: [facility] for tasks assessed/performed, Flat affect ?Overall  Cognitive Status: Impaired/Different from baseline ?Area of Impairment: Safety/judgement, Awareness, Problem solving ?  ?  ?  ?  ?  ?  ?  ?  ?  ?Current Attention Level: Selective ?  ?Following Commands: Follows one step commands consistently ?Safety/Judgement: Decreased awareness of deficits ?Awareness: Intellectual ?Problem Solving: Slow processing, Requires verbal cues ?General Comments: Patietn alert with flat affect. ?  ?  ?   ?Exercises Exercises: General Upper Extremity ?General Exercises - Upper Extremity ?Shoulder Flexion: Right, 10 reps, Seated, Theraband ?Theraband Level (Shoulder Flexion): Level 2 (Red) ?Shoulder ABduction:  Strengthening, Both, 10 reps, Seated, Theraband ?Theraband Level (Shoulder Abduction): Level 2 (Red) ?Elbow Flexion: Strengthening, Both, 10 reps, Seated, Theraband ?Theraband Level (Elbow Flexion): Level 2 (Red) ?Elbow Extension: Strengthening, Both, 10 reps, Seated, Theraband ?Theraband Level (Elbow Extension): Level 2 (Red) ? ?  ?Shoulder Instructions   ? ? ?  ?General Comments    ? ? ?Pertinent Vitals/ Pain       Pain Assessment ?Pain Assessment: Faces ?Faces Pain Scale: Hurts even more ?Pain Location: LLE and intermittent hip "spasms" ?Pain Descriptors / Indicators: Discomfort, Guarding, Spasm, Sore, Shooting ?Pain Intervention(s): Limited activity within patient's tolerance, Monitored during session, Premedicated before session, Repositioned ? ?Home Living   ?  ?  ?  ?  ?  ?  ?  ?  ?  ?  ?  ?  ?  ?  ?  ?  ?  ?  ? ?  ?Prior Functioning/Environment    ?  ?  ?  ?   ? ?Frequency ? Min 2X/week  ? ? ? ? ?  ?Progress Toward Goals ? ?OT Goals(current goals can now be found in the care plan section) ? Progress towards OT goals: Not progressing toward goals - comment (due to change in WB status for LLE) ? ?Acute Rehab OT Goals ?Patient Stated Goal: go home ?OT Goal Formulation: With patient/family ?Time For Goal Achievement: 12/21/21 ?Potential to Achieve Goals: Good ?ADL Goals ?Pt Will Perform Lower Body Bathing: with min assist;sitting/lateral leans;sit to/from stand ?Pt Will Transfer to Toilet: with min assist;ambulating ?Pt Will Perform Toileting - Clothing Manipulation and hygiene: with min assist;sitting/lateral leans;sit to/from stand  ?Plan Discharge plan remains appropriate   ? ?Co-evaluation ? ? ?   ?  ?  ?  ?  ? ?  ?AM-PAC OT "6 Clicks" Daily Activity     ?Outcome Measure ? ? Help from another person eating meals?: A Little ?Help from another person taking care of personal grooming?: A Little ?Help from another person toileting, which includes using toliet, bedpan, or urinal?: A Lot ?Help from another person  bathing (including washing, rinsing, drying)?: A Lot ?Help from another person to put on and taking off regular upper body clothing?: A Lot ?Help from another person to put on and taking off regular lower body clothing?: A Lot ?6 Click Score: 14 ? ?  ?End of Session   ? ?OT Visit Diagnosis: Unsteadiness on feet (R26.81);Other abnormalities of gait and mobility (R26.89);Muscle weakness (generalized) (M62.81) ?  ?Activity Tolerance Patient limited by pain ?  ?Patient Left in chair;with call bell/phone within reach;with family/visitor present ?  ?Nurse Communication Mobility status;Weight bearing status ?  ? ?   ? ?Time: [id number] ?OT Time Calculation (min): 22 min ? ?Charges: OT General Charges ?$OT Visit: 1 Visit ?OT Treatments ?$Therapeutic Activity: 8-22 mins ? ?Lodema Hong, OTA ?Acute Rehabilitation Services  ?Pager [phone] ?Office [phone] ? ? ?[place] ?12/19/2021, 12:00 PM ?

## 2021-12-19 NOTE — Care Management Important Message (Signed)
Important Message ? ?Patient Details  ?Name: Jose Bush ?MRN: [id number] ?Date of Birth: 25-Oct-1940 ? ? ?Medicare Important Message Given:  Yes ? ? ? ? ?[name] ?12/19/2021, 8:13 AM ?

## 2021-12-19 NOTE — Progress Notes (Signed)
ANTICOAGULATION CONSULT NOTE  ? ?Pharmacy Consult for Warfarin ?Indication: DVT and stroke ? ?Allergies  ?Allergen Reactions  ? Sulfa Antibiotics   ?  Weak and dizziness  ? ? ?Patient Measurements: ?Height: 5\' 8"  (172.7 cm) ?Weight: 91.9 kg (202 lb 9.6 oz) ?IBW/kg (Calculated) : 68.4 ? ?Vital Signs: ?Temp: 98.4 ?F (36.9 ?C) (03/21 0739) ?Temp Source: Oral (03/21 0739) ?BP: 131/62 (03/21 0739) ?Pulse Rate: 85 (03/21 0739) ? ?Labs: ?Recent Labs  ?  12/17/21 ?0110 12/18/21 ?0406 12/19/21 ?[id number]  ?LABPROT 20.4* 25.6* 25.6*  ?INR 1.7* 2.3* 2.3*  ? ? ? ?Estimated Creatinine Clearance: 79.7 mL/min (by C-G formula based on SCr of 0.73 mg/dL). ? ? ?Assessment: ?81 yo male who is now s/p tibial artery vein fistula bypass w/ repair 3/12. Pt was on warfarin PTA for a hx PAD/CVA/DVT. Pharmacy consulted to resume warfarin with no bridge. INR subtherapeutic on admission as warfarin had been held for procedure. ? ?Prior to admission warfarin dose was 4mg  daily.  ? ?INR now up to therapeutic (2.3). Hgb 8s - low but stable. Platelet Count okay.  ? ?Goal of Therapy:  ?INR 2-3 ?Monitor platelets by anticoagulation protocol: Yes ?  ?Plan:  ?Hold Coumadin for now per VVS notes. ?Will start therapeutic Lovenox once INR drops < 2. ? ?[place] Crane, Pharm D, BCPS, BCCP ?Clinical Pharmacist ? 12/19/2021 9:30 AM  ? ?[facility] pharmacy phone numbers are listed on [email or web address] ? ? ? ?

## 2021-12-20 DIAGNOSIS — M79605 Pain in left leg: Secondary | ICD-10-CM

## 2021-12-20 DIAGNOSIS — I70249 Atherosclerosis of native arteries of left leg with ulceration of unspecified site: Secondary | ICD-10-CM

## 2021-12-20 DIAGNOSIS — I70229 Atherosclerosis of native arteries of extremities with rest pain, unspecified extremity: Secondary | ICD-10-CM | POA: Diagnosis not present

## 2021-12-20 DIAGNOSIS — Z66 Do not resuscitate: Secondary | ICD-10-CM

## 2021-12-20 DIAGNOSIS — Z7189 Other specified counseling: Secondary | ICD-10-CM

## 2021-12-20 DIAGNOSIS — Z515 Encounter for palliative care: Secondary | ICD-10-CM | POA: Diagnosis not present

## 2021-12-20 LAB — PROTIME-INR
INR: 2.2 — ABNORMAL HIGH (ref 0.8–1.2)
Prothrombin Time: 24.6 seconds — ABNORMAL HIGH (ref 11.4–15.2)

## 2021-12-20 MED ORDER — MORPHINE SULFATE (CONCENTRATE) 10 MG/0.5ML PO SOLN: 5.0000 mg | ORAL | 0 refills | Status: AC | PRN

## 2021-12-20 NOTE — Progress Notes (Addendum)
Physical Therapy Treatment ?Patient Details ?Name: Jose Bush ?MRN: [id number] ?DOB: [birth year] ?Today's Date: 12/20/2021 ? ? ?History of Present Illness Pt is an 81 y/o male who presented 12/06/21 for evaluation of L 1st and 2nd toe ulceration with hx of PVD. Pt underwent L popliteal artery to posterior tibial artery bypass and partial L second toe amputation 3/8 PWB on left heel.  3/12 returned to OR for bleeding. PMH: R BKA, a fib, BPH, PAD, PE, CVA, shingles, GERD, aortic stenosis. ? ?  ?PT Comments  ? ? Focused session on transfer training for pt and his wife as they now plan to d/c home with hospice. Educated them on performing and assisting with scooting anteriorly to EOB and scooting laterally using sliding board to chair, needing TA this date. Also, educated them on positioning of hoyer lift pad and use of lift with +2 for safer transfers at home. Discussed DME needs, see equipment recommendations below. They verbalized understanding of education and reported no further questions for PT at this time. Will continue to follow acutely. Goals updated. ?  ?Recommendations for follow up therapy are one component of a multi-disciplinary discharge planning process, led by the attending physician.  Recommendations may be updated based on patient status, additional functional criteria and insurance authorization. ? ?Follow Up Recommendations ? Skilled nursing-short term rehab (<3 hours/day) (deciding to go home with hospice though) ?  ?  ?Assistance Recommended at Discharge Frequent or constant Supervision/Assistance  ?Patient can return home with the following Two people to help with walking and/or transfers;Assistance with cooking/housework;Direct supervision/assist for medications management;Direct supervision/assist for financial management;Assist for transportation;Help with stairs or ramp for entrance;A lot of help with bathing/dressing/bathroom ?  ?Equipment Recommendations ? BSC/3in1 (with drop-arm on  bedside commode; sliding board; may need different hoyer lift and pad)  ?  ?Recommendations for Other Services   ? ? ?  ?Precautions / Restrictions Precautions ?Precautions: Fall ?Precaution Comments: hx of R BKA with prosthetic, LLE now at risk per MD and NWB ?Required Braces or Orthoses: Other Brace ?Other Brace: L darco shoe (hold-currently NWB) ?Restrictions ?Weight Bearing Restrictions: Yes ?LLE Weight Bearing: Non weight bearing ?Other Position/Activity Restrictions: Dr Stanford Breed note 3/20 updated to NWB  ?  ? ?Mobility ? Bed Mobility ?  ?  ?  ?  ?  ?  ?  ?General bed mobility comments: Pt sitting up EOB with wife upon arrival, struggling to keep his balance to eat his food sitting EOB unsupported. ?  ? ?Transfers ?Overall transfer level: Needs assistance ?Equipment used: Sliding board ?Transfers: Bed to chair/wheelchair/BSC ?  ?  ?  ?  ?  ? Lateral/Scoot Transfers: Total assist, With slide board, From elevated surface ?General transfer comment: Cues provided for pt to lean laterally and scoot either hip anteriorly to get to EOB for improved stability when donning R prosthesis. Cues provided to lean laterally to R to dependently place sliding board under his L buttocks. Cues provided not to place fingers under board, keep L foot off ground (needing support by PT), and to lean anteriorly and push through R prosthesis like about to stand and push through UEs to scoot laterally to L. Pt ultimately unable to scoot and needing TA with use of bed pad under buttocks to scoot into chair. Educated pt's wife throughout on how a hoyer lift would be placed in supine, how to remove hoyer pad when in sitting, +2 assistance to manage pt with short distance transfers between surfaces using hoyer lift, how  to assist pt with scooting, and how to use sliding board and safe placement of furniture during transfer. ?  ? ?Ambulation/Gait ?  ?  ?  ?  ?  ?  ?  ?General Gait Details: unable ? ? ?Stairs ?  ?  ?  ?  ?  ? ? ?Wheelchair  Mobility ?  ? ?Modified Rankin (Stroke Patients Only) ?  ? ? ?  ?Balance Overall balance assessment: Needs assistance ?Sitting-balance support: Single extremity supported, Bilateral upper extremity supported, Feet supported ?Sitting balance-Leahy Scale: Poor ?Sitting balance - Comments: Pt with posterior lean, with LOB intermittently needing minA to recover, reliant on UE support. ?Postural control: Posterior lean ?  ?  ?Standing balance comment: unable ?  ?  ?  ?  ?  ?  ?  ?  ?  ?  ?  ?  ? ?  ?Cognition Arousal/Alertness: Awake/alert ?Behavior During Therapy: [facility] for tasks assessed/performed, Flat affect ?Overall Cognitive Status: Impaired/Different from baseline ?Area of Impairment: Safety/judgement, Awareness, Problem solving, Attention, Following commands ?  ?  ?  ?  ?  ?  ?  ?  ?  ?Current Attention Level: Selective ?  ?Following Commands: Follows one step commands consistently, Follows one step commands with increased time ?Safety/Judgement: Decreased awareness of safety, Decreased awareness of deficits ?Awareness: Emergent ?Problem Solving: Slow processing, Requires verbal cues, Difficulty sequencing, Requires tactile cues ?General Comments: Pt with slow processing, needing extra time and repeated multi-modal simple cues to sequence all tasks. Educated pt's wife throughout session on how to manage pt and use of DME. ?  ?  ? ?  ?Exercises   ? ?  ?General Comments General comments (skin integrity, edema, etc.): educated pt and pt's wife on sliding board and hoyer lift transfers; discussed DME needs ?  ?  ? ?Pertinent Vitals/Pain Pain Assessment ?Pain Assessment: Faces ?Faces Pain Scale: Hurts even more ?Pain Location: L leg ?Pain Descriptors / Indicators: Discomfort, Grimacing, Guarding, Moaning ?Pain Intervention(s): Limited activity within patient's tolerance, Monitored during session, Repositioned  ? ? ?Home Living   ?  ?  ?  ?  ?  ?  ?  ?  ?  ?   ?  ?Prior Function    ?  ?  ?   ? ?PT Goals (current goals  can now be found in the care plan section) Acute Rehab PT Goals ?Patient Stated Goal: to go home today ?PT Goal Formulation: With patient/family ?Time For Goal Achievement: 12/27/21 ?Potential to Achieve Goals: Fair ?Progress towards PT goals: Progressing toward goals (slowly) ? ?  ?Frequency ? ? ? Min 3X/week ? ? ? ?  ?PT Plan Equipment recommendations need to be updated  ? ? ?Co-evaluation   ?  ?  ?  ?  ? ?  ?AM-PAC PT "6 Clicks" Mobility   ?Outcome Measure ? Help needed turning from your back to your side while in a flat bed without using bedrails?: A Little ?Help needed moving from lying on your back to sitting on the side of a flat bed without using bedrails?: A Lot ?Help needed moving to and from a bed to a chair (including a wheelchair)?: Total ?Help needed standing up from a chair using your arms (e.g., wheelchair or bedside chair)?: Total ?Help needed to walk in hospital room?: Total ?Help needed climbing 3-5 steps with a railing? : Total ?6 Click Score: 9 ? ?  ?End of Session   ?Activity Tolerance: Patient tolerated treatment well ?Patient left: in  chair;with call bell/phone within reach;with chair alarm set;with family/visitor present ?Nurse Communication: Mobility status;Need for lift equipment ?PT Visit Diagnosis: Unsteadiness on feet (R26.81);Muscle weakness (generalized) (M62.81);History of falling (Z91.81);Difficulty in walking, not elsewhere classified (R26.2);Pain ?Pain - Right/Left: Left ?Pain - part of body: Ankle and joints of foot ?  ? ? ?Time: [id number] ?PT Time Calculation (min) (ACUTE ONLY): 27 min ? ?Charges:  $Therapeutic Activity: 23-37 mins          ?          ? ?Moishe Spice, PT, DPT ?Acute Rehabilitation Services  ?Pager: [phone] ?Office: [phone] ? ? ? ?[name] ?12/20/2021, 8:46 AM ? ?

## 2021-12-20 NOTE — Progress Notes (Signed)
? ?                                                                                                                                                     ?                                                   ?Daily Progress Note  ? ?Patient Name: Jose Bush       Date: 12/20/2021 ?DOB: [birth year]  Age: 81 y.o. MRN#: [id number] ?Attending Physician: Leonie Douglas, MD ?Primary Care Physician: Lianne Moris, PA-C ?Admit Date: 12/06/2021 ? ?Reason for Consultation/Follow-up: symptom management, goals of care, disposition ? ?HPI/Patient Profile: 81 y.o. male  with past medical history of peripheral artery disease, right BKA in 2019, atrial fibrillation, history of DVT and PE, history of stroke. He presented  on 12/06/2021 for evaluation of left 1st and 2nd toe ulceration. Admitted to vascular surgery.  ?3/8 - He underwent left popliteal artery to posterior tibial artery bypass and partial left second toe amputation.  ?3/12 - He returned to the OR for repair of left popliteal to posterior tibial artery vein fistula bypass.   ?  ?PMT was asked to assist with goals of care in the setting of increasing left LE pain and no further revascularization options available.  ? ?Subjective: ?Chart reviewed. Per MAR, he has not received any IV morphine since 1246 yesterday. He has received a todal of 35 mg oral morphine in the past 24 hours.  ? ?I went to see patient at bedside. He is OOB to the recliner. He is alert and states his pain is well controlled on the oral morphine. He and his wife are anxious to go home as soon as possible. Discussed that he will need an Rx for the oral morphine to continue at home.  ? ?Plan of care discussed with Dr. Lenell Antu, [facility] team, and liaison with [facility].  ? ? ?Objective: ? ?Physical Exam ?Vitals reviewed.  ?Constitutional:   ?   General: He is not in acute distress. ?   Appearance: He is ill-appearing.  ?Pulmonary:  ?   Effort: Pulmonary effort is normal.  ?Musculoskeletal:  ?   Right  Lower Extremity: Right leg is amputated below knee.  ?Neurological:  ?   Mental Status: He is alert and oriented to person, place, and time.  ?         ? ?Vital Signs: BP (!) 127/57 (BP Location: Left Arm)   Pulse 84   Temp 99.2 ?F (37.3 ?C) (Oral)   Resp 19   Ht 5\' 8"  (1.727 m)   Wt 91.9 kg   SpO2 97%  BMI 30.81 kg/m?  ?SpO2: SpO2: 97 % ?O2 Device: O2 Device: Room Air ? ? ? ?LBM: Last BM Date : 12/17/21 ?Baseline Weight: Weight: 90.7 kg ?Most recent weight: Weight: 91.9 kg ? ?     ?Palliative Assessment/Data: PPS 40% ? ? ? ? ? ?Palliative Care Assessment & Plan  ? ?Assessment: ?81 year old male with severe PAD status post left below knee popliteal artery to left posterior tibial artery vein fistula bypass and  left second toe partial amputation. There are no further revascularization options. His only surgical option is left LE AKA. He has decided he does not want this.  ?  ?Recommendations/Plan: ?DNR/DNI - Gold form signed and placed on chart ?Pending discharge home with hospice ?Rx needed at discharge - morphine concentrate solution 10 mg/0.5 ml; take 5-10 mg every 3 hours as needed for moderate to severe pain ? ? ?Prognosis: ? < 6 months ? ?Discharge Planning: ?Home with Hospice ? ? ?Thank you for allowing the Palliative Medicine Team to assist in the care of this patient. ? ?Time: 38 minutes ?   ?Greater than 50%  of this time was spent counseling and coordinating care related to the above assessment and plan. ? ?Lavena Bullion, NP ? ?Please contact Palliative Medicine Team phone at [phone] for questions and concerns.  ? ? ? ? ? ?

## 2021-12-20 NOTE — TOC Transition Note (Signed)
Transition of Care (TOC) - CM/SW Discharge Note ?Marvetta Gibbons Therapist, sports, BSN ?Transitions of Care ?Unit 4E- RN Case Manager ?See Treatment Team for direct phone #  ? ? ?Patient Details  ?Name: Jose Bush ?MRN: [id number] ?Date of Birth: July 29, 1941 ? ?Transition of Care (TOC) CM/SW Contact:  ?Dahlia Client, Romeo Rabon, RN ?Phone Number: ?12/20/2021, 10:49 AM ? ? ?Clinical Narrative:    ?CM has spoken with Cassandra at [facility] this am ([phone]) and confirmed referral has been received and reviewed. Per Cassandra she has spoken with pt's wife this am as well and reviewed DME needs. Cassandra will order DME today with [employer]. Pt does not need to wait for DME to be delivered before transporting home. Cassandra request scripts be sent to [facility] as well. Hospice will plan to see pt in the home later today for Hospice admission.  ? ?Pt will transport via [facility]- address has been confirmed with pt and wife at the bedside.  ?CM faxed pain med script to [phone] gave paper copy to wife.  ? ?[facility] called for transport at 1030am, per [facility] they estimate they will be here within the hour to transport pt home. Paperwork along with GOLD DNR placed on chart.  ?Bedside RN, pt and wife all updated on transportation timeframe.  ? ? ? ? ?Final next level of care: [place] ?Barriers to Discharge: Barriers Resolved ? ? ?Patient Goals and CMS Choice ?Patient states their goals for this hospitalization and ongoing recovery are:: met with PC and has decided to return home with Home Hospice services ?CMS Medicare.gov Compare Post Acute Care list provided to:: Patient ?Choice offered to / list presented to : Patient, Spouse ? ?Discharge Placement ?  ?           ?  ? Home w/ Hospice.  ?  ?  ? ?Discharge Plan and Services ?In-house Referral: Clinical Social Work ?Discharge Planning Services: CM Consult ?Post Acute Care Choice: Hospice, [place], IP Rehab          ?DME  Arranged: 3-N-1, Hospice Equipment Package Others ?DME Agency: [place] ?Date DME Agency Contacted: 12/20/21 ?  ?Representative spoke with at DME Agency: DME to be arranged by [facility] per [place] ?  ?[place] Agency: [facility] ?Date [place]: 12/19/21 ?Time [place]: 6553 ?Representative spoke with at [place]: [place] with Vito Backers this am 12/20/21 ? ?Social Determinants of Health (SDOH) Interventions ?  ? ? ?Readmission Risk Interventions ? ?  12/19/2021  ?  4:24 PM  ?Readmission Risk Prevention Plan  ?Transportation Screening Complete  ?PCP or Specialist Appt within 5-7 Days Complete  ?Home Care Screening Complete  ?Medication Review (RN CM) Complete  ? ? ? ? ? ?

## 2021-12-20 NOTE — Progress Notes (Addendum)
[facility] [place] ? ?Subjective  - Pain in the left foot.  Wants to go home for now with Hospice care  ? ? ?Objective ?133/61 ?82 ?100 ?F (37.8 ?C) (Oral) ?19 ?93% ? ?Intake/Output Summary (Last 24 hours) at 12/20/2021 0726 ?Last data filed at 12/19/2021 2030 ?Gross per 24 hour  ?Intake 340 ml  ?Output 1150 ml  ?Net -810 ml  ? ? ?Left DP doppler intact, left medial ankle incision with separation.  Minimal drainage on old dressing.  No active drainage or erythema.  Non healing surgical wound.   ?Leg incision are healing well without signs of infection. ?Minimal left foot edema ?Lungs non labored breathing ? ?Assessment/Planning: ? 81 y.o. male is s/p  ?1) left greater saphenous vein harvest ?2) creation of left posterior tibial artery and vein arteriovenous fistula ?3) left below knee popliteal artery to left posterior tibial artery vein fistula bypass ?4) left second toe partial amputation  ? ?Deteriorating left medial ankle incision.  Patent inflow with DP doppler signal.  Motor intact.  Staples intact.  Wife was asking about when they should come out.  I will maintain staples for now.   ?Tm 100, INR 2.2 ? ? ?Plan for discharge home with Morphine oral solution for pain control.  Hospice care to manage at home.   ?F/U with Dr. Stanford Breed in 2 weeks for wound check.  His only surgical option is AKA on the left LE.   ? ? ?Roxy Horseman ?12/20/2021 ?7:26 AM ?-- ? ?Laboratory ?Lab Results: ?No results for input(s): WBC, HGB, HCT, PLT in the last 72 hours. ?BMET ?No results for input(s): NA, K, CL, CO2, GLUCOSE, BUN, CREATININE, CALCIUM in the last 72 hours. ? ?COAG ?Lab Results  ?Component Value Date  ? INR 2.2 (H) 12/20/2021  ? INR 2.3 (H) 12/19/2021  ? INR 2.3 (H) 12/18/2021  ? ?No results found for: PTT ? ? ? ?

## 2021-12-20 NOTE — Progress Notes (Signed)
Mobility Specialist Progress Note ? ? 12/20/21 1136  ?Mobility  ?Activity Transferred from chair to bed  ?Level of Assistance Maximum assist, patient does 25-49%  ?Assistive Device  ?(+3)  ?LLE Weight Bearing NWB  ?Activity Response Tolerated fair  ?$Mobility charge 1 Mobility  ? ?Pt received in chair needing to get to bed for d/c home. +3 A to provide pericare and lift pt to EOB. Pt presenting w/ dec strength in RLE upon standing. Tx'd to bed safely w/o fault. Left w/ NT in room.  ? ?Frederico Hamman ?Mobility Specialist ?Phone Number [phone] ? ?

## 2021-12-25 ENCOUNTER — Other Ambulatory Visit: Payer: Self-pay | Admitting: *Deleted

## 2021-12-25 DIAGNOSIS — I70249 Atherosclerosis of native arteries of left leg with ulceration of unspecified site: Secondary | ICD-10-CM

## 2021-12-25 DIAGNOSIS — I4891 Unspecified atrial fibrillation: Secondary | ICD-10-CM | POA: Diagnosis not present

## 2021-12-25 DIAGNOSIS — I739 Peripheral vascular disease, unspecified: Secondary | ICD-10-CM

## 2021-12-27 ENCOUNTER — Ambulatory Visit: Payer: Medicare HMO | Admitting: Vascular Surgery

## 2021-12-27 ENCOUNTER — Ambulatory Visit (INDEPENDENT_AMBULATORY_CARE_PROVIDER_SITE_OTHER): Payer: Medicare HMO | Admitting: Vascular Surgery

## 2021-12-27 ENCOUNTER — Other Ambulatory Visit: Payer: Self-pay

## 2021-12-27 ENCOUNTER — Encounter: Payer: Self-pay | Admitting: Vascular Surgery

## 2021-12-27 VITALS — BP 151/69 | HR 97 | Ht 68.0 in

## 2021-12-27 DIAGNOSIS — R0902 Hypoxemia: Secondary | ICD-10-CM | POA: Diagnosis not present

## 2021-12-27 DIAGNOSIS — R531 Weakness: Secondary | ICD-10-CM | POA: Diagnosis not present

## 2021-12-27 DIAGNOSIS — I70222 Atherosclerosis of native arteries of extremities with rest pain, left leg: Secondary | ICD-10-CM

## 2021-12-27 DIAGNOSIS — R279 Unspecified lack of coordination: Secondary | ICD-10-CM | POA: Diagnosis not present

## 2021-12-27 DIAGNOSIS — R5381 Other malaise: Secondary | ICD-10-CM | POA: Diagnosis not present

## 2021-12-27 DIAGNOSIS — Z743 Need for continuous supervision: Secondary | ICD-10-CM | POA: Diagnosis not present

## 2021-12-27 NOTE — Discharge Summary (Signed)
[facility] Discharge Summary ? ? ?Patient ID:  ?Jose Bush ?MRN: [id number] ?DOB/AGE: 82-Apr-1942 82 y.o. ? ?Admit date: 12/06/2021 ?Discharge date: 12/20/21 ?Date of Surgery: 12/10/2021 ?Surgeon: Surgeon(s): ?[name], MD ? ?Admission Diagnosis: ?Critical lower limb ischemia ([place]) [I70.229] ? ?Discharge Diagnoses:  ?Critical lower limb ischemia ([place]) [I70.229] ? ?Secondary Diagnoses: ?Past Medical History:  ?Diagnosis Date  ? Aortic stenosis   ? Aortic sclerosis without stenosis on echo 8/21  ? Arthritis   ? Atrial fibrillation ([place])   ? BPH (benign prostatic hyperplasia)   ? Essential hypertension   ? GERD (gastroesophageal reflux disease)   ? Heart murmur   ? History of DVT (deep vein thrombosis)   ? Positive anticardiolipin  ? History of hiatal hernia   ? History of stroke 2010  ? Lupus ([place])   ? PAD (peripheral artery disease) ([place])   ? Right below-knee amputation in 2019  ? Pulmonary embolism ([place])   ? hx of 10 years ago  ? Shingles   ? Stroke [facility])   ? Tinnitus   ? Wears dentures   ? Wears glasses   ? ? ?Procedure(s): ?LEFT LOWER EXTREMITY WOUND EXPLORATION, REPAIR OF PROXIMAL ANASTIMOSIS OF FEMORAL/TIBIAL BYPASS. ? ?Discharged Condition: poor ? ?HPI: Jose Bush is a 81 y.o. (31-Jul-1941) male who presents with non healing wounds on the left LE.  He has had previous vascular interventions in the past.   He underwent a right below-knee amputation in June 2019.  He underwent left lower extremity angiography with tibial angioplasty in April 2022 with Dr. Oneida Alar.  ? Dr. Stanford Breed Performed angiogram which demonstrated significant tibial disease Dr. Stanford Breed planned procedure for deep vein arterialization.  Due to his significant disease his only other option is amputation.  He elected to pursue revascularization.   ? ? ?Hospital Course:  ?Jose Bush is a 81 y.o. male is S/P left ?Procedure(s): ?LEFT LOWER EXTREMITY WOUND EXPLORATION, REPAIR OF PROXIMAL ANASTIMOSIS OF  FEMORAL/TIBIAL BYPASS. ?Post op he had a DP doppler signal. ?He was progressing and healing well until the medial left ankle incision dehisced and Dr. Stanford Breed recommended amputation verses palliative care.  The patient chose to go home with life goals in place.  Pending DPM toe amp in the future for dry gangrene.   ? Limb salvage attempt with distal target site wound dehiscence that has been present for a while.    Dr. Stanford Breed spoke with the patient and his wife.  Pending his exam today he may need to consider primary amputation.  The surrounding tissue at the PT incision is without erythema or edema.   ?No leukocytosis and he remains afebrile Tm 99.1.  His CC is pain.  ? His only option is left AKA.  He will f/u in 2 weeks or sooner if he needs [place] he will call our office.  ? ?Significant Diagnostic Studies: ?CBC ?Lab Results  ?Component Value Date  ? WBC 10.3 12/16/2021  ? HGB 8.1 (L) 12/16/2021  ? HCT 25.9 (L) 12/16/2021  ? MCV 87.5 12/16/2021  ? PLT 248 12/16/2021  ? ? ?BMET ?   ?Component Value Date/Time  ? NA 134 (L) 12/10/2021 1431  ? K 4.6 12/10/2021 1431  ? CL 105 12/07/2021 0224  ? CO2 25 12/07/2021 0224  ? GLUCOSE 117 (H) 12/07/2021 0224  ? BUN 12 12/07/2021 0224  ? CREATININE 0.73 12/07/2021 0224  ? CALCIUM 8.6 (L) 12/07/2021 0224  ? GFRNONAA >60 12/07/2021 0224  ? GFRAA >  60 08/10/2019 1536  ? ?COAG ?Lab Results  ?Component Value Date  ? INR 2.2 (H) 12/20/2021  ? INR 2.3 (H) 12/19/2021  ? INR 2.3 (H) 12/18/2021  ? ? ? ?Disposition:  ?Discharge to :Home ?Discharge Instructions   ? ? Call MD for:  redness, tenderness, or signs of infection (pain, swelling, bleeding, redness, odor or green/yellow discharge around incision site)   Complete by: As directed ?  ? Call MD for:  severe or increased pain, loss or decreased feeling  in affected limb(s)   Complete by: As directed ?  ? Call MD for:  temperature >100.5   Complete by: As directed ?  ? Discharge instructions   Complete by: As directed ?  ? May shower daily  as needed.  Dry dressing to left ankle if draining.  ? Resume previous diet   Complete by: As directed ?  ? ?  ? ?Allergies as of 12/20/2021   ? ?   Reactions  ? Sulfa Antibiotics   ? Weak and dizziness  ? ?  ? ?  ?Medication List  ?  ? ?TAKE these medications   ? ?acetaminophen 650 MG CR tablet ?Commonly known as: TYLENOL ?Take 650 mg by mouth in the morning and at bedtime. ?  ?docusate sodium 100 MG capsule ?Commonly known as: COLACE ?Take 100 mg by mouth in the morning. ?  ?ESTER C PO ?Take 500 mg by mouth daily as needed (immune support/health). ?  ?hydroxychloroquine 200 MG tablet ?Commonly known as: PLAQUENIL ?Take 200 mg by mouth in the morning. ?  ?ICY HOT EX ?Apply 1 application topically 3 (three) times daily as needed (pain.). ?  ?Krill Oil 500 MG Caps ?Take 500 mg by mouth in the morning. ?  ?lisinopril-hydrochlorothiazide 10-12.5 MG tablet ?Commonly known as: ZESTORETIC ?Take 1 tablet by mouth daily with breakfast. ?  ?loratadine 10 MG tablet ?Commonly known as: CLARITIN ?Take 10 mg by mouth in the morning. ?  ?melatonin 5 MG Tabs ?Take 10 mg by mouth at bedtime. ?  ?morphine CONCENTRATE 10 MG/0.5ML Soln concentrated solution ?Take 0.25-0.5 mLs (5-10 mg total) by mouth every 3 (three) hours as needed for severe pain or moderate pain. ?  ?omeprazole 20 MG capsule ?Commonly known as: PRILOSEC ?Take 20 mg by mouth daily. ?  ?predniSONE 5 MG tablet ?Commonly known as: DELTASONE ?Take 5 mg by mouth daily with breakfast. ?  ?warfarin 4 MG tablet ?Commonly known as: COUMADIN ?Take 4 mg by mouth every evening. ?  ? ?  ? ?Verbal and written Discharge instructions given to the patient. Wound care per Discharge AVS ? Follow-up Information   ? ? [name], MD Follow up in 2 week(s).   ?Specialties: Vascular Surgery, Interventional Cardiology ?Why: Office will call you to arrange your appt (sent) ?Contact information: ?[address] ?[place] [address] ?[phone] ? ? ?  ?  ? ? [place] Follow up.   ?Specialty: Hospice ?Why: Home Hospice arranged ?Contact information: ?[address] ?[place] ?[phone] ? ?  ?  ? ?  ?  ? ?  ? ? ?Signed: ?Roxy Horseman ?12/27/2021, 11:45 AM ?- For [facility] Registry use --- ?Instructions: Press F2 to tab through selections.  Delete question if not applicable.  ? ?Post-op:  ?Wound infection: no  ?Graft infection: Yes  ?Transfusion: Yes  If yes, 1 ? units given ?New Arrhythmia: No ?Ipsilateral amputation: [ ]  no, [ ]  Minor, [ x]  BKA, [ ]  AKA ?Discharge patency: [ x] Primary, [ ]  Primary assisted, [ ]  Secondary, [ ]  Occluded ?Patency judged by: [ x] Dopper only, [ ]  Palpable graft pulse, [ ]  Palpable distal pulse, [ ]  ABI inc. > 0.15, [ ]  Duplex ? ?D/C Ambulatory Status: Wheelchair ? ?Complications: ?MI: [x ] No, [ ]  Troponin only, [ ]  EKG or Clinical ?CHF: No ?Resp failure: [ x] none, [ ]  Pneumonia, [ ]  Ventilator ?Chg in renal function: [ ]  none, [ ]  Inc. Cr > 0.5, [ ]  Temp. Dialysis, [ ]  Permanent dialysis ?Stroke: [ x] None, [ ]  Minor, [ ]  Major ?Return to OR: No  ?Reason for return to OR: [ ]  Bleeding, [ ]  Infection, [ ]  Thrombosis, [ ]  Revision ? ?Discharge medications: ?Statin use:  No  for medical reason not indicated ?ASA use:  No  for medical reason not indicated ?Plavix use:  No  for medical reason not indicated ?Beta blocker use: No  for medical reason not indicated ?Coumadin use: Yes ? ?

## 2021-12-27 NOTE — Progress Notes (Signed)
? ?[facility] ? ?Patient name: Jose Bush MRN: [id number] DOB: [birth year] Sex: male ? ?REASON FOR VISIT: Follow-up recent hospitalization with attempted revascularization for left leg limb salvage ? ?HPI: ?Jose Bush is a 81 y.o. male presents today for follow-up.  His family requested that he be seen in our regional office due to transportation issues.  I reviewed his recent complicated course in the hospital.  He had presented with critical limb ischemia and gangrene of his toes of his left foot.  He does have a history of right below-knee amputation.  He underwent initial angioplasty of his posterior tibial and plantar arch on 12/01/2021.  He subsequently underwent attempted surgical revascularization with below-knee popliteal to posterior tibial bypass with adjuvant AV fistula creation.  He was taken back to the operating room on 12/10/2021 with a bleeding from his proximal vein.  He continued to have nonhealing wound in his foot and also had breakdown of his surgical wound on the medial aspect of his ankle.  It was recommended that he proceed with above-knee amputation.  The patient would not agree to this and eventually was discharged home with hospice and palliative care.  The patient is here today with his wife and son.  He does report significant persistent pain in his left foot.  He reports that this keeps him awake at night.  He is somewhat somnolent today.  He is brought in by EMS on a stretcher. ? ?Current Outpatient Medications  ?Medication Sig Dispense Refill  ? acetaminophen (TYLENOL) 650 MG CR tablet Take 650 mg by mouth in the morning and at bedtime.    ? Bioflavonoid Products (ESTER C PO) Take 500 mg by mouth daily as needed (immune support/health).    ? docusate sodium (COLACE) 100 MG capsule Take 100 mg by mouth in the morning.    ? hydroxychloroquine (PLAQUENIL) 200 MG tablet Take 200 mg by mouth in the morning.    ? Krill  Oil 500 MG CAPS Take 500 mg by mouth in the morning.    ? lisinopril-hydrochlorothiazide (PRINZIDE,ZESTORETIC) 10-12.5 MG per tablet Take 1 tablet by mouth daily with breakfast.     ? loratadine (CLARITIN) 10 MG tablet Take 10 mg by mouth in the morning.    ? melatonin 5 MG TABS Take 10 mg by mouth at bedtime.    ? Menthol, Topical Analgesic, (ICY HOT EX) Apply 1 application topically 3 (three) times daily as needed (pain.).    ? Morphine Sulfate (MORPHINE CONCENTRATE) 10 MG/0.5ML SOLN concentrated solution Take 0.25-0.5 mLs (5-10 mg total) by mouth every 3 (three) hours as needed for severe pain or moderate pain. 180 mL 0  ? omeprazole (PRILOSEC) 20 MG capsule Take 20 mg by mouth daily.    ? predniSONE (DELTASONE) 5 MG tablet Take 5 mg by mouth daily with breakfast.    ? warfarin (COUMADIN) 4 MG tablet Take 4 mg by mouth every evening.    ? ?No current facility-administered medications for this visit.  ? ? ? ?PHYSICAL EXAM: ?Vitals:  ? 12/27/21 0948  ?BP: (!) 151/69  ?Pulse: 97  ?Height: 5\' 8"  (1.727 m)  ? ? ?GENERAL: The patient is a well-nourished male, in no acute distress. The vital signs are documented above. ?His surgical incisions are healed with staples intact in his calf.  He has had further breakdown of his ankle wound with separation and necrosis of the skin.  He does have dry gangrene of his third toe. ? ?  MEDICAL ISSUES: ?Had long discussion with the patient, his wife and son.  I explained that again as was presented to him in the hospital, his only surgical option would be left above-knee amputation.  I did explain that it may take some time for his progressive tissue loss in his foot to take his life.  Also explained in my experience is much better option to proceed with left above-knee amputation with a several day hospitalization and return to home.  The family is asking about rehab following amputation and I explained he would certainly be no different than he is now with bed to chair and would not  be a rehab candidate. ? ?His wife is asking about staple removal today.  I explained that certainly more than happy to take the staples out of his calf but this would not make any bearing there is no problem with leaving the staples.  I explained that the thought would be that this is a legal problem with his foot that would take his life and that is what the palliative care only decision.  I did recommend proceeding with above-knee amputation.  They are not willing to do this currently.  We will continue to think about this and will notify our office if they wish to proceed with left above-knee amputation ? ? ?Rosetta Posner, MD FACS ?[facility] ?Office Tel [phone] ? ?Note: Portions of this report may have been transcribed using voice recognition software.  Every effort has been made to ensure accuracy; however, inadvertent computerized transcription errors may still be present. ?

## 2022-01-05 IMAGING — DX DG HIP (WITH OR WITHOUT PELVIS) 2-3V*L*
3 series · 4 of 4 positions shown · non-contrast
Comparison: X-ray dated January 23, 2018

CLINICAL DATA: Left hip pain

EXAM:
DG HIP (WITH OR WITHOUT PELVIS) 2-3V LEFT

[Series 1: pelvis ap · 0.14mm/px · 2 of 2 slices shown]
[im 1/2]
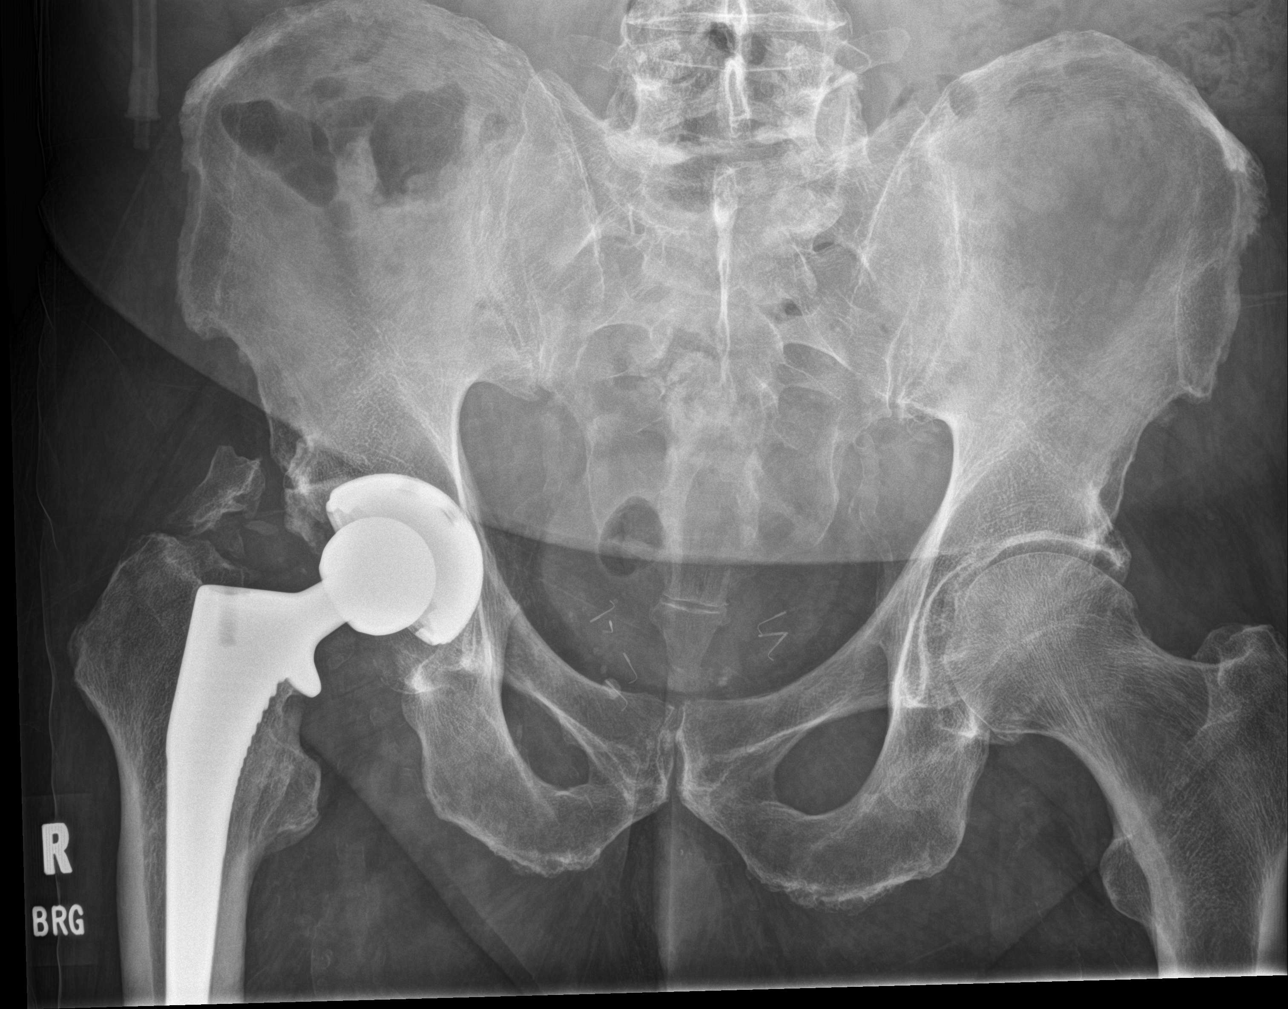
[im 2/2]
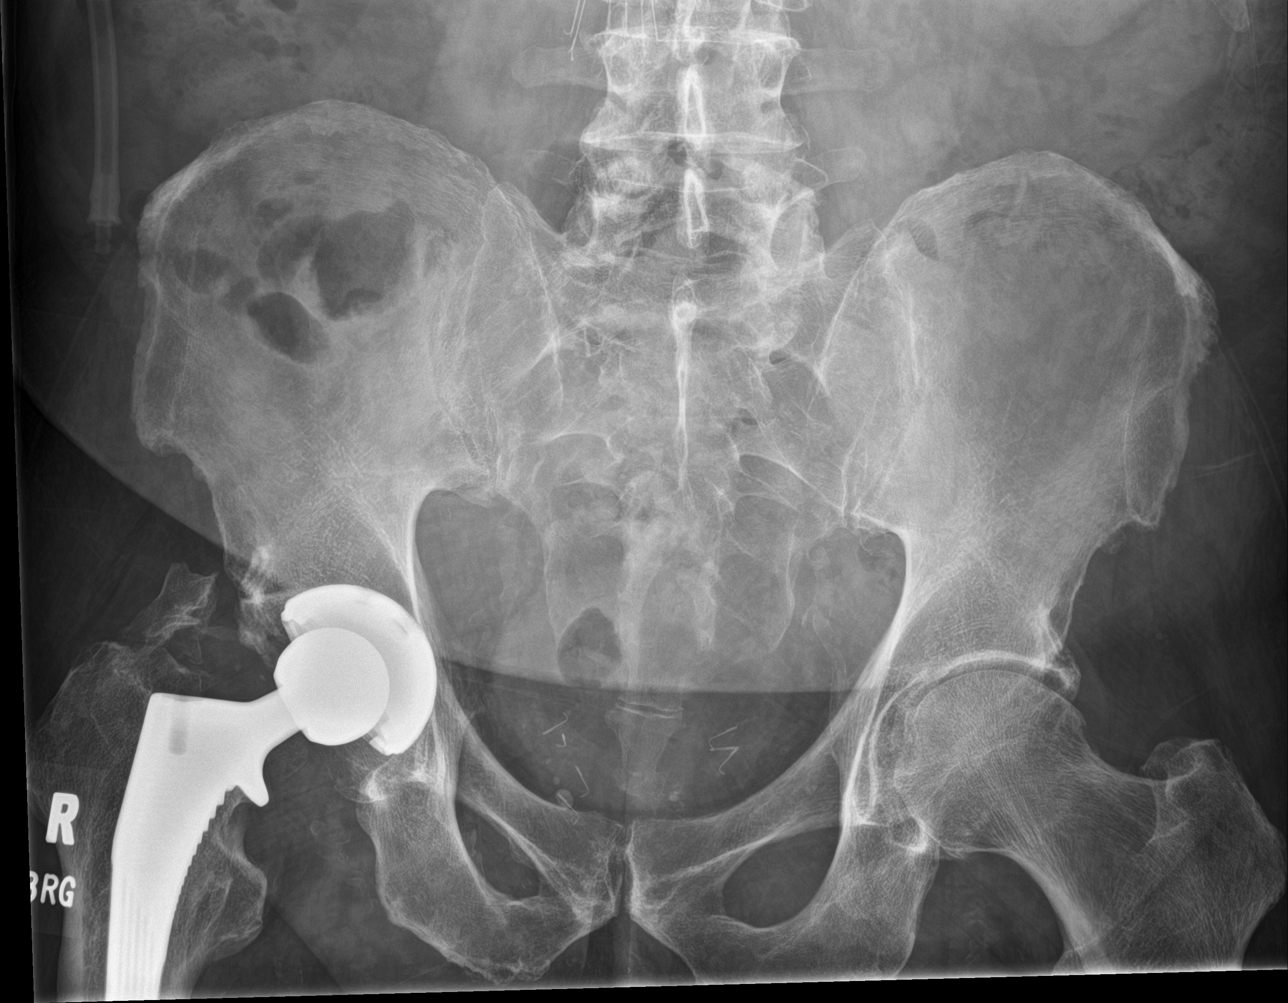

[hip ap]
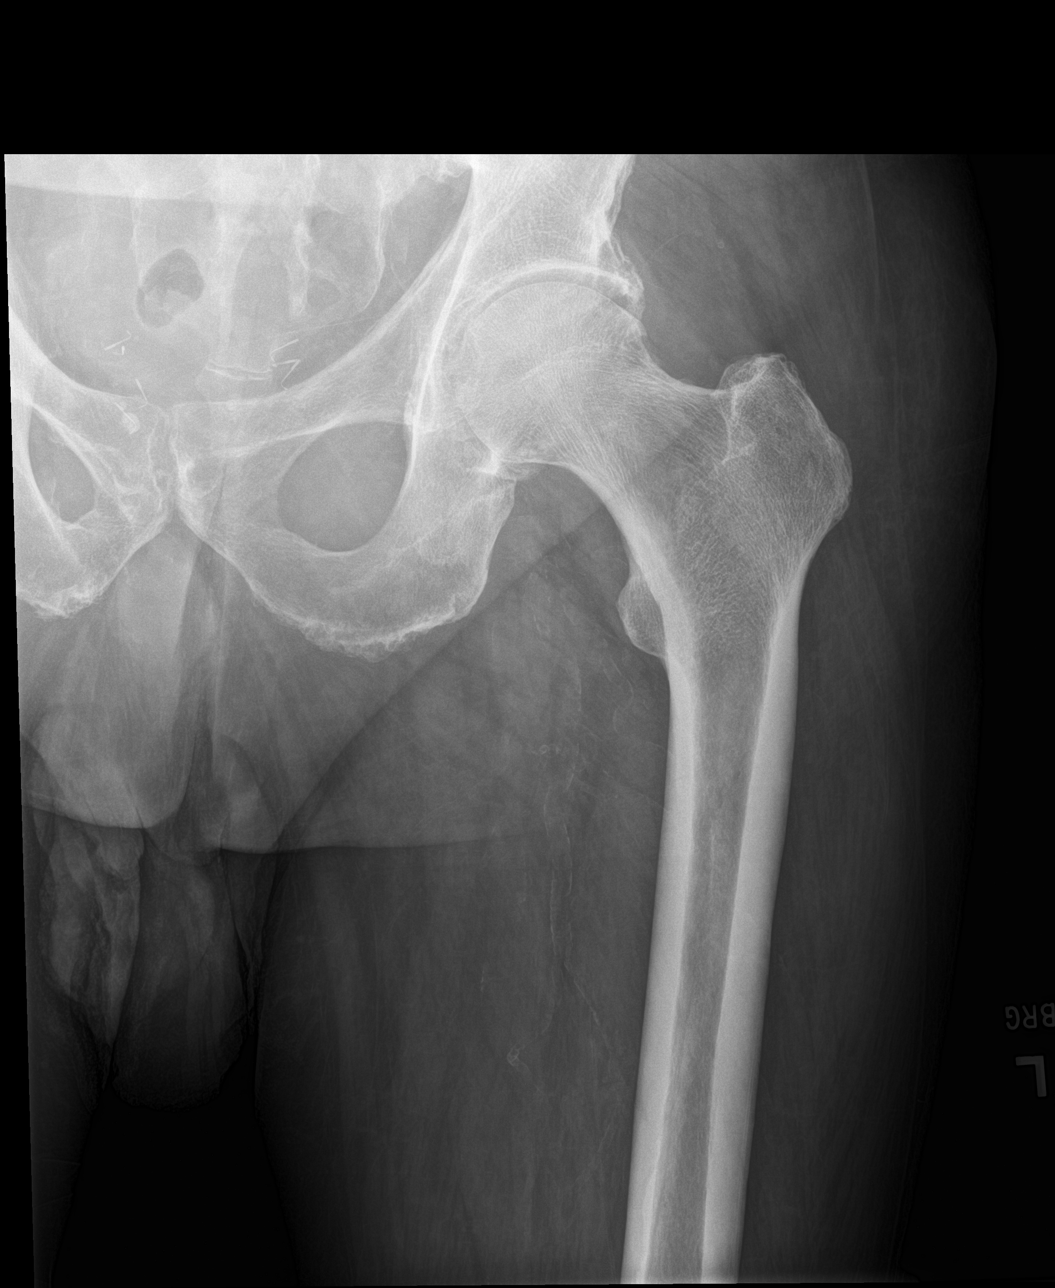

[hip lat]
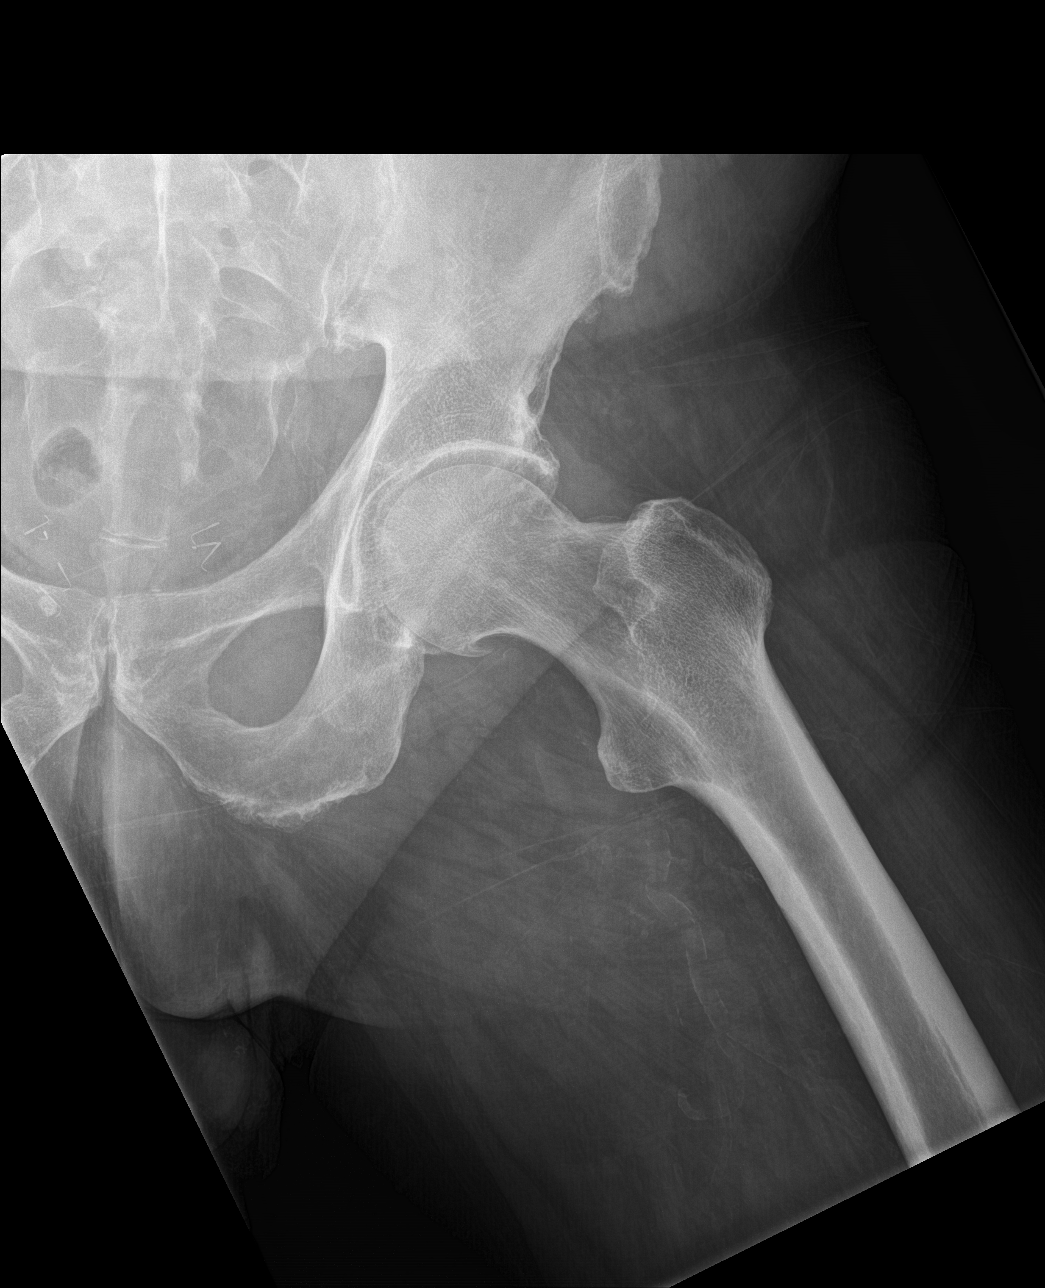

[4 of 4 positions shown; findings below may reference images not displayed]

FINDINGS: No evidence of acute fracture or dislocation. Moderate degenerative
changes of the left hip joint consisting of joint space loss and
subchondral sclerosis. Single AP view of the right hip demonstrates
total right hip arthroplasty.

Visualized sacrum is in tact. Mild degenerative changes of the
bilateral sacroiliac joints and pubic symphysis.

Vascular calcifications.
IMPRESSION: Moderate degenerative changes of the left hip.

## 2022-01-16 ENCOUNTER — Encounter (HOSPITAL_COMMUNITY): Payer: Medicare HMO

## 2022-01-23 ENCOUNTER — Encounter: Payer: Medicare HMO | Admitting: Vascular Surgery

## 2022-01-23 ENCOUNTER — Encounter (HOSPITAL_COMMUNITY): Payer: Medicare HMO

## 2022-01-29 DEATH — deceased

## 2022-03-27 ENCOUNTER — Ambulatory Visit: Payer: Medicare HMO | Admitting: Urology

## 2022-08-08 IMAGING — RF DG C-ARM 1-60 MIN
1 series · 8 of 8 positions shown · non-contrast
Comparison: None.

CLINICAL DATA: LEFT POSTERIOR TIBIAL VEIN ARTERIALIZATION WITH
GREATER SAPHENOUS VEIN HARVEST

EXAM:
DG C-ARM 1-60 MIN

[Series 1: run · 2 acquisitions, 8 frames shown]
[im 1/2]
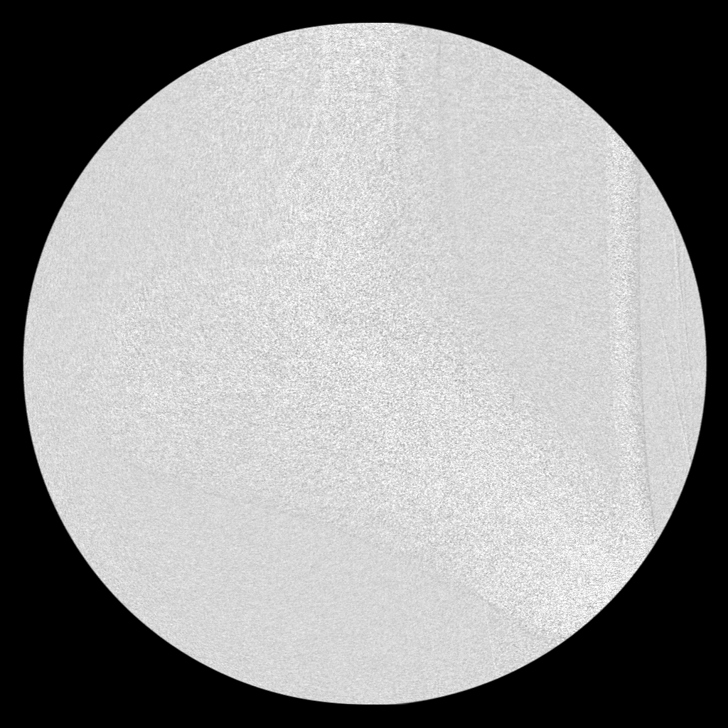
[im 1/2]
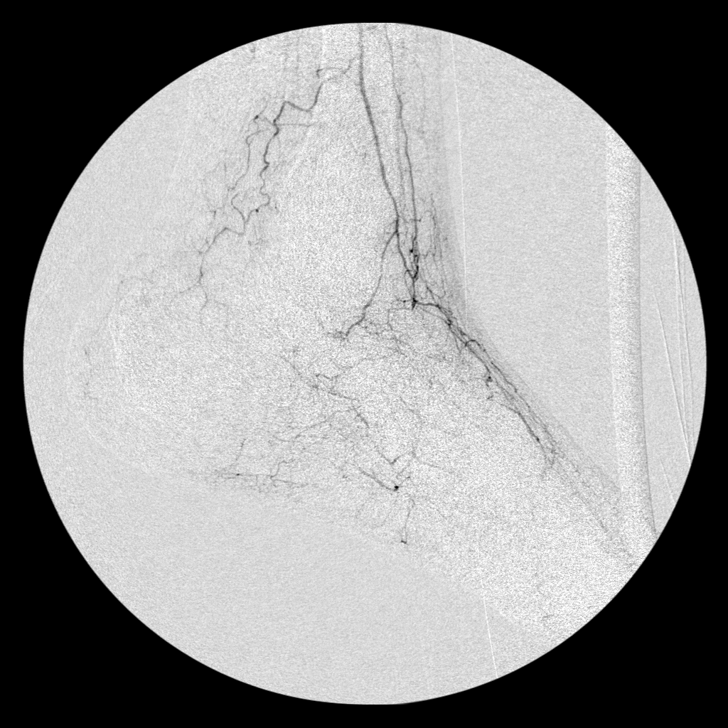
[im 1/2]
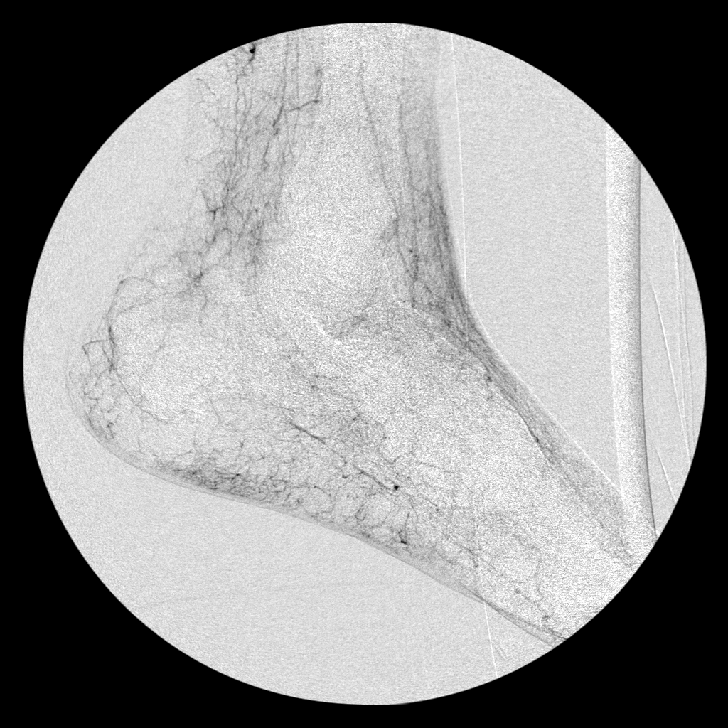
[im 1/2]
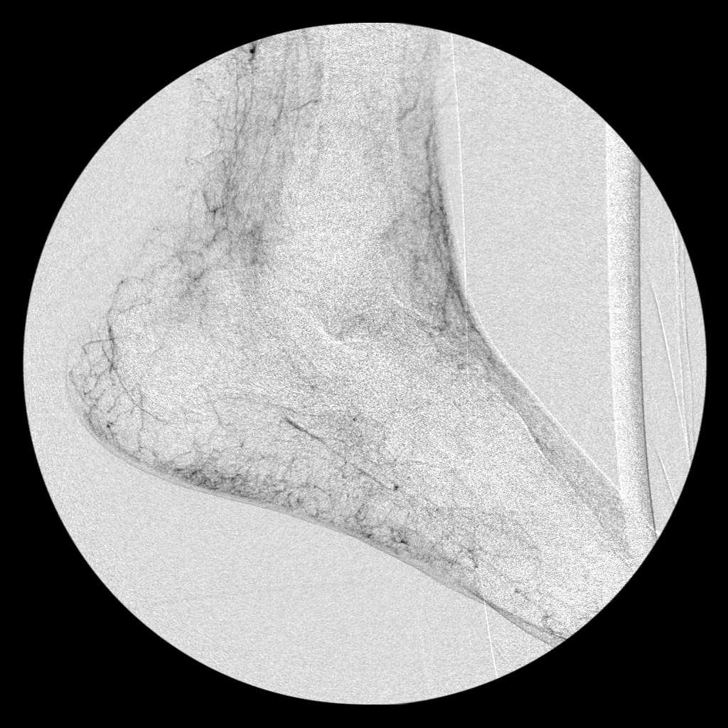
[im 2/2]
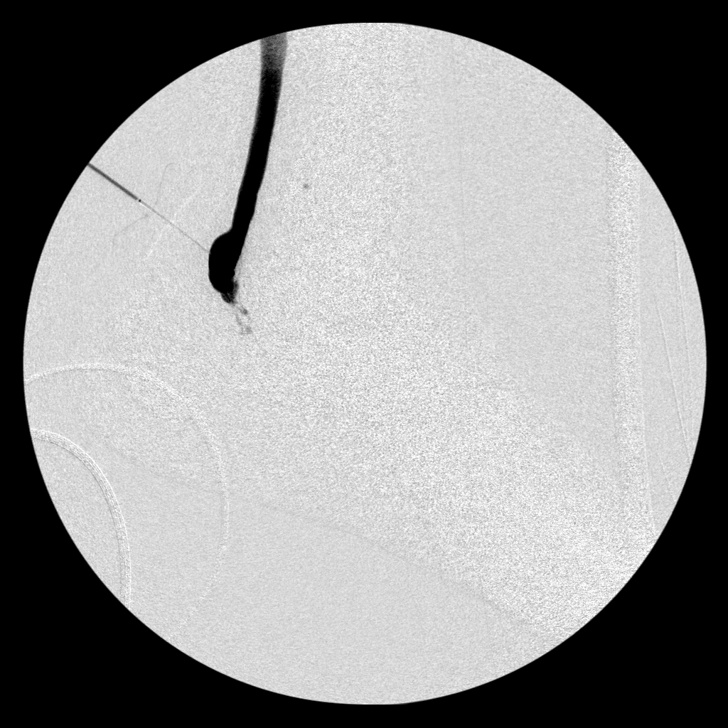
[im 2/2]
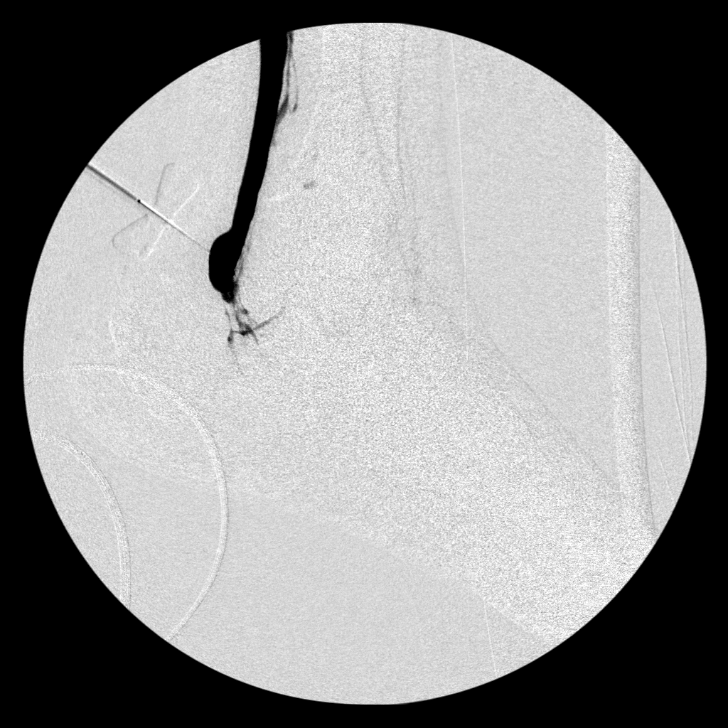
[im 2/2]
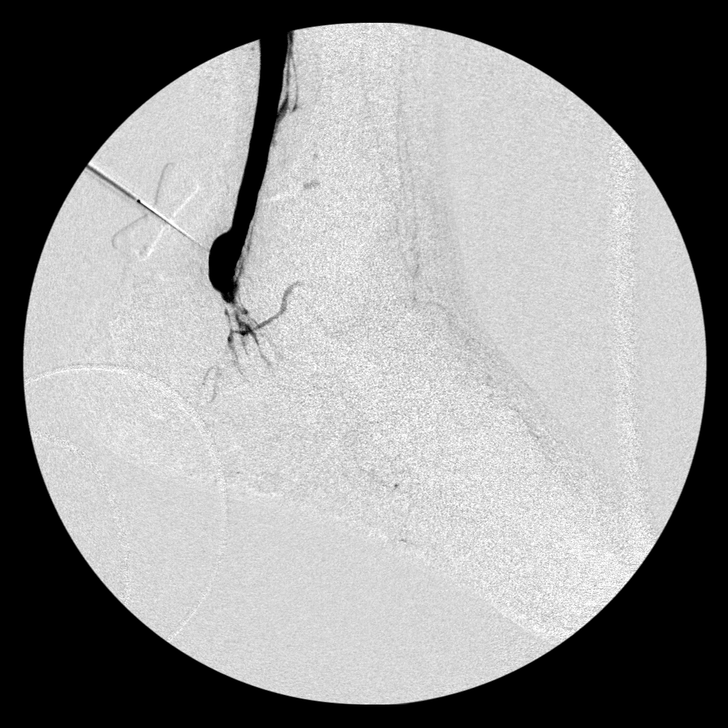
[im 2/2]
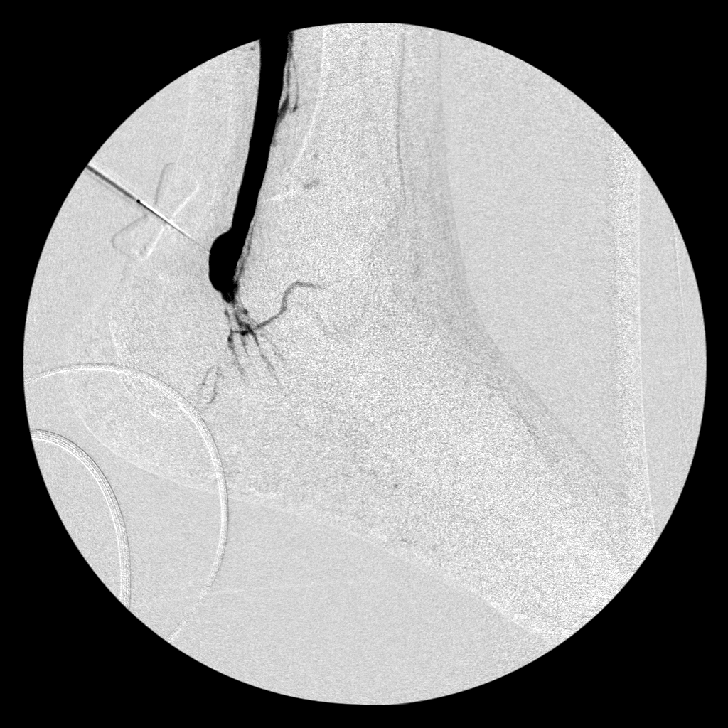

[8 of 8 positions shown; findings below may reference images not displayed]

FLUOROSCOPY:
Fluoroscopy time: 43 seconds

Exposure Index (as provided by the fluoroscopic device): 0.81 mGy
Kerma
FINDINGS: Multiple, limited lateral planar images of the (reported) LEFT lower
extremity obtained C-arm

Images demonstrating distal LEFT lower extremity arteriogram and
venogram focused at the ankle.

Digital subtraction angiogram with diminutive LEFT ATA with absent
PTA.

Additional DSA venogram showing direct puncture of the LEFT GSV at
the malleolus.
IMPRESSION: Fluoroscopic imaging for distal LEFT lower extremity
revascularization, as above.

For complete description of intra procedural findings, please see
performing service dictation.
